# Patient Record
Sex: Female | Born: 1950 | Race: White | Hispanic: No | Smoking: Never smoker
Health system: Southern US, Community
[De-identification: ages and names within clinical notes are randomized; demographics above are authoritative.]

## PROBLEM LIST (undated history)

## (undated) DIAGNOSIS — E039 Hypothyroidism, unspecified: Secondary | ICD-10-CM

## (undated) DIAGNOSIS — J069 Acute upper respiratory infection, unspecified: Secondary | ICD-10-CM

## (undated) DIAGNOSIS — M1990 Unspecified osteoarthritis, unspecified site: Secondary | ICD-10-CM

## (undated) DIAGNOSIS — R232 Flushing: Secondary | ICD-10-CM

## (undated) DIAGNOSIS — C50919 Malignant neoplasm of unspecified site of unspecified female breast: Secondary | ICD-10-CM

## (undated) DIAGNOSIS — I739 Peripheral vascular disease, unspecified: Secondary | ICD-10-CM

## (undated) DIAGNOSIS — R51 Headache: Secondary | ICD-10-CM

## (undated) DIAGNOSIS — K432 Incisional hernia without obstruction or gangrene: Principal | ICD-10-CM

## (undated) DIAGNOSIS — C801 Malignant (primary) neoplasm, unspecified: Secondary | ICD-10-CM

## (undated) DIAGNOSIS — E785 Hyperlipidemia, unspecified: Secondary | ICD-10-CM

## (undated) DIAGNOSIS — K439 Ventral hernia without obstruction or gangrene: Secondary | ICD-10-CM

## (undated) DIAGNOSIS — K760 Fatty (change of) liver, not elsewhere classified: Secondary | ICD-10-CM

## (undated) DIAGNOSIS — Z8489 Family history of other specified conditions: Secondary | ICD-10-CM

## (undated) DIAGNOSIS — I4892 Unspecified atrial flutter: Secondary | ICD-10-CM

## (undated) DIAGNOSIS — E079 Disorder of thyroid, unspecified: Secondary | ICD-10-CM

## (undated) DIAGNOSIS — E119 Type 2 diabetes mellitus without complications: Secondary | ICD-10-CM

## (undated) DIAGNOSIS — Z923 Personal history of irradiation: Secondary | ICD-10-CM

## (undated) DIAGNOSIS — I10 Essential (primary) hypertension: Secondary | ICD-10-CM

## (undated) DIAGNOSIS — F0781 Postconcussional syndrome: Secondary | ICD-10-CM

## (undated) DIAGNOSIS — K219 Gastro-esophageal reflux disease without esophagitis: Secondary | ICD-10-CM

## (undated) DIAGNOSIS — I2699 Other pulmonary embolism without acute cor pulmonale: Secondary | ICD-10-CM

## (undated) DIAGNOSIS — J45909 Unspecified asthma, uncomplicated: Secondary | ICD-10-CM

## (undated) DIAGNOSIS — F419 Anxiety disorder, unspecified: Secondary | ICD-10-CM

## (undated) HISTORY — DX: Disorder of thyroid, unspecified: E07.9

## (undated) HISTORY — PX: CARPAL TUNNEL RELEASE: SHX101

## (undated) HISTORY — PX: BREAST SURGERY: SHX581

## (undated) HISTORY — PX: DIAGNOSTIC LAPAROSCOPY: SUR761

## (undated) HISTORY — PX: TARSAL TUNNEL RELEASE: SUR1099

## (undated) HISTORY — DX: Hyperlipidemia, unspecified: E78.5

## (undated) HISTORY — DX: Other pulmonary embolism without acute cor pulmonale: I26.99

## (undated) HISTORY — PX: BREAST BIOPSY: SHX20

## (undated) HISTORY — PX: JOINT REPLACEMENT: SHX530

## (undated) HISTORY — PX: COLONOSCOPY: SHX174

## (undated) HISTORY — DX: Incisional hernia without obstruction or gangrene: K43.2

## (undated) HISTORY — DX: Personal history of irradiation: Z92.3

## (undated) HISTORY — PX: THYROIDECTOMY, PARTIAL: SHX18

## (undated) HISTORY — DX: Ventral hernia without obstruction or gangrene: K43.9

## (undated) HISTORY — DX: Flushing: R23.2

## (undated) HISTORY — DX: Essential (primary) hypertension: I10

## (undated) HISTORY — DX: Postconcussional syndrome: F07.81

---

## 2000-07-24 HISTORY — PX: APPENDECTOMY: SHX54

## 2000-08-06 ENCOUNTER — Encounter (INDEPENDENT_AMBULATORY_CARE_PROVIDER_SITE_OTHER): Payer: Self-pay

## 2000-08-06 ENCOUNTER — Inpatient Hospital Stay (HOSPITAL_COMMUNITY): Admission: EM | Admit: 2000-08-06 | Discharge: 2000-08-10 | Payer: Self-pay | Admitting: *Deleted

## 2000-08-06 ENCOUNTER — Encounter: Payer: Self-pay | Admitting: Emergency Medicine

## 2004-07-24 HISTORY — PX: LAPAROSCOPIC CHOLECYSTECTOMY: SUR755

## 2004-08-19 ENCOUNTER — Emergency Department (HOSPITAL_COMMUNITY): Admission: EM | Admit: 2004-08-19 | Discharge: 2004-08-20 | Payer: Self-pay | Admitting: Emergency Medicine

## 2005-06-08 ENCOUNTER — Encounter: Admission: RE | Admit: 2005-06-08 | Discharge: 2005-06-08 | Payer: Self-pay | Admitting: Internal Medicine

## 2005-06-08 ENCOUNTER — Emergency Department (HOSPITAL_COMMUNITY): Admission: EM | Admit: 2005-06-08 | Discharge: 2005-06-08 | Payer: Self-pay | Admitting: Emergency Medicine

## 2005-06-09 ENCOUNTER — Ambulatory Visit (HOSPITAL_COMMUNITY): Admission: RE | Admit: 2005-06-09 | Discharge: 2005-06-09 | Payer: Self-pay | Admitting: General Surgery

## 2005-06-09 ENCOUNTER — Encounter (INDEPENDENT_AMBULATORY_CARE_PROVIDER_SITE_OTHER): Payer: Self-pay | Admitting: Specialist

## 2009-12-22 ENCOUNTER — Encounter: Admission: RE | Admit: 2009-12-22 | Discharge: 2009-12-22 | Payer: Self-pay | Admitting: Gastroenterology

## 2010-07-06 ENCOUNTER — Ambulatory Visit: Payer: Self-pay | Admitting: Vascular Surgery

## 2010-08-09 ENCOUNTER — Ambulatory Visit
Admission: RE | Admit: 2010-08-09 | Discharge: 2010-08-09 | Payer: Self-pay | Source: Home / Self Care | Attending: Vascular Surgery | Admitting: Vascular Surgery

## 2010-12-06 NOTE — Consult Note (Signed)
NEW PATIENT CONSULTATION   Alyssa [name], Alyssa [name]  DOB:  1951/04/06                                       07/06/2010  QZESP#:[id number]   Breann Losano presents today for evaluation of lower extremity venous  pathology.  She is a healthy 60-year white female who is concern  regarding increasing telangiectasia over both lower extremities.  These  are diffusely scattered but are most prominent anteriorly and are much  more prominent over both pretibial areas towards her ankle with some on  her anterior thighs.  This is worse on her right leg than her left leg.  She has no history of DVT or venous varicosities.  She does not have any  specific pain related to these.  She does have some aching with  prolonged standing and no significant swelling.   PAST MEDICAL HISTORY:  Significant for hypertension under control with  meds and also hypercholesterolemia which is also under control with  meds.   PAST SURGICAL HISTORY:  Significant for appendectomy, cholecystectomy,  tarsal tunnel in both feet and carpal tunnel as well.   SOCIAL HISTORY:  She is married, widowed with 2 children.  She is a  Pharmacist, hospital.  She does not smoke cigarettes, does have 1 glass of wine  weekly.   FAMILY HISTORY:  Significant for myocardial infarction resulting in  death of her father at age 80, also there is premature atherosclerotic  disease in her brother.  She has a severe venous hypertension in her  grandmother with changes of venous hypertension in her lower  extremities.   REVIEW OF SYSTEMS:  Positive for weight gain and otherwise negative.   PHYSICAL EXAMINATION:  Well-developed, well-nourished moderately obese  white female in no acute distress.  Blood pressure is 144/90, pulse 72, respirations 16.  She is in no acute  distress.  HEENT:  Normal.  ABDOMEN:  Soft, moderately obese, nontender.  MUSCULOSKELETAL:  Shows no major deformity or cyanosis.  NEUROLOGIC:  No focal weakness or  paresthesias.  SKIN:  Without ulcers or rashes.  She does have 2+ radial pulses and 2+ dorsalis pedis pulses bilaterally  .   She underwent imaging of her saphenous vein with SonoSite screen.  This  showed normal size saphenous vein with no reflux through her mid thigh  and also had no evidence of enlargement reflux in her small saphenous  vein bilaterally.   I had a long discussion with Ms. Releford and explained the significance  of this.  Her main concern is of these telangiectasia.  I reassured that  this is not an indication of more serious issues with venous  hypertension.  I did explain the treatment of these with sclerotherapy  in our office.  I explained this would be not covered by insurance and I  explained our expected out-of-pocket expenses.  She wishes to proceed  with this and will contact us when she would like to proceed with  sclerotherapy.  Her main concern is around the level of her ankles.  She  can be fitted with knee-high compression following the sclerotherapy  since treatment will be only from her knees down.     Rosetta Posner, M.D.  Electronically Signed   TFE/MEDQ  D:  07/06/2010  T:  07/07/2010  Job:  [id number]

## 2010-12-09 NOTE — Op Note (Signed)
Alyssa [name], Alyssa [name]                ACCOUNT NO.:  [id number]   MEDICAL RECORD NO.:  [id number]          PATIENT TYPE:  AMB   LOCATION:  DAY                          FACILITY:  [facility]   PHYSICIAN:  Kathrin Penner, M.D.   DATE OF BIRTH:  13-May-1951   DATE OF PROCEDURE:  06/09/2005  DATE OF DISCHARGE:                                 OPERATIVE REPORT   PREOPERATIVE DIAGNOSIS:  Chronic calculous cholecystitis.   POSTOPERATIVE DIAGNOSIS:  Chronic calculous cholecystitis.   PROCEDURE:  Laparoscopic cholecystectomy with intraoperative cholangiogram.   SURGEON:  Dr. Kathrin Penner.   ASSISTANT:  Dr. Annabell Sabal.   ANESTHESIA:  General.   SPECIMENS TO LAB:  Gallbladder with stones.   The patient returned to the PACU in excellent condition.   INDICATIONS FOR PROCEDURE:  Alyssa [name] is a 60 year old woman with chronic  abdominal and right shoulder pain associated only with mild nausea and with  no vomiting. Because of her persistent pain, she was evaluated by ultrasound  which showed multiple gallstones and a thick walled contracted gallbladder.  She comes to the operating room now after the risks and potential benefits  of surgery have been discussed. All questions answered, consent obtained.   PROCEDURE:  Following the induction of satisfactory general anesthesia, the  patient positioned supinely, the abdomen is prepped and draped to be  included in a sterile operative field. Open laparoscopy is created at the  umbilicus with insertion of a Hassan cannula and with insufflation of the  peritoneal cavity to 14 mmHg pressure using carbon dioxide. Camera inserted,  the abdomen is visually explored. The gallbladder was noted to be  chronically scarred with multiple stones. The liver edges were sharp, liver  surfaces smooth. The anterior gastric wall duodenal sweep appeared to be  normal and none of the small or large intestine viewed appeared to be  abnormal. Pelvic organs were surgically  absent.   Under direct vision, epigastric and lateral ports were placed and the  gallbladder was grasped and retracted cephalad. The patient had a very long  gallbladder extending down into the hepatoduodenal ligament with an  associated short cystic duct. There was significant scarring and chronic  inflammation around the entire gallbladder. Dissection was carried down to  the hepatoduodenal ligament along the ampulla of the gallbladder. The  patient had an anterior crossing cystic artery which was doubly clipped and  transected. The cystic duct was then isolated, clipped proximally and  opened. A cystic duct cholangiogram was carried out by placing a Cook  catheter into the cystic duct and through it injecting 1/2 strength Hypaque  into the extrahepatic biliary system. This was done under fluoroscopic  control with the resulting cholangiogram showing prompt flow of contrast  into the duodenum, normal caliber extrahepatic ducts with no filling  defects. The cholangiocatheter was then withdrawn, the cystic duct was  triply clipped and transected. The gallbladder was then dissected free from  the liver bed using electrocautery and maintaining hemostasis throughout the  entire course of the dissection. At the end of the dissection, the  gallbladder was placed in an  Endopouch. The right upper quadrant was  thoroughly irrigated with normal saline. Additional bleeding points were  treated with electrocautery. The camera was switched to the epigastric port  and the gallbladder retrieved through the umbilical port without difficulty.  It was also noted that the patient had a ventral hernia from a previous  lower abdominal surgery. I elected at that time to repair the ventral hernia  by using interrupted #1 Vicryl sutures to close the umbilical wound. The  skin was closed with 4-0 Monocryl. I used 4-0 Monocryl to close the  epigastric and lateral ports also. All wounds were reinforced with  Steri-  Strips, a sterile dressing was applied, the anesthetic reversed and the  patient removed from the operating room to the recovery room in stable  condition. She tolerated the procedure well.      Kathrin Penner, M.D.  Electronically Signed     PB/MEDQ  D:  06/09/2005  T:  06/09/2005  Job:  [id number]   cc:   Adriana Simas  Fax: [phone]

## 2010-12-09 NOTE — Op Note (Signed)
[facility]  Patient:    [name], Alyssa                       MRN: [id number] Proc. Date: 08/06/00 Adm. Date:  [id number] Attending:  Joya San CC:         Trellis Moment, D.O., [place], [place]   Operative Report  PREOPERATIVE DIAGNOSIS:  Acute appendicitis.  POSTOPERATIVE DIAGNOSIS:  Ruptured, walled-off pelvic appendicitis with abscess.  HISTORY:  Kyrie Bun is a 60 year old white female who was seen today, Monday, August 06, 2000, in the [place] ER with lower abdominal pain that started on Friday.  It got severe Saturday and really hurt that night.  She said she had a high fever of over 104 then.  Yesterday, she had band-like abdominal pain that progressively got worse.  She denied diarrhea and had not been eating anything since Friday.  She was seen by me, where her white count was noted to be elevated at 12,300, and a CT scan was obtained which showed nonfilling of the appendix with questionable mass and some fluid. Preoperative informed consent was obtained regarding laparoscopic appendectomy.  SURGEON:  Isabel Caprice. Hassell Done, M.D.  ANESTHESIA:  General endotracheal.  PROCEDURE:  Laparoscopic appendectomy.  DESCRIPTION OF PROCEDURE:  Patient was taken to OR #1 on Monday afternoon, August 06, 2000, and given general anesthesia.  Preoperatively, she had received Cefotan 1 g in the emergency department.  I went through the umbilicus through an old scar and placed a Hasson cannula without difficulty. The abdomen was insufflated and a 5 and then a 12 were placed in the right upper quadrant and left lower quadrant, respectively.  The cecum was noted to be down in the pelvis and it was stuck and with some blunt dissection, I mobilized a rotten appendix; it had perforated near the base.  The mesentery of the appendix, which is depicted in the chart, was filled with creamy white pus that expressed easily.  A markedly edematous inflamed  appendiceal mesentery was present.  I mobilized the base as best I could see and transected it with an endovascular GIA and then went across the mesentery with several applications of the endovascular GIA and then placed the appendix in a bag and brought it out through the umbilicus; I did enlarge my hole slightly at the umbilicus to accommodate this large swollen appendix in the bag.  I irrigated the base very well and then tried to control the release of the purulence with a sucker and attempted to minimize contamination.  I did irrigate well with two bags of saline.  No bleeding was seen from the base.  I then repaired the umbilical port with figure-of-eight suture of 0 Prolene.  A second simple suture was placed above this.  The wounds were irrigated and closed with 4-0 Vicryl.  The patient seemed to tolerate the procedure well and was taken to the recovery room in satisfactory condition.  She will be admitted. DD:  08/06/00 TD:  08/07/00 Job: [id number]

## 2010-12-09 NOTE — Discharge Summary (Signed)
[facility]  Patient:    Alyssa [name], Alyssa [name]                       MRN: [id number] Adm. Date:  [id number] Disc. Date: [id number] Attending:  Joya San                           Discharge Summary  ADMISSION DIAGNOSIS:  Appendicitis.  DISCHARGE DIAGNOSIS:  Ruptured appendix with abscess and pus.  PROCEDURE:  Laparoscopic appendectomy on August 06, 2000.  HOSPITAL COURSE:  This is a 60 year old lady who underwent the afore mentioned appendectomy.  She did spike a temperature of 104.3 which was not surprising considering the degree of inflammatory response.  She was advanced to clear liquids, and was ready for discharge on August 10, 2000, on Keflex and Flagyl for seven days.  Arrangements were made for her to return to the office at that period of time.  FINAL DIAGNOSIS:  Status post ruptured appendix and laparoscopic appendectomy. DD:  09/01/00 TD:  09/03/00 Job: [id number]

## 2011-07-25 DIAGNOSIS — I2699 Other pulmonary embolism without acute cor pulmonale: Secondary | ICD-10-CM

## 2011-07-25 HISTORY — DX: Other pulmonary embolism without acute cor pulmonale: I26.99

## 2011-08-07 ENCOUNTER — Ambulatory Visit (INDEPENDENT_AMBULATORY_CARE_PROVIDER_SITE_OTHER): Payer: BC Managed Care – PPO | Admitting: Surgery

## 2011-08-07 ENCOUNTER — Encounter (INDEPENDENT_AMBULATORY_CARE_PROVIDER_SITE_OTHER): Payer: Self-pay | Admitting: Surgery

## 2011-08-07 VITALS — BP 146/80 | HR 68 | Temp 98.1°F | Resp 16 | Ht 62.0 in | Wt 194.2 lb

## 2011-08-07 DIAGNOSIS — E669 Obesity, unspecified: Secondary | ICD-10-CM | POA: Insufficient documentation

## 2011-08-07 DIAGNOSIS — F0781 Postconcussional syndrome: Secondary | ICD-10-CM | POA: Insufficient documentation

## 2011-08-07 DIAGNOSIS — K432 Incisional hernia without obstruction or gangrene: Secondary | ICD-10-CM

## 2011-08-07 HISTORY — DX: Incisional hernia without obstruction or gangrene: K43.2

## 2011-08-07 NOTE — Progress Notes (Signed)
Subjective:     Patient ID: Alyssa [name], female   DOB: 1951-06-10, 61 y.o.   MRN: [id number]  HPI  Alyssa [name]  [birth year] [id number]  Patient Care Team: Maxine Glenn. Nolon Rod as PCP - General (Family Medicine) Juanita Craver, MD as Consulting Physician (Gastroenterology)  This patient is a 61 y.o.female who presents today for surgical evaluation at the request of Dr. Nolon Rod.   Reason for visit: Painful hernia at old incisions near her belly button  The patient is a pleasant obese female who has had intermittent abdominal pains. She describes some sharp tenderness near the right upper bellybutton occasionally the up into the left and almost always right central now. She saw Dr. Collene Mares with gastroenterology in the past. Workup did not reveal a bowel obstruction. A CT enterrhogrram in June2011 did note a hernia near the bellybutton. She's been aware of her hernia around her. It had not been than bothersome. Recently, it has become more sore. She is using Ultram to help manage it. She claims to have a high threshold for pain.  However it has gotten worse. She wishes to consider surgery now.  Bowel movement every day. No nausea or vomiting. Regular activity. No wound infections. No fevers chills or sweats. Pretty good exercise tolerance  Patient Active Problem List  Diagnoses  . Hernia, incisional periumbilical, incarcerated  . Obesity (BMI 30-39.9)  . Post concussion syndrome    Past Medical History  Diagnosis Date  . Hyperlipidemia   . Hypertension   . Thyroid disease   . Abdominal pain   . Post concussion syndrome   . Ventral hernia     Past Surgical History  Procedure Date  . Cholecystectomy 2006    lap chole, Dr. Bubba Camp  . Appendectomy 2002    lap appy.  Dr. Hassell Done  . Carpel tunnel     left in April 2011, right in June 2012    History   Social History  . Marital Status: Divorced    Spouse Name: N/A    Number of Children: N/A  . Years of Education: N/A    Occupational History  . Not on file.   Social History Main Topics  . Smoking status: Never Smoker   . Smokeless tobacco: Never Used  . Alcohol Use: Yes     occasional  . Drug Use: No  . Sexually Active: Not on file   Other Topics Concern  . Not on file   Social History Narrative  . No narrative on file    History reviewed. No pertinent family history.  Current outpatient prescriptions:divalproex (DEPAKOTE) 500 MG DR tablet, Take 500 mg by mouth daily., Disp: , Rfl: ;  hydrochlorothiazide (HYDRODIURIL) 25 MG tablet, Take 25 mg by mouth daily., Disp: , Rfl: ;  rosuvastatin (CRESTOR) 10 MG tablet, Take 10 mg by mouth daily., Disp: , Rfl: ;  traMADol (ULTRAM) 50 MG tablet, Take 50-100 mg by mouth every 6 (six) hours as needed. For abdominal / hernia pain, Disp: , Rfl:  traZODone (DESYREL) 50 MG tablet, Take 50 mg by mouth at bedtime., Disp: , Rfl:   Allergies  Allergen Reactions  . Penicillins Hives, Swelling and Rash    All over the body.  . Percocet (Oxycodone-Acetaminophen) Other (See Comments)    Causes syncope day after medication has been taken.  . Vicodin (Hydrocodone-Acetaminophen) Other (See Comments)    Causes syncope day after medication has been taken.    BP 146/80  Pulse 68  Temp(Src) 98.1  F (36.7 C) (Temporal)  Resp 16  Ht 5' 2"  (1.575 m)  Wt 194 lb 3.2 oz (88.089 kg)  BMI 35.52 kg/m2     Review of Systems  Constitutional: Negative for fever, chills, diaphoresis, appetite change and fatigue.  HENT: Negative for ear pain, sore throat, trouble swallowing, neck pain and ear discharge.   Eyes: Negative for photophobia, discharge and visual disturbance.  Respiratory: Negative for cough, choking, chest tightness and shortness of breath.   Cardiovascular: Negative for chest pain and palpitations.  Gastrointestinal: Positive for abdominal pain. Negative for nausea, vomiting, diarrhea, constipation, blood in stool, abdominal distention, anal bleeding and  rectal pain.  Genitourinary: Negative for dysuria, frequency and difficulty urinating.  Musculoskeletal: Negative for myalgias and gait problem.  Skin: Negative for color change, pallor and rash.  Neurological: Negative for dizziness, speech difficulty, weakness and numbness.  Hematological: Negative for adenopathy.  Psychiatric/Behavioral: Negative for confusion, dysphoric mood and agitation. The patient is not nervous/anxious.        Occasional difficulty w recalling dates, times, etc (h/o concussion)       Objective:   Physical Exam  Constitutional: She is oriented to person, place, and time. She appears well-developed and well-nourished. No distress.  HENT:  Head: Normocephalic.  Mouth/Throat: Oropharynx is clear and moist. No oropharyngeal exudate.  Eyes: Conjunctivae and EOM are normal. Pupils are equal, round, and reactive to light. No scleral icterus.  Neck: Normal range of motion. Neck supple. No tracheal deviation present.  Cardiovascular: Normal rate, regular rhythm and intact distal pulses.   Pulmonary/Chest: Effort normal and breath sounds normal. No respiratory distress. She exhibits no tenderness.  Abdominal: Soft. She exhibits no distension. There is tenderness. There is no rebound and no guarding. Hernia confirmed negative in the right inguinal area and confirmed negative in the left inguinal area.       Incarcerated mass RLQ side of bellybutton 6x6cm round.  Old midline incision - no definite VWH there  Genitourinary: No vaginal discharge found.  Musculoskeletal: Normal range of motion. She exhibits no tenderness.  Lymphadenopathy:    She has no cervical adenopathy.       Right: No inguinal adenopathy present.       Left: No inguinal adenopathy present.  Neurological: She is alert and oriented to person, place, and time. No cranial nerve deficit. She exhibits normal muscle tone. Coordination normal.  Skin: Skin is warm and dry. No rash noted. She is not diaphoretic.  No erythema.  Psychiatric: She has a normal mood and affect. Her behavior is normal. Judgment and thought content normal.       Assessment:     Incarcerated [place]    Plan:     I think she would benefit from repair.   I would like to do this laparoscopically.    The anatomy & physiology of the abdominal wall was discussed.  The pathophysiology of hernias was discussed.  Natural history risks without surgery of enlargement, pain, incarceration & strangulation was discussed.   Contributors to complications such as smoking, obesity, diabetes, prior surgery, etc were discussed.  I feel the risks of no intervention will lead to serious problems that outweigh the operative risks; therefore, I recommended surgery to reduce and repair the hernia.  I explained laparoscopic techniques with possible need for an open approach.  I noted the probable use of mesh to patch and/or buttress hernia repair  Risks such as bleeding, infection, abscess, need for further treatment, heart attack, death, and other  risks were discussed.  I noted a good likelihood this will help address the problem.   Goals of post-operative recovery were discussed as well.  Possibility that this will not correct all symptoms was explained.  I stressed the importance of low-impact activity, aggressive pain control, avoiding constipation, & not pushing through pain to minimize risk of post-operative chronic pain or injury. Possibility of reherniation was discussed.  We will work to minimize complications.   An educational handout further explaining the pathology & treatment options was given as well.  Questions were answered.  The patient expresses understanding & wishes to proceed with surgery.  She was hoping to go back to work the following week. I cautioned her that this is the source surgery and takes weeks if not months to recover from. I recommended she have the ability to have a substitute teacher to help cover for her for a few weeks at  least. She is hoping to fly to a conference next month as well. I caution that I did not know that was feasible that soon after surgery. She claims she had a friend with her it would be safe. We will try to coordinate this and a convenient time.

## 2011-08-07 NOTE — Patient Instructions (Signed)
Hernia A hernia occurs when an internal organ pushes out through a weak spot in the abdominal wall. Hernias most commonly occur in the groin and around the navel. Hernias often can be pushed back into place (reduced). Most hernias tend to get worse over time. Some abdominal hernias can get stuck in the opening (irreducible or incarcerated hernia) and cannot be reduced. An irreducible abdominal hernia which is tightly squeezed into the opening is at risk for impaired blood supply (strangulated hernia). A strangulated hernia is a medical emergency. Because of the risk for an irreducible or strangulated hernia, surgery may be recommended to repair a hernia. CAUSES   Heavy lifting.   Prolonged coughing.   Straining to have a bowel movement.   A cut (incision) made during an abdominal surgery.  HOME CARE INSTRUCTIONS   Bed rest is not required. You may continue your normal activities.   Avoid lifting more than 10 pounds (4.5 kg) or straining.   Cough gently. If you are a smoker it is best to stop. Even the best hernia repair can break down with the continual strain of coughing. Even if you do not have your hernia repaired, a cough will continue to aggravate the problem.   Do not wear anything tight over your hernia. Do not try to keep it in with an outside bandage or truss. These can damage abdominal contents if they are trapped within the hernia sac.   Eat a normal diet.   Avoid constipation. Straining over long periods of time will increase hernia size and encourage breakdown of repairs. If you cannot do this with diet alone, stool softeners may be used.  SEEK IMMEDIATE MEDICAL CARE IF:   You have a fever.   You develop increasing abdominal pain.   You feel nauseous or vomit.   Your hernia is stuck outside the abdomen, looks discolored, feels hard, or is tender.   You have any changes in your bowel habits or in the hernia that are unusual for you.   You have increased pain or  swelling around the hernia.   You cannot push the hernia back in place by applying gentle pressure while lying down.  MAKE SURE YOU:   Understand these instructions.   Will watch your condition.   Will get help right away if you are not doing well or get worse.  Document Released: 07/10/2005 Document Revised: 03/22/2011 Document Reviewed: 02/27/2008 [facility] Patient Information 2012 [place].

## 2011-08-21 ENCOUNTER — Telehealth (INDEPENDENT_AMBULATORY_CARE_PROVIDER_SITE_OTHER): Payer: Self-pay

## 2011-08-21 ENCOUNTER — Encounter (INDEPENDENT_AMBULATORY_CARE_PROVIDER_SITE_OTHER): Payer: Self-pay

## 2011-08-21 NOTE — Telephone Encounter (Signed)
Returned pt's call but lmom stating that I would get her a note to her job today about her having surgery on 09-15-11. The note will release her to go back to work on approx. 10-09-11. If pt has any questions to call me b /c I will fax the note to [phone] per her request this pm.

## 2011-09-11 ENCOUNTER — Encounter (HOSPITAL_COMMUNITY): Payer: Self-pay | Admitting: Pharmacy Technician

## 2011-09-12 ENCOUNTER — Encounter (HOSPITAL_COMMUNITY): Payer: Self-pay

## 2011-09-12 ENCOUNTER — Encounter (HOSPITAL_COMMUNITY)
Admission: RE | Admit: 2011-09-12 | Discharge: 2011-09-12 | Disposition: A | Discharge status: Home / Self Care | Payer: BC Managed Care – PPO | Source: Ambulatory Visit | Attending: Surgery | Admitting: Surgery

## 2011-09-12 ENCOUNTER — Ambulatory Visit (HOSPITAL_COMMUNITY)
Admission: RE | Admit: 2011-09-12 | Discharge: 2011-09-12 | Disposition: A | Discharge status: Home / Self Care | Payer: BC Managed Care – PPO | Source: Ambulatory Visit | Attending: Surgery | Admitting: Surgery

## 2011-09-12 ENCOUNTER — Other Ambulatory Visit: Payer: Self-pay

## 2011-09-12 DIAGNOSIS — Z01810 Encounter for preprocedural cardiovascular examination: Secondary | ICD-10-CM | POA: Insufficient documentation

## 2011-09-12 DIAGNOSIS — I10 Essential (primary) hypertension: Secondary | ICD-10-CM | POA: Insufficient documentation

## 2011-09-12 DIAGNOSIS — E039 Hypothyroidism, unspecified: Secondary | ICD-10-CM | POA: Insufficient documentation

## 2011-09-12 DIAGNOSIS — Z01818 Encounter for other preprocedural examination: Secondary | ICD-10-CM | POA: Insufficient documentation

## 2011-09-12 DIAGNOSIS — Z01812 Encounter for preprocedural laboratory examination: Secondary | ICD-10-CM | POA: Insufficient documentation

## 2011-09-12 DIAGNOSIS — K436 Other and unspecified ventral hernia with obstruction, without gangrene: Secondary | ICD-10-CM | POA: Insufficient documentation

## 2011-09-12 HISTORY — DX: Headache: R51

## 2011-09-12 HISTORY — DX: Hypothyroidism, unspecified: E03.9

## 2011-09-12 HISTORY — DX: Acute upper respiratory infection, unspecified: J06.9

## 2011-09-12 LAB — CBC
MCH: 27.9 pg (ref 26.0–34.0)
MCV: 86.5 fL (ref 78.0–100.0)
Platelets: 319 10*3/uL (ref 150–400)
RBC: 4.51 MIL/uL (ref 3.87–5.11)
WBC: 6.6 10*3/uL (ref 4.0–10.5)

## 2011-09-12 LAB — SURGICAL PCR SCREEN: MRSA, PCR: NEGATIVE

## 2011-09-12 LAB — BASIC METABOLIC PANEL
BUN: 18 mg/dL (ref 6–23)
CO2: 36 mEq/L — ABNORMAL HIGH (ref 19–32)
Calcium: 9.8 mg/dL (ref 8.4–10.5)
Chloride: 98 mEq/L (ref 96–112)
GFR calc Af Amer: >90 mL/min (ref >90)
GFR calc non Af Amer: >90 mL/min (ref >90)
Potassium: 3.8 mEq/L (ref 3.5–5.1)
Sodium: 141 mEq/L (ref 135–145)

## 2011-09-12 NOTE — Patient Instructions (Signed)
20 SELAH KLANG  09/12/2011   Your procedure is scheduled on:  09/15/11  Surgery 0730-1000   Friday  Report to [facility] at   [place]    AM.  Call this number if you have problems the morning of surgery: [phone]     Or PST   [phone]  [facility]   Remember:   Do not eat food:After Midnight.none after Thursday night midnight  May have clear liquids: none after midnight Thurs night  Clear liquids include soda, tea, black coffee, apple or grape juice, broth.  Take these medicines the morning of surgery with A SIP OF WATER:Tramadol with sip water if needed   Do not wear jewelry, make-up or nail polish.  Do not wear lotions, powders, or perfumes. You may wear deodorant.  Do not shave 48 hours prior to surgery.  Do not bring valuables to the hospital.  Contacts, dentures or bridgework may not be worn into surgery.  Leave suitcase in the car. After surgery it may be brought to your room.  For patients admitted to the hospital, checkout time is 11:00 AM the day of discharge.   Patients discharged the day of surgery will not be allowed to drive home.  Name and phone number of your driver:      Jovita Kussmaul                                                                Special Instructions: CHG Shower Use Special Wash: 1/2 bottle night before surgery and 1/2 bottle morning of surgery. REGULAR SOAP FACE AND PRIVATES              LADIES- NO SHAVING 59 HOURS BEFORE USING BETASEPT SOAP.                 Please read over the following fact sheets that you were given: MRSA Information

## 2011-09-14 ENCOUNTER — Encounter (INDEPENDENT_AMBULATORY_CARE_PROVIDER_SITE_OTHER): Payer: Self-pay

## 2011-09-15 ENCOUNTER — Ambulatory Visit (HOSPITAL_COMMUNITY): Admission: AD | Admit: 2011-09-15 | Payer: BC Managed Care – PPO | Source: Ambulatory Visit | Admitting: Surgery

## 2011-09-15 ENCOUNTER — Encounter (HOSPITAL_COMMUNITY): Admission: AD | Payer: Self-pay | Source: Ambulatory Visit

## 2011-09-15 HISTORY — PX: HERNIA REPAIR: SHX51

## 2011-09-15 SURGERY — REPAIR, HERNIA, VENTRAL, LAPAROSCOPIC
Anesthesia: General

## 2011-09-22 NOTE — Pre-Procedure Instructions (Signed)
09/22/11- LVM CELL PHONE with verification call requested of arrival surgery day 09/29/11 at 0515 short stay and same instructions regarding meds am of surgery. Reminder of NPO after  midnight Thurs night.  Received left voice mail that she received instruction time and to follow same guidelines for care before surgery as was given at PST appt

## 2011-09-28 ENCOUNTER — Encounter (HOSPITAL_COMMUNITY): Payer: Self-pay | Admitting: Anesthesiology

## 2011-09-28 NOTE — Anesthesia Preprocedure Evaluation (Addendum)
Anesthesia Evaluation  Patient identified by MRN, date of birth, ID band Patient awake    Reviewed: Allergy & Precautions, H&P , NPO status , Patient's Chart, lab work & pertinent test results  Airway Mallampati: II TM Distance: >3 FB Neck ROM: Full    Dental No notable dental hx.    Pulmonary Recent URI ,  CXR: No acute disease. breath sounds clear to auscultation  Pulmonary exam normal       Cardiovascular hypertension, Pt. on medications Rhythm:Regular Rate:Normal  ECG: SB (59)   Neuro/Psych  Headaches, PSYCHIATRIC DISORDERS Postconcussion syndrome.    GI/Hepatic negative GI ROS, Neg liver ROS,   Endo/Other  Hypothyroidism   Renal/GU negative Renal ROS  negative genitourinary   Musculoskeletal negative musculoskeletal ROS (+)   Abdominal (+) + obese,   Peds negative pediatric ROS (+)  Hematology negative hematology ROS (+)   Anesthesia Other Findings   Reproductive/Obstetrics negative OB ROS                          Anesthesia Physical Anesthesia Plan  ASA: II  Anesthesia Plan: General   Post-op Pain Management:    Induction: Intravenous  Airway Management Planned: Oral ETT  Additional Equipment:   Intra-op Plan:   Post-operative Plan: Extubation in OR  Informed Consent: I have reviewed the patients History and Physical, chart, labs and discussed the procedure including the risks, benefits and alternatives for the proposed anesthesia with the patient or authorized representative who has indicated his/her understanding and acceptance.   Dental advisory given  Plan Discussed with: CRNA  Anesthesia Plan Comments:         Anesthesia Quick Evaluation

## 2011-09-29 ENCOUNTER — Ambulatory Visit (HOSPITAL_COMMUNITY): Payer: BC Managed Care – PPO | Admitting: Anesthesiology

## 2011-09-29 ENCOUNTER — Encounter (HOSPITAL_COMMUNITY): Payer: Self-pay | Admitting: *Deleted

## 2011-09-29 ENCOUNTER — Ambulatory Visit (HOSPITAL_COMMUNITY)
Admission: RE | Admit: 2011-09-29 | Discharge: 2011-09-29 | Disposition: A | Discharge status: Home / Self Care | Payer: BC Managed Care – PPO | Source: Ambulatory Visit | Attending: Surgery | Admitting: Surgery

## 2011-09-29 ENCOUNTER — Encounter (HOSPITAL_COMMUNITY): Payer: Self-pay | Admitting: Anesthesiology

## 2011-09-29 ENCOUNTER — Encounter (HOSPITAL_COMMUNITY)
Admission: RE | Disposition: A | Discharge status: Home / Self Care | Payer: Self-pay | Source: Ambulatory Visit | Attending: Surgery

## 2011-09-29 DIAGNOSIS — K430 Incisional hernia with obstruction, without gangrene: Secondary | ICD-10-CM

## 2011-09-29 DIAGNOSIS — K432 Incisional hernia without obstruction or gangrene: Secondary | ICD-10-CM | POA: Insufficient documentation

## 2011-09-29 DIAGNOSIS — F0781 Postconcussional syndrome: Secondary | ICD-10-CM | POA: Insufficient documentation

## 2011-09-29 DIAGNOSIS — E039 Hypothyroidism, unspecified: Secondary | ICD-10-CM | POA: Insufficient documentation

## 2011-09-29 DIAGNOSIS — E669 Obesity, unspecified: Secondary | ICD-10-CM | POA: Insufficient documentation

## 2011-09-29 DIAGNOSIS — Z6830 Body mass index (BMI) 30.0-30.9, adult: Secondary | ICD-10-CM | POA: Insufficient documentation

## 2011-09-29 HISTORY — PX: VENTRAL HERNIA REPAIR: SHX424

## 2011-09-29 SURGERY — REPAIR, HERNIA, VENTRAL, LAPAROSCOPIC
Anesthesia: General | Site: Abdomen | Wound class: Clean

## 2011-09-29 MED ORDER — KETOROLAC TROMETHAMINE 30 MG/ML IJ SOLN
INTRAMUSCULAR | Status: DC | PRN
  Administered 2011-09-29: 30 mg via INTRAVENOUS

## 2011-09-29 MED ORDER — ACETAMINOPHEN 650 MG RE SUPP
650.0000 mg | RECTAL | Status: DC | PRN
  Filled 2011-09-29: qty 1

## 2011-09-29 MED ORDER — SUCCINYLCHOLINE CHLORIDE 20 MG/ML IJ SOLN
INTRAMUSCULAR | Status: DC | PRN
  Administered 2011-09-29: 100 mg via INTRAVENOUS

## 2011-09-29 MED ORDER — SODIUM CHLORIDE 0.9 % IV SOLN: 250.0000 mL | INTRAVENOUS | Status: DC | PRN

## 2011-09-29 MED ORDER — ACETAMINOPHEN 325 MG PO TABS: 650.0000 mg | ORAL_TABLET | ORAL | Status: DC | PRN

## 2011-09-29 MED ORDER — TRAMADOL HCL 50 MG PO TABS
50.0000 mg | ORAL_TABLET | Freq: Four times a day (QID) | ORAL | Status: DC | PRN
  Administered 2011-09-29 (×2): 50 mg via ORAL
  Filled 2011-09-29 (×2): qty 2

## 2011-09-29 MED ORDER — GENTAMICIN IN SALINE 1-0.9 MG/ML-% IV SOLN
100.0000 mg | INTRAVENOUS | Status: AC
  Administered 2011-09-29: 100 mg via INTRAVENOUS
  Filled 2011-09-29: qty 100

## 2011-09-29 MED ORDER — GLYCOPYRROLATE 0.2 MG/ML IJ SOLN
INTRAMUSCULAR | Status: DC | PRN
  Administered 2011-09-29: .5 mg via INTRAVENOUS

## 2011-09-29 MED ORDER — LACTATED RINGERS IV SOLN
INTRAVENOUS | Status: DC | PRN
  Administered 2011-09-29 (×2): via INTRAVENOUS

## 2011-09-29 MED ORDER — STERILE WATER FOR IRRIGATION IR SOLN
Status: DC | PRN
  Administered 2011-09-29: 1000 mL

## 2011-09-29 MED ORDER — ROCURONIUM BROMIDE 100 MG/10ML IV SOLN
INTRAVENOUS | Status: DC | PRN
  Administered 2011-09-29: 20 mg via INTRAVENOUS
  Administered 2011-09-29: 10 mg via INTRAVENOUS
  Administered 2011-09-29: 5 mg via INTRAVENOUS

## 2011-09-29 MED ORDER — PROPOFOL 10 MG/ML IV BOLUS
INTRAVENOUS | Status: DC | PRN
  Administered 2011-09-29: 100 mg via INTRAVENOUS

## 2011-09-29 MED ORDER — CLINDAMYCIN PHOSPHATE 900 MG/50ML IV SOLN
900.0000 mg | INTRAVENOUS | Status: AC
  Administered 2011-09-29: 900 mg via INTRAVENOUS
  Filled 2011-09-29: qty 50

## 2011-09-29 MED ORDER — FENTANYL CITRATE 0.05 MG/ML IJ SOLN
INTRAMUSCULAR | Status: DC | PRN
  Administered 2011-09-29 (×3): 50 ug via INTRAVENOUS
  Administered 2011-09-29: 100 ug via INTRAVENOUS

## 2011-09-29 MED ORDER — ONDANSETRON HCL 4 MG/2ML IJ SOLN
INTRAMUSCULAR | Status: DC | PRN
  Administered 2011-09-29: 4 mg via INTRAVENOUS

## 2011-09-29 MED ORDER — ONDANSETRON HCL 4 MG/2ML IJ SOLN: 4.0000 mg | Freq: Four times a day (QID) | INTRAMUSCULAR | Status: DC | PRN

## 2011-09-29 MED ORDER — MIDAZOLAM HCL 5 MG/5ML IJ SOLN
INTRAMUSCULAR | Status: DC | PRN
  Administered 2011-09-29: 2 mg via INTRAVENOUS

## 2011-09-29 MED ORDER — LACTATED RINGERS IR SOLN
Status: DC | PRN
  Administered 2011-09-29: 1000 mL

## 2011-09-29 MED ORDER — BUPIVACAINE-EPINEPHRINE 0.25% -1:200000 IJ SOLN
INTRAMUSCULAR | Status: DC | PRN
  Administered 2011-09-29: 50 mL

## 2011-09-29 MED ORDER — BUPIVACAINE HCL (PF) 0.25 % IJ SOLN
INTRAMUSCULAR | Status: DC | PRN
  Administered 2011-09-29: 09:00:00

## 2011-09-29 MED ORDER — TRAMADOL HCL 50 MG PO TABS: 50.0000 mg | ORAL_TABLET | Freq: Four times a day (QID) | ORAL | Status: AC | PRN

## 2011-09-29 MED ORDER — BUPIVACAINE-EPINEPHRINE 0.25% -1:200000 IJ SOLN
INTRAMUSCULAR | Status: AC
  Filled 2011-09-29: qty 1

## 2011-09-29 MED ORDER — BUPIVACAINE 0.25 % ON-Q PUMP DUAL CATH 300 ML
300.0000 mL | INJECTION | Status: DC
  Filled 2011-09-29: qty 300

## 2011-09-29 MED ORDER — CLINDAMYCIN PHOSPHATE 600 MG/50ML IV SOLN
INTRAVENOUS | Status: AC
  Filled 2011-09-29: qty 50

## 2011-09-29 MED ORDER — NEOSTIGMINE METHYLSULFATE 1 MG/ML IJ SOLN
INTRAMUSCULAR | Status: DC | PRN
  Administered 2011-09-29: 4 mg via INTRAVENOUS

## 2011-09-29 MED ORDER — HYDROMORPHONE HCL PF 1 MG/ML IJ SOLN
INTRAMUSCULAR | Status: AC
  Filled 2011-09-29: qty 1

## 2011-09-29 MED ORDER — HYDROMORPHONE HCL PF 1 MG/ML IJ SOLN
0.2500 mg | INTRAMUSCULAR | Status: DC | PRN
  Administered 2011-09-29: 0.25 mg via INTRAVENOUS

## 2011-09-29 SURGICAL SUPPLY — 47 items
APPLIER CLIP 5 13 M/L LIGAMAX5 (MISCELLANEOUS)
APR CLP MED LRG 5 ANG JAW (MISCELLANEOUS)
BINDER ABD UNIV 12 45-62 (WOUND CARE) IMPLANT
BINDER ABDOMINAL 46IN 62IN (WOUND CARE) ×2
CANISTER SUCTION 2500CC (MISCELLANEOUS) ×2 IMPLANT
CATH KIT ON Q 7.5IN SLV (PAIN MANAGEMENT) ×2 IMPLANT
CLIP APPLIE 5 13 M/L LIGAMAX5 (MISCELLANEOUS) IMPLANT
CLOTH BEACON ORANGE TIMEOUT ST (SAFETY) ×2 IMPLANT
DECANTER SPIKE VIAL GLASS SM (MISCELLANEOUS) ×2 IMPLANT
DEVICE SECURE STRAP 25 ABSORB (INSTRUMENTS) ×1 IMPLANT
DEVICE TROCAR PUNCTURE CLOSURE (ENDOMECHANICALS) ×1 IMPLANT
DRAPE LAPAROSCOPIC ABDOMINAL (DRAPES) ×2 IMPLANT
DRSG TEGADERM 2-3/8X2-3/4 SM (GAUZE/BANDAGES/DRESSINGS) ×6 IMPLANT
ELECT REM PT RETURN 9FT ADLT (ELECTROSURGICAL) ×2
ELECTRODE REM PT RTRN 9FT ADLT (ELECTROSURGICAL) ×1 IMPLANT
FILTER SMOKE EVAC LAPAROSHD (FILTER) IMPLANT
GAUZE SPONGE 2X2 8PLY STRL LF (GAUZE/BANDAGES/DRESSINGS) IMPLANT
GLOVE ECLIPSE 8.0 STRL XLNG CF (GLOVE) ×2 IMPLANT
GLOVE INDICATOR 8.0 STRL GRN (GLOVE) ×4 IMPLANT
GOWN STRL NON-REIN LRG LVL3 (GOWN DISPOSABLE) ×3 IMPLANT
GOWN STRL REIN XL XLG (GOWN DISPOSABLE) ×5 IMPLANT
HAND ACTIVATED (MISCELLANEOUS) IMPLANT
KIT BASIN OR (CUSTOM PROCEDURE TRAY) ×2 IMPLANT
MESH VENTRALIGHT ST 6X8 (Mesh Specialty) ×2 IMPLANT
MESH VENTRLGHT ELLIPSE 8X6XMFL (Mesh Specialty) IMPLANT
NDL SPNL 22GX3.5 QUINCKE BK (NEEDLE) IMPLANT
NEEDLE SPNL 22GX3.5 QUINCKE BK (NEEDLE) ×2 IMPLANT
NS IRRIG 1000ML POUR BTL (IV SOLUTION) ×2 IMPLANT
PEN SKIN MARKING BROAD (MISCELLANEOUS) ×2 IMPLANT
PENCIL BUTTON HOLSTER BLD 10FT (ELECTRODE) IMPLANT
SCISSORS LAP 5X35 DISP (ENDOMECHANICALS) ×2 IMPLANT
SET IRRIG TUBING LAPAROSCOPIC (IRRIGATION / IRRIGATOR) ×1 IMPLANT
SLEEVE Z-THREAD 5X100MM (TROCAR) ×2 IMPLANT
SPONGE GAUZE 2X2 STER 10/PKG (GAUZE/BANDAGES/DRESSINGS) ×1
STRIP CLOSURE SKIN 1/2X4 (GAUZE/BANDAGES/DRESSINGS) ×2 IMPLANT
SUT MNCRL AB 4-0 PS2 18 (SUTURE) ×2 IMPLANT
SUT PDS AB 1 CTX 36 (SUTURE) ×4 IMPLANT
SUT PROLENE 1 CT 1 30 (SUTURE) ×8 IMPLANT
SUT VIC AB 2-0 UR6 27 (SUTURE) IMPLANT
TOWEL OR 17X26 10 PK STRL BLUE (TOWEL DISPOSABLE) ×2 IMPLANT
TRAY FOLEY CATH 14FRSI W/METER (CATHETERS) ×1 IMPLANT
TRAY LAP CHOLE (CUSTOM PROCEDURE TRAY) ×2 IMPLANT
TROCAR XCEL BLADELESS 5X75MML (TROCAR) ×1 IMPLANT
TROCAR Z-THREAD FIOS 11X100 BL (TROCAR) ×2 IMPLANT
TROCAR Z-THREAD FIOS 5X100MM (TROCAR) ×2 IMPLANT
TUBING INSUFFLATION 10FT LAP (TUBING) ×2 IMPLANT
TUNNELER SHEATH ON-Q 16GX12 DP (PAIN MANAGEMENT) ×1 IMPLANT

## 2011-09-29 NOTE — Preoperative (Signed)
Beta Blockers   Reason not to administer Beta Blockers:Not Applicable 

## 2011-09-29 NOTE — Transfer of Care (Signed)
Immediate Anesthesia Transfer of Care Note  Patient: Alyssa [name]  Procedure(s) Performed: Procedure(s) (LRB): LAPAROSCOPIC VENTRAL HERNIA (N/A) INSERTION OF MESH (N/A)  Patient Location: PACU  Anesthesia Type: General  Level of Consciousness: awake and patient cooperative  Airway & Oxygen Therapy: Patient Spontanous Breathing and Patient connected to face mask oxygen  Post-op Assessment: Report given to PACU RN and Post -op Vital signs reviewed and stable  Post vital signs: Reviewed and stable  Complications: No apparent anesthesia complications

## 2011-09-29 NOTE — Discharge Instructions (Signed)
HERNIA REPAIR: POST OP INSTRUCTIONS  1. DIET: Follow a light bland diet the first 24 hours after arrival home, such as soup, liquids, crackers, etc.  Be sure to include lots of fluids daily.  Avoid fast food or heavy meals as your are more likely to get nauseated.  Eat a low fat the next few days after surgery. 2. Take your usually prescribed home medications unless otherwise directed. 3. PAIN CONTROL: a. Pain is best controlled by a usual combination of three different methods TOGETHER: i. Ice/Heat ii. Over the counter pain medication iii. Prescription pain medication b. Most patients will experience some swelling and bruising around the hernia(s) such as the bellybutton, groins, or old incisions.  Ice packs or heating pads (30-60 minutes up to 6 times a day) will help. Use ice for the first few days to help decrease swelling and bruising, then switch to heat to help relax tight/sore spots and speed recovery.  Some people prefer to use ice alone, heat alone, alternating between ice & heat.  Experiment to what works for you.  Swelling and bruising can take several weeks to resolve.   c. It is helpful to take an over-the-counter pain medication regularly for the first few weeks.  Choose one of the following that works best for you: i. Naproxen (Aleve, etc)  Two 212m tabs twice a day ii. Ibuprofen (Advil, etc) Three 2059mtabs four times a day (every meal & bedtime) iii. Acetaminophen (Tylenol, etc) 325-65032mour times a day (every meal & bedtime) d. A  prescription for pain medication should be given to you upon discharge.  Take your pain medication as prescribed.  i. If you are having problems/concerns with the prescription medicine (does not control pain, nausea, vomiting, rash, itching, etc), please call us [phone] see if we need to switch you to a different pain medicine that will work better for you and/or control your side effect better. ii. If you need a refill on your pain  medication, please contact your pharmacy.  They will contact our office to request authorization. Prescriptions will not be filled after 5 pm or on week-ends. 4. Avoid getting constipated.  Between the surgery and the pain medications, it is common to experience some constipation.  Increasing fluid intake and taking a fiber supplement (such as Metamucil, Citrucel, FiberCon, MiraLax, etc) 1-2 times a day regularly will usually help prevent this problem from occurring.  A mild laxative (prune juice, Milk of Magnesia, MiraLax, etc) should be taken according to package directions if there are no bowel movements after 48 hours.   5. Wash / shower every day.  You may shower over the dressings as they are waterproof.   6. Remove your waterproof bandages 5 days after surgery.  You may leave the incision open to air.  You may replace a dressing/Band-Aid to cover the incision for comfort if you wish.  Continue to shower over incision(s) after the dressing is off.    7. ACTIVITIES as tolerated:   a. You may resume regular (light) daily activities beginning the next day--such as daily self-care, walking, climbing stairs--gradually increasing activities as tolerated.  If you can walk 30 minutes without difficulty, it is safe to try more intense activity such as jogging, treadmill, bicycling, low-impact aerobics, swimming, etc. b. Save the most intensive and strenuous activity for last such as sit-ups, heavy lifting, contact sports, etc  Refrain from any heavy lifting or straining until you are off narcotics for pain control.  c. DO NOT PUSH THROUGH PAIN.  Let pain be your guide: If it hurts to do something, don't do it.  Pain is your body warning you to avoid that activity for another week until the pain goes down. d. You may drive when you are no longer taking prescription pain medication, you can comfortably wear a seatbelt, and you can safely maneuver your car and apply brakes. e. Dennis Bast may have sexual intercourse  when it is comfortable.  8. FOLLOW UP in our office a. Please call [facility] at [phone] to set up an appointment to see your surgeon in the office for a follow-up appointment approximately 2-3 weeks after your surgery. b. Make sure that you call for this appointment the day you arrive home to insure a convenient appointment time. 9.  IF YOU HAVE DISABILITY OR FAMILY LEAVE FORMS, BRING THEM TO THE OFFICE FOR PROCESSING.  DO NOT GIVE THEM TO YOUR DOCTOR.  WHEN TO CALL us [phone]: 1. Poor pain control 2. Reactions / problems with new medications (rash/itching, nausea, etc)  3. Fever over 101.5 F (38.5 C) 4. Inability to urinate 5. Nausea and/or vomiting 6. Worsening swelling or bruising 7. Continued bleeding from incision. 8. Increased pain, redness, or drainage from the incision   The clinic staff is available to answer your questions during regular business hours (8:30am-5pm).  Please don't hesitate to call and ask to speak to one of our nurses for clinical concerns.   If you have a medical emergency, go to the nearest emergency room or call 911.  A surgeon from [facility] is always on call at the hospitals in [facility], [place], [place], [place], [place]  [address] ?  [address] [address], [place], [place]   [address] MAIN: [phone] ? TOLL FREE: [phone] ? FAX: [phone] [email or web address]

## 2011-09-29 NOTE — Progress Notes (Signed)
200cc IV fluids infused in SS. EPIC will not allow me to put it in the I & O column

## 2011-09-29 NOTE — Progress Notes (Signed)
Feeling better. Will leave saline well in until pt is dressed.

## 2011-09-29 NOTE — Progress Notes (Signed)
Ambulated in hall to [place] . Tolerated well. Until back and sat on bed for a few minutes, then "things began to turn black". Laying back in bed for a little bit then wants to go home

## 2011-09-29 NOTE — Anesthesia Postprocedure Evaluation (Signed)
  Anesthesia Post-op Note  Patient: Alyssa [name]  Procedure(s) Performed: Procedure(s) (LRB): LAPAROSCOPIC VENTRAL HERNIA (N/A) INSERTION OF MESH (N/A)  Patient Location: PACU  Anesthesia Type: General  Level of Consciousness: awake and alert   Airway and Oxygen Therapy: Patient Spontanous Breathing  Post-op Pain: mild  Post-op Assessment: Post-op Vital signs reviewed, Patient's Cardiovascular Status Stable, Respiratory Function Stable, Patent Airway and No signs of Nausea or vomiting  Post-op Vital Signs: stable  Complications: No apparent anesthesia complications

## 2011-09-29 NOTE — Op Note (Signed)
NAME:  ONESTY, CLAIR                ACCOUNT NO.:  [id number]  MEDICAL RECORD NO.:  [id number]  LOCATION:  [facility]                         FACILITY:  [facility]  PHYSICIAN:  Adin Hector, MD     DATE OF BIRTH:  1951/03/18  DATE OF PROCEDURE:  09/29/2011 DATE OF DISCHARGE:                              OPERATIVE REPORT   PRIMARY CARE PHYSICIAN:  Lynne Logan, MD  SURGEON:  Adin Hector, MD  ASSIST:  RN.  PREOPERATIVE DIAGNOSIS:  Recurrent ventral wall hernia, incarcerated.  POSTOPERATIVE DIAGNOSIS:  Recurrent ventral wall incisional hernia.  PROCEDURE PERFORMED:  Laparoscopic ventral wall hernia repair with mesh and primary closure.  ANESTHESIA: 1. General anesthesia. 2. Local anesthetic and a field block on all port sites. 3. On-Q continuous bupivacaine pain pump in the preperitoneal plane.  SPECIMENS:  Hernia sac, not sent.  DRAINS:  None.  ESTIMATED BLOOD LOSS:  Minimal.  COMPLICATIONS:  None apparent.  INDICATIONS:  Ms. Bracco is a 61 year old obese female who had prior surgery and had a hernia repair.  She developed recurrence.  It was incarcerated in the office.  Recommendation made for repair. Pathophysiology of herniation with its natural history and risks were discussed.  Risks such as stroke, heart attack, bleeding, death, hernia recurrence etc. were discussed.  I did note she will have significant prolonged postoperative pain and we will do our best including the binder and On-Q bupivacaine pain pump to minimize those pains. Questions answered.  She agreed to proceed.  OPERATIVE FINDINGS:  She had a 4 x 4 cm periumbilical ventral hernia. Initially, it was incarcerated with omentum, but it was reducible after the start of the case after she was asleep.  DESCRIPTION OF PROCEDURE:  Informed consent was confirmed.  Patient underwent general anesthesia without any difficulty.  She was positioned supine with arms tucked.  Her abdomen was prepped and draped in  sterile fashion.  Surgical time-out confirmed our plan.  I placed a #5-mm port in left upper quadrant using optical entry technique with the patient in steep reverse Trendelenburg and left side up.  Entry was clean.  I could see the hernia and after she was asleep, we could reduce it down.  I placed a 5-mm port in the left flank.  I placed a 10-mm port through an infraumbilical curvilinear incision through the hernia itself.  I did measure out the defect.  I decided to use a 20 x 15 cm mesh (Bard polypropylene/Seprafilm dual- sided mesh).  I placed in the abdomen rough side up and I secured it to the anterior abdominal wall using #1 Prolene interrupted stitches x14 using a laparoscopic suture passer around the periphery.  It had at least 5 cm circumferential coverage around the hernia defect.  I decompressed the abdomen, tied the fascial stitch down.  I reexamined. I placed an absorbable secure strap tacks around the edges to good result.  She did have a band of omentum down the right lower quadrant that was developing potential internal hernia points.  I freed that off sharply.  I ran the small bowel and saw no evidence of any adhesions or other evidence of obstruction.  I decompressed the  abdomen.  I did a curvilinear incision on the infraumbilical aspect for about 4 cm.  I trimmed out the hernia sac.  I primarily closed the hernia using #1 interrupted PDS stitches taking a couple bites to incorporate the middle of the fascia for a central tacking of the fascia as well.  It came down relatively well.  I did diagnostic laparoscopy.  There was a little bit of omentum brought up into 1 of the stitches.  I brought that down okay.  I inspected colon and bowel.  There is no injury.  I placed the On-Q continuous pain pump sheath in the preperitoneal plane under laparoscopic guidance.  I decompressed the abdomen.  I closed the skin using 4-0 Monocryl stitch. I closed the puncture sites of  the fascial stitch using Steri-Strips. Sterile dressings applied.  Patient has gone to recovery room.  I have discussed operative findings with patient's family.  She may be able to leave later today if her pain is adequately controlled.  Otherwise, we will observe her until her pain is controlled on oral medications.     Adin Hector, MD     SCG/MEDQ  D:  09/29/2011  T:  09/29/2011  Job:  [id number]  cc:   Lynne Logan, MD Fax: [phone]

## 2011-09-29 NOTE — Progress Notes (Signed)
Dr. Johney Maine in to attach on-q pump.  Dr. Johney Maine cut off clamps.  On-q infusing

## 2011-09-29 NOTE — Progress Notes (Signed)
Patient shivering upon arrival

## 2011-09-29 NOTE — Brief Op Note (Signed)
09/29/2011  9:36 AM  PATIENT:  Alyssa [name]  61 y.o. female  Patient Care Team: Maxine Glenn. Nolon Rod as PCP - General (Family Medicine) Juanita Craver, MD as Consulting Physician (Gastroenterology)  PRE-OPERATIVE DIAGNOSIS:  Incarcerated Ventral Wall Hernia  POST-OPERATIVE DIAGNOSIS:  Recurrent Ventral Wall Hernia  PROCEDURE:  Procedure(s) (LRB): LAPAROSCOPIC VENTRAL HERNIA (N/A) INSERTION OF MESH (N/A)  SURGEON:  Surgeon(s) and Role:    * Adin Hector, MD - Primary  PHYSICIAN ASSISTANT:   ASSISTANTS: none   ANESTHESIA:   local and general  EBL:  Total I/O In: 1600 [I.V.:1600] Out: 50 [Blood:50]  BLOOD ADMINISTERED:none  DRAINS: none   LOCAL MEDICATIONS USED:  BUPIVICAINE  and Amount: 60 ml  SPECIMEN:  Source of Specimen:  Hernia sac (not sent)  DISPOSITION OF SPECIMEN:  N/A  COUNTS:  YES  TOURNIQUET:  * No tourniquets in log *  DICTATION: .Other Dictation: Dictation Number [id number]  PLAN OF CARE: Discharge to home after PACU  PATIENT DISPOSITION:  PACU - hemodynamically stable.   Delay start of Pharmacological VTE agent (>24hrs) due to surgical blood loss or risk of bleeding: no

## 2011-09-29 NOTE — H&P (Signed)
Alyssa [name]  [birth year] [id number]  Patient Care Team: Maxine Glenn. Nolon Rod as PCP - General (Family Medicine) Juanita Craver, MD as Consulting Physician (Gastroenterology) .  Reason for visit: Painful hernia at old incisions near her belly button   No changes since last visit.  The patient is a pleasant obese female who has had intermittent abdominal pains. She describes some sharp tenderness near the right upper bellybutton occasionally the up into the left and almost always right central now. She saw Dr. Collene Mares with gastroenterology in the past. Workup did not reveal a bowel obstruction. A CT enterrhogrram in June2011 did note a hernia near the bellybutton. She's been aware of her hernia around her. It had not been than bothersome. Recently, it has become more sore. She is using Ultram to help manage it. She claims to have a high threshold for pain. However it has gotten worse. She wishes to consider surgery now.   Bowel movement every day. No nausea or vomiting. Regular activity. No wound infections. No fevers chills or sweats. Pretty good exercise tolerance     Patient Active Problem List  Diagnoses  . Hernia, incisional periumbilical, incarcerated  . Obesity (BMI 30-39.9)  . Post concussion syndrome    Past Medical History  Diagnosis Date  . Hyperlipidemia   . Thyroid disease   . Abdominal pain   . Post concussion syndrome   . Ventral hernia   . Hypertension     PCP DR Nolon Rod  . Recurrent upper respiratory infection (URI)     FLU 08/23/11-08/29/11 then URI 08/29/11- 09/08/11- S/P zpack and steroids-  improved      no cough or congestion  . Hypothyroidism   . Post concussion syndrome     4 yrs ago- sees [facility]- Dr Leonie Man every 6 months-   has sleep study 2011 following accident but was neg per patient-  short term memory issues, dates and times  . Headache     Past Surgical History  Procedure Date  . Cholecystectomy 2006    lap chole, Dr. Bubba Camp  . Appendectomy 2002      lap appy.  Dr. Hassell Done  . Carpel tunnel     left in April 2011, right in June 2012  . Tarsal tunnel release     bilateral  . Cesarean section     x2    History   Social History  . Marital Status: Divorced    Spouse Name: N/A    Number of Children: N/A  . Years of Education: N/A   Occupational History  . Not on file.   Social History Main Topics  . Smoking status: Never Smoker   . Smokeless tobacco: Never Used  . Alcohol Use: Yes     occasional  . Drug Use: No  . Sexually Active: Not on file   Other Topics Concern  . Not on file   Social History Narrative  . No narrative on file    History reviewed. No pertinent family history.  Current facility-administered medications:clindamycin (CLEOCIN) IVPB 900 mg, 900 mg, Intravenous, On Call to OR, Adin Hector, MD, 900 mg at 09/29/11 0656;  gentamicin (GARAMYCIN) IVPB 100 mg, 100 mg, Intravenous, On Call to OR, Adin Hector, MD Facility-Administered Medications Ordered in Other Encounters: lactated ringers infusion, , , Continuous PRN, Dione Booze, CRNA  Allergies  Allergen Reactions  . Codeine Other (See Comments)    hallucinations  . Penicillins Hives, Swelling and Rash    All over the  body.  . Percocet (Oxycodone-Acetaminophen) Other (See Comments)    Causes syncope day after medication has been taken.  . Vicodin (Hydrocodone-Acetaminophen) Other (See Comments)    Causes syncope day after medication has been taken.    BP 118/75  Pulse 58  Temp(Src) 97.6 F (36.4 C) (Oral)  Resp 18  SpO2 97%     Review of Systems  Constitutional: Negative for fever, chills, diaphoresis, appetite change and fatigue.  HENT: Negative for ear pain, sore throat, trouble swallowing, neck pain and ear discharge.  Eyes: Negative for photophobia, discharge and visual disturbance.  Respiratory: Negative for cough, choking, chest tightness and shortness of breath.  Cardiovascular: Negative for chest pain and  palpitations.  Gastrointestinal: Positive for abdominal pain. Negative for nausea, vomiting, diarrhea, constipation, blood in stool, abdominal distention, anal bleeding and rectal pain.  Genitourinary: Negative for dysuria, frequency and difficulty urinating.  Musculoskeletal: Negative for myalgias and gait problem.  Skin: Negative for color change, pallor and rash.  Neurological: Negative for dizziness, speech difficulty, weakness and numbness.  Hematological: Negative for adenopathy.  Psychiatric/Behavioral: Negative for confusion, dysphoric mood and agitation. The patient is not nervous/anxious.  Occasional difficulty w recalling dates, times, etc (h/o concussion)  Objective:   Physical Exam  Constitutional: She is oriented to person, place, and time. She appears well-developed and well-nourished. No distress.  HENT:  Head: Normocephalic.  Mouth/Throat: Oropharynx is clear and moist. No oropharyngeal exudate.  Eyes: Conjunctivae and EOM are normal. Pupils are equal, round, and reactive to light. No scleral icterus.  Neck: Normal range of motion. Neck supple. No tracheal deviation present.  Cardiovascular: Normal rate, regular rhythm and intact distal pulses.  Pulmonary/Chest: Effort normal and breath sounds normal. No respiratory distress. She exhibits no tenderness.  Abdominal: Soft. She exhibits no distension. There is tenderness. There is no rebound and no guarding. Hernia confirmed negative in the right inguinal area and confirmed negative in the left inguinal area.  Incarcerated mass RLQ side of bellybutton 6x6cm round.  Old midline incision - no definite VWH there  Genitourinary: No vaginal discharge found.  Musculoskeletal: Normal range of motion. She exhibits no tenderness.  Lymphadenopathy:  She has no cervical adenopathy.  Right: No inguinal adenopathy present.  Left: No inguinal adenopathy present.  Neurological: She is alert and oriented to person, place, and time. No  cranial nerve deficit. She exhibits normal muscle tone. Coordination normal.  Skin: Skin is warm and dry. No rash noted. She is not diaphoretic. No erythema.  Psychiatric: She has a normal mood and affect. Her behavior is normal. Judgment and thought content normal.  Assessment:   Incarcerated [place]  Plan:   I think she would benefit from repair. I would like to do this laparoscopically.   The anatomy & physiology of the abdominal wall was discussed. The pathophysiology of hernias was discussed. Natural history risks without surgery of enlargement, pain, incarceration & strangulation was discussed. Contributors to complications such as smoking, obesity, diabetes, prior surgery, etc were discussed. I feel the risks of no intervention will lead to serious problems that outweigh the operative risks; therefore, I recommended surgery to reduce and repair the hernia. I explained laparoscopic techniques with possible need for an open approach. I noted the probable use of mesh to patch and/or buttress hernia repair   Risks such as bleeding, infection, abscess, need for further treatment, heart attack, death, and other risks were discussed. I noted a good likelihood this will help address the problem. Goals of post-operative recovery  were discussed as well. Possibility that this will not correct all symptoms was explained. I stressed the importance of low-impact activity, aggressive pain control, avoiding constipation, & not pushing through pain to minimize risk of post-operative chronic pain or injury. Possibility of reherniation was discussed. We will work to minimize complications. An educational handout further explaining the pathology & treatment options was given as well. Questions were answered. The patient expresses understanding & wishes to proceed with surgery.  She was hoping to go back to work the following week. I cautioned her that this is the source surgery and takes weeks if not months to recover  from. I recommended she have the ability to have a substitute teacher to help cover for her for a few weeks at least. She is hoping to fly to a conference next month as well. I caution that I did not know that was feasible that soon after surgery. She claims she had a friend with her it would be safe. We will try to coordinate this and a convenient time.   I discussed the patient's status to the family.  Questions were answered.  They expressed understanding & appreciation.

## 2011-09-29 NOTE — Progress Notes (Signed)
Pharmacy called for prn since released and not received it yet.  Patient is not shivering at present

## 2011-10-03 ENCOUNTER — Encounter (INDEPENDENT_AMBULATORY_CARE_PROVIDER_SITE_OTHER): Payer: BC Managed Care – PPO | Admitting: Surgery

## 2011-10-07 ENCOUNTER — Emergency Department (HOSPITAL_COMMUNITY): Payer: BC Managed Care – PPO

## 2011-10-07 ENCOUNTER — Inpatient Hospital Stay (HOSPITAL_COMMUNITY)
Admission: EM | Admit: 2011-10-07 | Discharge: 2011-10-12 | DRG: 078 | Disposition: A | Discharge status: Home / Self Care | Payer: BC Managed Care – PPO | Attending: Internal Medicine | Admitting: Internal Medicine

## 2011-10-07 ENCOUNTER — Other Ambulatory Visit: Payer: Self-pay

## 2011-10-07 ENCOUNTER — Encounter (HOSPITAL_COMMUNITY): Payer: Self-pay | Admitting: *Deleted

## 2011-10-07 DIAGNOSIS — E785 Hyperlipidemia, unspecified: Secondary | ICD-10-CM | POA: Diagnosis present

## 2011-10-07 DIAGNOSIS — D72829 Elevated white blood cell count, unspecified: Secondary | ICD-10-CM | POA: Diagnosis present

## 2011-10-07 DIAGNOSIS — E079 Disorder of thyroid, unspecified: Secondary | ICD-10-CM | POA: Diagnosis present

## 2011-10-07 DIAGNOSIS — E039 Hypothyroidism, unspecified: Secondary | ICD-10-CM | POA: Diagnosis present

## 2011-10-07 DIAGNOSIS — I10 Essential (primary) hypertension: Secondary | ICD-10-CM | POA: Diagnosis present

## 2011-10-07 DIAGNOSIS — I2699 Other pulmonary embolism without acute cor pulmonale: Principal | ICD-10-CM | POA: Diagnosis present

## 2011-10-07 DIAGNOSIS — E0789 Other specified disorders of thyroid: Secondary | ICD-10-CM | POA: Diagnosis present

## 2011-10-07 DIAGNOSIS — E871 Hypo-osmolality and hyponatremia: Secondary | ICD-10-CM | POA: Diagnosis present

## 2011-10-07 DIAGNOSIS — F0781 Postconcussional syndrome: Secondary | ICD-10-CM

## 2011-10-07 DIAGNOSIS — E669 Obesity, unspecified: Secondary | ICD-10-CM

## 2011-10-07 DIAGNOSIS — R197 Diarrhea, unspecified: Secondary | ICD-10-CM | POA: Diagnosis present

## 2011-10-07 DIAGNOSIS — K432 Incisional hernia without obstruction or gangrene: Secondary | ICD-10-CM

## 2011-10-07 HISTORY — DX: Disorder of thyroid, unspecified: E07.9

## 2011-10-07 LAB — URINALYSIS, ROUTINE W REFLEX MICROSCOPIC
Hgb urine dipstick: NEGATIVE
Protein, ur: NEGATIVE mg/dL
Urobilinogen, UA: 0.2 mg/dL (ref 0.0–1.0)

## 2011-10-07 LAB — BASIC METABOLIC PANEL
Chloride: 92 mEq/L — ABNORMAL LOW (ref 96–112)
GFR calc Af Amer: >90 mL/min (ref >90)
GFR calc non Af Amer: >90 mL/min (ref >90)
Glucose, Bld: 121 mg/dL — ABNORMAL HIGH (ref 70–99)
Potassium: 3.4 mEq/L — ABNORMAL LOW (ref 3.5–5.1)
Sodium: 131 mEq/L — ABNORMAL LOW (ref 135–145)

## 2011-10-07 LAB — DIFFERENTIAL
Basophils Relative: 0 % (ref 0–1)
Eosinophils Absolute: 0.1 10*3/uL (ref 0.0–0.7)
Lymphs Abs: 1.6 10*3/uL (ref 0.7–4.0)
Neutro Abs: 9.2 10*3/uL — ABNORMAL HIGH (ref 1.7–7.7)
Neutrophils Relative %: 76 % (ref 43–77)

## 2011-10-07 LAB — LACTIC ACID, PLASMA: Lactic Acid, Venous: 0.8 mmol/L (ref 0.5–2.2)

## 2011-10-07 LAB — CBC
Hemoglobin: 13.5 g/dL (ref 12.0–15.0)
MCH: 28.7 pg (ref 26.0–34.0)
Platelets: 246 10*3/uL (ref 150–400)
RBC: 4.71 MIL/uL (ref 3.87–5.11)

## 2011-10-07 LAB — PROTIME-INR: INR: 1.05 (ref 0.00–1.49)

## 2011-10-07 LAB — LIPASE, BLOOD: Lipase: 35 U/L (ref 11–59)

## 2011-10-07 MED ORDER — ONDANSETRON HCL 4 MG/2ML IJ SOLN
4.0000 mg | Freq: Once | INTRAMUSCULAR | Status: AC
  Administered 2011-10-07: 4 mg via INTRAVENOUS
  Filled 2011-10-07: qty 2

## 2011-10-07 MED ORDER — ACETAMINOPHEN 325 MG PO TABS
650.0000 mg | ORAL_TABLET | Freq: Four times a day (QID) | ORAL | Status: DC | PRN
  Administered 2011-10-08 – 2011-10-09 (×8): 650 mg via ORAL
  Filled 2011-10-07 (×8): qty 2

## 2011-10-07 MED ORDER — ATORVASTATIN CALCIUM 20 MG PO TABS
20.0000 mg | ORAL_TABLET | Freq: Every day | ORAL | Status: DC
  Administered 2011-10-07 – 2011-10-11 (×5): 20 mg via ORAL
  Filled 2011-10-07 (×8): qty 1

## 2011-10-07 MED ORDER — SODIUM CHLORIDE 0.9 % IJ SOLN
3.0000 mL | Freq: Two times a day (BID) | INTRAMUSCULAR | Status: DC
  Administered 2011-10-07 – 2011-10-08 (×2): 3 mL via INTRAVENOUS
  Administered 2011-10-08: 21:00:00 via INTRAVENOUS
  Administered 2011-10-09 – 2011-10-12 (×4): 3 mL via INTRAVENOUS

## 2011-10-07 MED ORDER — TRAMADOL HCL 50 MG PO TABS
50.0000 mg | ORAL_TABLET | Freq: Four times a day (QID) | ORAL | Status: DC | PRN
  Administered 2011-10-08: 100 mg via ORAL
  Administered 2011-10-08: 50 mg via ORAL
  Administered 2011-10-08 – 2011-10-09 (×6): 100 mg via ORAL
  Filled 2011-10-07: qty 2
  Filled 2011-10-07: qty 1
  Filled 2011-10-07 (×6): qty 2

## 2011-10-07 MED ORDER — SODIUM CHLORIDE 0.9 % IV SOLN
INTRAVENOUS | Status: AC
  Administered 2011-10-07: 17:00:00 via INTRAVENOUS
  Administered 2011-10-07: 75 mL/h via INTRAVENOUS

## 2011-10-07 MED ORDER — POLYETHYLENE GLYCOL 3350 17 G PO PACK
17.0000 g | PACK | Freq: Every day | ORAL | Status: DC | PRN
  Filled 2011-10-07: qty 1

## 2011-10-07 MED ORDER — MORPHINE SULFATE 4 MG/ML IJ SOLN
4.0000 mg | Freq: Once | INTRAMUSCULAR | Status: AC
  Administered 2011-10-07: 4 mg via INTRAVENOUS
  Filled 2011-10-07: qty 1

## 2011-10-07 MED ORDER — MORPHINE SULFATE 4 MG/ML IJ SOLN
4.0000 mg | INTRAMUSCULAR | Status: DC | PRN
  Administered 2011-10-07: 4 mg via INTRAVENOUS
  Filled 2011-10-07: qty 1

## 2011-10-07 MED ORDER — ASPIRIN 81 MG PO CHEW
324.0000 mg | CHEWABLE_TABLET | Freq: Once | ORAL | Status: AC
  Administered 2011-10-07: 324 mg via ORAL
  Filled 2011-10-07: qty 4

## 2011-10-07 MED ORDER — IOHEXOL 300 MG/ML  SOLN
100.0000 mL | Freq: Once | INTRAMUSCULAR | Status: AC | PRN
  Administered 2011-10-07: 100 mL via INTRAVENOUS

## 2011-10-07 MED ORDER — ACETAMINOPHEN 650 MG RE SUPP: 650.0000 mg | Freq: Four times a day (QID) | RECTAL | Status: DC | PRN

## 2011-10-07 MED ORDER — HEPARIN (PORCINE) IN NACL 100-0.45 UNIT/ML-% IJ SOLN
1500.0000 [IU]/h | INTRAMUSCULAR | Status: AC
  Administered 2011-10-07: 1400 [IU]/h via INTRAVENOUS
  Administered 2011-10-07: 1200 [IU]/h via INTRAVENOUS
  Administered 2011-10-07: 2000 [IU]/h via INTRAVENOUS
  Administered 2011-10-08 – 2011-10-10 (×3): 1400 [IU]/h via INTRAVENOUS
  Administered 2011-10-11 – 2011-10-12 (×3): 1500 [IU]/h via INTRAVENOUS
  Filled 2011-10-07 (×11): qty 250

## 2011-10-07 MED ORDER — HEPARIN BOLUS VIA INFUSION: 2000.0000 [IU] | Freq: Once | INTRAVENOUS | Status: DC

## 2011-10-07 MED ORDER — TRAZODONE HCL 50 MG PO TABS
50.0000 mg | ORAL_TABLET | Freq: Every day | ORAL | Status: DC
  Administered 2011-10-07 – 2011-10-11 (×5): 50 mg via ORAL
  Filled 2011-10-07 (×7): qty 1

## 2011-10-07 MED ORDER — ONDANSETRON HCL 4 MG PO TABS: 4.0000 mg | ORAL_TABLET | Freq: Four times a day (QID) | ORAL | Status: DC | PRN

## 2011-10-07 MED ORDER — DIVALPROEX SODIUM 500 MG PO DR TAB
500.0000 mg | DELAYED_RELEASE_TABLET | Freq: Every day | ORAL | Status: DC
  Administered 2011-10-07 – 2011-10-11 (×5): 500 mg via ORAL
  Filled 2011-10-07 (×7): qty 1

## 2011-10-07 MED ORDER — ONDANSETRON HCL 4 MG/2ML IJ SOLN: 4.0000 mg | Freq: Four times a day (QID) | INTRAMUSCULAR | Status: DC | PRN

## 2011-10-07 MED ORDER — HYDROMORPHONE HCL PF 1 MG/ML IJ SOLN
1.0000 mg | INTRAMUSCULAR | Status: DC | PRN
  Administered 2011-10-07 – 2011-10-09 (×14): 1 mg via INTRAVENOUS
  Filled 2011-10-07 (×17): qty 1

## 2011-10-07 MED ORDER — SODIUM CHLORIDE 0.9 % IV BOLUS (SEPSIS)
1000.0000 mL | Freq: Once | INTRAVENOUS | Status: AC
  Administered 2011-10-07: 1000 mL via INTRAVENOUS

## 2011-10-07 NOTE — ED Notes (Signed)
Pt had hernia surgery x 8 days ago.  Pt started to have pain with ambulation x 2 days ago and a feeling like she was having difficulty with deep inspiration.  Pt states that her pain is rated @ 10/10 and is epigastric and in her back.

## 2011-10-07 NOTE — ED Notes (Signed)
Pt has spoke to EDP and is aware of her diagnosis. Awaiting admitting Md to consult. Awaiting pharmacy to dose Heparin. Pt stable.

## 2011-10-07 NOTE — ED Provider Notes (Signed)
History     CSN: [id number]  Arrival date & time 10/07/11  5465   First MD Initiated Contact with Patient 10/07/11 [phone]      Chief Complaint  Patient presents with  . Abdominal Pain    (Consider location/radiation/quality/duration/timing/severity/associated sxs/prior treatment) HPI Patient is a 61 year old female who presents today with chief complaint of chest pain and shortness of breath. Patient noted this at around 1 PM today. She describes the pain as a substernal pressure. This is worse with taking a deep breath. Patient has no history of coronary artery disease. Patient is status post ventral hernia repair with Dr. Johney Maine on March 9. Patient did not have significant immobilization for this. She reports that the procedure was performed at same day surgery and patient was discharged after this. She's been ambulate in regularly per her report. She denies any abdominal pain, nausea, vomiting, or fevers. Her incisions have been clean and dry. Patient has only been requiring tramadol for her pain and reports that she was feeling better until this afternoon. Patient has no personal history of cancer, PE, or DVT. She does have family history of pulmonary emboli. Patient denies any cough or sick contacts. There are no other associated or modifying factors. Past Medical History  Diagnosis Date  . Hyperlipidemia   . Thyroid disease   . Abdominal pain   . Post concussion syndrome   . Ventral hernia   . Hypertension     PCP DR Nolon Rod  . Recurrent upper respiratory infection (URI)     FLU 08/23/11-08/29/11 then URI 08/29/11- 09/08/11- S/P zpack and steroids-  improved      no cough or congestion  . Hypothyroidism   . Post concussion syndrome     4 yrs ago- sees [facility]- Dr Leonie Man every 6 months-   has sleep study 2011 following accident but was neg per patient-  short term memory issues, dates and times  . Headache     Past Surgical History  Procedure Date  . Cholecystectomy 2006   lap chole, Dr. Bubba Camp  . Appendectomy 2002    lap appy.  Dr. Hassell Done  . Carpel tunnel     left in April 2011, right in June 2012  . Tarsal tunnel release     bilateral  . Cesarean section     x2    Family History  Problem Relation Age of Onset  . Malignant hyperthermia Mother   . Heart attack Father     History  Substance Use Topics  . Smoking status: Never Smoker   . Smokeless tobacco: Never Used  . Alcohol Use: 0.6 oz/week    1 Glasses of wine per week     occasional    OB History    Grav Para Term Preterm Abortions TAB SAB Ect Mult Living                  Review of Systems  Constitutional: Negative.   HENT: Negative.   Eyes: Negative.   Respiratory: Positive for shortness of breath.   Cardiovascular: Positive for chest pain.  Gastrointestinal: Positive for abdominal pain.       Abdominal pain is consistent with a minor amount patient would expect following surgery.  Genitourinary: Negative.   Musculoskeletal: Negative.   Skin: Negative.   Neurological: Negative.   Hematological: Negative.   Psychiatric/Behavioral: Negative.   All other systems reviewed and are negative.    Allergies  Codeine; Penicillins; Percocet; and Vicodin  Home Medications  Current Outpatient Rx  Name Route Sig Dispense Refill  . CETIRIZINE HCL 10 MG PO TABS Oral Take 10 mg by mouth every evening.     Marland Kitchen DIVALPROEX SODIUM 500 MG PO TBEC Oral Take 500 mg by mouth at bedtime.     Marland Kitchen HYDROCHLOROTHIAZIDE 25 MG PO TABS Oral Take 25 mg by mouth at bedtime.     Marland Kitchen ROSUVASTATIN CALCIUM 10 MG PO TABS Oral Take 10 mg by mouth at bedtime.     . TRAMADOL HCL 50 MG PO TABS Oral Take 1-2 tablets (50-100 mg total) by mouth every 6 (six) hours as needed for pain. 50 tablet 0  . TRAZODONE HCL 50 MG PO TABS Oral Take 50 mg by mouth at bedtime.      BP 125/66  Pulse 88  Temp(Src) 98.5 F (36.9 C) (Oral)  Resp 20  Ht 5' 2"  (1.575 m)  Wt 196 lb (88.905 kg)  BMI 35.85 kg/m2  SpO2  99%  Physical Exam  Nursing note and vitals reviewed. GEN: Well-developed, well-nourished female in no distress HEENT: Atraumatic, normocephalic. Oropharynx clear without erythema EYES: PERRLA BL, no scleral icterus. NECK: Trachea midline, no meningismus CV: regular rate and rhythm. No murmurs, rubs, or gallops PULM: No respiratory distress.  No crackles, wheezes, or rales. GI: soft, minimally tender. No guarding, rebound. + bowel sounds. Recent surgical incisions are all clean, dry, and intact with no surrounding erythema or drainage concerning for infection.  GU: deferred Neuro: cranial nerves 2-12 intact, no abnormalities of strength or sensation, A and O x 3 MSK: Patient moves all 4 extremities symmetrically, no deformity, edema, or injury noted Skin: No rashes petechiae, purpura, or jaundice Psych: no abnormality of mood   ED Course  Procedures (including critical care time)   Date: 10/07/2011  Rate: 85  Rhythm: normal sinus rhythm  QRS Axis: normal  Intervals: normal  ST/T Wave abnormalities: nonspecific T wave changes  Conduction Disutrbances:none  Narrative Interpretation:   Old EKG Reviewed: unchanged   Labs Reviewed  CBC - Abnormal; Notable for the following:    WBC 12.1 (*)    All other components within normal limits  DIFFERENTIAL - Abnormal; Notable for the following:    Neutro Abs 9.2 (*)    Monocytes Absolute 1.3 (*)    All other components within normal limits  BASIC METABOLIC PANEL - Abnormal; Notable for the following:    Sodium 131 (*)    Potassium 3.4 (*)    Chloride 92 (*)    Glucose, Bld 121 (*)    All other components within normal limits  URINALYSIS, ROUTINE W REFLEX MICROSCOPIC - Abnormal; Notable for the following:    Color, Urine AMBER (*) BIOCHEMICALS MAY BE AFFECTED BY COLOR   APPearance CLOUDY (*)    Specific Gravity, Urine 1.034 (*)    Bilirubin Urine SMALL (*)    Ketones, ur TRACE (*)    All other components within normal limits   PROTIME-INR  LIPASE, BLOOD  LACTIC ACID, PLASMA  TROPONIN I  HEPARIN LEVEL (UNFRACTIONATED)   Ct Angio Chest W/cm &/or Wo Cm  10/07/2011  *RADIOLOGY REPORT*  Clinical Data: Shortness of breath with deep inspiration; epigastric and left upper quadrant abdominal pain.  History of abdominal hernia repair 8 days ago.  CT ANGIOGRAPHY CHEST  Technique:  Multidetector CT imaging of the chest using the standard protocol during bolus administration of intravenous contrast. Multiplanar reconstructed images including MIPs were obtained and reviewed to evaluate the vascular anatomy.  Contrast: 180m OMNIPAQUE IOHEXOL 300 MG/ML IJ SOLN  Comparison: Chest radiograph performed 09/12/2011  Findings: There is a large pulmonary embolus at the bifurcation of the pulmonary artery to the right middle and lower lung lobes, extending distally.  Additional segmental and subsegmental pulmonary emboli are noted to all lobes of both lungs.  Small bilateral pleural effusions are noted; pulmonary infarcts are seen at both lower lobes, more prominent on the right, with associated atelectasis.  There is no evidence of pneumothorax.  No masses are identified; no abnormal focal contrast enhancement is seen.  The mediastinum is unremarkable in appearance.  No mediastinal lymphadenopathy is seen.  No pericardial effusion is identified. The great vessels are unremarkable in appearance.  No axillary lymphadenopathy is seen.  There is a heterogeneous 3.6 cm mass arising at the inferior aspect of the left thyroid lobe; this raises concern for malignancy. Thyroid ultrasound would be helpful for further evaluation; would correlate with thyroid labs, and evaluate further as deemed clinically appropriate.  The visualized portions of the liver and spleen are unremarkable.  No acute osseous abnormalities are seen.  IMPRESSION:  1.  Large pulmonary embolus at the bifurcation of the pulmonary artery to the right middle and lower lung lobes, extending  distally; additional segmental and subsegmental pulmonary emboli noted to all lobes of both lungs. 2.  Small bilateral pleural effusions, with pulmonary infarcts at the lower lobes, more prominent on the right, and associated atelectasis. 3.  Heterogeneous 3.6 cm mass arising at the inferior aspect of the left thyroid lobe; this raises concern for malignancy.  Thyroid ultrasound would be helpful for further evaluation; would correlate with thyroid labs, and evaluate further as deemed clinically appropriate.  Critical Value/emergent results were called by telephone at the time of interpretation on 10/07/2011  at 05:54 a.m.  to  Dr. MChauncy Passy who verbally acknowledged these results.  Original Report Authenticated By: JSanta Lighter M.D.   Dg Abd Acute W/chest  10/07/2011  *RADIOLOGY REPORT*  Clinical Data: Abdominal pain and mid chest pain; difficulty with deep inspiration.  Recent hernia surgery.  ACUTE ABDOMEN SERIES (ABDOMEN 2 VIEW & CHEST 1 VIEW)  Comparison: Chest radiograph performed 09/12/2011, and CT of the abdomen and pelvis performed 12/22/2009  Findings: The lungs are well-aerated.  Vascular congestion is noted; peribronchial thickening is seen.  Mild left basilar atelectasis is noted.  There is no evidence of pleural effusion or pneumothorax.  The cardiomediastinal silhouette is within normal limits.  The visualized bowel gas pattern is unremarkable.  Scattered stool and air are seen within the colon; there is no evidence of small bowel dilatation to suggest obstruction.  No free intra-abdominal air is identified on the provided upright view.  Clips are noted within the right upper quadrant, reflecting prior cholecystectomy.  No acute osseous abnormalities are seen; the sacroiliac joints are unremarkable in appearance.  IMPRESSION:  1.  Unremarkable bowel gas pattern; no free intra-abdominal air seen. 2.  Vascular congestion and peribronchial thickening noted.  Mild left basilar atelectasis seen.   Original Report Authenticated By: JSanta Lighter M.D.     1. Pulmonary emboli   2. Thyroid mass       MDM  Patient was evaluated by myself. Based on evaluation patient had workup for possible abdominal causes of her symptoms as well as chest pain. EKG was initially unremarkable as was chest x-ray. Patient did not have significant laboratory findings other than hyponatremia to 131 today. This is a new finding for the patient. Patient  additionally had a CT of the chest performed given recent surgery as well as her constellation of symptoms. Patient did have numerous PEs noted as well as a thyroid mass concerning for malignancy. I spoke with Dr. Kristin Bruins who was on call for surgery. He agreed with plan to anticoagulate with heparin. Patient will be admitted [facility] for further management.        Chauncy Passy, MD 10/07/11 [phone]

## 2011-10-07 NOTE — H&P (Signed)
Hospital Admission Note Date: 10/07/2011  PCP: Lynne Logan, MD, MD  Chief Complaint: Shortness of breath and chest pain  History of Present Illness: Is a 61 year old female with past medical history of hypothyroidism, recently discharged from the hospital for ventral hernia that comes in for sudden onset of shortness of breath and chest pain. She relates this started 2 days prior to admission. She not able to take deep breath as this makes her pain worse. She relates she cannot walk more than 400 feet without getting short of breath. This progressively got worse until she decided to come to the ED. here in the ED her blood pressure has been stable, she is saturating above 90%. A CT angiogram the chest was done that showed very large right pulmonary emboli so we were asked to admit and further evaluate.  No nausea or vomiting no diarrhea. No long trips. He has not been up-to-date in her cancer screening (colonoscopy in cervical Pap smear). She relates no leg swelling. No recent travel history, no sick contacts.  Allergies: Codeine; Penicillins; Percocet; and Vicodin Past Medical History  Diagnosis Date  . Hyperlipidemia   . Thyroid disease   . Abdominal pain   . Post concussion syndrome   . Ventral hernia   . Hypertension     PCP DR Nolon Rod  . Recurrent upper respiratory infection (URI)     FLU 08/23/11-08/29/11 then URI 08/29/11- 09/08/11- S/P zpack and steroids-  improved      no cough or congestion  . Hypothyroidism   . Post concussion syndrome     4 yrs ago- sees [facility]- Dr Leonie Man every 6 months-   has sleep study 2011 following accident but was neg per patient-  short term memory issues, dates and times  . Headache    Prior to Admission medications   Medication Sig Start Date End Date Taking? Authorizing Provider  cetirizine (ZYRTEC) 10 MG tablet Take 10 mg by mouth every evening.    Yes Historical Provider, MD  divalproex (DEPAKOTE) 500 MG DR tablet Take 500 mg by mouth at  bedtime.    Yes Historical Provider, MD  hydrochlorothiazide (HYDRODIURIL) 25 MG tablet Take 25 mg by mouth at bedtime.    Yes Historical Provider, MD  rosuvastatin (CRESTOR) 10 MG tablet Take 10 mg by mouth at bedtime.    Yes Historical Provider, MD  traMADol (ULTRAM) 50 MG tablet Take 1-2 tablets (50-100 mg total) by mouth every 6 (six) hours as needed for pain. 09/29/11 10/09/11 Yes Adin Hector, MD  traZODone (DESYREL) 50 MG tablet Take 50 mg by mouth at bedtime.   Yes Historical Provider, MD   Past Surgical History  Procedure Date  . Cholecystectomy 2006    lap chole, Dr. Bubba Camp  . Appendectomy 2002    lap appy.  Dr. Hassell Done  . Carpel tunnel     left in April 2011, right in June 2012  . Tarsal tunnel release     bilateral  . Cesarean section     x2   Family History  Problem Relation Age of Onset  . Malignant hyperthermia Mother   . Heart attack Father    History   Social History  . Marital Status: Divorced    Spouse Name: N/A    Number of Children: N/A  . Years of Education: N/A   Occupational History  . Not on file.   Social History Main Topics  . Smoking status: Never Smoker   . Smokeless tobacco: Never Used  .  Alcohol Use: 0.6 oz/week    1 Glasses of wine per week     occasional  . Drug Use: No  . Sexually Active: Yes   Other Topics Concern  . Not on file   Social History Narrative  . No narrative on file    REVIEW OF SYSTEMS:  Constitutional:  No weight loss, night sweats, Fevers, chills, fatigue.  HEENT:  No headaches, Difficulty swallowing,Tooth/dental problems,Sore throat,  No sneezing, itching, ear ache, nasal congestion, post nasal drip,  Cardio-vascular:  No chest pain, Orthopnea, PND, swelling in lower extremities, anasarca, dizziness, palpitations  GI:  No heartburn, indigestion, abdominal pain, nausea, vomiting, diarrhea, change in bowel habits, loss of appetite  Resp:  No shortness of breath with exertion or at rest. No excess mucus, no  productive cough, No non-productive cough, No coughing up of blood.No change in color of mucus.No wheezing.No chest wall deformity  Skin:  no rash or lesions.  GU:  no dysuria, change in color of urine, no urgency or frequency. No flank pain.  Musculoskeletal:  No joint pain or swelling. No decreased range of motion. No back pain.  Psych:  No change in mood or affect. No depression or anxiety. No memory loss.   Physical Exam: Filed Vitals:   10/07/11 0231 10/07/11 0255 10/07/11 0621 10/07/11 0802  BP: 155/62 155/106  125/66  Pulse: 91 88  88  Temp: 98.2 F (36.8 C) 99.5 F (37.5 C)  98.5 F (36.9 C)  TempSrc: Oral Oral    Resp: 20 18  20   Height:   5' 2"  (1.575 m)   Weight:   88.905 kg (196 lb)   SpO2: 92% 95%  99%   No intake or output data in the 24 hours ending 10/07/11 0808 BP 125/66  Pulse 88  Temp(Src) 98.5 F (36.9 C) (Oral)  Resp 20  Ht 5' 2"  (1.575 m)  Wt 88.905 kg (196 lb)  BMI 35.85 kg/m2  SpO2 99%  General Appearance:    Alert, cooperative, no distress, appears stated age  Head:    Normocephalic, without obvious abnormality, atraumatic  Eyes:    PERRL, conjunctiva/corneas clear, EOM's intact, fundi    benign, both eyes  Ears:    Normal TM's and external ear canals, both ears  Nose:   Nares normal, septum midline, mucosa normal, no drainage    or sinus tenderness  Throat:   Lips, mucosa, and tongue normal; teeth and gums normal  Neck:   Supple, symmetrical, trachea midline, no adenopathy;    thyroid:  no enlargement/tenderness/nodules; no carotid   bruit or JVD  Back:     Symmetric, no curvature, ROM normal, no CVA tenderness  Lungs:     Clear to auscultation bilaterally, respirations unlabored  Chest Wall:    No tenderness or deformity   Heart:    Regular rate and rhythm, S1 and S2 normal, no murmur, rub   or gallop     Abdomen:     Soft, non-tender, bowel sounds active all four quadrants,    no masses, no organomegaly. She has an abdominal scar  nontender no redness no drainage.         Extremities:   Extremities normal, atraumatic, no cyanosis or edema  Pulses:   2+ and symmetric all extremities  Skin:   Skin color, texture, turgor normal, no rashes or lesions  Lymph nodes:   Cervical, supraclavicular, and axillary nodes normal  Neurologic:   CNII-XII intact, normal strength, sensation and reflexes  throughout   Lab results:  Basename 10/07/11 0318  NA 131*  K 3.4*  CL 92*  CO2 28  GLUCOSE 121*  BUN 19  CREATININE 0.74  CALCIUM 9.6  MG --  PHOS --   No results found for this basename: AST:2,ALT:2,ALKPHOS:2,BILITOT:2,PROT:2,ALBUMIN:2 in the last 72 hours  Basename 10/07/11 0318  LIPASE 35  AMYLASE --    Basename 10/07/11 0318  WBC 12.1*  NEUTROABS 9.2*  HGB 13.5  HCT 40.1  MCV 85.1  PLT 246    Basename 10/07/11 0318  CKTOTAL --  CKMB --  CKMBINDEX --  TROPONINI <0.30   No components found with this basename: POCBNP:3 No results found for this basename: DDIMER:2 in the last 72 hours No results found for this basename: HGBA1C:2 in the last 72 hours No results found for this basename: CHOL:2,HDL:2,LDLCALC:2,TRIG:2,CHOLHDL:2,LDLDIRECT:2 in the last 72 hours No results found for this basename: TSH,T4TOTAL,FREET3,T3FREE,THYROIDAB in the last 72 hours No results found for this basename: VITAMINB12:2,FOLATE:2,FERRITIN:2,TIBC:2,IRON:2,RETICCTPCT:2 in the last 72 hours Imaging results:  Dg Chest 2 View  09/12/2011  *RADIOLOGY REPORT*  Clinical Data: 61 year old female preoperative study for hernia repair. Hypertension.  CHEST - 2 VIEW  Comparison: 06/09/2005.  Findings: Stable scoliosis.  Normal lung volumes. Normal cardiac size and mediastinal contours.  The lungs remain clear.  Right upper quadrant surgical clips. No acute osseous abnormality identified.  IMPRESSION: Negative, no acute cardiopulmonary abnormality.  Original Report Authenticated By: Randall An, M.D.   Ct Angio Chest W/cm &/or Wo  Cm  10/07/2011  *RADIOLOGY REPORT*  Clinical Data: Shortness of breath with deep inspiration; epigastric and left upper quadrant abdominal pain.  History of abdominal hernia repair 8 days ago.  CT ANGIOGRAPHY CHEST  Technique:  Multidetector CT imaging of the chest using the standard protocol during bolus administration of intravenous contrast. Multiplanar reconstructed images including MIPs were obtained and reviewed to evaluate the vascular anatomy.  Contrast: 162m OMNIPAQUE IOHEXOL 300 MG/ML IJ SOLN  Comparison: Chest radiograph performed 09/12/2011  Findings: There is a large pulmonary embolus at the bifurcation of the pulmonary artery to the right middle and lower lung lobes, extending distally.  Additional segmental and subsegmental pulmonary emboli are noted to all lobes of both lungs.  Small bilateral pleural effusions are noted; pulmonary infarcts are seen at both lower lobes, more prominent on the right, with associated atelectasis.  There is no evidence of pneumothorax.  No masses are identified; no abnormal focal contrast enhancement is seen.  The mediastinum is unremarkable in appearance.  No mediastinal lymphadenopathy is seen.  No pericardial effusion is identified. The great vessels are unremarkable in appearance.  No axillary lymphadenopathy is seen.  There is a heterogeneous 3.6 cm mass arising at the inferior aspect of the left thyroid lobe; this raises concern for malignancy. Thyroid ultrasound would be helpful for further evaluation; would correlate with thyroid labs, and evaluate further as deemed clinically appropriate.  The visualized portions of the liver and spleen are unremarkable.  No acute osseous abnormalities are seen.  IMPRESSION:  1.  Large pulmonary embolus at the bifurcation of the pulmonary artery to the right middle and lower lung lobes, extending distally; additional segmental and subsegmental pulmonary emboli noted to all lobes of both lungs. 2.  Small bilateral pleural  effusions, with pulmonary infarcts at the lower lobes, more prominent on the right, and associated atelectasis. 3.  Heterogeneous 3.6 cm mass arising at the inferior aspect of the left thyroid lobe; this raises concern for malignancy.  Thyroid ultrasound would be helpful for further evaluation; would correlate with thyroid labs, and evaluate further as deemed clinically appropriate.  Critical Value/emergent results were called by telephone at the time of interpretation on 10/07/2011  at 05:54 a.m.  to  Dr. Chauncy Passy, who verbally acknowledged these results.  Original Report Authenticated By: Santa Lighter, M.D.   Dg Abd Acute W/chest  10/07/2011  *RADIOLOGY REPORT*  Clinical Data: Abdominal pain and mid chest pain; difficulty with deep inspiration.  Recent hernia surgery.  ACUTE ABDOMEN SERIES (ABDOMEN 2 VIEW & CHEST 1 VIEW)  Comparison: Chest radiograph performed 09/12/2011, and CT of the abdomen and pelvis performed 12/22/2009  Findings: The lungs are well-aerated.  Vascular congestion is noted; peribronchial thickening is seen.  Mild left basilar atelectasis is noted.  There is no evidence of pleural effusion or pneumothorax.  The cardiomediastinal silhouette is within normal limits.  The visualized bowel gas pattern is unremarkable.  Scattered stool and air are seen within the colon; there is no evidence of small bowel dilatation to suggest obstruction.  No free intra-abdominal air is identified on the provided upright view.  Clips are noted within the right upper quadrant, reflecting prior cholecystectomy.  No acute osseous abnormalities are seen; the sacroiliac joints are unremarkable in appearance.  IMPRESSION:  1.  Unremarkable bowel gas pattern; no free intra-abdominal air seen. 2.  Vascular congestion and peribronchial thickening noted.  Mild left basilar atelectasis seen.  Original Report Authenticated By: Santa Lighter, M.D.   Other results: EKG: normal EKG, normal sinus rhythm.   Patient  Active Hospital Problem List: Acute pulmonary embolism (10/07/2011) Is a fairly large PE, we'll start her on heparin, Will admit her to telemetry we'll start her on heparin and Coumadin. Last pharmacy to dose heparin and Coumadin. She's not up-to-date and her Pap smears or colonoscopies this will have to be further this as an outpatient. She does have a thyroid mass which is concerning for malignancy these T. thyroid mass. At this time she's currently stable satting greater than 90% on room air blood pressure has been stable.   Will hold her blood pressure medication. And if the blood pressure starts to increase we'll go ahead and restart it.  Leukocytosis (10/07/2011)  Is probably stress emargination. She relates no cough no fevers no burning she urinates we'll continue to monitor her fever curve and check a CBC tomorrow morning.   Thyroid mass (10/07/2011)  at this time biopsy cannot be performed due to her PT is a have to be further workup as an outpatient.  Hyponatremia (10/07/2011) Check urinary sodium and creatinine. Her chloride is low is most likely secondary to decreased by mouth intake or/and decreased cardiac output due to the large PE. We'll monitor vitals closely.    Code Status: Full COde Family Communication:  Significant other [phone]   Charlynne Cousins M.D. [facility] [phone] 10/07/2011, 8:08 AM

## 2011-10-07 NOTE — Progress Notes (Addendum)
Pt here with c/o epigastric pain, chest pain associated with sob.  Pt said the chest pain has been going on for several days and she thought it would go away on its own. Tonight, it worsened and she came to seek help. Describes pain as dull and sharp . 10/10 on arrival to ER.

## 2011-10-07 NOTE — Progress Notes (Addendum)
ANTICOAGULATION CONSULT NOTE - Initial Consult  Pharmacy Consult for Heparin Indication: pulmonary embolus  Allergies  Allergen Reactions  . Codeine Other (See Comments)    hallucinations  . Penicillins Hives, Swelling and Rash    All over the body.  . Percocet (Oxycodone-Acetaminophen) Other (See Comments)    Causes syncope day after medication has been taken.  . Vicodin (Hydrocodone-Acetaminophen) Other (See Comments)    Causes syncope day after medication has been taken.    Patient Measurements: Height: 5' 2"  (157.5 cm) Weight: 196 lb (88.905 kg) IBW/kg (Calculated) : 50.1  Heparin Dosing Weight: 70.5 kg  Vital Signs: Temp: 99 F (37.2 C) (03/16 1207) Temp src: Oral (03/16 1207) BP: 135/67 mmHg (03/16 1207) Pulse Rate: 66  (03/16 1207)  Labs:  Basename 10/07/11 1250 10/07/11 0318  HGB -- 13.5  HCT -- 40.1  PLT -- 246  APTT -- --  LABPROT -- 13.9  INR -- 1.05  HEPARINUNFRC 0.60 --  CREATININE -- 0.74  CKTOTAL -- --  CKMB -- --  TROPONINI -- <0.30   Estimated Creatinine Clearance: 78.4 ml/min (by C-G formula based on Cr of 0.74).  Medical History: Past Medical History  Diagnosis Date  . Hyperlipidemia   . Thyroid disease   . Abdominal pain   . Post concussion syndrome   . Ventral hernia   . Hypertension     PCP DR Nolon Rod  . Recurrent upper respiratory infection (URI)     FLU 08/23/11-08/29/11 then URI 08/29/11- 09/08/11- S/P zpack and steroids-  improved      no cough or congestion  . Hypothyroidism   . Post concussion syndrome     4 yrs ago- sees [facility]- Dr Leonie Man every 6 months-   has sleep study 2011 following accident but was neg per patient-  short term memory issues, dates and times  . Headache     Medications:  Scheduled:     . aspirin  324 mg Oral Once  . heparin  2,000 Units Intravenous Once  .  morphine injection  4 mg Intravenous Once  .  morphine injection  4 mg Intravenous Once  . ondansetron  4 mg Intravenous Once  . sodium  chloride  1,000 mL Intravenous Once   Infusions:     . heparin 2,000 Units/hr (10/07/11 2574)    Assessment: 61 year old female s/p hernia surgery 8 days ago.  Admitted with a very large right sided pulmonary embolism on IV heparin.  First heparin level is therapeutic CrCl 78 ml/min  Goal of Therapy:  Heparin level 0.3-0.7 units/ml   Plan:  1.) Continue heparin gtt at 1200 units/hr 2.) Repeat 6 hour heparin level   Borgerding, Gaye Alken PharmD 2:07 PM 10/07/2011   Repeat heparin level 0.31units/ml on 1200units/hr, still in therapeutic range but down significantly and patient with large PE. No bleeding reported/documented.  Plan: Increase heparin to 1400units/hr and recheck in about 6hrs.  Romeo Rabon, PharmD, pager [phone]. 10/07/2011,8:52 PM.

## 2011-10-07 NOTE — Progress Notes (Signed)
Pt says now pain is better, rates it a 4/10. Pt talking complete sentences, no issues to note. Vitals stable. Pt able to ambulate to the bathroom

## 2011-10-07 NOTE — Progress Notes (Signed)
ANTICOAGULATION CONSULT NOTE - Initial Consult  Pharmacy Consult for Heparin Indication: pulmonary embolus  Allergies  Allergen Reactions  . Codeine Other (See Comments)    hallucinations  . Penicillins Hives, Swelling and Rash    All over the body.  . Percocet (Oxycodone-Acetaminophen) Other (See Comments)    Causes syncope day after medication has been taken.  . Vicodin (Hydrocodone-Acetaminophen) Other (See Comments)    Causes syncope day after medication has been taken.    Patient Measurements: Height: 5' 2"  (157.5 cm) Weight: 196 lb (88.905 kg) IBW/kg (Calculated) : 50.1  Heparin Dosing Weight: 70.5 kg  Vital Signs: Temp: 99.5 F (37.5 C) (03/16 0255) Temp src: Oral (03/16 0255) BP: 155/106 mmHg (03/16 0255) Pulse Rate: 88  (03/16 0255)  Labs:  Basename 10/07/11 0318  HGB 13.5  HCT 40.1  PLT 246  APTT --  LABPROT 13.9  INR 1.05  HEPARINUNFRC --  CREATININE 0.74  CKTOTAL --  CKMB --  TROPONINI <0.30   Estimated Creatinine Clearance: 78.4 ml/min (by C-G formula based on Cr of 0.74).  Medical History: Past Medical History  Diagnosis Date  . Hyperlipidemia   . Thyroid disease   . Abdominal pain   . Post concussion syndrome   . Ventral hernia   . Hypertension     PCP DR Nolon Rod  . Recurrent upper respiratory infection (URI)     FLU 08/23/11-08/29/11 then URI 08/29/11- 09/08/11- S/P zpack and steroids-  improved      no cough or congestion  . Hypothyroidism   . Post concussion syndrome     4 yrs ago- sees [facility]- Dr Leonie Man every 6 months-   has sleep study 2011 following accident but was neg per patient-  short term memory issues, dates and times  . Headache     Medications:  Scheduled:    . aspirin  324 mg Oral Once  .  morphine injection  4 mg Intravenous Once  .  morphine injection  4 mg Intravenous Once  . ondansetron  4 mg Intravenous Once  . sodium chloride  1,000 mL Intravenous Once   Infusions:    Assessment: 61 year old female  s/p hernia surgery 8 days ago.  Complains of epigastric and back pain x 2 days.  CT reveals pulmonary embolism.  IV heparin to begin.    Goal of Therapy:  Heparin level 0.3-0.7 units/ml   Plan:  1.  Heparin 2000 unit IV bolus x 1 2.  Heparin drip @ 1200 units/hr 3.  Check heparin level 6 hours after heparin started 4.  Check daily heparin level & CBC  Alastor Kneale Trefz 10/07/2011,6:35 AM

## 2011-10-07 NOTE — ED Notes (Signed)
Patient transported to CT 

## 2011-10-08 LAB — COMPREHENSIVE METABOLIC PANEL
ALT: 155 U/L — ABNORMAL HIGH (ref 0–35)
AST: 119 U/L — ABNORMAL HIGH (ref 0–37)
Calcium: 8.8 mg/dL (ref 8.4–10.5)
Sodium: 136 mEq/L (ref 135–145)
Total Protein: 6.4 g/dL (ref 6.0–8.3)

## 2011-10-08 LAB — CBC
HCT: 34.9 % — ABNORMAL LOW (ref 36.0–46.0)
Hemoglobin: 11.2 g/dL — ABNORMAL LOW (ref 12.0–15.0)
MCH: 27.9 pg (ref 26.0–34.0)
MCHC: 32.1 g/dL (ref 30.0–36.0)

## 2011-10-08 LAB — SODIUM, URINE, RANDOM: Sodium, Ur: 20 mEq/L

## 2011-10-08 MED ORDER — WARFARIN - PHARMACIST DOSING INPATIENT
Freq: Every day | Status: DC
  Administered 2011-10-08: 18:00:00

## 2011-10-08 MED ORDER — WARFARIN VIDEO
Freq: Once | Status: AC
  Administered 2011-10-08: 13:00:00

## 2011-10-08 MED ORDER — WARFARIN SODIUM 7.5 MG PO TABS
7.5000 mg | ORAL_TABLET | Freq: Once | ORAL | Status: AC
  Administered 2011-10-08: 7.5 mg via ORAL
  Filled 2011-10-08: qty 1

## 2011-10-08 MED ORDER — COUMADIN BOOK
1.0000 | Freq: Once | Status: AC
  Administered 2011-10-08: 1
  Filled 2011-10-08: qty 1

## 2011-10-08 NOTE — Progress Notes (Signed)
10-08-11 nsg:  Pt reports "I normally take 4ms of HCTZ QHS"  We do not have an order for this - do you want it ordered?

## 2011-10-08 NOTE — Progress Notes (Signed)
Subjective: No complaints. She relates her pain has been controlled with the IV. Objective: Filed Vitals:   10/07/11 1752 10/07/11 2200 10/08/11 0251 10/08/11 0451  BP:  128/77 165/87 118/75  Pulse:  71 79 64  Temp:  98.9 F (37.2 C) 98.4 F (36.9 C) 97.6 F (36.4 C)  TempSrc:  Oral Oral Oral  Resp: 16 17 22 19   Height:      Weight:      SpO2:  94% 100% 95%   Weight change:   Intake/Output Summary (Last 24 hours) at 10/08/11 0847 Last data filed at 10/08/11 0400  Gross per 24 hour  Intake   2388 ml  Output    850 ml  Net   1538 ml    General: Alert, awake, oriented x3, in no acute distress.  HEENT: No bruits, no goiter.  Heart: Regular rate and rhythm, without murmurs, rubs, gallops.  Lungs: Good air movement clear to auscultation Abdomen: Soft, nontender, nondistended, positive bowel sounds.  Neuro: Grossly intact, nonfocal.   Lab Results:  [facility] 10/08/11 0525 10/07/11 0318  NA 136 131*  K 3.8 3.4*  CL 98 92*  CO2 32 28  GLUCOSE 100* 121*  BUN 17 19  CREATININE 0.63 0.74  CALCIUM 8.8 9.6  MG -- --  PHOS -- --    Basename 10/08/11 0525  AST 119*  ALT 155*  ALKPHOS 151*  BILITOT 1.0  PROT 6.4  ALBUMIN 2.8*    Basename 10/07/11 0318  LIPASE 35  AMYLASE --    Basename 10/08/11 0525 10/07/11 0318  WBC 10.3 12.1*  NEUTROABS -- 9.2*  HGB 11.2* 13.5  HCT 34.9* 40.1  MCV 87.0 85.1  PLT 239 246    Basename 10/07/11 0318  CKTOTAL --  CKMB --  CKMBINDEX --  TROPONINI <0.30   No components found with this basename: POCBNP:3 No results found for this basename: DDIMER:2 in the last 72 hours No results found for this basename: HGBA1C:2 in the last 72 hours No results found for this basename: CHOL:2,HDL:2,LDLCALC:2,TRIG:2,CHOLHDL:2,LDLDIRECT:2 in the last 72 hours No results found for this basename: TSH,T4TOTAL,FREET3,T3FREE,THYROIDAB in the last 72 hours No results found for this basename:  VITAMINB12:2,FOLATE:2,FERRITIN:2,TIBC:2,IRON:2,RETICCTPCT:2 in the last 72 hours  Micro Results: No results found for this or any previous visit (from the past 240 hour(s)).  Studies/Results: Ct Angio Chest W/cm &/or Wo Cm  10/07/2011  *RADIOLOGY REPORT*  Clinical Data: Shortness of breath with deep inspiration; epigastric and left upper quadrant abdominal pain.  History of abdominal hernia repair 8 days ago.  CT ANGIOGRAPHY CHEST  Technique:  Multidetector CT imaging of the chest using the standard protocol during bolus administration of intravenous contrast. Multiplanar reconstructed images including MIPs were obtained and reviewed to evaluate the vascular anatomy.  Contrast: 173m OMNIPAQUE IOHEXOL 300 MG/ML IJ SOLN  Comparison: Chest radiograph performed 09/12/2011  Findings: There is a large pulmonary embolus at the bifurcation of the pulmonary artery to the right middle and lower lung lobes, extending distally.  Additional segmental and subsegmental pulmonary emboli are noted to all lobes of both lungs.  Small bilateral pleural effusions are noted; pulmonary infarcts are seen at both lower lobes, more prominent on the right, with associated atelectasis.  There is no evidence of pneumothorax.  No masses are identified; no abnormal focal contrast enhancement is seen.  The mediastinum is unremarkable in appearance.  No mediastinal lymphadenopathy is seen.  No pericardial effusion is identified. The great vessels are unremarkable in appearance.  No axillary  lymphadenopathy is seen.  There is a heterogeneous 3.6 cm mass arising at the inferior aspect of the left thyroid lobe; this raises concern for malignancy. Thyroid ultrasound would be helpful for further evaluation; would correlate with thyroid labs, and evaluate further as deemed clinically appropriate.  The visualized portions of the liver and spleen are unremarkable.  No acute osseous abnormalities are seen.  IMPRESSION:  1.  Large pulmonary embolus  at the bifurcation of the pulmonary artery to the right middle and lower lung lobes, extending distally; additional segmental and subsegmental pulmonary emboli noted to all lobes of both lungs. 2.  Small bilateral pleural effusions, with pulmonary infarcts at the lower lobes, more prominent on the right, and associated atelectasis. 3.  Heterogeneous 3.6 cm mass arising at the inferior aspect of the left thyroid lobe; this raises concern for malignancy.  Thyroid ultrasound would be helpful for further evaluation; would correlate with thyroid labs, and evaluate further as deemed clinically appropriate.  Critical Value/emergent results were called by telephone at the time of interpretation on 10/07/2011  at 05:54 a.m.  to  Dr. Chauncy Passy, who verbally acknowledged these results.  Original Report Authenticated By: Santa Lighter, M.D.   Dg Abd Acute W/chest  10/07/2011  *RADIOLOGY REPORT*  Clinical Data: Abdominal pain and mid chest pain; difficulty with deep inspiration.  Recent hernia surgery.  ACUTE ABDOMEN SERIES (ABDOMEN 2 VIEW & CHEST 1 VIEW)  Comparison: Chest radiograph performed 09/12/2011, and CT of the abdomen and pelvis performed 12/22/2009  Findings: The lungs are well-aerated.  Vascular congestion is noted; peribronchial thickening is seen.  Mild left basilar atelectasis is noted.  There is no evidence of pleural effusion or pneumothorax.  The cardiomediastinal silhouette is within normal limits.  The visualized bowel gas pattern is unremarkable.  Scattered stool and air are seen within the colon; there is no evidence of small bowel dilatation to suggest obstruction.  No free intra-abdominal air is identified on the provided upright view.  Clips are noted within the right upper quadrant, reflecting prior cholecystectomy.  No acute osseous abnormalities are seen; the sacroiliac joints are unremarkable in appearance.  IMPRESSION:  1.  Unremarkable bowel gas pattern; no free intra-abdominal air seen. 2.   Vascular congestion and peribronchial thickening noted.  Mild left basilar atelectasis seen.  Original Report Authenticated By: Santa Lighter, M.D.    Medications: I have reviewed the patient's current medications.  Assessment and plan: Active Problems:  Acute pulmonary embolism, Continue heparin and Coumadin per pharmacy.   Leukocytosis Resolve this probably stress emargination secondary to pulmonary embolism.   Thyroid mass Any further evaluation as an outpatient by her primary care Dr.   Hyponatremia Does resolve with IV fluids is probably secondary to decreased intravascular volume.     LOS: 1 day   Charlynne Cousins M.D. Pager: [phone] [facility] 10/08/2011, 8:47 AM

## 2011-10-08 NOTE — Progress Notes (Addendum)
ANTICOAGULATION CONSULT NOTE -[place] for Heparin Indication: pulmonary embolus  Allergies  Allergen Reactions  . Codeine Other (See Comments)    hallucinations  . Penicillins Hives, Swelling and Rash    All over the body.  . Percocet (Oxycodone-Acetaminophen) Other (See Comments)    Causes syncope day after medication has been taken.  . Vicodin (Hydrocodone-Acetaminophen) Other (See Comments)    Causes syncope day after medication has been taken.    Patient Measurements: Height: 5' 2"  (157.5 cm) Weight: 196 lb (88.905 kg) IBW/kg (Calculated) : 50.1  Heparin Dosing Weight: 70.5 kg  Vital Signs: Temp: 97.6 F (36.4 C) (03/17 0451) Temp src: Oral (03/17 0451) BP: 118/75 mmHg (03/17 0451) Pulse Rate: 64  (03/17 0451)  Labs:  Basename 10/08/11 0525 10/07/11 1940 10/07/11 1250 10/07/11 0318  HGB 11.2* -- -- 13.5  HCT 34.9* -- -- 40.1  PLT 239 -- -- 246  APTT -- -- -- --  LABPROT -- -- -- 13.9  INR -- -- -- 1.05  HEPARINUNFRC 0.49 0.31 0.60 --  CREATININE 0.63 -- -- 0.74  CKTOTAL -- -- -- --  CKMB -- -- -- --  TROPONINI -- -- -- <0.30   Estimated Creatinine Clearance: 78.4 ml/min (by C-G formula based on Cr of 0.63).  Medical History: Past Medical History  Diagnosis Date  . Hyperlipidemia   . Thyroid disease   . Abdominal pain   . Post concussion syndrome   . Ventral hernia   . Hypertension     PCP DR Nolon Rod  . Recurrent upper respiratory infection (URI)     FLU 08/23/11-08/29/11 then URI 08/29/11- 09/08/11- S/P zpack and steroids-  improved      no cough or congestion  . Hypothyroidism   . Post concussion syndrome     4 yrs ago- sees [facility]- Dr Leonie Man every 6 months-   has sleep study 2011 following accident but was neg per patient-  short term memory issues, dates and times  . Headache     Medications:  Scheduled:     . atorvastatin  20 mg Oral q1800  . divalproex  500 mg Oral QHS  . heparin  2,000 Units Intravenous  Once  . sodium chloride  3 mL Intravenous Q12H  . traZODone  50 mg Oral QHS   Infusions:     . sodium chloride 75 mL/hr (10/07/11 2322)  . heparin 1,400 Units/hr (10/07/11 2328)    Assessment: 61 year old female s/p recent hernia surgery.  Admitted with a very large right sided pulmonary embolism on IV heparin.   Heparin level  = 0.49 on heparin drip @ 1400 units/hr  No complications of therapy noted  Goal of Therapy:  Heparin level 0.3-0.7 units/ml   Plan:  1.) Continue heparin gtt at 1400 units/hr 2.) Repeat 6 hour heparin level   Everette Rank PharmD 6:32 AM 10/08/2011  ADDENDUM: Heparin level remains therapeutic (0.52). Continue heparin at 1400 units/hr (14 ml/hr). F/U daily heparin levels.  Verdia Kuba, PharmD Pager: [phone] 10/08/2011 1:17 PM

## 2011-10-08 NOTE — Progress Notes (Signed)
ANTICOAGULATION CONSULT NOTE - Initial Consult  Pharmacy Consult for warfarin Indication: pulmonary embolus  Allergies  Allergen Reactions  . Codeine Other (See Comments)    hallucinations  . Penicillins Hives, Swelling and Rash    All over the body.  . Percocet (Oxycodone-Acetaminophen) Other (See Comments)    Causes syncope day after medication has been taken.  . Vicodin (Hydrocodone-Acetaminophen) Other (See Comments)    Causes syncope day after medication has been taken.    Patient Measurements: Height: 5' 2"  (157.5 cm) Weight: 196 lb (88.905 kg) IBW/kg (Calculated) : 50.1  Heparin Dosing Weight:   Vital Signs: Temp: 97.6 F (36.4 C) (03/17 0451) Temp src: Oral (03/17 0451) BP: 118/75 mmHg (03/17 0451) Pulse Rate: 64  (03/17 0451)  Labs:  Basename 10/08/11 0525 10/07/11 1940 10/07/11 1250 10/07/11 0318  HGB 11.2* -- -- 13.5  HCT 34.9* -- -- 40.1  PLT 239 -- -- 246  APTT -- -- -- --  LABPROT -- -- -- 13.9  INR -- -- -- 1.05  HEPARINUNFRC 0.49 0.31 0.60 --  CREATININE 0.63 -- -- 0.74  CKTOTAL -- -- -- --  CKMB -- -- -- --  TROPONINI -- -- -- <0.30   Estimated Creatinine Clearance: 78.4 ml/min (by C-G formula based on Cr of 0.63).  Medical History: Past Medical History  Diagnosis Date  . Hyperlipidemia   . Thyroid disease   . Abdominal pain   . Post concussion syndrome   . Ventral hernia   . Hypertension     PCP DR Nolon Rod  . Recurrent upper respiratory infection (URI)     FLU 08/23/11-08/29/11 then URI 08/29/11- 09/08/11- S/P zpack and steroids-  improved      no cough or congestion  . Hypothyroidism   . Post concussion syndrome     4 yrs ago- sees [facility]- Dr Leonie Man every 6 months-   has sleep study 2011 following accident but was neg per patient-  short term memory issues, dates and times  . Headache      Assessment: Patient on heparin drip for treatment for VTE.    Goal of Therapy:  INR 2-3   Plan:  Daily INR, warfarin 7.4m po x1 at  [address] [place] 10/08/2011,9:02 AM

## 2011-10-09 LAB — HEPARIN LEVEL (UNFRACTIONATED): Heparin Unfractionated: 0.47 IU/mL (ref 0.30–0.70)

## 2011-10-09 LAB — PROTIME-INR: Prothrombin Time: 16.1 seconds — ABNORMAL HIGH (ref 11.6–15.2)

## 2011-10-09 MED ORDER — WARFARIN SODIUM 7.5 MG PO TABS
7.5000 mg | ORAL_TABLET | Freq: Once | ORAL | Status: AC
  Administered 2011-10-09: 7.5 mg via ORAL
  Filled 2011-10-09: qty 1

## 2011-10-09 MED ORDER — OXYCODONE HCL 5 MG PO TABS
5.0000 mg | ORAL_TABLET | ORAL | Status: DC | PRN
  Administered 2011-10-10: 5 mg via ORAL
  Filled 2011-10-09: qty 1

## 2011-10-09 NOTE — Progress Notes (Signed)
Subjective: No complaints.  Objective: Filed Vitals:   10/08/11 0451 10/08/11 1418 10/08/11 2200 10/09/11 0513  BP: 118/75 117/78 121/72 115/73  Pulse: 64 62 67 64  Temp: 97.6 F (36.4 C) 98.4 F (36.9 C) 98.1 F (36.7 C) 98.3 F (36.8 C)  TempSrc: Oral Oral Oral Oral  Resp: 19 20 18 18   Height:      Weight:      SpO2: 95% 96% 99% 91%   Weight change:   Intake/Output Summary (Last 24 hours) at 10/09/11 0844 Last data filed at 10/09/11 0300  Gross per 24 hour  Intake   2448 ml  Output   1925 ml  Net    523 ml    General: Alert, awake, oriented x3, in no acute distress.  HEENT: No bruits, no goiter.  Heart: Regular rate and rhythm, without murmurs, rubs, gallops.  Lungs: Good air movement clear to auscultation Abdomen: Soft, nontender, nondistended, positive bowel sounds.  Neuro: Grossly intact, nonfocal.   Lab Results:  [facility] 10/08/11 0525 10/07/11 0318  NA 136 131*  K 3.8 3.4*  CL 98 92*  CO2 32 28  GLUCOSE 100* 121*  BUN 17 19  CREATININE 0.63 0.74  CALCIUM 8.8 9.6  MG -- --  PHOS -- --    Basename 10/08/11 0525  AST 119*  ALT 155*  ALKPHOS 151*  BILITOT 1.0  PROT 6.4  ALBUMIN 2.8*    Basename 10/07/11 0318  LIPASE 35  AMYLASE --    Basename 10/08/11 0525 10/07/11 0318  WBC 10.3 12.1*  NEUTROABS -- 9.2*  HGB 11.2* 13.5  HCT 34.9* 40.1  MCV 87.0 85.1  PLT 239 246    Basename 10/07/11 0318  CKTOTAL --  CKMB --  CKMBINDEX --  TROPONINI <0.30   No components found with this basename: POCBNP:3 No results found for this basename: DDIMER:2 in the last 72 hours No results found for this basename: HGBA1C:2 in the last 72 hours No results found for this basename: CHOL:2,HDL:2,LDLCALC:2,TRIG:2,CHOLHDL:2,LDLDIRECT:2 in the last 72 hours No results found for this basename: TSH,T4TOTAL,FREET3,T3FREE,THYROIDAB in the last 72 hours No results found for this basename: VITAMINB12:2,FOLATE:2,FERRITIN:2,TIBC:2,IRON:2,RETICCTPCT:2 in the last 72  hours  Micro Results: No results found for this or any previous visit (from the past 240 hour(s)).  Studies/Results: No results found.  Medications: I have reviewed the patient's current medications.  Assessment and plan: Active Problems:  Acute pulmonary embolism, Continue heparin and Coumadin per pharmacy.   Leukocytosis Resolve this probably stress emargination secondary to pulmonary embolism.   Thyroid mass Any further evaluation as an outpatient by her primary care Dr.   Hyponatremia Resolved.    LOS: 2 days   Charlynne Cousins M.D. Pager: [phone] [facility] 10/09/2011, 8:44 AM

## 2011-10-09 NOTE — Progress Notes (Signed)
ANTICOAGULATION CONSULT NOTE - Initial Consult  Pharmacy Consult for warfarin Indication: pulmonary embolus  Allergies  Allergen Reactions  . Codeine Other (See Comments)    hallucinations  . Penicillins Hives, Swelling and Rash    All over the body.  . Percocet (Oxycodone-Acetaminophen) Other (See Comments)    Causes syncope day after medication has been taken.  . Vicodin (Hydrocodone-Acetaminophen) Other (See Comments)    Causes syncope day after medication has been taken.    Patient Measurements: Height: 5' 2"  (157.5 cm) Weight: 196 lb (88.905 kg) IBW/kg (Calculated) : 50.1   Vital Signs: Temp: 98.3 F (36.8 C) (03/18 0513) Temp src: Oral (03/18 0513) BP: 115/73 mmHg (03/18 0513) Pulse Rate: 64  (03/18 0513)  Labs:  Basename 10/09/11 0500 10/08/11 1145 10/08/11 0525 10/07/11 0318  HGB -- -- 11.2* 13.5  HCT -- -- 34.9* 40.1  PLT -- -- 239 246  APTT -- -- -- --  LABPROT 16.1* -- -- 13.9  INR 1.26 -- -- 1.05  HEPARINUNFRC 0.47 0.52 0.49 --  CREATININE -- -- 0.63 0.74  CKTOTAL -- -- -- --  CKMB -- -- -- --  TROPONINI -- -- -- <0.30   Estimated Creatinine Clearance: 78.4 ml/min (by C-G formula based on Cr of 0.63).  Medical History: Past Medical History  Diagnosis Date  . Hyperlipidemia   . Thyroid disease   . Abdominal pain   . Post concussion syndrome   . Ventral hernia   . Hypertension     PCP DR Nolon Rod  . Recurrent upper respiratory infection (URI)     FLU 08/23/11-08/29/11 then URI 08/29/11- 09/08/11- S/P zpack and steroids-  improved      no cough or congestion  . Hypothyroidism   . Post concussion syndrome     4 yrs ago- sees [facility]- Dr Leonie Man every 6 months-   has sleep study 2011 following accident but was neg per patient-  short term memory issues, dates and times  . Headache    Inpatient warfarin doses: 3/17   Assessment:  61 yo F s/p recent hernia surgery. Admitted with a very large right sided pulmonary embolism; started on IV heparin  and warfarin.  Heparin infusing at 1400 units/hr  INR subtherapeutic at 1.26  Heparin level therapeutic at 0.47  No bleeding or complications noted  Goal of Therapy:  Heparin level 0.3-0.7 INR 2-3   Plan:   Continue heparin IV infusion at 1400 units/hr  Warfarin 7.59m po x1 at 1800  Daily INR and heparin level   CGretta ArabPharmD, BCPS Pager [phone]/18/2013 10:20 AM

## 2011-10-10 LAB — HEPARIN LEVEL (UNFRACTIONATED): Heparin Unfractionated: 0.4 IU/mL (ref 0.30–0.70)

## 2011-10-10 LAB — PROTIME-INR: Prothrombin Time: 26.5 seconds — ABNORMAL HIGH (ref 11.6–15.2)

## 2011-10-10 NOTE — Progress Notes (Addendum)
Subjective: No complaints.  Objective: Filed Vitals:   10/09/11 0513 10/09/11 1413 10/09/11 2210 10/10/11 0538  BP: 115/73 121/77 129/79 123/77  Pulse: 64 70 70 67  Temp: 98.3 F (36.8 C) 98.5 F (36.9 C) 99 F (37.2 C) 98.4 F (36.9 C)  TempSrc: Oral  Oral Oral  Resp: 18 17 18 18   Height:      Weight:      SpO2: 91% 95% 94% 97%   Weight change:   Intake/Output Summary (Last 24 hours) at 10/10/11 0835 Last data filed at 10/10/11 0600  Gross per 24 hour  Intake    534 ml  Output      1 ml  Net    533 ml    General: Alert, awake, oriented x3, in no acute distress.  HEENT: No bruits, no goiter.  Heart: Regular rate and rhythm, without murmurs, rubs, gallops.  Lungs: Good air movement clear to auscultation Abdomen: Soft, nontender, nondistended, positive bowel sounds.  Neuro: Grossly intact, nonfocal.   Lab Results:  [facility] 10/08/11 0525  NA 136  K 3.8  CL 98  CO2 32  GLUCOSE 100*  BUN 17  CREATININE 0.63  CALCIUM 8.8  MG --  PHOS --    Basename 10/08/11 0525  AST 119*  ALT 155*  ALKPHOS 151*  BILITOT 1.0  PROT 6.4  ALBUMIN 2.8*   No results found for this basename: LIPASE:2,AMYLASE:2 in the last 72 hours  Basename 10/08/11 0525  WBC 10.3  NEUTROABS --  HGB 11.2*  HCT 34.9*  MCV 87.0  PLT 239   No results found for this basename: CKTOTAL:3,CKMB:3,CKMBINDEX:3,TROPONINI:3 in the last 72 hours No components found with this basename: POCBNP:3 No results found for this basename: DDIMER:2 in the last 72 hours No results found for this basename: HGBA1C:2 in the last 72 hours No results found for this basename: CHOL:2,HDL:2,LDLCALC:2,TRIG:2,CHOLHDL:2,LDLDIRECT:2 in the last 72 hours No results found for this basename: TSH,T4TOTAL,FREET3,T3FREE,THYROIDAB in the last 72 hours No results found for this basename: VITAMINB12:2,FOLATE:2,FERRITIN:2,TIBC:2,IRON:2,RETICCTPCT:2 in the last 72 hours  Micro Results: No results found for this or any previous  visit (from the past 240 hour(s)).  Studies/Results: No results found.  Medications: I have reviewed the patient's current medications.  Assessment and plan: Active Problems:  Acute pulmonary embolism, Continue heparin and Coumadin per pharmacy. Home once 5 days of heparin and coumadin and INR >2.0. As her PE was significantly large. An was some what borderline hypotensive on admission.   Leukocytosis Resolve this probably stress emargination secondary to pulmonary embolism.   Thyroid mass Any further evaluation as an outpatient by her primary care Dr. Has a Thyroid mass which needs Bx.   Hyponatremia Resolved.   LOS: 3 days   Charlynne Cousins M.D. Pager: [phone] [facility] 10/10/2011, 8:35 AM

## 2011-10-10 NOTE — Progress Notes (Signed)
   CARE MANAGEMENT NOTE 10/10/2011  Patient:  CHEZNEY, HUETHER   Account Number:  [id number]  Date Initiated:  10/10/2011  Documentation initiated by:  [facility]  Subjective/Objective Assessment:   ADMITTED W/SOB.ACUTE PE.     Action/Plan:   FROM HOME.   Anticipated DC Date:  10/13/2011   Anticipated DC Plan:  HOME/SELF CARE         Choice offered to / List presented to:             Status of service:  In process, will continue to follow Medicare Important Message given?   (If response is "NO", the following Medicare IM given date fields will be blank) Date Medicare IM given:   Date Additional Medicare IM given:    Discharge Disposition:    Per UR Regulation:  Reviewed for med. necessity/level of care/duration of stay  If discussed at [place] of Stay Meetings, dates discussed:    Comments:  10/10/11 Shallyn Constancio RN,BSN NCM [address].

## 2011-10-10 NOTE — Progress Notes (Addendum)
ANTICOAGULATION CONSULT NOTE - Follow up Alyssa [name] for Heparin and warfarin Indication: pulmonary embolus  Allergies  Allergen Reactions  . Codeine Other (See Comments)    hallucinations  . Penicillins Hives, Swelling and Rash    All over the body.  . Percocet (Oxycodone-Acetaminophen) Other (See Comments)    Causes syncope day after medication has been taken.  . Vicodin (Hydrocodone-Acetaminophen) Other (See Comments)    Causes syncope day after medication has been taken.    Patient Measurements: Height: 5' 2"  (157.5 cm) Weight: 196 lb (88.905 kg) IBW/kg (Calculated) : 50.1   Vital Signs: Temp: 98.4 F (36.9 C) (03/19 0538) Temp src: Oral (03/19 0538) BP: 123/77 mmHg (03/19 0538) Pulse Rate: 67  (03/19 0538)  Labs:  Basename 10/10/11 0440 10/09/11 0500 10/08/11 1145 10/08/11 0525  HGB -- -- -- 11.2*  HCT -- -- -- 34.9*  PLT -- -- -- 239  APTT -- -- -- --  LABPROT 26.5* 16.1* -- --  INR 2.39* 1.26 -- --  HEPARINUNFRC 0.40 0.47 0.52 --  CREATININE -- -- -- 0.63  CKTOTAL -- -- -- --  CKMB -- -- -- --  TROPONINI -- -- -- --   Estimated Creatinine Clearance: 78.4 ml/min (by C-G formula based on Cr of 0.63).  Medical History: Past Medical History  Diagnosis Date  . Hyperlipidemia   . Thyroid disease   . Abdominal pain   . Post concussion syndrome   . Ventral hernia   . Hypertension     PCP DR Nolon Rod  . Recurrent upper respiratory infection (URI)     FLU 08/23/11-08/29/11 then URI 08/29/11- 09/08/11- S/P zpack and steroids-  improved      no cough or congestion  . Hypothyroidism   . Post concussion syndrome     4 yrs ago- sees [facility]- Dr Leonie Man every 6 months-   has sleep study 2011 following accident but was neg per patient-  short term memory issues, dates and times  . Headache    Inpatient warfarin doses: 3/17 7.3m, 3/18 7.549m Assessment:  [age]o F s/p recent hernia surgery. Admitted with a very large right sided pulmonary embolism;  started on IV heparin and warfarin.  Heparin infusing at 1400 units/hr  INR therapeutic after a large increase 1.26 -> 2.39   Heparin level therapeutic at 0.4  No bleeding or complications noted  Goal of Therapy:  Heparin level 0.3-0.7 INR 2-3   Plan:   Continue heparin IV infusion at 1400 units/hr  Warfarin held  Daily INR and heparin level   ChGretta ArabharmD, BCPS Pager [phone]/19/2013 7:26 AM

## 2011-10-11 LAB — PROTIME-INR: INR: 2.35 — ABNORMAL HIGH (ref 0.00–1.49)

## 2011-10-11 MED ORDER — WARFARIN SODIUM 5 MG PO TABS
5.0000 mg | ORAL_TABLET | Freq: Once | ORAL | Status: AC
  Administered 2011-10-11: 5 mg via ORAL
  Filled 2011-10-11: qty 1

## 2011-10-11 MED ORDER — LORATADINE 10 MG PO TABS
10.0000 mg | ORAL_TABLET | Freq: Every day | ORAL | Status: DC
  Administered 2011-10-11 – 2011-10-12 (×2): 10 mg via ORAL
  Filled 2011-10-11 (×3): qty 1

## 2011-10-11 NOTE — Progress Notes (Addendum)
ANTICOAGULATION CONSULT NOTE - Follow up [name] for Heparin and warfarin Indication: pulmonary embolus  Allergies  Allergen Reactions  . Codeine Other (See Comments)    hallucinations  . Penicillins Hives, Swelling and Rash    All over the body.  . Percocet (Oxycodone-Acetaminophen) Other (See Comments)    Causes syncope day after medication has been taken.  . Vicodin (Hydrocodone-Acetaminophen) Other (See Comments)    Causes syncope day after medication has been taken.    Patient Measurements: Height: 5' 2"  (157.5 cm) Weight: 196 lb (88.905 kg) IBW/kg (Calculated) : 50.1   Vital Signs: Temp: 98.9 F (37.2 C) (03/20 0512) Temp src: Oral (03/20 0512) BP: 137/78 mmHg (03/20 0512) Pulse Rate: 70  (03/20 0512)  Labs:  Flo Shanks 10/11/11 0520 10/10/11 0440 10/09/11 0500  HGB -- -- --  HCT -- -- --  PLT -- -- --  APTT -- -- --  LABPROT 26.1* 26.5* 16.1*  INR 2.35* 2.39* 1.26  HEPARINUNFRC 0.30 0.40 0.47  CREATININE -- -- --  CKTOTAL -- -- --  CKMB -- -- --  TROPONINI -- -- --   Estimated Creatinine Clearance: 78.4 ml/min (by C-G formula based on Cr of 0.63).  Medical History: Past Medical History  Diagnosis Date  . Hyperlipidemia   . Thyroid disease   . Abdominal pain   . Post concussion syndrome   . Ventral hernia   . Hypertension     PCP DR Nolon Rod  . Recurrent upper respiratory infection (URI)     FLU 08/23/11-08/29/11 then URI 08/29/11- 09/08/11- S/P zpack and steroids-  improved      no cough or congestion  . Hypothyroidism   . Post concussion syndrome     4 yrs ago- sees [facility]- Dr Leonie Man every 6 months-   has sleep study 2011 following accident but was neg per patient-  short term memory issues, dates and times  . Headache    Inpatient warfarin doses:  3/17= 7.9m, 3/18 =7.567m 3/19 = none  Assessment:  [age]o F s/p recent hernia surgery. Admitted with a very large right sided pulmonary embolism; started on IV heparin and  warfarin.  Heparin infusing at 1400 units/hr  INR remains therapeutic   Now warfarin given last night due to large INR jump   Heparin level therapeutic at 0.3, but at low end of goal range  No bleeding or complications noted  Today is Day #4 of 5 day minimum overlap with heparin and warfarin  Goal of Therapy:  Heparin level 0.3-0.7 INR 2-3   Plan:   Increase heparin to 1500 units/hr  Recheck heparin level 6 hours after rate increase  Warfarin 48m60mO x1 tonight  Daily INR and heparin level  JesVerdia KubaharmD Pager: [phone]/2013 7:19 AM   ADDENDUM: Repeat heparin level = 0.41, remains in goal range. No bleeding reported. Continue heparin at 1500 units/hr. F/U am heparin level. Continue IV heparin through 3/21 to complete required 5 day minimum overlap.  JesVerdia KubaharmD Pager: [phone]/2013. 3:14 PM

## 2011-10-11 NOTE — Progress Notes (Signed)
Subjective: C/o of diarrhea today, no abd pain, no n/v. Pt denies cp, no sob. Objective: Filed Vitals:   10/10/11 2003 10/10/11 2106 10/11/11 0512 10/11/11 1300  BP: 135/84 143/77 137/78 129/76  Pulse: 74 70 70 76  Temp:  98.5 F (36.9 C) 98.9 F (37.2 C) 98.7 F (37.1 C)  TempSrc:  Oral Oral   Resp: 20 19 18 22   Height:      Weight:      SpO2: 98% 98% 97% 99%   Weight change:   Intake/Output Summary (Last 24 hours) at 10/11/11 1853 Last data filed at 10/11/11 0600  Gross per 24 hour  Intake    944 ml  Output      0 ml  Net    944 ml    General: Alert, awake, oriented x3, in no acute distress.  HEENT: No bruits, no goiter.  Heart: Regular rate and rhythm, without murmurs, rubs, gallops.  Lungs: Good air movement clear to auscultation Abdomen: Soft, nontender, nondistended, positive bowel sounds.  Neuro: Grossly intact, nonfocal.   Lab Results: No results found for this basename: NA:5,K:5,CL:5,CO2:5,GLUCOSE:5,BUN:5,CREATININE:5,CALCIUM:5,MG:5,PHOS:5 in the last 72 hours No results found for this basename: AST:2,ALT:2,ALKPHOS:2,BILITOT:2,PROT:2,ALBUMIN:2 in the last 72 hours No results found for this basename: LIPASE:2,AMYLASE:2 in the last 72 hours No results found for this basename: WBC:2,NEUTROABS:2,HGB:2,HCT:2,MCV:2,PLT:2 in the last 72 hours No results found for this basename: CKTOTAL:3,CKMB:3,CKMBINDEX:3,TROPONINI:3 in the last 72 hours No components found with this basename: POCBNP:3 No results found for this basename: DDIMER:2 in the last 72 hours No results found for this basename: HGBA1C:2 in the last 72 hours No results found for this basename: CHOL:2,HDL:2,LDLCALC:2,TRIG:2,CHOLHDL:2,LDLDIRECT:2 in the last 72 hours No results found for this basename: TSH,T4TOTAL,FREET3,T3FREE,THYROIDAB in the last 72 hours No results found for this basename: VITAMINB12:2,FOLATE:2,FERRITIN:2,TIBC:2,IRON:2,RETICCTPCT:2 in the last 72 hours  Micro Results: No results found for  this or any previous visit (from the past 240 hour(s)).  Studies/Results: No results found.  Medications: I have reviewed the patient's current medications.  Assessment and plan: Active Problems:  Acute pulmonary embolism, Continue heparin and Coumadin per pharmacy. Home once 5 days of heparin and coumadin and INR >2.0. As her PE was significantly large, and was some what borderline hypotensive on admission. -remaining hemodynamically satble and symptoms improved  Leukocytosis Resolve this probably stress demargination secondary to pulmonary embolism.   Thyroid mass Will need further evaluation as an outpatient by her primary care Dr. Has a Thyroid mass which needs Bx.   Hyponatremia Resolved.  Diarrhea -stool/c.diff stuies and follow  LOS: 4 days   [name] Oats M.D. Pager: [phone] [facility] 10/11/2011, 6:53 PM

## 2011-10-12 ENCOUNTER — Encounter (INDEPENDENT_AMBULATORY_CARE_PROVIDER_SITE_OTHER): Payer: Self-pay

## 2011-10-12 LAB — BASIC METABOLIC PANEL
BUN: 7 mg/dL (ref 6–23)
CO2: 30 mEq/L (ref 19–32)
Calcium: 9.3 mg/dL (ref 8.4–10.5)
Creatinine, Ser: 0.67 mg/dL (ref 0.50–1.10)
Glucose, Bld: 93 mg/dL (ref 70–99)

## 2011-10-12 LAB — CBC
HCT: 33.4 % — ABNORMAL LOW (ref 36.0–46.0)
MCH: 28.4 pg (ref 26.0–34.0)
MCV: 86.1 fL (ref 78.0–100.0)
Platelets: 375 10*3/uL (ref 150–400)
RBC: 3.88 MIL/uL (ref 3.87–5.11)

## 2011-10-12 LAB — PROTIME-INR
INR: 2.78 — ABNORMAL HIGH (ref 0.00–1.49)
Prothrombin Time: 29.8 seconds — ABNORMAL HIGH (ref 11.6–15.2)

## 2011-10-12 MED ORDER — WARFARIN SODIUM 3 MG PO TABS: 3.0000 mg | ORAL_TABLET | Freq: Once | ORAL | Status: AC

## 2011-10-12 MED ORDER — POTASSIUM CHLORIDE CRYS ER 20 MEQ PO TBCR
60.0000 meq | EXTENDED_RELEASE_TABLET | Freq: Once | ORAL | Status: AC
  Administered 2011-10-12: 60 meq via ORAL
  Filled 2011-10-12: qty 3

## 2011-10-12 MED ORDER — TRAMADOL HCL 50 MG PO TABS: 50.0000 mg | ORAL_TABLET | Freq: Four times a day (QID) | ORAL | Status: DC | PRN

## 2011-10-12 MED ORDER — WARFARIN SODIUM 3 MG PO TABS
3.0000 mg | ORAL_TABLET | Freq: Once | ORAL | Status: AC
  Administered 2011-10-12: 3 mg via ORAL
  Filled 2011-10-12: qty 1

## 2011-10-12 NOTE — Discharge Summary (Signed)
Discharge Note  Name: Alyssa [name] MRN: [id number] DOB: 07-20-1951 61 y.o.  Date of Admission: 10/07/2011  2:33 AM Date of Discharge: 10/12/2011 Attending Physician: [name] Oats, MD  Discharge Diagnosis: Active Problems:  Acute pulmonary embolism  Leukocytosis  Thyroid mass  Hyponatremia   Discharge Medications: Medication List  As of 10/12/2011  5:07 PM   TAKE these medications         cetirizine 10 MG tablet   Commonly known as: ZYRTEC   Take 10 mg by mouth every evening.      divalproex 500 MG DR tablet   Commonly known as: DEPAKOTE   Take 500 mg by mouth at bedtime.      hydrochlorothiazide 25 MG tablet   Commonly known as: HYDRODIURIL   Take 25 mg by mouth at bedtime.      rosuvastatin 10 MG tablet   Commonly known as: CRESTOR   Take 10 mg by mouth at bedtime.      traMADol 50 MG tablet   Commonly known as: ULTRAM   Take 1-2 tablets (50-100 mg total) by mouth every 6 (six) hours as needed.      traZODone 50 MG tablet   Commonly known as: DESYREL   Take 50 mg by mouth at bedtime.      warfarin 3 MG tablet   Commonly known as: COUMADIN   Take 1 tablet (3 mg total) by mouth one time only at 6 PM.            Disposition and follow-up:   [name] [facility] in improved/stable condition.    Follow-up Appointments: Discharge Orders    Future Appointments: Provider: Department: Dept Phone: Center:   10/16/2011 10:45 AM Adin Hector, MD [facility] [phone] None     Future Orders Please Complete By Expires   Diet general      Increase activity slowly         Consultations:    Procedures Performed:  Ct Angio Chest W/cm &/or Wo Cm  10/07/2011  *RADIOLOGY REPORT*  Clinical Data: Shortness of breath with deep inspiration; epigastric and left upper quadrant abdominal pain.  History of abdominal hernia repair 8 days ago.  CT ANGIOGRAPHY CHEST  Technique:  Multidetector CT imaging of the chest  using the standard protocol during bolus administration of intravenous contrast. Multiplanar reconstructed images including MIPs were obtained and reviewed to evaluate the vascular anatomy.  Contrast: 152m OMNIPAQUE IOHEXOL 300 MG/ML IJ SOLN  Comparison: Chest radiograph performed 09/12/2011  Findings: There is a large pulmonary embolus at the bifurcation of the pulmonary artery to the right middle and lower lung lobes, extending distally.  Additional segmental and subsegmental pulmonary emboli are noted to all lobes of both lungs.  Small bilateral pleural effusions are noted; pulmonary infarcts are seen at both lower lobes, more prominent on the right, with associated atelectasis.  There is no evidence of pneumothorax.  No masses are identified; no abnormal focal contrast enhancement is seen.  The mediastinum is unremarkable in appearance.  No mediastinal lymphadenopathy is seen.  No pericardial effusion is identified. The great vessels are unremarkable in appearance.  No axillary lymphadenopathy is seen.  There is a heterogeneous 3.6 cm mass arising at the inferior aspect of the left thyroid lobe; this raises concern for malignancy. Thyroid ultrasound would be helpful for further evaluation; would correlate with thyroid labs, and evaluate further as deemed clinically appropriate.  The visualized portions of the  liver and spleen are unremarkable.  No acute osseous abnormalities are seen.  IMPRESSION:  1.  Large pulmonary embolus at the bifurcation of the pulmonary artery to the right middle and lower lung lobes, extending distally; additional segmental and subsegmental pulmonary emboli noted to all lobes of both lungs. 2.  Small bilateral pleural effusions, with pulmonary infarcts at the lower lobes, more prominent on the right, and associated atelectasis. 3.  Heterogeneous 3.6 cm mass arising at the inferior aspect of the left thyroid lobe; this raises concern for malignancy.  Thyroid ultrasound would be helpful  for further evaluation; would correlate with thyroid labs, and evaluate further as deemed clinically appropriate.  Critical Value/emergent results were called by telephone at the time of interpretation on 10/07/2011  at 05:54 a.m.  to  Dr. Chauncy Passy, who verbally acknowledged these results.  Original Report Authenticated By: Santa Lighter, M.D.   Dg Abd Acute W/chest  10/07/2011  *RADIOLOGY REPORT*  Clinical Data: Abdominal pain and mid chest pain; difficulty with deep inspiration.  Recent hernia surgery.  ACUTE ABDOMEN SERIES (ABDOMEN 2 VIEW & CHEST 1 VIEW)  Comparison: Chest radiograph performed 09/12/2011, and CT of the abdomen and pelvis performed 12/22/2009  Findings: The lungs are well-aerated.  Vascular congestion is noted; peribronchial thickening is seen.  Mild left basilar atelectasis is noted.  There is no evidence of pleural effusion or pneumothorax.  The cardiomediastinal silhouette is within normal limits.  The visualized bowel gas pattern is unremarkable.  Scattered stool and air are seen within the colon; there is no evidence of small bowel dilatation to suggest obstruction.  No free intra-abdominal air is identified on the provided upright view.  Clips are noted within the right upper quadrant, reflecting prior cholecystectomy.  No acute osseous abnormalities are seen; the sacroiliac joints are unremarkable in appearance.  IMPRESSION:  1.  Unremarkable bowel gas pattern; no free intra-abdominal air seen. 2.  Vascular congestion and peribronchial thickening noted.  Mild left basilar atelectasis seen.  Original Report Authenticated By: Santa Lighter, M.D.     Admission HPI Is a 61 year old female with past medical history of hypothyroidism, recently discharged from the hospital for ventral hernia that comes in for sudden onset of shortness of breath and chest pain. She relates this started 2 days prior to admission. She not able to take deep breath as this makes her pain worse. She relates  she cannot walk more than 400 feet without getting short of breath. This progressively got worse until she decided to come to the ED. here in the ED her blood pressure has been stable, she is saturating above 90%. A CT angiogram the chest was done that showed very large right pulmonary emboli so we were asked to admit and further evaluate.  No nausea or vomiting no diarrhea. No long trips. He has not been up-to-date in her cancer screening (colonoscopy in cervical Pap smear). She relates no leg swelling. No recent travel history, no sick contacts  Physical exam General: Alert, awake, oriented x3, in no acute distress.  HEENT: No bruits, no goiter.  Heart: Regular rate and rhythm, without murmurs, rubs, gallops.  Lungs: Good air movement clear to auscultation  Abdomen: Soft, nontender, nondistended, positive bowel sounds.  Neuro: Grossly intact, nonfocal.     Hospital Course by problem list: Active Problems:  Acute pulmonary embolism  Leukocytosis  Thyroid mass  Hyponatremia ? Diarrhea-resolved, C. difficile negative.  Assessment and plan:  Active Problems:  Acute pulmonary embolism, As discussed above, upon  admission she was started on heparin and Coumadin as it was noted that her PE was significantly large, and was some what borderline hypotensive on admission. She had recently been hospitalized for ventral hernia surgery and this was thought to be the predisposing factor of for her PE. She has a history of hypothyroidism and was also found to have a heterogeneous 3.6 cm mass arising off the inferior aspect of her left thyroid lobe raising concern for malignancy, given that she needed to be anticoagulated for the acute PE, further workup of this mass and a referred to primary care physician outpatient. This was discussed with the patient and she voiced understanding and will follow up with Dr. Lynne Logan with [facility] for further management of her Coumadin as well as for the  evaluation of the thyroid mass. The heparin and Coumadin have been overlapped 5 days, her INR is therapeutic at 2.78 today. She has had no gross bleeding. She has remained asymptomatic-no further chest pain or shortness of breath on room air. she'll be discharged on oral Coumadin and is to follow up outpatient as already mentioned. Leukocytosis  The impression was that  this probably secondary to stress demargination, resolved.  Thyroid mass  Will need further evaluation as an outpatient by her primary care Dr. Lynne Logan -Thyroid mass which needs Bx.  Hyponatremia  Impression was that this was secondary to volume patient, resolved.  Diarrhea  Patient had loose stool/diarrhea in the hospital, stool studies were done and the C. difficile are came back negative. Her diarrhea has resolved.  Discharge Vitals:  BP 151/77  Pulse 56  Temp(Src) 98.6 F (37 C) (Oral)  Resp 18  Ht 5' 2"  (1.575 m)  Wt 88.905 kg (196 lb)  BMI 35.85 kg/m2  SpO2 95%  Discharge Labs:  Results for orders placed during the hospital encounter of 10/07/11 (from the past 24 hour(s))  HEPARIN LEVEL (UNFRACTIONATED)     Status: Normal   Collection Time   10/12/11  5:14 AM      Component Value Range   Heparin Unfractionated 0.38  0.30 - 0.70 (IU/mL)  PROTIME-INR     Status: Abnormal   Collection Time   10/12/11  5:14 AM      Component Value Range   Prothrombin Time 29.8 (*) 11.6 - 15.2 (seconds)   INR 2.78 (*) 0.00 - 1.49   CBC     Status: Abnormal   Collection Time   10/12/11  5:14 AM      Component Value Range   WBC 5.7  4.0 - 10.5 (K/uL)   RBC 3.88  3.87 - 5.11 (MIL/uL)   Hemoglobin 11.0 (*) 12.0 - 15.0 (g/dL)   HCT 33.4 (*) 36.0 - 46.0 (%)   MCV 86.1  78.0 - 100.0 (fL)   MCH 28.4  26.0 - 34.0 (pg)   MCHC 32.9  30.0 - 36.0 (g/dL)   RDW 13.6  11.5 - 15.5 (%)   Platelets 375  150 - 400 (K/uL)  BASIC METABOLIC PANEL     Status: Abnormal   Collection Time   10/12/11  5:14 AM      Component Value Range    Sodium 144  135 - 145 (mEq/L)   Potassium 3.3 (*) 3.5 - 5.1 (mEq/L)   Chloride 104  96 - 112 (mEq/L)   CO2 30  19 - 32 (mEq/L)   Glucose, Bld 93  70 - 99 (mg/dL)   BUN 7  6 - 23 (mg/dL)  Creatinine, Ser 0.67  0.50 - 1.10 (mg/dL)   Calcium 9.3  8.4 - 10.5 (mg/dL)   GFR calc non Af Amer >90  >90 (mL/min)   GFR calc Af Amer >90  >90 (mL/min)  CLOSTRIDIUM DIFFICILE BY PCR     Status: Normal   Collection Time   10/12/11  5:23 AM      Component Value Range   C difficile by pcr NEGATIVE  NEGATIVE     Signed: [name] Oats 10/12/2011, 5:07 PM

## 2011-10-12 NOTE — Progress Notes (Signed)
10/12/11 Patient was given discharge instructions. PIVs were removed. Patient is awaiting a ride home in a private vehicle driven by her significant other.

## 2011-10-12 NOTE — Progress Notes (Signed)
ANTICOAGULATION CONSULT NOTE - Follow up [facility] for Heparin and warfarin Indication: pulmonary embolus  Allergies  Allergen Reactions  . Codeine Other (See Comments)    hallucinations  . Penicillins Hives, Swelling and Rash    All over the body.  . Percocet (Oxycodone-Acetaminophen) Other (See Comments)    Causes syncope day after medication has been taken.  . Vicodin (Hydrocodone-Acetaminophen) Other (See Comments)    Causes syncope day after medication has been taken.    Patient Measurements: Height: 5' 2"  (157.5 cm) Weight: 196 lb (88.905 kg) IBW/kg (Calculated) : 50.1   Vital Signs: Temp: 98.7 F (37.1 C) (03/21 0539) Temp src: Oral (03/21 0539) BP: 146/78 mmHg (03/21 0539) Pulse Rate: 62  (03/21 0539)  Labs:  Basename 10/12/11 0514 10/11/11 1411 10/11/11 0520 10/10/11 0440  HGB 11.0* -- -- --  HCT 33.4* -- -- --  PLT 375 -- -- --  APTT -- -- -- --  LABPROT 29.8* -- 26.1* 26.5*  INR 2.78* -- 2.35* 2.39*  HEPARINUNFRC 0.38 0.41 0.30 --  CREATININE 0.67 -- -- --  CKTOTAL -- -- -- --  CKMB -- -- -- --  TROPONINI -- -- -- --   Estimated Creatinine Clearance: 78.4 ml/min (by C-G formula based on Cr of 0.67).  Medical History: Past Medical History  Diagnosis Date  . Hyperlipidemia   . Thyroid disease   . Abdominal pain   . Post concussion syndrome   . Ventral hernia   . Hypertension     PCP DR Nolon Rod  . Recurrent upper respiratory infection (URI)     FLU 08/23/11-08/29/11 then URI 08/29/11- 09/08/11- S/P zpack and steroids-  improved      no cough or congestion  . Hypothyroidism   . Post concussion syndrome     4 yrs ago- sees [facility]- Dr Leonie Man every 6 months-   has sleep study 2011 following accident but was neg per patient-  short term memory issues, dates and times  . Headache     Assessment:  61 yo F s/p recent hernia surgery. Admitted with a very large right sided pulmonary embolism; started on IV heparin and  warfarin.  Heparin infusing at 1500 units/hr  INR remains therapeutic   Heparin level therapeutic at 0.38  No bleeding or complications noted  Today is Day #5 of 5 day minimum overlap with heparin and warfarin  Goal of Therapy:  Heparin level 0.3-0.7 INR 2-3   Plan:   Continue heparin at 1500 units/hr (15 ml/hr)  Warfarin 68m PO x1 tonight  IV heparin can be stopped anytime after tonight's warfarin is given (which should be around 18:00) - MD must write the "D/C heparin" order  Daily INR and heparin level  Given large INR jumps, pt will likely need to be sent home on a smaller warfarin dose (ie, 3 or 4 mg daily) with close INR follow up.  JVerdia Kuba PharmD Pager: [phone]/21/2013 7:12 AM

## 2011-10-13 ENCOUNTER — Other Ambulatory Visit: Payer: Self-pay | Admitting: Family Medicine

## 2011-10-15 LAB — STOOL CULTURE

## 2011-10-16 ENCOUNTER — Ambulatory Visit (INDEPENDENT_AMBULATORY_CARE_PROVIDER_SITE_OTHER): Payer: BC Managed Care – PPO | Admitting: Surgery

## 2011-10-16 ENCOUNTER — Encounter (INDEPENDENT_AMBULATORY_CARE_PROVIDER_SITE_OTHER): Payer: Self-pay | Admitting: Surgery

## 2011-10-16 VITALS — BP 134/72 | HR 72 | Temp 97.6°F | Resp 16 | Ht 62.0 in | Wt 193.0 lb

## 2011-10-16 DIAGNOSIS — I2699 Other pulmonary embolism without acute cor pulmonale: Secondary | ICD-10-CM

## 2011-10-16 DIAGNOSIS — E079 Disorder of thyroid, unspecified: Secondary | ICD-10-CM

## 2011-10-16 DIAGNOSIS — K432 Incisional hernia without obstruction or gangrene: Secondary | ICD-10-CM

## 2011-10-16 NOTE — Patient Instructions (Signed)
Thyroid Biopsy The thyroid gland is a butterfly-shaped gland situated in the front of the neck. It produces hormones which affect metabolism, growth and development, and body temperature. A thyroid biopsy is a procedure in which small samples of tissue or fluid are removed from the thyroid gland or mass and examined under a microscope. This test is done to determine the cause of thyroid problems, such as infection, cancer, or other thyroid problems. There are 2 ways to obtain samples: 1. Fine needle biopsy. Samples are removed using a thin needle inserted through the skin and into the thyroid gland or mass.  2. Open biopsy. Samples are removed after a cut (incision) is made through the skin.  LET YOUR CAREGIVER KNOW ABOUT:   Allergies.   Medications taken including herbs, eye drops, over-the-counter medications, and creams.   Use of steroids (by mouth or creams).   Previous problems with anesthetics or numbing medicine.   Possibility of pregnancy, if this applies.   History of blood clots (thrombophlebitis).   History of bleeding or blood problems.   Previous surgery.   Other health problems.  RISKS AND COMPLICATIONS  Bleeding from the site. The risk of bleeding is higher if you have a bleeding disorder or are taking any blood thinning medications (anticoagulants).   Infection.   Injury to structures near the thyroid gland.  BEFORE THE PROCEDURE  This is a procedure that can be done as an outpatient. Confirm the time that you need to arrive for your procedure. Confirm whether there is a need to fast or withhold any medications. A blood sample may be done to determine your blood clotting time. Medicine may be given to help you relax (sedative). PROCEDURE Fine needle biopsy. You will be awake during the procedure. You may be asked to lie on your back with your head tipped backward to extend your neck. Let your caregiver know if you cannot tolerate the positioning. An area on your  neck will be cleansed. A needle is inserted through the skin of your neck. You may feel a mild discomfort during this procedure. You may be asked to avoid coughing, talking, swallowing, or making sounds during some portions of the procedure. The needle is withdrawn once tissue or fluid samples have been removed. Pressure may be applied to the neck to reduce swelling and ensure that bleeding has stopped. The samples will be sent for examination.  Open biopsy. You will be given general anesthesia. You will be asleep during the procedure. An incision is made in your neck. A sample of thyroid tissue or the mass is removed. The tissue sample or mass will be sent for examination. The sample or mass may be examined during the biopsy. If the sample or mass contains cancer cells, some or all of the thyroid gland may be removed. The incision is closed with stitches. AFTER THE PROCEDURE  Your recovery will be assessed and monitored. If there are no problems, as an outpatient, you should be able to go home shortly after the procedure. If you had a fine needle biopsy:  You may have soreness at the biopsy site for 1 to 2 days.  If you had an open biopsy:   You may have soreness at the biopsy site for 3 to 4 days.   You may have a hoarse voice or sore throat for 1 to 2 days.  Obtaining the Test Results It is your responsibility to obtain your test results. Do not assume everything is normal if you have  not heard from your caregiver or the medical facility. It is important for you to follow up on all of your test results. HOME CARE INSTRUCTIONS   Keeping your head raised on a pillow when you are lying down may ease biopsy site discomfort.   Supporting the back of your head and neck with both hands as you sit up from a lying position may ease biopsy site discomfort.   Only take over-the-counter or prescription medicines for pain, discomfort, or fever as directed by your caregiver.   Throat lozenges or gargling  with warm salt water may help to soothe a sore throat.  SEEK IMMEDIATE MEDICAL CARE IF:   You have severe bleeding from the biopsy site.   You have difficulty swallowing.   You have a fever.   You have increased pain, swelling, redness, or warmth at the biopsy site.   You notice pus coming from the biopsy site.   You have swollen glands (lymph nodes) in your neck.  Document Released: 05/07/2007 Document Revised: 06/29/2011 Document Reviewed: 10/07/2008 [facility] Patient Information 2012 [place].

## 2011-10-16 NOTE — Progress Notes (Signed)
Subjective:     Patient ID: Alyssa [name], female   DOB: 1951-03-08, 61 y.o.   MRN: [id number]  HPI  Alyssa [name]  March 07, 1951 [id number]  Patient Care Team: Maxine Glenn. Nolon Rod as PCP - General (Family Medicine) Juanita Craver, MD as Consulting Physician (Gastroenterology)  This patient is a 61 y.o.female who presents today for surgical evaluation.   Procedure: Laparoscopic ventral hernia repair with mesh 09/29/2011  The patient comes in today feeling relatively well. She has not had much in way of abdominal pain. Not needed narcotics. Was getting up and mobilizing well.  However, she got severe shortness of breath. She went to emergency room. She was diagnosed with a pulmonary embolism. She was fully anticoagulated. On CAT scan she was found to have a mass on her neck as well. Left inferior lobe. Never had any problems in the past. No biopsies or treatments have been done since she has been fully anticoagulated for pulmonary embolism.  She is urinating well. Regular bowel movements. No bleeding. No fevers or chills. She wears a binder.  Patient Active Problem List  Diagnoses  . Obesity (BMI 30-39.9)  . Post concussion syndrome  . Acute pulmonary embolism  . Leukocytosis  . 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 Hyponatremia    Past Medical History  Diagnosis Date  . Hyperlipidemia   . Thyroid disease   . Abdominal pain   . Post concussion syndrome   . Ventral hernia   . Hypertension     PCP DR Nolon Rod  . Recurrent upper respiratory infection (URI)     FLU 08/23/11-08/29/11 then URI 08/29/11- 09/08/11- S/P zpack and steroids-  improved      no cough or congestion  . Hypothyroidism   . Post concussion syndrome     4 yrs ago- sees [facility]- Dr Leonie Man every 6 months-   has sleep study 2011 following accident but was neg per patient-  short term memory issues, dates and times  . Headache   . Hernia, incisional periumbilical, incarcerated 03/21/9370  . Thyroid mass,  left inferior lobe, 3.6cm [id number] 10/07/2011    Past Surgical History  Procedure Date  . Cholecystectomy 2006    lap chole, Dr. Bubba Camp  . Appendectomy 2002    lap appy.  Dr. Hassell Done  . Carpel tunnel     left in April 2011, right in June 2012  . Tarsal tunnel release     bilateral  . Cesarean section     x2  . Hernia repair 09/15/11    Ventral w/mesh    History   Social History  . Marital Status: Divorced    Spouse Name: N/A    Number of Children: N/A  . Years of Education: N/A   Occupational History  . Not on file.   Social History Main Topics  . Smoking status: Never Smoker   . Smokeless tobacco: Never Used  . Alcohol Use: 0.6 oz/week    1 Glasses of wine per week     occasional  . Drug Use: No  . Sexually Active: Yes   Other Topics Concern  . Not on file   Social History Narrative  . No narrative on file    Family History  Problem Relation Age of Onset  . Malignant hyperthermia Mother   . Heart attack Father     Current outpatient prescriptions:cetirizine (ZYRTEC) 10 MG tablet, Take 10 mg by mouth every evening. , Disp: , Rfl: ;  divalproex (DEPAKOTE)  500 MG DR tablet, Take 500 mg by mouth at bedtime. , Disp: , Rfl: ;  hydrochlorothiazide (HYDRODIURIL) 25 MG tablet, Take 25 mg by mouth at bedtime. , Disp: , Rfl: ;  rosuvastatin (CRESTOR) 10 MG tablet, Take 10 mg by mouth at bedtime. , Disp: , Rfl:  traZODone (DESYREL) 50 MG tablet, Take 50 mg by mouth at bedtime., Disp: , Rfl: ;  warfarin (COUMADIN) 3 MG tablet, Take 1 tablet (3 mg total) by mouth one time only at 6 PM., Disp: 30 tablet, Rfl: 0  Allergies  Allergen Reactions  . Codeine Other (See Comments)    hallucinations  . Penicillins Hives, Swelling and Rash    All over the body.  . Percocet (Oxycodone-Acetaminophen) Other (See Comments)    Causes syncope day after medication has been taken.  . Vicodin (Hydrocodone-Acetaminophen) Other (See Comments)    Causes syncope day after medication has been  taken.    BP 134/72  Pulse 72  Temp(Src) 97.6 F (36.4 C) (Temporal)  Resp 16  Ht 5' 2"  (1.575 m)  Wt 193 lb (87.544 kg)  BMI 35.30 kg/m2     Review of Systems  Constitutional: Negative for fever, chills and diaphoresis.  HENT: Negative for ear pain, sore throat and trouble swallowing.   Eyes: Negative for photophobia and visual disturbance.  Respiratory: Negative for cough and choking.   Cardiovascular: Negative for chest pain and palpitations.  Gastrointestinal: Negative for nausea, vomiting, abdominal pain, diarrhea, constipation, anal bleeding and rectal pain.  Genitourinary: Negative for dysuria, frequency and difficulty urinating.  Musculoskeletal: Negative for myalgias and gait problem.  Skin: Negative for color change, pallor and rash.  Neurological: Negative for dizziness, speech difficulty, weakness and numbness.  Hematological: Negative for adenopathy.  Psychiatric/Behavioral: Negative for confusion and agitation. The patient is not nervous/anxious.        Objective:   Physical Exam  Constitutional: She is oriented to person, place, and time. She appears well-developed and well-nourished. No distress.  HENT:  Head: Normocephalic.  Mouth/Throat: Oropharynx is clear and moist. No oropharyngeal exudate.  Eyes: Conjunctivae and EOM are normal. Pupils are equal, round, and reactive to light. No scleral icterus.  Neck: Trachea normal and normal range of motion. No JVD present. No spinous process tenderness and no muscular tenderness present. Carotid bruit is not present. No rigidity. No tracheal deviation, no edema and normal range of motion present. Mass present.    Cardiovascular: Normal rate and intact distal pulses.   Pulmonary/Chest: Effort normal. No respiratory distress. She exhibits no tenderness.  Abdominal: Soft. She exhibits no distension. There is no tenderness. Hernia confirmed negative in the right inguinal area and confirmed negative in the left inguinal  area.       Incisions clean with normal healing ridges.  No hernias  Genitourinary: No vaginal discharge found.  Musculoskeletal: Normal range of motion. She exhibits no tenderness.  Lymphadenopathy:       Right: No inguinal adenopathy present.       Left: No inguinal adenopathy present.  Neurological: She is alert and oriented to person, place, and time. No cranial nerve deficit. She exhibits normal muscle tone. Coordination normal.  Skin: Skin is warm and dry. No rash noted. She is not diaphoretic.  Psychiatric: She has a normal mood and affect. Her behavior is normal.   Ct Angio Chest W/cm &/or Wo Cm  10/07/2011  *RADIOLOGY REPORT*  Clinical Data: Shortness of breath with deep inspiration; epigastric and left upper quadrant abdominal pain.  History of abdominal hernia repair 8 days ago.  CT ANGIOGRAPHY CHEST  Technique:  Multidetector CT imaging of the chest using the standard protocol during bolus administration of intravenous contrast. Multiplanar reconstructed images including MIPs were obtained and reviewed to evaluate the vascular anatomy.  Contrast: 151m OMNIPAQUE IOHEXOL 300 MG/ML IJ SOLN  Comparison: Chest radiograph performed 09/12/2011  Findings: There is a large pulmonary embolus at the bifurcation of the pulmonary artery to the right middle and lower lung lobes, extending distally.  Additional segmental and subsegmental pulmonary emboli are noted to all lobes of both lungs.  Small bilateral pleural effusions are noted; pulmonary infarcts are seen at both lower lobes, more prominent on the right, with associated atelectasis.  There is no evidence of pneumothorax.  No masses are identified; no abnormal focal contrast enhancement is seen.  The mediastinum is unremarkable in appearance.  No mediastinal lymphadenopathy is seen.  No pericardial effusion is identified. The great vessels are unremarkable in appearance.  No axillary lymphadenopathy is seen.  There is a heterogeneous 3.6 cm mass  arising at the inferior aspect of the left thyroid lobe; this raises concern for malignancy. Thyroid ultrasound would be helpful for further evaluation; would correlate with thyroid labs, and evaluate further as deemed clinically appropriate.  The visualized portions of the liver and spleen are unremarkable.  No acute osseous abnormalities are seen.  IMPRESSION:  1.  Large pulmonary embolus at the bifurcation of the pulmonary artery to the right middle and lower lung lobes, extending distally; additional segmental and subsegmental pulmonary emboli noted to all lobes of both lungs. 2.  Small bilateral pleural effusions, with pulmonary infarcts at the lower lobes, more prominent on the right, and associated atelectasis. 3.  Heterogeneous 3.6 cm mass arising at the inferior aspect of the left thyroid lobe; this raises concern for malignancy.  Thyroid ultrasound would be helpful for further evaluation; would correlate with thyroid labs, and evaluate further as deemed clinically appropriate.  Critical Value/emergent results were called by telephone at the time of interpretation on 10/07/2011  at 05:54 a.m.  to  Dr. MChauncy Passy who verbally acknowledged these results.  Original Report Authenticated By: JSanta Lighter M.D.   Dg Abd Acute W/chest  10/07/2011  *RADIOLOGY REPORT*  Clinical Data: Abdominal pain and mid chest pain; difficulty with deep inspiration.  Recent hernia surgery.  ACUTE ABDOMEN SERIES (ABDOMEN 2 VIEW & CHEST 1 VIEW)  Comparison: Chest radiograph performed 09/12/2011, and CT of the abdomen and pelvis performed 12/22/2009  Findings: The lungs are well-aerated.  Vascular congestion is noted; peribronchial thickening is seen.  Mild left basilar atelectasis is noted.  There is no evidence of pleural effusion or pneumothorax.  The cardiomediastinal silhouette is within normal limits.  The visualized bowel gas pattern is unremarkable.  Scattered stool and air are seen within the colon; there is no  evidence of small bowel dilatation to suggest obstruction.  No free intra-abdominal air is identified on the provided upright view.  Clips are noted within the right upper quadrant, reflecting prior cholecystectomy.  No acute osseous abnormalities are seen; the sacroiliac joints are unremarkable in appearance.  IMPRESSION:  1.  Unremarkable bowel gas pattern; no free intra-abdominal air seen. 2.  Vascular congestion and peribronchial thickening noted.  Mild left basilar atelectasis seen.  Original Report Authenticated By: JSanta Lighter M.D.     Assessment:     Numerous issues:     Plan:     1. Ventral hernia repair. She's recovered remarkably  well from that. Off all pain medicines. Continue to use a binder when necessary. Continue to ambulate more as tolerated. Okay to bathe. I removed all her dressings. Incisions looked good. She can followup when necessary on abdominal hernia issues  Increase activity as tolerated.  Do not push through pain.  Advanced on diet as tolerated. Bowel regimen to avoid problems.  Return to clinic p.r.n. For the hernia issues.  The patient expressed understanding and appreciation  2.  Pulmonary embolism. I will defer to her primary care physician on management of that. This is a one-time event. Not a smoker. Not on anticoagulation. Odd that she would have this with a lap and ventral hernias and she mobilized rather early and did get DVT prophylaxis. For need for hypercoagulable workup to her primary care physician or possible hematology consult. Next her  3.  New thyroid nodule. I think this requires biopsy. However, I think her pulmonary embolism treatment trumps this for now.  At the very least needs to be followed. Anything larger than 2 cm probably should be removed.  A core biopsy would be helpful. She wants to discuss it with her primary care physician. I stressed to her that it should not be ignored.

## 2011-10-18 ENCOUNTER — Telehealth (INDEPENDENT_AMBULATORY_CARE_PROVIDER_SITE_OTHER): Payer: Self-pay | Admitting: Surgery

## 2011-10-18 ENCOUNTER — Encounter (HOSPITAL_COMMUNITY): Payer: Self-pay | Admitting: Surgery

## 2011-10-19 ENCOUNTER — Encounter (INDEPENDENT_AMBULATORY_CARE_PROVIDER_SITE_OTHER): Payer: Self-pay

## 2011-11-01 ENCOUNTER — Other Ambulatory Visit: Payer: Self-pay | Admitting: Family Medicine

## 2011-11-01 DIAGNOSIS — E079 Disorder of thyroid, unspecified: Secondary | ICD-10-CM

## 2011-11-02 ENCOUNTER — Ambulatory Visit
Admission: RE | Admit: 2011-11-02 | Discharge: 2011-11-02 | Disposition: A | Discharge status: Home / Self Care | Payer: BC Managed Care – PPO | Source: Ambulatory Visit | Attending: Family Medicine | Admitting: Family Medicine

## 2011-11-02 DIAGNOSIS — E079 Disorder of thyroid, unspecified: Secondary | ICD-10-CM

## 2011-11-03 ENCOUNTER — Other Ambulatory Visit: Payer: BC Managed Care – PPO

## 2011-11-08 ENCOUNTER — Other Ambulatory Visit: Payer: Self-pay | Admitting: Family Medicine

## 2011-11-08 DIAGNOSIS — E079 Disorder of thyroid, unspecified: Secondary | ICD-10-CM

## 2011-11-21 ENCOUNTER — Ambulatory Visit
Admission: RE | Admit: 2011-11-21 | Discharge: 2011-11-21 | Disposition: A | Discharge status: Home / Self Care | Payer: BC Managed Care – PPO | Source: Ambulatory Visit | Attending: Family Medicine | Admitting: Family Medicine

## 2011-11-21 ENCOUNTER — Other Ambulatory Visit (HOSPITAL_COMMUNITY)
Admission: RE | Admit: 2011-11-21 | Discharge: 2011-11-21 | Disposition: A | Discharge status: Home / Self Care | Payer: BC Managed Care – PPO | Source: Ambulatory Visit | Attending: Interventional Radiology | Admitting: Interventional Radiology

## 2011-11-21 DIAGNOSIS — E049 Nontoxic goiter, unspecified: Secondary | ICD-10-CM | POA: Insufficient documentation

## 2011-11-21 DIAGNOSIS — E079 Disorder of thyroid, unspecified: Secondary | ICD-10-CM

## 2011-11-23 ENCOUNTER — Ambulatory Visit (HOSPITAL_COMMUNITY)
Admission: RE | Admit: 2011-11-23 | Discharge: 2011-11-23 | Disposition: A | Discharge status: Home / Self Care | Payer: BC Managed Care – PPO | Source: Ambulatory Visit | Attending: Family Medicine | Admitting: Family Medicine

## 2011-11-23 DIAGNOSIS — Z86711 Personal history of pulmonary embolism: Secondary | ICD-10-CM | POA: Insufficient documentation

## 2011-11-23 DIAGNOSIS — M79609 Pain in unspecified limb: Secondary | ICD-10-CM | POA: Insufficient documentation

## 2011-11-23 DIAGNOSIS — M79606 Pain in leg, unspecified: Secondary | ICD-10-CM

## 2011-11-23 DIAGNOSIS — R609 Edema, unspecified: Secondary | ICD-10-CM

## 2011-11-23 DIAGNOSIS — Z7901 Long term (current) use of anticoagulants: Secondary | ICD-10-CM | POA: Insufficient documentation

## 2011-11-23 NOTE — Progress Notes (Signed)
VASCULAR LAB PRELIMINARY  PRELIMINARY  PRELIMINARY  PRELIMINARY  Bilateral lower extremity venous duplex  completed.    Preliminary report:  Bilateral:  No evidence of DVT, superficial thrombosis, or Baker's Cyst.   Judithann Sauger, RVT 11/23/2011, 9:08 PM

## 2012-09-30 ENCOUNTER — Other Ambulatory Visit: Payer: Self-pay | Admitting: Endocrinology

## 2012-09-30 DIAGNOSIS — E041 Nontoxic single thyroid nodule: Secondary | ICD-10-CM

## 2012-10-08 ENCOUNTER — Ambulatory Visit
Admission: RE | Admit: 2012-10-08 | Discharge: 2012-10-08 | Disposition: A | Discharge status: Home / Self Care | Payer: BC Managed Care – PPO | Source: Ambulatory Visit | Attending: Endocrinology | Admitting: Endocrinology

## 2012-10-08 DIAGNOSIS — E041 Nontoxic single thyroid nodule: Secondary | ICD-10-CM

## 2013-05-14 ENCOUNTER — Other Ambulatory Visit: Payer: Self-pay | Admitting: Family Medicine

## 2013-05-14 DIAGNOSIS — Z1231 Encounter for screening mammogram for malignant neoplasm of breast: Secondary | ICD-10-CM

## 2013-06-10 ENCOUNTER — Ambulatory Visit: Payer: BC Managed Care – PPO

## 2013-06-23 DIAGNOSIS — C50919 Malignant neoplasm of unspecified site of unspecified female breast: Secondary | ICD-10-CM | POA: Insufficient documentation

## 2013-06-23 HISTORY — DX: Malignant neoplasm of unspecified site of unspecified female breast: C50.919

## 2013-07-01 ENCOUNTER — Ambulatory Visit
Admission: RE | Admit: 2013-07-01 | Discharge: 2013-07-01 | Disposition: A | Discharge status: Home / Self Care | Payer: BC Managed Care – PPO | Source: Ambulatory Visit | Attending: Family Medicine | Admitting: Family Medicine

## 2013-07-01 DIAGNOSIS — Z1231 Encounter for screening mammogram for malignant neoplasm of breast: Secondary | ICD-10-CM

## 2013-07-03 ENCOUNTER — Other Ambulatory Visit: Payer: Self-pay | Admitting: Family Medicine

## 2013-07-03 DIAGNOSIS — R928 Other abnormal and inconclusive findings on diagnostic imaging of breast: Secondary | ICD-10-CM

## 2013-07-14 ENCOUNTER — Encounter: Payer: Self-pay | Admitting: Nurse Practitioner

## 2013-07-14 ENCOUNTER — Encounter (INDEPENDENT_AMBULATORY_CARE_PROVIDER_SITE_OTHER): Payer: Self-pay

## 2013-07-14 ENCOUNTER — Ambulatory Visit (INDEPENDENT_AMBULATORY_CARE_PROVIDER_SITE_OTHER): Payer: BC Managed Care – PPO | Admitting: Nurse Practitioner

## 2013-07-14 VITALS — BP 145/88 | HR 75 | Ht 62.5 in | Wt 218.5 lb

## 2013-07-14 DIAGNOSIS — R5383 Other fatigue: Secondary | ICD-10-CM

## 2013-07-14 DIAGNOSIS — R51 Headache: Secondary | ICD-10-CM

## 2013-07-14 DIAGNOSIS — R5381 Other malaise: Secondary | ICD-10-CM

## 2013-07-14 DIAGNOSIS — R413 Other amnesia: Secondary | ICD-10-CM | POA: Insufficient documentation

## 2013-07-14 DIAGNOSIS — R519 Headache, unspecified: Secondary | ICD-10-CM | POA: Insufficient documentation

## 2013-07-14 MED ORDER — DIVALPROEX SODIUM 500 MG PO DR TAB: 500.0000 mg | DELAYED_RELEASE_TABLET | Freq: Every day | ORAL | Status: DC

## 2013-07-14 NOTE — Patient Instructions (Signed)
Memory score is stable.  May taper Depakote but last time it was tapered headaches returned.  Will refill F/U yearly

## 2013-07-14 NOTE — Progress Notes (Signed)
[facility]  PATIENT: Alyssa [name] DOB: 1951-04-06   REASON FOR VISIT: Followup for cognitive changes post head injury   HISTORY OF PRESENT ILLNESS: Alyssa [name], 62 year old female returns for followup. She was last seen in the office 07/18/2012. Her headaches are in good control with Depakote. Her memory is stable per MMSE. 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 She has an additional job working at [employer]. She has not had any issues performing either of her jobs. No new neurologic complaints  HISTORY: of cognitive changes post  head injury. She was initially evaluated by Dr. Leonie Man in September of 2009. She was operating a push mower when she was going down a ditch in front of her home and  toppled over the handle and hit her head on the motor of the mower.  At that time she was not sure she loss consciousness or not,she was able to get up go indoors. She did not seek medical help that day, however she did see her primary care the next day when she was complaining of severe headache, appeared to be confused and disoriented and did not recognize the Dr. CT of the head showed no acute abnormality. She has since had an MRI of the brain which was read as normal as well as an EEG that was normal.  She returns today continuing to complain with short-term memory, attention, and  concentration difficulties but she continues to work and she has not had any difficulty performing her job duties.  She says she manages by using sticky notes, so that she will not forget things.  She denies any depression ,she is trying to exercise and has lost weight since last seen. She does complain of early morning headaches and fatigue, does not feel rested after sleeping although that is better with Trazodone.  She is currently on Depakote 500 daily 2 daily for headaches.Her headaches have returned since she is back to teaching after being off for the summer. She says  these are stress related. She does not want to increase medication.    There is no nausea, photophobia, with the headaches. 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  Fell several weeks ago and hit the back of the head. No obvious injury.  See ROS  07/18/12: Returns for followup. MMSE stable. Went off Depakote for a few weeks and headaches returned and mood was terrible. Her PCP gave her a RX to get back on the Depakote. Headaches are stable at present. Continues to work with out problems with performing job. Had hernia repair in March and complication of PE after surgery. Has just gotten off coumadin. No new complaints  REVIEW OF SYSTEMS: Full 14 system review of systems performed and notable only for those listed, all others are neg:  Constitution fatigue Cardiovascular: N/A  Ear/Nose/Throat: N/A  Skin: N/A  Eyes: N/A  Respiratory: N/A  Gastroitestinal: N/A  Hematology/Lymphatic: N/A  Endocrine: N/A Musculoskeletal: Joint swelling  Allergy/Immunology: N/A  Neurological: Memory loss Psychiatric: Decreased concentration  ALLERGIES: Allergies  Allergen Reactions  . Codeine Other (See Comments)    hallucinations  . Penicillins Hives, Swelling and Rash    All over the body.  Marland Kitchen Percocet [Oxycodone-Acetaminophen] Other (See Comments)    Causes syncope day after medication has been taken.  . Vicodin [Hydrocodone-Acetaminophen] Other (See Comments)    Causes syncope day after medication has been taken.    HOME MEDICATIONS: Outpatient  Prescriptions Prior to Visit  Medication Sig Dispense Refill  . cetirizine (ZYRTEC) 10 MG tablet Take 10 mg by mouth every evening.       . divalproex (DEPAKOTE) 500 MG DR tablet Take 500 mg by mouth at bedtime.       . hydrochlorothiazide (HYDRODIURIL) 25 MG tablet Take 25 mg by mouth at bedtime.       . traZODone (DESYREL) 50 MG tablet Take 50 mg by mouth at bedtime.      . rosuvastatin (CRESTOR) 10 MG tablet Take 10 mg by mouth at bedtime.         No facility-administered medications prior to visit.    PAST MEDICAL HISTORY: Past Medical History  Diagnosis Date  . Hyperlipidemia   . Thyroid disease   . Abdominal pain   . Post concussion syndrome   . Ventral hernia   . Hypertension     PCP DR Nolon Rod  . Recurrent upper respiratory infection (URI)     FLU 08/23/11-08/29/11 then URI 08/29/11- 09/08/11- S/P zpack and steroids-  improved      no cough or congestion  . Hypothyroidism   . Post concussion syndrome     4 yrs ago- sees [facility]- Dr Leonie Man every 6 months-   has sleep study 2011 following accident but was neg per patient-  short term memory issues, dates and times  . Headache(784.0)   . Hernia, incisional periumbilical, incarcerated 4/70/9628  . Thyroid mass, left inferior lobe, 3.6cm [id number] 10/07/2011  . Pulmonary embolism     PAST SURGICAL HISTORY: Past Surgical History  Procedure Laterality Date  . Cholecystectomy  2006    lap chole, Dr. Bubba Camp  . Appendectomy  2002    lap appy.  Dr. Hassell Done  . Carpel tunnel      left in April 2011, right in June 2012  . Tarsal tunnel release      bilateral  . Cesarean section      x2  . Hernia repair  09/15/11    Ventral w/mesh  . Ventral hernia repair  09/29/2011    Procedure: LAPAROSCOPIC VENTRAL HERNIA;  Surgeon: Adin Hector, MD;  Location: [facility];  Service: General;  Laterality: N/A;  Laparoscopic Ventral Wall Hernia Repair with Mesh    FAMILY HISTORY: Family History  Problem Relation Age of Onset  . Malignant hyperthermia Mother   . Heart attack Father   . Diabetes      Grandmother  . Other      respiratory- Grandmother    SOCIAL HISTORY: History   Social History  . Marital Status: Divorced    Spouse Name: N/A    Number of Children: 2  . Years of Education: Master's   Occupational History  .     Social History Main Topics  . Smoking status: Never Smoker   . Smokeless tobacco: Never Used  . Alcohol Use: No  . Drug Use: No  . Sexual  Activity: Yes   Other Topics Concern  . Not on file   Social History Narrative   Patient is divorced and lives alone.   Patient has two children.   Patient is a Pharmacist, hospital in [employer].   Patient has a Master's degree.   Patient drinks 3-4 cups of caffeine daily.    Patient is right handed.     PHYSICAL EXAM  Filed Vitals:   07/14/13 1410  BP: 145/88  Pulse: 75  Height: 5' 2.5" (1.588 m)  Weight: 218  lb 8 oz (99.111 kg)   Body mass index is 39.3 kg/(m^2).  Generalized: Well developed, in no acute distress  Head: normocephalic and atraumatic,. Oropharynx benign  Neck: Supple, no carotid bruits  Cardiac: Regular rate rhythm, no murmur  Musculoskeletal: No deformity   Neurological examination   Mentation: Alert oriented to time, place, history taking. MMSE 27/30. AFT 18. Follows all commands speech and language fluent  Cranial nerve II-XII: Pupils were equal round reactive to light extraocular movements were full, visual field were full on confrontational test. Facial sensation and strength were normal. hearing was intact to finger rubbing bilaterally. Uvula tongue midline. head turning and shoulder shrug were normal and symmetric.Tongue protrusion into cheek strength was normal. Motor: normal bulk and tone, full strength in the BUE, BLE, fine finger movements normal, no pronator drift. No focal weakness Sensory: normal and symmetric to light touch, pinprick, and  vibration  Coordination: finger-nose-finger, heel-to-shin bilaterally, no dysmetria Reflexes: 1+ upper lower and symmetric Gait and Station: Rising up from seated position without assistance, normal stance,  moderate stride, good arm swing, smooth turning, able to perform tiptoe, and heel walking without difficulty. Tandem gait is steady  DIAGNOSTIC DATA (LABS, IMAGING, TESTING) None to review  ASSESSMENT AND PLAN  62 y.o. year old female  has a past medical history of cognitive difficulties  following a closed head injury, likely post concussion syndrome.  Memory score is stable.  May taper Depakote but last time it was tapered headaches returned.  Will refill F/U yearly Dennie Bible, [facility], [facility], APRN  [facility] [address], [place], [place] [address] [phone]

## 2013-07-21 ENCOUNTER — Ambulatory Visit
Admission: RE | Admit: 2013-07-21 | Discharge: 2013-07-21 | Disposition: A | Discharge status: Home / Self Care | Payer: BC Managed Care – PPO | Source: Ambulatory Visit | Attending: Family Medicine | Admitting: Family Medicine

## 2013-07-21 ENCOUNTER — Other Ambulatory Visit: Payer: Self-pay | Admitting: Family Medicine

## 2013-07-21 DIAGNOSIS — R928 Other abnormal and inconclusive findings on diagnostic imaging of breast: Secondary | ICD-10-CM

## 2013-07-23 ENCOUNTER — Telehealth: Payer: Self-pay | Admitting: *Deleted

## 2013-07-23 DIAGNOSIS — Z170 Estrogen receptor positive status [ER+]: Secondary | ICD-10-CM | POA: Insufficient documentation

## 2013-07-23 DIAGNOSIS — C50212 Malignant neoplasm of upper-inner quadrant of left female breast: Secondary | ICD-10-CM

## 2013-07-23 NOTE — Telephone Encounter (Signed)
Confirmed [facility] for 07/30/13 at 0800.  Instructions and contact information given.

## 2013-07-24 HISTORY — PX: BREAST LUMPECTOMY: SHX2

## 2013-07-30 ENCOUNTER — Other Ambulatory Visit: Payer: BC Managed Care – PPO

## 2013-07-30 ENCOUNTER — Encounter: Payer: Self-pay | Admitting: *Deleted

## 2013-07-30 ENCOUNTER — Ambulatory Visit
Admission: RE | Admit: 2013-07-30 | Discharge: 2013-07-30 | Disposition: A | Discharge status: Home / Self Care | Payer: BC Managed Care – PPO | Source: Ambulatory Visit | Attending: Radiation Oncology | Admitting: Radiation Oncology

## 2013-07-30 ENCOUNTER — Ambulatory Visit (HOSPITAL_BASED_OUTPATIENT_CLINIC_OR_DEPARTMENT_OTHER): Payer: BC Managed Care – PPO | Admitting: General Surgery

## 2013-07-30 ENCOUNTER — Encounter: Payer: Self-pay | Admitting: Oncology

## 2013-07-30 ENCOUNTER — Encounter (HOSPITAL_COMMUNITY): Payer: Self-pay

## 2013-07-30 ENCOUNTER — Ambulatory Visit: Payer: BC Managed Care – PPO | Attending: General Surgery | Admitting: Physical Therapy

## 2013-07-30 ENCOUNTER — Telehealth: Payer: Self-pay | Admitting: *Deleted

## 2013-07-30 ENCOUNTER — Ambulatory Visit: Payer: BC Managed Care – PPO

## 2013-07-30 ENCOUNTER — Other Ambulatory Visit (HOSPITAL_BASED_OUTPATIENT_CLINIC_OR_DEPARTMENT_OTHER): Payer: BC Managed Care – PPO

## 2013-07-30 ENCOUNTER — Encounter (INDEPENDENT_AMBULATORY_CARE_PROVIDER_SITE_OTHER): Payer: Self-pay | Admitting: General Surgery

## 2013-07-30 ENCOUNTER — Ambulatory Visit (HOSPITAL_BASED_OUTPATIENT_CLINIC_OR_DEPARTMENT_OTHER): Payer: BC Managed Care – PPO | Admitting: Oncology

## 2013-07-30 ENCOUNTER — Ambulatory Visit (HOSPITAL_BASED_OUTPATIENT_CLINIC_OR_DEPARTMENT_OTHER): Payer: BC Managed Care – PPO

## 2013-07-30 VITALS — BP 143/86 | HR 67 | Temp 97.9°F | Resp 18 | Ht 62.5 in | Wt 218.3 lb

## 2013-07-30 DIAGNOSIS — C50919 Malignant neoplasm of unspecified site of unspecified female breast: Secondary | ICD-10-CM | POA: Insufficient documentation

## 2013-07-30 DIAGNOSIS — C50219 Malignant neoplasm of upper-inner quadrant of unspecified female breast: Secondary | ICD-10-CM

## 2013-07-30 DIAGNOSIS — IMO0001 Reserved for inherently not codable concepts without codable children: Secondary | ICD-10-CM | POA: Insufficient documentation

## 2013-07-30 DIAGNOSIS — C50212 Malignant neoplasm of upper-inner quadrant of left female breast: Secondary | ICD-10-CM

## 2013-07-30 DIAGNOSIS — R293 Abnormal posture: Secondary | ICD-10-CM | POA: Insufficient documentation

## 2013-07-30 DIAGNOSIS — E063 Autoimmune thyroiditis: Secondary | ICD-10-CM

## 2013-07-30 DIAGNOSIS — I10 Essential (primary) hypertension: Secondary | ICD-10-CM | POA: Insufficient documentation

## 2013-07-30 DIAGNOSIS — Z86711 Personal history of pulmonary embolism: Secondary | ICD-10-CM

## 2013-07-30 DIAGNOSIS — Z86718 Personal history of other venous thrombosis and embolism: Secondary | ICD-10-CM | POA: Insufficient documentation

## 2013-07-30 DIAGNOSIS — I2699 Other pulmonary embolism without acute cor pulmonale: Secondary | ICD-10-CM

## 2013-07-30 DIAGNOSIS — E079 Disorder of thyroid, unspecified: Secondary | ICD-10-CM

## 2013-07-30 DIAGNOSIS — D689 Coagulation defect, unspecified: Secondary | ICD-10-CM

## 2013-07-30 DIAGNOSIS — Z170 Estrogen receptor positive status [ER+]: Secondary | ICD-10-CM

## 2013-07-30 LAB — CBC WITH DIFFERENTIAL/PLATELET
BASO%: 1.2 % (ref 0.0–2.0)
Basophils Absolute: 0.1 10*3/uL (ref 0.0–0.1)
EOS ABS: 0.1 10*3/uL (ref 0.0–0.5)
EOS%: 3.3 % (ref 0.0–7.0)
HEMATOCRIT: 41.2 % (ref 34.8–46.6)
HGB: 13.7 g/dL (ref 11.6–15.9)
LYMPH#: 1.9 10*3/uL (ref 0.9–3.3)
LYMPH%: 44.4 % (ref 14.0–49.7)
MCH: 28.9 pg (ref 25.1–34.0)
MCHC: 33.3 g/dL (ref 31.5–36.0)
MCV: 87 fL (ref 79.5–101.0)
MONO#: 0.3 10*3/uL (ref 0.1–0.9)
MONO%: 8 % (ref 0.0–14.0)
NEUT%: 43.1 % (ref 38.4–76.8)
NEUTROS ABS: 1.9 10*3/uL (ref 1.5–6.5)
PLATELETS: 200 10*3/uL (ref 145–400)
RBC: 4.73 10*6/uL (ref 3.70–5.45)
RDW: 14.1 % (ref 11.2–14.5)
WBC: 4.3 10*3/uL (ref 3.9–10.3)

## 2013-07-30 LAB — COMPREHENSIVE METABOLIC PANEL (CC13)
ALBUMIN: 3.8 g/dL (ref 3.5–5.0)
ALT: 37 U/L (ref 0–55)
ANION GAP: 11 meq/L (ref 3–11)
AST: 23 U/L (ref 5–34)
Alkaline Phosphatase: 55 U/L (ref 40–150)
BILIRUBIN TOTAL: 0.62 mg/dL (ref 0.20–1.20)
BUN: 21.3 mg/dL (ref 7.0–26.0)
CALCIUM: 9.3 mg/dL (ref 8.4–10.4)
CHLORIDE: 105 meq/L (ref 98–109)
CO2: 28 meq/L (ref 22–29)
Creatinine: 0.9 mg/dL (ref 0.6–1.1)
GLUCOSE: 141 mg/dL — AB (ref 70–140)
POTASSIUM: 3.8 meq/L (ref 3.5–5.1)
SODIUM: 144 meq/L (ref 136–145)
TOTAL PROTEIN: 7 g/dL (ref 6.4–8.3)

## 2013-07-30 NOTE — Progress Notes (Signed)
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 female who is seen today at the [facility] for the courtesy of Dr. Donne Hazel for evaluation of her T1 N0 invasive ductal carcinoma of the left breast. At the time of her first mammogram ([facility]) on 07/01/2013 she was noted to have a possible mass within the left breast. Additional views and ultrasound showed a 1.1 x 1.1 x 0.9 cm mass at 11:00. This was biopsied and found to be diagnostic for invasive ductal carcinoma. Her tumor was ER/PR positive with a low proliferation index of 5%. She did not have a MRI scan.  PREVIOUS RADIATION THERAPY: No   PAST MEDICAL HISTORY:  has a past medical history of Hyperlipidemia; Thyroid disease; Abdominal pain; Post concussion syndrome; Ventral hernia; Hypertension; Recurrent upper respiratory infection (URI); Hypothyroidism; Post concussion syndrome; Headache(784.0); Hernia, incisional periumbilical, incarcerated (08/07/2011); Thyroid mass, left inferior lobe, 3.6cm [id number] (10/07/2011); Pulmonary embolism; and Hot flashes.     PAST SURGICAL HISTORY:  Past Surgical History  Procedure Laterality Date  . Cholecystectomy  2006    lap chole, Dr. Bubba Camp  . Appendectomy  2002    lap appy.  Dr. Hassell Done  . Carpel tunnel      left in April 2011, right in June 2012  . Tarsal tunnel release      bilateral  . Cesarean section      x2  . Hernia repair  09/15/11    Ventral w/mesh  . Ventral hernia repair  09/29/2011    Procedure: LAPAROSCOPIC VENTRAL HERNIA;  Surgeon: Adin Hector, MD;  Location: [facility];  Service: General;  Laterality: N/A;  Laparoscopic  Ventral Wall Hernia Repair with Mesh     FAMILY HISTORY: family history includes Diabetes in an other family member; Heart attack in her father; Malignant hyperthermia in her mother; Other in an other family member. Her father died of a stroke at age 11, postoperative complication. Her mother is alive and well at 62. A maternal grandmother was diagnosed with breast cancer and had a mastectomy at 54.   SOCIAL HISTORY:  reports that she has never smoked. She has never used smokeless tobacco. She reports that she does not drink alcohol or use illicit drugs. She works as a Estate agent. Divorced 2 children, currently engaged.   ALLERGIES: Codeine; Penicillins; Percocet; and Vicodin   MEDICATIONS:  Current Outpatient Prescriptions  Medication Sig Dispense Refill  . ALPRAZolam (XANAX) 0.5 MG tablet       . cetirizine (ZYRTEC) 10 MG tablet Take 10 mg by mouth every evening.       . divalproex (DEPAKOTE) 500 MG DR tablet Take 1 tablet (500 mg total) by mouth at bedtime.  30 tablet  11  . hydrochlorothiazide (HYDRODIURIL) 25 MG tablet Take 25 mg by mouth at bedtime.       Marland Kitchen thyroid (ARMOUR) 32.5 MG tablet Take 32.5 mg by mouth 2 (two) times daily.      . traZODone (DESYREL) 50 MG tablet Take 50 mg by mouth at bedtime.      Marland Kitchen  VITAMIN D, CHOLECALCIFEROL, PO Take 1 capsule by mouth. Vitamin D 15, 000 units with Vitamin K       No current facility-administered medications for this encounter.     REVIEW OF SYSTEMS:  Pertinent items are noted in HPI.    PHYSICAL EXAM: Alert and oriented 63 year old white female appearing younger than her stated age. Wt Readings from Last 3 Encounters:  07/30/13 218 lb 4.8 oz (99.02 kg)  07/14/13 218 lb 8 oz (99.111 kg)  10/16/11 193 lb (87.544 kg)   Temp Readings from Last 3 Encounters:  07/30/13 97.9 F (36.6 C) Oral  10/16/11 97.6 F (36.4 C) Temporal  10/12/11 98.6 F (37 C) Oral   BP Readings from Last 3 Encounters:  07/30/13  143/86  07/14/13 145/88  10/16/11 134/72   Pulse Readings from Last 3 Encounters:  07/30/13 67  07/14/13 75  10/16/11 72   Head and neck examination: Grossly unremarkable. Nodes: Without palpable cervical, supraclavicular, or axillary lymphadenopathy. Chest: Lungs clear. Breasts: There is a bruise at approximately 11:00 with slight thickening but no discrete mass within the left breast. Right breast without masses or lesions. Abdomen without hepatomegaly. Extremities: Without edema.    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 I explained to the patient and her friend that her local regional treatment options include a partial mastectomy followed by radiation therapy or mastectomy. She would need a sentinel lymph node biopsy as well. She desires breast preservation which I think would be a good option for her. We discussed the potential acute and late toxicities of radiation therapy, and she would be a candidate for hypo-fractionated treatment over 3-4 weeks. We also discussed deep inspiration breath-hold technology if needed to avoid the heart. Her prognosis appears to be favorable. I see that Dr. Jana Hakim will send off an Oncotype DX.  PLAN: As discussed above  I spent 40 minutes minutes face to face with the patient and more than 50% of that time was spent in counseling and/or coordination of care.

## 2013-07-30 NOTE — Telephone Encounter (Signed)
appts made and printed...td 

## 2013-07-30 NOTE — Progress Notes (Signed)
Patient ID: Alyssa [name], female   DOB: [birth year], 63 y.o.   MRN: [id number]  Chief Complaint  Patient presents with  . Other    HPI Alyssa [name] is a 63 y.o. female.  Referred by Dr Lynne Logan HPI 69 yof who underwent routine screening mammogram recently with left breast abnormality.  She has breast density category b.  Ultrasound shows a left breast mass measuring 1.1x0.9x1.1 cm in size.  Normal axillary contents visualized.  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  She comes today to discuss options. 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  She can only take hydrocodone not vicodin per her report. She had postop pe after lap ventral hernia repair last year also.  Finished taking coumadin a year ago.  Past Medical History  Diagnosis Date  . Hyperlipidemia   . Thyroid disease   . Abdominal pain   . Post concussion syndrome   . Ventral hernia   . Hypertension     PCP DR Nolon Rod  . Recurrent upper respiratory infection (URI)     FLU 08/23/11-08/29/11 then URI 08/29/11- 09/08/11- S/P zpack and steroids-  improved      no cough or congestion  . Hypothyroidism   . Post concussion syndrome     4 yrs ago- sees [facility]- Dr Leonie Man every 6 months-   has sleep study 2011 following accident but was neg per patient-  short term memory issues, dates and times  . Headache(784.0)   . Hernia, incisional periumbilical, incarcerated 10/14/4008  . Thyroid mass, left inferior lobe, 3.6cm [id number] 10/07/2011  . Pulmonary embolism   . Hot flashes     Past Surgical History  Procedure Laterality Date  . Cholecystectomy  2006    lap chole, Dr. Bubba Camp  . Appendectomy  2002    lap appy.  Dr. Hassell Done  . Carpel tunnel      left in April 2011, right in June 2012  . Tarsal tunnel release      bilateral  . Cesarean section      x2  . Hernia repair  09/15/11    Ventral w/mesh  . Ventral hernia repair  09/29/2011   Procedure: LAPAROSCOPIC VENTRAL HERNIA;  Surgeon: Adin Hector, MD;  Location: [facility];  Service: General;  Laterality: N/A;  Laparoscopic Ventral Wall Hernia Repair with Mesh    Family History  Problem Relation Age of Onset  . Malignant hyperthermia Mother   . Heart attack Father   . Diabetes      Grandmother  . Other      respiratory- Grandmother    Social History History  Substance Use Topics  . Smoking status: Never Smoker   . Smokeless tobacco: Never Used  . Alcohol Use: No    Allergies  Allergen Reactions  . Codeine Other (See Comments)    hallucinations  . Penicillins Hives, Swelling and Rash    All over the body.  Marland Kitchen Percocet [Oxycodone-Acetaminophen] Other (See Comments)    Causes syncope day after medication has been taken.  . Vicodin [Hydrocodone-Acetaminophen] Other (See Comments)    Causes syncope day after medication has been taken.    Current Outpatient Prescriptions  Medication Sig Dispense Refill  . ALPRAZolam (XANAX) 0.5 MG tablet       . cetirizine (ZYRTEC) 10 MG tablet Take 10 mg by mouth every evening.       . divalproex (DEPAKOTE)  500 MG DR tablet Take 1 tablet (500 mg total) by mouth at bedtime.  30 tablet  11  . hydrochlorothiazide (HYDRODIURIL) 25 MG tablet Take 25 mg by mouth at bedtime.       Marland Kitchen thyroid (ARMOUR) 32.5 MG tablet Take 32.5 mg by mouth 2 (two) times daily.      . traZODone (DESYREL) 50 MG tablet Take 50 mg by mouth at bedtime.      Marland Kitchen VITAMIN D, CHOLECALCIFEROL, PO Take 1 capsule by mouth. Vitamin D 15, 000 units with Vitamin K       No current facility-administered medications for this visit.    Review of Systems Review of Systems  Constitutional: Negative for fever, chills and unexpected weight change.  HENT: Negative for congestion, hearing loss, sore throat, trouble swallowing and voice change.   Eyes: Negative for visual disturbance.  Respiratory: Negative for cough and wheezing.   Cardiovascular: Negative for chest pain,  palpitations and leg swelling.  Gastrointestinal: Negative for nausea, vomiting, abdominal pain, diarrhea, constipation, blood in stool, abdominal distention and anal bleeding.  Genitourinary: Negative for hematuria, vaginal bleeding and difficulty urinating.  Musculoskeletal: Negative for arthralgias.  Skin: Negative for rash and wound.  Neurological: Negative for seizures, syncope and headaches.  Hematological: Negative for adenopathy. Does not bruise/bleed easily.  Psychiatric/Behavioral: Negative for confusion.    There were no vitals taken for this visit.  Physical Exam Physical Exam  Vitals reviewed. Constitutional: She appears well-developed and well-nourished.  Eyes: No scleral icterus.  Neck: Neck supple.  Cardiovascular: Normal rate, regular rhythm and normal heart sounds.   Pulmonary/Chest: Effort normal and breath sounds normal. She has no wheezes. She has no rales. Right breast exhibits no inverted nipple, no mass, no nipple discharge, no skin change and no tenderness. Left breast exhibits no inverted nipple, no mass, no nipple discharge, no skin change and no tenderness.  Abdominal: Soft.  Lymphadenopathy:    She has no cervical adenopathy.    She has no axillary adenopathy.       Right: No supraclavicular adenopathy present.       Left: No supraclavicular adenopathy present.    Data Reviewed EXAM:  DIGITAL DIAGNOSTIC left MAMMOGRAM  ULTRASOUND left BREAST  COMPARISON: None.  ACR Breast Density Category b: There are scattered areas of  fibroglandular density.  FINDINGS:  Spot compression views in the upper inner quadrant of the left  breast, posteriorly confirm the presence of an irregular high  density mass with ill-defined margins.  On physical exam I palpate thickening in the 11 o'clock position of  the left breast, 5 cm the nipple. No discrete mass is palpable.  Normal axillary contents are palpated on the left.  Ultrasound is performed, showing an  irregular hypoechoic mass with  ill-defined margins in the 11 o'clock position, 5 cm in the nipple  is measuring 1.1 x 0.9 x 1.1 cm. Normal axillary contents are  imaged.  IMPRESSION:  Mass, left breast for which a biopsy is done and reported  separately.    Assessment    Left breast cancer, clinical stage I    Plan    Left breast wire guided lumpectomy, left axillary sentinel node biopsy  We discussed the staging and pathophysiology of breast cancer. We discussed all of the different options for treatment for breast cancer including surgery, chemotherapy, radiation therapy, Herceptin, and antiestrogen therapy.   We discussed a sentinel lymph node biopsy as she does not appear to having lymph node involvement right  now. We discussed the performance of that with injection of radioactive tracer and possibly blue dye. We discussed that she would have an incision underneath her axillary hairline. We discussed that there is a chance of having a positive node with a sentinel lymph node biopsy and we will await the permanent pathology to make any other first further decisions in terms of her treatment. One of these options might be to return to the operating room to perform an axillary lymph node dissection. We discussed up to a 5% risk lifetime of chronic shoulder pain as well as lymphedema associated with a sentinel lymph node biopsy.  We discussed the options for treatment of the breast cancer which included lumpectomy versus a mastectomy. We discussed the performance of the lumpectomy with a wire placement. We discussed a 5-10% chance of a positive margin requiring reexcision in the operating room. We also discussed that she may need radiation therapy or antiestrogen therapy or both if she undergoes lumpectomy. We discussed the mastectomy and the postoperative care for that as well. We discussed that there is no difference in her survival whether she undergoes lumpectomy with radiation therapy or  antiestrogen therapy versus a mastectomy.  Due to prior pulmonary embolus associated with operation will use scds and give her preop heparin with early ambulation also. We discussed the risks of operation including bleeding, infection, possible reoperation. She understands her further therapy will be based on what her stages at the time of her operation.          Himmat Enberg 07/30/2013, 9:58 AM

## 2013-07-30 NOTE — Progress Notes (Signed)
Checked in new pt with no financial concerns. °

## 2013-07-30 NOTE — Progress Notes (Signed)
Faxed [employer] form to [employer] at [employer].

## 2013-07-30 NOTE — Progress Notes (Signed)
[place]  Telephone:(336) [phone] Fax:(336) [phone]     ID: Alyssa [name] OB: 1951-02-15  MR#: [id number]  POE#:[id number]  PCP: Alyssa Logan, MD GYN:   SU: Alyssa [name] OTHER MD: Alyssa [name], Alyssa [name]  CHIEF COMPLAINT: "I had my first mammogram ever and it showed cancer".  HISTORY OF PRESENT ILLNESS: Alyssa [name] had bilateral screening mammography at the breast Center 07/01/2013 and is suggested a possible mass in the left breast. Left diagnostic mammography and ultrasonography 07/21/2013 showed an irregular high-density mass in the upper inner quadrant of the left breast which was palpable at the 11:00 position 5 cm from the nipple. Ultrasound confirmed an irregular hypoechoic mass measuring 1.1 cm. The left axilla was unremarkable.  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  The patient's subsequent history is as detailed below.  INTERVAL HISTORY: Alyssa [name] was evaluated in the multidisciplinary breast cancer clinic 07/30/2013 accompanied by her friend Alyssa [name]  REVIEW OF SYSTEMS: There were no specific symptoms leading to the original mammogram, which was routinely scheduled. The patient denies unusual headaches, visual changes, nausea, vomiting, stiff neck, dizziness, or gait imbalance. There has been no cough, phlegm production, or pleurisy, no chest pain or pressure, and no change in bowel or bladder habits. The patient denies fever, rash, bleeding, unexplained fatigue or unexplained weight loss. She has occasional night sweats and hot flashes and describes herself as rarely fatigued. She does not exercise regularly. A detailed review of systems was otherwise entirely negative.  PAST MEDICAL HISTORY: Past Medical History    Diagnosis Date  . Hyperlipidemia   . Thyroid disease   . Abdominal pain   . Post concussion syndrome   . Ventral hernia   . Hypertension     PCP Alyssa Alyssa [name]  . Recurrent upper respiratory infection (URI)     FLU 08/23/11-08/29/11 then URI 08/29/11- 09/08/11- S/P zpack and steroids-  improved      no cough or congestion  . Hypothyroidism   . Post concussion syndrome     4 yrs ago- sees Alyssa [name]- Alyssa [name] every 6 months-   has sleep study 2011 following accident but was neg per patient-  short term memory issues, dates and times  . Headache(784.0)   . Hernia, incisional periumbilical, incarcerated 6/76/1950  . Thyroid mass, left inferior lobe, 3.6cm [id number] 10/07/2011  . Pulmonary embolism   . Hot flashes     PAST SURGICAL HISTORY: Past Surgical History  Procedure Laterality Date  . Cholecystectomy  2006    lap chole, Alyssa. Bubba [name]  . Appendectomy  2002    lap appy.  Alyssa. Hassell [name]  . Carpel tunnel      left in April 2011, right in June 2012  . Tarsal tunnel release      bilateral  . Cesarean section      x2  . Hernia repair  09/15/11    Ventral w/mesh  . Ventral hernia repair  09/29/2011    Procedure: LAPAROSCOPIC VENTRAL HERNIA;  Surgeon: Alyssa Hector, MD;  Location: [facility];  Service: General;  Laterality: N/A;  Laparoscopic Ventral Wall Hernia Repair with Mesh    FAMILY HISTORY Family History  Problem Relation Age of Onset  . Malignant hyperthermia Alyssa [name]   . Heart attack Father   . Diabetes  Alyssa [name]  . Other      respiratory- Alyssa [name]   patient's father died at the age of 51 from a myocardial infarction. The patient's Alyssa [name], Alyssa [name], is alive at 2. She lives with the patient, and is very independent (drives, etc.) The patient has 2 brothers, no sisters. The patient's Alyssa [name] is Alyssa [name] was diagnosed with breast cancer at the age of 16. There is no history of breast or ovarian cancer in the family (there is a history of cervical cancer in the patient's  Alyssa [name]).  GYNECOLOGIC HISTORY:  Menarche age 66, first live birth age 33, the patient is Alyssa [name] P2. She stopped having periods approximately 1999 and took hormone replacements until 2005.  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 She is divorced and lives with her Alyssa [name] Alyssa [name]. 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 Alyssa [name] Alyssa [name] lives in [place], also in business. The patient has 2 grandchildren. She att[employer]    ADVANCED DIRECTIVES: Not in place   HEALTH MAINTENANCE: History  Substance Use Topics  . Smoking status: Never Smoker   . Smokeless tobacco: Never Used  . Alcohol Use: No     Colonoscopy: Never  PAP:  Bone density: Never  Lipid panel:  Allergies  Allergen Reactions  . Codeine Other (See Comments)    hallucinations  . Penicillins Hives, Swelling and Rash    All over the body.  Marland Kitchen Percocet [Oxycodone-Acetaminophen] Other (See Comments)    Causes syncope day after medication has been taken.  . Vicodin [Hydrocodone-Acetaminophen] Other (See Comments)    Causes syncope day after medication has been taken.    Current Outpatient Prescriptions  Medication Sig Dispense Refill  . ALPRAZolam (XANAX) 0.5 MG tablet       . cetirizine (ZYRTEC) 10 MG tablet Take 10 mg by mouth every evening.       . divalproex (DEPAKOTE) 500 MG Alyssa tablet Take 1 tablet (500 mg total) by mouth at bedtime.  30 tablet  11  . hydrochlorothiazide (HYDRODIURIL) 25 MG tablet Take 25 mg by mouth at bedtime.       Marland Kitchen thyroid (ARMOUR) 32.5 MG tablet Take 32.5 mg by mouth 2 (two) times daily.      . traZODone (DESYREL) 50 MG tablet Take 50 mg by mouth at bedtime.      Marland Kitchen VITAMIN D, CHOLECALCIFEROL, PO Take 1 capsule by mouth. Vitamin D 15, 000 units with Vitamin K       No current facility-administered medications for this visit.    OBJECTIVE: Middle-aged white woman in no acute distress Filed Vitals:   07/30/13 0905  BP:  143/86  Pulse: 67  Temp: 97.9 F (36.6 C)  Resp: 18     Body mass index is 39.27 kg/(m^2).    ECOG FS:0 - Asymptomatic  Ocular: Sclerae unicteric, pupils equal, round and reactive to light Ear-nose-throat: Oropharynx clear, d no thrush or other lesions Lymphatic: No cervical or supraclavicular adenopathy Lungs no rales or rhonchi, good excursion bilaterally Heart regular rate and rhythm, no murmur appreciated Abd soft, obese, nontender, positive bowel sounds MSK no focal spinal tenderness, no joint edema [name]: non-focal, well-oriented, appropriate affect Breasts: The right breast is unremarkable. I do not palpate a well-defined mass in the left breast. There are no skin or nipple changes of concern. The left axilla is benign   LAB RESULTS:  CMP     Component Value Date/Time   NA 144 07/30/2013  0812   NA 144 10/12/2011 0514   K 3.8 07/30/2013 0812   K 3.3* 10/12/2011 0514   CL 104 10/12/2011 0514   CO2 28 07/30/2013 0812   CO2 30 10/12/2011 0514   GLUCOSE 141* 07/30/2013 0812   GLUCOSE 93 10/12/2011 0514   BUN 21.3 07/30/2013 0812   BUN 7 10/12/2011 0514   CREATININE 0.9 07/30/2013 0812   CREATININE 0.67 10/12/2011 0514   CALCIUM 9.3 07/30/2013 0812   CALCIUM 9.3 10/12/2011 0514   PROT 7.0 07/30/2013 0812   PROT 6.4 10/08/2011 0525   ALBUMIN 3.8 07/30/2013 0812   ALBUMIN 2.8* 10/08/2011 0525   AST 23 07/30/2013 0812   AST 119* 10/08/2011 0525   ALT 37 07/30/2013 0812   ALT 155* 10/08/2011 0525   ALKPHOS 55 07/30/2013 0812   ALKPHOS 151* 10/08/2011 0525   BILITOT 0.62 07/30/2013 0812   BILITOT 1.0 10/08/2011 0525   GFRNONAA >90 10/12/2011 0514   GFRAA >90 10/12/2011 0514    I No results found for this basename: SPEP, UPEP,  kappa and lambda light chains    Lab Results  Component Value Date   WBC 4.3 07/30/2013   NEUTROABS 1.9 07/30/2013   HGB 13.7 07/30/2013   HCT 41.2 07/30/2013   MCV 87.0 07/30/2013   PLT 200 07/30/2013      Chemistry      Component Value Date/Time   NA 144 07/30/2013 0812   NA 144  10/12/2011 0514   K 3.8 07/30/2013 0812   K 3.3* 10/12/2011 0514   CL 104 10/12/2011 0514   CO2 28 07/30/2013 0812   CO2 30 10/12/2011 0514   BUN 21.3 07/30/2013 0812   BUN 7 10/12/2011 0514   CREATININE 0.9 07/30/2013 0812   CREATININE 0.67 10/12/2011 0514      Component Value Date/Time   CALCIUM 9.3 07/30/2013 0812   CALCIUM 9.3 10/12/2011 0514   ALKPHOS 55 07/30/2013 0812   ALKPHOS 151* 10/08/2011 0525   AST 23 07/30/2013 0812   AST 119* 10/08/2011 0525   ALT 37 07/30/2013 0812   ALT 155* 10/08/2011 0525   BILITOT 0.62 07/30/2013 0812   BILITOT 1.0 10/08/2011 0525       No results found for this basename: LABCA2    No components found with this basename: [id number]    No results found for this basename: INR,  in the last 168 hours  Urinalysis    Component Value Date/Time   COLORURINE AMBER* 10/07/2011 0501   APPEARANCEUR CLOUDY* 10/07/2011 0501   LABSPEC 1.034* 10/07/2011 0501   PHURINE 5.5 10/07/2011 0501   GLUCOSEU NEGATIVE 10/07/2011 0501   HGBUR NEGATIVE 10/07/2011 0501   BILIRUBINUR SMALL* 10/07/2011 0501   KETONESUR TRACE* 10/07/2011 0501   PROTEINUR NEGATIVE 10/07/2011 0501   UROBILINOGEN 0.2 10/07/2011 0501   NITRITE NEGATIVE 10/07/2011 0501   LEUKOCYTESUR NEGATIVE 10/07/2011 0501    STUDIES: US Breast Left  07/21/2013   CLINICAL DATA:  Abnormal screening, left breast.  First mammogram.  EXAM: DIGITAL DIAGNOSTIC  left MAMMOGRAM  ULTRASOUND left BREAST  COMPARISON:  None.  ACR Breast Density Category b: There are scattered areas of fibroglandular density.  FINDINGS: Spot compression views in the upper inner quadrant of the left breast, posteriorly confirm the presence of an irregular high density mass with ill-defined margins.  On physical exam I palpate thickening in the 11 o'clock position of the left breast, 5 cm the nipple. No discrete mass is palpable. Normal axillary contents are palpated  on the left.  Ultrasound is performed, showing an irregular hypoechoic mass with ill-defined margins  in the 11 o'clock position, 5 cm in the nipple is measuring 1.1 x 0.9 x 1.1 cm. Normal axillary contents are imaged.  IMPRESSION: Mass, left breast for which a biopsy is [name] and reported separately.  RECOMMENDATION: Ultrasound-guided left breast biopsy.  I have discussed the findings and recommendations with the patient. Results were also provided in writing at the conclusion of the visit. If applicable, a reminder letter will be sent to the patient regarding the next appointment.  BI-RADS CATEGORY  4: Suspicious abnormality - biopsy should be considered.   Electronically Signed   By: Duke Salvia M.D.   On: 07/21/2013 10:04   Mm Digital Diagnostic Unilat L  07/21/2013   CLINICAL DATA:  Abnormal screening, left breast.  First mammogram.  EXAM: DIGITAL DIAGNOSTIC  left MAMMOGRAM  ULTRASOUND left BREAST  COMPARISON:  None.  ACR Breast Density Category b: There are scattered areas of fibroglandular density.  FINDINGS: Spot compression views in the upper inner quadrant of the left breast, posteriorly confirm the presence of an irregular high density mass with ill-defined margins.  On physical exam I palpate thickening in the 11 o'clock position of the left breast, 5 cm the nipple. No discrete mass is palpable. Normal axillary contents are palpated on the left.  Ultrasound is performed, showing an irregular hypoechoic mass with ill-defined margins in the 11 o'clock position, 5 cm in the nipple is measuring 1.1 x 0.9 x 1.1 cm. Normal axillary contents are imaged.  IMPRESSION: Mass, left breast for which a biopsy is [name] and reported separately.  RECOMMENDATION: Ultrasound-guided left breast biopsy.  I have discussed the findings and recommendations with the patient. Results were also provided in writing at the conclusion of the visit. If applicable, a reminder letter will be sent to the patient regarding the next appointment.  BI-RADS CATEGORY  4: Suspicious abnormality - biopsy should be considered.    Electronically Signed   By: Duke Salvia M.D.   On: 07/21/2013 10:04   Mm Digital Diagnostic Unilat L  07/21/2013   CLINICAL DATA:  Left breast mass  EXAM: DIAGNOSTIC left MAMMOGRAM POST ULTRASOUND BIOPSY  COMPARISON:  Previous exams  FINDINGS: Mammographic images were obtained following ultrasound guided biopsy of a left breast mass. Images show a ribbon shaped clip in association with the mass in the upper inner quadrant of the left breast.  IMPRESSION: Adequate clip placement following ultrasound-guided left breast biopsy.  Final Assessment: Post Procedure Mammograms for Marker Placement   Electronically Signed   By: Duke Salvia M.D.   On: 07/21/2013 10:10   Mm Digital Screening  07/02/2013   CLINICAL DATA:  Screening.  EXAM: DIGITAL SCREENING BILATERAL MAMMOGRAM WITH CAD  COMPARISON:  None  ACR Breast Density Category b: There are scattered areas of fibroglandular density.  FINDINGS: In the left breast, a possible mass warrants further evaluation with spot compression views and possibly ultrasound. In the right breast, no masses or malignant type calcifications are identified. Images were processed with CAD.  IMPRESSION: Further evaluation is suggested for possible mass in the left breast.  RECOMMENDATION: Diagnostic mammogram and possibly ultrasound of the left breast. (Code:[id number])  The patient will be contacted regarding the findings, and additional imaging will be scheduled.  BI-RADS CATEGORY  0: Incomplete. Need additional imaging evaluation and/or prior mammograms for comparison.   Electronically Signed   By: Luberta Robertson M.D.   On: 07/02/2013  13:01   [place] Lt Breast Bx W Loc Dev 1st Lesion Img Bx Spec US Guide  07/22/2013   ADDENDUM REPORT: 07/22/2013 12:09  ADDENDUM: PATHOLOGY demonstrates low grade invasive ductal carcinoma. This is concordant. Surgical consultation is recommended. The patient is scheduled for multidisciplinary clinic on 07/30/2013. The patient was notified of  results on 07/22/2013 at noon. She has no complaints regarding her biopsy site.   Electronically Signed   By: Donavan Burnet M.D.   On: 07/22/2013 12:09   07/22/2013   CLINICAL DATA:  Left breast mass.  EXAM: ULTRASOUND GUIDED left BREAST CORE NEEDLE BIOPSY  COMPARISON:  Previous exams.  FINDINGS: I met with the patient and we discussed the procedure of ultrasound-guided biopsy, including benefits and alternatives. We discussed the high likelihood of a successful procedure. We discussed the risks of the procedure, including infection, bleeding, tissue injury, clip migration, and inadequate sampling. Informed written consent was given. The usual time-out protocol was performed immediately prior to the procedure.  Using sterile technique and 2% Lidocaine as local anesthetic, under direct ultrasound visualization, a 14 gauge spring-loaded device was used to perform biopsy of the mass using a medial approach. At the conclusion of the procedure a ribbon tissue marker clip was deployed into the biopsy cavity. Follow up 2 view mammogram was performed and dictated separately.  IMPRESSION: Ultrasound guided biopsy of a left breast mass. No apparent complications.  Electronically Signed: By: Duke Salvia M.D. On: 07/21/2013 10:09    ASSESSMENT: 63 y.o. [place] woman status post left breast biopsy 07/21/2013 for a clinical T1 N0, stage IA invasive ductal carcinoma, grade 1, estrogen and progesterone receptor are both strongly positive, HER-2 not amplified, with an MIB-1 of 5%  (1) coagulopathy: History of pulmonary embolism documented by CT angiography 10/07/2011 (9 days after ventral hernia repair); Dopplers 11-2011 showed no lower extremity DVTs; the patient received Coumadin for 10 months. 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 Danyale understands her tumor is small, appears node-negative, is low grade, and has a low proliferation fraction. All this is very favorable, and suggests a luminal A subtype ductal breast cancer. The pattern on the prognostic panel supports this.  For this reason her benefit from chemotherapy is likely to be marginal. We are sending an Oncotype DX according to NCCN guidelines but my expectation is that she will not need chemotherapy and will be able to proceed directly to radiation once she recovers from her upcoming surgery.  On the other hand she will likely derive significant benefit from antiestrogen. These will be discussed once she completes her radiation treatments. She will have a bone density some time before that visit to help [place] with the initial choice of antiestrogen.  Alyssa. Donne Hazel is already implementing extra precautions perioperatively given the patient's prior history of pulmonary embolus. I am obtaining a hypercoagulable panel today and will discuss those results with Breelle when she returns to see me in early February.  The patient has a good understanding of the overall plan, and agrees with it. She knows a goal of treatment in her case is cure. She will call with any questions or problems before her next visit here.  Chauncey Cruel, MD   07/30/2013 11:34 AM

## 2013-07-30 NOTE — Telephone Encounter (Signed)
sw pt informed her that her appt for 09/05/13 are now scheduled for 09/03/13 w/ labs@4p  and ov@ 4:30pm. Pt is aware...td

## 2013-08-01 ENCOUNTER — Other Ambulatory Visit (HOSPITAL_COMMUNITY): Payer: Self-pay | Admitting: *Deleted

## 2013-08-01 ENCOUNTER — Encounter (HOSPITAL_COMMUNITY)
Admission: RE | Admit: 2013-08-01 | Discharge: 2013-08-01 | Disposition: A | Discharge status: Home / Self Care | Payer: BC Managed Care – PPO | Source: Ambulatory Visit | Attending: General Surgery | Admitting: General Surgery

## 2013-08-01 ENCOUNTER — Encounter (HOSPITAL_COMMUNITY): Payer: Self-pay

## 2013-08-01 HISTORY — DX: Malignant (primary) neoplasm, unspecified: C80.1

## 2013-08-01 HISTORY — DX: Peripheral vascular disease, unspecified: I73.9

## 2013-08-01 LAB — BASIC METABOLIC PANEL
BUN: 19 mg/dL (ref 6–23)
CO2: 31 mEq/L (ref 19–32)
CREATININE: 1 mg/dL (ref 0.50–1.10)
Calcium: 9.3 mg/dL (ref 8.4–10.5)
Chloride: 97 mEq/L (ref 96–112)
GFR calc non Af Amer: 60 mL/min — ABNORMAL LOW (ref >90)
GFR, EST AFRICAN AMERICAN: 69 mL/min — AB (ref >90)
Glucose, Bld: 103 mg/dL — ABNORMAL HIGH (ref 70–99)
POTASSIUM: 4.4 meq/L (ref 3.7–5.3)
Sodium: 142 mEq/L (ref 137–147)

## 2013-08-01 LAB — CBC
HEMATOCRIT: 40.7 % (ref 36.0–46.0)
HEMOGLOBIN: 13.8 g/dL (ref 12.0–15.0)
MCH: 29.4 pg (ref 26.0–34.0)
MCHC: 33.9 g/dL (ref 30.0–36.0)
MCV: 86.6 fL (ref 78.0–100.0)
Platelets: 236 10*3/uL (ref 150–400)
RBC: 4.7 MIL/uL (ref 3.87–5.11)
RDW: 13.4 % (ref 11.5–15.5)
WBC: 5.5 10*3/uL (ref 4.0–10.5)

## 2013-08-01 LAB — HYPERCOAGULABLE PANEL, COMPREHENSIVE
ANTICARDIOLIPIN IGG: 9 GPL U/mL (ref <23)
ANTICARDIOLIPIN IGM: 4 [MPL'U]/mL (ref <11)
AntiThromb III Func: 101 % (ref 76–126)
Anticardiolipin IgA: 10 APL U/mL (ref <22)
BETA 2 GLYCO I IGG: 6 G Units (ref <20)
Beta-2-Glycoprotein I IgA: 7 A Units (ref <20)
Beta-2-Glycoprotein I IgM: 25 M Units — ABNORMAL HIGH (ref <20)
DRVVT: 41.9 s (ref <42.9)
Lupus Anticoagulant: NOT DETECTED
PROTEIN C ACTIVITY: 200 % — AB (ref 75–133)
PROTEIN S ACTIVITY: 118 % (ref 69–129)
PTT Lupus Anticoagulant: 32.9 secs (ref 28.0–43.0)
Protein C, Total: 94 % (ref 72–160)
Protein S Total: 106 % (ref 60–150)

## 2013-08-01 LAB — D-DIMER, QUANTITATIVE (NOT AT ARMC): D-Dimer, Quant: 0.59 ug/mL-FEU — ABNORMAL HIGH (ref 0.00–0.48)

## 2013-08-01 NOTE — Pre-Procedure Instructions (Addendum)
Alyssa [name]  08/01/2013   Your procedure is scheduled on:  Monday, August 04, 2013 at 10:00 AM.   Report to [facility] Entrance "A" Admitting Office at 8:00 AM. Or as soon as you're done at the [facility].   Call this number if you have problems the morning of surgery: [phone]   Remember:   Do not eat food or drink liquids after midnight Sunday, 08/03/13.   Take these medicines the morning of surgery with A SIP OF WATER: thyroid (ARMOUR),xanax if needed     STOP all herbel meds, nsaids (aleve,naproxen,advil,ibuprofen) today   Do not wear jewelry, make-up or nail polish.  Do not wear lotions, powders, or perfumes. You may wear deodorant.  Do not shave 48 hours prior to surgery.   Do not bring valuables to the hospital.  [facility] is not responsible                  for any belongings or valuables.               Contacts, dentures or bridgework may not be worn into surgery.  Leave suitcase in the car. After surgery it may be brought to your room.  For patients admitted to the hospital, discharge time is determined by your                treatment team.              Special Instructions: Shower using CHG 2 nights before surgery and the night before surgery.  If you shower the day of surgery use CHG.  Use special wash - you have one bottle of CHG for all showers.  You should use approximately 1/3 of the bottle for each shower.   Please read over the following fact sheets that you were given: Pain Booklet, Coughing and Deep Breathing and Surgical Site Infection Prevention

## 2013-08-03 MED ORDER — CIPROFLOXACIN IN D5W 400 MG/200ML IV SOLN
400.0000 mg | INTRAVENOUS | Status: AC
  Administered 2013-08-04: 400 mg via INTRAVENOUS
  Filled 2013-08-03: qty 200

## 2013-08-03 MED ORDER — HEPARIN SODIUM (PORCINE) 5000 UNIT/ML IJ SOLN
5000.0000 [IU] | Freq: Once | INTRAMUSCULAR | Status: AC
  Administered 2013-08-04: 5000 [IU] via SUBCUTANEOUS

## 2013-08-04 ENCOUNTER — Encounter (HOSPITAL_COMMUNITY): Payer: Self-pay | Admitting: Certified Registered Nurse Anesthetist

## 2013-08-04 ENCOUNTER — Ambulatory Visit
Admission: RE | Admit: 2013-08-04 | Discharge: 2013-08-04 | Disposition: A | Discharge status: Home / Self Care | Payer: BC Managed Care – PPO | Source: Ambulatory Visit | Attending: General Surgery | Admitting: General Surgery

## 2013-08-04 ENCOUNTER — Ambulatory Visit (HOSPITAL_COMMUNITY): Payer: BC Managed Care – PPO | Admitting: Certified Registered Nurse Anesthetist

## 2013-08-04 ENCOUNTER — Ambulatory Visit (HOSPITAL_COMMUNITY)
Admission: RE | Admit: 2013-08-04 | Discharge: 2013-08-04 | Disposition: A | Discharge status: Home / Self Care | Payer: BC Managed Care – PPO | Source: Ambulatory Visit | Attending: General Surgery | Admitting: General Surgery

## 2013-08-04 ENCOUNTER — Encounter (HOSPITAL_COMMUNITY)
Admission: RE | Disposition: A | Discharge status: Home / Self Care | Payer: Self-pay | Source: Ambulatory Visit | Attending: General Surgery

## 2013-08-04 ENCOUNTER — Encounter (HOSPITAL_COMMUNITY)
Admission: RE | Admit: 2013-08-04 | Discharge: 2013-08-04 | Disposition: A | Discharge status: Home / Self Care | Payer: BC Managed Care – PPO | Source: Ambulatory Visit | Attending: General Surgery | Admitting: General Surgery

## 2013-08-04 ENCOUNTER — Encounter (HOSPITAL_COMMUNITY): Payer: BC Managed Care – PPO | Admitting: Certified Registered Nurse Anesthetist

## 2013-08-04 ENCOUNTER — Other Ambulatory Visit (INDEPENDENT_AMBULATORY_CARE_PROVIDER_SITE_OTHER): Payer: Self-pay | Admitting: General Surgery

## 2013-08-04 DIAGNOSIS — Z01818 Encounter for other preprocedural examination: Secondary | ICD-10-CM | POA: Insufficient documentation

## 2013-08-04 DIAGNOSIS — C50219 Malignant neoplasm of upper-inner quadrant of unspecified female breast: Secondary | ICD-10-CM | POA: Insufficient documentation

## 2013-08-04 DIAGNOSIS — C50212 Malignant neoplasm of upper-inner quadrant of left female breast: Secondary | ICD-10-CM

## 2013-08-04 DIAGNOSIS — C50919 Malignant neoplasm of unspecified site of unspecified female breast: Secondary | ICD-10-CM

## 2013-08-04 DIAGNOSIS — E039 Hypothyroidism, unspecified: Secondary | ICD-10-CM | POA: Insufficient documentation

## 2013-08-04 DIAGNOSIS — Z86711 Personal history of pulmonary embolism: Secondary | ICD-10-CM | POA: Insufficient documentation

## 2013-08-04 DIAGNOSIS — Z01812 Encounter for preprocedural laboratory examination: Secondary | ICD-10-CM | POA: Insufficient documentation

## 2013-08-04 DIAGNOSIS — D0590 Unspecified type of carcinoma in situ of unspecified breast: Secondary | ICD-10-CM | POA: Insufficient documentation

## 2013-08-04 DIAGNOSIS — Z01810 Encounter for preprocedural cardiovascular examination: Secondary | ICD-10-CM | POA: Insufficient documentation

## 2013-08-04 DIAGNOSIS — E785 Hyperlipidemia, unspecified: Secondary | ICD-10-CM | POA: Insufficient documentation

## 2013-08-04 DIAGNOSIS — Z170 Estrogen receptor positive status [ER+]: Secondary | ICD-10-CM | POA: Insufficient documentation

## 2013-08-04 DIAGNOSIS — I10 Essential (primary) hypertension: Secondary | ICD-10-CM | POA: Insufficient documentation

## 2013-08-04 HISTORY — PX: BREAST LUMPECTOMY WITH NEEDLE LOCALIZATION AND AXILLARY SENTINEL LYMPH NODE BX: SHX5760

## 2013-08-04 SURGERY — BREAST LUMPECTOMY WITH NEEDLE LOCALIZATION AND AXILLARY SENTINEL LYMPH NODE BX
Anesthesia: General | Site: Breast | Laterality: Left

## 2013-08-04 MED ORDER — MIDAZOLAM HCL 5 MG/5ML IJ SOLN
INTRAMUSCULAR | Status: DC | PRN
  Administered 2013-08-04: 1 mg via INTRAVENOUS

## 2013-08-04 MED ORDER — ONDANSETRON HCL 4 MG/2ML IJ SOLN: 4.0000 mg | Freq: Once | INTRAMUSCULAR | Status: DC | PRN

## 2013-08-04 MED ORDER — TECHNETIUM TC 99M SULFUR COLLOID FILTERED
1.0000 | Freq: Once | INTRAVENOUS | Status: AC | PRN
  Administered 2013-08-04: 1 via INTRADERMAL

## 2013-08-04 MED ORDER — ARTIFICIAL TEARS OP OINT
TOPICAL_OINTMENT | OPHTHALMIC | Status: DC | PRN
  Administered 2013-08-04: 1 via OPHTHALMIC

## 2013-08-04 MED ORDER — FENTANYL CITRATE 0.05 MG/ML IJ SOLN
INTRAMUSCULAR | Status: DC | PRN
  Administered 2013-08-04: 50 ug via INTRAVENOUS
  Administered 2013-08-04: 100 ug via INTRAVENOUS
  Administered 2013-08-04: 50 ug via INTRAVENOUS

## 2013-08-04 MED ORDER — HYDROCODONE-ACETAMINOPHEN 5-325 MG PO TABS: 1.0000 | ORAL_TABLET | Freq: Four times a day (QID) | ORAL | Status: DC | PRN

## 2013-08-04 MED ORDER — LACTATED RINGERS IV SOLN
INTRAVENOUS | Status: DC
  Administered 2013-08-04: 10:00:00 via INTRAVENOUS

## 2013-08-04 MED ORDER — FENTANYL CITRATE 0.05 MG/ML IJ SOLN
100.0000 ug | Freq: Once | INTRAMUSCULAR | Status: AC
  Administered 2013-08-04: 100 ug via INTRAVENOUS

## 2013-08-04 MED ORDER — MIDAZOLAM HCL 5 MG/ML IJ SOLN: 2.0000 mg | Freq: Once | INTRAMUSCULAR | Status: DC

## 2013-08-04 MED ORDER — MIDAZOLAM HCL 2 MG/2ML IJ SOLN
INTRAMUSCULAR | Status: AC
  Administered 2013-08-04: 2 mg
  Filled 2013-08-04: qty 2

## 2013-08-04 MED ORDER — FENTANYL CITRATE 0.05 MG/ML IJ SOLN
25.0000 ug | INTRAMUSCULAR | Status: DC | PRN
  Administered 2013-08-04 (×3): 25 ug via INTRAVENOUS

## 2013-08-04 MED ORDER — FENTANYL CITRATE 0.05 MG/ML IJ SOLN
INTRAMUSCULAR | Status: AC
  Filled 2013-08-04: qty 2

## 2013-08-04 MED ORDER — HEPARIN SODIUM (PORCINE) 5000 UNIT/ML IJ SOLN
INTRAMUSCULAR | Status: AC
  Filled 2013-08-04: qty 1

## 2013-08-04 MED ORDER — LIDOCAINE HCL (CARDIAC) 20 MG/ML IV SOLN
INTRAVENOUS | Status: DC | PRN
  Administered 2013-08-04: 50 mg via INTRAVENOUS

## 2013-08-04 MED ORDER — SODIUM CHLORIDE 0.9 % IV SOLN: INTRAVENOUS | Status: DC

## 2013-08-04 MED ORDER — ONDANSETRON HCL 4 MG/2ML IJ SOLN
INTRAMUSCULAR | Status: DC | PRN
  Administered 2013-08-04: 4 mg via INTRAVENOUS

## 2013-08-04 MED ORDER — SODIUM CHLORIDE 0.9 % IR SOLN
Status: DC | PRN
  Administered 2013-08-04: 1000 mL

## 2013-08-04 MED ORDER — BUPIVACAINE-EPINEPHRINE 0.25% -1:200000 IJ SOLN
INTRAMUSCULAR | Status: DC | PRN
  Administered 2013-08-04: 18 mL

## 2013-08-04 MED ORDER — LACTATED RINGERS IV SOLN
INTRAVENOUS | Status: DC | PRN
  Administered 2013-08-04 (×2): via INTRAVENOUS

## 2013-08-04 MED ORDER — PROPOFOL 10 MG/ML IV BOLUS
INTRAVENOUS | Status: DC | PRN
  Administered 2013-08-04: 180 mg via INTRAVENOUS

## 2013-08-04 SURGICAL SUPPLY — 63 items
ADH SKN CLS APL DERMABOND .7 (GAUZE/BANDAGES/DRESSINGS) ×1
APL SKNCLS STERI-STRIP NONHPOA (GAUZE/BANDAGES/DRESSINGS) ×1
APPLIER CLIP 9.375 MED OPEN (MISCELLANEOUS) ×4
APR CLP MED 9.3 20 MLT OPN (MISCELLANEOUS) ×2
BENZOIN TINCTURE PRP APPL 2/3 (GAUZE/BANDAGES/DRESSINGS) ×1 IMPLANT
BINDER BREAST XLRG (GAUZE/BANDAGES/DRESSINGS) IMPLANT
BLADE SURG 10 STRL SS (BLADE) ×2 IMPLANT
BLADE SURG 15 STRL LF DISP TIS (BLADE) ×1 IMPLANT
BLADE SURG 15 STRL SS (BLADE) ×2
CANISTER SUCTION 2500CC (MISCELLANEOUS) ×2 IMPLANT
CHLORAPREP W/TINT 26ML (MISCELLANEOUS) ×2 IMPLANT
CLIP APPLIE 9.375 MED OPEN (MISCELLANEOUS) ×1 IMPLANT
CLSR STERI-STRIP ANTIMIC 1/2X4 (GAUZE/BANDAGES/DRESSINGS) ×1 IMPLANT
CONT SPEC 4OZ CLIKSEAL STRL BL (MISCELLANEOUS) ×1 IMPLANT
CONT SPEC STER OR (MISCELLANEOUS) ×4 IMPLANT
COVER PROBE W GEL 5X96 (DRAPES) ×2 IMPLANT
COVER SURGICAL LIGHT HANDLE (MISCELLANEOUS) ×2 IMPLANT
DERMABOND ADVANCED (GAUZE/BANDAGES/DRESSINGS) ×1
DERMABOND ADVANCED .7 DNX12 (GAUZE/BANDAGES/DRESSINGS) IMPLANT
DEVICE DUBIN SPECIMEN MAMMOGRA (MISCELLANEOUS) ×2 IMPLANT
DRAPE CHEST BREAST 15X10 FENES (DRAPES) ×2 IMPLANT
DRSG PAD ABDOMINAL 8X10 ST (GAUZE/BANDAGES/DRESSINGS) IMPLANT
DRSG TEGADERM 4X4.75 (GAUZE/BANDAGES/DRESSINGS) ×1 IMPLANT
ELECT CAUTERY BLADE 6.4 (BLADE) ×2 IMPLANT
ELECT REM PT RETURN 9FT ADLT (ELECTROSURGICAL) ×2
ELECTRODE REM PT RTRN 9FT ADLT (ELECTROSURGICAL) ×1 IMPLANT
GAUZE SPONGE 2X2 8PLY STRL LF (GAUZE/BANDAGES/DRESSINGS) IMPLANT
GLOVE BIO SURGEON STRL SZ7 (GLOVE) ×4 IMPLANT
GLOVE BIOGEL PI IND STRL 7.0 (GLOVE) IMPLANT
GLOVE BIOGEL PI IND STRL 7.5 (GLOVE) ×1 IMPLANT
GLOVE BIOGEL PI INDICATOR 7.0 (GLOVE) ×2
GLOVE BIOGEL PI INDICATOR 7.5 (GLOVE) ×1
GOWN STRL NON-REIN LRG LVL3 (GOWN DISPOSABLE) ×4 IMPLANT
KIT BASIN OR (CUSTOM PROCEDURE TRAY) ×2 IMPLANT
KIT MARKER MARGIN INK (KITS) ×1 IMPLANT
KIT ROOM TURNOVER OR (KITS) ×2 IMPLANT
NDL 18GX1X1/2 (RX/OR ONLY) (NEEDLE) IMPLANT
NDL HYPO 25GX1X1/2 BEV (NEEDLE) ×1 IMPLANT
NEEDLE 18GX1X1/2 (RX/OR ONLY) (NEEDLE) IMPLANT
NEEDLE HYPO 25GX1X1/2 BEV (NEEDLE) ×2 IMPLANT
NS IRRIG 1000ML POUR BTL (IV SOLUTION) ×2 IMPLANT
PACK SURGICAL SETUP 50X90 (CUSTOM PROCEDURE TRAY) ×2 IMPLANT
PAD ARMBOARD 7.5X6 YLW CONV (MISCELLANEOUS) ×2 IMPLANT
PENCIL BUTTON HOLSTER BLD 10FT (ELECTRODE) ×2 IMPLANT
SPONGE GAUZE 2X2 STER 10/PKG (GAUZE/BANDAGES/DRESSINGS) ×1
SPONGE GAUZE 4X4 12PLY (GAUZE/BANDAGES/DRESSINGS) IMPLANT
SPONGE LAP 18X18 X RAY DECT (DISPOSABLE) ×2 IMPLANT
STAPLER VISISTAT 35W (STAPLE) ×2 IMPLANT
STRIP CLOSURE SKIN 1/2X4 (GAUZE/BANDAGES/DRESSINGS) IMPLANT
SUT MNCRL AB 4-0 PS2 18 (SUTURE) ×4 IMPLANT
SUT SILK 2 0 SH (SUTURE) IMPLANT
SUT VIC AB 2-0 CT1 27 (SUTURE) ×2
SUT VIC AB 2-0 CT1 TAPERPNT 27 (SUTURE) IMPLANT
SUT VIC AB 2-0 SH 27 (SUTURE) ×4
SUT VIC AB 2-0 SH 27XBRD (SUTURE) ×2 IMPLANT
SUT VIC AB 3-0 SH 27 (SUTURE) ×4
SUT VIC AB 3-0 SH 27XBRD (SUTURE) ×2 IMPLANT
SYR BULB 3OZ (MISCELLANEOUS) ×1 IMPLANT
SYR CONTROL 10ML LL (SYRINGE) ×2 IMPLANT
TOWEL OR 17X24 6PK STRL BLUE (TOWEL DISPOSABLE) ×2 IMPLANT
TOWEL OR 17X26 10 PK STRL BLUE (TOWEL DISPOSABLE) ×2 IMPLANT
TUBE CONNECTING 12X1/4 (SUCTIONS) ×2 IMPLANT
YANKAUER SUCT BULB TIP NO VENT (SUCTIONS) ×2 IMPLANT

## 2013-08-04 NOTE — Anesthesia Postprocedure Evaluation (Signed)
  Anesthesia Post-op Note  Patient: Alyssa [name]  Procedure(s) Performed: Procedure(s): BREAST LUMPECTOMY WITH NEEDLE LOCALIZATION AND AXILLARY SENTINEL LYMPH NODE Biopsy x 3 (Left)  Patient Location: PACU  Anesthesia Type:General  Level of Consciousness: awake, alert  and oriented  Airway and Oxygen Therapy: Patient Spontanous Breathing and Patient connected to nasal cannula oxygen  Post-op Pain: mild  Post-op Assessment: Post-op Vital signs reviewed, Patient's Cardiovascular Status Stable, Respiratory Function Stable and Pain level controlled  Post-op Vital Signs: stable  Complications: No apparent anesthesia complications

## 2013-08-04 NOTE — Op Note (Signed)
Preoperative diagnosis: left breast cancer, clinical stage I Postoperative diagnosis: saa Procedure: Left breast partial mastectomy, left axillary sentinel node biopsy Surgeon: Dr Serita Grammes EBL: minimal Drains: none Complications: none Specimen: left breast tissue marked with paint, left axillary sentinel node x3 with highest count of 1169 Disposition to recovery stable Sponge and needle count correct at completion  Indications: This is a 63 year old female who presents with a clinical stage I left breast cancer. She was seen in our multidisciplinary clinic. She has elected to proceed with breast conservation therapy. We discussed the risks and benefits of this prior to beginning.  Procedure: After informed consent was obtained the patient first had a wire placed. I had these mammograms in the operating room. She was given ciprofloxacin due to her allergies. Sequential compression devices on her legs. Her history of pulmonary embolus I did give her a dose of subcutaneous heparin preoperatively. She was then placed under general anesthesia. Her breast and axilla were then prepped and draped in the standard sterile surgical fashion. A surgical timeout was performed.  I made a curvilinear incision overlying the lesion. I brought the wire in from its remote position. With an attempt to get a clear margin around the tumor I then removed the wire as well as the surrounding tissue. This was then marked with paint. Hemostasis obtained in this cavity. I then did a mammogram in the operating room which confirmed removal of the clip, mass, and wire. It appeared all margins were clear. I then closed this with 2-0 Vicryl. The dermis was closed with 3-0 Vicryl. The skin was closed for Monocryl and Dermabond was placed over this. I eventually injected this with Marcaine also.  I identified the location of her sentinel node. I made a 2 cm incision in the left axilla underneath the hairline. I dissected through  the axillary fascia. I then identified 3 areas of sentinel nodes with the highest counts listed above. The background radioactivity was less than 40. There were no enlarged nodes I could identify. I then passed these off the table. Hemostasis was obtained. I closed the axillary fascia with 2-0 Vicryl. The dermis was closed with 3-0 Vicryl and the skin with 4 Monocryl. A sterile dressing was placed on this. A breast binder was placed. She tolerated this well was extubated and transferred to the recovery room stable.

## 2013-08-04 NOTE — Anesthesia Procedure Notes (Signed)
Procedure Name: LMA Insertion Date/Time: 08/04/2013 11:16 AM Performed by: Ned Grace Pre-anesthesia Checklist: Patient identified, Patient being monitored, Emergency Drugs available, Timeout performed and Suction available Patient Re-evaluated:Patient Re-evaluated prior to inductionOxygen Delivery Method: Circle system utilized Preoxygenation: Pre-oxygenation with 100% oxygen Intubation Type: IV induction LMA: LMA inserted LMA Size: 4.0 Number of attempts: 1 Placement Confirmation: positive ETCO2 and breath sounds checked- equal and bilateral Tube secured with: Tape Dental Injury: Teeth and Oropharynx as per pre-operative assessment

## 2013-08-04 NOTE — Interval H&P Note (Signed)
History and Physical Interval Note:  08/04/2013 10:17 AM  Alyssa [name]  has presented today for surgery, with the diagnosis of left breast cancer   The various methods of treatment have been discussed with the patient and family. After consideration of risks, benefits and other options for treatment, the patient has consented to  Procedure(s): BREAST LUMPECTOMY WITH NEEDLE LOCALIZATION AND AXILLARY SENTINEL LYMPH NODE BX (Left) as a surgical intervention .  The patient's history has been reviewed, patient examined, no change in status, stable for surgery.  I have reviewed the patient's chart and labs.  Questions were answered to the patient's satisfaction.     Kamuela Magos

## 2013-08-04 NOTE — Anesthesia Preprocedure Evaluation (Addendum)
Anesthesia Evaluation  Patient identified by MRN, date of birth, ID band Patient awake    Reviewed: Allergy & Precautions, H&P , NPO status , Patient's Chart, lab work & pertinent test results  History of Anesthesia Complications Negative for: history of anesthetic complications  Airway Mallampati: II TM Distance: >3 FB Neck ROM: Full    Dental  (+) Teeth Intact and Dental Advisory Given   Pulmonary  breath sounds clear to auscultation  Pulmonary exam normal       Cardiovascular hypertension, Pt. on medications Rhythm:Regular Rate:Normal     Neuro/Psych  Headaches, PSYCHIATRIC DISORDERS Post concussive syndrome. Symptoms include difficulty with date/time and word ordering.   GI/Hepatic negative GI ROS, Neg liver ROS,   Endo/Other  Hypothyroidism   Renal/GU negative Renal ROS     Musculoskeletal   Abdominal   Peds  Hematology PE following previous surgery.   Anesthesia Other Findings   Reproductive/Obstetrics                         Anesthesia Physical Anesthesia Plan  ASA: III  Anesthesia Plan: General   Post-op Pain Management:    Induction: Intravenous  Airway Management Planned: LMA  Additional Equipment:   Intra-op Plan:   Post-operative Plan:   Informed Consent: I have reviewed the patients History and Physical, chart, labs and discussed the procedure including the risks, benefits and alternatives for the proposed anesthesia with the patient or authorized representative who has indicated his/her understanding and acceptance.   Dental advisory given  Plan Discussed with: CRNA, Anesthesiologist and Surgeon  Anesthesia Plan Comments:         Anesthesia Quick Evaluation

## 2013-08-04 NOTE — H&P (View-Only) (Signed)
Patient ID: GALAXY BORDEN, female   DOB: April 23, 1951, 63 y.o.   MRN: [id number]  Chief Complaint  Patient presents with  . Other    HPI Alyssa [name] is a 63 y.o. female.  Referred by Dr Lynne Logan HPI 28 yof who underwent routine screening mammogram recently with left breast abnormality.  She has breast density category b.  Ultrasound shows a left breast mass measuring 1.1x0.9x1.1 cm in size.  Normal axillary contents visualized.  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  She comes today to discuss options. 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  She can only take hydrocodone not vicodin per her report. She had postop pe after lap ventral hernia repair last year also.  Finished taking coumadin a year ago.  Past Medical History  Diagnosis Date  . Hyperlipidemia   . Thyroid disease   . Abdominal pain   . Post concussion syndrome   . Ventral hernia   . Hypertension     PCP DR Nolon Rod  . Recurrent upper respiratory infection (URI)     FLU 08/23/11-08/29/11 then URI 08/29/11- 09/08/11- S/P zpack and steroids-  improved      no cough or congestion  . Hypothyroidism   . Post concussion syndrome     4 yrs ago- sees [facility]- Dr Leonie Man every 6 months-   has sleep study 2011 following accident but was neg per patient-  short term memory issues, dates and times  . Headache(784.0)   . Hernia, incisional periumbilical, incarcerated 9/79/8921  . Thyroid mass, left inferior lobe, 3.6cm [id number] 10/07/2011  . Pulmonary embolism   . Hot flashes     Past Surgical History  Procedure Laterality Date  . Cholecystectomy  2006    lap chole, Dr. Bubba Camp  . Appendectomy  2002    lap appy.  Dr. Hassell Done  . Carpel tunnel      left in April 2011, right in June 2012  . Tarsal tunnel release      bilateral  . Cesarean section      x2  . Hernia repair  09/15/11    Ventral w/mesh  . Ventral hernia repair  09/29/2011   Procedure: LAPAROSCOPIC VENTRAL HERNIA;  Surgeon: Adin Hector, MD;  Location: [facility];  Service: General;  Laterality: N/A;  Laparoscopic Ventral [name] Hernia Repair with Mesh    Family History  Problem Relation Age of Onset  . Malignant hyperthermia Mother   . Heart attack Father   . Diabetes      Grandmother  . Other      respiratory- Grandmother    Social History History  Substance Use Topics  . Smoking status: Never Smoker   . Smokeless tobacco: Never Used  . Alcohol Use: No    Allergies  Allergen Reactions  . Codeine Other (See Comments)    hallucinations  . Penicillins Hives, Swelling and Rash    All over the body.  Marland Kitchen Percocet [Oxycodone-Acetaminophen] Other (See Comments)    Causes syncope day after medication has been taken.  . Vicodin [Hydrocodone-Acetaminophen] Other (See Comments)    Causes syncope day after medication has been taken.    Current Outpatient Prescriptions  Medication Sig Dispense Refill  . ALPRAZolam (XANAX) 0.5 MG tablet       . cetirizine (ZYRTEC) 10 MG tablet Take 10 mg by mouth every evening.       . divalproex (DEPAKOTE)  500 MG DR tablet Take 1 tablet (500 mg total) by mouth at bedtime.  30 tablet  11  . hydrochlorothiazide (HYDRODIURIL) 25 MG tablet Take 25 mg by mouth at bedtime.       Marland Kitchen thyroid (ARMOUR) 32.5 MG tablet Take 32.5 mg by mouth 2 (two) times daily.      . traZODone (DESYREL) 50 MG tablet Take 50 mg by mouth at bedtime.      Marland Kitchen VITAMIN D, CHOLECALCIFEROL, PO Take 1 capsule by mouth. Vitamin D 15, 000 units with Vitamin K       No current facility-administered medications for this visit.    Review of Systems Review of Systems  Constitutional: Negative for fever, chills and unexpected weight change.  HENT: Negative for congestion, hearing loss, sore throat, trouble swallowing and voice change.   Eyes: Negative for visual disturbance.  Respiratory: Negative for cough and wheezing.   Cardiovascular: Negative for chest pain,  palpitations and leg swelling.  Gastrointestinal: Negative for nausea, vomiting, abdominal pain, diarrhea, constipation, blood in stool, abdominal distention and anal bleeding.  Genitourinary: Negative for hematuria, vaginal bleeding and difficulty urinating.  Musculoskeletal: Negative for arthralgias.  Skin: Negative for rash and wound.  Neurological: Negative for seizures, syncope and headaches.  Hematological: Negative for adenopathy. Does not bruise/bleed easily.  Psychiatric/Behavioral: Negative for confusion.    There were no vitals taken for this visit.  Physical Exam Physical Exam  Vitals reviewed. Constitutional: She appears well-developed and well-nourished.  Eyes: No scleral icterus.  Neck: Neck supple.  Cardiovascular: Normal rate, regular rhythm and normal heart sounds.   Pulmonary/Chest: Effort normal and breath sounds normal. She has no wheezes. She has no rales. Right breast exhibits no inverted nipple, no mass, no nipple discharge, no skin change and no tenderness. Left breast exhibits no inverted nipple, no mass, no nipple discharge, no skin change and no tenderness.  Abdominal: Soft.  Lymphadenopathy:    She has no cervical adenopathy.    She has no axillary adenopathy.       Right: No supraclavicular adenopathy present.       Left: No supraclavicular adenopathy present.    Data Reviewed EXAM:  DIGITAL DIAGNOSTIC left MAMMOGRAM  ULTRASOUND left BREAST  COMPARISON: None.  ACR Breast Density Category b: There are scattered areas of  fibroglandular density.  FINDINGS:  Spot compression views in the upper inner quadrant of the left  breast, posteriorly confirm the presence of an irregular high  density mass with ill-defined margins.  On physical exam I palpate thickening in the 11 o'clock position of  the left breast, 5 cm the nipple. No discrete mass is palpable.  Normal axillary contents are palpated on the left.  Ultrasound is performed, showing an  irregular hypoechoic mass with  ill-defined margins in the 11 o'clock position, 5 cm in the nipple  is measuring 1.1 x 0.9 x 1.1 cm. Normal axillary contents are  imaged.  IMPRESSION:  Mass, left breast for which a biopsy is done and reported  separately.    Assessment    Left breast cancer, clinical stage I    Plan    Left breast wire guided lumpectomy, left axillary sentinel node biopsy  We discussed the staging and pathophysiology of breast cancer. We discussed all of the different options for treatment for breast cancer including surgery, chemotherapy, radiation therapy, Herceptin, and antiestrogen therapy.   We discussed a sentinel lymph node biopsy as she does not appear to having lymph node involvement right  now. We discussed the performance of that with injection of radioactive tracer and possibly blue dye. We discussed that she would have an incision underneath her axillary hairline. We discussed that there is a chance of having a positive node with a sentinel lymph node biopsy and we will await the permanent pathology to make any other first further decisions in terms of her treatment. One of these options might be to return to the operating room to perform an axillary lymph node dissection. We discussed up to a 5% risk lifetime of chronic shoulder pain as well as lymphedema associated with a sentinel lymph node biopsy.  We discussed the options for treatment of the breast cancer which included lumpectomy versus a mastectomy. We discussed the performance of the lumpectomy with a wire placement. We discussed a 5-10% chance of a positive margin requiring reexcision in the operating room. We also discussed that she may need radiation therapy or antiestrogen therapy or both if she undergoes lumpectomy. We discussed the mastectomy and the postoperative care for that as well. We discussed that there is no difference in her survival whether she undergoes lumpectomy with radiation therapy or  antiestrogen therapy versus a mastectomy.  Due to prior pulmonary embolus associated with operation will use scds and give her preop heparin with early ambulation also. We discussed the risks of operation including bleeding, infection, possible reoperation. She understands her further therapy will be based on what her stages at the time of her operation.          Alyssa [name] 07/30/2013, 9:58 AM

## 2013-08-04 NOTE — Discharge Instructions (Signed)
[place] Office Phone Number [phone]  BREAST BIOPSY/ PARTIAL MASTECTOMY: POST OP INSTRUCTIONS  Always review your discharge instruction sheet given to you by the facility where your surgery was performed.  IF YOU HAVE DISABILITY OR FAMILY LEAVE FORMS, YOU MUST BRING THEM TO THE OFFICE FOR PROCESSING.  DO NOT GIVE THEM TO YOUR DOCTOR.  1. A prescription for pain medication may be given to you upon discharge.  Take your pain medication as prescribed, if needed.  If narcotic pain medicine is not needed, then you may take acetaminophen (Tylenol), naprosyn (Alleve) or ibuprofen (Advil) as needed. 2. Take your usually prescribed medications unless otherwise directed 3. If you need a refill on your pain medication, please contact your pharmacy.  They will contact our office to request authorization.  Prescriptions will not be filled after 5pm or on week-ends. 4. You should eat very light the first 24 hours after surgery, such as soup, crackers, pudding, etc.  Resume your normal diet the day after surgery. 5. Most patients will experience some swelling and bruising in the breast.  Ice packs and a good support bra will help.  Wear the breast binder provided or a sports bra for 72 hours day and night.  After that wear a sports bra during the day until you return to the office. Swelling and bruising can take several days to resolve.  6. It is common to experience some constipation if taking pain medication after surgery.  Increasing fluid intake and taking a stool softener will usually help or prevent this problem from occurring.  A mild laxative (Milk of Magnesia or Miralax) should be taken according to package directions if there are no bowel movements after 48 hours. 7. Unless discharge instructions indicate otherwise, you may remove your bandages 48 hours after surgery and you may shower at that time.  You may have steri-strips (small skin tapes) in place directly over the incision.   These strips should be left on the skin for 7-10 days and will come off on their own.  If your surgeon used skin glue on the incision, you may shower in 24 hours.  The glue will flake off over the next 2-3 weeks.  Any sutures or staples will be removed at the office during your follow-up visit. 8. ACTIVITIES:  You may resume regular daily activities (gradually increasing) beginning the next day.  Wearing a good support bra or sports bra minimizes pain and swelling.  You may have sexual intercourse when it is comfortable. a. You may drive when you no longer are taking prescription pain medication, you can comfortably wear a seatbelt, and you can safely maneuver your car and apply brakes. b. RETURN TO WORK:  ______________________________________________________________________________________ 9. You should see your doctor in the office for a follow-up appointment approximately two weeks after your surgery.  Your doctors nurse will typically make your follow-up appointment when she calls you with your pathology report.  Expect your pathology report 3-4 business days after your surgery.  You may call to check if you do not hear from [place] after three days. 10. OTHER INSTRUCTIONS: _______________________________________________________________________________________________ _____________________________________________________________________________________________________________________________________ _____________________________________________________________________________________________________________________________________ _____________________________________________________________________________________________________________________________________  WHEN TO CALL DR Anaria Kroner: 1. Fever over 101.0 2. Nausea and/or vomiting. 3. Extreme swelling or bruising. 4. Continued bleeding from incision. 5. Increased pain, redness, or drainage from the incision.  The clinic staff is available to  answer your questions during regular business hours.  Please dont hesitate to call and ask to speak to one of the nurses for  clinical concerns.  If you have a medical emergency, go to the nearest emergency room or call 911.  A surgeon from [facility] is always on call at the hospital.  For further questions, please visit [email or web address]

## 2013-08-04 NOTE — Preoperative (Signed)
Beta Blockers   Reason not to administer Beta Blockers:Not Applicable 

## 2013-08-04 NOTE — Transfer of Care (Signed)
Immediate Anesthesia Transfer of Care Note  Patient: Alyssa [name]  Procedure(s) Performed: Procedure(s): BREAST LUMPECTOMY WITH NEEDLE LOCALIZATION AND AXILLARY SENTINEL LYMPH NODE Biopsy x 3 (Left)  Patient Location: PACU  Anesthesia Type:General  Level of Consciousness: awake, alert , oriented and patient cooperative  Airway & Oxygen Therapy: Patient Spontanous Breathing and Patient connected to nasal cannula oxygen  Post-op Assessment: Report given to PACU RN, Post -op Vital signs reviewed and stable and Patient moving all extremities  Post vital signs: Reviewed and stable  Complications: No apparent anesthesia complications

## 2013-08-05 ENCOUNTER — Encounter: Payer: Self-pay | Admitting: *Deleted

## 2013-08-05 NOTE — Progress Notes (Signed)
[place] Psychosocial Distress Screening  Clinical Social Work   Patient completed distress screening protocol, and scored a 6 on the Psychosocial Distress Thermometer which indicates moderate distress. Clinical Social Worker met with patient in [facility] on 07/30/13 to assess for distress and other psychosocial needs. Pt stated that "this was all a big surprise", but after meeting with the treatment team and getting more inforamtion she felt more comfrotable. CSW acknowledged and validated pt's concerns and informed pt of the Support team and support services at [facility]. CSW encouraged pt to call with any additional questions or concerns.    Johnnye Lana, MSW, [place] Worker  [facility]  [phone]

## 2013-08-07 ENCOUNTER — Telehealth: Payer: Self-pay | Admitting: *Deleted

## 2013-08-07 ENCOUNTER — Encounter (HOSPITAL_COMMUNITY): Payer: Self-pay | Admitting: General Surgery

## 2013-08-07 NOTE — Telephone Encounter (Signed)
CALLED PATIENT TO INFORM OF [place] APPT., SPOKE WITH PATIENT AND SHE IS AWARE OF THIS APPT.

## 2013-08-07 NOTE — Telephone Encounter (Signed)
Called and spoke with patient from [facility] 07/30/13.  Patient states she is doing well after her surgery.  No questions or concerns at this time.    Oncotype ordered 08/07/13. Faxed requisition form to Pathology and confirmed receipt with [facility]. Faxed PAC to [place].  Emailed Dr. Jana Hakim that onoctype has been ordered.

## 2013-08-11 ENCOUNTER — Other Ambulatory Visit: Payer: Self-pay | Admitting: Physician Assistant

## 2013-08-11 DIAGNOSIS — C50212 Malignant neoplasm of upper-inner quadrant of left female breast: Secondary | ICD-10-CM

## 2013-08-12 ENCOUNTER — Ambulatory Visit (INDEPENDENT_AMBULATORY_CARE_PROVIDER_SITE_OTHER): Payer: BC Managed Care – PPO | Admitting: General Surgery

## 2013-08-12 ENCOUNTER — Encounter (INDEPENDENT_AMBULATORY_CARE_PROVIDER_SITE_OTHER): Payer: Self-pay

## 2013-08-12 ENCOUNTER — Encounter: Payer: Self-pay | Admitting: *Deleted

## 2013-08-12 ENCOUNTER — Telehealth: Payer: Self-pay | Admitting: *Deleted

## 2013-08-12 ENCOUNTER — Encounter (INDEPENDENT_AMBULATORY_CARE_PROVIDER_SITE_OTHER): Payer: Self-pay | Admitting: General Surgery

## 2013-08-12 VITALS — BP 138/82 | HR 74 | Resp 16 | Ht 62.5 in | Wt 216.0 lb

## 2013-08-12 DIAGNOSIS — Z09 Encounter for follow-up examination after completed treatment for conditions other than malignant neoplasm: Secondary | ICD-10-CM

## 2013-08-12 NOTE — Telephone Encounter (Signed)
Faxed PAC w/ office note and path report to Levada Dy at [facility].

## 2013-08-12 NOTE — Progress Notes (Signed)
Subjective:     Patient ID: Alyssa [name], female   DOB: May 17, 1951, 63 y.o.   MRN: [id number]  HPI This is a 63 year old female who was initially seen in the multidisciplinary breast clinic. She has now undergone a left breast lumpectomy and left axillary sentinel node biopsy. She has done well since then.Her pathology returned as a invasive ductal carcinoma, grade 1, 1.1 cm with negative margins. 3 sentinel nodes were all negative. She has an oncotype pending to determine further systemic therapy.  Review of Systems     Objective:   Physical Exam Healing left breast and left axillary incision without infection or hematoma    Assessment:     Stage I left breast cancer status post lumpectomy and sentinel node biopsy     Plan:     She is doing well. I told her she can increase her activity and again driving. I provided her a copy of her pathology. We will wait on her additional testing to determine whether adjuvant therapy she will need. She has a point to see both medical and radiation oncology set up. I will base my further follow up on her next open treatment.

## 2013-08-12 NOTE — Progress Notes (Signed)
Faxed Care Plan to [facility] at [facility] & to PCP.  Took to Med Rec to scan.

## 2013-08-19 ENCOUNTER — Encounter (HOSPITAL_COMMUNITY): Payer: Self-pay

## 2013-08-20 ENCOUNTER — Encounter: Payer: Self-pay | Admitting: *Deleted

## 2013-08-20 NOTE — Progress Notes (Signed)
Received Oncotype Dx result of 14.  Emailed Dr. Jana Hakim with result and placed a copy in his box.  Took a copy to Med Rec to scan.

## 2013-08-22 ENCOUNTER — Encounter: Payer: Self-pay | Admitting: Radiation Oncology

## 2013-08-22 NOTE — Progress Notes (Signed)
Location of Breast Cancer: left, 11:00 o'clock  Histology per Pathology Report:  07/21/14 Breast, left, needle core biopsy, mass, 11 o'clock - INVASIVE DUCTAL CARCINOMA.  08/04/13 Diagnosis 1. Lymph node, sentinel, biopsy, Left axillary #1 - ONE BENIGN LYMPH NODE WITH NO TUMOR SEEN (0/1). 2. Breast, lumpectomy, Left - INVASIVE GRADE I DUCTAL CARCINOMA WITH MICROCALCIFICATIONS, SPANNING 1.1 CM IN GREATEST DIMENSION. - ASSOCIATED LOW GRADE DUCTAL CARCINOMA IN SITU. - MARGINS ARE NEGATIVE. - SEE ONCOLOGY TEMPLATE. 3. Lymph node, sentinel, biopsy, Left axillary 2 - ONE BENIGN LYMPH NODE WITH NO TUMOR SEEN (0/1). 4. Lymph node, sentinel, biopsy, Left axillary #3 - ONE BENIGN LYMPH NODE WITH NO TUMOR SEEN (0/1).  Receptor Status: ER(100%), PR (86%), Her2-neu (-)  Did patient present with symptoms (if so, please note symptoms) or was this found on screening mammography?: first mammogram  Past/Anticipated interventions by surgeon, if any: 08/04/13 left lumpectomy, node sampling   Past/Anticipated interventions by medical oncology, if any: Chemotherapy : Dr Jana Hakim- her benefit from chemotherapy is likely to be marginal. We are sending an Oncotype DX according to NCCN guidelines but my expectation is that she will not need chemotherapy and will be able to proceed directly to radiation once she recovers from her upcoming surgery.  On the other hand she will likely derive significant benefit from antiestrogen. These will be discussed once she completes her radiation treatments. She will have a bone density some time before that visit to help [place] with the initial choice of antiestrogen.  Lymphedema issues, if any:   no  Pain issues, if any:  no  SAFETY ISSUES:  Prior radiation? no  Pacemaker/ICD? no  Possible current pregnancy? no  Is the patient on methotrexate? 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 She stopped having periods approximately 1999 and took hormone replacements until 2005.     Andria Rhein, RN 08/22/2013,2:53 PM

## 2013-08-25 ENCOUNTER — Encounter: Payer: Self-pay | Admitting: Radiation Oncology

## 2013-08-26 ENCOUNTER — Encounter: Payer: Self-pay | Admitting: Radiation Oncology

## 2013-08-26 ENCOUNTER — Institutional Professional Consult (permissible substitution): Payer: Self-pay | Admitting: Radiation Oncology

## 2013-08-26 ENCOUNTER — Ambulatory Visit: Payer: BC Managed Care – PPO | Admitting: Radiation Oncology

## 2013-08-26 ENCOUNTER — Ambulatory Visit
Admission: RE | Admit: 2013-08-26 | Discharge: 2013-08-26 | Disposition: A | Discharge status: Home / Self Care | Payer: BC Managed Care – PPO | Source: Ambulatory Visit | Attending: Radiation Oncology | Admitting: Radiation Oncology

## 2013-08-26 VITALS — BP 132/73 | HR 82 | Temp 97.7°F | Resp 20 | Wt 220.0 lb

## 2013-08-26 DIAGNOSIS — C50319 Malignant neoplasm of lower-inner quadrant of unspecified female breast: Secondary | ICD-10-CM | POA: Insufficient documentation

## 2013-08-26 DIAGNOSIS — Z170 Estrogen receptor positive status [ER+]: Secondary | ICD-10-CM | POA: Insufficient documentation

## 2013-08-26 DIAGNOSIS — C50212 Malignant neoplasm of upper-inner quadrant of left female breast: Secondary | ICD-10-CM

## 2013-08-26 HISTORY — DX: Malignant neoplasm of unspecified site of unspecified female breast: C50.919

## 2013-08-26 NOTE — Progress Notes (Signed)
Please see the Nurse Progress Note in the MD Initial Consult Encounter for this patient. 

## 2013-08-26 NOTE — Progress Notes (Signed)
CC.: Dr. Rolm Bookbinder, Dr. 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 At the time of her first mammogram ([place]) on 07/01/2013 she was noted to have a possible mass within the left breast. Additional views and ultrasound showed a 1.1 x 1.1 x 0.9 cm mass at 11:00. This was biopsied and found to be diagnostic for invasive ductal carcinoma. Her tumor was ER/PR positive with a low proliferation index of 5%. She did not have a MRI scan. She had a left partial mastectomy and sentinel lymph node biopsy with Dr. Donne Hazel on 08/04/2013. She was found to have a 1.1 cm invasive ductal carcinoma with microcalcifications. Of note is that there were no suspicious microcalcifications seen on mammography. 3 sentinel lymph nodes were free of metastatic disease. Her closest margins were 0.3 cm for invasive disease, superiorly, and 0.3 cm for in situ disease, anteriorly. There was no LV I. DCIS was low-grade. As mentioned previously, her estrogen receptor was strongly positive at 100%, and PR positive at 86%. Ki-67 was 7%. Oncotype DX score is 14, low risk. She is without complaints today although she does have some shooting pains within the left breast. She will see Dr. Jana [name] for a followup visit on February 11.  Physical examination: Alert and oriented. Filed Vitals:   08/26/13 1326  BP: 132/73  Pulse: 82  Temp: 97.7 F (36.5 C)  Resp: 20   Head and neck examination: Grossly unremarkable. Nodes: Without palpable cervical, supraclavicular, or axillary lymphadenopathy. Chest: Lungs clear. Breasts: There is a left partial mastectomy wound along the superior aspect of the left breast from 11 to 12:00. The wound is healing well. There is the usual  degree of underlying induration. Right breast without masses or lesions. Abdomen without hepatomegaly. Extremities: Without edema.  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 I again explained to the patient that her local treatment options include mastectomy versus partial mastectomy followed by radiation therapy. She desires breast preservation. We discussed the potential acute and late toxicities of radiation therapy. We also discussed hypo-fractionated treatment and the possibility of utilizing deep inspiration and breath-hold technology to avoid the heart. She wishes to proceed as outlined. I will check with Dr. Jana [name] to see if it is okay to move ahead with simulation/treatment planning in view of her low Oncotype DX score. Consent is signed today.  Plan: As discussed above.  30 minutes was spent face-to-face with the patient, primarily counseling the patient and coordinating her care.

## 2013-08-27 ENCOUNTER — Other Ambulatory Visit: Payer: Self-pay | Admitting: Oncology

## 2013-09-01 ENCOUNTER — Ambulatory Visit
Admission: RE | Admit: 2013-09-01 | Discharge: 2013-09-01 | Disposition: A | Discharge status: Home / Self Care | Payer: BC Managed Care – PPO | Source: Ambulatory Visit | Attending: Radiation Oncology | Admitting: Radiation Oncology

## 2013-09-01 ENCOUNTER — Ambulatory Visit: Payer: BC Managed Care – PPO | Admitting: Physical Therapy

## 2013-09-01 DIAGNOSIS — L819 Disorder of pigmentation, unspecified: Secondary | ICD-10-CM | POA: Insufficient documentation

## 2013-09-01 DIAGNOSIS — L539 Erythematous condition, unspecified: Secondary | ICD-10-CM | POA: Insufficient documentation

## 2013-09-01 DIAGNOSIS — Z510 Encounter for antineoplastic radiation therapy: Secondary | ICD-10-CM | POA: Insufficient documentation

## 2013-09-01 DIAGNOSIS — C50212 Malignant neoplasm of upper-inner quadrant of left female breast: Secondary | ICD-10-CM

## 2013-09-01 DIAGNOSIS — C50919 Malignant neoplasm of unspecified site of unspecified female breast: Secondary | ICD-10-CM | POA: Insufficient documentation

## 2013-09-01 DIAGNOSIS — L299 Pruritus, unspecified: Secondary | ICD-10-CM | POA: Insufficient documentation

## 2013-09-01 NOTE — Progress Notes (Signed)
Complex simulation/treatment planning note: The patient was taken to the CT simulator. She was placed on a custom breast board with construction of a custom neck mold for immobilization. She was scanned free breathing. The tangential fields would have gone through the cardiac silhouette, so she was rescanned with deep inspiration/breath-hold. The cardiac silhouette moved appropriately. The CT data set was sent to the planning system for contouring of her heart, lungs, and also tumor bed. She is being set up to medial and lateral left breast tangents and 2 multileaf collimators are designed to conform the field. She is now ready for 3-D simulation with dose volume histograms. I prescribing 5000 cGy in 25 sessions. I do not plan on a boost.

## 2013-09-02 ENCOUNTER — Other Ambulatory Visit: Payer: Self-pay | Admitting: *Deleted

## 2013-09-02 DIAGNOSIS — C50212 Malignant neoplasm of upper-inner quadrant of left female breast: Secondary | ICD-10-CM

## 2013-09-03 ENCOUNTER — Other Ambulatory Visit (HOSPITAL_BASED_OUTPATIENT_CLINIC_OR_DEPARTMENT_OTHER): Payer: BC Managed Care – PPO

## 2013-09-03 ENCOUNTER — Ambulatory Visit (HOSPITAL_BASED_OUTPATIENT_CLINIC_OR_DEPARTMENT_OTHER): Payer: BC Managed Care – PPO | Admitting: Oncology

## 2013-09-03 VITALS — BP 157/79 | HR 73 | Temp 97.7°F | Resp 18 | Ht 62.5 in | Wt 223.0 lb

## 2013-09-03 DIAGNOSIS — C50219 Malignant neoplasm of upper-inner quadrant of unspecified female breast: Secondary | ICD-10-CM

## 2013-09-03 DIAGNOSIS — Z170 Estrogen receptor positive status [ER+]: Secondary | ICD-10-CM

## 2013-09-03 DIAGNOSIS — C50212 Malignant neoplasm of upper-inner quadrant of left female breast: Secondary | ICD-10-CM

## 2013-09-03 DIAGNOSIS — C50919 Malignant neoplasm of unspecified site of unspecified female breast: Secondary | ICD-10-CM

## 2013-09-03 LAB — COMPREHENSIVE METABOLIC PANEL (CC13)
ALT: 46 U/L (ref 0–55)
ANION GAP: 12 meq/L — AB (ref 3–11)
AST: 28 U/L (ref 5–34)
Albumin: 3.9 g/dL (ref 3.5–5.0)
Alkaline Phosphatase: 66 U/L (ref 40–150)
BUN: 17.6 mg/dL (ref 7.0–26.0)
CHLORIDE: 98 meq/L (ref 98–109)
CO2: 31 meq/L — AB (ref 22–29)
CREATININE: 0.8 mg/dL (ref 0.6–1.1)
Calcium: 10 mg/dL (ref 8.4–10.4)
Glucose: 116 mg/dl (ref 70–140)
Potassium: 3.2 mEq/L — ABNORMAL LOW (ref 3.5–5.1)
Sodium: 141 mEq/L (ref 136–145)
Total Bilirubin: 0.68 mg/dL (ref 0.20–1.20)
Total Protein: 7.2 g/dL (ref 6.4–8.3)

## 2013-09-03 LAB — CBC WITH DIFFERENTIAL/PLATELET
BASO%: 0.4 % (ref 0.0–2.0)
Basophils Absolute: 0 10*3/uL (ref 0.0–0.1)
EOS%: 2.4 % (ref 0.0–7.0)
Eosinophils Absolute: 0.1 10*3/uL (ref 0.0–0.5)
HEMATOCRIT: 39.3 % (ref 34.8–46.6)
HGB: 13.3 g/dL (ref 11.6–15.9)
LYMPH%: 41 % (ref 14.0–49.7)
MCH: 29.1 pg (ref 25.1–34.0)
MCHC: 33.8 g/dL (ref 31.5–36.0)
MCV: 86 fL (ref 79.5–101.0)
MONO#: 0.3 10*3/uL (ref 0.1–0.9)
MONO%: 6.5 % (ref 0.0–14.0)
NEUT#: 2.5 10*3/uL (ref 1.5–6.5)
NEUT%: 49.7 % (ref 38.4–76.8)
PLATELETS: 190 10*3/uL (ref 145–400)
RBC: 4.57 10*6/uL (ref 3.70–5.45)
RDW: 13.7 % (ref 11.2–14.5)
WBC: 4.9 10*3/uL (ref 3.9–10.3)
lymph#: 2 10*3/uL (ref 0.9–3.3)
nRBC: 0 % (ref 0–0)

## 2013-09-03 NOTE — Progress Notes (Signed)
[facility]  [phone] [phone]) [phone]     ID: Alyssa [name] OB: [birth year]  MR#: [id number]  IWP#:[id number]  PCP: Lynne Logan, MD GYN:   SU: Rolm Bookbinder OTHER MD: Arloa Koh, Antony Contras  CHIEF COMPLAINT: "I had my first mammogram ever and it showed cancer".  HISTORY OF PRESENT ILLNESS: Alyssa [name] had bilateral screening mammography at the [facility] 07/01/2013 and is suggested a possible mass in the left breast. Left diagnostic mammography and ultrasonography 07/21/2013 showed an irregular high-density mass in the upper inner quadrant of the left breast which was palpable at the 11:00 position 5 cm from the nipple. Ultrasound confirmed an irregular hypoechoic mass measuring 1.1 cm. The left axilla was unremarkable.  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  The patient's subsequent history is as detailed below.  INTERVAL HISTORY: Alyssa [name] returns today for followup of her breast cancer accompanied. Since her last visit here, she had her left lumpectomy and sentinel lymph node sampling (on 08/04/2013, accession number [id number]), showing a 1.1 cm invasive ductal carcinoma, grade 1, with negative margins. Repeat HER-2 was again not amplified. Oncotype score of 14 predicted an average rate of distant recurrence of 9% if the patient's only systemic therapy was tamoxifen for 5 years.  REVIEW OF SYSTEMS: She did well with the surgery, with only minimal pain, no fever or bleeding. She is having some "creaky pains" in the surgical breast, not the usual stabbing or stinging pains that patients can get. She sleeps poorly. She's gained 10 pounds in the last 2 months, she tells me. She feels forgetful. She has occasional  headaches. She denies depression or anxiety. She wonders if her thyroid is properly regulated by her primary care physician's. Except for these issues a detailed review of systems today was entirely negative.  PAST MEDICAL HISTORY: Past Medical History  Diagnosis Date  . Hyperlipidemia   . Thyroid disease   . Abdominal pain   . Post concussion syndrome   . Ventral hernia   . Hypertension     PCP DR Nolon Rod  . Recurrent upper respiratory infection (URI)     FLU 08/23/11-08/29/11 then URI 08/29/11- 09/08/11- S/P zpack and steroids-  improved      no cough or congestion  . Hypothyroidism   . Post concussion syndrome     4 yrs ago- sees [facility]- Dr Leonie Man every 6 months-   has sleep study 2011 following accident but was neg per patient-  short term memory issues, dates and times  . Headache(784.0)   . Hernia, incisional periumbilical, incarcerated 7/34/1937  . 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 Pulmonary embolism   . Hot flashes   . Peripheral vascular disease     pulmonary embolis-still have some  . Cancer     breast  . Breast cancer 06/2013    left upper inner    PAST SURGICAL HISTORY: Past Surgical History  Procedure Laterality Date  . Cholecystectomy  2006    lap chole, Dr. Bubba Camp  . Appendectomy  2002    lap appy.  Dr. Hassell Done  . Carpel tunnel      left in April 2011, right in June 2012  . Tarsal tunnel release      bilateral  .  Cesarean section      x2  . Hernia repair  09/15/11    Ventral w/mesh  . Ventral hernia repair  09/29/2011    Procedure: LAPAROSCOPIC VENTRAL HERNIA;  Surgeon: Adin Hector, MD;  Location: [facility];  Service: General;  Laterality: N/A;  Laparoscopic Ventral Wall Hernia Repair with Mesh  . Breast lumpectomy with needle localization and axillary sentinel lymph node bx Left 08/04/2013    Procedure: BREAST LUMPECTOMY WITH NEEDLE LOCALIZATION AND AXILLARY SENTINEL LYMPH NODE Biopsy x 3;  Surgeon: Rolm Bookbinder, MD;   Location: [facility];  Service: General;  Laterality: Left;    FAMILY HISTORY Family History  Problem Relation Age of Onset  . Malignant hyperthermia Mother   . Heart attack Father   . Stroke Father   . Diabetes      Grandmother  . Other      respiratory- Grandmother  . Cancer Maternal Grandmother 7    breast   patient's father died at the age of 32 from a myocardial infarction. The patient's mother, Alyssa [name], is alive at 4. She lives with the patient, and is very independent (drives, etc.) The patient has 2 brothers, no sisters. The patient's mother is mother was diagnosed with breast cancer at the age of 17. There is no history of breast or ovarian cancer in the family (there is a history of cervical cancer in the patient's daughter).  GYNECOLOGIC HISTORY:  Menarche age 69, first live birth age 51, the patient is [place] P2. She stopped having periods approximately 1999 and took hormone replacements until 2005.  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 She is divorced and lives with her mother Alyssa [name]. Daughter Alyssa [name] is a business woman living in [place]. Son Alyssa [name] lives in [place], also in business. The patient has 2 grandchildren. She attends [employer]    ADVANCED DIRECTIVES: Not in place   HEALTH MAINTENANCE: History  Substance Use Topics  . Smoking status: Never Smoker   . Smokeless tobacco: Never Used  . Alcohol Use: No     Colonoscopy: Never  PAP:  Bone density: Never  Lipid panel:  Allergies  Allergen Reactions  . Codeine Other (See Comments)    hallucinations  . Other Swelling    Swelling of  Eyes   Wheat ,oranges banana, zucchini,cayenne,chili  Peppers, sweet potatoes, pumpkin  . Penicillins Hives, Swelling and Rash    All over the body.  Marland Kitchen Percocet [Oxycodone-Acetaminophen] Other (See Comments)    Causes syncope day after medication has been taken.  . Vicodin [Hydrocodone-Acetaminophen] Other (See Comments)     Causes syncope day after medication has been taken.    Current Outpatient Prescriptions  Medication Sig Dispense Refill  . ALPRAZolam (XANAX) 0.5 MG tablet Take 0.5 mg by mouth at bedtime as needed.       . cetirizine (ZYRTEC) 10 MG tablet Take 10 mg by mouth every evening.       . divalproex (DEPAKOTE) 500 MG DR tablet Take 1 tablet (500 mg total) by mouth at bedtime.  30 tablet  11  . hydrochlorothiazide (HYDRODIURIL) 25 MG tablet Take 25 mg by mouth at bedtime.       Marland Kitchen HYDROcodone-acetaminophen (NORCO) 5-325 MG per tablet Take 1-2 tablets by mouth every 6 (six) hours as needed.  30 tablet  0  . thyroid (ARMOUR) 32.5 MG tablet Take 32.5 mg by mouth 2 (two) times daily.      . traZODone (  DESYREL) 50 MG tablet Take 50 mg by mouth at bedtime.      Marland Kitchen VITAMIN D, CHOLECALCIFEROL, PO Take 1 capsule by mouth. Vitamin D 15, 000 units with Vitamin K       No current facility-administered medications for this visit.    OBJECTIVE: Middle-aged white woman who appears stated age 63 Vitals:   09/03/13 1605  BP: 157/79  Pulse: 73  Temp: 97.7 F (36.5 C)  Resp: 18     Body mass index is 40.11 kg/(m^2).    ECOG FS:1 - Symptomatic but completely ambulatory  Sclerae unicteric, pupils equal and round Oropharynx clear and moist No cervical or supraclavicular adenopathy Lungs no rales or rhonchi Heart regular rate and rhythm Abd soft, nontender, positive bowel sounds MSK no focal spinal tenderness, no upper extremity lymphedema Neuro: nonfocal, well oriented, appropriate affect Breasts: The right breast is unremarkable. Left breast is status post recent lumpectomy. The cosmetic result is excellent. There is no swelling, erythema, dehiscence, or unusual tenderness. I do not palpate any suspicious masses. The left axilla is benign.   LAB RESULTS:  CMP     Component Value Date/Time   NA 142 08/01/2013 1535   NA 144 07/30/2013 0812   K 4.4 08/01/2013 1535   K 3.8 07/30/2013 0812   CL 97 08/01/2013  1535   CO2 31 08/01/2013 1535   CO2 28 07/30/2013 0812   GLUCOSE 103* 08/01/2013 1535   GLUCOSE 141* 07/30/2013 0812   BUN 19 08/01/2013 1535   BUN 21.3 07/30/2013 0812   CREATININE 1.00 08/01/2013 1535   CREATININE 0.9 07/30/2013 0812   CALCIUM 9.3 08/01/2013 1535   CALCIUM 9.3 07/30/2013 0812   PROT 7.0 07/30/2013 0812   PROT 6.4 10/08/2011 0525   ALBUMIN 3.8 07/30/2013 0812   ALBUMIN 2.8* 10/08/2011 0525   AST 23 07/30/2013 0812   AST 119* 10/08/2011 0525   ALT 37 07/30/2013 0812   ALT 155* 10/08/2011 0525   ALKPHOS 55 07/30/2013 0812   ALKPHOS 151* 10/08/2011 0525   BILITOT 0.62 07/30/2013 0812   BILITOT 1.0 10/08/2011 0525   GFRNONAA 60* 08/01/2013 1535   GFRAA 69* 08/01/2013 1535    I No results found for this basename: SPEP,  UPEP,   kappa and lambda light chains    Lab Results  Component Value Date   WBC 4.9 09/03/2013   NEUTROABS 2.5 09/03/2013   HGB 13.3 09/03/2013   HCT 39.3 09/03/2013   MCV 86.0 09/03/2013   PLT 190 09/03/2013      Chemistry      Component Value Date/Time   NA 142 08/01/2013 1535   NA 144 07/30/2013 0812   K 4.4 08/01/2013 1535   K 3.8 07/30/2013 0812   CL 97 08/01/2013 1535   CO2 31 08/01/2013 1535   CO2 28 07/30/2013 0812   BUN 19 08/01/2013 1535   BUN 21.3 07/30/2013 0812   CREATININE 1.00 08/01/2013 1535   CREATININE 0.9 07/30/2013 0812      Component Value Date/Time   CALCIUM 9.3 08/01/2013 1535   CALCIUM 9.3 07/30/2013 0812   ALKPHOS 55 07/30/2013 0812   ALKPHOS 151* 10/08/2011 0525   AST 23 07/30/2013 0812   AST 119* 10/08/2011 0525   ALT 37 07/30/2013 0812   ALT 155* 10/08/2011 0525   BILITOT 0.62 07/30/2013 0812   BILITOT 1.0 10/08/2011 0525       No results found for this basename: LABCA2    No components found with  this basename: [id number]    No results found for this basename: INR,  in the last 168 hours  Urinalysis    Component Value Date/Time   COLORURINE AMBER* 10/07/2011 0501   APPEARANCEUR CLOUDY* 10/07/2011 0501   LABSPEC 1.034* 10/07/2011 0501   PHURINE 5.5 10/07/2011 0501    GLUCOSEU NEGATIVE 10/07/2011 0501   HGBUR NEGATIVE 10/07/2011 0501   BILIRUBINUR SMALL* 10/07/2011 0501   KETONESUR TRACE* 10/07/2011 0501   PROTEINUR NEGATIVE 10/07/2011 0501   UROBILINOGEN 0.2 10/07/2011 0501   NITRITE NEGATIVE 10/07/2011 0501   LEUKOCYTESUR NEGATIVE 10/07/2011 0501    STUDIES: No results found.  ASSESSMENT: 63 y.o. [place] woman status post left breast biopsy 07/21/2013 for a clinical T1 N0, stage IA invasive ductal carcinoma, grade 1, estrogen and progesterone receptor are both strongly positive, HER-2 not amplified, with an MIB-1 of 5%  (1) coagulopathy: History of pulmonary embolism documented by CT angiography 10/07/2011 (9 days after ventral hernia repair); Dopplers 11-2011 showed no lower extremity DVTs; the patient received Coumadin for 10 months. Hypercoagulable panel sent today, results pending  (2) status post left lumpectomy and sentinel lymph node sampling 08/04/2013 for a pT1c pN0, stage IA invasive ductal carcinoma, grade 1, with repeat HER-2 again negative.  (3) Oncotype score of 14 predicts a risk of outside the breast recurrence within 10 years of 9% if the patient's only systemic therapy is tamoxifen for 5 years. Also predicts no benefit from chemotherapy.  (4) radiation therapy scheduled to start 09/08/2013  (5) antiestrogen therapy to start late April. Likely it will be Arimidex. 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 He Oncotype score no early predicts a low risk of recurrence outside of the breast assuming she takes tamoxifen for 5 years, and also predicts no benefit from chemotherapy. Accordingly she is already being evaluated by radiation oncology and scheduled to start radiation next week.  [place] is very concerned about her weight gain. We discussed this today and she understands the equation regarding weight change is fairly Bruel, namely calories in and minus calories out. She is already starting an exercise program, which is wonderful, and walks about 30 minutes 4-5 times a week. She has not injured herself so far, which again is a big plus.  The other part of the equation is decreasing calories. I have suggested she eliminated bread, pastor, rise, and potatoes. She can eat vegetables and needed an occasional fruit. If she actually does that she will definitely lose weight and her goal should be a pound a month which means that in 4 years she could lose 50 pounds, and keep it off. We'll so discuss the difference between fitness and being overweight. She has a good understanding of all this and appears very motivated.  She is going to see me in late April. We will obtain a DEXA scan prior to that visit. We will likely start anastrozole at that time. She knows to call for any problems that may develop before then.  Chauncey Cruel, MD   09/03/2013 4:22 PM

## 2013-09-04 ENCOUNTER — Telehealth: Payer: Self-pay | Admitting: *Deleted

## 2013-09-04 ENCOUNTER — Encounter: Payer: Self-pay | Admitting: Radiation Oncology

## 2013-09-04 NOTE — Progress Notes (Signed)
  Radiation Oncology         (336) [phone] ________________________________  Name: Alyssa [name] MRN: [id number]  Date: 09/04/2013  DOB: 1950/08/28  Optical Surface Tracking Plan:  Since intensity modulated radiotherapy (IMRT) and 3D conformal radiation treatment methods are predicated on accurate and precise positioning for treatment, intrafraction motion monitoring is medically necessary to ensure accurate and safe treatment delivery.  The ability to quantify intrafraction motion without excessive ionizing radiation dose can only be performed with optical surface tracking. Accordingly, surface imaging offers the opportunity to obtain 3D measurements of patient position throughout IMRT and 3D treatments without excessive radiation exposure.  I am ordering optical surface tracking for this patient's upcoming course of radiotherapy. ________________________________  Rexene Edison, MD 09/04/2013 1:49 PM    Reference:   Ursula Alert, J, et al. Surface imaging-based analysis of intrafraction motion for breast radiotherapy patients.Journal of [place], n. 6, nov. 2014. ISSN [id number].   Available at: <http://www.jacmp.org/index.php/jacmp/article/view/4957>.

## 2013-09-04 NOTE — Progress Notes (Signed)
  Radiation Oncology         (336) [phone] ________________________________  Name: Alyssa [name] MRN: [id number]  Date: 09/04/2013  DOB: 1951-07-11  RESPIRATORY MOTION MANAGEMENT SIMULATION  NARRATIVE:  In order to account for effect of respiratory motion on target structures and other organs in the planning and delivery of radiotherapy, this patient underwent respiratory motion management simulation.  To accomplish this, when the patient was brought to the CT simulation planning suite, 4D respiratoy motion management CT images were obtained.  The CT images were loaded into the planning software.  Then, using a variety of tools including Cine, MIP, and standard views, the target volume and planning target volumes (PTV) were delineated.  Avoidance structures were contoured.  Treatment planning then occurred.  Dose volume histograms were generated and reviewed for each of the requested structure.  The resulting plan was carefully reviewed and approved today.  3-D simulation note: The patient completed 3-D simulation for treatment to her left breast. She is being treated with deep inspiration and breath-hold along with optical guidance. She is setup to tangential left breast tangents. 2 sets of multileaf collimators were designed to conform the field. Dose volume histograms were obtained for the lungs and heart. We met our departmental guidelines. I prescribing 5000 cGy in 25 sessions utilizing 10 MV photons.

## 2013-09-04 NOTE — Telephone Encounter (Signed)
Lm gv appt for bon dex for 10/27/13@ 10am, and appt for [facility] on 11/19/13 @ 2:30pm. Made the pt aware that i will mail a letter/avs..td

## 2013-09-05 ENCOUNTER — Other Ambulatory Visit: Payer: BC Managed Care – PPO

## 2013-09-08 ENCOUNTER — Ambulatory Visit
Admission: RE | Admit: 2013-09-08 | Discharge: 2013-09-08 | Disposition: A | Discharge status: Home / Self Care | Payer: BC Managed Care – PPO | Source: Ambulatory Visit | Attending: Radiation Oncology | Admitting: Radiation Oncology

## 2013-09-08 DIAGNOSIS — C50212 Malignant neoplasm of upper-inner quadrant of left female breast: Secondary | ICD-10-CM

## 2013-09-08 NOTE — Progress Notes (Signed)
Simulation verification note: The patient underwent simulation verification for treatment to her left breast. Her isocenter is in good position and the multileaf collimators contoured the treatment volume appropriately.

## 2013-09-09 ENCOUNTER — Ambulatory Visit
Admission: RE | Admit: 2013-09-09 | Discharge: 2013-09-09 | Disposition: A | Discharge status: Home / Self Care | Payer: BC Managed Care – PPO | Source: Ambulatory Visit | Attending: Radiation Oncology | Admitting: Radiation Oncology

## 2013-09-09 DIAGNOSIS — C50212 Malignant neoplasm of upper-inner quadrant of left female breast: Secondary | ICD-10-CM

## 2013-09-09 MED ORDER — ALRA NON-METALLIC DEODORANT (RAD-ONC)
1.0000 "application " | Freq: Once | TOPICAL | Status: AC
  Administered 2013-09-09: 1 via TOPICAL

## 2013-09-09 MED ORDER — RADIAPLEXRX EX GEL
Freq: Once | CUTANEOUS | Status: AC
  Administered 2013-09-09: 16:00:00 via TOPICAL

## 2013-09-09 NOTE — Progress Notes (Signed)
Post sim ed completed w/pt. Gave pt "Radiation and You" booklet w/all pertinent information marked and discussed, re: fatigue, skin irritation/care, nutrition, pain. Gave pt Alra, Radiaplex w/instructions for proper use. Pt verbalized understanding.  Pt stated she would read information and ask questions next Monday on PUT day if she had any.

## 2013-09-10 ENCOUNTER — Ambulatory Visit
Admission: RE | Admit: 2013-09-10 | Discharge: 2013-09-10 | Disposition: A | Discharge status: Home / Self Care | Payer: BC Managed Care – PPO | Source: Ambulatory Visit | Attending: Radiation Oncology | Admitting: Radiation Oncology

## 2013-09-11 ENCOUNTER — Ambulatory Visit
Admission: RE | Admit: 2013-09-11 | Discharge: 2013-09-11 | Disposition: A | Discharge status: Home / Self Care | Payer: BC Managed Care – PPO | Source: Ambulatory Visit | Attending: Radiation Oncology | Admitting: Radiation Oncology

## 2013-09-12 ENCOUNTER — Ambulatory Visit
Admission: RE | Admit: 2013-09-12 | Discharge: 2013-09-12 | Disposition: A | Discharge status: Home / Self Care | Payer: BC Managed Care – PPO | Source: Ambulatory Visit | Attending: Radiation Oncology | Admitting: Radiation Oncology

## 2013-09-15 ENCOUNTER — Ambulatory Visit
Admission: RE | Admit: 2013-09-15 | Discharge: 2013-09-15 | Disposition: A | Discharge status: Home / Self Care | Payer: BC Managed Care – PPO | Source: Ambulatory Visit | Attending: Radiation Oncology | Admitting: Radiation Oncology

## 2013-09-15 VITALS — BP 132/70 | HR 76 | Temp 98.3°F | Wt 221.1 lb

## 2013-09-15 DIAGNOSIS — C50212 Malignant neoplasm of upper-inner quadrant of left female breast: Secondary | ICD-10-CM

## 2013-09-15 NOTE — Progress Notes (Signed)
Patient here for routine weekly assessment of radiation to left breat.Completed 5 of 25  Treatments.No skin changes.Has generalized fatigue.

## 2013-09-15 NOTE — Progress Notes (Signed)
Weekly Management Note:  Site: Left breast Current Dose:  1000  cGy Projected Dose:  5000 cGy, no boost  Narrative: The patient is seen today for routine under treatment assessment. CBCT/MVCT images/port films were reviewed. The chart was reviewed.   She is without complaints today. She has not yet started Radioplex gel.  Physical Examination:  Filed Vitals:   09/15/13 1707  BP: 132/70  Pulse: 76  Temp: 98.3 F (36.8 C)  .  Weight: 221 lb 1.6 oz (100.29 kg). No significant skin changes.  Impression: Tolerating radiation therapy well.  Plan: Continue radiation therapy as planned.

## 2013-09-16 ENCOUNTER — Ambulatory Visit
Admission: RE | Admit: 2013-09-16 | Discharge: 2013-09-16 | Disposition: A | Discharge status: Home / Self Care | Payer: BC Managed Care – PPO | Source: Ambulatory Visit | Attending: Radiation Oncology | Admitting: Radiation Oncology

## 2013-09-17 ENCOUNTER — Ambulatory Visit
Admission: RE | Admit: 2013-09-17 | Discharge: 2013-09-17 | Disposition: A | Discharge status: Home / Self Care | Payer: BC Managed Care – PPO | Source: Ambulatory Visit | Attending: Radiation Oncology | Admitting: Radiation Oncology

## 2013-09-18 ENCOUNTER — Ambulatory Visit
Admission: RE | Admit: 2013-09-18 | Discharge: 2013-09-18 | Disposition: A | Discharge status: Home / Self Care | Payer: BC Managed Care – PPO | Source: Ambulatory Visit | Attending: Radiation Oncology | Admitting: Radiation Oncology

## 2013-09-19 ENCOUNTER — Ambulatory Visit
Admission: RE | Admit: 2013-09-19 | Discharge: 2013-09-19 | Disposition: A | Discharge status: Home / Self Care | Payer: BC Managed Care – PPO | Source: Ambulatory Visit | Attending: Radiation Oncology | Admitting: Radiation Oncology

## 2013-09-22 ENCOUNTER — Ambulatory Visit
Admission: RE | Admit: 2013-09-22 | Discharge: 2013-09-22 | Disposition: A | Discharge status: Home / Self Care | Payer: BC Managed Care – PPO | Source: Ambulatory Visit | Attending: Radiation Oncology | Admitting: Radiation Oncology

## 2013-09-22 VITALS — BP 134/71 | HR 76 | Temp 97.7°F | Wt 223.4 lb

## 2013-09-22 DIAGNOSIS — C50212 Malignant neoplasm of upper-inner quadrant of left female breast: Secondary | ICD-10-CM

## 2013-09-22 NOTE — Progress Notes (Signed)
Weekly Management Note:   Site: Left breast Current Dose:  2000  cGy Projected Dose: 5000  cGy, no boost  Narrative: The patient is seen today for routine under treatment assessment. CBCT/MVCT images/port films were reviewed. The chart was reviewed.   She is without complaints today. She uses Radioplex gel.  Physical Examination:  Filed Vitals:   09/22/13 1625  BP: 134/71  Pulse: 76  Temp: 97.7 F (36.5 C)  .  Weight: 223 lb 6.4 oz (101.334 kg). There is mild erythema along the left breast along the inframammary region and lower axilla. No areas of desquamation.  Impression: Tolerating radiation therapy well.  Plan: Continue radiation therapy as planned.

## 2013-09-22 NOTE — Progress Notes (Signed)
Patient for weekly assessment of radiation to left breast.Completed 10 of 25 treatments.Denies pain.Skin pink , patient states it gets pinker as the day goes on and then is clear by morning.Generalized fatigue improved with daily naps.

## 2013-09-23 ENCOUNTER — Ambulatory Visit
Admission: RE | Admit: 2013-09-23 | Discharge: 2013-09-23 | Disposition: A | Discharge status: Home / Self Care | Payer: BC Managed Care – PPO | Source: Ambulatory Visit | Attending: Radiation Oncology | Admitting: Radiation Oncology

## 2013-09-24 ENCOUNTER — Ambulatory Visit
Admission: RE | Admit: 2013-09-24 | Discharge: 2013-09-24 | Disposition: A | Discharge status: Home / Self Care | Payer: BC Managed Care – PPO | Source: Ambulatory Visit | Attending: Radiation Oncology | Admitting: Radiation Oncology

## 2013-09-25 ENCOUNTER — Ambulatory Visit
Admission: RE | Admit: 2013-09-25 | Discharge: 2013-09-25 | Disposition: A | Discharge status: Home / Self Care | Payer: BC Managed Care – PPO | Source: Ambulatory Visit | Attending: Radiation Oncology | Admitting: Radiation Oncology

## 2013-09-26 ENCOUNTER — Ambulatory Visit
Admission: RE | Admit: 2013-09-26 | Discharge: 2013-09-26 | Disposition: A | Discharge status: Home / Self Care | Payer: BC Managed Care – PPO | Source: Ambulatory Visit | Attending: Radiation Oncology | Admitting: Radiation Oncology

## 2013-09-29 ENCOUNTER — Ambulatory Visit
Admission: RE | Admit: 2013-09-29 | Discharge: 2013-09-29 | Disposition: A | Discharge status: Home / Self Care | Payer: BC Managed Care – PPO | Source: Ambulatory Visit | Attending: Radiation Oncology | Admitting: Radiation Oncology

## 2013-09-29 ENCOUNTER — Encounter: Payer: Self-pay | Admitting: Radiation Oncology

## 2013-09-29 VITALS — BP 130/74 | HR 71 | Temp 97.6°F | Resp 20 | Wt 224.1 lb

## 2013-09-29 DIAGNOSIS — C50212 Malignant neoplasm of upper-inner quadrant of left female breast: Secondary | ICD-10-CM

## 2013-09-29 NOTE — Progress Notes (Signed)
Weekly rad txs, left breast 15/25 completed, mild erythema, skin intact,using radiaplex once daily, no c/o pain, appetite good, energy level okay stated 4:24 PM

## 2013-09-29 NOTE — Progress Notes (Signed)
Weekly Management Note:  Site: Left breast Current Dose:  3000  cGy Projected Dose: 5000  cGy, no boost  Narrative: The patient is seen today for routine under treatment assessment. CBCT/MVCT images/port films were reviewed. The chart was reviewed.   She is without complaints today. She uses Radioplex gel when necessary.  Physical Examination:  Filed Vitals:   09/29/13 1624  BP: 130/74  Pulse: 71  Temp: 97.6 F (36.4 C)  Resp: 20  .  Weight: 224 lb 1.6 oz (101.651 kg). There is faint/mild erythema/hyperpigmentation the skin with no areas of desquamation.  Impression: Tolerating radiation therapy well.  Plan: Continue radiation therapy as planned.

## 2013-09-30 ENCOUNTER — Ambulatory Visit
Admission: RE | Admit: 2013-09-30 | Discharge: 2013-09-30 | Disposition: A | Discharge status: Home / Self Care | Payer: BC Managed Care – PPO | Source: Ambulatory Visit | Attending: Radiation Oncology | Admitting: Radiation Oncology

## 2013-10-01 ENCOUNTER — Ambulatory Visit
Admission: RE | Admit: 2013-10-01 | Discharge: 2013-10-01 | Disposition: A | Discharge status: Home / Self Care | Payer: BC Managed Care – PPO | Source: Ambulatory Visit | Attending: Radiation Oncology | Admitting: Radiation Oncology

## 2013-10-02 ENCOUNTER — Ambulatory Visit
Admission: RE | Admit: 2013-10-02 | Discharge: 2013-10-02 | Disposition: A | Discharge status: Home / Self Care | Payer: BC Managed Care – PPO | Source: Ambulatory Visit | Attending: Radiation Oncology | Admitting: Radiation Oncology

## 2013-10-03 ENCOUNTER — Ambulatory Visit
Admission: RE | Admit: 2013-10-03 | Discharge: 2013-10-03 | Disposition: A | Discharge status: Home / Self Care | Payer: BC Managed Care – PPO | Source: Ambulatory Visit | Attending: Radiation Oncology | Admitting: Radiation Oncology

## 2013-10-06 ENCOUNTER — Ambulatory Visit
Admission: RE | Admit: 2013-10-06 | Discharge: 2013-10-06 | Disposition: A | Discharge status: Home / Self Care | Payer: BC Managed Care – PPO | Source: Ambulatory Visit | Attending: Radiation Oncology | Admitting: Radiation Oncology

## 2013-10-06 VITALS — BP 142/75 | HR 72 | Temp 98.4°F | Ht 62.5 in | Wt 222.3 lb

## 2013-10-06 DIAGNOSIS — C50212 Malignant neoplasm of upper-inner quadrant of left female breast: Secondary | ICD-10-CM

## 2013-10-06 NOTE — Progress Notes (Signed)
Weekly Management Note:  Site: Left breast Current Dose:  4000  cGy Projected Dose: 5000  CGy, no boost  Narrative: The patient is seen today for routine under treatment assessment. CBCT/MVCT images/port films were reviewed. The chart was reviewed.   She is without complaints today except for mild pruritus along the upper-outer quadrant of the left breast. She uses Radioplex gel.  Physical Examination:  Filed Vitals:   10/06/13 1637  BP: 142/75  Pulse: 72  Temp: 98.4 F (36.9 C)  .  Weight: 222 lb 4.8 oz (100.835 kg). There is moderate erythema the skin along left breast with a papular eruption along the upper inner quadrant. There is no desquamation.  Impression: Tolerating radiation therapy well, although she does have a papular radiation dermatitis along the upper inner quadrant of the left breast along sun exposed skin.  Plan: Continue radiation therapy as planned.

## 2013-10-06 NOTE — Progress Notes (Signed)
Alyssa [name] has had 20 fractions to her left breast.  She denies pain except for occasional sharp pains in her left breast.  The skin on the upper part of her left breast has a raised, red rash.  The rest of her left breast and underarm is pink.  She has noticed some swelling/firmness in her upper breast and underarm.  She has not been using radiaplex.  She reports fatigue.

## 2013-10-07 ENCOUNTER — Ambulatory Visit
Admission: RE | Admit: 2013-10-07 | Discharge: 2013-10-07 | Disposition: A | Discharge status: Home / Self Care | Payer: BC Managed Care – PPO | Source: Ambulatory Visit | Attending: Radiation Oncology | Admitting: Radiation Oncology

## 2013-10-08 ENCOUNTER — Ambulatory Visit
Admission: RE | Admit: 2013-10-08 | Discharge: 2013-10-08 | Disposition: A | Discharge status: Home / Self Care | Payer: BC Managed Care – PPO | Source: Ambulatory Visit | Attending: Radiation Oncology | Admitting: Radiation Oncology

## 2013-10-09 ENCOUNTER — Ambulatory Visit
Admission: RE | Admit: 2013-10-09 | Discharge: 2013-10-09 | Disposition: A | Discharge status: Home / Self Care | Payer: BC Managed Care – PPO | Source: Ambulatory Visit | Attending: Radiation Oncology | Admitting: Radiation Oncology

## 2013-10-10 ENCOUNTER — Ambulatory Visit
Admission: RE | Admit: 2013-10-10 | Discharge: 2013-10-10 | Disposition: A | Discharge status: Home / Self Care | Payer: BC Managed Care – PPO | Source: Ambulatory Visit | Attending: Radiation Oncology | Admitting: Radiation Oncology

## 2013-10-13 ENCOUNTER — Ambulatory Visit
Admission: RE | Admit: 2013-10-13 | Discharge: 2013-10-13 | Disposition: A | Discharge status: Home / Self Care | Payer: BC Managed Care – PPO | Source: Ambulatory Visit | Attending: Radiation Oncology | Admitting: Radiation Oncology

## 2013-10-13 VITALS — BP 134/81 | HR 60 | Temp 97.8°F | Ht 62.5 in | Wt 223.2 lb

## 2013-10-13 DIAGNOSIS — C50212 Malignant neoplasm of upper-inner quadrant of left female breast: Secondary | ICD-10-CM

## 2013-10-13 NOTE — Progress Notes (Signed)
Weekly Management Note:  Site: Left breast Current Dose:  5000  cGy Projected Dose: 5000  cGy  Narrative: The patient is seen today for routine under treatment assessment. CBCT/MVCT images/port films were reviewed. The chart was reviewed.   She is without complaints today. She finished her radiation therapy today. She uses Radioplex gel.  Physical Examination:  Filed Vitals:   10/13/13 1632  BP: 134/81  Pulse: 60  Temp: 97.8 F (36.6 C)  .  Weight: 223 lb 3.2 oz (101.243 kg). There is moderate erythema and hyperpigmentation the skin along the left breast with patchy dry desquamation along the lower axilla and inframammary region as expected.  Impression: Radiation therapy completed.  Plan: Followup visit in one month.

## 2013-10-13 NOTE — Progress Notes (Signed)
Alyssa [name] has had 25 fractions to her left breast.  She denies pain.  She reports fatigue.  The skin on her left breast is red with a scattered, rash on the top of her breast.  She said it is itchy in the rash area.  She is using hydrocortisone cream on the rash area.  She has not been using radiaplex gel.

## 2013-10-14 ENCOUNTER — Other Ambulatory Visit: Payer: Self-pay

## 2013-10-14 NOTE — Telephone Encounter (Signed)
error 

## 2013-10-15 ENCOUNTER — Encounter: Payer: Self-pay | Admitting: Radiation Oncology

## 2013-10-15 NOTE — Progress Notes (Signed)
[facility] End of Treatment Note  Name:Alyssa [name]  Date: 10/15/2013 MYT:[id number] DOB:12-Dec-1950   Status:outpatient    CC: Lynne Logan, MD  Dr. Rolm Bookbinder  REFERRING PHYSICIAN:  Dr. Rolm Bookbinder   DIAGNOSIS:  Stage I (T1, N0, M0) invasive ductal/DCIS of the left  INDICATION FOR TREATMENT: Curative   TREATMENT DATES: 09/09/2013 through 10/13/2013                          SITE/DOSE: Left breast:   5000 cGy in 25 sessions, no boost                   BEAMS/ENERGY:  Tangential fields with deep inspiration and breath-hold, 10 MV photons                 NARRATIVE: [name] tolerated treatment well with only patchy dry desquamation the skin by completion of therapy. She uses Radioplex gel during her course of treatment.                           PLAN: Routine followup in one month. Patient instructed to call if questions or worsening complaints in interim. She'll be considered for adjuvant antiestrogen therapy through Dr. Jana Hakim. She has a followup visit with him on April 29.

## 2013-10-24 ENCOUNTER — Telehealth: Payer: Self-pay | Admitting: *Deleted

## 2013-10-24 NOTE — Telephone Encounter (Signed)
Mailed after appt letter to pt. 

## 2013-10-27 ENCOUNTER — Other Ambulatory Visit: Payer: BC Managed Care – PPO

## 2013-10-29 ENCOUNTER — Ambulatory Visit
Admission: RE | Admit: 2013-10-29 | Discharge: 2013-10-29 | Disposition: A | Discharge status: Home / Self Care | Payer: BC Managed Care – PPO | Source: Ambulatory Visit | Attending: Oncology | Admitting: Oncology

## 2013-10-29 DIAGNOSIS — C50919 Malignant neoplasm of unspecified site of unspecified female breast: Secondary | ICD-10-CM

## 2013-11-14 ENCOUNTER — Telehealth: Payer: Self-pay | Admitting: *Deleted

## 2013-11-14 NOTE — Telephone Encounter (Signed)
Called and left a message for the pt to return my call so I can reschedule her appt.

## 2013-11-17 ENCOUNTER — Telehealth: Payer: Self-pay | Admitting: *Deleted

## 2013-11-17 NOTE — Telephone Encounter (Signed)
Pt returned my call and if fine with seeing Dr. Raliegh Ip.  Confirmed 11/19/13 appt w/ pt.  Changed providers.

## 2013-11-19 ENCOUNTER — Ambulatory Visit (HOSPITAL_BASED_OUTPATIENT_CLINIC_OR_DEPARTMENT_OTHER): Payer: BC Managed Care – PPO | Admitting: Hematology and Oncology

## 2013-11-19 ENCOUNTER — Telehealth: Payer: Self-pay | Admitting: Oncology

## 2013-11-19 ENCOUNTER — Ambulatory Visit (HOSPITAL_BASED_OUTPATIENT_CLINIC_OR_DEPARTMENT_OTHER): Payer: BC Managed Care – PPO

## 2013-11-19 ENCOUNTER — Other Ambulatory Visit: Payer: Self-pay | Admitting: *Deleted

## 2013-11-19 VITALS — BP 130/80 | HR 75 | Temp 97.7°F | Resp 20 | Ht 62.5 in | Wt 223.0 lb

## 2013-11-19 DIAGNOSIS — C50219 Malignant neoplasm of upper-inner quadrant of unspecified female breast: Secondary | ICD-10-CM

## 2013-11-19 DIAGNOSIS — R5383 Other fatigue: Secondary | ICD-10-CM

## 2013-11-19 DIAGNOSIS — C50212 Malignant neoplasm of upper-inner quadrant of left female breast: Secondary | ICD-10-CM

## 2013-11-19 DIAGNOSIS — E079 Disorder of thyroid, unspecified: Secondary | ICD-10-CM

## 2013-11-19 DIAGNOSIS — R5381 Other malaise: Secondary | ICD-10-CM

## 2013-11-19 DIAGNOSIS — Z170 Estrogen receptor positive status [ER+]: Secondary | ICD-10-CM

## 2013-11-19 DIAGNOSIS — M899 Disorder of bone, unspecified: Secondary | ICD-10-CM

## 2013-11-19 DIAGNOSIS — Z86711 Personal history of pulmonary embolism: Secondary | ICD-10-CM

## 2013-11-19 DIAGNOSIS — M949 Disorder of cartilage, unspecified: Secondary | ICD-10-CM

## 2013-11-19 LAB — CBC WITH DIFFERENTIAL/PLATELET
BASO%: 1 % (ref 0.0–2.0)
BASOS ABS: 0 10*3/uL (ref 0.0–0.1)
EOS%: 2.4 % (ref 0.0–7.0)
Eosinophils Absolute: 0.1 10*3/uL (ref 0.0–0.5)
HEMATOCRIT: 40.5 % (ref 34.8–46.6)
HEMOGLOBIN: 13.4 g/dL (ref 11.6–15.9)
LYMPH%: 30.6 % (ref 14.0–49.7)
MCH: 28.7 pg (ref 25.1–34.0)
MCHC: 33.1 g/dL (ref 31.5–36.0)
MCV: 86.7 fL (ref 79.5–101.0)
MONO#: 0.4 10*3/uL (ref 0.1–0.9)
MONO%: 12.3 % (ref 0.0–14.0)
NEUT#: 2 10*3/uL (ref 1.5–6.5)
NEUT%: 53.7 % (ref 38.4–76.8)
PLATELETS: 269 10*3/uL (ref 145–400)
RBC: 4.67 10*6/uL (ref 3.70–5.45)
RDW: 14.6 % — ABNORMAL HIGH (ref 11.2–14.5)
WBC: 3.6 10*3/uL — ABNORMAL LOW (ref 3.9–10.3)
lymph#: 1.1 10*3/uL (ref 0.9–3.3)

## 2013-11-19 LAB — COMPREHENSIVE METABOLIC PANEL (CC13)
ALK PHOS: 71 U/L (ref 40–150)
ALT: 66 U/L — ABNORMAL HIGH (ref 0–55)
AST: 41 U/L — AB (ref 5–34)
Albumin: 3.9 g/dL (ref 3.5–5.0)
Anion Gap: 12 mEq/L — ABNORMAL HIGH (ref 3–11)
BILIRUBIN TOTAL: 0.54 mg/dL (ref 0.20–1.20)
BUN: 23.3 mg/dL (ref 7.0–26.0)
CALCIUM: 9.9 mg/dL (ref 8.4–10.4)
CHLORIDE: 101 meq/L (ref 98–109)
CO2: 31 mEq/L — ABNORMAL HIGH (ref 22–29)
CREATININE: 1.1 mg/dL (ref 0.6–1.1)
Glucose: 107 mg/dl (ref 70–140)
Potassium: 3.5 mEq/L (ref 3.5–5.1)
Sodium: 144 mEq/L (ref 136–145)
Total Protein: 7.5 g/dL (ref 6.4–8.3)

## 2013-11-19 MED ORDER — LETROZOLE 2.5 MG PO TABS: 2.5000 mg | ORAL_TABLET | Freq: Every day | ORAL | Status: DC

## 2013-11-19 NOTE — Telephone Encounter (Signed)
, °

## 2013-11-19 NOTE — Progress Notes (Signed)
[place]  [phone] [phone]     ID: Alyssa [name] OB: [birth year]  MR#: [id number]  ZHY#:[id number]  PCP: Lynne Logan, MD GYN:   SU: Rolm Bookbinder OTHER MD: Arloa Koh, Antony Contras  CHIEF COMPLAINT: Follow up visit for breast cancer  HISTORY OF PRESENT ILLNESS: As per previously dictated note: Alyssa [name] had bilateral screening mammography at the [facility] 07/01/2013 and is suggested a possible mass in the left breast. Left diagnostic mammography and ultrasonography 07/21/2013 showed an irregular high-density mass in the upper inner quadrant of the left breast which was palpable at the 11:00 position 5 cm from the nipple. Ultrasound confirmed an irregular hypoechoic mass measuring 1.1 cm. The left axilla was unremarkable.  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  The patient's subsequent history is as detailed below.  INTERVAL HISTORY: Alyssa [name] returns today for followup of her breast cancer after radiation therapy which she completed on 10/13/2013. She tolerated radiation therapy very well. She says that she needs to lose weight. she gained 20 pounds over the past 6 months. She denies any shortness of breath, chest pain, palpations, headaches, blood in the stool or blood in the urine. At home she is only on  vitamin D supplementation without  Calcium. She does not complain of any bony pains  REVIEW OF SYSTEMS: A 10 point review of systems is been assessed and pertinent symptoms were mentioned in interval history    PAST MEDICAL HISTORY: Past Medical History  Diagnosis Date  . Hyperlipidemia   . Thyroid disease   . Abdominal pain   . Post concussion syndrome   . Ventral hernia   . Hypertension      PCP DR Nolon Rod  . Recurrent upper respiratory infection (URI)     FLU 08/23/11-08/29/11 then URI 08/29/11- 09/08/11- S/P zpack and steroids-  improved      no cough or congestion  . Hypothyroidism   . Post concussion syndrome     4 yrs ago- sees [facility]- Dr Leonie Man every 6 months-   has sleep study 2011 following accident but was neg per patient-  short term memory issues, dates and times  . Headache(784.0)   . Hernia, incisional periumbilical, incarcerated 10/14/4008  . Thyroid mass, left inferior lobe, 3.6cm [id number] 10/07/2011  . Pulmonary embolism   . Hot flashes   . Peripheral vascular disease     pulmonary embolis-still have some  . Cancer     breast  . Breast cancer 06/2013    left upper inner    left thyroid mass biopsy 11/21/2011 showed lymphocytic thyroiditis, no malignancy  PAST SURGICAL HISTORY: Past Surgical History  Procedure Laterality Date  . Cholecystectomy  2006    lap chole, Dr. Bubba Camp  . Appendectomy  2002    lap appy.  Dr. Hassell Done  . Carpel tunnel      left in April 2011, right in June 2012  . Tarsal tunnel release      bilateral  . Cesarean section      x2  . Hernia repair  09/15/11    Ventral w/mesh  . Ventral hernia repair  09/29/2011    Procedure: LAPAROSCOPIC VENTRAL HERNIA;  Surgeon: Adin Hector, MD;  Location: [facility];  Service: General;  Laterality: N/A;  Laparoscopic Ventral Wall Hernia Repair with Mesh  . Breast lumpectomy with needle localization and axillary sentinel lymph node bx Left 08/04/2013    Procedure: BREAST LUMPECTOMY WITH NEEDLE LOCALIZATION AND AXILLARY SENTINEL LYMPH NODE Biopsy x 3;  Surgeon: Rolm Bookbinder, MD;  Location: [facility];  Service: General;  Laterality: Left;    FAMILY HISTORY Family History  Problem Relation Age of Onset  . Malignant hyperthermia Mother   . Heart attack Father   . Stroke Father   . Diabetes      Grandmother  . Other      respiratory- Grandmother  . Cancer Maternal Grandmother 73    breast    patient's father died at the age of 29 from a myocardial infarction. The patient's mother, Alyssa [name], is alive at 54. She lives with the patient, and is very independent (drives, etc.) The patient has 2 brothers, no sisters. The patient's mother is mother was diagnosed with breast cancer at the age of 12. There is no history of breast or ovarian cancer in the family (there is a history of cervical cancer in the patient's daughter).  GYNECOLOGIC HISTORY:  Menarche age 67, first live birth age 2, the patient is [place] P2. She stopped having periods approximately 1999 and took hormone replacements until 2005.  SOCIAL HISTORY:  Adriannah works as a Estate agent. She is divorced and lives with her mother Alyssa [name]. Daughter Alyssa [name] is a business woman living in [place]. Son Alyssa [name] lives in [place], also in business. The patient has 2 grandchildren. She attends [employer]    ADVANCED DIRECTIVES: Not in place   HEALTH MAINTENANCE: History  Substance Use Topics  . Smoking status: Never Smoker   . Smokeless tobacco: Never Used  . Alcohol Use: No     Colonoscopy: Never  PAP:  Bone density: Never  Lipid panel:  Allergies  Allergen Reactions  . Codeine Other (See Comments)    hallucinations  . Other Swelling    Swelling of  Eyes   Wheat ,oranges banana, zucchini,cayenne,chili  Peppers, sweet potatoes, pumpkin  . Penicillins Hives, Swelling and Rash    All over the body.  Marland Kitchen Percocet [Oxycodone-Acetaminophen] Other (See Comments)    Causes syncope day after medication has been taken.  . Vicodin [Hydrocodone-Acetaminophen] Other (See Comments)    Causes syncope day after medication has been taken.    Current Outpatient Prescriptions  Medication Sig Dispense Refill  . ALPRAZolam (XANAX) 0.5 MG tablet Take 0.5 mg by mouth at bedtime as needed.       . cetirizine (ZYRTEC) 10 MG tablet Take 10 mg by mouth every evening.       . divalproex (DEPAKOTE) 500  MG DR tablet Take 1 tablet (500 mg total) by mouth at bedtime.  30 tablet  11  . hydrochlorothiazide (HYDRODIURIL) 25 MG tablet Take 25 mg by mouth at bedtime.       Marland Kitchen thyroid (ARMOUR) 65 MG tablet Take 65 mg by mouth 2 (two) times daily.      . traZODone (DESYREL) 50 MG tablet Take 50 mg by mouth at bedtime.      Marland Kitchen VITAMIN D, CHOLECALCIFEROL, PO Take 1 capsule by mouth. Vitamin D 15, 000 units with Vitamin K      . letrozole (FEMARA) 2.5 MG tablet Take 1 tablet (2.5 mg total) by mouth daily.  30 tablet  3   No current facility-administered medications for this visit.    OBJECTIVE: Middle-aged white  woman who appears stated age 63 Vitals:   11/19/13 1428  BP: 130/80  Pulse: 75  Temp: 97.7 F (36.5 C)  Resp: 20     Body mass index is 40.11 kg/(m^2).    ECOG FS:1 - Symptomatic but completely ambulatory HEENT PERRLA, conjunctiva no pallor, neck supple, no JVD, no thyromegaly, sclerae anicteric Oropharynx clear and moist No cervical or supraclavicular adenopathy Lungs no rales or rhonchi Heart regular rate and rhythm Abd soft, nontender, positive bowel sounds MSK no focal spinal tenderness, no upper extremity lymphedema Neuro: nonfocal, well oriented, appropriate affect Breasts: The right breast is unremarkable. Left breast is status post recent lumpectomy.  There is no swelling, erythema, dehiscence, or unusual tenderness. I do not palpate any suspicious masses. The left axilla is benign.   LAB RESULTS:  CMP     Component Value Date/Time   NA 144 11/19/2013 1551   NA 142 08/01/2013 1535   K 3.5 11/19/2013 1551   K 4.4 08/01/2013 1535   CL 97 08/01/2013 1535   CO2 31* 11/19/2013 1551   CO2 31 08/01/2013 1535   GLUCOSE 107 11/19/2013 1551   GLUCOSE 103* 08/01/2013 1535   BUN 23.3 11/19/2013 1551   BUN 19 08/01/2013 1535   CREATININE 1.1 11/19/2013 1551   CREATININE 1.00 08/01/2013 1535   CALCIUM 9.9 11/19/2013 1551   CALCIUM 9.3 08/01/2013 1535   PROT 7.5 11/19/2013 1551   PROT 6.4 10/08/2011  0525   ALBUMIN 3.9 11/19/2013 1551   ALBUMIN 2.8* 10/08/2011 0525   AST 41* 11/19/2013 1551   AST 119* 10/08/2011 0525   ALT 66* 11/19/2013 1551   ALT 155* 10/08/2011 0525   ALKPHOS 71 11/19/2013 1551   ALKPHOS 151* 10/08/2011 0525   BILITOT 0.54 11/19/2013 1551   BILITOT 1.0 10/08/2011 0525   GFRNONAA 60* 08/01/2013 1535   GFRAA 69* 08/01/2013 1535    I No results found for this basename: SPEP,  UPEP,   kappa and lambda light chains    Lab Results  Component Value Date   WBC 3.6* 11/19/2013   NEUTROABS 2.0 11/19/2013   HGB 13.4 11/19/2013   HCT 40.5 11/19/2013   MCV 86.7 11/19/2013   PLT 269 11/19/2013      Chemistry      Component Value Date/Time   NA 144 11/19/2013 1551   NA 142 08/01/2013 1535   K 3.5 11/19/2013 1551   K 4.4 08/01/2013 1535   CL 97 08/01/2013 1535   CO2 31* 11/19/2013 1551   CO2 31 08/01/2013 1535   BUN 23.3 11/19/2013 1551   BUN 19 08/01/2013 1535   CREATININE 1.1 11/19/2013 1551   CREATININE 1.00 08/01/2013 1535      Component Value Date/Time   CALCIUM 9.9 11/19/2013 1551   CALCIUM 9.3 08/01/2013 1535   ALKPHOS 71 11/19/2013 1551   ALKPHOS 151* 10/08/2011 0525   AST 41* 11/19/2013 1551   AST 119* 10/08/2011 0525   ALT 66* 11/19/2013 1551   ALT 155* 10/08/2011 0525   BILITOT 0.54 11/19/2013 1551   BILITOT 1.0 10/08/2011 0525       No results found for this basename: LABCA2    No components found with this basename: LABCA125    No results found for this basename: INR,  in the last 168 hours  Urinalysis    Component Value Date/Time   COLORURINE AMBER* 10/07/2011 0501   APPEARANCEUR CLOUDY* 10/07/2011 0501   LABSPEC 1.034* 10/07/2011 0501   PHURINE 5.5 10/07/2011 0501  GLUCOSEU NEGATIVE 10/07/2011 0501   HGBUR NEGATIVE 10/07/2011 0501   BILIRUBINUR SMALL* 10/07/2011 0501   KETONESUR TRACE* 10/07/2011 0501   PROTEINUR NEGATIVE 10/07/2011 0501   UROBILINOGEN 0.2 10/07/2011 0501   NITRITE NEGATIVE 10/07/2011 0501   LEUKOCYTESUR NEGATIVE 10/07/2011 0501    STUDIES: No results  found.  ASSESSMENT: 63 y.o. [place] woman status post left breast biopsy 07/21/2013 for a clinical T1 N0, stage IA invasive ductal carcinoma, grade 1, estrogen and progesterone receptor are both strongly positive, HER-2 not amplified, with an MIB-1 of 5%  (1) History of pulmonary embolism documented by CT angiography 10/07/2011 (9 days after ventral hernia repair); Dopplers 11-2011 showed no lower extremity DVTs; the patient received Coumadin for 10 months. Hypercoagulable  Workup performed in January 2015 showed negative LAC, anti-cardiolipin Abs, negative to borderline beta-2-glycoprotein Abs, AT, protein C and S, prothrombin II gene mutation and Factor V Leiden  (2) status post left lumpectomy and sentinel lymph node sampling 08/04/2013 for a pT1c pN0, stage IA invasive ductal carcinoma, grade 1, with repeat HER-2 again negative.  (3) Oncotype score of 14 predicts a risk of outside the breast recurrence within 10 years of 9% if the patient's only systemic therapy is tamoxifen for 5 years. Also predicts no benefit from chemotherapy.  (4) Adjuvant Radiation therapy  Started on 09/09/2013 completed on 10/13/2013  (5) Adjuvant Aromatase  inhibitor therapy with Letrozole : start date-11/19/2013    PLAN: Ms. Prospero is here for initiation of antiestrogen therapy. She completed her radiation therapy on 10/13/2013. She says she tolerated radiation therapy very well. During the last visit Dr. Griffith Citron discussed in detail her diagnosis and reviewed the antiestrogen therapy benefits and side effects and also discussed the weight loss program with her extensively. She says she gained 20 pounds over the past few months and she understands she needs to lose weight and she states she'll be working on it. In view of her pulmonary embolism she is not a candidate for tamoxifen therapy at this time. I have reiterated  the benefits and side effects of Arimidex inhibitor therapy including myalgias joint pains,  osteopenia,  Osteoporosis and hot flashes and will initiate the patient on Femara 2.20m by mouth once daily. We'll also obtain the baseline CBC differential and CMP today. At present she is  Only on  vitamin D supplementation .it's not clear why she is only on vitamin D without calcium supplementation. We will obtain the intact PTH level today. Recommendations on starting calcium with vitamin D  combined therapy to follow after review of PTH level.  Bone densiometry revealed osteopenia; Next DEXA scan in April 2017   I have asked the patient to call  office next week to go over the lab results including PTH level and for initiation of calcium supplementation.  Next followup visit in 3 months with CBC differential and CMP   She understood the current plan of care and she knows to call for any problems that may develop before then.   PWilmon Arms MD   Medical oncology   11/19/2013 6:23 PM

## 2013-11-20 ENCOUNTER — Other Ambulatory Visit: Payer: Self-pay | Admitting: Hematology and Oncology

## 2013-11-20 ENCOUNTER — Telehealth: Payer: Self-pay | Admitting: *Deleted

## 2013-11-20 DIAGNOSIS — C50919 Malignant neoplasm of unspecified site of unspecified female breast: Secondary | ICD-10-CM

## 2013-11-20 DIAGNOSIS — C50212 Malignant neoplasm of upper-inner quadrant of left female breast: Secondary | ICD-10-CM

## 2013-11-20 LAB — PTH, INTACT AND CALCIUM
CALCIUM: 9.4 mg/dL (ref 8.4–10.5)
PTH: 43.2 pg/mL (ref 14.0–72.0)

## 2013-11-20 MED ORDER — LETROZOLE 2.5 MG PO TABS: 2.5000 mg | ORAL_TABLET | Freq: Every day | ORAL | Status: DC

## 2013-11-20 NOTE — Telephone Encounter (Signed)
Called patient and let her know that Dr. Andria Frames would like to recheck her LFT in one month.  Advised her if she is taking any alcohol that she should stop.  Patient states she has not had any in 2 yrs.  Patient also requested name of drug that Dr. Andria Frames prescribed yesterday.  It is letrozole or femara.  Noticed that pharmacy transmission failed yesterday - will re-escribe this again and have patient check with pharmacy in about an hour.  Escribed failed again.  Script called into pharmacy.

## 2013-11-20 NOTE — Telephone Encounter (Signed)
Message copied by Ignacia Felling on Thu Nov 20, 2013 10:50 AM ------      Message from: Wilmon Arms      Created: Thu Nov 20, 2013  8:52 AM       Pl call pt to come and have blood work for LFT in one month      If she is [place] any alchol tell her to stop ------

## 2013-11-25 ENCOUNTER — Telehealth: Payer: Self-pay | Admitting: *Deleted

## 2013-11-25 ENCOUNTER — Encounter: Payer: Self-pay | Admitting: *Deleted

## 2013-11-25 NOTE — Telephone Encounter (Signed)
Spoke with patient.  Let her know that Dr. Andria Frames wants her to take calcium with Vitamin D twice a day.  Patient states that Dr. Andria Frames had already discussed with her and told her we would call and let her know for sure.  So she appreciated the call and information.

## 2013-11-25 NOTE — Telephone Encounter (Signed)
Message copied by Ignacia Felling on Tue Nov 25, 2013 10:45 AM ------      Message from: Wilmon Arms      Created: Thu Nov 20, 2013  4:00 PM       Pl call pt to take calcium with Vitamin D po twice daily ------

## 2013-12-02 ENCOUNTER — Encounter: Payer: Self-pay | Admitting: Radiation Oncology

## 2013-12-02 ENCOUNTER — Ambulatory Visit
Admission: RE | Admit: 2013-12-02 | Discharge: 2013-12-02 | Disposition: A | Discharge status: Home / Self Care | Payer: BC Managed Care – PPO | Source: Ambulatory Visit | Attending: Radiation Oncology | Admitting: Radiation Oncology

## 2013-12-02 VITALS — BP 142/70 | HR 79 | Temp 98.4°F | Resp 20 | Wt 225.0 lb

## 2013-12-02 DIAGNOSIS — C50212 Malignant neoplasm of upper-inner quadrant of left female breast: Secondary | ICD-10-CM

## 2013-12-02 NOTE — Progress Notes (Signed)
Followup note:  Ms. 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 She is on adjuvant Femara and is bothered by hot flashes and feet swelling. She is not having any arthralgias. She sees Dr. Jana Hakim again in July. She tells me that she is setup for her mammography at the [facility] in December.  Physical examination: Alert and oriented. Filed Vitals:   12/02/13 1456  BP: 142/70  Pulse: 79  Temp: 98.4 F (36.9 C)  Resp: 20   Nodes: Without palpable cervical, supraclavicular, or axillary lymphadenopathy. Chest: Lungs clear. Breasts: There is residual hyperpigmentation the skin along the left breast. There is mild to moderate thickening of the breast/nipple areolar complex. No masses are appreciated. Right breast without masses or lesions. Extremities: Without edema.  Impression: Satisfactory progress. She is having some side effects from her Femara, she will contact Dr. Jana Hakim if her symptoms worsen.  Plan: Followup with Dr. Jana Hakim in July, and followup mammography at the [facility] in December. I've not scheduled the patient for a formal followup visit, but I would more than happy to see her in the future should the need arise.

## 2013-12-02 NOTE — Progress Notes (Addendum)
Pt denies pain, loss of appetite. She is fatigued but states it may be due to Femara. She states since beginning Femara 2 weeks ago she has hot flashes, "feels hot all the time" and has foot swelling. She states her skin of left breast has healed completely from radiation.

## 2013-12-03 ENCOUNTER — Telehealth: Payer: Self-pay

## 2013-12-03 NOTE — Telephone Encounter (Signed)
Per Dr. Earnest Conroy - pt to stop taking letrozole and come in to be seen.   Let pt know to stop taking med and scheduled for Fri 5/15 at 230 pm.  Pt voiced understanding.  Appt entered.

## 2013-12-03 NOTE — Telephone Encounter (Signed)
Pt left msg thinks she is having a reaction to Letrozole.  Swelling in hands and feet, rash on her legs.  LMOVM - Pt to stop letrozole and come in to see Dr. Earnest Conroy 5/14 or 5/15.  Pt to return call to schedule.

## 2013-12-05 ENCOUNTER — Ambulatory Visit (HOSPITAL_BASED_OUTPATIENT_CLINIC_OR_DEPARTMENT_OTHER): Payer: BC Managed Care – PPO | Admitting: Hematology and Oncology

## 2013-12-05 VITALS — BP 143/85 | HR 76 | Temp 97.9°F | Resp 18 | Ht 62.5 in | Wt 223.0 lb

## 2013-12-05 DIAGNOSIS — M949 Disorder of cartilage, unspecified: Secondary | ICD-10-CM

## 2013-12-05 DIAGNOSIS — C50219 Malignant neoplasm of upper-inner quadrant of unspecified female breast: Secondary | ICD-10-CM

## 2013-12-05 DIAGNOSIS — C50212 Malignant neoplasm of upper-inner quadrant of left female breast: Secondary | ICD-10-CM

## 2013-12-05 DIAGNOSIS — R232 Flushing: Secondary | ICD-10-CM

## 2013-12-05 DIAGNOSIS — M899 Disorder of bone, unspecified: Secondary | ICD-10-CM

## 2013-12-05 MED ORDER — ANASTROZOLE 1 MG PO TABS: 1.0000 mg | ORAL_TABLET | Freq: Every day | ORAL | Status: DC

## 2013-12-05 MED ORDER — VENLAFAXINE HCL ER 37.5 MG PO CP24: 37.5000 mg | ORAL_CAPSULE | Freq: Every day | ORAL | Status: DC

## 2013-12-05 NOTE — Progress Notes (Signed)
[facility]  [phone]) [phone] [phone]) [phone]     ID: [name] OB: [birth year]  MR#: [id number]  TKZ#:[id number]  PCP: Lynne Logan, MD GYN:   SU: Rolm Bookbinder OTHER MD: Arloa Koh, Antony Contras  CHIEF COMPLAINT: Skin rash developed on letrozole  HISTORY OF PRESENT ILLNESS: As per previously dictated note: Alyssa [name] had bilateral screening mammography at the [facility] 07/01/2013 and is suggested a possible mass in the left breast. Left diagnostic mammography and ultrasonography 07/21/2013 showed an irregular high-density mass in the upper inner quadrant of the left breast which was palpable at the 11:00 position 5 cm from the nipple. Ultrasound confirmed an irregular hypoechoic mass measuring 1.1 cm. The left axilla was unremarkable.  Biopsy of this mass was obtained the same day and showed ([id number]) an invasive ductal carcinoma, grade 1, estrogen and progesterone receptor both strongly positive by verbal report, with no HER-2 amplification (signals ratio 1.19, number per cell 1.85) and an MIB-1 of 5%.  The patient's subsequent history is as detailed below.  INTERVAL HISTORY: [name] returns today for followup with the complaints of skin rash which started on both lower extremities and also on the chest . She felt like something struck in the throat and she profusely had sweating. She was seen by radiation oncology and was told to stop letrozole. She stopped taking letrozole on Wednesday. She feels much better now and she is here for further management. She also complains of hot flashes while she is on letrozole.    REVIEW OF SYSTEMS: A 10 point review of systems is been assessed and pertinent symptoms were mentioned in interval history    PAST MEDICAL HISTORY: Past Medical History  Diagnosis Date  . Hyperlipidemia   . Thyroid disease   . Abdominal pain   . Post concussion syndrome   . Ventral hernia   . Hypertension     PCP DR  Nolon Rod  . Recurrent upper respiratory infection (URI)     FLU 08/23/11-08/29/11 then URI 08/29/11- 09/08/11- S/P zpack and steroids-  improved      no cough or congestion  . Hypothyroidism   . Post concussion syndrome     4 yrs ago- sees [facility]- Dr Leonie Man every 6 months-   has sleep study 2011 following accident but was neg per patient-  short term memory issues, dates and times  . Headache(784.0)   . Hernia, incisional periumbilical, incarcerated 5/42/7062  . Thyroid mass, left inferior lobe, 3.6cm [id number] 10/07/2011  . Pulmonary embolism   . Hot flashes   . Peripheral vascular disease     pulmonary embolis-still have some  . Cancer     breast  . Breast cancer 06/2013    left upper inner  . 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 Cholecystectomy  2006    lap chole, Dr. Bubba Camp  . Appendectomy  2002    lap appy.  Dr. Hassell Done  . Carpel tunnel      left in April 2011, right in June 2012  . Tarsal tunnel release      bilateral  . Cesarean section      x2  . Hernia repair  09/15/11    Ventral w/mesh  . Ventral hernia repair  09/29/2011    Procedure: LAPAROSCOPIC  VENTRAL HERNIA;  Surgeon: Adin Hector, MD;  Location: [facility];  Service: General;  Laterality: N/A;  Laparoscopic Ventral Wall Hernia Repair with Mesh  . Breast lumpectomy with needle localization and axillary sentinel lymph node bx Left 08/04/2013    Procedure: BREAST LUMPECTOMY WITH NEEDLE LOCALIZATION AND AXILLARY SENTINEL LYMPH NODE Biopsy x 3;  Surgeon: Rolm Bookbinder, MD;  Location: [facility];  Service: General;  Laterality: Left;    FAMILY HISTORY Family History  Problem Relation Age of Onset  . Malignant hyperthermia Mother   . Heart attack Father   . Stroke Father   . Diabetes      Grandmother  .  Other      respiratory- Grandmother  . Cancer Maternal Grandmother 50    breast   patient's father died at the age of 74 from a myocardial infarction. The patient's mother, Alyssa [name], is alive at 62. She lives with the patient, and is very independent (drives, etc.) The patient has 2 brothers, no sisters. The patient's mother is mother was diagnosed with breast cancer at the age of 99. There is no history of breast or ovarian cancer in the family (there is a history of cervical cancer in the patient's daughter).  GYNECOLOGIC HISTORY:  Menarche age 18, first live birth age 63, the patient is [place] P2. She stopped having periods approximately 1999 and took hormone replacements until 2005.  SOCIAL HISTORY:  Akshita works as a Estate agent. She is divorced and lives with her mother Alyssa [name]. Daughter Alyssa [name] is a business woman living in [place]. Son Alyssa [name] lives in [place], also in business. The patient has 2 grandchildren. She attends [employer]    ADVANCED DIRECTIVES: Not in place   HEALTH MAINTENANCE: History  Substance Use Topics  . Smoking status: Never Smoker   . Smokeless tobacco: Never Used  . Alcohol Use: No     Colonoscopy: Never  PAP:  Bone density: Never  Lipid panel:  Allergies  Allergen Reactions  . Aleve [Naproxen Sodium] Hives, Itching and Swelling  . Letrozole Shortness Of Breath, Rash and Other (See Comments)    Swelling - feet, eyes,hands;  Speech garbled  . Codeine Other (See Comments)    hallucinations  . Other Swelling    Swelling of  Eyes   Wheat ,oranges banana, zucchini,cayenne,chili  Peppers, sweet potatoes, pumpkin  . Penicillins Hives, Swelling and Rash    All over the body.  Marland Kitchen Percocet [Oxycodone-Acetaminophen] Other (See Comments)    Causes syncope day after medication has been taken.  . Vicodin [Hydrocodone-Acetaminophen] Other (See Comments)    Causes syncope day after medication has been taken.     Current Outpatient Prescriptions  Medication Sig Dispense Refill  . ALPRAZolam (XANAX) 0.5 MG tablet Take 0.5 mg by mouth at bedtime as needed.       . Calcium Carbonate-Vitamin D (CALTRATE 600+D PO) Take by mouth.      . cetirizine (ZYRTEC) 10 MG tablet Take 10 mg by mouth every evening.       . divalproex (DEPAKOTE) 500 MG DR tablet Take 1 tablet (500 mg total) by mouth at bedtime.  30 tablet  11  . hydrochlorothiazide (HYDRODIURIL) 25 MG tablet Take 25 mg by mouth at bedtime.       Marland Kitchen thyroid (ARMOUR) 65 MG tablet Take 65 mg by mouth 2 (two) times daily.      . traZODone (DESYREL) 50 MG tablet Take 50 mg by mouth at  bedtime.      Marland Kitchen VITAMIN D, CHOLECALCIFEROL, PO Take 1 capsule by mouth. Vitamin D 15, 000 units with Vitamin K      . anastrozole (ARIMIDEX) 1 MG tablet Take 1 tablet (1 mg total) by mouth daily.  30 tablet  3  . venlafaxine XR (EFFEXOR-XR) 37.5 MG 24 hr capsule Take 1 capsule (37.5 mg total) by mouth daily with breakfast.  30 capsule  6   No current facility-administered medications for this visit.    OBJECTIVE: Middle-aged white woman who appears stated age 63 Vitals:   12/05/13 1436  BP: 143/85  Pulse: 76  Temp: 97.9 F (36.6 C)  Resp: 18     Body mass index is 40.11 kg/(m^2).    ECOG FS:1 - Symptomatic but completely ambulatory HEENT PERRLA, conjunctiva no pallor, neck supple, no JVD, no thyromegaly, sclerae anicteric Oropharynx clear and moist No cervical or supraclavicular adenopathy Lungs no rales or rhonchi Heart regular rate and rhythm Abd soft, nontender, positive bowel sounds MSK no focal spinal tenderness, no upper extremity lymphedema Neuro: nonfocal, well oriented, appropriate affect Breasts: The right breast is unremarkable. Left breast is status post recent lumpectomy.  There is no swelling, erythema, dehiscence, or unusual tenderness. I do not palpate any suspicious masses. The left axilla is benign.   LAB RESULTS:  CMP     Component  Value Date/Time   NA 144 11/19/2013 1551   NA 142 08/01/2013 1535   K 3.5 11/19/2013 1551   K 4.4 08/01/2013 1535   CL 97 08/01/2013 1535   CO2 31* 11/19/2013 1551   CO2 31 08/01/2013 1535   GLUCOSE 107 11/19/2013 1551   GLUCOSE 103* 08/01/2013 1535   BUN 23.3 11/19/2013 1551   BUN 19 08/01/2013 1535   CREATININE 1.1 11/19/2013 1551   CREATININE 1.00 08/01/2013 1535   CALCIUM 9.4 11/19/2013 1551   CALCIUM 9.9 11/19/2013 1551   PROT 7.5 11/19/2013 1551   PROT 6.4 10/08/2011 0525   ALBUMIN 3.9 11/19/2013 1551   ALBUMIN 2.8* 10/08/2011 0525   AST 41* 11/19/2013 1551   AST 119* 10/08/2011 0525   ALT 66* 11/19/2013 1551   ALT 155* 10/08/2011 0525   ALKPHOS 71 11/19/2013 1551   ALKPHOS 151* 10/08/2011 0525   BILITOT 0.54 11/19/2013 1551   BILITOT 1.0 10/08/2011 0525   GFRNONAA 60* 08/01/2013 1535   GFRAA 69* 08/01/2013 1535    I No results found for this basename: SPEP,  UPEP,   kappa and lambda light chains    Lab Results  Component Value Date   WBC 3.6* 11/19/2013   NEUTROABS 2.0 11/19/2013   HGB 13.4 11/19/2013   HCT 40.5 11/19/2013   MCV 86.7 11/19/2013   PLT 269 11/19/2013      Chemistry      Component Value Date/Time   NA 144 11/19/2013 1551   NA 142 08/01/2013 1535   K 3.5 11/19/2013 1551   K 4.4 08/01/2013 1535   CL 97 08/01/2013 1535   CO2 31* 11/19/2013 1551   CO2 31 08/01/2013 1535   BUN 23.3 11/19/2013 1551   BUN 19 08/01/2013 1535   CREATININE 1.1 11/19/2013 1551   CREATININE 1.00 08/01/2013 1535      Component Value Date/Time   CALCIUM 9.4 11/19/2013 1551   CALCIUM 9.9 11/19/2013 1551   ALKPHOS 71 11/19/2013 1551   ALKPHOS 151* 10/08/2011 0525   AST 41* 11/19/2013 1551   AST 119* 10/08/2011 0525   ALT 66* 11/19/2013  1551   ALT 155* 10/08/2011 0525   BILITOT 0.54 11/19/2013 1551   BILITOT 1.0 10/08/2011 0525       No results found for this basename: LABCA2    No components found with this basename: LABCA125    No results found for this basename: INR,  in the last 168 hours  Urinalysis     Component Value Date/Time   COLORURINE AMBER* 10/07/2011 0501   APPEARANCEUR CLOUDY* 10/07/2011 0501   LABSPEC 1.034* 10/07/2011 0501   PHURINE 5.5 10/07/2011 0501   GLUCOSEU NEGATIVE 10/07/2011 0501   HGBUR NEGATIVE 10/07/2011 0501   BILIRUBINUR SMALL* 10/07/2011 0501   KETONESUR TRACE* 10/07/2011 0501   PROTEINUR NEGATIVE 10/07/2011 0501   UROBILINOGEN 0.2 10/07/2011 0501   NITRITE NEGATIVE 10/07/2011 0501   LEUKOCYTESUR NEGATIVE 10/07/2011 0501    STUDIES: No results found.  ASSESSMENT: 63 y.o. [place] woman status post left breast biopsy 07/21/2013 for a clinical T1 N0, stage IA invasive ductal carcinoma, grade 1, estrogen and progesterone receptor are both strongly positive, HER-2 not amplified, with an MIB-1 of 5%  (1) History of pulmonary embolism documented by CT angiography 10/07/2011 (9 days after ventral hernia repair); Dopplers 11-2011 showed no lower extremity DVTs; the patient received Coumadin for 10 months. Hypercoagulable  Workup performed in January 2015 showed negative LAC, anti-cardiolipin Abs, negative to borderline beta-2-glycoprotein Abs, AT, protein C and S, prothrombin II gene mutation and Factor V Leiden  (2) status post left lumpectomy and sentinel lymph node sampling 08/04/2013 for a pT1c pN0, stage IA invasive ductal carcinoma, grade 1, with repeat HER-2 again negative.  (3) Oncotype score of 14 predicts a risk of outside the breast recurrence within 10 years of 9% if the patient's only systemic therapy is tamoxifen for 5 years. Also predicts no benefit from chemotherapy.  (4) Adjuvant Radiation therapy  Started on 09/09/2013 completed on 10/13/2013  (5) Adjuvant Aromatase  inhibitor therapy with Letrozole : start date-11/19/2013  (6)  Letrozole was discontinued on Wednesday (12/03/2013) secondary to allergic reaction to letrozole  (7) Arimidex start date:  12/05/2013  (8) hot flashes  PLAN:  Ms. Breisch skin rash resolved today and she feels much better  .she is not a candidate for tamoxifen in view of her history of thrombosis. I discussed in detail with the patient on changing to  Arimidex. If she develops similar allergic reactions on Arimidex we'll plan for exemestane. I have given a one-month supply of Arimidex and if she tolerates she will call our office to get a month supply with 3 refills. She completed her radiation therapy on 10/13/2013. She says she tolerated radiation therapy very well.  We  I have reiterated  the benefits and side effects of Arimidex inhibitor therapy including myalgias joint pains, osteopenia,  Osteoporosis and hot flashes.    I  have prescribed Effexor-xr 37.5 mg to be taken by mouth once daily for her hot flashes   Bone densiometry revealed osteopenia; Next DEXA scan in April 2017. Continue calcium  with vitamin D supplementation    Next followup visit in 3 months with CBC differential and CMP   She understood the current plan of care and she knows to call for any problems that may develop before then.   Wilmon Arms, MD   Medical oncology   12/05/2013 5:33 PM

## 2013-12-12 ENCOUNTER — Ambulatory Visit (HOSPITAL_COMMUNITY)
Admission: RE | Admit: 2013-12-12 | Discharge: 2013-12-12 | Disposition: A | Discharge status: Home / Self Care | Payer: BC Managed Care – PPO | Source: Ambulatory Visit | Attending: Internal Medicine | Admitting: Internal Medicine

## 2013-12-12 ENCOUNTER — Other Ambulatory Visit: Payer: Self-pay | Admitting: *Deleted

## 2013-12-12 ENCOUNTER — Telehealth: Payer: Self-pay | Admitting: *Deleted

## 2013-12-12 DIAGNOSIS — Z86711 Personal history of pulmonary embolism: Secondary | ICD-10-CM | POA: Insufficient documentation

## 2013-12-12 DIAGNOSIS — M79609 Pain in unspecified limb: Secondary | ICD-10-CM

## 2013-12-12 DIAGNOSIS — C50212 Malignant neoplasm of upper-inner quadrant of left female breast: Secondary | ICD-10-CM

## 2013-12-12 NOTE — Progress Notes (Signed)
*  PRELIMINARY RESULTS* Vascular Ultrasound Left lower extremity venous duplex has been completed.  Preliminary findings: Left:  No evidence of DVT, superficial thrombosis, or Baker's cyst.  Called results to Dr. Jana Hakim.   Landry Mellow, RDMS, RVT  12/12/2013, 11:56 AM

## 2013-12-12 NOTE — Telephone Encounter (Addendum)
Received call from pt stating that Dr. Earnest Conroy changed her from femara to arimidex & she had been doing well with it up until today & reports bad pains in her left calf & it is hot to the touch.  She is at school & will take her 45"-1 hour to get here.  Will discuss with MD/NP need for doppler.  Order placed for doppler left lower extremity.  Notified pt to come @ 11 am to [facility] admitting.

## 2013-12-23 ENCOUNTER — Encounter (HOSPITAL_COMMUNITY): Payer: Self-pay

## 2013-12-23 ENCOUNTER — Other Ambulatory Visit: Payer: Self-pay | Admitting: Family Medicine

## 2013-12-23 ENCOUNTER — Telehealth: Payer: Self-pay | Admitting: Nurse Practitioner

## 2013-12-23 ENCOUNTER — Ambulatory Visit (HOSPITAL_COMMUNITY)
Admission: RE | Admit: 2013-12-23 | Discharge: 2013-12-23 | Disposition: A | Discharge status: Home / Self Care | Payer: Worker's Compensation | Source: Ambulatory Visit | Attending: Family Medicine | Admitting: Family Medicine

## 2013-12-23 DIAGNOSIS — X58XXXA Exposure to other specified factors, initial encounter: Secondary | ICD-10-CM | POA: Insufficient documentation

## 2013-12-23 DIAGNOSIS — R51 Headache: Secondary | ICD-10-CM | POA: Insufficient documentation

## 2013-12-23 DIAGNOSIS — S0990XA Unspecified injury of head, initial encounter: Secondary | ICD-10-CM

## 2013-12-24 NOTE — Telephone Encounter (Signed)
I called pt back and asked her to return call.  Have seen her for similar problem in the past, but this is new injury.

## 2013-12-24 NOTE — Telephone Encounter (Signed)
Patient returning Alyssa [name]'s call.

## 2013-12-26 NOTE — Telephone Encounter (Signed)
I called and LMVM for her again.

## 2013-12-26 NOTE — Telephone Encounter (Signed)
Patient returning Sandy's call.  She stated she's had another head injury at school, which will be worker's compensation.  After we receive approval from worker's compensation, appointment will be made for patient to come in.  She stated no return call is needed, unless there's a problem with Workers compensation.

## 2013-12-26 NOTE — Telephone Encounter (Signed)
After consulting with Diane in NP Referrals about WC, she stated we do not do WC.  I relayed to pt. She stated that she has spoken to the school and they will see if she can see [place].  She would like to since she has this history with [place].  I am not sure how that will work if we do not take WC.  She fell off chair (which she stated was unstable), when trying to adjust airconditioner.  She fell backwards, and hit head on table? Floor? She was not sure.  Has goose egg bump on the back lower left side head.  Went to ED. Had CT normal.  She is to call back if does not hear from [place].

## 2014-02-13 ENCOUNTER — Telehealth: Payer: Self-pay | Admitting: Oncology

## 2014-02-13 NOTE — Telephone Encounter (Signed)
cld & left pt VM-pt cld left VM stating wanting to chge appt for 7/28-adv pt to call back for r/s time & date

## 2014-02-17 ENCOUNTER — Other Ambulatory Visit: Payer: BC Managed Care – PPO

## 2014-02-17 ENCOUNTER — Ambulatory Visit: Payer: BC Managed Care – PPO | Admitting: Oncology

## 2014-02-17 ENCOUNTER — Telehealth: Payer: Self-pay | Admitting: Oncology

## 2014-02-17 ENCOUNTER — Other Ambulatory Visit: Payer: Self-pay

## 2014-02-17 DIAGNOSIS — C50212 Malignant neoplasm of upper-inner quadrant of left female breast: Secondary | ICD-10-CM

## 2014-02-17 MED ORDER — ANASTROZOLE 1 MG PO TABS: 1.0000 mg | ORAL_TABLET | Freq: Every day | ORAL | Status: DC

## 2014-02-17 NOTE — Telephone Encounter (Signed)
pt wanted to r/s appt-r/s and gave pt time & date

## 2014-02-17 NOTE — Telephone Encounter (Signed)
Returned pt call re: refill anastrazole.  Let pt know refill sent and receipt confirmed by pharmacy.  Pt voiced understanding.

## 2014-02-19 ENCOUNTER — Other Ambulatory Visit: Payer: Self-pay | Admitting: *Deleted

## 2014-02-19 DIAGNOSIS — C50212 Malignant neoplasm of upper-inner quadrant of left female breast: Secondary | ICD-10-CM

## 2014-02-20 ENCOUNTER — Telehealth: Payer: Self-pay | Admitting: Oncology

## 2014-02-20 ENCOUNTER — Ambulatory Visit (HOSPITAL_BASED_OUTPATIENT_CLINIC_OR_DEPARTMENT_OTHER): Payer: BC Managed Care – PPO | Admitting: Oncology

## 2014-02-20 ENCOUNTER — Other Ambulatory Visit (HOSPITAL_BASED_OUTPATIENT_CLINIC_OR_DEPARTMENT_OTHER): Payer: BC Managed Care – PPO

## 2014-02-20 VITALS — BP 148/70 | HR 78 | Temp 98.3°F | Resp 18 | Ht 62.5 in | Wt 217.7 lb

## 2014-02-20 DIAGNOSIS — I2699 Other pulmonary embolism without acute cor pulmonale: Secondary | ICD-10-CM

## 2014-02-20 DIAGNOSIS — C50219 Malignant neoplasm of upper-inner quadrant of unspecified female breast: Secondary | ICD-10-CM

## 2014-02-20 DIAGNOSIS — E079 Disorder of thyroid, unspecified: Secondary | ICD-10-CM

## 2014-02-20 DIAGNOSIS — C50212 Malignant neoplasm of upper-inner quadrant of left female breast: Secondary | ICD-10-CM

## 2014-02-20 DIAGNOSIS — Z86711 Personal history of pulmonary embolism: Secondary | ICD-10-CM

## 2014-02-20 DIAGNOSIS — Z170 Estrogen receptor positive status [ER+]: Secondary | ICD-10-CM

## 2014-02-20 DIAGNOSIS — M899 Disorder of bone, unspecified: Secondary | ICD-10-CM

## 2014-02-20 DIAGNOSIS — M949 Disorder of cartilage, unspecified: Secondary | ICD-10-CM

## 2014-02-20 DIAGNOSIS — R413 Other amnesia: Secondary | ICD-10-CM

## 2014-02-20 DIAGNOSIS — R61 Generalized hyperhidrosis: Secondary | ICD-10-CM

## 2014-02-20 LAB — CBC WITH DIFFERENTIAL/PLATELET
BASO%: 0.9 % (ref 0.0–2.0)
Basophils Absolute: 0 10*3/uL (ref 0.0–0.1)
EOS ABS: 0.1 10*3/uL (ref 0.0–0.5)
EOS%: 2.1 % (ref 0.0–7.0)
HEMATOCRIT: 44.1 % (ref 34.8–46.6)
HGB: 14.2 g/dL (ref 11.6–15.9)
LYMPH#: 1.6 10*3/uL (ref 0.9–3.3)
LYMPH%: 37.2 % (ref 14.0–49.7)
MCH: 27.7 pg (ref 25.1–34.0)
MCHC: 32.2 g/dL (ref 31.5–36.0)
MCV: 85.9 fL (ref 79.5–101.0)
MONO#: 0.3 10*3/uL (ref 0.1–0.9)
MONO%: 6.5 % (ref 0.0–14.0)
NEUT%: 53.3 % (ref 38.4–76.8)
NEUTROS ABS: 2.3 10*3/uL (ref 1.5–6.5)
Platelets: 245 10*3/uL (ref 145–400)
RBC: 5.13 10*6/uL (ref 3.70–5.45)
RDW: 13.9 % (ref 11.2–14.5)
WBC: 4.3 10*3/uL (ref 3.9–10.3)

## 2014-02-20 LAB — COMPREHENSIVE METABOLIC PANEL (CC13)
ALT: 59 U/L — AB (ref 0–55)
AST: 35 U/L — ABNORMAL HIGH (ref 5–34)
Albumin: 4 g/dL (ref 3.5–5.0)
Alkaline Phosphatase: 65 U/L (ref 40–150)
Anion Gap: 10 mEq/L (ref 3–11)
BUN: 18.2 mg/dL (ref 7.0–26.0)
CHLORIDE: 101 meq/L (ref 98–109)
CO2: 33 mEq/L — ABNORMAL HIGH (ref 22–29)
CREATININE: 0.9 mg/dL (ref 0.6–1.1)
Calcium: 10.1 mg/dL (ref 8.4–10.4)
Glucose: 152 mg/dl — ABNORMAL HIGH (ref 70–140)
Potassium: 3.8 mEq/L (ref 3.5–5.1)
Sodium: 143 mEq/L (ref 136–145)
Total Bilirubin: 0.72 mg/dL (ref 0.20–1.20)
Total Protein: 7.4 g/dL (ref 6.4–8.3)

## 2014-02-20 NOTE — Progress Notes (Signed)
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 Left diagnostic mammography and ultrasonography 07/21/2013 showed an irregular high-density mass in the upper inner quadrant of the left breast which was palpable at the 11:00 position 5 cm from the nipple. Ultrasound confirmed an irregular hypoechoic mass measuring 1.1 cm. The left axilla was unremarkable.  Biopsy of this mass was obtained the same day and showed ([id number]) an invasive ductal carcinoma, grade 1, estrogen and progesterone receptor both strongly positive by verbal report, with no HER-2 amplification (signals ratio 1.19, number per cell 1.85) and an MIB-1 of 5%.  The patient's subsequent history is as detailed below.  INTERVAL HISTORY: [name] returns today for followup of her breast cancer. At the last visit here she was switched to anastrozole. She is tolerating this generally well. She has hot flashes about 4 times a day, not at night. The hot flashes don't cause her to sweat and are not a major problem. Vaginal dryness is not an issue.  REVIEW OF SYSTEMS: "Good". She has a better diet, and is exercising more regularly now that she got a fit bit. She has lost a few pounds and is very encouraged by this. She was given a prescription for venlafaxine last visit but never filled it. A detailed review of systems today was otherwise noncontributory   PAST MEDICAL HISTORY: Past Medical History  Diagnosis Date  . Hyperlipidemia   . Thyroid  disease   . Abdominal pain   . Post concussion syndrome   . Ventral hernia   . Hypertension     PCP DR Nolon Rod  . Recurrent upper respiratory infection (URI)     FLU 08/23/11-08/29/11 then URI 08/29/11- 09/08/11- S/P zpack and steroids-  improved      no cough or congestion  . Hypothyroidism   . Post concussion syndrome     4 yrs ago- sees [facility]- Dr Leonie Man every 6 months-   has sleep study 2011 following accident but was neg per patient-  short term memory issues, dates and times  . Headache(784.0)   . Hernia, incisional periumbilical, incarcerated 9/62/9528  . Thyroid mass, left inferior lobe, 3.6cm [id number] 10/07/2011  . Pulmonary embolism   . Hot flashes   . Peripheral vascular disease     pulmonary embolis-still have some  . Cancer     breast  . Breast cancer 06/2013    left upper inner  . 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 Cholecystectomy  2006    lap chole, Dr. Bubba Camp  . Appendectomy  2002    lap appy.  Dr. Hassell Done  . Carpel tunnel      left in April 2011, right in June 2012  . Tarsal tunnel release      bilateral  . Cesarean section      x2  . Hernia  repair  09/15/11    Ventral w/mesh  . Ventral hernia repair  09/29/2011    Procedure: LAPAROSCOPIC VENTRAL HERNIA;  Surgeon: Adin Hector, MD;  Location: [facility];  Service: General;  Laterality: N/A;  Laparoscopic Ventral Wall Hernia Repair with Mesh  . Breast lumpectomy with needle localization and axillary sentinel lymph node bx Left 08/04/2013    Procedure: BREAST LUMPECTOMY WITH NEEDLE LOCALIZATION AND AXILLARY SENTINEL LYMPH NODE Biopsy x 3;  Surgeon: Rolm Bookbinder, MD;  Location: [facility];  Service: General;  Laterality: Left;    FAMILY HISTORY Family History  Problem Relation Age of Onset  .  Malignant hyperthermia Mother   . Heart attack Father   . Stroke Father   . Diabetes      Grandmother  . Other      respiratory- Grandmother  . Cancer Maternal Grandmother 97    breast   patient's father died at the age of 80 from a myocardial infarction. The patient's mother, Alyssa [name], is alive at 76. She lives with the patient, and is very independent (drives, etc.) The patient has 2 brothers, no sisters. The patient's mother is mother was diagnosed with breast cancer at the age of 34. There is no history of breast or ovarian cancer in the family (there is a history of cervical cancer in the patient's daughter).  GYNECOLOGIC HISTORY:  Menarche age 43, first live birth age 70, the patient is [place] P2. She stopped having periods approximately 1999 and took hormone replacements until 2005.  SOCIAL HISTORY:  [name] works as a Estate agent. She is divorced and lives with her mother Alyssa [name]. Daughter [name] is a business woman living in [place]. Son [name] lives in [place], also in business. The patient has 2 grandchildren. She attends [employer]    ADVANCED DIRECTIVES: Not in place   HEALTH MAINTENANCE: History  Substance Use Topics  . Smoking status: Never Smoker   . Smokeless tobacco: Never Used  . Alcohol Use: No     Colonoscopy: Never  PAP:  Bone density: Never  Lipid panel:  Allergies  Allergen Reactions  . Aleve [Naproxen Sodium] Hives, Itching and Swelling  . Letrozole Shortness Of Breath, Rash and Other (See Comments)    Swelling - feet, eyes,hands;  Speech garbled  . Codeine Other (See Comments)    hallucinations  . Other Swelling    Swelling of  Eyes   Wheat ,oranges banana, zucchini,cayenne,chili  Peppers, sweet potatoes, pumpkin  . Penicillins Hives, Swelling and Rash    All over the body.  Marland Kitchen Percocet [Oxycodone-Acetaminophen] Other (See Comments)    Causes syncope day after medication has been taken.  . Vicodin  [Hydrocodone-Acetaminophen] Other (See Comments)    Causes syncope day after medication has been taken.    Current Outpatient Prescriptions  Medication Sig Dispense Refill  . ALPRAZolam (XANAX) 0.5 MG tablet Take 0.5 mg by mouth at bedtime as needed.       Marland Kitchen anastrozole (ARIMIDEX) 1 MG tablet Take 1 tablet (1 mg total) by mouth daily.  30 tablet  3  . Calcium Carbonate-Vitamin D (CALTRATE 600+D PO) Take by mouth.      . cetirizine (ZYRTEC) 10 MG tablet Take 10 mg by mouth every evening.       . divalproex (DEPAKOTE) 500 MG DR tablet Take 1 tablet (500 mg total) by mouth at bedtime.  30 tablet  11  . hydrochlorothiazide (HYDRODIURIL) 25 MG tablet Take 25 mg by  mouth at bedtime.       Marland Kitchen thyroid (ARMOUR) 65 MG tablet Take 65 mg by mouth 2 (two) times daily.      . traZODone (DESYREL) 50 MG tablet Take 50 mg by mouth at bedtime.      Marland Kitchen venlafaxine XR (EFFEXOR-XR) 37.5 MG 24 hr capsule Take 1 capsule (37.5 mg total) by mouth daily with breakfast.  30 capsule  6  . VITAMIN D, CHOLECALCIFEROL, PO Take 1 capsule by mouth. Vitamin D 15, 000 units with Vitamin K       No current facility-administered medications for this visit.    OBJECTIVE: Middle-aged white woman in no acute distress Filed Vitals:   02/20/14 1506  BP: 148/70  Pulse: 78  Temp: 98.3 F (36.8 C)  Resp: 18     Body mass index is 39.16 kg/(m^2).    ECOG FS:0 - Asymptomatic  Sclerae unicteric, pupils equal and reactive Oropharynx clear and moist No cervical or supraclavicular adenopathy Lungs no rales or rhonchi Heart regular rate and rhythm Abd soft, obese, nontender, positive bowel sounds MSK no focal spinal tenderness, no upper extremity lymphedema Neuro: nonfocal, well oriented, appropriate affect Breasts: The right breast is unremarkable. The left breast status post lumpectomy and radiation. There is no evidence of local recurrence. The right axilla is benign.   LAB RESULTS:  CMP     Component Value Date/Time    NA 144 11/19/2013 1551   NA 142 08/01/2013 1535   K 3.5 11/19/2013 1551   K 4.4 08/01/2013 1535   CL 97 08/01/2013 1535   CO2 31* 11/19/2013 1551   CO2 31 08/01/2013 1535   GLUCOSE 107 11/19/2013 1551   GLUCOSE 103* 08/01/2013 1535   BUN 23.3 11/19/2013 1551   BUN 19 08/01/2013 1535   CREATININE 1.1 11/19/2013 1551   CREATININE 1.00 08/01/2013 1535   CALCIUM 9.4 11/19/2013 1551   CALCIUM 9.9 11/19/2013 1551   PROT 7.5 11/19/2013 1551   PROT 6.4 10/08/2011 0525   ALBUMIN 3.9 11/19/2013 1551   ALBUMIN 2.8* 10/08/2011 0525   AST 41* 11/19/2013 1551   AST 119* 10/08/2011 0525   ALT 66* 11/19/2013 1551   ALT 155* 10/08/2011 0525   ALKPHOS 71 11/19/2013 1551   ALKPHOS 151* 10/08/2011 0525   BILITOT 0.54 11/19/2013 1551   BILITOT 1.0 10/08/2011 0525   GFRNONAA 60* 08/01/2013 1535   GFRAA 69* 08/01/2013 1535    I No results found for this basename: SPEP,  UPEP,   kappa and lambda light chains    Lab Results  Component Value Date   WBC 4.3 02/20/2014   NEUTROABS 2.3 02/20/2014   HGB 14.2 02/20/2014   HCT 44.1 02/20/2014   MCV 85.9 02/20/2014   PLT 245 02/20/2014      Chemistry      Component Value Date/Time   NA 144 11/19/2013 1551   NA 142 08/01/2013 1535   K 3.5 11/19/2013 1551   K 4.4 08/01/2013 1535   CL 97 08/01/2013 1535   CO2 31* 11/19/2013 1551   CO2 31 08/01/2013 1535   BUN 23.3 11/19/2013 1551   BUN 19 08/01/2013 1535   CREATININE 1.1 11/19/2013 1551   CREATININE 1.00 08/01/2013 1535      Component Value Date/Time   CALCIUM 9.4 11/19/2013 1551   CALCIUM 9.9 11/19/2013 1551   ALKPHOS 71 11/19/2013 1551   ALKPHOS 151* 10/08/2011 0525   AST 41* 11/19/2013 1551   AST 119* 10/08/2011 0525  ALT 66* 11/19/2013 1551   ALT 155* 10/08/2011 0525   BILITOT 0.54 11/19/2013 1551   BILITOT 1.0 10/08/2011 0525       No results found for this basename: LABCA2    No components found with this basename: [id number]    No results found for this basename: INR,  in the last 168 hours  Urinalysis    Component Value Date/Time    COLORURINE AMBER* 10/07/2011 0501   APPEARANCEUR CLOUDY* 10/07/2011 0501   LABSPEC 1.034* 10/07/2011 0501   PHURINE 5.5 10/07/2011 0501   GLUCOSEU NEGATIVE 10/07/2011 0501   HGBUR NEGATIVE 10/07/2011 0501   BILIRUBINUR SMALL* 10/07/2011 0501   KETONESUR TRACE* 10/07/2011 0501   PROTEINUR NEGATIVE 10/07/2011 0501   UROBILINOGEN 0.2 10/07/2011 0501   NITRITE NEGATIVE 10/07/2011 0501   LEUKOCYTESUR NEGATIVE 10/07/2011 0501    STUDIES: CLINICAL DATA: Postmenopausal.  EXAM:  DUAL X-RAY ABSORPTIOMETRY (DXA) FOR BONE MINERAL DENSITY  FINDINGS:  AP LUMBAR SPINE (L3-4)  Bone Mineral Density (BMD): 0.861 g/cm2  Young Adult T-Score: -2.2  Z-Score: -0.6  LEFT FEMUR NECK  Bone Mineral Density (BMD): 0.761 g/cm2  Young Adult T-Score: -0.8  Z-Score: 0.6  ASSESSMENT: Patient's diagnostic category is LOW BONE MASS by WHO  Criteria.  FRACTURE RISK: MODERATE   ASSESSMENT: 63 y.o. [place] woman status post left breast biopsy 07/21/2013 for a clinical T1 N0, stage IA invasive ductal carcinoma, grade 1, estrogen and progesterone receptor are both strongly positive, HER-2 not amplified, with an MIB-1 of 5%  (1) History of pulmonary embolism documented by CT angiography 10/07/2011 (9 days after ventral hernia repair); Dopplers 11-2011 showed no lower extremity DVTs; the patient received Coumadin for 10 months. Hypercoagulable  Workup performed in January 2015 showed negative LAC, anti-cardiolipin Abs, negative to borderline beta-2-glycoprotein Abs, AT, protein C and S, prothrombin II gene mutation and Factor V Leiden  (2) status post left lumpectomy and sentinel lymph node sampling 08/04/2013 for a pT1c pN0, stage IA invasive ductal carcinoma, grade 1, with repeat HER-2 again negative.  (3) Oncotype score of 14 predicts a risk of outside the breast recurrence within 10 years of 9% if the patient's only systemic therapy is tamoxifen for 5 years. Also predicts no benefit from chemotherapy.  (4) Adjuvant  Radiation therapy  Started on 09/09/2013 completed on 10/13/2013  (5) Adjuvant Aromatase  inhibitor therapy with Letrozole : start date-11/19/2013  (6)  Letrozole was discontinued 12/03/2013 secondary to allergic reaction (rash)  (7) anastrozole start date:  12/05/2013  (8) hot flashes (9) osteopenia with a T score of -2.2 on bone density 10/29/2013    PLAN: Katrenia is tolerating the anastrozole well, and the plan will be to continue this for 5 years. She is on a good exercise program and calcium and vitamin D supplementation. She may benefit from Reclast. We will discuss that at the next visit which will be January 2016.  She knows to call for any problems that may develop before her next visit here.   Chauncey Cruel, MD   Medical oncology   02/20/2014 3:20 PM

## 2014-02-20 NOTE — Telephone Encounter (Signed)
sched pt appt....pt did not want print outs...she has Pharmacist, community

## 2014-02-23 ENCOUNTER — Encounter: Payer: Self-pay | Admitting: *Deleted

## 2014-02-23 NOTE — Addendum Note (Signed)
Addended by: Amelia Jo I on: 02/23/2014 02:19 PM   Modules accepted: Orders, Medications

## 2014-02-23 NOTE — Progress Notes (Signed)
Cancer Treatment Summary and Follow-Up Care Plan Provided by [facility] on 02/23/14   This Survivorship Care Plan summarizes information about your diagnosis, follow-up care, symptoms to watch for, and steps you can take to stay healthy.  The information in this care plan will be important for you to keep so that doctors and other health care providers that you see in the future will have information about your cancer, its treatment, and how best to work with you to monitor your health.  In the future, your healthcare providers may need more details about your cancer and how you were treated.  This Survivorship Care Plan may help you locate information related to your treatment.  Resources for cancer survivors are listed as part of the [place] so that you may obtain additional information and identify support services immediately or in the future. General Information  Patient Name: Alyssa [name]  Patient ID: [id number]  Phone: [phone] (home) [phone] (work)  Date of Birth: February 20, 1951  Age of Diagnosis: 63 y.o.  Support Contact: Extended Emergency Contact Information Primary Emergency Contact: Alyssa [name] Address: [place], [place] [address] [place] Phone: [phone] Mobile Phone: [phone] Relation: Significant other Secondary Emergency Contact: Alyssa [name] [place] Mobile Phone: [phone] Relation: Friend    Treatment Summary   Treatment Provider Summary  [facility] External Beam Radiation  TREATMENT DATES: 09/09/2013 through 10/13/2013  SITE/DOSE: Left breast: 5000 cGy in 25 sessions, no boost    Medical Oncology [place]at Anastrozole for 5 years- started 12/05/12  Surgery  [place] Left Lumpectomy with Sentinel Lymph Node Biopsy on 08/04/13  Supportive Care     Survivorship Care Plan   Time Month Provider Mammogram 1 year intervals (approximate) GYN Bone density   1 mo  Rad Onc     4 mo  Medical oncology     8 mo  Surgery YES    1 year  Rad Onc  If on tamoxifen   1 year 4 mo  Medical oncology   If on aromatase inhibitor  1 year 8 mo  Surgery YES    2 years  Medical oncology  If on tamoxifen   2 year 4 mo  Surgery     2 y 8 mo  Medical oncology YES    3 year  Surgery  If on tamoxifen   3 year 6 mo  Medical oncology   If on aromatase inhibitor  4 year  Surgery  If on tamoxifen   4 y 6 mo  Medical oncology YES    5 year  Surgery  If on tamoxifen If on aromatase inhibitor  Annually  PCP or other YES        Preventative Care Recommendations Get regular screenings for all body systems at frequencies indicated  Study/Test and Recommendations Frequency Most Recently Completed Next Due  Annual Physical   Primary Care Provider   Should include skin examination Annually  07/23/14  Colonoscopy   Beginning at age 30 unless clinically indicated to begin sooner Every 10 Years    Mammograms (Women)   Beginning at age 49 and continuing for as long as a woman is in good health- age may vary dependent on clinical indication Annually 06/2013 07/23/14  Pap Smear (Women)   Frequency to be discussed and determined by Primary Care Provider Discuss withSTALLINGS,[name], MD  Discuss Alyssa Carrow, MD  Healthy Lifestyle Recommendations  As a cancer survivor, it will be important to maintain a lifelong commitment to a healthy lifestyle.   Maintain a healthy weight   Exercise often per your doctor's orders   [place] balanced diet high in fruits, vegetables, bran and fiber   Limit how much alcohol you consume, if at all   Osteoporosis screening   Cardiovascular disease screening   Stop smoking (if you smoke)   Know yourself, your family history, and your risks   Be mindful of your emotional, social, and spiritual needs   Establish with a Primary Care Provider if you do not already have one   Attend Journey to a Healthy Lifestyle cancer  survivorship classes      Side Effects of Surgery  Possible Long-Term   Scars   Chronic Pain  Possible Late-Term (five or more years after treatment)   Lymphedema or Swelling of the Arms, Legs, Neck and Chest Region   Side Effects of Chemotherapy/Biotherapy  Possible Long-Term   Fatigue   Menopausal Symptoms   Neuropathy   Cognitive Dysfunction   Heart Failure   Kidney Failure   Infertility   Liver Problems  Possible Late-Term (five or more years after treatment)   Cataracts   Infertility   Liver Problems   Lung Disease   Osteoporosis   Reduced Lung Capacity   Secondary Primary Cancer   Side Effects of Radiation  Possible Long-Term   Fatigue   Skin Sensitivity  Possible Late-Term (five or more years after treatment) Whether you get late side effects will depend on: 1. The part of your body that was treated 2. The dose and length of your radiation therapy 3. If you received chemotherapy before, during or after radiation therapy   Skin Changes   Cataracts   Cavities and Tooth Decay   Heart Problems   Hypothyroidism   Lung Disease   Secondary Cancers   New Primary Cancers      Other Possible Effects of Cancer    Psychosocial Distress and Depression   Changes in Sex Life   Changes in Social and Work Relationships   Changes in Spirituality  At each visit, mention to your provider any late-term effects and issues that you are experiencing.   Symptoms to Watch for and Report to Your Provider    Return of the cancer symptoms you had before- such as a lump or new growth where your cancer first started   New or unusual pain that seems unrelated to an injury and does not go away, including back pain or bone pain   Weight loss without trying/intending   Unexplained bleeding   A rash or allergic reaction, such as swelling, sever itching or wheezing   Chills or fevers   Persistent headaches   Shortness of breath or difficulty breathing   Bloody Stools or  blood in your urine   Lumps, bumps, swelling and/or nipple discharge   Nausea, vomiting, diarrhea, loss of appetite, or trouble swallowing   A cough that doesn't go away   Abdominal pain   Swelling in your arms or legs   Fractures   Hot flashes or other menopausal symptoms   Any other signs mentioned by your doctor or nurse or any unusual symptoms                 that you just can't explain            Just because you have certain symptoms, it doesn't mean  the cancer has come back. Symptoms can be due to other problems that need to be addressed.   Other Needs/Concerns and Suggested Intervention(s)  Bone Health/Osteoporosis Risk:  Bone Densitometry every 1 to 2 years after initiation of aromatase inhibitor (if applicable) and/or at age 71 or older  Bone mineral Density test last done (10/29/13)  Calcium and Vitamin D  Weight-bearing exercise  Avoidance of smoking/smoking cessation counseling, if appropriate  Bisphosphonate, if indicated per Bone Densitometry  Depression:  Referral for psychiatric care or counseling and/or initiation of pharmacotherapy.  Hot Flashes:  Venlafaxine (Effexor) 37.55m up to 723mdaily OR  Gabapentin (Neurontin) 30055mt bedtime up to 3 times/day  OR  Referral to Gynecologist, Primary Care Provider or Cancer Provider.  Weight Gain or Overweight:  BMI= 39.16  Dietary Counseling  Referral to Dietician or Weight Management support group (ie: [employer], [employer], etc.)  Clinical Breast Exam Status:  Annual Clinical Breast Exam needed  Mammography Status:   Annual Mammography needed  Discuss with your Primary Care Provider or Cancer Provider  General Anxiety: Referral to support groups:  [email or web address] Ricker800.227.23  [place] resources: [phone] about risk of Recurrence:  Patients need to know that concerns/feelings of recurrent disease are common  Referral to support  groups:  AmeClaytonw.cancer.org, 1.760-490-3114  Screening for other Cancers:  Cancer diagnosis does not change screening practices for other cancers.  Refer to the Preventative Care section for scheduling/guideline details.  Insomnia: As appropriate:  Instructions on sleep hygiene  Pain evaluation  Hot flash management  Treatment for anxiety  Fatigue:  Regular physical activity- walking 20 minutes daily  Evaluation for hypothyroidism, anemia, depression  Memory problems and/or confusion:  Patients should know that 25% of cancer patients have cognitive dysfunction after treatment and it usually gets better over time  Rule out depression, sleep disturbance  Lymphedema:  Referral to lymphedema physical therapy specialist for lymphatic massage  Weight training  Maintenance of healthy weight  Decreased range of motion:  Referral to Physical Therapy  Joint aches and pains: Symptom management  Refer to Physical therapy   Other persistent pain:  Treatment for pain and/or referral to pain management specialist   Pain with intercourse/vaginal dryness:  Recommendation for vaginal lubricant (Astroglide or Liquid Silk) and/or moisturizer (Replens, 1x every 3 days)  Consider low dose vaginal estrogen (Estring, Premarin, Estrace) if cancer is not hormone sensitive.  Breast Cancer survivors should discuss this with their oncologist.   Intimacy and sexuality:  Evaluation and treatment for anxiety, depression, as appropriate  Refer to sexual counselor and/or GYN.  Consider a support group to address body image concerns, such as breast asymmetry, prosthesis:  American Cancer Society's Look Good ... Feel Better program (www.cancer.org, 1.760-490-3114)  Marital/partner/family relationships:  Referral to Social Worker  Genetic Risk:  Given compelling family history of breast cancer:  Referral for genetic testing (BRCA)  Referral for counseling by a medical oncologist  and genetic counselor.  Referral to Gynecologist for ovarian cancer risk-evaluation  Employment, Hea[employer]ist, product/process developmentd Finances  Referral to SocEducation officer, museumellness- Diet, Exercise, Smoking Cessation  Maintain a healthy body weight  Regular physical activity (walking 20 minutes daily)  Avoidance of smoking/smoking cessation counseling, if appropriate  Limitation of alcohol intake to less than one drink, two to three times a week

## 2014-02-27 ENCOUNTER — Other Ambulatory Visit: Payer: Self-pay | Admitting: Family Medicine

## 2014-02-27 DIAGNOSIS — R102 Pelvic and perineal pain: Secondary | ICD-10-CM

## 2014-03-04 ENCOUNTER — Ambulatory Visit
Admission: RE | Admit: 2014-03-04 | Discharge: 2014-03-04 | Disposition: A | Discharge status: Home / Self Care | Payer: BC Managed Care – PPO | Source: Ambulatory Visit | Attending: Family Medicine | Admitting: Family Medicine

## 2014-03-04 DIAGNOSIS — R102 Pelvic and perineal pain: Secondary | ICD-10-CM

## 2014-03-09 ENCOUNTER — Ambulatory Visit: Payer: Self-pay | Admitting: Nurse Practitioner

## 2014-03-09 ENCOUNTER — Other Ambulatory Visit: Payer: Self-pay

## 2014-05-08 ENCOUNTER — Other Ambulatory Visit: Payer: Self-pay

## 2014-05-28 ENCOUNTER — Other Ambulatory Visit: Payer: Self-pay | Admitting: Oncology

## 2014-05-28 ENCOUNTER — Other Ambulatory Visit: Payer: Self-pay

## 2014-05-28 DIAGNOSIS — C50912 Malignant neoplasm of unspecified site of left female breast: Secondary | ICD-10-CM

## 2014-07-13 ENCOUNTER — Ambulatory Visit
Admission: RE | Admit: 2014-07-13 | Discharge: 2014-07-13 | Disposition: A | Discharge status: Home / Self Care | Payer: BC Managed Care – PPO | Source: Ambulatory Visit | Attending: Oncology | Admitting: Oncology

## 2014-07-13 DIAGNOSIS — C50912 Malignant neoplasm of unspecified site of left female breast: Secondary | ICD-10-CM

## 2014-07-14 ENCOUNTER — Encounter: Payer: Self-pay | Admitting: Nurse Practitioner

## 2014-07-14 ENCOUNTER — Ambulatory Visit (INDEPENDENT_AMBULATORY_CARE_PROVIDER_SITE_OTHER): Payer: BC Managed Care – PPO | Admitting: Nurse Practitioner

## 2014-07-14 VITALS — BP 124/76 | HR 66 | Ht 62.25 in | Wt 226.4 lb

## 2014-07-14 DIAGNOSIS — F0781 Postconcussional syndrome: Secondary | ICD-10-CM

## 2014-07-14 DIAGNOSIS — R413 Other amnesia: Secondary | ICD-10-CM

## 2014-07-14 MED ORDER — DIVALPROEX SODIUM 500 MG PO DR TAB: 500.0000 mg | DELAYED_RELEASE_TABLET | Freq: Every day | ORAL | Status: DC

## 2014-07-14 NOTE — Patient Instructions (Signed)
Memory score 30 out of 30 Continue Depakote 500 at bedtime for headaches will refill Follow-up yearly and when necessary

## 2014-07-14 NOTE — Progress Notes (Addendum)
[facility]  PATIENT: Alyssa [name] DOB: Sep 02, 1950   REASON FOR VISIT: Follow-up for cognitive changes post head injury  HISTORY OF PRESENT ILLNESS:Alyssa [name], 63 year old female returns for followup. She was last seen in the office 07/14/2013. Patient fell off of the chair at school back in June and hit her head CT of the head at that time without abnormalities her headaches returned and she started taking her Depakote again with good benefit. Her memory is stable per MMSE. 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 She has an additional job working at [employer]. She has not had any issues performing either of her jobs.  She had breast cancer surgery January 2015. She returns for reevaluation and refills   HISTORY: of cognitive changes post head injury. She was initially evaluated by Dr. Leonie Man in September of 2009. She was operating a push mower when she was going down a ditch in front of her home and toppled over the handle and hit her head on the motor of the mower. At that time she was not sure she loss consciousness or not,she was able to get up go indoors. She did not seek medical help that day, however she did see her primary care the next day when she was complaining of severe headache, appeared to be confused and disoriented and did not recognize the Dr. CT of the head showed no acute abnormality. She has since had an MRI of the brain which was read as normal as well as an EEG that was normal. She returns today continuing to complain with short-term memory, attention, and concentration difficulties but she continues to work and she has not had any difficulty performing her job duties. She says she manages by using sticky notes, so that she will not forget things. She denies any depression ,she is trying to exercise and has lost weight since last seen. She does complain of early morning headaches and fatigue, does not feel rested after  sleeping although that is better with Trazodone. She is currently on Depakote 500 daily 2 daily for headaches.Her headaches have returned since she is back to teaching after being off for the summer. She says these are stress related. She does not want to increase medication. There is no nausea, photophobia, with the headaches. 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 Fell several weeks ago and hit the back of the head. No obvious injury. See ROS  07/18/12: Returns for followup. MMSE stable. Went off Depakote for a few weeks and headaches returned and mood was terrible. Her PCP gave her a RX to get back on the Depakote. Headaches are stable at present. Continues to work with out problems with performing job. Had hernia repair in March and complication of PE after surgery. Has just gotten off coumadin. No new complaints   REVIEW OF SYSTEMS: Full 14 system review of systems performed and notable only for those listed, all others are neg:  Constitutional: N/A  Cardiovascular: N/A  Ear/Nose/Throat: N/A  Skin: N/A  Eyes: N/A  Respiratory: N/A  Gastroitestinal: N/A  Hematology/Lymphatic: N/A  Endocrine: N/A Musculoskeletal:N/A  Allergy/Immunology: N/A  Neurological:  Occasional headache Psychiatric:  Decreased concentration Sleep :  Insomnia  ALLERGIES: Allergies  Allergen Reactions  . Aleve [Naproxen Sodium] Hives, Itching and Swelling  . Letrozole Shortness Of Breath, Rash and Other (See Comments)    Swelling - feet, eyes,hands;  Speech garbled  . Codeine Other (See Comments)  hallucinations  . Other Swelling    Swelling of  Eyes   Wheat ,oranges banana, zucchini,cayenne,chili  Peppers, sweet potatoes, pumpkin  . Penicillins Hives, Swelling and Rash    All over the body.  Marland Kitchen Percocet [Oxycodone-Acetaminophen] Other (See Comments)    Causes syncope day after medication has been taken.  . Vicodin [Hydrocodone-Acetaminophen] Other (See Comments)    Causes syncope day  after medication has been taken.    HOME MEDICATIONS: Outpatient Prescriptions Prior to Visit  Medication Sig Dispense Refill  . ALPRAZolam (XANAX) 0.5 MG tablet Take 0.5 mg by mouth at bedtime as needed.     Marland Kitchen anastrozole (ARIMIDEX) 1 MG tablet Take 1 tablet (1 mg total) by mouth daily. 30 tablet 3  . cetirizine (ZYRTEC) 10 MG tablet Take 10 mg by mouth every evening.     . divalproex (DEPAKOTE) 500 MG DR tablet Take 1 tablet (500 mg total) by mouth at bedtime. 30 tablet 11  . hydrochlorothiazide (HYDRODIURIL) 25 MG tablet Take 25 mg by mouth at bedtime.     Marland Kitchen thyroid (ARMOUR) 65 MG tablet Take 65 mg by mouth 2 (two) times daily.    . traZODone (DESYREL) 50 MG tablet Take 50 mg by mouth at bedtime.    Marland Kitchen VITAMIN D, CHOLECALCIFEROL, PO Take 1 capsule by mouth. Vitamin D 15, 000 units with Vitamin K    . Calcium Carbonate-Vitamin D (CALTRATE 600+D PO) Take by mouth.     No facility-administered medications prior to visit.    PAST MEDICAL HISTORY: Past Medical History  Diagnosis Date  . Hyperlipidemia   . Thyroid disease   . Abdominal pain   . Post concussion syndrome   . Ventral hernia   . Hypertension     PCP DR Nolon Rod  . Recurrent upper respiratory infection (URI)     FLU 08/23/11-08/29/11 then URI 08/29/11- 09/08/11- S/P zpack and steroids-  improved      no cough or congestion  . Hypothyroidism   . Post concussion syndrome     4 yrs ago- sees [facility]- Dr Leonie Man every 6 months-   has sleep study 2011 following accident but was neg per patient-  short term memory issues, dates and times  . Headache(784.0)   . Hernia, incisional periumbilical, incarcerated 5/62/5638  . 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 Pulmonary embolism   . Hot flashes   . Peripheral vascular disease     pulmonary embolis-still have some  . Cancer     breast  . Breast cancer 06/2013    left upper inner  . Hx of radiation therapy 09/09/13- 10/13/13    left breast 5000 cGy in 25  sessions, no boost    PAST SURGICAL HISTORY: Past Surgical History  Procedure Laterality Date  . Cholecystectomy  2006    lap chole, Dr. Bubba Camp  . Appendectomy  2002    lap appy.  Dr. Hassell Done  . Carpel tunnel      left in April 2011, right in June 2012  . Tarsal tunnel release      bilateral  . Cesarean section      x2  . Hernia repair  09/15/11    Ventral w/mesh  . Ventral hernia repair  09/29/2011    Procedure: LAPAROSCOPIC VENTRAL HERNIA;  Surgeon: Adin Hector, MD;  Location: [facility];  Service: General;  Laterality: N/A;  Laparoscopic Ventral Wall Hernia Repair with Mesh  . Breast lumpectomy with needle localization and axillary  sentinel lymph node bx Left 08/04/2013    Procedure: BREAST LUMPECTOMY WITH NEEDLE LOCALIZATION AND AXILLARY SENTINEL LYMPH NODE Biopsy x 3;  Surgeon: Rolm Bookbinder, MD;  Location: [facility] OR;  Service: General;  Laterality: Left;    FAMILY HISTORY: Family History  Problem Relation Age of Onset  . Malignant hyperthermia Mother   . Heart attack Father   . Stroke Father   . Diabetes      Grandmother  . Other      respiratory- Grandmother  . Cancer Maternal Grandmother 40    breast    SOCIAL HISTORY: History   Social History  . Marital Status: Divorced    Spouse Name: N/A    Number of Children: 2  . Years of Education: Master's   Occupational History  .     Social History Main Topics  . Smoking status: Never Smoker   . Smokeless tobacco: Never Used  . Alcohol Use: No  . Drug Use: No  . Sexual Activity: Yes     Comment: Menarche age 24, first live birth age 78,  [place] P2. She stopped having periods approximately 1999 and took HRT until 2005.   Other Topics Concern  . Not on file   Social History Narrative   Patient is divorced and lives alone.   Patient has two children.   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   Patient has a Master's degree.   Patient drinks 3-4 cups of caffeine daily.    Patient is right handed.      PHYSICAL EXAM  Filed Vitals:   07/14/14 1351  BP: 124/76  Pulse: 66  Height: 5' 2.25" (1.581 m)  Weight: 226 lb 6.4 oz (102.694 kg)   Body mass index is 41.08 kg/(m^2). Generalized: Well developed,  obese female in no acute distress  Head: normocephalic and atraumatic,. Oropharynx benign  Neck: Supple, no carotid bruits  Cardiac: Regular rate rhythm, no murmur  Musculoskeletal: No deformity   Neurological examination   Mentation: Alert oriented to time, place, history taking. MMSE 30/30. AFT 21. Follows all commands speech and language fluent  Cranial nerve II-XII: Pupils were equal round reactive to light extraocular movements were full, visual field were full on confrontational test. Facial sensation and strength were normal. hearing was intact to finger rubbing bilaterally. Uvula tongue midline. head turning and shoulder shrug were normal and symmetric.Tongue protrusion into cheek strength was normal. Motor: normal bulk and tone, full strength in the BUE, BLE, fine finger movements normal, no pronator drift. No focal weakness Sensory: normal and symmetric to light touch, pinprick, and vibration  Coordination: finger-nose-finger, heel-to-shin bilaterally, no dysmetria Reflexes: 1+ upper lower and symmetric Gait and Station: Rising up from seated position without assistance, normal stance, moderate stride, good arm swing, smooth turning, able to perform tiptoe, and heel walking without difficulty. Tandem gait is steady DIAGNOSTIC DATA (LABS, IMAGING, TESTING) - I reviewed patient records, labs, notes, testing and imaging myself where available.  Lab Results  Component Value Date   WBC 4.3 02/20/2014   HGB 14.2 02/20/2014   HCT 44.1 02/20/2014   MCV 85.9 02/20/2014   PLT 245 02/20/2014      Component Value Date/Time   NA 143 02/20/2014 1442   NA 142 08/01/2013 1535   K 3.8 02/20/2014 1442   K 4.4 08/01/2013 1535   CL 97 08/01/2013 1535   CO2 33* 02/20/2014  1442   CO2 31 08/01/2013 1535   GLUCOSE 152* 02/20/2014 1442  GLUCOSE 103* 08/01/2013 1535   BUN 18.2 02/20/2014 1442   BUN 19 08/01/2013 1535   CREATININE 0.9 02/20/2014 1442   CREATININE 1.00 08/01/2013 1535   CALCIUM 10.1 02/20/2014 1442   CALCIUM 9.4 11/19/2013 1551   PROT 7.4 02/20/2014 1442   PROT 6.4 10/08/2011 0525   ALBUMIN 4.0 02/20/2014 1442   ALBUMIN 2.8* 10/08/2011 0525   AST 35* 02/20/2014 1442   AST 119* 10/08/2011 0525   ALT 59* 02/20/2014 1442   ALT 155* 10/08/2011 0525   ALKPHOS 65 02/20/2014 1442   ALKPHOS 151* 10/08/2011 0525   BILITOT 0.72 02/20/2014 1442   BILITOT 1.0 10/08/2011 0525   GFRNONAA 60* 08/01/2013 1535   GFRAA 78* 08/01/2013 1535    ASSESSMENT AND PLAN  63 y.o. year old female  has a past medical history of cognitive difficulties following closed head injury, likely post concussion syndrome. The patient is a current patient of Dr. Leonie Man  who is out of the office today . This note is sent to the work in doctor.     Memory score 30 out of 30 Continue Depakote 500 at bedtime for headaches will refill Follow-up yearly and when necessary Dennie Bible, [facility], [facility], APRN  [facility] [address], [place], [place] [address] [phone] Dr.  Geoffry Paradise examined patient and images, agree with history, physical, neuro exam as stated above. Agree with assessment and plan.   Sarina Ill, MD [facility]

## 2014-07-20 ENCOUNTER — Telehealth: Payer: Self-pay

## 2014-07-20 MED ORDER — DIVALPROEX SODIUM ER 500 MG PO TB24: 500.0000 mg | ORAL_TABLET | Freq: Every day | ORAL | Status: DC

## 2014-07-20 NOTE — Telephone Encounter (Signed)
[facility] sent [place] a fax regarding Depakote.  Although we have been prescribing Depakote DR for over 1 year, apparently they have been dispensing Depakote ER.  They would like to know if they should continue filling ER, or change to DR as we have prescribed.  Please advise.  Thank you.

## 2014-07-20 NOTE — Telephone Encounter (Signed)
Rx has been updated and resent.  Pharmacy is aware.

## 2014-07-20 NOTE — Telephone Encounter (Signed)
ER is okay with me.

## 2014-07-21 ENCOUNTER — Telehealth: Payer: Self-pay | Admitting: Oncology

## 2014-07-21 ENCOUNTER — Telehealth: Payer: Self-pay | Admitting: Physical Therapy

## 2014-07-21 NOTE — Telephone Encounter (Signed)
Lvm advising appt time chg from 3.30 on 1/27 to 1.45pm.

## 2014-07-21 NOTE — Telephone Encounter (Signed)
Called pt,left message regarding appt with Leone Payor on 1/4,asked pt to call back to r/s

## 2014-08-10 ENCOUNTER — Telehealth: Payer: Self-pay | Admitting: Oncology

## 2014-08-10 NOTE — Telephone Encounter (Signed)
pt cld & left vm wanting to move appt to later time-cld & spoke to pt & r/s appt per pr req 2/17 gave pt time & date

## 2014-08-19 ENCOUNTER — Other Ambulatory Visit: Payer: Self-pay

## 2014-08-19 ENCOUNTER — Ambulatory Visit: Payer: Self-pay | Admitting: Nurse Practitioner

## 2014-08-19 ENCOUNTER — Ambulatory Visit: Payer: Self-pay | Admitting: Oncology

## 2014-09-08 ENCOUNTER — Other Ambulatory Visit: Payer: Self-pay | Admitting: *Deleted

## 2014-09-08 DIAGNOSIS — C50212 Malignant neoplasm of upper-inner quadrant of left female breast: Secondary | ICD-10-CM

## 2014-09-09 ENCOUNTER — Encounter: Payer: Self-pay | Admitting: Nurse Practitioner

## 2014-09-09 ENCOUNTER — Ambulatory Visit (HOSPITAL_BASED_OUTPATIENT_CLINIC_OR_DEPARTMENT_OTHER): Payer: BC Managed Care – PPO | Admitting: Nurse Practitioner

## 2014-09-09 ENCOUNTER — Telehealth: Payer: Self-pay | Admitting: Nurse Practitioner

## 2014-09-09 ENCOUNTER — Other Ambulatory Visit (HOSPITAL_BASED_OUTPATIENT_CLINIC_OR_DEPARTMENT_OTHER): Payer: BC Managed Care – PPO

## 2014-09-09 VITALS — BP 149/77 | HR 88 | Temp 98.2°F | Resp 18 | Ht 62.25 in | Wt 222.4 lb

## 2014-09-09 DIAGNOSIS — R740 Nonspecific elevation of levels of transaminase and lactic acid dehydrogenase [LDH]: Secondary | ICD-10-CM

## 2014-09-09 DIAGNOSIS — C50212 Malignant neoplasm of upper-inner quadrant of left female breast: Secondary | ICD-10-CM

## 2014-09-09 DIAGNOSIS — R232 Flushing: Secondary | ICD-10-CM | POA: Insufficient documentation

## 2014-09-09 DIAGNOSIS — M8580 Other specified disorders of bone density and structure, unspecified site: Secondary | ICD-10-CM

## 2014-09-09 DIAGNOSIS — R7401 Elevation of levels of liver transaminase levels: Secondary | ICD-10-CM

## 2014-09-09 DIAGNOSIS — N951 Menopausal and female climacteric states: Secondary | ICD-10-CM

## 2014-09-09 LAB — CBC WITH DIFFERENTIAL/PLATELET
BASO%: 0.7 % (ref 0.0–2.0)
Basophils Absolute: 0 10*3/uL (ref 0.0–0.1)
EOS%: 2.7 % (ref 0.0–7.0)
Eosinophils Absolute: 0.1 10*3/uL (ref 0.0–0.5)
HCT: 43.6 % (ref 34.8–46.6)
HGB: 14.4 g/dL (ref 11.6–15.9)
LYMPH#: 1.5 10*3/uL (ref 0.9–3.3)
LYMPH%: 34.8 % (ref 14.0–49.7)
MCH: 29 pg (ref 25.1–34.0)
MCHC: 33 g/dL (ref 31.5–36.0)
MCV: 87.9 fL (ref 79.5–101.0)
MONO#: 0.4 10*3/uL (ref 0.1–0.9)
MONO%: 9 % (ref 0.0–14.0)
NEUT%: 52.8 % (ref 38.4–76.8)
NEUTROS ABS: 2.3 10*3/uL (ref 1.5–6.5)
PLATELETS: 268 10*3/uL (ref 145–400)
RBC: 4.96 10*6/uL (ref 3.70–5.45)
RDW: 13.3 % (ref 11.2–14.5)
WBC: 4.4 10*3/uL (ref 3.9–10.3)

## 2014-09-09 LAB — COMPREHENSIVE METABOLIC PANEL (CC13)
ALBUMIN: 3.9 g/dL (ref 3.5–5.0)
ALK PHOS: 101 U/L (ref 40–150)
ALT: 81 U/L — ABNORMAL HIGH (ref 0–55)
AST: 60 U/L — AB (ref 5–34)
Anion Gap: 12 mEq/L — ABNORMAL HIGH (ref 3–11)
BILIRUBIN TOTAL: 0.47 mg/dL (ref 0.20–1.20)
BUN: 17.3 mg/dL (ref 7.0–26.0)
CO2: 33 mEq/L — ABNORMAL HIGH (ref 22–29)
Calcium: 10.3 mg/dL (ref 8.4–10.4)
Chloride: 99 mEq/L (ref 98–109)
Creatinine: 1 mg/dL (ref 0.6–1.1)
EGFR: 63 mL/min/{1.73_m2} — AB (ref >90)
Glucose: 131 mg/dl (ref 70–140)
POTASSIUM: 4.4 meq/L (ref 3.5–5.1)
SODIUM: 144 meq/L (ref 136–145)
TOTAL PROTEIN: 7.6 g/dL (ref 6.4–8.3)

## 2014-09-09 MED ORDER — CLONIDINE HCL 0.1 MG/24HR TD PTWK: 0.1000 mg | MEDICATED_PATCH | TRANSDERMAL | Status: DC

## 2014-09-09 MED ORDER — TRAMADOL HCL 50 MG PO TABS: 50.0000 mg | ORAL_TABLET | Freq: Four times a day (QID) | ORAL | Status: DC | PRN

## 2014-09-09 MED ORDER — ANASTROZOLE 1 MG PO TABS: 1.0000 mg | ORAL_TABLET | Freq: Every day | ORAL | Status: DC

## 2014-09-09 NOTE — Telephone Encounter (Signed)
per pof to sch pt appt-adv pt [facility] will call to sch [facility]-gave pt copy of sch

## 2014-09-09 NOTE — Progress Notes (Signed)
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 Left diagnostic mammography and ultrasonography 07/21/2013 showed an irregular high-density mass in the upper inner quadrant of the left breast which was palpable at the 11:00 position 5 cm from the nipple. Ultrasound confirmed an irregular hypoechoic mass measuring 1.1 cm. The left axilla was unremarkable.  Biopsy of this mass was obtained the same day and showed ([id number]) an invasive ductal carcinoma, grade 1, estrogen and progesterone receptor both strongly positive by verbal report, with no HER-2 amplification (signals ratio 1.19, number per cell 1.85) and an MIB-1 of 5%.  The patient's subsequent history is as detailed below.  INTERVAL HISTORY: Alyssa [name] returns today for follow up of her breast cancer. She has been on anastrozole since May 2015 and is tolerating reasonably well. Her greatest complaint is hot flashes and weight gain. I explained to her that the weight gain is simply a result of menopause. She tried venlafaxine for her hot flashes and "felt like a zombie" so she stopped taking it. She needs knee replacement, but her knee pain is no worse. She does have bilateral foot pain and intermittent swelling. She has been worked up by an orthopedic specialist, but no fractures have been located.  REVIEW OF SYSTEMS: Alyssa [name] denies fevers, chills, nausea, vomiting, or changes in bowel or bladder habits. She is eating and drinking well. She denies shortness of  breath, chest pain, cough, palpitations, or fatigue. She does not sleep well despite the use of a few sleep aids. She denies headaches, dizziness, or vision changes. A detailed review of systems is otherwise entirely negative.    PAST MEDICAL HISTORY: Past Medical History  Diagnosis Date  . Hyperlipidemia   . Thyroid disease   . Abdominal pain   . Post concussion syndrome   . Ventral hernia   . Hypertension     PCP DR Nolon Rod  . Recurrent upper respiratory infection (URI)     FLU 08/23/11-08/29/11 then URI 08/29/11- 09/08/11- S/P zpack and steroids-  improved      no cough or congestion  . Hypothyroidism   . Post concussion syndrome     4 yrs ago- sees [facility]- Dr Leonie Man every 6 months-   has sleep study 2011 following accident but was neg per patient-  short term memory issues, dates and times  . Headache(784.0)   . Hernia, incisional periumbilical, incarcerated 2/83/1517  . Thyroid mass, left inferior lobe, 3.6cm [id number] 10/07/2011  . Pulmonary embolism   . Hot flashes   . Peripheral vascular disease     pulmonary embolis-still have some  . Cancer     breast  . Breast cancer 06/2013    left upper inner  . 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 Cholecystectomy  2006    lap chole, Dr. Bubba Camp  . Appendectomy  2002  lap appy.  Dr. Hassell Done  . Carpel tunnel      left in April 2011, right in June 2012  . Tarsal tunnel release      bilateral  . Cesarean section      x2  . Hernia repair  09/15/11    Ventral w/mesh  . Ventral hernia repair  09/29/2011    Procedure: LAPAROSCOPIC VENTRAL HERNIA;  Surgeon: Adin Hector, MD;  Location: [facility];  Service: General;  Laterality: N/A;  Laparoscopic Ventral Wall Hernia Repair with Mesh  . Breast lumpectomy with needle  localization and axillary sentinel lymph node bx Left 08/04/2013    Procedure: BREAST LUMPECTOMY WITH NEEDLE LOCALIZATION AND AXILLARY SENTINEL LYMPH NODE Biopsy x 3;  Surgeon: Rolm Bookbinder, MD;  Location: [facility];  Service: General;  Laterality: Left;    FAMILY HISTORY Family History  Problem Relation Age of Onset  . Malignant hyperthermia Mother   . Heart attack Father   . Stroke Father   . Diabetes      Grandmother  . Other      respiratory- Grandmother  . Cancer Maternal Grandmother 41    breast   patient's father died at the age of 19 from a myocardial infarction. The patient's mother, Alyssa [name], is alive at 63. She lives with the patient, and is very independent (drives, etc.) The patient has 2 brothers, no sisters. The patient's mother is mother was diagnosed with breast cancer at the age of 80. There is no history of breast or ovarian cancer in the family (there is a history of cervical cancer in the patient's daughter).  GYNECOLOGIC HISTORY:  Menarche age 23, first live birth age 62, the patient is [place] P2. She stopped having periods approximately 1999 and took hormone replacements until 2005.  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 She is divorced and lives with her mother Alyssa [name]. Daughter Alyssa [name] is a business woman living in [place]. Son Alyssa [name] lives in [place], also in business. The patient has 2 grandchildren. She attends [employer]    ADVANCED DIRECTIVES: Not in place   HEALTH MAINTENANCE: History  Substance Use Topics  . Smoking status: Never Smoker   . Smokeless tobacco: Never Used  . Alcohol Use: No     Colonoscopy: Never  PAP:  Bone density: Never  Lipid panel:  Allergies  Allergen Reactions  . Aleve [Naproxen Sodium] Hives, Itching and Swelling  . Letrozole Shortness Of Breath, Rash and Other (See Comments)    Swelling - feet, eyes,hands;  Speech garbled  . Codeine Other (See Comments)     hallucinations  . Other Swelling    Swelling of  Eyes   Wheat ,oranges banana, zucchini,cayenne,chili  Peppers, sweet potatoes, pumpkin  . Penicillins Hives, Swelling and Rash    All over the body.  Marland Kitchen Percocet [Oxycodone-Acetaminophen] Other (See Comments)    Causes syncope day after medication has been taken.  . Vicodin [Hydrocodone-Acetaminophen] Other (See Comments)    Causes syncope day after medication has been taken.    Current Outpatient Prescriptions  Medication Sig Dispense Refill  . ALPRAZolam (XANAX) 0.5 MG tablet Take 0.5 mg by mouth at bedtime as needed.     Marland Kitchen anastrozole (ARIMIDEX) 1 MG tablet Take 1 tablet (1 mg total) by mouth daily. 90 tablet 3  . cetirizine (ZYRTEC) 10 MG tablet Take 10 mg by mouth every evening.     . Cholecalciferol (VITAMIN D3) 5000 UNITS CAPS Take  1 capsule by mouth daily.    . divalproex (DEPAKOTE ER) 500 MG 24 hr tablet Take 1 tablet (500 mg total) by mouth at bedtime. 90 tablet 3  . hydrochlorothiazide (HYDRODIURIL) 25 MG tablet Take 25 mg by mouth at bedtime.     Marland Kitchen NATURE-THROID 97.5 MG TABS Take 97.5 mg by mouth 2 (two) times daily.    . traZODone (DESYREL) 50 MG tablet Take 50 mg by mouth at bedtime.    . cloNIDine (CATAPRES - DOSED IN MG/24 HR) 0.1 mg/24hr patch Place 1 patch (0.1 mg total) onto the skin once a week. 4 patch 5  . traMADol (ULTRAM) 50 MG tablet Take 1 tablet (50 mg total) by mouth every 6 (six) hours as needed. 120 tablet 0   No current facility-administered medications for this visit.    OBJECTIVE: Middle-aged white woman in no acute distress Filed Vitals:   09/09/14 1458  BP: 149/77  Pulse: 88  Temp: 98.2 F (36.8 C)  Resp: 18     Body mass index is 40.37 kg/(m^2).    ECOG FS:0 - Asymptomatic   Skin: warm, dry  HEENT: sclerae anicteric, conjunctivae pink, oropharynx clear. No thrush or mucositis.  Lymph Nodes: No cervical or supraclavicular lymphadenopathy  Lungs: clear to auscultation bilaterally, no rales,  wheezes, or rhonci  Heart: regular rate and rhythm  Abdomen: round, soft, non tender, positive bowel sounds  Musculoskeletal: No focal spinal tenderness, no peripheral edema  Neuro: non focal, well oriented, positive affect  Breast: left breast status post lumpectomy and radiation. No evidence of recurrent disease. Left axilla benign. Right breast unremarkable.   LAB RESULTS:  CMP     Component Value Date/Time   NA 144 09/09/2014 1447   NA 142 08/01/2013 1535   K 4.4 09/09/2014 1447   K 4.4 08/01/2013 1535   CL 97 08/01/2013 1535   CO2 33* 09/09/2014 1447   CO2 31 08/01/2013 1535   GLUCOSE 131 09/09/2014 1447   GLUCOSE 103* 08/01/2013 1535   BUN 17.3 09/09/2014 1447   BUN 19 08/01/2013 1535   CREATININE 1.0 09/09/2014 1447   CREATININE 1.00 08/01/2013 1535   CALCIUM 10.3 09/09/2014 1447   CALCIUM 9.4 11/19/2013 1551   PROT 7.6 09/09/2014 1447   PROT 6.4 10/08/2011 0525   ALBUMIN 3.9 09/09/2014 1447   ALBUMIN 2.8* 10/08/2011 0525   AST 60* 09/09/2014 1447   AST 119* 10/08/2011 0525   ALT 81* 09/09/2014 1447   ALT 155* 10/08/2011 0525   ALKPHOS 101 09/09/2014 1447   ALKPHOS 151* 10/08/2011 0525   BILITOT 0.47 09/09/2014 1447   BILITOT 1.0 10/08/2011 0525   GFRNONAA 60* 08/01/2013 1535   GFRAA 69* 08/01/2013 1535    I No results found for: SPEP  Lab Results  Component Value Date   WBC 4.4 09/09/2014   NEUTROABS 2.3 09/09/2014   HGB 14.4 09/09/2014   HCT 43.6 09/09/2014   MCV 87.9 09/09/2014   PLT 268 09/09/2014      Chemistry      Component Value Date/Time   NA 144 09/09/2014 1447   NA 142 08/01/2013 1535   K 4.4 09/09/2014 1447   K 4.4 08/01/2013 1535   CL 97 08/01/2013 1535   CO2 33* 09/09/2014 1447   CO2 31 08/01/2013 1535   BUN 17.3 09/09/2014 1447   BUN 19 08/01/2013 1535   CREATININE 1.0 09/09/2014 1447   CREATININE 1.00 08/01/2013 1535      Component Value Date/Time  CALCIUM 10.3 09/09/2014 1447   CALCIUM 9.4 11/19/2013 1551   ALKPHOS  101 09/09/2014 1447   ALKPHOS 151* 10/08/2011 0525   AST 60* 09/09/2014 1447   AST 119* 10/08/2011 0525   ALT 81* 09/09/2014 1447   ALT 155* 10/08/2011 0525   BILITOT 0.47 09/09/2014 1447   BILITOT 1.0 10/08/2011 0525       No results found for: LABCA2  No components found for: WHQPR916  No results for input(s): INR in the last 168 hours.  Urinalysis    Component Value Date/Time   COLORURINE AMBER* 10/07/2011 0501   APPEARANCEUR CLOUDY* 10/07/2011 0501   LABSPEC 1.034* 10/07/2011 0501   PHURINE 5.5 10/07/2011 0501   GLUCOSEU NEGATIVE 10/07/2011 0501   HGBUR NEGATIVE 10/07/2011 0501   BILIRUBINUR SMALL* 10/07/2011 0501   KETONESUR TRACE* 10/07/2011 0501   PROTEINUR NEGATIVE 10/07/2011 0501   UROBILINOGEN 0.2 10/07/2011 0501   NITRITE NEGATIVE 10/07/2011 0501   LEUKOCYTESUR NEGATIVE 10/07/2011 0501    STUDIES: Most recent mammogram 07/13/14 on was unremarkable.   Most recent bone density scan on 10/29/13 showed a t-score of -2.2 (osteopenia)  ASSESSMENT: 64 y.o. Alyssa [name] woman status post left breast biopsy 07/21/2013 for a clinical T1 N0, stage IA invasive ductal carcinoma, grade 1, estrogen and progesterone receptor are both strongly positive, HER-2 not amplified, with an MIB-1 of 5%  (1) History of pulmonary embolism documented by CT angiography 10/07/2011 (9 days after ventral hernia repair); Dopplers 11-2011 showed no lower extremity DVTs; the patient received Coumadin for 10 months. Hypercoagulable  Workup performed in January 2015 showed negative LAC, anti-cardiolipin Abs, negative to borderline beta-2-glycoprotein Abs, AT, protein C and S, prothrombin II gene mutation and Factor V Leiden  (2) status post left lumpectomy and sentinel lymph node sampling 08/04/2013 for a pT1c pN0, stage IA invasive ductal carcinoma, grade 1, with repeat HER-2 again negative.  (3) Oncotype score of 14 predicts a risk of outside the breast recurrence within 10 years of 9% if the  patient's only systemic therapy is tamoxifen for 5 years. Also predicts no benefit from chemotherapy.  (4) Adjuvant Radiation therapy  Started on 09/09/2013 completed on 10/13/2013  (5) Adjuvant Aromatase  inhibitor therapy with Letrozole : start date-11/19/2013  (6)  Letrozole was discontinued 12/03/2013 secondary to allergic reaction (rash)  (7) anastrozole start date:  12/05/2013  (8) hot flashes   (9) osteopenia with a T score of -2.2 on bone density 10/29/2013  PLAN: Alyssa [name] is doing well today. The labs were reviewed in detail and her AST and ALT values continue to trend upward, almost double the level of her last visit. She has noticed this trend with the labs obtained from her PCP's office as well. She suspects fatty liver disease, but has not had this confirmed, so I have placed orders for a liver ultrasound to be performed in the next week or so.   We discussed remedies for her hot flashes as the venlafaxine did not make her feel like herself. She was uninterested in the gabapentin due to drowsiness being a side effect. Eventually we settled on clonidine TTS-1 patches. Her blood pressure tends to run in the 140s/90s even while on HCTZ so hypotension is less of a concern. She will try these out for a month to see if they are effective.   Alyssa [name]'s most recent bone density scan shows moderate osteopenia, so we discussed bisphosphonate therapy. She is interested in trying zometa once yearly, but wants to discuss the drug with her  dentist. She visits him every 3 months because of prior jaw structural issues, though her teeth are in good repair. We will tentatively set an appointment for late March, and she will call and cancel if she decides against it.   I have prescribed her tramadol for her knee and foot pain, and refilled her anastrozole during this visit.  Alyssa [name] will return in 6 months for labs and a follow up visit. She understands and agrees with this plan. She knows the goal of  treatment in her case is cure. She has been encouraged to call with any issues that might arise before her next visit here.   Marcelino Duster, NP   09/09/2014 4:44 PM

## 2014-09-13 ENCOUNTER — Other Ambulatory Visit: Payer: Self-pay | Admitting: Physician Assistant

## 2014-09-17 ENCOUNTER — Ambulatory Visit (HOSPITAL_COMMUNITY)
Admission: RE | Admit: 2014-09-17 | Discharge: 2014-09-17 | Disposition: A | Discharge status: Home / Self Care | Payer: BC Managed Care – PPO | Source: Ambulatory Visit | Attending: Nurse Practitioner | Admitting: Nurse Practitioner

## 2014-09-17 DIAGNOSIS — R7989 Other specified abnormal findings of blood chemistry: Secondary | ICD-10-CM | POA: Insufficient documentation

## 2014-09-17 DIAGNOSIS — K760 Fatty (change of) liver, not elsewhere classified: Secondary | ICD-10-CM | POA: Diagnosis not present

## 2014-09-17 DIAGNOSIS — R7401 Elevation of levels of liver transaminase levels: Secondary | ICD-10-CM

## 2014-09-17 DIAGNOSIS — R740 Nonspecific elevation of levels of transaminase and lactic acid dehydrogenase [LDH]: Secondary | ICD-10-CM

## 2014-09-17 DIAGNOSIS — Z853 Personal history of malignant neoplasm of breast: Secondary | ICD-10-CM | POA: Insufficient documentation

## 2014-09-17 DIAGNOSIS — K769 Liver disease, unspecified: Secondary | ICD-10-CM | POA: Diagnosis not present

## 2014-09-18 ENCOUNTER — Telehealth: Payer: Self-pay | Admitting: *Deleted

## 2014-09-18 NOTE — Telephone Encounter (Signed)
Called pt to inform her that the [place] of abdomen  showed no mets or lesions . Explained to pt that she possibly has a fatty liver. We talked about what a fatty liver is and some things she can do to help like exercising at least 30 minutes a day, eating a lowfat and low cholesterol diet. Pt does not drink alcoholic beverages on a regular basis. Pt does have a hx of elevated cholesterol but no longer takes prescribed medication for it b/c of side effects in the past. Pt verbalized understanding all we talked about and said she will work on some of these suggestions. Message to be forwarded to [facility].

## 2014-10-19 ENCOUNTER — Other Ambulatory Visit: Payer: Self-pay | Admitting: Nurse Practitioner

## 2014-10-19 ENCOUNTER — Other Ambulatory Visit (HOSPITAL_BASED_OUTPATIENT_CLINIC_OR_DEPARTMENT_OTHER): Payer: BC Managed Care – PPO

## 2014-10-19 ENCOUNTER — Ambulatory Visit: Payer: Self-pay

## 2014-10-19 DIAGNOSIS — C50212 Malignant neoplasm of upper-inner quadrant of left female breast: Secondary | ICD-10-CM | POA: Diagnosis not present

## 2014-10-19 LAB — CBC WITH DIFFERENTIAL/PLATELET
BASO%: 0.4 % (ref 0.0–2.0)
Basophils Absolute: 0 10*3/uL (ref 0.0–0.1)
EOS ABS: 0.1 10*3/uL (ref 0.0–0.5)
EOS%: 3 % (ref 0.0–7.0)
HCT: 44.6 % (ref 34.8–46.6)
HEMOGLOBIN: 14.6 g/dL (ref 11.6–15.9)
LYMPH#: 1.8 10*3/uL (ref 0.9–3.3)
LYMPH%: 37.8 % (ref 14.0–49.7)
MCH: 28.6 pg (ref 25.1–34.0)
MCHC: 32.7 g/dL (ref 31.5–36.0)
MCV: 87.3 fL (ref 79.5–101.0)
MONO#: 0.5 10*3/uL (ref 0.1–0.9)
MONO%: 9.9 % (ref 0.0–14.0)
NEUT%: 48.9 % (ref 38.4–76.8)
NEUTROS ABS: 2.3 10*3/uL (ref 1.5–6.5)
Platelets: 199 10*3/uL (ref 145–400)
RBC: 5.11 10*6/uL (ref 3.70–5.45)
RDW: 13.2 % (ref 11.2–14.5)
WBC: 4.6 10*3/uL (ref 3.9–10.3)

## 2014-10-19 LAB — COMPREHENSIVE METABOLIC PANEL (CC13)
ALT: 97 U/L — ABNORMAL HIGH (ref 0–55)
AST: 59 U/L — ABNORMAL HIGH (ref 5–34)
Albumin: 3.8 g/dL (ref 3.5–5.0)
Alkaline Phosphatase: 80 U/L (ref 40–150)
Anion Gap: 12 mEq/L — ABNORMAL HIGH (ref 3–11)
BILIRUBIN TOTAL: 0.72 mg/dL (ref 0.20–1.20)
BUN: 12.8 mg/dL (ref 7.0–26.0)
CO2: 29 mEq/L (ref 22–29)
Calcium: 9.8 mg/dL (ref 8.4–10.4)
Chloride: 102 mEq/L (ref 98–109)
Creatinine: 0.8 mg/dL (ref 0.6–1.1)
EGFR: 79 mL/min/{1.73_m2} — AB (ref >90)
GLUCOSE: 101 mg/dL (ref 70–140)
Potassium: 4 mEq/L (ref 3.5–5.1)
Sodium: 143 mEq/L (ref 136–145)
Total Protein: 7.2 g/dL (ref 6.4–8.3)

## 2014-10-19 NOTE — Progress Notes (Signed)
Pt in for zometa today.  Pt has long history of jaw and dental issues.  No where in chart is it documented that she has been cleared by a dentist to receive zometa.  Pt stated that her dentist said that it was fine she was clear to receive it but did not have written clearance.  Gentry Fitz NP pt has to have written notice from dentist that she is cleared to receive zometa.  Pt instructed to get written OK from her dentist and to reschedule when she has obtained notice.  SHK

## 2014-10-21 ENCOUNTER — Telehealth: Payer: Self-pay | Admitting: *Deleted

## 2014-10-21 ENCOUNTER — Telehealth: Payer: Self-pay | Admitting: Oncology

## 2014-10-21 NOTE — Telephone Encounter (Signed)
pt cld stating she was denied zometa last week because she had no note from Dentist. Pt stated has note and wanted zometa r/s-per Nira Conn ok to sch out 2 weeks-sent Mw email to sch appt-adv py I will call once sch

## 2014-10-21 NOTE — Telephone Encounter (Signed)
Per staff message and POF I have scheduled appts. Advised scheduler of appts. JMW  

## 2014-10-22 ENCOUNTER — Telehealth: Payer: Self-pay | Admitting: *Deleted

## 2014-10-22 NOTE — Telephone Encounter (Signed)
Per staff message I have moved appt

## 2014-10-23 ENCOUNTER — Ambulatory Visit: Payer: Self-pay

## 2014-10-26 ENCOUNTER — Telehealth: Payer: Self-pay | Admitting: Oncology

## 2014-10-26 NOTE — Telephone Encounter (Signed)
cld pt to adv the zometa appt had been r/s-left message of time & date

## 2014-11-04 ENCOUNTER — Other Ambulatory Visit: Payer: Self-pay | Admitting: *Deleted

## 2014-11-04 DIAGNOSIS — C50212 Malignant neoplasm of upper-inner quadrant of left female breast: Secondary | ICD-10-CM

## 2014-11-05 ENCOUNTER — Ambulatory Visit (HOSPITAL_BASED_OUTPATIENT_CLINIC_OR_DEPARTMENT_OTHER): Payer: BC Managed Care – PPO

## 2014-11-05 ENCOUNTER — Other Ambulatory Visit (HOSPITAL_BASED_OUTPATIENT_CLINIC_OR_DEPARTMENT_OTHER): Payer: BC Managed Care – PPO

## 2014-11-05 ENCOUNTER — Telehealth: Payer: Self-pay

## 2014-11-05 VITALS — BP 159/69 | HR 76 | Temp 98.4°F | Resp 19

## 2014-11-05 DIAGNOSIS — C50212 Malignant neoplasm of upper-inner quadrant of left female breast: Secondary | ICD-10-CM | POA: Diagnosis not present

## 2014-11-05 LAB — COMPREHENSIVE METABOLIC PANEL (CC13)
ALK PHOS: 84 U/L (ref 40–150)
ALT: 127 U/L — ABNORMAL HIGH (ref 0–55)
ANION GAP: 12 meq/L — AB (ref 3–11)
AST: 99 U/L — ABNORMAL HIGH (ref 5–34)
Albumin: 3.9 g/dL (ref 3.5–5.0)
BILIRUBIN TOTAL: 0.77 mg/dL (ref 0.20–1.20)
BUN: 19.1 mg/dL (ref 7.0–26.0)
CALCIUM: 9.5 mg/dL (ref 8.4–10.4)
CO2: 28 mEq/L (ref 22–29)
CREATININE: 0.8 mg/dL (ref 0.6–1.1)
Chloride: 101 mEq/L (ref 98–109)
EGFR: 85 mL/min/{1.73_m2} — AB (ref >90)
GLUCOSE: 90 mg/dL (ref 70–140)
POTASSIUM: 3.4 meq/L — AB (ref 3.5–5.1)
Sodium: 141 mEq/L (ref 136–145)
TOTAL PROTEIN: 7.3 g/dL (ref 6.4–8.3)

## 2014-11-05 LAB — CBC WITH DIFFERENTIAL/PLATELET
BASO%: 1.1 % (ref 0.0–2.0)
Basophils Absolute: 0.1 10*3/uL (ref 0.0–0.1)
EOS%: 2.4 % (ref 0.0–7.0)
Eosinophils Absolute: 0.1 10*3/uL (ref 0.0–0.5)
HCT: 44.6 % (ref 34.8–46.6)
HGB: 14.3 g/dL (ref 11.6–15.9)
LYMPH%: 39.1 % (ref 14.0–49.7)
MCH: 27.2 pg (ref 25.1–34.0)
MCHC: 32 g/dL (ref 31.5–36.0)
MCV: 84.8 fL (ref 79.5–101.0)
MONO#: 0.4 10*3/uL (ref 0.1–0.9)
MONO%: 8.3 % (ref 0.0–14.0)
NEUT#: 2.5 10*3/uL (ref 1.5–6.5)
NEUT%: 49.1 % (ref 38.4–76.8)
Platelets: 223 10*3/uL (ref 145–400)
RBC: 5.26 10*6/uL (ref 3.70–5.45)
RDW: 14.6 % — ABNORMAL HIGH (ref 11.2–14.5)
WBC: 5.1 10*3/uL (ref 3.9–10.3)
lymph#: 2 10*3/uL (ref 0.9–3.3)

## 2014-11-05 MED ORDER — ZOLEDRONIC ACID 4 MG/100ML IV SOLN
4.0000 mg | Freq: Once | INTRAVENOUS | Status: AC
  Administered 2014-11-05: 4 mg via INTRAVENOUS
  Filled 2014-11-05: qty 100

## 2014-11-05 MED ORDER — SODIUM CHLORIDE 0.9 % IV SOLN
Freq: Once | INTRAVENOUS | Status: AC
  Administered 2014-11-05: 15:00:00 via INTRAVENOUS

## 2014-11-05 NOTE — Telephone Encounter (Signed)
Pt brought letter from her dentist for permission to administer zometa

## 2014-11-05 NOTE — Progress Notes (Signed)
Pt brought release from her dentist for administering zometa

## 2014-11-05 NOTE — Patient Instructions (Signed)

## 2014-12-03 ENCOUNTER — Telehealth: Payer: Self-pay | Admitting: *Deleted

## 2014-12-03 NOTE — Telephone Encounter (Signed)
TC from patient stating she feels terrible...experiencing a lot of swelling in feet, face and hands, aches all over. Feel just awful. She thinks it might be d/t anastrozole and she stopped that 2 days ago. Also had Zometa in April and has felt worse since then too.  Desires call back from Val, RN  With Dr. Jana Hakim    Made Val aware of pt call.

## 2014-12-03 NOTE — Telephone Encounter (Signed)
This RN spoke with pt.  Per discussion she will continue to hold the anastrazole.  She will push fluids and monitor symptoms since stopping above medication.  An appointment will be made for follow up to discuss other medications of benefit.  No other needs at this time.

## 2015-02-07 NOTE — Progress Notes (Signed)
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 Left diagnostic mammography and ultrasonography 07/21/2013 showed an irregular high-density mass in the upper inner quadrant of the left breast which was palpable at the 11:00 position 5 cm from the nipple. Ultrasound confirmed an irregular hypoechoic mass measuring 1.1 cm. The left axilla was unremarkable.  Biopsy of this mass was obtained the same day and showed ([id number]) an invasive ductal carcinoma, grade 1, estrogen and progesterone receptor both strongly positive by verbal report, with no HER-2 amplification (signals ratio 1.19, number per cell 1.85) and an MIB-1 of 5%.  The patient's subsequent history is as detailed below.  INTERVAL HISTORY: Alyssa [name] returns today for follow up of her breast cancer. Although she originally tolerated tamoxifen moderately well, she has developed significant problems and finally stopped it as of May 10 of this year. Specifically she was having shortness of breath, swelling of her feet, swelling around her eyes, feeling tired all the time, and having arthralgias and myalgias. After being off for while she was less tired and not much less swelling, although recently she has been feeling a little bit more tired again. The other symptoms have not recurred.  REVIEW OF SYSTEMS: Alyssa [name] has been having some morning headaches. She takes mild analgesics for this when they occur. She has pain in her abdomen which feels to  her like she has dyspnea 8 moving from one side to the other. She hasn't lost any weight. She has not had any change in her bowel movements. She denies nausea or vomiting. She does have heartburn. She has back and joint pain still, but denies knee pain. She still having some hot flashes although they are better. A detailed review of systems today was otherwise stable  PAST MEDICAL HISTORY: Past Medical History  Diagnosis Date  . Hyperlipidemia   . Thyroid disease   . Abdominal pain   . Post concussion syndrome   . Ventral hernia   . Hypertension     PCP DR Nolon Rod  . Recurrent upper respiratory infection (URI)     FLU 08/23/11-08/29/11 then URI 08/29/11- 09/08/11- S/P zpack and steroids-  improved      no cough or congestion  . Hypothyroidism   . Post concussion syndrome     4 yrs ago- sees [facility]- Dr Leonie Man every 6 months-   has sleep study 2011 following accident but was neg per patient-  short term memory issues, dates and times  . Headache(784.0)   . Hernia, incisional periumbilical, incarcerated 10/30/1446  . Thyroid mass, left inferior lobe, 3.6cm [id number] 10/07/2011  . Pulmonary embolism   . Hot flashes   . Peripheral vascular disease     pulmonary embolis-still have some  . Cancer     breast  . Breast cancer 06/2013    left upper inner  . 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 Cholecystectomy  2006    lap chole, Dr. Bubba Camp  . Appendectomy  2002    lap appy.  Dr. Hassell Done  . Carpel tunnel      left in April 2011, right in June 2012  . Tarsal tunnel release      bilateral  . Cesarean section      x2  . Hernia repair  09/15/11    Ventral w/mesh  . Ventral hernia repair  09/29/2011    Procedure: LAPAROSCOPIC VENTRAL HERNIA;  Surgeon: Adin Hector, MD;   Location: [facility];  Service: General;  Laterality: N/A;  Laparoscopic Ventral Wall Hernia Repair with Mesh  . Breast lumpectomy with needle localization and axillary sentinel lymph node bx Left 08/04/2013    Procedure: BREAST LUMPECTOMY WITH NEEDLE LOCALIZATION AND AXILLARY SENTINEL LYMPH NODE Biopsy x 3;  Surgeon: Rolm Bookbinder, MD;  Location: [facility];  Service: General;  Laterality: Left;    FAMILY HISTORY Family History  Problem Relation Age of Onset  . Malignant hyperthermia Mother   . Heart attack Father   . Stroke Father   . Diabetes      Grandmother  . Other      respiratory- Grandmother  . Cancer Maternal Grandmother 96    breast   patient's father died at the age of 67 from a myocardial infarction. The patient's mother, Alyssa [name], is alive at 34. She lives with the patient, and is very independent (drives, etc.) The patient has 2 brothers, no sisters. The patient's mother is mother was diagnosed with breast cancer at the age of 30. There is no history of breast or ovarian cancer in the family (there is a history of cervical cancer in the patient's daughter).  GYNECOLOGIC HISTORY:  Menarche age 59, first live birth age 56, the patient is Alyssa [name]. She stopped having periods approximately 1999 and took hormone replacements until 2005.  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 She is divorced and lives with her mother Alyssa [name]. Daughter Alyssa [name] is a business woman living in [place]. Son Alyssa [name] lives in [place], also in business. The patient has 3 grandchildren. She attends [employer]    ADVANCED DIRECTIVES: Not in place   HEALTH MAINTENANCE: History  Substance Use Topics  . Smoking status: Never Smoker   . Smokeless tobacco: Never Used  . Alcohol Use: No     Colonoscopy: Never  PAP:  Bone density: Never  Lipid panel:  Allergies  Allergen Reactions  . Aleve [Naproxen Sodium] Hives, Itching and  Swelling  . Letrozole Shortness Of Breath, Rash and Other (See Comments)    Swelling - feet, eyes,hands;  Speech garbled  . Codeine Other (See Comments)    hallucinations  . Other Swelling    Swelling of  Eyes   Wheat ,oranges banana, zucchini,cayenne,chili  Peppers, sweet potatoes, pumpkin  . Penicillins Hives, Swelling and Rash    All over the body.  Marland Kitchen Percocet [Oxycodone-Acetaminophen] Other (See Comments)    Causes syncope day after medication has been taken.  . Vicodin [Hydrocodone-Acetaminophen] Other (See Comments)    Causes syncope day after medication has been taken.    Current Outpatient Prescriptions  Medication Sig Dispense Refill  . ALPRAZolam (XANAX) 0.5 MG tablet Take 0.5 mg by mouth at bedtime as needed.     . cetirizine (ZYRTEC) 10 MG tablet Take 10 mg by mouth every evening.     Marland Kitchen  Cholecalciferol (VITAMIN D3) 5000 UNITS CAPS Take 1 capsule by mouth daily.    . cloNIDine (CATAPRES - DOSED IN MG/24 HR) 0.1 mg/24hr patch Place 1 patch (0.1 mg total) onto the skin once a week. 4 patch 5  . divalproex (DEPAKOTE ER) 500 MG 24 hr tablet Take 1 tablet (500 mg total) by mouth at bedtime. 90 tablet 3  . exemestane (AROMASIN) 25 MG tablet Take 1 tablet (25 mg total) by mouth daily after breakfast. 90 tablet 4  . hydrochlorothiazide (HYDRODIURIL) 25 MG tablet Take 25 mg by mouth at bedtime.     Marland Kitchen NATURE-THROID 97.5 MG TABS Take 97.5 mg by mouth 2 (two) times daily.    . traMADol (ULTRAM) 50 MG tablet Take 1 tablet (50 mg total) by mouth every 6 (six) hours as needed. 120 tablet 0  . traZODone (DESYREL) 50 MG tablet Take 50 mg by mouth at bedtime.     No current facility-administered medications for this visit.    OBJECTIVE: Middle-aged white woman who appears younger than stated age 11 Vitals:   02/09/15 1356  BP: 159/75  Pulse: 70  Temp: 97.9 F (36.6 C)  Resp: 18     Body mass index is 42.08 kg/(m^2).    ECOG FS:1 - Symptomatic but completely ambulatory  Sclerae  unicteric, pupils round and equal Oropharynx clear and moist-- no thrush or other lesions No cervical or supraclavicular adenopathy Lungs no rales or rhonchi Heart regular rate and rhythm Abd soft, obese, nontender, positive bowel sounds, no masses palpated MSK no focal spinal tenderness, no upper extremity lymphedema Neuro: nonfocal, well oriented, appropriate affect Breasts: The right breast is unremarkable. The left breast is status post lumpectomy and radiation. There is no evidence of local recurrence. The left axilla is benign.   LAB RESULTS:  CMP     Component Value Date/Time   NA 141 02/09/2015 1345   NA 142 08/01/2013 1535   K 3.8 02/09/2015 1345   K 4.4 08/01/2013 1535   CL 97 08/01/2013 1535   CO2 30* 02/09/2015 1345   CO2 31 08/01/2013 1535   GLUCOSE 94 02/09/2015 1345   GLUCOSE 103* 08/01/2013 1535   BUN 13.5 02/09/2015 1345   BUN 19 08/01/2013 1535   CREATININE 0.8 02/09/2015 1345   CREATININE 1.00 08/01/2013 1535   CALCIUM 9.7 02/09/2015 1345   CALCIUM 9.4 11/19/2013 1551   PROT 6.9 02/09/2015 1345   PROT 6.4 10/08/2011 0525   ALBUMIN 3.6 02/09/2015 1345   ALBUMIN 2.8* 10/08/2011 0525   AST 148* 02/09/2015 1345   AST 119* 10/08/2011 0525   ALT 160* 02/09/2015 1345   ALT 155* 10/08/2011 0525   ALKPHOS 74 02/09/2015 1345   ALKPHOS 151* 10/08/2011 0525   BILITOT 0.97 02/09/2015 1345   BILITOT 1.0 10/08/2011 0525   GFRNONAA 60* 08/01/2013 1535   GFRAA 69* 08/01/2013 1535    I No results found for: SPEP  Lab Results  Component Value Date   WBC 4.2 02/09/2015   NEUTROABS 2.0 02/09/2015   HGB 14.0 02/09/2015   HCT 43.1 02/09/2015   MCV 86.5 02/09/2015   PLT 222 02/09/2015      Chemistry      Component Value Date/Time   NA 141 02/09/2015 1345   NA 142 08/01/2013 1535   K 3.8 02/09/2015 1345   K 4.4 08/01/2013 1535   CL 97 08/01/2013 1535   CO2 30* 02/09/2015 1345   CO2 31 08/01/2013 1535   BUN 13.5 02/09/2015  1345   BUN 19 08/01/2013 1535    CREATININE 0.8 02/09/2015 1345   CREATININE 1.00 08/01/2013 1535      Component Value Date/Time   CALCIUM 9.7 02/09/2015 1345   CALCIUM 9.4 11/19/2013 1551   ALKPHOS 74 02/09/2015 1345   ALKPHOS 151* 10/08/2011 0525   AST 148* 02/09/2015 1345   AST 119* 10/08/2011 0525   ALT 160* 02/09/2015 1345   ALT 155* 10/08/2011 0525   BILITOT 0.97 02/09/2015 1345   BILITOT 1.0 10/08/2011 0525       No results found for: LABCA2  No components found for: [id number]  No results for input(s): INR in the last 168 hours.  Urinalysis    Component Value Date/Time   COLORURINE AMBER* 10/07/2011 0501   APPEARANCEUR CLOUDY* 10/07/2011 0501   LABSPEC 1.034* 10/07/2011 0501   PHURINE 5.5 10/07/2011 0501   GLUCOSEU NEGATIVE 10/07/2011 0501   HGBUR NEGATIVE 10/07/2011 0501   BILIRUBINUR SMALL* 10/07/2011 0501   KETONESUR TRACE* 10/07/2011 0501   PROTEINUR NEGATIVE 10/07/2011 0501   UROBILINOGEN 0.2 10/07/2011 0501   NITRITE NEGATIVE 10/07/2011 0501   LEUKOCYTESUR NEGATIVE 10/07/2011 0501    STUDIES: CLINICAL DATA: Elevated liver function tests. Personal history of breast carcinoma.  EXAM: US ABDOMEN LIMITED - RIGHT UPPER QUADRANT  COMPARISON: 06/08/2005  FINDINGS: Gallbladder:  Surgically absent.  Common bile duct:  Diameter: 5 mm, which is within normal limits.  Liver:  Diffusely increased echogenicity of the hepatic parenchyma, consistent with diffuse hepatic steatosis/hepatocellular disease. No focal mass lesion identified.  IMPRESSION: Prior cholecystectomy. No evidence of biliary ductal dilatation.  Hepatic steatosis/diffuse hepatocellular disease, which is increased since previous study in 2006.   Electronically Signed  By: Earle Gell M.D.  On: 09/17/2014 08:33   Most recent bone density scan on 10/29/13 showed a t-score of -2.2 (osteopenia)  ASSESSMENT: 64 y.o. [place] woman status post left breast biopsy 07/21/2013 for a clinical T1  N0, stage IA invasive ductal carcinoma, grade 1, estrogen and progesterone receptor strongly positive, HER-2 not amplified, with an MIB-1 of 5%  (1) History of pulmonary embolism documented by CT angiography 10/07/2011 (9 days after ventral hernia repair); Dopplers 11-2011 showed no lower extremity DVTs; the patient received Coumadin for 10 months. Hypercoagulable  Workup performed in January 2015 showed negative LAC, anti-cardiolipin Abs, negative to borderline beta-2-glycoprotein Abs, AT, protein C and S, prothrombin II gene mutation and Factor V Leiden  (2) status post left lumpectomy and sentinel lymph node sampling 08/04/2013 for a pT1c pN0, stage IA invasive ductal carcinoma, grade 1, with repeat HER-2 again negative.  (3) Oncotype score of 14 predicts a risk of outside the breast recurrence within 10 years of 9% if the patient's only systemic therapy is tamoxifen for 5 years. Also predicts no benefit from chemotherapy.  (4) Adjuvant Radiation therapy started on 09/09/2013 completed on 10/13/2013  (5) Adjuvant Aromatase  inhibitor therapy with Letrozole  start date-11/19/2013  (a) Letrozole was discontinued 12/03/2013 secondary to allergic reaction (rash)  (b) anastrozole start date:  12/05/2013, discontinued 12/01/2014 due to side effects  (6) osteopenia with a T score of -2.2 on bone density 10/29/2013  (a) zolendronate started 11/05/2014  PLAN: Jaquita gave the anastrozole a good try, but the side effects really did become a problem for her and she does feel better off that medication.  Accordingly we went back to [place] [facility]. We discussed the rationale for her taking anti-estrogens. I quoted her risk of recurrence outside the breast without taking anti-estrogens in  the 18% range. If she manages to take 5 years of aromatase inhibitors would drop to something like 6 or 7%.  We could try tamoxifen but only under cover of anticoagulants. She had a pulmonary embolus previously. She would be at  risk of clotting and bleeding. In addition she tried tamoxifen originally and had a rash with it. Accordingly tamoxifen is not going to be a good choice for her.  She has had both nonsteroidal aromatase inhibitors. She might be able to tolerate this to [place] [facility], namely exemestane. Potentially could have exactly the same side effects she has been experiencing but frequently patients tolerate this drug better even if they have not tolerated anastrozole or letrozole well.  The problems I have encountered with exemestane are cost. She does have very good insurance and this is a generic drug. Hopefully she will be able to obtain it at a good price.  I went ahead and placed the prescription. She will call me if cost is an issue or if any other side effects develop.  As far as her abdominal discomfort is concerned, I don't have a simple explanation. He doesn't sound like gastritis, reflux, or colitis. She has normal bowel movements, and has not lost weight. She does not have nausea or vomiting problems. She had an unremarkable ultrasound of the abdomen a few months ago except for steatosis. I think she would be best discussing this issue with Dr. Collene Mares and she will be calling her office for that purpose.  Otherwise Kinzy will return to see me early January. She knows to call for any problems that may develop before that visit.  Chauncey Cruel, MD   02/09/2015 2:44 PM

## 2015-02-08 ENCOUNTER — Other Ambulatory Visit: Payer: Self-pay | Admitting: *Deleted

## 2015-02-08 DIAGNOSIS — C50212 Malignant neoplasm of upper-inner quadrant of left female breast: Secondary | ICD-10-CM

## 2015-02-09 ENCOUNTER — Telehealth: Payer: Self-pay | Admitting: Oncology

## 2015-02-09 ENCOUNTER — Other Ambulatory Visit (HOSPITAL_BASED_OUTPATIENT_CLINIC_OR_DEPARTMENT_OTHER): Payer: BC Managed Care – PPO

## 2015-02-09 ENCOUNTER — Ambulatory Visit (HOSPITAL_BASED_OUTPATIENT_CLINIC_OR_DEPARTMENT_OTHER): Payer: BC Managed Care – PPO | Admitting: Oncology

## 2015-02-09 VITALS — BP 159/75 | HR 70 | Temp 97.9°F | Resp 18 | Ht 62.25 in | Wt 231.9 lb

## 2015-02-09 DIAGNOSIS — C50212 Malignant neoplasm of upper-inner quadrant of left female breast: Secondary | ICD-10-CM

## 2015-02-09 DIAGNOSIS — K760 Fatty (change of) liver, not elsewhere classified: Secondary | ICD-10-CM | POA: Diagnosis not present

## 2015-02-09 DIAGNOSIS — Z170 Estrogen receptor positive status [ER+]: Secondary | ICD-10-CM | POA: Diagnosis not present

## 2015-02-09 DIAGNOSIS — Z79811 Long term (current) use of aromatase inhibitors: Secondary | ICD-10-CM | POA: Diagnosis not present

## 2015-02-09 LAB — COMPREHENSIVE METABOLIC PANEL (CC13)
ALBUMIN: 3.6 g/dL (ref 3.5–5.0)
ALT: 160 U/L — AB (ref 0–55)
AST: 148 U/L — ABNORMAL HIGH (ref 5–34)
Alkaline Phosphatase: 74 U/L (ref 40–150)
Anion Gap: 10 mEq/L (ref 3–11)
BUN: 13.5 mg/dL (ref 7.0–26.0)
CHLORIDE: 101 meq/L (ref 98–109)
CO2: 30 mEq/L — ABNORMAL HIGH (ref 22–29)
Calcium: 9.7 mg/dL (ref 8.4–10.4)
Creatinine: 0.8 mg/dL (ref 0.6–1.1)
EGFR: 83 mL/min/{1.73_m2} — ABNORMAL LOW (ref >90)
Glucose: 94 mg/dl (ref 70–140)
Potassium: 3.8 mEq/L (ref 3.5–5.1)
SODIUM: 141 meq/L (ref 136–145)
Total Bilirubin: 0.97 mg/dL (ref 0.20–1.20)
Total Protein: 6.9 g/dL (ref 6.4–8.3)

## 2015-02-09 LAB — CBC WITH DIFFERENTIAL/PLATELET
BASO%: 1.1 % (ref 0.0–2.0)
BASOS ABS: 0 10*3/uL (ref 0.0–0.1)
EOS%: 2.8 % (ref 0.0–7.0)
Eosinophils Absolute: 0.1 10*3/uL (ref 0.0–0.5)
HCT: 43.1 % (ref 34.8–46.6)
HEMOGLOBIN: 14 g/dL (ref 11.6–15.9)
LYMPH#: 1.6 10*3/uL (ref 0.9–3.3)
LYMPH%: 38.8 % (ref 14.0–49.7)
MCH: 28.1 pg (ref 25.1–34.0)
MCHC: 32.5 g/dL (ref 31.5–36.0)
MCV: 86.5 fL (ref 79.5–101.0)
MONO#: 0.4 10*3/uL (ref 0.1–0.9)
MONO%: 9.2 % (ref 0.0–14.0)
NEUT#: 2 10*3/uL (ref 1.5–6.5)
NEUT%: 48.1 % (ref 38.4–76.8)
PLATELETS: 222 10*3/uL (ref 145–400)
RBC: 4.99 10*6/uL (ref 3.70–5.45)
RDW: 15 % — ABNORMAL HIGH (ref 11.2–14.5)
WBC: 4.2 10*3/uL (ref 3.9–10.3)

## 2015-02-09 MED ORDER — EXEMESTANE 25 MG PO TABS: 25.0000 mg | ORAL_TABLET | Freq: Every day | ORAL | Status: DC

## 2015-02-09 NOTE — Telephone Encounter (Signed)
Gave avs & calendar for January

## 2015-02-10 ENCOUNTER — Telehealth: Payer: Self-pay | Admitting: *Deleted

## 2015-02-10 ENCOUNTER — Other Ambulatory Visit: Payer: Self-pay | Admitting: *Deleted

## 2015-02-10 MED ORDER — TRAMADOL HCL 50 MG PO TABS: 50.0000 mg | ORAL_TABLET | Freq: Four times a day (QID) | ORAL | Status: DC | PRN

## 2015-02-10 NOTE — Telephone Encounter (Signed)
"  I was there yesterday and forgot to ask for refill on Tramadol.  could this be called in to [facility] in [place]?  Call [phone] so I'll know it's done."

## 2015-02-11 ENCOUNTER — Other Ambulatory Visit: Payer: Self-pay | Admitting: *Deleted

## 2015-02-11 NOTE — Telephone Encounter (Signed)
Faxed prescription for tramadol refill to [facility] per med list.

## 2015-02-23 ENCOUNTER — Other Ambulatory Visit: Payer: Self-pay

## 2015-02-23 ENCOUNTER — Ambulatory Visit: Payer: Self-pay | Admitting: Oncology

## 2015-05-11 ENCOUNTER — Encounter: Payer: Self-pay | Admitting: Nurse Practitioner

## 2015-06-02 ENCOUNTER — Other Ambulatory Visit: Payer: Self-pay | Admitting: Oncology

## 2015-06-03 ENCOUNTER — Other Ambulatory Visit: Payer: Self-pay

## 2015-06-03 NOTE — Telephone Encounter (Signed)
Called Tramadol into patient's pharmacy per Dr. Jana Hakim.

## 2015-06-29 ENCOUNTER — Ambulatory Visit (INDEPENDENT_AMBULATORY_CARE_PROVIDER_SITE_OTHER): Payer: BC Managed Care – PPO | Admitting: Nurse Practitioner

## 2015-06-29 ENCOUNTER — Encounter: Payer: Self-pay | Admitting: Nurse Practitioner

## 2015-06-29 VITALS — BP 146/81 | HR 84 | Ht 62.0 in | Wt 241.6 lb

## 2015-06-29 DIAGNOSIS — R519 Headache, unspecified: Secondary | ICD-10-CM

## 2015-06-29 DIAGNOSIS — R51 Headache: Secondary | ICD-10-CM

## 2015-06-29 DIAGNOSIS — R413 Other amnesia: Secondary | ICD-10-CM

## 2015-06-29 DIAGNOSIS — F0781 Postconcussional syndrome: Secondary | ICD-10-CM

## 2015-06-29 MED ORDER — DIVALPROEX SODIUM ER 500 MG PO TB24: 500.0000 mg | ORAL_TABLET | Freq: Every day | ORAL | Status: DC

## 2015-06-29 NOTE — Patient Instructions (Signed)
Continue Depakote as ordered will refill for 1 year Memory score remains stable F/U yearly

## 2015-06-29 NOTE — Progress Notes (Signed)
[facility]  PATIENT: Alyssa [name] DOB: Jan 08, 1951   REASON FOR VISIT: Follow-up for memory loss, post concussion syndrome HISTORY FROM: Patient    HISTORY OF PRESENT ILLNESS:Alyssa [name], 64 year old female returns for followup. She was last seen in the office 07/14/2014. Patient fell off of the chair at school back in June2015 and hit her head. CT of the head at that time without abnormalities her headaches returned and she started taking her Depakote again with good benefit. Her memory is stable per MMSE. 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 She has an additional job working at [employer] for the holiday season.  She has not had any issues performing either of her jobs. She had breast cancer surgery January 2015. She returns for reevaluation and refills   HISTORY: of cognitive changes post head injury. She was initially evaluated by Dr. Leonie Man in September of 2009. She was operating a push mower when she was going down a ditch in front of her home and toppled over the handle and hit her head on the motor of the mower. At that time she was not sure she loss consciousness or not,she was able to get up go indoors. She did not seek medical help that day, however she did see her primary care the next day when she was complaining of severe headache, appeared to be confused and disoriented and did not recognize the Dr. CT of the head showed no acute abnormality. She has since had an MRI of the brain which was read as normal as well as an EEG that was normal. She returns today continuing to complain with short-term memory, attention, and concentration difficulties but she continues to work and she has not had any difficulty performing her job duties. She says she manages by using sticky notes, so that she will not forget things. She denies any depression ,she is trying  to exercise and has lost weight since last seen. She does complain of early morning headaches and fatigue, does not feel rested after sleeping although that is better with Trazodone. She is currently on Depakote 500 daily 2 daily for headaches.Her headaches have returned since she is back to teaching after being off for the summer. She says these are stress related. She does not want to increase medication. There is no nausea, photophobia, with the headaches. 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 Fell several weeks ago and hit the back of the head. No obvious injury. See ROS  07/18/12: Returns for followup. MMSE stable. Went off Depakote for a few weeks and headaches returned and mood was terrible. Her PCP gave her a RX to get back on the Depakote. Headaches are stable at present. Continues to work with out problems with performing job. Had hernia repair in March and complication of PE after surgery. Has just gotten off coumadin. No new complaints   REVIEW OF SYSTEMS: Full 14 system review of systems performed and notable only for those listed, all others are neg:  Constitutional: neg  Cardiovascular: neg Ear/Nose/Throat: neg  Skin: neg Eyes: neg Respiratory: neg Gastroitestinal: neg  Hematology/Lymphatic: neg  Endocrine: neg Musculoskeletal:neg Allergy/Immunology: neg Neurological: neg Psychiatric: neg Sleep : neg   ALLERGIES: Allergies  Allergen Reactions  . Aleve [Naproxen Sodium] Hives, Itching and Swelling  . Letrozole Shortness Of Breath, Rash and Other (See Comments)    Swelling -  feet, eyes,hands;  Speech garbled  . Codeine Other (See Comments)    hallucinations  . Other Swelling    Swelling of  Eyes   Wheat ,oranges banana, zucchini,cayenne,chili  Peppers, sweet potatoes, pumpkin  . Penicillins Hives, Swelling and Rash    All over the body.  Marland Kitchen Percocet [Oxycodone-Acetaminophen] Other (See Comments)    Causes syncope day after medication has been  taken.  . Vicodin [Hydrocodone-Acetaminophen] Other (See Comments)    Causes syncope day after medication has been taken.    HOME MEDICATIONS: Outpatient Prescriptions Prior to Visit  Medication Sig Dispense Refill  . ALPRAZolam (XANAX) 0.5 MG tablet Take 0.5 mg by mouth at bedtime as needed.     . cetirizine (ZYRTEC) 10 MG tablet Take 10 mg by mouth every evening.     . divalproex (DEPAKOTE ER) 500 MG 24 hr tablet Take 1 tablet (500 mg total) by mouth at bedtime. 90 tablet 3  . exemestane (AROMASIN) 25 MG tablet Take 1 tablet (25 mg total) by mouth daily after breakfast. 90 tablet 4  . hydrochlorothiazide (HYDRODIURIL) 25 MG tablet Take 25 mg by mouth at bedtime.     Marland Kitchen NATURE-THROID 97.5 MG TABS Take 97.5 mg by mouth 2 (two) times daily.    . traMADol (ULTRAM) 50 MG tablet TAKE 1 TABLET BY MOUTH EVERY 6 HOURS AS NEEDED 120 tablet 0  . Cholecalciferol (VITAMIN D3) 5000 UNITS CAPS Take 1 capsule by mouth daily.    . cloNIDine (CATAPRES - DOSED IN MG/24 HR) 0.1 mg/24hr patch Place 1 patch (0.1 mg total) onto the skin once a week. (Patient not taking: Reported on 06/29/2015) 4 patch 5  . traZODone (DESYREL) 50 MG tablet Take 50 mg by mouth at bedtime.     No facility-administered medications prior to visit.    PAST MEDICAL HISTORY: Past Medical History  Diagnosis Date  . Hyperlipidemia   . Thyroid disease   . Abdominal pain   . Post concussion syndrome   . Ventral hernia   . Hypertension     PCP DR Nolon Rod  . Recurrent upper respiratory infection (URI)     FLU 08/23/11-08/29/11 then URI 08/29/11- 09/08/11- S/P zpack and steroids-  improved      no cough or congestion  . Hypothyroidism   . Post concussion syndrome     4 yrs ago- sees [facility]- Dr Leonie Man every 6 months-   has sleep study 2011 following accident but was neg per patient-  short term memory issues, dates and times  . Headache(784.0)   . Hernia, incisional periumbilical, incarcerated 8/93/7342  . Thyroid mass, left  inferior lobe, 3.6cm [id number] 10/07/2011  . Pulmonary embolism ([place])   . Hot flashes   . Peripheral vascular disease ([place])     pulmonary embolis-still have some  . Cancer ([place])     breast  . Breast cancer ([place]) 06/2013    left upper inner  . Hx of radiation therapy 09/09/13- 10/13/13    left breast 5000 cGy in 25 sessions, no boost    PAST SURGICAL HISTORY: Past Surgical History  Procedure Laterality Date  . Cholecystectomy  2006    lap chole, Dr. Bubba Camp  . Appendectomy  2002    lap appy.  Dr. Hassell Done  . Carpel tunnel      left in April 2011, right in June 2012  . Tarsal tunnel release      bilateral  . Cesarean section      x2  .  Hernia repair  09/15/11    Ventral w/mesh  . Ventral hernia repair  09/29/2011    Procedure: LAPAROSCOPIC VENTRAL HERNIA;  Surgeon: Adin Hector, MD;  Location: [facility];  Service: General;  Laterality: N/A;  Laparoscopic Ventral Wall Hernia Repair with Mesh  . Breast lumpectomy with needle localization and axillary sentinel lymph node bx Left 08/04/2013    Procedure: BREAST LUMPECTOMY WITH NEEDLE LOCALIZATION AND AXILLARY SENTINEL LYMPH NODE Biopsy x 3;  Surgeon: Rolm Bookbinder, MD;  Location: [facility];  Service: General;  Laterality: Left;    FAMILY HISTORY: Family History  Problem Relation Age of Onset  . Malignant hyperthermia Mother   . Heart attack Father   . Stroke Father   . Diabetes      Grandmother  . Other      respiratory- Grandmother  . Cancer Maternal Grandmother 27    breast    SOCIAL HISTORY: Social History   Social History  . Marital Status: Divorced    Spouse Name: N/A  . Number of Children: 2  . Years of Education: Master's   Occupational History  .     Social History Main Topics  . Smoking status: Never Smoker   . Smokeless tobacco: Never Used  . Alcohol Use: No  . Drug Use: No  . Sexual Activity: Yes     Comment: Menarche age 27, first live birth age 52,  [place] P2. She stopped having periods approximately 1999 and  took HRT until 2005.   Other Topics Concern  . Not on file   Social History Narrative   Patient is divorced and lives alone.   Patient has two children.   Patient is a Pharmacist, hospital in [employer].   Patient has a Master's degree.   Patient drinks 3-4 cups of caffeine daily.    Patient is right handed.     PHYSICAL EXAM  Filed Vitals:   06/29/15 1458  BP: 146/81  Pulse: 84  Height: 5' 2"  (1.575 m)  Weight: 241 lb 9.6 oz (109.589 kg)   Body mass index is 44.18 kg/(m^2). Generalized: Well developed, obese female in no acute distress  Head: normocephalic and atraumatic,. Oropharynx benign  Neck: Supple, no carotid bruits  Cardiac: Regular rate rhythm, no murmur  Musculoskeletal: No deformity   Neurological examination   Mentation: Alert oriented to time, place, history taking. MMSE 30/30. AFT 16. Clock drawing 4 out of 4 .Follows all commands speech and language fluent  Cranial nerve II-XII: Pupils were equal round reactive to light extraocular movements were full, visual field were full on confrontational test. Facial sensation and strength were normal. hearing was intact to finger rubbing bilaterally. Uvula tongue midline. head turning and shoulder shrug were normal and symmetric.Tongue protrusion into cheek strength was normal. Motor: normal bulk and tone, full strength in the BUE, BLE, fine finger movements normal, no pronator drift. No focal weakness Sensory: normal and symmetric to light touch, pinprick, and vibration  Coordination: finger-nose-finger, heel-to-shin bilaterally, no dysmetria Reflexes: 1+ upper lower and symmetric Gait and Station: Rising up from seated position without assistance, normal stance, moderate stride, good arm swing, smooth turning, able to perform tiptoe, and heel walking without difficulty. Tandem gait is steady  DIAGNOSTIC DATA (LABS, IMAGING, TESTING) - I reviewed patient records, labs, notes, testing and imaging  myself where available.  Lab Results  Component Value Date   WBC 4.2 02/09/2015   HGB 14.0 02/09/2015   HCT 43.1 02/09/2015   MCV 86.5  02/09/2015   PLT 222 02/09/2015      Component Value Date/Time   NA 141 02/09/2015 1345   NA 142 08/01/2013 1535   K 3.8 02/09/2015 1345   K 4.4 08/01/2013 1535   CL 97 08/01/2013 1535   CO2 30* 02/09/2015 1345   CO2 31 08/01/2013 1535   GLUCOSE 94 02/09/2015 1345   GLUCOSE 103* 08/01/2013 1535   BUN 13.5 02/09/2015 1345   BUN 19 08/01/2013 1535   CREATININE 0.8 02/09/2015 1345   CREATININE 1.00 08/01/2013 1535   CALCIUM 9.7 02/09/2015 1345   CALCIUM 9.4 11/19/2013 1551   PROT 6.9 02/09/2015 1345   PROT 6.4 10/08/2011 0525   ALBUMIN 3.6 02/09/2015 1345   ALBUMIN 2.8* 10/08/2011 0525   AST 148* 02/09/2015 1345   AST 119* 10/08/2011 0525   ALT 160* 02/09/2015 1345   ALT 155* 10/08/2011 0525   ALKPHOS 74 02/09/2015 1345   ALKPHOS 151* 10/08/2011 0525   BILITOT 0.97 02/09/2015 1345   BILITOT 1.0 10/08/2011 0525   GFRNONAA 60* 08/01/2013 1535   GFRAA 36* 08/01/2013 1535    ASSESSMENT AND PLAN  63 y.o. year old female  has a past medical history of Hyperlipidemia; Post concussion syndrome;  Hypertension; Headache(784.0);  here to follow-up for her memory loss which is stable.  PLANContinue Depakote as ordered will refill for 1 year, this is for headache preventative Memory score remains stable F/U yearly Alyssa [name], [facility], [facility], [facility] [address] [place], [place] [address] [phone]

## 2015-06-30 NOTE — Progress Notes (Signed)
I agree with the above plan 

## 2015-07-14 ENCOUNTER — Other Ambulatory Visit: Payer: Self-pay | Admitting: Oncology

## 2015-07-14 DIAGNOSIS — Z853 Personal history of malignant neoplasm of breast: Secondary | ICD-10-CM

## 2015-07-15 ENCOUNTER — Ambulatory Visit: Payer: BC Managed Care – PPO | Admitting: Nurse Practitioner

## 2015-07-16 ENCOUNTER — Ambulatory Visit
Admission: RE | Admit: 2015-07-16 | Discharge: 2015-07-16 | Disposition: A | Discharge status: Home / Self Care | Payer: BC Managed Care – PPO | Source: Ambulatory Visit | Attending: Oncology | Admitting: Oncology

## 2015-07-16 DIAGNOSIS — Z853 Personal history of malignant neoplasm of breast: Secondary | ICD-10-CM

## 2015-07-23 ENCOUNTER — Other Ambulatory Visit: Payer: Self-pay | Admitting: *Deleted

## 2015-07-23 DIAGNOSIS — C50212 Malignant neoplasm of upper-inner quadrant of left female breast: Secondary | ICD-10-CM

## 2015-07-27 ENCOUNTER — Other Ambulatory Visit (HOSPITAL_BASED_OUTPATIENT_CLINIC_OR_DEPARTMENT_OTHER): Payer: BC Managed Care – PPO

## 2015-07-27 ENCOUNTER — Telehealth: Payer: Self-pay | Admitting: Oncology

## 2015-07-27 ENCOUNTER — Ambulatory Visit (HOSPITAL_BASED_OUTPATIENT_CLINIC_OR_DEPARTMENT_OTHER): Payer: BC Managed Care – PPO | Admitting: Oncology

## 2015-07-27 VITALS — BP 153/69 | HR 65 | Temp 97.8°F | Resp 18 | Wt 235.1 lb

## 2015-07-27 DIAGNOSIS — C50212 Malignant neoplasm of upper-inner quadrant of left female breast: Secondary | ICD-10-CM | POA: Diagnosis not present

## 2015-07-27 DIAGNOSIS — Z79811 Long term (current) use of aromatase inhibitors: Secondary | ICD-10-CM

## 2015-07-27 DIAGNOSIS — Z170 Estrogen receptor positive status [ER+]: Secondary | ICD-10-CM

## 2015-07-27 DIAGNOSIS — M859 Disorder of bone density and structure, unspecified: Secondary | ICD-10-CM | POA: Diagnosis not present

## 2015-07-27 DIAGNOSIS — Z86711 Personal history of pulmonary embolism: Secondary | ICD-10-CM

## 2015-07-27 LAB — COMPREHENSIVE METABOLIC PANEL
ALBUMIN: 4.1 g/dL (ref 3.5–5.0)
ALT: 92 U/L — ABNORMAL HIGH (ref 0–55)
AST: 66 U/L — ABNORMAL HIGH (ref 5–34)
Alkaline Phosphatase: 72 U/L (ref 40–150)
Anion Gap: 9 mEq/L (ref 3–11)
BILIRUBIN TOTAL: 1.08 mg/dL (ref 0.20–1.20)
BUN: 12.1 mg/dL (ref 7.0–26.0)
CO2: 29 mEq/L (ref 22–29)
CREATININE: 0.9 mg/dL (ref 0.6–1.1)
Calcium: 9.7 mg/dL (ref 8.4–10.4)
Chloride: 98 mEq/L (ref 98–109)
EGFR: 71 mL/min/{1.73_m2} — ABNORMAL LOW (ref >90)
Glucose: 99 mg/dl (ref 70–140)
Potassium: 3.9 mEq/L (ref 3.5–5.1)
Sodium: 136 mEq/L (ref 136–145)
Total Protein: 8.1 g/dL (ref 6.4–8.3)

## 2015-07-27 LAB — CBC WITH DIFFERENTIAL/PLATELET
BASO%: 0.9 % (ref 0.0–2.0)
BASOS ABS: 0 10*3/uL (ref 0.0–0.1)
EOS ABS: 0.1 10*3/uL (ref 0.0–0.5)
EOS%: 2.7 % (ref 0.0–7.0)
HEMATOCRIT: 47 % — AB (ref 34.8–46.6)
HEMOGLOBIN: 15.5 g/dL (ref 11.6–15.9)
LYMPH%: 33.2 % (ref 14.0–49.7)
MCH: 28.6 pg (ref 25.1–34.0)
MCHC: 32.9 g/dL (ref 31.5–36.0)
MCV: 86.9 fL (ref 79.5–101.0)
MONO#: 0.5 10*3/uL (ref 0.1–0.9)
MONO%: 9.5 % (ref 0.0–14.0)
NEUT#: 2.9 10*3/uL (ref 1.5–6.5)
NEUT%: 53.7 % (ref 38.4–76.8)
Platelets: 244 10*3/uL (ref 145–400)
RBC: 5.41 10*6/uL (ref 3.70–5.45)
RDW: 14.1 % (ref 11.2–14.5)
WBC: 5.4 10*3/uL (ref 3.9–10.3)
lymph#: 1.8 10*3/uL (ref 0.9–3.3)

## 2015-07-27 NOTE — Progress Notes (Signed)
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 Left diagnostic mammography and ultrasonography 07/21/2013 showed an irregular high-density mass in the upper inner quadrant of the left breast which was palpable at the 11:00 position 5 cm from the nipple. Ultrasound confirmed an irregular hypoechoic mass measuring 1.1 cm. The left axilla was unremarkable.  Biopsy of this mass was obtained the same day and showed ([id number]) an invasive ductal carcinoma, grade 1, estrogen and progesterone receptor both strongly positive by verbal report, with no HER-2 amplification (signals ratio 1.19, number per cell 1.85) and an MIB-1 of 5%.  The patient's subsequent history is as detailed below.  INTERVAL HISTORY: Alyssa [name] returns today for follow up of her estrogen receptor positive breast cancer.after the last visit here she started exemestane. This was her third aromatase inhibitor and I was afraid she might not be able to tolerated it, but she actually is doing quite well with it. She does have hot flashes and they do wake her up at night. Vaginal dryness is "not an issue". Importantly she is able to obtain this expensive drug for just attend our month co-pay.   REVIEW OF SYSTEMS: Alyssa [name] tells me she has just been diagnosed with diabetes. She is on a diet but has not yet started an exercise program. She is had an upper respiratory infection which lasted over a month but is finally improving after a bout of  antibiotics. She tells me both her knees are going to have to be done. This of course limits the types of exercise that she can do. She has back and joint pain which is not more intense or persistent than before. A detailed review of systems today was otherwise stable  PAST MEDICAL HISTORY: Past Medical History  Diagnosis Date  . Hyperlipidemia   . Thyroid disease   . Abdominal pain   . Post concussion syndrome   . Ventral hernia   . Hypertension     PCP DR Nolon Rod  . Recurrent upper respiratory infection (URI)     FLU 08/23/11-08/29/11 then URI 08/29/11- 09/08/11- S/P zpack and steroids-  improved      no cough or congestion  . Hypothyroidism   . Post concussion syndrome     4 yrs ago- sees [facility]- Dr Leonie Man every 6 months-   has sleep study 2011 following accident but was neg per patient-  short term memory issues, dates and times  . Headache(784.0)   . Hernia, incisional periumbilical, incarcerated 07/26/7251  . Thyroid mass, left inferior lobe, 3.6cm [id number] 10/07/2011  . Pulmonary embolism ([place])   . Hot flashes   . Peripheral vascular disease ([employer])     pulmonary embolis-still have some  . Cancer ([place])     breast  . Breast cancer ([place]) 06/2013    left upper inner  . 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 Cholecystectomy  2006    lap chole, Dr. Bubba Camp  . Appendectomy  2002    lap appy.  Dr. Hassell Done  . Carpel tunnel      left in April 2011, right in June 2012  . Tarsal tunnel release      bilateral  . Cesarean section      x2  . Hernia repair  09/15/11    Ventral w/mesh  . Ventral hernia repair  09/29/2011    Procedure: LAPAROSCOPIC VENTRAL HERNIA;  Surgeon: Adin Hector, MD;  Location: [facility];  Service: General;  Laterality: N/A;  Laparoscopic Ventral Wall  Hernia Repair with Mesh  . Breast lumpectomy with needle localization and axillary sentinel lymph node bx Left 08/04/2013    Procedure: BREAST LUMPECTOMY WITH NEEDLE LOCALIZATION AND AXILLARY SENTINEL LYMPH NODE Biopsy x 3;  Surgeon: Rolm Bookbinder, MD;  Location: [facility];  Service: General;  Laterality: Left;    FAMILY HISTORY Family History  Problem Relation Age of Onset  . Malignant hyperthermia Mother   . Heart attack Father   . Stroke Father   . Diabetes      Grandmother  . Other      respiratory- Grandmother  . Cancer Maternal Grandmother 53    breast   patient's father died at the age of 9 from a myocardial infarction. The patient's mother, Alyssa [name], is alive at 19. She lives with the patient, and is very independent (drives, etc.) The patient has 2 brothers, no sisters. The patient's mother is mother was diagnosed with breast cancer at the age of 48. There is no history of breast or ovarian cancer in the family (there is a history of cervical cancer in the patient's daughter).  GYNECOLOGIC HISTORY:  Menarche age 77, first live birth age 70, the patient is Alyssa [name]. She stopped having periods approximately 1999 and took hormone replacements until 2005.  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 She is divorced and lives with her mother Alyssa [name]. Daughter Alyssa [name] is a business woman living in [place]. Son Alyssa [name] lives in [place], also in business. The patient has 3 grandchildren. She attends [employer]    ADVANCED DIRECTIVES: Not in place   HEALTH MAINTENANCE: Social History  Substance Use Topics  . Smoking status: Never Smoker   . Smokeless tobacco: Never Used  . Alcohol Use: No     Colonoscopy: Never  PAP:  Bone density: Never  Lipid panel:  Allergies  Allergen Reactions  . Aleve [Naproxen Sodium] Hives, Itching and Swelling  . Letrozole Shortness Of Breath, Rash and Other (See Comments)     Swelling - feet, eyes,hands;  Speech garbled  . Codeine Other (See Comments)    hallucinations  . Other Swelling    Swelling of  Eyes   Wheat ,oranges banana, zucchini,cayenne,chili  Peppers, sweet potatoes, pumpkin  . Penicillins Hives, Swelling and Rash    All over the body.  Marland Kitchen Percocet [Oxycodone-Acetaminophen] Other (See Comments)    Causes syncope day after medication has been taken.  . Vicodin [Hydrocodone-Acetaminophen] Other (See Comments)    Causes syncope day after medication has been taken.    Current Outpatient Prescriptions  Medication Sig Dispense Refill  . linagliptin (TRADJENTA) 5 MG TABS tablet Take 5 mg by mouth daily.    Marland Kitchen lisinopril (PRINIVIL,ZESTRIL) 5 MG tablet Take 5 mg by mouth daily.    Marland Kitchen ALPRAZolam (  XANAX) 0.5 MG tablet Take 0.5 mg by mouth at bedtime as needed.     . benzonatate (TESSALON) 200 MG capsule     . exemestane (AROMASIN) 25 MG tablet Take 1 tablet (25 mg total) by mouth daily after breakfast. 90 tablet 4  . hydrochlorothiazide (HYDRODIURIL) 25 MG tablet Take 25 mg by mouth at bedtime.     Marland Kitchen NATURE-THROID 97.5 MG TABS Take 97.5 mg by mouth 2 (two) times daily.    . traMADol (ULTRAM) 50 MG tablet TAKE 1 TABLET BY MOUTH EVERY 6 HOURS AS NEEDED 120 tablet 0   No current facility-administered medications for this visit.    OBJECTIVE: Middle-aged white woman in no acute distress Filed Vitals:   07/27/15 1431  BP: 153/69  Pulse: 65  Temp: 97.8 F (36.6 C)  Resp: 18     Body mass index is 42.99 kg/(m^2).    ECOG FS:1 - Symptomatic but completely ambulatory  Sclerae unicteric, EOMs intact Oropharynx clear, dentition in good repair No cervical or supraclavicular adenopathy Lungs no rales or rhonchi Heart regular rate and rhythm Abd soft, nontender, positive bowel sounds MSK no focal spinal tenderness, no upper extremity lymphedema Neuro: nonfocal, well oriented, appropriate affect Breasts: the right breast is unremarkable. The left breast is  status post lumpectomy and radiation. There is no evidence of local recurrence. The cosmetic result is good.    LAB RESULTS:  CMP     Component Value Date/Time   NA 136 07/27/2015 1410   NA 142 08/01/2013 1535   K 3.9 07/27/2015 1410   K 4.4 08/01/2013 1535   CL 97 08/01/2013 1535   CO2 29 07/27/2015 1410   CO2 31 08/01/2013 1535   GLUCOSE 99 07/27/2015 1410   GLUCOSE 103* 08/01/2013 1535   BUN 12.1 07/27/2015 1410   BUN 19 08/01/2013 1535   CREATININE 0.9 07/27/2015 1410   CREATININE 1.00 08/01/2013 1535   CALCIUM 9.7 07/27/2015 1410   CALCIUM 9.4 11/19/2013 1551   PROT 8.1 07/27/2015 1410   PROT 6.4 10/08/2011 0525   ALBUMIN 4.1 07/27/2015 1410   ALBUMIN 2.8* 10/08/2011 0525   AST 66* 07/27/2015 1410   AST 119* 10/08/2011 0525   ALT 92* 07/27/2015 1410   ALT 155* 10/08/2011 0525   ALKPHOS 72 07/27/2015 1410   ALKPHOS 151* 10/08/2011 0525   BILITOT 1.08 07/27/2015 1410   BILITOT 1.0 10/08/2011 0525   GFRNONAA 60* 08/01/2013 1535   GFRAA 69* 08/01/2013 1535    I No results found for: SPEP  Lab Results  Component Value Date   WBC 5.4 07/27/2015   NEUTROABS 2.9 07/27/2015   HGB 15.5 07/27/2015   HCT 47.0* 07/27/2015   MCV 86.9 07/27/2015   PLT 244 07/27/2015      Chemistry      Component Value Date/Time   NA 136 07/27/2015 1410   NA 142 08/01/2013 1535   K 3.9 07/27/2015 1410   K 4.4 08/01/2013 1535   CL 97 08/01/2013 1535   CO2 29 07/27/2015 1410   CO2 31 08/01/2013 1535   BUN 12.1 07/27/2015 1410   BUN 19 08/01/2013 1535   CREATININE 0.9 07/27/2015 1410   CREATININE 1.00 08/01/2013 1535      Component Value Date/Time   CALCIUM 9.7 07/27/2015 1410   CALCIUM 9.4 11/19/2013 1551   ALKPHOS 72 07/27/2015 1410   ALKPHOS 151* 10/08/2011 0525   AST 66* 07/27/2015 1410   AST 119* 10/08/2011 0525   ALT 92* 07/27/2015  1410   ALT 155* 10/08/2011 0525   BILITOT 1.08 07/27/2015 1410   BILITOT 1.0 10/08/2011 0525       No results found for:  LABCA2  No components found for: [id number]  No results for input(s): INR in the last 168 hours.  Urinalysis    Component Value Date/Time   COLORURINE AMBER* 10/07/2011 0501   APPEARANCEUR CLOUDY* 10/07/2011 0501   LABSPEC 1.034* 10/07/2011 0501   PHURINE 5.5 10/07/2011 0501   GLUCOSEU NEGATIVE 10/07/2011 0501   HGBUR NEGATIVE 10/07/2011 0501   BILIRUBINUR SMALL* 10/07/2011 0501   KETONESUR TRACE* 10/07/2011 0501   PROTEINUR NEGATIVE 10/07/2011 0501   UROBILINOGEN 0.2 10/07/2011 0501   NITRITE NEGATIVE 10/07/2011 0501   LEUKOCYTESUR NEGATIVE 10/07/2011 0501    STUDIES:Mm Diag Breast Tomo Bilateral  07/16/2015  CLINICAL DATA:  History of left breast cancer diagnosed in December of 2014 status post lumpectomy and radiation therapy. EXAM: DIGITAL DIAGNOSTIC BILATERAL MAMMOGRAM WITH 3D TOMOSYNTHESIS AND CAD COMPARISON:  Previous exam(s). ACR Breast Density Category b: There are scattered areas of fibroglandular density. FINDINGS: There are stable postsurgical changes within the left breast. There are no new dominant masses, suspicious calcifications or secondary signs of malignancy within either breast. Mammographic images were processed with CAD. IMPRESSION: No evidence of malignancy within either breast. Stable postsurgical changes within the left breast. RECOMMENDATION: Bilateral diagnostic mammogram in 1 year. I have discussed the findings and recommendations with the patient. Results were also provided in writing at the conclusion of the visit. If applicable, a reminder letter will be sent to the patient regarding the next appointment. BI-RADS CATEGORY  2: Benign. Electronically Signed   By: Franki Cabot M.D.   On: 07/16/2015 15:55    ASSESSMENT: 65 y.o. [place] woman status post left breast biopsy 07/21/2013 for a clinical T1 N0, stage IA invasive ductal carcinoma, grade 1, estrogen and progesterone receptor strongly positive, HER-2 not amplified, with an MIB-1 of 5%  (1) History  of pulmonary embolism documented by CT angiography 10/07/2011 (9 days after ventral hernia repair); Dopplers 11-2011 showed no lower extremity DVTs; the patient received Coumadin for 10 months. Hypercoagulable  Workup performed in January 2015 showed negative LAC, anti-cardiolipin Abs, negative to borderline beta-2-glycoprotein Abs, AT, protein C and S, prothrombin II gene mutation and Factor V Leiden  (2) status post left lumpectomy and sentinel lymph node sampling 08/04/2013 for a pT1c pN0, stage IA invasive ductal carcinoma, grade 1, with repeat HER-2 again negative.  (3) Oncotype score of 14 predicts a risk of outside the breast recurrence within 10 years of 9% if the patient's only systemic therapy is tamoxifen for 5 years. Also predicts no benefit from chemotherapy.  (4) Adjuvant Radiation therapy started on 09/09/2013 completed on 10/13/2013  (5) Adjuvant Aromatase  inhibitor therapy with Letrozole start date-11/19/2013  (a) Letrozole was discontinued 12/03/2013 secondary to allergic reaction (rash)  (b) anastrozole start date:  12/05/2013, discontinued 12/01/2014 due to side effects  (c) exemestane started July 2016  (6) osteopenia with a T score of -2.2 on bone density 10/29/2013  (a) zolendronate started 11/05/2014  PLAN: Yarelis is tolerating the exemestane well, and the plan is going to be to continue that a total of 5 years.  She is having some nighttime hot flashes and I offered to add gabapentin to her medications, but she is already on "too many pills" and she "can't live with it". She will let me know if that becomes more of a problem.  Now that she  has been diagnosed with diabetes we talked extensively about diet and exercise. I gave her a pamphlet on the [place] program and strongly encouraged her to participate.  Otherwise she will return in April for zolendronate (note that she goes to the dentist for times a year) and in July for a visit. She will then see me again a year  from now in from that point we will start seeing her on a once a year basis. Chauncey Cruel, MD   07/27/2015 2:51 PM

## 2015-07-27 NOTE — Telephone Encounter (Signed)
Appointments made and avs printed for patient °

## 2015-10-22 ENCOUNTER — Other Ambulatory Visit: Payer: Self-pay | Admitting: Oncology

## 2015-11-15 NOTE — Progress Notes (Signed)
Patient calling to see if she needs a letter from her dentist to have lab draw and zometa injection.  Patient see's her dentist every three months and is followed very closely.  Writer told her she did not need a letter.  Patient stated understanding.

## 2015-11-17 ENCOUNTER — Other Ambulatory Visit (HOSPITAL_BASED_OUTPATIENT_CLINIC_OR_DEPARTMENT_OTHER): Payer: BC Managed Care – PPO

## 2015-11-17 ENCOUNTER — Ambulatory Visit (HOSPITAL_BASED_OUTPATIENT_CLINIC_OR_DEPARTMENT_OTHER): Payer: BC Managed Care – PPO

## 2015-11-17 VITALS — BP 155/71 | HR 63 | Temp 98.2°F | Resp 16

## 2015-11-17 DIAGNOSIS — C50212 Malignant neoplasm of upper-inner quadrant of left female breast: Secondary | ICD-10-CM | POA: Diagnosis not present

## 2015-11-17 LAB — COMPREHENSIVE METABOLIC PANEL
ALT: 50 U/L (ref 0–55)
AST: 34 U/L (ref 5–34)
Albumin: 4 g/dL (ref 3.5–5.0)
Alkaline Phosphatase: 58 U/L (ref 40–150)
Anion Gap: 9 mEq/L (ref 3–11)
BUN: 15 mg/dL (ref 7.0–26.0)
CALCIUM: 9.9 mg/dL (ref 8.4–10.4)
CO2: 31 mEq/L — ABNORMAL HIGH (ref 22–29)
Chloride: 102 mEq/L (ref 98–109)
Creatinine: 0.8 mg/dL (ref 0.6–1.1)
EGFR: 73 mL/min/{1.73_m2} — ABNORMAL LOW (ref >90)
GLUCOSE: 91 mg/dL (ref 70–140)
POTASSIUM: 3.4 meq/L — AB (ref 3.5–5.1)
SODIUM: 142 meq/L (ref 136–145)
Total Bilirubin: 0.98 mg/dL (ref 0.20–1.20)
Total Protein: 7.4 g/dL (ref 6.4–8.3)

## 2015-11-17 LAB — CBC WITH DIFFERENTIAL/PLATELET
BASO%: 0.9 % (ref 0.0–2.0)
Basophils Absolute: 0.1 10*3/uL (ref 0.0–0.1)
EOS%: 2.2 % (ref 0.0–7.0)
Eosinophils Absolute: 0.1 10*3/uL (ref 0.0–0.5)
HCT: 45.3 % (ref 34.8–46.6)
HGB: 15.2 g/dL (ref 11.6–15.9)
LYMPH%: 40.7 % (ref 14.0–49.7)
MCH: 29.3 pg (ref 25.1–34.0)
MCHC: 33.6 g/dL (ref 31.5–36.0)
MCV: 87.3 fL (ref 79.5–101.0)
MONO#: 0.7 10*3/uL (ref 0.1–0.9)
MONO%: 12.5 % (ref 0.0–14.0)
NEUT#: 2.4 10*3/uL (ref 1.5–6.5)
NEUT%: 43.7 % (ref 38.4–76.8)
Platelets: 210 10*3/uL (ref 145–400)
RBC: 5.19 10*6/uL (ref 3.70–5.45)
RDW: 13.3 % (ref 11.2–14.5)
WBC: 5.5 10*3/uL (ref 3.9–10.3)
lymph#: 2.3 10*3/uL (ref 0.9–3.3)
nRBC: 0 % (ref 0–0)

## 2015-11-17 MED ORDER — ZOLEDRONIC ACID 4 MG/100ML IV SOLN
4.0000 mg | Freq: Once | INTRAVENOUS | Status: AC
  Administered 2015-11-17: 4 mg via INTRAVENOUS
  Filled 2015-11-17: qty 100

## 2015-11-17 MED ORDER — SODIUM CHLORIDE 0.9 % IV SOLN
Freq: Once | INTRAVENOUS | Status: AC
  Administered 2015-11-17: 16:00:00 via INTRAVENOUS

## 2015-11-17 NOTE — Patient Instructions (Signed)

## 2015-11-30 ENCOUNTER — Other Ambulatory Visit: Payer: Self-pay | Admitting: Nurse Practitioner

## 2015-12-09 NOTE — Pre-Procedure Instructions (Signed)
Alyssa [name]  12/09/2015     Your procedure is scheduled on : Wednesday Dec 22, 2015 at 12:30 PM.  Report to [facility] Admitting at 10:30 AM.  Call this number if you have problems the morning of surgery: [phone]    Remember:  Do not eat food or drink liquids after midnight.  Take these medicines the morning of surgery with A SIP OF WATER : Alprazolam (Xanax) if needed, Cetirizine (Zyrtec), Tramadol (Ultram) if needed   Stop taking any vitamins, herbal medications/supplements, NSAIDs, Ibuprofen, Advil, Motrin, Aleve, etc on Wednesday May 24th   Do NOT take any diabetic pills the morning of your surgery (NO Tradjenta)     How to Manage Your Diabetes Before and After Surgery  Why is it important to control my blood sugar before and after surgery? . Improving blood sugar levels before and after surgery helps healing and can limit problems. . A way of improving blood sugar control is eating a healthy diet by: o  Eating less sugar and carbohydrates o  Increasing activity/exercise o  Talking with your doctor about reaching your blood sugar goals . High blood sugars (greater than 180 mg/dL) can raise your risk of infections and slow your recovery, so you will need to focus on controlling your diabetes during the weeks before surgery. . Make sure that the doctor who takes care of your diabetes knows about your planned surgery including the date and location.  How do I manage my blood sugar before surgery? . Check your blood sugar at least 4 times a day, starting 2 days before surgery, to make sure that the level is not too high or low. o Check your blood sugar the morning of your surgery when you wake up and every 2 hours until you get to the Short Stay unit. . If your blood sugar is less than 70 mg/dL, you will need to treat for low blood sugar: o Do not take insulin. o Treat a low blood sugar (less than 70 mg/dL) with  cup of clear juice (cranberry or apple), 4  glucose tablets, OR glucose gel. o Recheck blood sugar in 15 minutes after treatment (to make sure it is greater than 70 mg/dL). If your blood sugar is not greater than 70 mg/dL on recheck, call [phone] for further instructions. . Report your blood sugar to the short stay nurse when you get to Short Stay.  . If you are admitted to the hospital after surgery: o Your blood sugar will be checked by the staff and you will probably be given insulin after surgery (instead of oral diabetes medicines) to make sure you have good blood sugar levels. o The goal for blood sugar control after surgery is 80-180 mg/dL.      WHAT DO I DO ABOUT MY DIABETES MEDICATION?  Marland Kitchen Do not take oral diabetes medicines (pills) the morning of surgery.  T Reviewed and Endorsed by [facility] Patient Education Committee, August 2015   Do not wear jewelry, make-up or nail polish.  Do not wear lotions, powders, or perfumes.    Do not shave 48 hours prior to surgery.    Do not bring valuables to the hospital.  [facility] is not responsible for any belongings or valuables.  Contacts, dentures or bridgework may not be worn into surgery.  Leave your suitcase in the car.  After surgery it may be brought to your room.  For patients admitted to the hospital, discharge time will be  determined by your treatment team.  Patients discharged the day of surgery will not be allowed to drive home.   Name and phone number of your driver:    Special instructions:  Shower using CHG soap the night before and the morning of your surgery  Please read over the following fact sheets that you were given. Pain Booklet, Coughing and Deep Breathing, MRSA Information and Surgical Site Infection Prevention

## 2015-12-10 ENCOUNTER — Ambulatory Visit (HOSPITAL_COMMUNITY)
Admission: RE | Admit: 2015-12-10 | Discharge: 2015-12-10 | Disposition: A | Discharge status: Home / Self Care | Payer: BC Managed Care – PPO | Source: Ambulatory Visit | Attending: Surgery | Admitting: Surgery

## 2015-12-10 ENCOUNTER — Other Ambulatory Visit: Payer: Self-pay

## 2015-12-10 ENCOUNTER — Encounter (HOSPITAL_COMMUNITY): Payer: Self-pay

## 2015-12-10 ENCOUNTER — Encounter (HOSPITAL_COMMUNITY)
Admission: RE | Admit: 2015-12-10 | Discharge: 2015-12-10 | Disposition: A | Discharge status: Home / Self Care | Payer: BC Managed Care – PPO | Source: Ambulatory Visit | Attending: Orthopaedic Surgery | Admitting: Orthopaedic Surgery

## 2015-12-10 DIAGNOSIS — Z0183 Encounter for blood typing: Secondary | ICD-10-CM | POA: Insufficient documentation

## 2015-12-10 DIAGNOSIS — M1711 Unilateral primary osteoarthritis, right knee: Secondary | ICD-10-CM | POA: Diagnosis not present

## 2015-12-10 DIAGNOSIS — Z01812 Encounter for preprocedural laboratory examination: Secondary | ICD-10-CM | POA: Diagnosis not present

## 2015-12-10 DIAGNOSIS — Z01818 Encounter for other preprocedural examination: Secondary | ICD-10-CM

## 2015-12-10 HISTORY — DX: Family history of other specified conditions: Z84.89

## 2015-12-10 HISTORY — DX: Type 2 diabetes mellitus without complications: E11.9

## 2015-12-10 HISTORY — DX: Gastro-esophageal reflux disease without esophagitis: K21.9

## 2015-12-10 LAB — TYPE AND SCREEN
ABO/RH(D): B POS
Antibody Screen: NEGATIVE

## 2015-12-10 LAB — URINALYSIS, ROUTINE W REFLEX MICROSCOPIC
BILIRUBIN URINE: NEGATIVE
Glucose, UA: NEGATIVE mg/dL
Hgb urine dipstick: NEGATIVE
Ketones, ur: NEGATIVE mg/dL
LEUKOCYTES UA: NEGATIVE
NITRITE: NEGATIVE
PH: 5.5 (ref 5.0–8.0)
Protein, ur: NEGATIVE mg/dL
SPECIFIC GRAVITY, URINE: 1.024 (ref 1.005–1.030)

## 2015-12-10 LAB — COMPREHENSIVE METABOLIC PANEL
ALT: 50 U/L (ref 14–54)
ANION GAP: 12 (ref 5–15)
AST: 39 U/L (ref 15–41)
Albumin: 3.9 g/dL (ref 3.5–5.0)
Alkaline Phosphatase: 57 U/L (ref 38–126)
BILIRUBIN TOTAL: 1 mg/dL (ref 0.3–1.2)
BUN: 13 mg/dL (ref 6–20)
CO2: 32 mmol/L (ref 22–32)
Calcium: 10.3 mg/dL (ref 8.9–10.3)
Chloride: 97 mmol/L — ABNORMAL LOW (ref 101–111)
Creatinine, Ser: 0.87 mg/dL (ref 0.44–1.00)
GFR calc Af Amer: >60 mL/min (ref >60)
Glucose, Bld: 91 mg/dL (ref 65–99)
POTASSIUM: 3.4 mmol/L — AB (ref 3.5–5.1)
Sodium: 141 mmol/L (ref 135–145)
TOTAL PROTEIN: 7.1 g/dL (ref 6.5–8.1)

## 2015-12-10 LAB — CBC
HEMATOCRIT: 42.9 % (ref 36.0–46.0)
Hemoglobin: 13.8 g/dL (ref 12.0–15.0)
MCH: 28 pg (ref 26.0–34.0)
MCHC: 32.2 g/dL (ref 30.0–36.0)
MCV: 87.2 fL (ref 78.0–100.0)
Platelets: 227 10*3/uL (ref 150–400)
RBC: 4.92 MIL/uL (ref 3.87–5.11)
RDW: 13 % (ref 11.5–15.5)
WBC: 5 10*3/uL (ref 4.0–10.5)

## 2015-12-10 LAB — PROTIME-INR
INR: 1.06 (ref 0.00–1.49)
PROTHROMBIN TIME: 14 s (ref 11.6–15.2)

## 2015-12-10 LAB — SURGICAL PCR SCREEN
MRSA, PCR: NEGATIVE
Staphylococcus aureus: POSITIVE — AB

## 2015-12-10 LAB — GLUCOSE, CAPILLARY: GLUCOSE-CAPILLARY: 108 mg/dL — AB (ref 65–99)

## 2015-12-10 LAB — ABO/RH: ABO/RH(D): B POS

## 2015-12-10 LAB — APTT: APTT: 32 s (ref 24–37)

## 2015-12-10 NOTE — Progress Notes (Signed)
PCP is Juanita Craver  Patient denied having any acute cardiac or pulmonary issues  Nurse inquired about blood glucose and patient informed Nurse that she checks her blood sugars "sometimes, and they range between 80 to 120." Patient also informed Nurse that she was recently diagnosed with diabetes and that she is still in "denial" about it.

## 2015-12-11 LAB — HEMOGLOBIN A1C
HEMOGLOBIN A1C: 6.8 % — AB (ref 4.8–5.6)
MEAN PLASMA GLUCOSE: 148 mg/dL

## 2015-12-21 MED ORDER — VANCOMYCIN HCL 10 G IV SOLR
1500.0000 mg | INTRAVENOUS | Status: AC
  Administered 2015-12-22: 1500 mg via INTRAVENOUS
  Filled 2015-12-21: qty 1500

## 2015-12-22 ENCOUNTER — Encounter (HOSPITAL_COMMUNITY): Payer: Self-pay | Admitting: *Deleted

## 2015-12-22 ENCOUNTER — Inpatient Hospital Stay (HOSPITAL_COMMUNITY): Payer: BC Managed Care – PPO | Admitting: Certified Registered Nurse Anesthetist

## 2015-12-22 ENCOUNTER — Inpatient Hospital Stay (HOSPITAL_COMMUNITY)
Admission: RE | Admit: 2015-12-22 | Discharge: 2015-12-25 | DRG: 470 | Disposition: A | Discharge status: Home Health | Payer: BC Managed Care – PPO | Source: Ambulatory Visit | Attending: Orthopaedic Surgery | Admitting: Orthopaedic Surgery

## 2015-12-22 ENCOUNTER — Encounter (HOSPITAL_COMMUNITY)
Admission: RE | Disposition: A | Discharge status: Home Health | Payer: Self-pay | Source: Ambulatory Visit | Attending: Orthopaedic Surgery

## 2015-12-22 DIAGNOSIS — K219 Gastro-esophageal reflux disease without esophagitis: Secondary | ICD-10-CM | POA: Diagnosis present

## 2015-12-22 DIAGNOSIS — Z96651 Presence of right artificial knee joint: Secondary | ICD-10-CM

## 2015-12-22 DIAGNOSIS — Z853 Personal history of malignant neoplasm of breast: Secondary | ICD-10-CM

## 2015-12-22 DIAGNOSIS — Z79899 Other long term (current) drug therapy: Secondary | ICD-10-CM

## 2015-12-22 DIAGNOSIS — Z923 Personal history of irradiation: Secondary | ICD-10-CM

## 2015-12-22 DIAGNOSIS — M25561 Pain in right knee: Secondary | ICD-10-CM | POA: Diagnosis present

## 2015-12-22 DIAGNOSIS — E1151 Type 2 diabetes mellitus with diabetic peripheral angiopathy without gangrene: Secondary | ICD-10-CM | POA: Diagnosis present

## 2015-12-22 DIAGNOSIS — E039 Hypothyroidism, unspecified: Secondary | ICD-10-CM | POA: Diagnosis present

## 2015-12-22 DIAGNOSIS — I10 Essential (primary) hypertension: Secondary | ICD-10-CM | POA: Diagnosis present

## 2015-12-22 DIAGNOSIS — Z6841 Body Mass Index (BMI) 40.0 and over, adult: Secondary | ICD-10-CM | POA: Diagnosis not present

## 2015-12-22 DIAGNOSIS — Z86711 Personal history of pulmonary embolism: Secondary | ICD-10-CM | POA: Diagnosis not present

## 2015-12-22 DIAGNOSIS — M1711 Unilateral primary osteoarthritis, right knee: Secondary | ICD-10-CM | POA: Diagnosis present

## 2015-12-22 DIAGNOSIS — E785 Hyperlipidemia, unspecified: Secondary | ICD-10-CM | POA: Diagnosis present

## 2015-12-22 DIAGNOSIS — K760 Fatty (change of) liver, not elsewhere classified: Secondary | ICD-10-CM | POA: Diagnosis present

## 2015-12-22 HISTORY — PX: KNEE ARTHROPLASTY: SHX992

## 2015-12-22 LAB — GLUCOSE, CAPILLARY
GLUCOSE-CAPILLARY: 100 mg/dL — AB (ref 65–99)
GLUCOSE-CAPILLARY: 92 mg/dL (ref 65–99)
GLUCOSE-CAPILLARY: 150 mg/dL — AB (ref 65–99)
Glucose-Capillary: 124 mg/dL — ABNORMAL HIGH (ref 65–99)

## 2015-12-22 SURGERY — ARTHROPLASTY, KNEE, TOTAL, USING IMAGELESS COMPUTER-ASSISTED NAVIGATION
Anesthesia: Regional | Laterality: Right

## 2015-12-22 MED ORDER — BISACODYL 10 MG RE SUPP: 10.0000 mg | Freq: Every day | RECTAL | Status: DC | PRN

## 2015-12-22 MED ORDER — HYDROCODONE-ACETAMINOPHEN 7.5-325 MG PO TABS
ORAL_TABLET | ORAL | Status: AC
  Filled 2015-12-22: qty 1

## 2015-12-22 MED ORDER — FENTANYL CITRATE (PF) 250 MCG/5ML IJ SOLN
INTRAMUSCULAR | Status: AC
  Filled 2015-12-22: qty 5

## 2015-12-22 MED ORDER — ONDANSETRON HCL 4 MG PO TABS: 4.0000 mg | ORAL_TABLET | Freq: Four times a day (QID) | ORAL | Status: DC | PRN

## 2015-12-22 MED ORDER — PROPOFOL 10 MG/ML IV BOLUS
INTRAVENOUS | Status: DC | PRN
  Administered 2015-12-22: 160 mg via INTRAVENOUS

## 2015-12-22 MED ORDER — SUGAMMADEX SODIUM 200 MG/2ML IV SOLN
INTRAVENOUS | Status: DC | PRN
  Administered 2015-12-22: 200 mg via INTRAVENOUS

## 2015-12-22 MED ORDER — HYDRALAZINE HCL 20 MG/ML IJ SOLN
INTRAMUSCULAR | Status: DC | PRN
  Administered 2015-12-22: 10 mg via INTRAVENOUS

## 2015-12-22 MED ORDER — EXEMESTANE 25 MG PO TABS
25.0000 mg | ORAL_TABLET | Freq: Every day | ORAL | Status: DC
  Administered 2015-12-23 – 2015-12-25 (×3): 25 mg via ORAL
  Filled 2015-12-22 (×3): qty 1

## 2015-12-22 MED ORDER — HYDROMORPHONE HCL 1 MG/ML IJ SOLN
INTRAMUSCULAR | Status: AC
  Filled 2015-12-22: qty 1

## 2015-12-22 MED ORDER — ONDANSETRON HCL 4 MG/2ML IJ SOLN
4.0000 mg | Freq: Four times a day (QID) | INTRAMUSCULAR | Status: DC | PRN
  Administered 2015-12-22: 4 mg via INTRAVENOUS

## 2015-12-22 MED ORDER — DIPHENHYDRAMINE HCL 50 MG/ML IJ SOLN
INTRAMUSCULAR | Status: AC
  Filled 2015-12-22: qty 1

## 2015-12-22 MED ORDER — ONDANSETRON HCL 4 MG/2ML IJ SOLN
INTRAMUSCULAR | Status: AC
  Filled 2015-12-22: qty 2

## 2015-12-22 MED ORDER — METHOCARBAMOL 500 MG PO TABS
500.0000 mg | ORAL_TABLET | Freq: Four times a day (QID) | ORAL | Status: DC | PRN
  Administered 2015-12-22 – 2015-12-25 (×6): 500 mg via ORAL
  Filled 2015-12-22 (×8): qty 1

## 2015-12-22 MED ORDER — MIDAZOLAM HCL 2 MG/2ML IJ SOLN
1.0000 mg | INTRAMUSCULAR | Status: DC | PRN
  Administered 2015-12-22: 2 mg via INTRAVENOUS

## 2015-12-22 MED ORDER — FENTANYL CITRATE (PF) 100 MCG/2ML IJ SOLN
INTRAMUSCULAR | Status: AC
  Administered 2015-12-22: 100 ug
  Filled 2015-12-22: qty 2

## 2015-12-22 MED ORDER — LISINOPRIL 5 MG PO TABS
5.0000 mg | ORAL_TABLET | Freq: Every day | ORAL | Status: DC
  Administered 2015-12-22 – 2015-12-25 (×4): 5 mg via ORAL
  Filled 2015-12-22 (×4): qty 1

## 2015-12-22 MED ORDER — LINAGLIPTIN 5 MG PO TABS
5.0000 mg | ORAL_TABLET | Freq: Every day | ORAL | Status: DC
  Administered 2015-12-22 – 2015-12-25 (×4): 5 mg via ORAL
  Filled 2015-12-22 (×4): qty 1

## 2015-12-22 MED ORDER — ONDANSETRON HCL 4 MG/2ML IJ SOLN
INTRAMUSCULAR | Status: DC | PRN
  Administered 2015-12-22: 4 mg via INTRAVENOUS

## 2015-12-22 MED ORDER — ALPRAZOLAM 0.5 MG PO TABS: 0.5000 mg | ORAL_TABLET | Freq: Every evening | ORAL | Status: DC | PRN

## 2015-12-22 MED ORDER — MEPERIDINE HCL 25 MG/ML IJ SOLN: 6.2500 mg | INTRAMUSCULAR | Status: DC | PRN

## 2015-12-22 MED ORDER — METOCLOPRAMIDE HCL 5 MG PO TABS
5.0000 mg | ORAL_TABLET | Freq: Three times a day (TID) | ORAL | Status: DC | PRN
Start: 2015-12-22 — End: 2015-12-25

## 2015-12-22 MED ORDER — HYDROMORPHONE HCL 1 MG/ML IJ SOLN
0.5000 mg | INTRAMUSCULAR | Status: DC | PRN
  Administered 2015-12-23 (×2): 0.5 mg via INTRAVENOUS
  Filled 2015-12-22 (×2): qty 1

## 2015-12-22 MED ORDER — 0.9 % SODIUM CHLORIDE (POUR BTL) OPTIME
TOPICAL | Status: DC | PRN
  Administered 2015-12-22: 1000 mL

## 2015-12-22 MED ORDER — DEXAMETHASONE SODIUM PHOSPHATE 10 MG/ML IJ SOLN
INTRAMUSCULAR | Status: AC
  Filled 2015-12-22: qty 1

## 2015-12-22 MED ORDER — BUPIVACAINE-EPINEPHRINE (PF) 0.5% -1:200000 IJ SOLN
INTRAMUSCULAR | Status: DC | PRN
  Administered 2015-12-22: 20 mL

## 2015-12-22 MED ORDER — DIVALPROEX SODIUM 500 MG PO DR TAB
500.0000 mg | DELAYED_RELEASE_TABLET | Freq: Every day | ORAL | Status: DC
  Administered 2015-12-22 – 2015-12-24 (×3): 500 mg via ORAL
  Filled 2015-12-22 (×4): qty 1

## 2015-12-22 MED ORDER — SODIUM CHLORIDE 0.45 % IV SOLN
INTRAVENOUS | Status: DC
  Administered 2015-12-22: 18:00:00 via INTRAVENOUS

## 2015-12-22 MED ORDER — EPHEDRINE SULFATE 50 MG/ML IJ SOLN
INTRAMUSCULAR | Status: DC | PRN
  Administered 2015-12-22 (×3): 5 mg via INTRAVENOUS
  Administered 2015-12-22 (×2): 10 mg via INTRAVENOUS

## 2015-12-22 MED ORDER — MIDAZOLAM HCL 2 MG/2ML IJ SOLN
INTRAMUSCULAR | Status: AC
  Filled 2015-12-22: qty 2

## 2015-12-22 MED ORDER — BUPIVACAINE HCL (PF) 0.25 % IJ SOLN
INTRAMUSCULAR | Status: DC | PRN
  Administered 2015-12-22: 10 mL

## 2015-12-22 MED ORDER — SUGAMMADEX SODIUM 200 MG/2ML IV SOLN
INTRAVENOUS | Status: AC
  Filled 2015-12-22: qty 2

## 2015-12-22 MED ORDER — LABETALOL HCL 5 MG/ML IV SOLN
INTRAVENOUS | Status: AC
  Filled 2015-12-22: qty 4

## 2015-12-22 MED ORDER — LIDOCAINE HCL (CARDIAC) 20 MG/ML IV SOLN
INTRAVENOUS | Status: DC | PRN
  Administered 2015-12-22: 100 mg via INTRATRACHEAL

## 2015-12-22 MED ORDER — BUPIVACAINE HCL (PF) 0.25 % IJ SOLN
INTRAMUSCULAR | Status: AC
  Filled 2015-12-22: qty 30

## 2015-12-22 MED ORDER — DEXAMETHASONE SODIUM PHOSPHATE 10 MG/ML IJ SOLN
INTRAMUSCULAR | Status: DC | PRN
  Administered 2015-12-22: 4 mg via INTRAVENOUS

## 2015-12-22 MED ORDER — HYDROMORPHONE HCL 1 MG/ML IJ SOLN
0.2500 mg | INTRAMUSCULAR | Status: DC | PRN
  Administered 2015-12-22: 0.5 mg via INTRAVENOUS
  Administered 2015-12-22: 0.25 mg via INTRAVENOUS
  Administered 2015-12-22: 0.5 mg via INTRAVENOUS
  Administered 2015-12-22: 0.25 mg via INTRAVENOUS
  Administered 2015-12-22: 0.5 mg via INTRAVENOUS

## 2015-12-22 MED ORDER — LACTATED RINGERS IV SOLN
INTRAVENOUS | Status: DC
  Administered 2015-12-22 (×2): via INTRAVENOUS

## 2015-12-22 MED ORDER — POLYETHYLENE GLYCOL 3350 17 G PO PACK
17.0000 g | PACK | Freq: Every day | ORAL | Status: DC | PRN
  Administered 2015-12-24: 17 g via ORAL
  Filled 2015-12-22: qty 1

## 2015-12-22 MED ORDER — FENTANYL CITRATE (PF) 100 MCG/2ML IJ SOLN
50.0000 ug | INTRAMUSCULAR | Status: AC | PRN
  Administered 2015-12-22: 50 ug via INTRAVENOUS
  Administered 2015-12-22: 100 ug via INTRAVENOUS
  Administered 2015-12-22 (×2): 50 ug via INTRAVENOUS
  Administered 2015-12-22: 100 ug via INTRAVENOUS
  Administered 2015-12-22 (×3): 50 ug via INTRAVENOUS

## 2015-12-22 MED ORDER — DOCUSATE SODIUM 100 MG PO CAPS
100.0000 mg | ORAL_CAPSULE | Freq: Two times a day (BID) | ORAL | Status: DC
  Administered 2015-12-22 – 2015-12-25 (×5): 100 mg via ORAL
  Filled 2015-12-22 (×5): qty 1

## 2015-12-22 MED ORDER — PHENOL 1.4 % MT LIQD: 1.0000 | OROMUCOSAL | Status: DC | PRN

## 2015-12-22 MED ORDER — PROPOFOL 10 MG/ML IV BOLUS
INTRAVENOUS | Status: AC
  Filled 2015-12-22: qty 20

## 2015-12-22 MED ORDER — LABETALOL HCL 5 MG/ML IV SOLN
INTRAVENOUS | Status: DC | PRN
  Administered 2015-12-22 (×3): 5 mg via INTRAVENOUS

## 2015-12-22 MED ORDER — LORATADINE 10 MG PO TABS
10.0000 mg | ORAL_TABLET | Freq: Every day | ORAL | Status: DC
  Administered 2015-12-22 – 2015-12-25 (×2): 10 mg via ORAL
  Filled 2015-12-22 (×3): qty 1

## 2015-12-22 MED ORDER — HYDROCODONE-ACETAMINOPHEN 7.5-325 MG PO TABS
1.0000 | ORAL_TABLET | Freq: Four times a day (QID) | ORAL | Status: DC
  Administered 2015-12-22: 1 via ORAL

## 2015-12-22 MED ORDER — VANCOMYCIN HCL IN DEXTROSE 1-5 GM/200ML-% IV SOLN
1000.0000 mg | Freq: Two times a day (BID) | INTRAVENOUS | Status: AC
  Administered 2015-12-23: 1000 mg via INTRAVENOUS
  Filled 2015-12-22: qty 200

## 2015-12-22 MED ORDER — HYDROCODONE-ACETAMINOPHEN 7.5-325 MG PO TABS
1.0000 | ORAL_TABLET | Freq: Four times a day (QID) | ORAL | Status: DC | PRN
  Administered 2015-12-22 – 2015-12-23 (×2): 1 via ORAL
  Filled 2015-12-22 (×2): qty 1

## 2015-12-22 MED ORDER — ROCURONIUM BROMIDE 100 MG/10ML IV SOLN
INTRAVENOUS | Status: DC | PRN
  Administered 2015-12-22: 50 mg via INTRAVENOUS

## 2015-12-22 MED ORDER — CHLORHEXIDINE GLUCONATE 4 % EX LIQD: 60.0000 mL | Freq: Once | CUTANEOUS | Status: DC

## 2015-12-22 MED ORDER — METHOCARBAMOL 1000 MG/10ML IJ SOLN
500.0000 mg | Freq: Four times a day (QID) | INTRAVENOUS | Status: DC | PRN
  Administered 2015-12-22: 500 mg via INTRAVENOUS
  Filled 2015-12-22 (×2): qty 5

## 2015-12-22 MED ORDER — LIDOCAINE 2% (20 MG/ML) 5 ML SYRINGE
INTRAMUSCULAR | Status: AC
  Filled 2015-12-22: qty 5

## 2015-12-22 MED ORDER — HYDROCHLOROTHIAZIDE 25 MG PO TABS
25.0000 mg | ORAL_TABLET | Freq: Every day | ORAL | Status: DC
  Administered 2015-12-24: 25 mg via ORAL
  Filled 2015-12-22 (×2): qty 1

## 2015-12-22 MED ORDER — ASPIRIN EC 325 MG PO TBEC
325.0000 mg | DELAYED_RELEASE_TABLET | Freq: Every day | ORAL | Status: DC
  Administered 2015-12-23 – 2015-12-25 (×3): 325 mg via ORAL
  Filled 2015-12-22 (×3): qty 1

## 2015-12-22 MED ORDER — SODIUM CHLORIDE 0.9 % IR SOLN
Status: DC | PRN
  Administered 2015-12-22: 3000 mL

## 2015-12-22 MED ORDER — ROSUVASTATIN CALCIUM 5 MG PO TABS
5.0000 mg | ORAL_TABLET | Freq: Every day | ORAL | Status: DC
Start: 2015-12-22 — End: 2015-12-25
  Administered 2015-12-22 – 2015-12-24 (×3): 5 mg via ORAL
  Filled 2015-12-22 (×3): qty 1

## 2015-12-22 MED ORDER — METOCLOPRAMIDE HCL 5 MG/ML IJ SOLN: 5.0000 mg | Freq: Three times a day (TID) | INTRAMUSCULAR | Status: DC | PRN

## 2015-12-22 MED ORDER — DIPHENHYDRAMINE HCL 50 MG/ML IJ SOLN
12.5000 mg | Freq: Once | INTRAMUSCULAR | Status: AC
  Administered 2015-12-22: 12.5 mg via INTRAVENOUS

## 2015-12-22 MED ORDER — MENTHOL 3 MG MT LOZG: 1.0000 | LOZENGE | OROMUCOSAL | Status: DC | PRN

## 2015-12-22 MED ORDER — ROCURONIUM BROMIDE 50 MG/5ML IV SOLN
INTRAVENOUS | Status: AC
  Filled 2015-12-22: qty 1

## 2015-12-22 SURGICAL SUPPLY — 68 items
APL SKNCLS STERI-STRIP NONHPOA (GAUZE/BANDAGES/DRESSINGS) ×1
BANDAGE ELASTIC 4 VELCRO ST LF (GAUZE/BANDAGES/DRESSINGS) ×2 IMPLANT
BANDAGE ELASTIC 6 VELCRO ST LF (GAUZE/BANDAGES/DRESSINGS) ×2 IMPLANT
BANDAGE ESMARK 6X9 LF (GAUZE/BANDAGES/DRESSINGS) ×1 IMPLANT
BENZOIN TINCTURE PRP APPL 2/3 (GAUZE/BANDAGES/DRESSINGS) ×2 IMPLANT
BLADE SAGITTAL 25.0X1.19X90 (BLADE) ×2 IMPLANT
BLADE SAW SAG 73X25 THK (BLADE) ×1
BLADE SAW SGTL 13X75X1.27 (BLADE) ×2 IMPLANT
BLADE SAW SGTL 73X25 THK (BLADE) IMPLANT
BNDG CMPR 9X6 STRL LF SNTH (GAUZE/BANDAGES/DRESSINGS) ×1
BNDG CMPR MED 10X6 ELC LF (GAUZE/BANDAGES/DRESSINGS) ×1
BNDG ELASTIC 6X10 VLCR STRL LF (GAUZE/BANDAGES/DRESSINGS) ×2 IMPLANT
BNDG ESMARK 6X9 LF (GAUZE/BANDAGES/DRESSINGS) ×2
BOWL SMART MIX CTS (DISPOSABLE) ×2 IMPLANT
CAP KNEE TOTAL 3 SIGMA ×1 IMPLANT
CEMENT HV SMART SET (Cement) ×4 IMPLANT
CLSR STERI-STRIP ANTIMIC 1/2X4 (GAUZE/BANDAGES/DRESSINGS) ×4 IMPLANT
COVER SURGICAL LIGHT HANDLE (MISCELLANEOUS) ×2 IMPLANT
CUFF TOURNIQUET SINGLE 34IN LL (TOURNIQUET CUFF) ×2 IMPLANT
CUFF TOURNIQUET SINGLE 44IN (TOURNIQUET CUFF) IMPLANT
DRAPE ORTHO SPLIT 77X108 STRL (DRAPES) ×4
DRAPE SURG ORHT 6 SPLT 77X108 (DRAPES) ×2 IMPLANT
DRAPE U-SHAPE 47X51 STRL (DRAPES) ×2 IMPLANT
DRSG ADAPTIC 3X8 NADH LF (GAUZE/BANDAGES/DRESSINGS) ×1 IMPLANT
DRSG PAD ABDOMINAL 8X10 ST (GAUZE/BANDAGES/DRESSINGS) ×4 IMPLANT
DURAPREP 26ML APPLICATOR (WOUND CARE) ×2 IMPLANT
ELECT REM PT RETURN 9FT ADLT (ELECTROSURGICAL) ×2
ELECTRODE REM PT RTRN 9FT ADLT (ELECTROSURGICAL) ×1 IMPLANT
FACESHIELD WRAPAROUND (MASK) ×4 IMPLANT
FACESHIELD WRAPAROUND OR TEAM (MASK) ×2 IMPLANT
GAUZE SPONGE 4X4 12PLY STRL (GAUZE/BANDAGES/DRESSINGS) ×3 IMPLANT
GAUZE XEROFORM 5X9 LF (GAUZE/BANDAGES/DRESSINGS) ×2 IMPLANT
GLOVE BIOGEL PI IND STRL 8 (GLOVE) ×2 IMPLANT
GLOVE BIOGEL PI INDICATOR 8 (GLOVE) ×2
GLOVE ORTHO TXT STRL SZ7.5 (GLOVE) ×4 IMPLANT
GOWN STRL REUS W/ TWL LRG LVL3 (GOWN DISPOSABLE) ×1 IMPLANT
GOWN STRL REUS W/ TWL XL LVL3 (GOWN DISPOSABLE) ×1 IMPLANT
GOWN STRL REUS W/TWL 2XL LVL3 (GOWN DISPOSABLE) ×2 IMPLANT
GOWN STRL REUS W/TWL LRG LVL3 (GOWN DISPOSABLE) ×2
GOWN STRL REUS W/TWL XL LVL3 (GOWN DISPOSABLE) ×2
HANDPIECE INTERPULSE COAX TIP (DISPOSABLE) ×2
KIT BASIN OR (CUSTOM PROCEDURE TRAY) ×2 IMPLANT
KIT ROOM TURNOVER OR (KITS) ×2 IMPLANT
MANIFOLD NEPTUNE II (INSTRUMENTS) ×2 IMPLANT
MARKER SPHERE PSV REFLC THRD 5 (MARKER) ×8 IMPLANT
NDL HYPO 25GX1X1/2 BEV (NEEDLE) ×1 IMPLANT
NEEDLE HYPO 25GX1X1/2 BEV (NEEDLE) ×2 IMPLANT
NS IRRIG 1000ML POUR BTL (IV SOLUTION) ×2 IMPLANT
PACK TOTAL JOINT (CUSTOM PROCEDURE TRAY) ×2 IMPLANT
PACK UNIVERSAL I (CUSTOM PROCEDURE TRAY) ×2 IMPLANT
PAD ARMBOARD 7.5X6 YLW CONV (MISCELLANEOUS) ×4 IMPLANT
PAD CAST 4YDX4 CTTN HI CHSV (CAST SUPPLIES) ×1 IMPLANT
PADDING CAST COTTON 4X4 STRL (CAST SUPPLIES) ×2
PADDING CAST COTTON 6X4 STRL (CAST SUPPLIES) ×3 IMPLANT
PIN SCHANZ 4MM 130MM (PIN) ×8 IMPLANT
SET HNDPC FAN SPRY TIP SCT (DISPOSABLE) ×1 IMPLANT
STAPLER VISISTAT 35W (STAPLE) IMPLANT
SUCTION FRAZIER HANDLE 10FR (MISCELLANEOUS) ×1
SUCTION TUBE FRAZIER 10FR DISP (MISCELLANEOUS) ×1 IMPLANT
SUT VIC AB 1 CTX 36 (SUTURE) ×4
SUT VIC AB 1 CTX36XBRD ANBCTR (SUTURE) ×2 IMPLANT
SUT VIC AB 2-0 CT1 27 (SUTURE) ×4
SUT VIC AB 2-0 CT1 TAPERPNT 27 (SUTURE) ×2 IMPLANT
SUT VIC AB 3-0 X1 27 (SUTURE) ×4 IMPLANT
SYR CONTROL 10ML LL (SYRINGE) ×2 IMPLANT
TOWEL OR 17X24 6PK STRL BLUE (TOWEL DISPOSABLE) ×2 IMPLANT
TOWEL OR 17X26 10 PK STRL BLUE (TOWEL DISPOSABLE) ×2 IMPLANT
WATER STERILE IRR 1000ML POUR (IV SOLUTION) ×2 IMPLANT

## 2015-12-22 NOTE — Anesthesia Postprocedure Evaluation (Signed)
Anesthesia Post Note  Patient: Alyssa [name]  Procedure(s) Performed: Procedure(s) (LRB): COMPUTER ASSISTED TOTAL KNEE ARTHROPLASTY (Right)  Patient location during evaluation: PACU Anesthesia Type: General and Regional Level of consciousness: sedated and patient cooperative Pain management: pain level controlled Vital Signs Assessment: post-procedure vital signs reviewed and stable Respiratory status: spontaneous breathing Cardiovascular status: stable Anesthetic complications: no    Last Vitals:  Filed Vitals:   12/22/15 1545 12/22/15 1600  BP: 114/73 124/75  Pulse: 75 67  Temp:    Resp: 12 16    Last Pain:  Filed Vitals:   12/22/15 1606  PainSc: 5         RLE Motor Response: Purposeful movement (12/22/15 1600) RLE Sensation: Full sensation (12/22/15 1600)      Nolon Nations

## 2015-12-22 NOTE — Brief Op Note (Signed)
12/22/2015  3:54 PM  PATIENT:  Alyssa [name]  65 y.o. female  PRE-OPERATIVE DIAGNOSIS:  Osteoarthritis Right Knee  POST-OPERATIVE DIAGNOSIS:  Osteoarthritis Right Knee  PROCEDURE:  Procedure(s): COMPUTER ASSISTED TOTAL KNEE ARTHROPLASTY (Right)  SURGEON:  Surgeon(s) and Role:    * Marybelle Killings, MD - Primary  PHYSICIAN ASSISTANT: Benjiman Core pa-c    ANESTHESIA:   general  EBL:  Total I/O In: 1200 [I.V.:1200] Out: 200 [Blood:200]  BLOOD ADMINISTERED:none  DRAINS: none   LOCAL MEDICATIONS USED:  MARCAINE     SPECIMEN:  No Specimen  DISPOSITION OF SPECIMEN:  N/A  COUNTS:  YES  TOURNIQUET:   Total Tourniquet Time Documented: Thigh (Right) - 63 minutes Total: Thigh (Right) - 63 minutes   DICTATION: .Viviann Spare Dictation  PLAN OF CARE: Admit to inpatient   PATIENT DISPOSITION:  PACU - hemodynamically stable.

## 2015-12-22 NOTE — Interval H&P Note (Signed)
History and Physical Interval Note:  12/22/2015 12:49 PM  Alyssa [name]  has presented today for surgery, with the diagnosis of Osteoarthritis Right Knee  The various methods of treatment have been discussed with the patient and family. After consideration of risks, benefits and other options for treatment, the patient has consented to  Procedure(s): COMPUTER ASSISTED TOTAL KNEE ARTHROPLASTY (Right) as a surgical intervention .  The patient's history has been reviewed, patient examined, no change in status, stable for surgery.  I have reviewed the patient's chart and labs.  Questions were answered to the patient's satisfaction.     YATES,MARK C

## 2015-12-22 NOTE — Transfer of Care (Signed)
Immediate Anesthesia Transfer of Care Note  Patient: Alyssa [name]  Procedure(s) Performed: Procedure(s): COMPUTER ASSISTED TOTAL KNEE ARTHROPLASTY (Right)  Patient Location: PACU  Anesthesia Type:GA combined with regional for post-op pain  Level of Consciousness: awake, alert , oriented and patient cooperative  Airway & Oxygen Therapy: Patient Spontanous Breathing and Patient connected to nasal cannula oxygen  Post-op Assessment: Report given to RN and Post -op Vital signs reviewed and stable  Post vital signs: Reviewed and stable  Last Vitals:  Filed Vitals:   12/22/15 1240 12/22/15 1532  BP: 149/68 118/63  Pulse: 70 80  Temp:  36.1 C  Resp: 12 12    Last Pain: There were no vitals filed for this visit.    Patients Stated Pain Goal: 4 (62/83/15 1761)  Complications: No apparent anesthesia complications

## 2015-12-22 NOTE — Anesthesia Preprocedure Evaluation (Addendum)
Anesthesia Evaluation  Patient identified by MRN, date of birth, ID band Patient awake    Reviewed: Allergy & Precautions, H&P , NPO status , Patient's Chart, lab work & pertinent test results  History of Anesthesia Complications Negative for: history of anesthetic complications  Airway Mallampati: II  TM Distance: >3 FB Neck ROM: Full    Dental  (+) Teeth Intact, Dental Advisory Given   Pulmonary Recent URI ,    Pulmonary exam normal breath sounds clear to auscultation       Cardiovascular hypertension, Pt. on medications + Peripheral Vascular Disease  Normal cardiovascular exam Rhythm:Regular Rate:Normal     Neuro/Psych  Headaches, PSYCHIATRIC DISORDERS Post concussive syndrome. Symptoms include difficulty with date/time and word ordering.   GI/Hepatic Neg liver ROS, GERD  ,  Endo/Other  diabetes, Type 2Hypothyroidism Morbid obesity  Renal/GU negative Renal ROS     Musculoskeletal   Abdominal   Peds  Hematology PE following previous surgery.   Anesthesia Other Findings   Reproductive/Obstetrics                             Anesthesia Physical  Anesthesia Plan  ASA: III  Anesthesia Plan: Regional and Spinal   Post-op Pain Management:    Induction: Intravenous  Airway Management Planned:   Additional Equipment:   Intra-op Plan:   Post-operative Plan:   Informed Consent: I have reviewed the patients History and Physical, chart, labs and discussed the procedure including the risks, benefits and alternatives for the proposed anesthesia with the patient or authorized representative who has indicated his/her understanding and acceptance.   Dental advisory given  Plan Discussed with: CRNA  Anesthesia Plan Comments:        Anesthesia Quick Evaluation

## 2015-12-22 NOTE — Anesthesia Procedure Notes (Addendum)
Anesthesia Regional Block:  Adductor canal block  Pre-Anesthetic Checklist: ,, timeout performed, Correct Patient, Correct Site, Correct Laterality, Correct Procedure, Correct Position, site marked, Risks and benefits discussed, Surgical consent,  Pre-op evaluation,  Post-op pain management  Laterality: Right  Prep: chloraprep       Needles:  Injection technique: Single-shot  Needle Type: Stimiplex     Needle Length: 9cm 9 cm Needle Gauge: 21 and 21 G    Additional Needles:  Procedures: ultrasound guided (picture in chart) and nerve stimulator Adductor canal block  Nerve Stimulator or Paresthesia:  Response: Petellar snap, 0.8 mA,   Additional Responses:   Narrative:  Injection made incrementally with aspirations every 5 mL.  Performed by: Personally  Anesthesiologist: Nolon Nations  Additional Notes: BP cuff, EKG monitors applied. Sedation begun. Artery and nerve location verified with U/S and anesthetic injected incrementally, slowly, and after negative aspirations under direct u/s guidance. Good fascial /perineural spread. Tolerated well.   Procedure Name: Intubation Date/Time: 12/22/2015 1:09 PM Performed by: Salli Quarry Isra Lindy Pre-anesthesia Checklist: Patient identified, Emergency Drugs available, Suction available and Patient being monitored Patient Re-evaluated:Patient Re-evaluated prior to inductionOxygen Delivery Method: Circle System Utilized Preoxygenation: Pre-oxygenation with 100% oxygen Intubation Type: IV induction Ventilation: Mask ventilation without difficulty Laryngoscope Size: Mac and 3 Grade View: Grade II Tube type: Oral Tube size: 7.0 mm Number of attempts: 1 Airway Equipment and Method: Stylet Placement Confirmation: ETT inserted through vocal cords under direct vision,  positive ETCO2 and breath sounds checked- equal and bilateral Secured at: 22 cm Tube secured with: Tape Dental Injury: Teeth and Oropharynx as per pre-operative  assessment

## 2015-12-22 NOTE — Progress Notes (Signed)
Orthopedic Tech Progress Note Patient Details:  Alyssa [name] 1950-09-19 [id number]  CPM Right Knee CPM Right Knee: On Right Knee Flexion (Degrees): 90 Right Knee Extension (Degrees): 0 Additional Comments: on at 1608  Ortho Devices Type of Ortho Device: Knee Immobilizer Ortho Device/Splint Location: RLE  Ortho Device/Splint Interventions: Ordered   Braulio Bosch 12/22/2015, 4:16 PM

## 2015-12-22 NOTE — Progress Notes (Signed)
CRNA informed of itching and red areas noted to her right wrist, arm and neck, and informed Benadryl was given as ordered.

## 2015-12-22 NOTE — Progress Notes (Signed)
Patient states her last void was approx 1020 this am and had remained NPO since midnight. Pt states she has no urge to void at this time. Bladder scan revealed 176 ml. Call out to phy for further orders.

## 2015-12-22 NOTE — Progress Notes (Addendum)
C/o itching at her right wrist and her neck small red areas noted to right wrist, and inner forearm. Dr Lissa Hoard called and informed with new orders noted, also Dr Lorin Mercy in to see her and informed.

## 2015-12-22 NOTE — Addendum Note (Signed)
Addendum  created 12/22/15 1733 by Glyn Ade, CRNA   Modules edited: Anesthesia Medication Administration

## 2015-12-22 NOTE — Progress Notes (Signed)
Spoke with pharmacy regarding rescheduling vancomycin that is due at 1600. Time is now 1723. Reporting nurse from PACU states that bolus dose of Vanc was given at noon in OR. Pharmacy states they will evaluate the times and reschedule.

## 2015-12-22 NOTE — H&P (Signed)
TOTAL KNEE ADMISSION H&P  Patient is being admitted for right total knee arthroplasty.  Subjective:  Chief Complaint:right knee pain.  HPI: Alyssa [name], 65 y.o. female, has a history of pain and functional disability in the right knee due to arthritis and has failed non-surgical conservative treatments for greater than 12 weeks to includeNSAID's and/or analgesics, corticosteriod injections, use of assistive devices, weight reduction as appropriate and activity modification.  Onset of symptoms was gradual, starting 10 years ago with gradually worsening course since that time.   Patient currently rates pain in the right knee(s) at 10 out of 10 with activity. Patient has worsening of pain with activity and weight bearing, pain that interferes with activities of daily living, crepitus and joint swelling.  Patient has evidence of subchondral sclerosis, periarticular osteophytes and joint space narrowing by imaging studies.  There is no active infection.  Patient Active Problem List   Diagnosis Date Noted  . Hepatic steatosis 02/09/2015  . Hot flashes 09/09/2014  . Osteopenia 09/09/2014  . Breast cancer of upper-inner quadrant of left female breast ([place]) 07/23/2013  . Memory loss 07/14/2013  . Headache 07/14/2013  . Other malaise and fatigue 07/14/2013  . Acute pulmonary embolism ([place]) 10/07/2011  . Leukocytosis 10/07/2011  . Thyroid mass, left inferior lobe, 3.6cm [id number] 10/07/2011  . Hyponatremia 10/07/2011  . Obesity (BMI 30-39.9) 08/07/2011  . Post concussion syndrome    Past Medical History  Diagnosis Date  . Hyperlipidemia   . Thyroid disease   . Abdominal pain   . Ventral hernia   . Hypertension     PCP DR Nolon Rod  . Recurrent upper respiratory infection (URI)     FLU 08/23/11-08/29/11 then URI 08/29/11- 09/08/11- S/P zpack and steroids-  improved      no cough or congestion  . Hypothyroidism   . Hernia, incisional periumbilical, incarcerated 8/92/1194  . Thyroid mass, left  inferior lobe, 3.6cm [id number] 10/07/2011  . Pulmonary embolism ([place]) 2013  . Hot flashes   . Peripheral vascular disease ([place])     pulmonary embolis-still have some  . Cancer ([place])     breast  . Breast cancer ([place]) 06/2013    left upper inner  . Hx of radiation therapy 09/09/13- 10/13/13    left breast 5000 cGy in 25 sessions, no boost  . Post concussion syndrome     4 yrs ago- sees [facility]- Dr Leonie Man every 6 months-   has sleep study 2011 following accident but was neg per patient-  short term memory issues, dates and times  . Family history of adverse reaction to anesthesia     Patients mother had to be resusciated after having anesthesia  . Diabetes mellitus without complication ([place])     Takes Tradjenta  . GERD (gastroesophageal reflux disease)   . 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 Cholecystectomy  2006    lap chole, Dr. Bubba Camp  . Appendectomy  2002    lap appy.  Dr. Hassell Done  . Carpel tunnel      left in April 2011, right in June 2012  . Tarsal tunnel release      bilateral  . Cesarean section      x2  . Ventral hernia repair  09/29/2011    Procedure: LAPAROSCOPIC VENTRAL HERNIA;  Surgeon: Adin Hector, MD;  Location: [facility];  Service: General;  Laterality: N/A;  Laparoscopic Ventral Wall Hernia Repair with Mesh  .  Breast lumpectomy with needle localization and axillary sentinel lymph node bx Left 08/04/2013    Procedure: BREAST LUMPECTOMY WITH NEEDLE LOCALIZATION AND AXILLARY SENTINEL LYMPH NODE Biopsy x 3;  Surgeon: Rolm Bookbinder, MD;  Location: [facility];  Service: General;  Laterality: Left;  . Colonoscopy    . Hernia repair  09/15/11    Ventral w/mesh  . Thyroidectomy, partial      Prescriptions prior to admission  Medication Sig Dispense Refill Last Dose  . ALPRAZolam (XANAX) 0.5 MG tablet Take 0.5 mg by mouth at bedtime as needed for anxiety or sleep.    12/21/2015 at Unknown time  . cetirizine (ZYRTEC) 10  MG tablet Take 10 mg by mouth daily.   12/21/2015 at Unknown time  . Coenzyme Q10 (CO Q 10) 100 MG CAPS Take 200 mg by mouth daily.   Past Week at Unknown time  . divalproex (DEPAKOTE) 500 MG DR tablet Take 500 mg by mouth daily.   12/21/2015 at Unknown time  . exemestane (AROMASIN) 25 MG tablet Take 1 tablet (25 mg total) by mouth daily after breakfast. 90 tablet 4 12/21/2015 at Unknown time  . hydrochlorothiazide (HYDRODIURIL) 25 MG tablet Take 25 mg by mouth at bedtime.    12/21/2015 at Unknown time  . linagliptin (TRADJENTA) 5 MG TABS tablet Take 5 mg by mouth daily.   12/21/2015 at Unknown time  . lisinopril (PRINIVIL,ZESTRIL) 5 MG tablet Take 5 mg by mouth daily.   12/21/2015 at Unknown time  . NATURE-THROID 97.5 MG TABS Take 48.75-97.5 mg by mouth 2 (two) times daily. 97.59m in the morning, 48.781min the evening   12/21/2015 at Unknown time  . rosuvastatin (CRESTOR) 5 MG tablet Take 5 mg by mouth daily.   Past Week at Unknown time  . traMADol (ULTRAM) 50 MG tablet TAKE 1 TABLET BY MOUTH EVERY 6 HOURS AS NEEDED 120 tablet 0 12/21/2015 at Unknown time   Allergies  Allergen Reactions  . Aleve [Naproxen Sodium] Hives, Itching and Swelling  . Aromasin [Exemestane] Anaphylaxis, Itching and Swelling    Pt stated "I can take this medication for a few months at a time and I take a benadryl when I feel it starting to come on" Patient has facial swelling, itching, and throat swelling  . Letrozole Shortness Of Breath, Rash and Other (See Comments)    Swelling - feet, eyes,hands;   Speech garbled  . Penicillins Hives, Swelling and Rash    All over the body. Has patient had a PCN reaction causing immediate rash, facial/tongue/throat swelling, SOB or lightheadedness with hypotension: Yes Has patient had a PCN reaction causing severe rash involving mucus membranes or skin necrosis: No Has patient had a PCN reaction that required hospitalization No Has patient had a PCN reaction occurring within the last 10  years: No If all of the above answers are "NO", then may proceed with Cephalosporin use.   . Codeine Other (See Comments)    hallucinations  . Other Swelling    Swelling of  Eyes   Wheat ,oranges banana, zucchini,cayenne,chili  Peppers, sweet potatoes, pumpkin  . Percocet [Oxycodone-Acetaminophen] Other (See Comments)    Causes syncope day after medication has been taken.  . Vicodin [Hydrocodone-Acetaminophen] Other (See Comments)    Causes syncope day after medication has been taken.  . Prednisone Other (See Comments)    Unknown    Social History  Substance Use Topics  . Smoking status: Never Smoker   . Smokeless tobacco: Never Used  .  Alcohol Use: No    Family History  Problem Relation Age of Onset  . Malignant hyperthermia Mother   . Heart attack Father   . Stroke Father   . Diabetes      Grandmother  . Other      respiratory- Grandmother  . Cancer Maternal Grandmother 66    breast     Review of Systems  Constitutional: Negative.   HENT: Negative.   Respiratory: Negative.   Cardiovascular: Negative.   Gastrointestinal: Negative.   Genitourinary: Negative.   Musculoskeletal: Positive for joint pain.  Neurological: Negative.   Psychiatric/Behavioral: Negative.     Objective:  Physical Exam  Constitutional: She is oriented to person, place, and time. No distress.  HENT:  Head: Atraumatic.  Eyes: EOM are normal.  Neck: Normal range of motion.  Respiratory: No respiratory distress.  GI: She exhibits no distension.  Musculoskeletal: She exhibits tenderness.  Neurological: She is alert and oriented to person, place, and time.  Skin: Skin is warm and dry.  Psychiatric: She has a normal mood and affect.    Vital signs in last 24 hours: Temp:  [97.8 F (36.6 C)] 97.8 F (36.6 C) (05/31 1038) Pulse Rate:  [64] 64 (05/31 1038) Resp:  [20] 20 (05/31 1038) BP: (180)/(70) 180/70 mmHg (05/31 1038) SpO2:  [98 %] 98 % (05/31 1038) Weight:  [107.502 kg (237 lb)]  107.502 kg (237 lb) (05/31 1050)  Labs:   Estimated body mass index is 42.63 kg/(m^2) as calculated from the following:   Height as of this encounter: 5' 2.5" (1.588 m).   Weight as of this encounter: 107.502 kg (237 lb).   Imaging Review Plain radiographs demonstrate moderate degenerative joint disease of the right knee(s). The overall alignment ismild varus. The bone quality appears to be good for age and reported activity level.  Assessment/Plan:  End stage arthritis, right knee   The patient history, physical examination, clinical judgment of the provider and imaging studies are consistent with end stage degenerative joint disease of the right knee(s) and total knee arthroplasty is deemed medically necessary. The treatment options including medical management, injection therapy arthroscopy and arthroplasty were discussed at length. The risks and benefits of total knee arthroplasty were presented and reviewed. The risks due to aseptic loosening, infection, stiffness, patella tracking problems, thromboembolic complications and other imponderables were discussed. The patient acknowledged the explanation, agreed to proceed with the plan and consent was signed. Patient is being admitted for inpatient treatment for surgery, pain control, PT, OT, prophylactic antibiotics, VTE prophylaxis, progressive ambulation and ADL's and discharge planning. The patient is planning to be discharged home with home health services

## 2015-12-23 ENCOUNTER — Encounter (HOSPITAL_COMMUNITY): Payer: Self-pay | Admitting: Orthopaedic Surgery

## 2015-12-23 LAB — CBC
HCT: 38.9 % (ref 36.0–46.0)
HEMOGLOBIN: 12.1 g/dL (ref 12.0–15.0)
MCH: 27.7 pg (ref 26.0–34.0)
MCHC: 31.1 g/dL (ref 30.0–36.0)
MCV: 89 fL (ref 78.0–100.0)
PLATELETS: 204 10*3/uL (ref 150–400)
RBC: 4.37 MIL/uL (ref 3.87–5.11)
RDW: 13.3 % (ref 11.5–15.5)
WBC: 8.3 10*3/uL (ref 4.0–10.5)

## 2015-12-23 LAB — BASIC METABOLIC PANEL
Anion gap: 8 (ref 5–15)
BUN: 8 mg/dL (ref 6–20)
CALCIUM: 8.2 mg/dL — AB (ref 8.9–10.3)
CO2: 27 mmol/L (ref 22–32)
CREATININE: 0.86 mg/dL (ref 0.44–1.00)
Chloride: 102 mmol/L (ref 101–111)
Glucose, Bld: 118 mg/dL — ABNORMAL HIGH (ref 65–99)
Potassium: 4.1 mmol/L (ref 3.5–5.1)
SODIUM: 137 mmol/L (ref 135–145)

## 2015-12-23 LAB — GLUCOSE, CAPILLARY
GLUCOSE-CAPILLARY: 113 mg/dL — AB (ref 65–99)
GLUCOSE-CAPILLARY: 108 mg/dL — AB (ref 65–99)

## 2015-12-23 MED ORDER — HYDROCODONE-ACETAMINOPHEN 7.5-325 MG PO TABS
2.0000 | ORAL_TABLET | Freq: Four times a day (QID) | ORAL | Status: DC | PRN
  Administered 2015-12-23 – 2015-12-24 (×5): 2 via ORAL
  Filled 2015-12-23 (×5): qty 2

## 2015-12-23 MED ORDER — OXYCODONE-ACETAMINOPHEN 5-325 MG PO TABS
2.0000 | ORAL_TABLET | ORAL | Status: DC | PRN
  Administered 2015-12-23 – 2015-12-25 (×8): 2 via ORAL
  Filled 2015-12-23 (×8): qty 2

## 2015-12-23 NOTE — Progress Notes (Signed)
Orthopedic Tech Progress Note Patient Details:  Alyssa [name] 09/12/1950 [id number]  Patient ID: Alyssa [name], female   DOB: 1950/11/08, 65 y.o.   MRN: [id number] Applied cpm 0-45. This is all the pt could handle due to pain.  Karolee Stamps 12/23/2015, 5:51 AM

## 2015-12-23 NOTE — Evaluation (Signed)
Occupational Therapy Evaluation Patient Details Name: Alyssa [name] MRN: [id number] DOB: 1951/04/02 Today's Date: 12/23/2015    History of Present Illness Pt is a 65 y.o. female now s/p Rt TKA. PMH: osteopenia, breast cancer, PE (2013), post concussion syndrome, hypertension, PVD, diabetes.    Clinical Impression   Pt was admitted for the above surgery.  She will benefit from continued OT to complete family training and perform shower transfer. Goal is for min guard with this.    Follow Up Recommendations  No OT follow up;Supervision/Assistance - 24 hour    Equipment Recommendations  3 in 1 bedside comode    Recommendations for Other Services       Precautions / Restrictions Precautions Precautions: Knee;Fall Precaution Booklet Issued: Yes (comment) Precaution Comments: HEP provided, reviewed knee extension precautions Required Braces or Orthoses: Knee Immobilizer - Right Knee Immobilizer - Right: On at all times Restrictions Weight Bearing Restrictions: Yes RLE Weight Bearing: Weight bearing as tolerated      Mobility Bed Mobility Overal bed mobility: Needs Assistance           General bed mobility comments: oob  Transfers Overall transfer level: Needs assistance Equipment used: Rolling walker (2 wheeled) Transfers: Sit to/from Stand Sit to Stand: Min assist         General transfer comment: light support to stand and steady. Cues for UE/LE placement    Balance Overall balance assessment: Needs assistance Sitting-balance support: No upper extremity supported Sitting balance-Leahy Scale: Good     Standing balance support: Bilateral upper extremity supported Standing balance-Leahy Scale: Poor Standing balance comment: using rw for support                            ADL Overall ADL's : Needs assistance/impaired     Grooming: Wash/dry hands;Min guard;Standing       Lower Body Bathing: Minimal assistance;Sit to/from stand        Lower Body Dressing: Maximal assistance;Sit to/from stand   Toilet Transfer: Minimal assistance;Ambulation;BSC;RW   Toileting- Clothing Manipulation and Hygiene: Sit to/from stand;Minimal assistance         General ADL Comments: ambulated to bathroom to use commode. Pt is able to perform UB adls with set up. Recommended long netted sponge on a stick for bathing.  Mother will assist with LB adls--educated on reacher, if desired, but pt is not lifting leg independently. Reviewed precautions     Vision     Perception     Praxis      Pertinent Vitals/Pain Pain Assessment: 0-10 Pain Score: 2  Pain Location: R knee Pain Descriptors / Indicators: Aching Pain Intervention(s): Limited activity within patient's tolerance;Monitored during session;Premedicated before session;Repositioned;Ice applied     Hand Dominance     Extremity/Trunk Assessment Upper Extremity Assessment Upper Extremity Assessment: Generalized weakness LUE Deficits / Details: h/o breast CA on L, strength grossly 3+/5          Communication Communication Communication: No difficulties   Cognition Arousal/Alertness: Awake/alert Behavior During Therapy: WFL for tasks assessed/performed Overall Cognitive Status: Within Functional Limits for tasks assessed                     General Comments       Exercises       Shoulder Instructions      Home Living Family/patient expects to be discharged to:: Private residence Living Arrangements: Parent Available Help at Discharge: Family;Available 24 hours/day Type of  Home: House Home Access: Level entry     Home Layout: One level     Bathroom Shower/Tub: Occupational psychologist: Standard     Home Equipment: None   Additional Comments: Pt lives with her mother who will not be able to provide physical assist.       Prior Functioning/Environment Level of Independence: Independent             OT Diagnosis: Acute pain   OT  Problem List: Pain;Decreased activity tolerance;Decreased knowledge of use of DME or AE   OT Treatment/Interventions: Self-care/ADL training;DME and/or AE instruction;Patient/family education    OT Goals(Current goals can be found in the care plan section) Acute Rehab OT Goals Patient Stated Goal: return to independence. Walk without pain OT Goal Formulation: With patient Time For Goal Achievement: 12/30/15 Potential to Achieve Goals: Good ADL Goals Pt Will Transfer to Toilet: with supervision;ambulating;bedside commode Pt Will Perform Tub/Shower Transfer: Shower transfer;ambulating;with min guard assist;3 in 1 Additional ADL Goal #1: mother will be independent assisting with bed mobility and assisting with toilet transfers  OT Frequency: Min 2X/week   Barriers to D/C:            Co-evaluation              End of Session CPM Right Knee CPM Right Knee: Other (Comment)  Activity Tolerance: Patient tolerated treatment well Patient left: in chair;with call bell/phone within reach;with family/visitor present   Time: 3735-7897 OT Time Calculation (min): 22 min Charges:  OT General Charges $OT Visit: 1 Procedure OT Evaluation $OT Eval Low Complexity: 1 Procedure G-Codes:    Alyssa [name] 2015-12-25, 1:59 PM Lesle Chris, OTR/L [phone] Dec 25, 2015

## 2015-12-23 NOTE — Care Management Note (Signed)
Case Management Note  Patient Details  Name: TANIQUA ISSA MRN: [id number] Date of Birth: 1950-09-18  Subjective/Objective:   65 yr old female s/p right total knee arthroplasty.                 Action/Plan: case manager spoke with patient and her family concerning home health and DME needs. Choice was offered. Referral was called to Pietro Cassis, [facility] Liaison. Rolling walker and 3in1 were ordered.   Expected Discharge Date:     12/24/15             Expected Discharge Plan:   Home with Home Health  In-House Referral:     Discharge planning Services  CM Consult  Post Acute Care Choice:  Durable Medical Equipment, Home Health Choice offered to:  Patient  DME Arranged:  3-N-1, Walker rolling DME Agency:  [place]:  PT [place]:  [facility]  Status of Service:  Completed, signed off  Medicare Important Message Given:    Date Medicare IM Given:    Medicare IM give by:    Date Additional Medicare IM Given:    Additional Medicare Important Message give by:     If discussed at [place] of Stay Meetings, dates discussed:    Additional Comments:  Ninfa Meeker, RN 12/23/2015, 2:51 PM

## 2015-12-23 NOTE — Progress Notes (Signed)
Subjective: 1 Day Post-Op Procedure(s) (LRB): COMPUTER ASSISTED TOTAL KNEE ARTHROPLASTY (Right) Patient reports pain as 7 on 0-10 scale and 8 on 0-10 scale.    Objective: Vital signs in last 24 hours: Temp:  [97 F (36.1 C)-99.5 F (37.5 C)] 98.9 F (37.2 C) (06/01 0447) Pulse Rate:  [57-80] 70 (06/01 0447) Resp:  [10-22] 18 (06/01 0447) BP: (109-180)/(54-82) 110/58 mmHg (06/01 0447) SpO2:  [95 %-100 %] 100 % (06/01 0447) Weight:  [107.502 kg (237 lb)] 107.502 kg (237 lb) (05/31 1050)  Intake/Output from previous day: 05/31 0701 - 06/01 0700 In: 1200 [I.V.:1200] Out: 500 [Urine:300; Blood:200] Intake/Output this shift:     Recent Labs  12/23/15 0500  HGB 12.1    Recent Labs  12/23/15 0500  WBC 8.3  RBC 4.37  HCT 38.9  PLT 204    Recent Labs  12/23/15 0500  NA 137  K 4.1  CL 102  CO2 27  BUN 8  CREATININE 0.86  GLUCOSE 118*  CALCIUM 8.2*   No results for input(s): LABPT, INR in the last 72 hours.  Neurologically intact  Assessment/Plan: 1 Day Post-Op Procedure(s) (LRB): COMPUTER ASSISTED TOTAL KNEE ARTHROPLASTY (Right) Up with therapy  Pain not controlled . Will increase PO pain meds. rx ot po narcotics was 20 yrs ago and not a true allergy.   Akeylah Hendel C 12/23/2015, 7:53 AM

## 2015-12-23 NOTE — Evaluation (Signed)
Physical Therapy Evaluation Patient Details Name: Alyssa [name] MRN: [id number] DOB: Aug 04, 1950 Today's Date: 12/23/2015   History of Present Illness  Pt is a 65 y.o. female now s/p Rt TKA. PMH: osteopenia, breast cancer, PE (2013), post concussion syndrome, hypertension, PVD, diabetes.   Clinical Impression  Pt is s/p TKA resulting in the deficits listed below (see PT Problem List).  Pt will benefit from skilled PT to increase their independence and safety with mobility to allow discharge to home. Pt states that he mother will be available to assist her at home but will not be able to provide any physical assistance. Pt will need to be as independent as possible at D/C.       Follow Up Recommendations Home health PT;Supervision for mobility/OOB    Equipment Recommendations  Rolling walker with 5" wheels    Recommendations for Other Services       Precautions / Restrictions Precautions Precautions: Knee;Fall Precaution Booklet Issued: Yes (comment) Precaution Comments: HEP provided, reviewed knee extension precautions Required Braces or Orthoses: Knee Immobilizer - Right Knee Immobilizer - Right: On at all times Restrictions Weight Bearing Restrictions: Yes RLE Weight Bearing: Weight bearing as tolerated      Mobility  Bed Mobility Overal bed mobility: Needs Assistance Bed Mobility: Supine to Sit     Supine to sit: Mod assist     General bed mobility comments: assist provided with Rt LE and at trunk to fully get to sitting EOB. Pt using rail to assist.   Transfers Overall transfer level: Needs assistance Equipment used: Rolling walker (2 wheeled) Transfers: Sit to/from Stand Sit to Stand: Min assist;From elevated surface         General transfer comment: Pt states that she has an elevated bed at home.   Ambulation/Gait Ambulation/Gait assistance: Min guard Ambulation Distance (Feet): 12 Feet Assistive device: Rolling walker (2 wheeled) Gait  Pattern/deviations: Step-to pattern;Decreased weight shift to right;Decreased stance time - right Gait velocity: slow pattern   General Gait Details: cues for gait sequence, encouraging weightbearing through Rt LE  Stairs            Wheelchair Mobility    Modified Rankin (Stroke Patients Only)       Balance Overall balance assessment: Needs assistance Sitting-balance support: No upper extremity supported Sitting balance-Leahy Scale: Good     Standing balance support: Bilateral upper extremity supported Standing balance-Leahy Scale: Poor Standing balance comment: using rw for support                             Pertinent Vitals/Pain Pain Assessment: 0-10 Pain Score: 2  Pain Location: Rt knee Pain Descriptors / Indicators: Aching Pain Intervention(s): Limited activity within patient's tolerance;Monitored during session    Home Living Family/patient expects to be discharged to:: Private residence Living Arrangements: Parent Available Help at Discharge: Family;Available 24 hours/day Type of Home: House Home Access: Level entry     Home Layout: One level Home Equipment: None Additional Comments: Pt lives with her mother who will not be able to provide physical assist.     Prior Function Level of Independence: Independent               Hand Dominance        Extremity/Trunk Assessment   Upper Extremity Assessment: LUE deficits/detail       LUE Deficits / Details: Pt reports strength deficits due to previous breast cancer   Lower Extremity Assessment: RLE  deficits/detail RLE Deficits / Details: unable to perform SLR       Communication   Communication: No difficulties  Cognition Arousal/Alertness: Awake/alert Behavior During Therapy: WFL for tasks assessed/performed Overall Cognitive Status: Within Functional Limits for tasks assessed                      General Comments      Exercises        Assessment/Plan     PT Assessment Patient needs continued PT services  PT Diagnosis Difficulty walking   PT Problem List Decreased strength;Decreased range of motion;Decreased activity tolerance;Decreased balance;Decreased mobility  PT Treatment Interventions DME instruction;Gait training;Stair training;Functional mobility training;Therapeutic activities;Therapeutic exercise;Patient/family education   PT Goals (Current goals can be found in the Care Plan section) Acute Rehab PT Goals Patient Stated Goal: go home, walk without pain again PT Goal Formulation: With patient Time For Goal Achievement: 01/06/16 Potential to Achieve Goals: Good    Frequency 7X/week   Barriers to discharge        Co-evaluation               End of Session Equipment Utilized During Treatment: Gait belt;Right knee immobilizer Activity Tolerance: Patient tolerated treatment well Patient left: in chair;with call bell/phone within reach Nurse Communication: Mobility status;Weight bearing status         Time: 2725-3664 PT Time Calculation (min) (ACUTE ONLY): 28 min   Charges:   PT Evaluation $PT Eval Moderate Complexity: 1 Procedure PT Treatments $Gait Training: 8-22 mins   PT G Codes:        Cassell Clement, PT, CSCS Pager [phone] Office [phone]  12/23/2015, 10:59 AM

## 2015-12-23 NOTE — Progress Notes (Signed)
Physical Therapy Treatment Patient Details Name: Alyssa [name] MRN: [id number] DOB: 02-25-1951 Today's Date: 12/23/2015    History of Present Illness Pt is a 65 y.o. female now s/p Rt TKA. PMH: osteopenia, breast cancer, PE (2013), post concussion syndrome, hypertension, PVD, diabetes.     PT Comments    Pt making progress with PT, able to ambulate 40 feet with rw and min guard assistance. Limited ROM into knee flexion and extension. Will continue to follow and progress in anticipation of D/C to home with family support.   Follow Up Recommendations  Home health PT;Supervision for mobility/OOB     Equipment Recommendations  Rolling walker with 5" wheels    Recommendations for Other Services       Precautions / Restrictions Precautions Precautions: Knee;Fall Required Braces or Orthoses: Knee Immobilizer - Right Knee Immobilizer - Right: On at all times Restrictions Weight Bearing Restrictions: Yes RLE Weight Bearing: Weight bearing as tolerated    Mobility  Bed Mobility Overal bed mobility: Needs Assistance Bed Mobility: Sit to Supine       Sit to supine: Min assist   General bed mobility comments: assist with LLE  Transfers Overall transfer level: Needs assistance Equipment used: Rolling walker (2 wheeled) Transfers: Sit to/from Stand Sit to Stand: Min assist         General transfer comment: light support to stand and steady. Cues for UE/LE placement  Ambulation/Gait Ambulation/Gait assistance: Min guard Ambulation Distance (Feet): 40 Feet Assistive device: Rolling walker (2 wheeled) Gait Pattern/deviations: Step-to pattern Gait velocity: slow pattern   General Gait Details: encouraging weightbearing through Rt LE and even strides   Stairs            Wheelchair Mobility    Modified Rankin (Stroke Patients Only)       Balance Overall balance assessment: Needs assistance Sitting-balance support: No upper extremity supported Sitting  balance-Leahy Scale: Good     Standing balance support: Bilateral upper extremity supported Standing balance-Leahy Scale: Poor Standing balance comment: using rw                    Cognition Arousal/Alertness: Awake/alert Behavior During Therapy: WFL for tasks assessed/performed Overall Cognitive Status: Within Functional Limits for tasks assessed                      Exercises Total Joint Exercises Ankle Circles/Pumps: AROM;Both;15 reps Quad Sets: Strengthening;Right;10 reps Heel Slides: AAROM;Right;10 reps Straight Leg Raises: Strengthening;Right;10 reps (mod assist) Goniometric ROM: 8-41    General Comments        Pertinent Vitals/Pain Pain Assessment: Faces Pain Score: 2  Faces Pain Scale: Hurts little more Pain Location: Rt knee/thigh Pain Descriptors / Indicators: Aching Pain Intervention(s): Limited activity within patient's tolerance;Monitored during session    Home Living Family/patient expects to be discharged to:: Private residence Living Arrangements: Parent Available Help at Discharge: Family;Available 24 hours/day           Additional Comments: Pt lives with her mother who will not be able to provide physical assist.     Prior Function Level of Independence: Independent          PT Goals (current goals can now be found in the care plan section) Acute Rehab PT Goals Patient Stated Goal: go home PT Goal Formulation: With patient Time For Goal Achievement: 01/06/16 Potential to Achieve Goals: Good Progress towards PT goals: Progressing toward goals    Frequency  7X/week    PT Plan  Current plan remains appropriate    Co-evaluation             End of Session Equipment Utilized During Treatment: Gait belt;Right knee immobilizer Activity Tolerance: Patient tolerated treatment well Patient left: in bed;in CPM;with call bell/phone within reach;with family/visitor present;with SCD's reapplied     Time: 4784-1282 PT Time  Calculation (min) (ACUTE ONLY): 42 min  Charges:  $Gait Training: 23-37 mins $Therapeutic Exercise: 8-22 mins                    G Codes:      Cassell Clement, PT, CSCS Pager [phone] Office [phone]  12/23/2015, 4:20 PM

## 2015-12-23 NOTE — Op Note (Signed)
NAMECARTIER, MAPEL                ACCOUNT NO.:  [id number]  MEDICAL RECORD NO.:  [id number]  LOCATION:  5N11C                        FACILITY:  [facility]  PHYSICIAN:  Lindia Garms C. Lorin Mercy, M.D.    DATE OF BIRTH:  Jun 24, 1951  DATE OF PROCEDURE:  12/22/2015 DATE OF DISCHARGE:                              OPERATIVE REPORT   PREOPERATIVE DIAGNOSIS:  Right knee osteoarthritis.  POSTOPERATIVE DIAGNOSIS:  Right knee osteoarthritis.  PROCEDURE:  Right total knee arthroplasty.  SURGEON:  Kahlyn Shippey C. Lorin Mercy, M.D.  ASSISTANT:  Lanae Crumbly, P.A.-C, medically necessary and present for the entire procedure.  ANESTHESIA:  Preoperative adductor block plus general.  TOURNIQUET TIME:  1 hour and 4 minutes.  DRAINS:  None.  INDICATIONS:  A 65 year old female with progressive knee primary osteoarthritis.  She has been treated conservatively, intra-articular injections, activity modification, use of cane, anti-inflammatories without relief.  Marginal osteophytes, tricompartmental degenerative changes with primary osteoarthritis.  DESCRIPTION OF PROCEDURE:  After standard prepping and draping down to the ankle with proximal thigh tourniquet, usual split sheets, drapes, sterile skin marker, impervious stockinette had been applied, Coban and Betadine Steri-Drape.  Computer navigation was used for this case.  Pins were placed in midtibia bicortically and pins were placed inside the incision with medial parapatellar incision.  Models were generated for the tibia and femur.  An 8-mm was taken off the low side, 11.7 off the high side for the femur.  Sizing for the femur was 3, for the tibia, it was 2.5.  Box cuts and chamfer cuts were made on the femur and trials were inserted, after patella was prepared removing 10 mm of bone.  The trial showed extension and about 8 degrees of hyperextension.  12.5 trial gave tight fit.  There was good collateral balance.  Computer pins bicortical were removed.  Pulse lavage,  drying with the sponges.  Tibia was cemented first followed by femur and insertion of the poly and then 35-mm 3 pegged poly.  Femoral component was with lugs.  Knee reached full extension, hyperextended 2 degrees.  There was good flexion- extension balance.  After irrigation, after cement was hard at 15 minutes, all excess of cement had been removed.  Patellar tracking was good.  Standard closure with nonabsorbable and the medial parapatellar incision, 2-0 Vicryl in subcutaneous tissue, subcuticular closure, postop dressing, knee immobilizer was applied.  Instrument count and needle count were correct.     Adiya Selmer C. Lorin Mercy, M.D.     MCY/MEDQ  D:  12/22/2015  T:  12/23/2015  Job:  [id number]

## 2015-12-24 LAB — CBC
HCT: 36.5 % (ref 36.0–46.0)
HEMOGLOBIN: 11.1 g/dL — AB (ref 12.0–15.0)
MCH: 28 pg (ref 26.0–34.0)
MCHC: 30.4 g/dL (ref 30.0–36.0)
MCV: 92.2 fL (ref 78.0–100.0)
Platelets: 182 10*3/uL (ref 150–400)
RBC: 3.96 MIL/uL (ref 3.87–5.11)
RDW: 13.8 % (ref 11.5–15.5)
WBC: 8.2 10*3/uL (ref 4.0–10.5)

## 2015-12-24 LAB — GLUCOSE, CAPILLARY: GLUCOSE-CAPILLARY: 124 mg/dL — AB (ref 65–99)

## 2015-12-24 MED ORDER — HYDROCODONE-ACETAMINOPHEN 7.5-325 MG PO TABS: 1.0000 | ORAL_TABLET | Freq: Four times a day (QID) | ORAL | Status: DC | PRN

## 2015-12-24 MED ORDER — METHOCARBAMOL 500 MG PO TABS: 500.0000 mg | ORAL_TABLET | Freq: Four times a day (QID) | ORAL | Status: DC | PRN

## 2015-12-24 MED ORDER — MAGNESIUM CITRATE PO SOLN
0.5000 | Freq: Once | ORAL | Status: AC
  Administered 2015-12-24: 0.5 via ORAL
  Filled 2015-12-24: qty 296

## 2015-12-24 MED ORDER — DOCUSATE SODIUM 100 MG PO CAPS: 100.0000 mg | ORAL_CAPSULE | Freq: Two times a day (BID) | ORAL | Status: DC

## 2015-12-24 MED ORDER — ASPIRIN 325 MG PO TBEC: 325.0000 mg | DELAYED_RELEASE_TABLET | Freq: Every day | ORAL | Status: DC

## 2015-12-24 NOTE — Progress Notes (Addendum)
Physical Therapy Treatment Patient Details Name: Alyssa [name] MRN: [id number] DOB: 04-09-1951 Today's Date: 12/24/2015    History of Present Illness Pt is a 65 y.o. female now s/p Rt TKA. PMH: osteopenia, breast cancer, PE (2013), post concussion syndrome, hypertension, PVD, diabetes.     PT Comments    Pt continue to make gradual progress with PT but continues to be limited with gait tolerance. Pt able to ambulate 70 feet but needing extended time and cues. At this time the patient would benefit from further PT to progress her mobility and independence prior to D/C home. PT to continue to follow.   Follow Up Recommendations  Home health PT;Supervision for mobility/OOB     Equipment Recommendations  Rolling walker with 5" wheels    Recommendations for Other Services       Precautions / Restrictions Precautions Precautions: Knee;Fall Required Braces or Orthoses: Knee Immobilizer - Right Knee Immobilizer - Right: On at all times Restrictions Weight Bearing Restrictions: Yes RLE Weight Bearing: Weight bearing as tolerated    Mobility  Bed Mobility               General bed mobility comments: up in chair upon arrival  Transfers Overall transfer level: Needs assistance Equipment used: Rolling walker (2 wheeled) Transfers: Sit to/from Stand Sit to Stand: Min guard         General transfer comment: cues for hand position  Ambulation/Gait Ambulation/Gait assistance: Min guard Ambulation Distance (Feet): 70 Feet Assistive device: Rolling walker (2 wheeled) Gait Pattern/deviations: Step-to pattern Gait velocity: very slow pattern   General Gait Details: cues for weightbearing through Rt LE and increasing stride length.    Stairs            Wheelchair Mobility    Modified Rankin (Stroke Patients Only)       Balance Overall balance assessment: Needs assistance Sitting-balance support: No upper extremity supported Sitting balance-Leahy Scale: Good     Standing balance support: Bilateral upper extremity supported Standing balance-Leahy Scale: Poor Standing balance comment: using rw for support                    Cognition Arousal/Alertness: Awake/alert Behavior During Therapy: WFL for tasks assessed/performed Overall Cognitive Status: Within Functional Limits for tasks assessed                      Exercises Total Joint Exercises Ankle Circles/Pumps: AROM;Both;15 reps Quad Sets: Strengthening;Right;10 reps Heel Slides: AAROM;Right;15 reps Straight Leg Raises: Strengthening;Right;10 reps (mod assist)    General Comments        Pertinent Vitals/Pain Pain Assessment: 0-10 Pain Score: 4  Pain Location: Rt knee Pain Descriptors / Indicators: Aching;Sore Pain Intervention(s): Limited activity within patient's tolerance;Monitored during session    Home Living                      Prior Function            PT Goals (current goals can now be found in the care plan section) Acute Rehab PT Goals Patient Stated Goal: go home PT Goal Formulation: With patient Time For Goal Achievement: 01/06/16 Potential to Achieve Goals: Good Progress towards PT goals: Progressing toward goals    Frequency  7X/week    PT Plan Current plan remains appropriate    Co-evaluation             End of Session Equipment Utilized During Treatment: Gait belt;Right knee immobilizer  Activity Tolerance: Patient tolerated treatment well Patient left: in chair;with call bell/phone within reach     Time: 1541-1614 PT Time Calculation (min) (ACUTE ONLY): 33 min  Charges:  $Gait Training: 8-22 mins $Therapeutic Exercise: 8-22 mins                    G Codes:      Cassell Clement, PT, CSCS Pager [phone] Office [phone]  12/24/2015, 4:27 PM

## 2015-12-24 NOTE — Progress Notes (Signed)
Orthopedic Tech Progress Note Patient Details:  Alyssa [name] 27-Mar-1951 [id number]  Patient ID: Alyssa [name], female   DOB: 08/18/1950, 65 y.o.   MRN: [id number] i came to put pt in cpm she was up in the chair.  Karolee Stamps 12/24/2015, 12:59 PM

## 2015-12-24 NOTE — Discharge Instructions (Signed)
INSTRUCTIONS AFTER JOINT REPLACEMENT   o Remove items at home which could result in a fall. This includes throw rugs or furniture in walking pathways o ICE to the affected joint every three hours while awake for 30 minutes at a time, for at least the first 3-5 days, and then as needed for pain and swelling.  Continue to use ice for pain and swelling. You may notice swelling that will progress down to the foot and ankle.  This is normal after surgery.  Elevate your leg when you are not up walking on it.   o Continue to use the breathing machine you got in the hospital (incentive spirometer) which will help keep your temperature down.  It is common for your temperature to cycle up and down following surgery, especially at night when you are not up moving around and exerting yourself.  The breathing machine keeps your lungs expanded and your temperature down.   DIET:  As you were doing prior to hospitalization, we recommend a well-balanced diet.  DRESSING / WOUND CARE / SHOWERING  You may change your dressing 3-5 days after surgery.  Then change the dressing every day with sterile gauze.  Please use good hand washing techniques before changing the dressing.  Do not use any lotions or creams on the incision until instructed by your surgeon.  ACTIVITY  o Increase activity slowly as tolerated, but follow the weight bearing instructions below.   o No driving for 6 weeks or until further direction given by your physician.  You cannot drive while taking narcotics.  o No lifting or carrying greater than 10 lbs. until further directed by your surgeon. o Avoid periods of inactivity such as sitting longer than an hour when not asleep. This helps prevent blood clots.  o You may return to work once you are authorized by your doctor.     WEIGHT BEARING   Weight bearing as tolerated with assist device (walker, cane, etc) as directed, use it as long as suggested by your surgeon or therapist, typically at  least 4-6 weeks.   EXERCISES  Results after joint replacement surgery are often greatly improved when you follow the exercise, range of motion and muscle strengthening exercises prescribed by your doctor. Safety measures are also important to protect the joint from further injury. Any time any of these exercises cause you to have increased pain or swelling, decrease what you are doing until you are comfortable again and then slowly increase them. If you have problems or questions, call your caregiver or physical therapist for advice.   Rehabilitation is important following a joint replacement. After just a few days of immobilization, the muscles of the leg can become weakened and shrink (atrophy).  These exercises are designed to build up the tone and strength of the thigh and leg muscles and to improve motion. Often times heat used for twenty to thirty minutes before working out will loosen up your tissues and help with improving the range of motion but do not use heat for the first two weeks following surgery (sometimes heat can increase post-operative swelling).   These exercises can be done on a training (exercise) mat, on the floor, on a table or on a bed. Use whatever works the best and is most comfortable for you.    Use music or television while you are exercising so that the exercises are a pleasant break in your day. This will make your life better with the exercises acting as a break  in your routine that you can look forward to.   Perform all exercises about fifteen times, three times per day or as directed.  You should exercise both the operative leg and the other leg as well.  Exercises include:    Quad Sets - Tighten up the muscle on the front of the thigh (Quad) and hold for 5-10 seconds.    Straight Leg Raises - With your knee straight (if you were given a brace, keep it on), lift the leg to 60 degrees, hold for 3 seconds, and slowly lower the leg.  Perform this exercise against  resistance later as your leg gets stronger.   Leg Slides: Lying on your back, slowly slide your foot toward your buttocks, bending your knee up off the floor (only go as far as is comfortable). Then slowly slide your foot back down until your leg is flat on the floor again.   Angel Wings: Lying on your back spread your legs to the side as far apart as you can without causing discomfort.   Hamstring Strength:  Lying on your back, push your heel against the floor with your leg straight by tightening up the muscles of your buttocks.  Repeat, but this time bend your knee to a comfortable angle, and push your heel against the floor.  You may put a pillow under the heel to make it more comfortable if necessary.   A rehabilitation program following joint replacement surgery can speed recovery and prevent re-injury in the future due to weakened muscles. Contact your doctor or a physical therapist for more information on knee rehabilitation.    CONSTIPATION  Constipation is defined medically as fewer than three stools per week and severe constipation as less than one stool per week.  Even if you have a regular bowel pattern at home, your normal regimen is likely to be disrupted due to multiple reasons following surgery.  Combination of anesthesia, postoperative narcotics, change in appetite and fluid intake all can affect your bowels.   YOU MUST use at least one of the following options; they are listed in order of increasing strength to get the job done.  They are all available over the counter, and you may need to use some, POSSIBLY even all of these options:    Drink plenty of fluids (prune juice may be helpful) and high fiber foods Colace 100 mg by mouth twice a day  Senokot for constipation as directed and as needed Dulcolax (bisacodyl), take with full glass of water  Miralax (polyethylene glycol) once or twice a day as needed.  If you have tried all these things and are unable to have a bowel  movement in the first 3-4 days after surgery call either your surgeon or your primary doctor.    If you experience loose stools or diarrhea, hold the medications until you stool forms back up.  If your symptoms do not get better within 1 week or if they get worse, check with your doctor.  If you experience "the worst abdominal pain ever" or develop nausea or vomiting, please contact the office immediately for further recommendations for treatment.   ITCHING:  If you experience itching with your medications, try taking only a single pain pill, or even half a pain pill at a time.  You can also use Benadryl over the counter for itching or also to help with sleep.   TED HOSE STOCKINGS:  Use stockings on both legs until for at least 2 weeks or as  directed by physician office. They may be removed at night for sleeping.  MEDICATIONS:  See your medication summary on the After Visit Summary that nursing will review with you.  You may have some home medications which will be placed on hold until you complete the course of blood thinner medication.  It is important for you to complete the blood thinner medication as prescribed.  PRECAUTIONS:  If you experience chest pain or shortness of breath - call 911 immediately for transfer to the hospital emergency department.   If you develop a fever greater that 101 F, purulent drainage from wound, increased redness or drainage from wound, foul odor from the wound/dressing, or calf pain - CONTACT YOUR SURGEON.                                                   FOLLOW-UP APPOINTMENTS:  If you do not already have a post-op appointment, please call the office for an appointment to be seen by your surgeon.  Guidelines for how soon to be seen are listed in your After Visit Summary, but are typically between 1-4 weeks after surgery.  OTHER INSTRUCTIONS:   Knee Replacement:  Do not place pillow under knee, focus on keeping the knee straight while resting. CPM  instructions: 0-90 degrees, 2 hours in the morning, 2 hours in the afternoon, and 2 hours in the evening. Place foam block, curve side up under heel at all times except when in CPM or when walking.  DO NOT modify, tear, cut, or change the foam block in any way.  MAKE SURE YOU:   Understand these instructions.   Get help right away if you are not doing well or get worse.    Thank you for letting us be a part of your medical care team.  It is a privilege we respect greatly.  We hope these instructions will help you stay on track for a fast and full recovery!

## 2015-12-24 NOTE — Progress Notes (Signed)
OT Cancellation Note  Patient Details Name: ANN-MARIE KLUGE MRN: [id number] DOB: Jun 28, 1951   Cancelled Treatment:    Reason Eval/Treat Not Completed: Other (comment).  Attempted skilled OT. And reviewed current goals.  Pt. Declined need for shower stall transfer as she "feels it wont be a problem" and will be sponge bathing initially.  States she is having no issues with selfcare and toileting and had previously reviewed bed mobility with PT.  Agreeable to OT sign off as she states "i really think i'm fine".  Will alert OTR/L to sign off per pt. Report of no current OT needs.    Janice Coffin, COTA/L 12/24/2015, 11:36 AM  I agree with the above.  Will sign off. [place], [place] [phone] 12/24/2015

## 2015-12-24 NOTE — Progress Notes (Signed)
Subjective: Doing well.  Pain controlled. No bowel movement x a few days. States that her 64 yo mother is at home with her.    Objective: Vital signs in last 24 hours: Temp:  [98.1 F (36.7 C)-100 F (37.8 C)] 100 F (37.8 C) (06/02 0430) Pulse Rate:  [80-91] 81 (06/02 0904) Resp:  [18-20] 20 (06/02 0904) BP: (106-125)/(47-62) 108/47 mmHg (06/02 0904) SpO2:  [92 %-98 %] 92 % (06/02 0904)  Intake/Output from previous day: 06/01 0701 - 06/02 0700 In: 870 [P.O.:870] Out: -  Intake/Output this shift:     Recent Labs  12/23/15 0500 12/24/15 0416  HGB 12.1 11.1*    Recent Labs  12/23/15 0500 12/24/15 0416  WBC 8.3 8.2  RBC 4.37 3.96  HCT 38.9 36.5  PLT 204 182    Recent Labs  12/23/15 0500  NA 137  K 4.1  CL 102  CO2 27  BUN 8  CREATININE 0.86  GLUCOSE 118*  CALCIUM 8.2*   No results for input(s): LABPT, INR in the last 72 hours.  Exam:  Alert and oriented.  Knee wound looks good.  No drainage or signs of infection.  Calf nontender, NVI.    Assessment/Plan: Therapist is seeing patient now.  Will see how she does.  D/c home today if moving well, but patient has some concern due to having her 14 yo mother assisting.  Scripts on chart for norco, robaxin, aspirin, and colace.  Will give mag citrate now.     Tiajuana Leppanen M 12/24/2015, 9:33 AM

## 2015-12-24 NOTE — Progress Notes (Signed)
Physical Therapy Treatment Patient Details Name: Alyssa [name] MRN: [id number] DOB: 1951/04/22 Today's Date: 12/24/2015    History of Present Illness Pt is a 65 y.o. female now s/p Rt TKA. PMH: osteopenia, breast cancer, PE (2013), post concussion syndrome, hypertension, PVD, diabetes.     PT Comments    Pt making gradual progress with PT, able to ambulate 60 feet with rw but very slow pattern. PT to continue to follow to progress ambulation and review of HEP. Pt confirms that her only help will be her mother who will not be able to provide any physical support.   Follow Up Recommendations  Home health PT;Supervision for mobility/OOB     Equipment Recommendations  Rolling walker with 5" wheels    Recommendations for Other Services       Precautions / Restrictions Precautions Precautions: Knee;Fall Required Braces or Orthoses: Knee Immobilizer - Right Knee Immobilizer - Right: On at all times Restrictions Weight Bearing Restrictions: Yes RLE Weight Bearing: Weight bearing as tolerated    Mobility  Bed Mobility Overal bed mobility: Needs Assistance Bed Mobility: Supine to Sit     Supine to sit: Supervision     General bed mobility comments: bed flat no rails, cues needed for pt to assist Rt LE with arms.   Transfers Overall transfer level: Needs assistance Equipment used: Rolling walker (2 wheeled) Transfers: Sit to/from Stand Sit to Stand: Min guard         General transfer comment: reminder cues for hand position  Ambulation/Gait Ambulation/Gait assistance: Min guard Ambulation Distance (Feet): 60 Feet Assistive device: Rolling walker (2 wheeled) Gait Pattern/deviations: Step-to pattern Gait velocity: slow pattern   General Gait Details: cues for weightbearing through Rt LE and increasing stride length.    Stairs            Wheelchair Mobility    Modified Rankin (Stroke Patients Only)       Balance Overall balance assessment: Needs  assistance Sitting-balance support: No upper extremity supported Sitting balance-Leahy Scale: Good     Standing balance support: Bilateral upper extremity supported Standing balance-Leahy Scale: Poor Standing balance comment: using rw for assist                    Cognition Arousal/Alertness: Awake/alert Behavior During Therapy: WFL for tasks assessed/performed Overall Cognitive Status: Within Functional Limits for tasks assessed                      Exercises Total Joint Exercises Ankle Circles/Pumps: AROM;Both;15 reps Quad Sets: Strengthening;Right;10 reps Short Arc Quad: Strengthening;Right;10 reps Heel Slides: AAROM;Right;10 reps Straight Leg Raises: Strengthening;Right;10 reps (mod assist) Goniometric ROM: knee flexion approx. 30 degrees    General Comments        Pertinent Vitals/Pain Pain Assessment: 0-10 Pain Score: 5  Pain Location: Rt knee and thigh Pain Descriptors / Indicators: Aching Pain Intervention(s): Limited activity within patient's tolerance;Monitored during session    Home Living                      Prior Function            PT Goals (current goals can now be found in the care plan section) Acute Rehab PT Goals Patient Stated Goal: go home PT Goal Formulation: With patient Time For Goal Achievement: 01/06/16 Potential to Achieve Goals: Good Progress towards PT goals: Progressing toward goals    Frequency  7X/week    PT Plan Current plan  remains appropriate    Co-evaluation             End of Session Equipment Utilized During Treatment: Gait belt;Right knee immobilizer Activity Tolerance: Patient tolerated treatment well Patient left: in chair;with call bell/phone within reach     Time: 0919-1009 PT Time Calculation (min) (ACUTE ONLY): 50 min  Charges:  $Gait Training: 8-22 mins $Therapeutic Exercise: 23-37 mins                    G Codes:      Cassell Clement, PT, CSCS Pager [phone] Office [phone]  12/24/2015, 12:11 PM

## 2015-12-25 LAB — CBC
HEMATOCRIT: 38.4 % (ref 36.0–46.0)
Hemoglobin: 11.7 g/dL — ABNORMAL LOW (ref 12.0–15.0)
MCH: 27.6 pg (ref 26.0–34.0)
MCHC: 30.5 g/dL (ref 30.0–36.0)
MCV: 90.6 fL (ref 78.0–100.0)
Platelets: 216 10*3/uL (ref 150–400)
RBC: 4.24 MIL/uL (ref 3.87–5.11)
RDW: 13.7 % (ref 11.5–15.5)
WBC: 8.6 10*3/uL (ref 4.0–10.5)

## 2015-12-25 NOTE — Progress Notes (Signed)
Patient ID: Alyssa [name], female   DOB: [birth year], 65 y.o.   MRN: [id number] Patient progressing well with therapy plan for discharge to home prescriptions provided plan for home health physical therapy follow-up in the office in 2 weeks.

## 2015-12-25 NOTE — Progress Notes (Signed)
Physical Therapy Treatment Patient Details Name: Alyssa [name] MRN: [id number] DOB: 04/08/1951 Today's Date: 12/25/2015    History of Present Illness Pt is a 65 y.o. female now s/p Rt TKA. PMH: osteopenia, breast cancer, PE (2013), post concussion syndrome, hypertension, PVD, diabetes.     PT Comments    Pt has been up walking with family this am, therefore focused on knee strengthening and range of motion this session. Pt with improved knee flexion from previous measurements. Patient safe to D/C from a mobility standpoint based on progression towards goals set on PT eval.    Follow Up Recommendations  Home health PT;Supervision for mobility/OOB     Equipment Recommendations  Rolling walker with 5" wheels    Precautions / Restrictions Precautions Precautions: Knee;Fall Precaution Comments:  reviewed knee extension precautions Required Braces or Orthoses: Knee Immobilizer - Right Knee Immobilizer - Right: On at all times Restrictions Weight Bearing Restrictions: Yes RLE Weight Bearing: Weight bearing as tolerated    Cognition Arousal/Alertness: Awake/alert Behavior During Therapy: WFL for tasks assessed/performed Overall Cognitive Status: Within Functional Limits for tasks assessed           Exercises Total Joint Exercises Ankle Circles/Pumps: AROM;Both;10 reps;Supine Quad Sets: AROM;Strengthening;Right;10 reps;Supine Short Arc Quad: AAROM;Strengthening;Right;10 reps;Supine Heel Slides: AAROM;Strengthening;Right;10 reps;Supine Straight Leg Raises: AAROM;Strengthening;Right;10 reps;Supine Knee Flexion: AAROM;AROM;Strengthening;Right;10 reps;Seated;Limitations Knee Flexion Limitations: pt actively moving into flexion as much as possible (sliding foot back) with PTA adding extra with gentle overpressure at end of pt's active range Goniometric ROM: seated right knee flexion after above seated knee flexion exercises 50 degrees without any compensatory motions noted.      Pertinent Vitals/Pain Pain Assessment: 0-10 Pain Score: 5  Pain Location: right knee Pain Descriptors / Indicators: Aching;Sore Pain Intervention(s): Limited activity within patient's tolerance;Monitored during session;Premedicated before session     PT Goals (current goals can now be found in the care plan section) Acute Rehab PT Goals Patient Stated Goal: go home PT Goal Formulation: With patient Time For Goal Achievement: 01/06/16 Potential to Achieve Goals: Good Progress towards PT goals: Progressing toward goals    Frequency  7X/week    PT Plan Current plan remains appropriate    End of Session Equipment Utilized During Treatment: Gait belt;Right knee immobilizer Activity Tolerance: Patient tolerated treatment well Patient left: in chair;with call bell/phone within reach     Time: 0952-1012 PT Time Calculation (min) (ACUTE ONLY): 20 min  Charges:  $Therapeutic Exercise: 8-22 mins           Willow Ora 12/25/2015, 10:30 AM   Willow Ora, PTA, CLT Acute Rehab Services [phone] 12/25/2015, 10:31 AM

## 2015-12-26 NOTE — Discharge Summary (Signed)
Physician Discharge Summary  Patient ID: Alyssa [name] MRN: [id number] DOB/AGE: 64-13-1952 64 y.o.  Admit date: 12/22/2015 Discharge date: 12/26/2015  Admission Diagnoses:Osteoarthritis and a  Discharge Diagnoses:  Active Problems:   Status post total right knee replacement   Discharged Condition: stable  Hospital Course: Patient's hospital course was essentially unremarkable. She underwent total knee arthroplasty was discharged to home.  Consults: None  Significant Diagnostic Studies: labs: Routine labs  Treatments: surgery: See operative note  Discharge Exam: Blood pressure 114/47, pulse 69, temperature 98.7 F (37.1 C), temperature source Oral, resp. rate 19, height 5' 2.5" (1.588 m), weight 107.502 kg (237 lb), SpO2 90 %. Incision/Wound: Dressing clean and dry  Disposition: 01-Home or Self Care  Discharge Instructions    Call MD / Call 911    Complete by:  As directed   If you experience chest pain or shortness of breath, CALL 911 and be transported to the hospital emergency room.  If you develope a fever above 101 F, pus (white drainage) or increased drainage or redness at the wound, or calf pain, call your surgeon's office.     Constipation Prevention    Complete by:  As directed   Drink plenty of fluids.  Prune juice may be helpful.  You may use a stool softener, such as Colace (over the counter) 100 mg twice a day.  Use MiraLax (over the counter) for constipation as needed.     Diet - low sodium heart healthy    Complete by:  As directed      Increase activity slowly as tolerated    Complete by:  As directed             Medication List    STOP taking these medications        traMADol 50 MG tablet  Commonly known as:  ULTRAM      TAKE these medications        ALPRAZolam 0.5 MG tablet  Commonly known as:  XANAX  Take 0.5 mg by mouth at bedtime as needed for anxiety or sleep.     aspirin 325 MG EC tablet  Take 1 tablet (325 mg total) by mouth daily with  breakfast.     cetirizine 10 MG tablet  Commonly known as:  ZYRTEC  Take 10 mg by mouth daily.     Co Q 10 100 MG Caps  Take 200 mg by mouth daily.     divalproex 500 MG DR tablet  Commonly known as:  DEPAKOTE  Take 500 mg by mouth daily.     docusate sodium 100 MG capsule  Commonly known as:  COLACE  Take 1 capsule (100 mg total) by mouth 2 (two) times daily.     exemestane 25 MG tablet  Commonly known as:  AROMASIN  Take 1 tablet (25 mg total) by mouth daily after breakfast.     hydrochlorothiazide 25 MG tablet  Commonly known as:  HYDRODIURIL  Take 25 mg by mouth at bedtime.     HYDROcodone-acetaminophen 7.5-325 MG tablet  Commonly known as:  NORCO  Take 1-2 tablets by mouth every 6 (six) hours as needed for moderate pain.     linagliptin 5 MG Tabs tablet  Commonly known as:  TRADJENTA  Take 5 mg by mouth daily.     lisinopril 5 MG tablet  Commonly known as:  PRINIVIL,ZESTRIL  Take 5 mg by mouth daily.     methocarbamol 500 MG tablet  Commonly known as:  ROBAXIN  Take 1 tablet (500 mg total) by mouth every 6 (six) hours as needed for muscle spasms.     NATURE-THROID 97.5 MG Tabs  Generic drug:  Thyroid  Take 48.75-97.5 mg by mouth 2 (two) times daily. 97.11m in the morning, 48.745min the evening     rosuvastatin 5 MG tablet  Commonly known as:  CRESTOR  Take 5 mg by mouth daily.           Follow-up Information    Follow up with [facility]  Why:  Someone from [facility] contact you to arrange start date and time for Physical therapy.   Contact information:   [id number] [phone]     Schedule an appointment as soon as possible for a visit with YAMarybelle KillingsMD.   Specialty:  Orthopedic Surgery   Why:  needs return office visit 2 weeks postop   Contact information:   [phone]       Signed: DUNewt Minion/10/2015, 8:08 AM

## 2016-01-10 ENCOUNTER — Ambulatory Visit: Payer: BC Managed Care – PPO | Admitting: Physical Therapy

## 2016-01-12 ENCOUNTER — Ambulatory Visit: Payer: BC Managed Care – PPO | Attending: Orthopaedic Surgery

## 2016-01-12 ENCOUNTER — Encounter: Payer: Self-pay | Admitting: Physical Therapy

## 2016-01-12 DIAGNOSIS — R600 Localized edema: Secondary | ICD-10-CM | POA: Diagnosis present

## 2016-01-12 DIAGNOSIS — M6281 Muscle weakness (generalized): Secondary | ICD-10-CM | POA: Insufficient documentation

## 2016-01-12 DIAGNOSIS — M25561 Pain in right knee: Secondary | ICD-10-CM | POA: Insufficient documentation

## 2016-01-12 DIAGNOSIS — M25661 Stiffness of right knee, not elsewhere classified: Secondary | ICD-10-CM | POA: Insufficient documentation

## 2016-01-12 DIAGNOSIS — R2689 Other abnormalities of gait and mobility: Secondary | ICD-10-CM | POA: Diagnosis present

## 2016-01-12 NOTE — Therapy (Signed)
[facility] [address] [place], [place], [address] Phone: [phone]   Fax:  [phone]  Physical Therapy Evaluation  Patient Details  Name: Alyssa [name] MRN: [id number] Date of Birth: 11-23-1950 Referring Provider: Rodell Perna, MD  Encounter Date: 01/12/2016      PT End of Session - 01/12/16 1006    Visit Number 1   Date for PT Re-Evaluation 03/08/16   PT Start Time 0931   PT Stop Time 1025   PT Time Calculation (min) 54 min   Activity Tolerance Patient tolerated treatment well   Behavior During Therapy [facility] for tasks assessed/performed      Past Medical History  Diagnosis Date  . Hyperlipidemia   . Thyroid disease   . Abdominal pain   . Ventral hernia   . Hypertension     PCP DR Nolon Rod  . Recurrent upper respiratory infection (URI)     FLU 08/23/11-08/29/11 then URI 08/29/11- 09/08/11- S/P zpack and steroids-  improved      no cough or congestion  . Hypothyroidism   . Hernia, incisional periumbilical, incarcerated 1/96/2229  . 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 Pulmonary embolism ([place]) 2013  . Hot flashes   . Peripheral vascular disease ([place])     pulmonary embolis-still have some  . Cancer ([place])     breast  . Breast cancer ([place]) 06/2013    left upper inner  . Hx of radiation therapy 09/09/13- 10/13/13    left breast 5000 cGy in 25 sessions, no boost  . Post concussion syndrome     4 yrs ago- sees [facility]- Dr Leonie Man every 6 months-   has sleep study 2011 following accident but was neg per patient-  short term memory issues, dates and times  . Family history of adverse reaction to anesthesia     Patients mother had to be resusciated after having anesthesia  . Diabetes mellitus without complication ([place])     Takes Tradjenta  . GERD (gastroesophageal reflux disease)   . HERDEYCX(448.1)     Takes Depakote    Past Surgical History  Procedure Laterality Date  .  Appendectomy  2002    lap appy.  Dr. Hassell Done  . Carpal tunnel release  10/2009-12/2010    left - right  . Tarsal tunnel release Bilateral   . Cesarean section  X 2  . Ventral hernia repair  09/29/2011    Procedure: LAPAROSCOPIC VENTRAL HERNIA;  Surgeon: Adin Hector, MD;  [facility]: [facility];  Service: General;  Laterality: N/A;  Laparoscopic Ventral Wall Hernia Repair with Mesh  . Breast lumpectomy with needle localization and axillary sentinel lymph node bx Left 08/04/2013    Procedure: BREAST LUMPECTOMY WITH NEEDLE LOCALIZATION AND AXILLARY SENTINEL LYMPH NODE Biopsy x 3;  Surgeon: Rolm Bookbinder, MD;  Location: [place];  Service: General;  Laterality: Left;  . Colonoscopy    . Hernia repair  09/15/11    Ventral w/mesh  . Thyroidectomy, partial    . Knee arthroplasty Right 12/22/2015    Procedure: COMPUTER ASSISTED TOTAL KNEE ARTHROPLASTY;  Surgeon: Marybelle Killings, MD;  Location: [place];  Service: Orthopedics;  Laterality: Right;  . Joint replacement    . Breast surgery    . Laparoscopic cholecystectomy  2006    Dr. Bubba Camp    There were no vitals filed for this visit.       Subjective Assessment - 01/12/16 0938    Subjective  Pt presents to PT s/p Rt TKA 12/22/15 due to OA.  Pt had home health PT and has exercises.  Pt with limited Rt knee AROM and MD would like PT to address PT as he is considering maniuplation surgery.     Pertinent History history of breast cancer   Limitations Standing;Walking   How long can you walk comfortably? 20 minutes or less   Patient Stated Goals improve flexion, wean from walker, improve strength, reduce pain   Currently in Pain? Yes   Pain Score 8   6-8/10 average   Pain Location Knee   Pain Orientation Right   Pain Descriptors / Indicators Aching;Tightness   Pain Type Surgical pain   Pain Onset 1 to 4 weeks ago   Pain Frequency Constant   Aggravating Factors  bending knee, standing and walking   Pain Relieving Factors rest, ice, elevation, pain  medication            [facility] PT Assessment - 01/12/16 0001    Assessment   Medical Diagnosis TKA Rt with poor flexion   Referring Provider Rodell Perna, MD   Onset Date/Surgical Date 12/22/15   Next MD Visit 01/28/16   Prior Therapy home health PT until 01/07/16   Precautions   Precautions Other (comment)  history of breast cancer   Restrictions   Weight Bearing Restrictions No   Balance Screen   Has the patient fallen in the past 6 months No   Has the patient had a decrease in activity level because of a fear of falling?  No   Is the patient reluctant to leave their home because of a fear of falling?  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   Cognition   Overall Cognitive Status Within Functional Limits for tasks assessed   Observation/Other Assessments   Focus on Therapeutic Outcomes (FOTO)  68% limitation   Posture/Postural Control   Posture/Postural Control No significant limitations   ROM / Strength   AROM / PROM / Strength AROM;PROM;Strength   AROM   Overall AROM  Deficits   Overall AROM Comments Lt knee AROM is full   AROM Assessment Site Knee   Right/Left Knee Right   Right Knee Extension 10   Right Knee Flexion 65   PROM   Overall PROM  Deficits   Overall PROM Comments Lt knee PROM is full   PROM Assessment Site Knee   Right/Left Knee Right   Right Knee Extension 8   Right Knee Flexion 68   Strength   Overall Strength Deficits   Overall Strength Comments Lt knee 5/5, Lt hip flexion 4+/5   Strength Assessment Site Knee   Right/Left Knee Right   Right Knee Flexion 4/5  within available AROM   Right Knee Extension 4/5   within available AROm   Palpation   Palpation comment surgical incision with steristrips present.  Tender to palpation over incision, warmth and edema over Rt and Lt joint line.  Mid patella circumference Lt 20 inches, Rt 20.75 inches, incision is 9 inches long   Transfers   Transfers Sit to Stand;Stand to Sit  Sit to Stand With upper extremity assist   Ambulation/Gait   Ambulation/Gait Yes   Ambulation/Gait Assistance 6: Modified independent (Device/Increase time)   Ambulation Distance (Feet) 100 Feet   Assistive device Rolling walker   Gait Pattern Step-to pattern   Ambulation Surface Level                   [facility] Adult PT Treatment/Exercise - 01/12/16 0001    Exercises   Exercises Knee/Hip   Knee/Hip Exercises: Aerobic   Stationary Bike Level 0 for ROM: 10 minutes    Modalities   Modalities Vasopneumatic   Vasopneumatic   Number Minutes Vasopneumatic  15 minutes   Vasopnuematic Location  Knee   Vasopneumatic Pressure Medium   Vasopneumatic Temperature  3 snowflakes                PT Education - 01/12/16 1011    Education provided Yes   Education Details scar mobs   Person(s) Educated Patient   Methods Explanation;Demonstration;Handout   Comprehension Verbalized understanding;Returned demonstration          PT Short Term Goals - 01/12/16 1011    PT SHORT TERM GOAL #1   Title be independent in initial HEP   Time 4   Period Weeks   Status New   PT SHORT TERM GOAL #2   Title demonstrate Rt knee AROM flexion to > or = to 90 degrees to improve sitting and negotiating steps without substitution   Time 4   Period Weeks   Status New   PT SHORT TERM GOAL #3   Title wean from walker to use of cane for all distances   Time 4   Period Weeks   Status New   PT SHORT TERM GOAL #4   Title report < or = to 4/10 Rt knee pain with standing and walking   Time 4   Period Weeks   Status New           PT Long Term Goals - 01/12/16 1013    PT LONG  TERM GOAL #1   Title be independent in advanced HEP   Time 8   Period Weeks   Status New   PT LONG TERM GOAL #2   Title reduce FOTO to < or = to 43% limitation   Time 8   Period Weeks   Status New   PT LONG TERM GOAL #3   Title ascend steps with step-over-step with use of 1 rail   Time 8   Period Weeks   Status New   PT LONG TERM GOAL #4   Title demonstrate Rt knee AROM -5 to 100 degrees to improve mobility without substitution   Time 8   Period Weeks   Status New   PT LONG TERM GOAL #5   Title imrprove strength and endurance to walk for 45 minutes in the community without need for rest   Time 8   Period Weeks   Status New   Additional Long Term Goals   Additional Long Term Goals Yes   PT LONG TERM GOAL #6   Title demonstrate symmetry with ambulation on level surface with use of cane   Time 8   Period Weeks   Status New               Plan - 01/12/16 1007    Clinical Impression Statement Pt presents to PT s/p Rt TKA 12/22/15 due to OA.  Pt had home health PT  until 01/07/16.  Pt demonstrates 10-65 degrees Rt knee AROM, gait with step-to pattern with use of cane/walker, edema, sensitivity over surgical incision with reduced mobility, and weakness.  Pt will benefit from skilled PT for aggressive AROM to help avoid maniuplation surgery, edema management, gait training, strength and scar mobility to help pt to return to prior level of function.   Rehab Potential Good   PT Frequency 3x / week   PT Duration 8 weeks   PT Treatment/Interventions ADLs/Self Care Home Management;Cryotherapy;Electrical Stimulation;Moist Heat;Therapeutic exercise;Therapeutic activities;Functional mobility training;Stair training;Gait training;Ultrasound;Balance training;Neuromuscular re-education;Patient/family education;Manual techniques;Vasopneumatic Device;Taping;Passive range of motion;Scar mobilization   PT Next Visit Plan agressive Rt knee AROM into flexion and extension as tolerated, strength,  scar mobs/soft tissue, gait training, edema management   Consulted and Agree with Plan of Care Patient      Patient will benefit from skilled therapeutic intervention in order to improve the following deficits and impairments:  Abnormal gait, Decreased range of motion, Difficulty walking, Decreased endurance, Decreased activity tolerance, Pain, Increased edema, Decreased strength, Impaired flexibility  Visit Diagnosis: Pain in right knee - Plan: PT plan of care cert/re-cert  Stiffness of right knee, not elsewhere classified - Plan: PT plan of care cert/re-cert  Muscle weakness (generalized) - Plan: PT plan of care cert/re-cert  Localized edema - Plan: PT plan of care cert/re-cert  Other abnormalities of gait and mobility - Plan: PT plan of care cert/re-cert     Problem List Patient Active Problem List   Diagnosis Date Noted  . Status post total right knee replacement 12/22/2015  . Hepatic steatosis 02/09/2015  . Hot flashes 09/09/2014  . Osteopenia 09/09/2014  . Breast cancer of upper-inner quadrant of left female breast ([place]) 07/23/2013  . Memory loss 07/14/2013  . Headache 07/14/2013  . Other malaise and fatigue 07/14/2013  . Acute pulmonary embolism ([place]) 10/07/2011  . Leukocytosis 10/07/2011  . Thyroid mass, left inferior lobe, 3.6cm [id number] 10/07/2011  . Hyponatremia 10/07/2011  . Obesity (BMI 30-39.9) 08/07/2011  . Post concussion syndrome      Sigurd Sos, PT 01/12/2016 10:17 AM  [facility] [address] [place], [place], [address] Phone: [phone]   Fax:  [phone]  Name: NALIAH EDDINGTON MRN: [id number] Date of Birth: 12-07-1950

## 2016-01-12 NOTE — Patient Instructions (Signed)
Scar Massage  Scar massage is done to improve the mobility of scar, decrease scar tissue from building up, reduce adhesions, and prevent Keloids from forming. Start scar massage after scabs have fallen off by themselves and no open areas. The first few weeks after surgery, it is normal for a scar to appear pink or red and slightly raised. Scars can itch or have areas of numbness. Some scars may be sensitive.   Direct Scar massage: after scar is healed, no opening, no scab 1.  Place pads of two fingers together directly on the scar starting at one end of the scar. Move the fingers up and down across the scar holding 5 seconds one direction.  Then go opposite direction hold 5 seconds.  2. Move over to the next section of the scar and repeat.  Work your way along the entire length of the scar.   3. Next make diagonal movements along the scar holding 5 seconds at one direction. 4. Next movement is side to side. 5. Do not rub fingers over the scar.  Instead keep firm pressure and move scar over the tissue it is on top    [facility] [address] [place], [place] [address] Phone # [phone] Fax [phone]

## 2016-01-14 ENCOUNTER — Ambulatory Visit: Payer: BC Managed Care – PPO | Admitting: Physical Therapy

## 2016-01-14 DIAGNOSIS — M25561 Pain in right knee: Secondary | ICD-10-CM | POA: Diagnosis not present

## 2016-01-14 DIAGNOSIS — R600 Localized edema: Secondary | ICD-10-CM

## 2016-01-14 DIAGNOSIS — M25661 Stiffness of right knee, not elsewhere classified: Secondary | ICD-10-CM

## 2016-01-14 DIAGNOSIS — M6281 Muscle weakness (generalized): Secondary | ICD-10-CM

## 2016-01-14 DIAGNOSIS — R2689 Other abnormalities of gait and mobility: Secondary | ICD-10-CM

## 2016-01-14 NOTE — Therapy (Signed)
[facility] [address] [place], [place], [address] Phone: [phone]   Fax:  [phone]  Physical Therapy Treatment  Patient Details  Name: Alyssa [name] MRN: [id number] Date of Birth: Sep 15, 1950 Referring Provider: Rodell Perna, MD  Encounter Date: 01/14/2016      PT End of Session - 01/14/16 0951    Visit Number 2   Date for PT Re-Evaluation 03/08/16   PT Start Time 0935   PT Stop Time 1856   PT Time Calculation (min) 60 min   Activity Tolerance Patient tolerated treatment well      Past Medical History  Diagnosis Date  . Hyperlipidemia   . Thyroid disease   . Abdominal pain   . Ventral hernia   . Hypertension     PCP DR Nolon Rod  . Recurrent upper respiratory infection (URI)     FLU 08/23/11-08/29/11 then URI 08/29/11- 09/08/11- S/P zpack and steroids-  improved      no cough or congestion  . Hypothyroidism   . Hernia, incisional periumbilical, incarcerated 10/04/9700  . Thyroid mass, left inferior lobe, 3.6cm [id number] 10/07/2011  . Pulmonary embolism ([place]) 2013  . Hot flashes   . Peripheral vascular disease ([place])     pulmonary embolis-still have some  . Cancer ([place])     breast  . Breast cancer ([place]) 06/2013    left upper inner  . Hx of radiation therapy 09/09/13- 10/13/13    left breast 5000 cGy in 25 sessions, no boost  . Post concussion syndrome     4 yrs ago- sees [facility]- Dr Leonie Man every 6 months-   has sleep study 2011 following accident but was neg per patient-  short term memory issues, dates and times  . Family history of adverse reaction to anesthesia     Patients mother had to be resusciated after having anesthesia  . Diabetes mellitus without complication ([place])     Takes Tradjenta  . GERD (gastroesophageal reflux disease)   . FOYDXAJO(878.6)     Takes Depakote    Past Surgical History  Procedure Laterality Date  . Appendectomy  2002    lap appy.  Dr. Hassell Done  . Carpal tunnel  release  10/2009-12/2010    left - right  . Tarsal tunnel release Bilateral   . Cesarean section  X 2  . Ventral hernia repair  09/29/2011    Procedure: LAPAROSCOPIC VENTRAL HERNIA;  Surgeon: Adin Hector, MD;  [facility];  Service: General;  Laterality: N/A;  Laparoscopic Ventral Wall Hernia Repair with Mesh  . Breast lumpectomy with needle localization and axillary sentinel lymph node bx Left 08/04/2013    Procedure: BREAST LUMPECTOMY WITH NEEDLE LOCALIZATION AND AXILLARY SENTINEL LYMPH NODE Biopsy x 3;  Surgeon: Rolm Bookbinder, MD;  Location: [facility];  Service: General;  Laterality: Left;  . Colonoscopy    . Hernia repair  09/15/11    Ventral w/mesh  . Thyroidectomy, partial    . Knee arthroplasty Right 12/22/2015    Procedure: COMPUTER ASSISTED TOTAL KNEE ARTHROPLASTY;  Surgeon: Marybelle Killings, MD;  Location: [facility];  Service: Orthopedics;  Laterality: Right;  . Joint replacement    . Breast surgery    . Laparoscopic cholecystectomy  2006    Dr. Bubba Camp    There were no vitals filed for this visit.      Subjective Assessment - 01/14/16 0939    Subjective I was able to roll over in bed last night  for the first.  Presents with SPC.  I wish Dr. Lorin Mercy would just do the manipulation and get it over with.  States she is able to go longer without pain meds.     Currently in Pain? Yes   Pain Score 5    Pain Location Knee   Pain Orientation Right   Pain Type Surgical pain   Pain Onset 1 to 4 weeks ago   Pain Frequency Constant                         [facility] Adult PT Treatment/Exercise - 01/14/16 0001    Knee/Hip Exercises: Stretches   Other Knee/Hip Stretches stair step flexion and extension on step 2 min each   Knee/Hip Exercises: Aerobic   Nustep Nu-Step 10 min seat 8 L1   Knee/Hip Exercises: Standing   Other Standing Knee Exercises retro step 20x   Knee/Hip Exercises: Seated   Long Arc Quad Right;1 set;15 reps   Long Arc Quad Weight 2 lbs.   Hamstring Curl  Strengthening;Right;1 set;15 reps   Hamstring Limitations red pain   Knee/Hip Exercises: Supine   Other Supine Knee/Hip Exercises red ball roll for flexion 2x10   Other Supine Knee/Hip Exercises HS sets on red ball 10x   Vasopneumatic   Number Minutes Vasopneumatic  15 minutes   Vasopnuematic Location  Knee   Vasopneumatic Pressure Medium   Vasopneumatic Temperature  3 snowflakes   Manual Therapy   Manual Therapy Joint mobilization;Soft tissue mobilization;Muscle Energy Technique   Joint Mobilization seated knee overpressure grade 3 10x and supine grade 3 10x   Soft tissue mobilization quads   Muscle Energy Technique HS contract/relax                   PT Short Term Goals - 01/14/16 1224    PT SHORT TERM GOAL #1   Title be independent in initial HEP   Time 4   Period Weeks   Status On-going   PT SHORT TERM GOAL #2   Title demonstrate Rt knee AROM flexion to > or = to 90 degrees to improve sitting and negotiating steps without substitution   Time 4   Period Weeks   Status On-going   PT SHORT TERM GOAL #3   Title wean from walker to use of cane for all distances   Time 4   Period Weeks   Status On-going   PT SHORT TERM GOAL #4   Title report < or = to 4/10 Rt knee pain with standing and walking   Time 4   Period Weeks   Status On-going           PT Long Term Goals - 01/14/16 1225    PT LONG TERM GOAL #1   Title be independent in advanced HEP   Time 8   Period Weeks   Status On-going   PT LONG TERM GOAL #2   Title reduce FOTO to < or = to 43% limitation   Time 8   Period Weeks   Status On-going   PT LONG TERM GOAL #3   Title ascend steps with step-over-step with use of 1 rail   Time 8   Period Weeks   Status On-going   PT LONG TERM GOAL #4   Title demonstrate Rt knee AROM -5 to 100 degrees to improve mobility without substitution   Time 8   Period Weeks   Status On-going   PT LONG  TERM GOAL #5   Title imrprove strength and endurance to walk  for 45 minutes in the community without need for rest   Time 8   Period Weeks   Status On-going   PT LONG TERM GOAL #6   Title demonstrate symmetry with ambulation on level surface with use of cane   Time 8   Period Weeks   Status On-going               Plan - 01/14/16 3646    Clinical Impression Statement Knee stiffness continues flexion more limited than extension.  Decreased quad motor control, positive quad lag.  Sensitive myofascially.  By end of treatment session the patient is able to dangle right LE over the side of the bed (initially held in rigid 60 angle).  The patient intially is fearful of movement but  states,  "that's not too bad.  I can do this."  Encouraged ROM 3-5 min every 2 hours of both flexion and extension.  Therapist closely monitoring response with all treatment interventions.     PT Next Visit Plan  Rt knee AROM into flexion and extension as tolerated, strength, scar mobs/soft tissue, gait training, edema management, quad muscle activation ex      Patient will benefit from skilled therapeutic intervention in order to improve the following deficits and impairments:     Visit Diagnosis: Pain in right knee  Stiffness of right knee, not elsewhere classified  Muscle weakness (generalized)  Localized edema  Other abnormalities of gait and mobility     Problem List Patient Active Problem List   Diagnosis Date Noted  . Status post total right knee replacement 12/22/2015  . Hepatic steatosis 02/09/2015  . Hot flashes 09/09/2014  . Osteopenia 09/09/2014  . Breast cancer of upper-inner quadrant of left female breast ([place]) 07/23/2013  . Memory loss 07/14/2013  . Headache 07/14/2013  . Other malaise and fatigue 07/14/2013  . Acute pulmonary embolism ([place]) 10/07/2011  . Leukocytosis 10/07/2011  . Thyroid mass, left inferior lobe, 3.6cm [id number] 10/07/2011  . Hyponatremia 10/07/2011  . Obesity (BMI 30-39.9) 08/07/2011  . Post concussion syndrome     Ruben Im, PT 01/14/2016 12:27 PM Phone: [phone] Fax: [phone]  Alvera Singh 01/14/2016, 12:26 PM  [facility] [address], [place], [place], [address] Phone: [phone]   Fax:  [phone]  Name: JOLYNE LAYE MRN: [id number] Date of Birth: 1951/06/26

## 2016-01-17 ENCOUNTER — Encounter: Payer: Self-pay | Admitting: Physical Therapy

## 2016-01-17 ENCOUNTER — Ambulatory Visit: Payer: BC Managed Care – PPO | Admitting: Physical Therapy

## 2016-01-17 DIAGNOSIS — M6281 Muscle weakness (generalized): Secondary | ICD-10-CM

## 2016-01-17 DIAGNOSIS — M25561 Pain in right knee: Secondary | ICD-10-CM | POA: Diagnosis not present

## 2016-01-17 DIAGNOSIS — R2689 Other abnormalities of gait and mobility: Secondary | ICD-10-CM

## 2016-01-17 DIAGNOSIS — M25661 Stiffness of right knee, not elsewhere classified: Secondary | ICD-10-CM

## 2016-01-17 DIAGNOSIS — R600 Localized edema: Secondary | ICD-10-CM

## 2016-01-17 NOTE — Therapy (Signed)
[facility] [address] [place], [place], [address] Phone: [phone]   Fax:  [phone]  Physical Therapy Treatment  Patient Details  Name: Alyssa [name] MRN: [id number] Date of Birth: 1951/07/18 Referring Provider: Rodell Perna, MD  Encounter Date: 01/17/2016      PT End of Session - 01/17/16 0941    Visit Number 3   Date for PT Re-Evaluation 03/08/16   PT Start Time 0935   PT Stop Time 1030   PT Time Calculation (min) 55 min   Activity Tolerance Patient tolerated treatment well   Behavior During Therapy [facility] for tasks assessed/performed      Past Medical History  Diagnosis Date  . Hyperlipidemia   . Thyroid disease   . Abdominal pain   . Ventral hernia   . Hypertension     PCP DR Nolon Rod  . Recurrent upper respiratory infection (URI)     FLU 08/23/11-08/29/11 then URI 08/29/11- 09/08/11- S/P zpack and steroids-  improved      no cough or congestion  . Hypothyroidism   . Hernia, incisional periumbilical, incarcerated 9/82/6415  . Thyroid mass, left inferior lobe, 3.6cm [id number] 10/07/2011  . Pulmonary embolism ([place]) 2013  . Hot flashes   . Peripheral vascular disease ([name])     pulmonary embolis-still have some  . Cancer ([place])     breast  . Breast cancer ([place]) 06/2013    left upper inner  . Hx of radiation therapy 09/09/13- 10/13/13    left breast 5000 cGy in 25 sessions, no boost  . Post concussion syndrome     4 yrs ago- sees [facility]- Dr Leonie Man every 6 months-   has sleep study 2011 following accident but was neg per patient-  short term memory issues, dates and times  . Family history of adverse reaction to anesthesia     Patients mother had to be resusciated after having anesthesia  . Diabetes mellitus without complication ([place])     Takes Tradjenta  . GERD (gastroesophageal reflux disease)   . WKGSUPJS(315.9)     Takes Depakote    Past Surgical History  Procedure Laterality Date  .  Appendectomy  2002    lap appy.  Dr. Hassell Done  . Carpal tunnel release  10/2009-12/2010    left - right  . Tarsal tunnel release Bilateral   . Cesarean section  X 2  . Ventral hernia repair  09/29/2011    Procedure: LAPAROSCOPIC VENTRAL HERNIA;  Surgeon: Adin Hector, MD;  [facility];  Service: General;  Laterality: N/A;  Laparoscopic Ventral Wall Hernia Repair with Mesh  . Breast lumpectomy with needle localization and axillary sentinel lymph node bx Left 08/04/2013    Procedure: BREAST LUMPECTOMY WITH NEEDLE LOCALIZATION AND AXILLARY SENTINEL LYMPH NODE Biopsy x 3;  Surgeon: Rolm Bookbinder, MD;  Location: [place];  Service: General;  Laterality: Left;  . Colonoscopy    . Hernia repair  09/15/11    Ventral w/mesh  . Thyroidectomy, partial    . Knee arthroplasty Right 12/22/2015    Procedure: COMPUTER ASSISTED TOTAL KNEE ARTHROPLASTY;  Surgeon: Marybelle Killings, MD;  Location: [place];  Service: Orthopedics;  Laterality: Right;  . Joint replacement    . Breast surgery    . Laparoscopic cholecystectomy  2006    Dr. Bubba Camp    There were no vitals filed for this visit.      Subjective Assessment - 01/17/16 0942    Subjective Yesterday  was a bad day, this AM was not so bad. Trying to do more exercise at home.    Currently in Pain? Yes   Pain Score 5    Pain Location Knee   Pain Orientation Right   Pain Descriptors / Indicators Aching;Tightness   Aggravating Factors  Bending, too much walking or standing   Pain Relieving Factors Rest, meds   Multiple Pain Sites No            [facility] PT Assessment - 01/17/16 0001    AROM   Right Knee Flexion 71  Pt sitting                     [facility] Adult PT Treatment/Exercise - 01/17/16 0001    Knee/Hip Exercises: Stretches   Other Knee/Hip Stretches Stair stretch for flexion using Creep theory    Knee/Hip Exercises: Aerobic   Nustep Nu-Step 15 min seat 8 L1   Knee/Hip Exercises: Seated   Long Arc Quad AROM;Right;20 reps    Knee/Hip Exercises: Supine   Other Supine Knee/Hip Exercises red ball roll for flexion 2x10   Vasopneumatic   Number Minutes Vasopneumatic  15 minutes   Vasopnuematic Location  Knee   Vasopneumatic Pressure High   Vasopneumatic Temperature  3 snowflakes   Manual Therapy   Soft tissue mobilization scar massage, MRF peripatella, quads                  PT Short Term Goals - 01/17/16 1017    PT SHORT TERM GOAL #1   Title be independent in initial HEP   Time 4   Period Weeks   Status Achieved   PT SHORT TERM GOAL #3   Title wean from walker to use of cane for all distances   Time 4   Period Weeks   Status Partially Met  uses it in the AM until she gets "warmed up."   PT SHORT TERM GOAL #4   Title report < or = to 4/10 Rt knee pain with standing and walking   Time 4   Period Weeks   Status On-going  4/10           PT Long Term Goals - 01/14/16 1225    PT LONG TERM GOAL #1   Title be independent in advanced HEP   Time 8   Period Weeks   Status On-going   PT LONG TERM GOAL #2   Title reduce FOTO to < or = to 43% limitation   Time 8   Period Weeks   Status On-going   PT LONG TERM GOAL #3   Title ascend steps with step-over-step with use of 1 rail   Time 8   Period Weeks   Status On-going   PT LONG TERM GOAL #4   Title demonstrate Rt knee AROM -5 to 100 degrees to improve mobility without substitution   Time 8   Period Weeks   Status On-going   PT LONG TERM GOAL #5   Title imrprove strength and endurance to walk for 45 minutes in the community without need for rest   Time 8   Period Weeks   Status On-going   PT LONG TERM GOAL #6   Title demonstrate symmetry with ambulation on level surface with use of cane   Time 8   Period Weeks   Status On-going               Plan - 01/17/16 7048  Clinical Impression Statement Pt goes back to Md on 01/28/16. She measured her flexion at 71 at the end of session in sitting. She continues to be very  sensitive to touch along the scar and cannot bring herself to massage it. Decreased knee flexion when she ambulates.    Rehab Potential Good   PT Frequency 3x / week   PT Duration 8 weeks   PT Treatment/Interventions ADLs/Self Care Home Management;Cryotherapy;Electrical Stimulation;Moist Heat;Therapeutic exercise;Therapeutic activities;Functional mobility training;Stair training;Gait training;Ultrasound;Balance training;Neuromuscular re-education;Patient/family education;Manual techniques;Vasopneumatic Device;Taping;Passive range of motion;Scar mobilization   PT Next Visit Plan RT knee ROM, manual techniques to improve flexion & extension   Consulted and Agree with Plan of Care --      Patient will benefit from skilled therapeutic intervention in order to improve the following deficits and impairments:  Abnormal gait, Decreased range of motion, Difficulty walking, Decreased endurance, Decreased activity tolerance, Pain, Increased edema, Decreased strength, Impaired flexibility  Visit Diagnosis: Pain in right knee  Stiffness of right knee, not elsewhere classified  Muscle weakness (generalized)  Localized edema  Other abnormalities of gait and mobility     Problem List Patient Active Problem List   Diagnosis Date Noted  . Status post total right knee replacement 12/22/2015  . Hepatic steatosis 02/09/2015  . Hot flashes 09/09/2014  . Osteopenia 09/09/2014  . 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 Memory loss 07/14/2013  . Headache 07/14/2013  . Other malaise and fatigue 07/14/2013  . Acute pulmonary embolism ([place]) 10/07/2011  . Leukocytosis 10/07/2011  . Thyroid mass, left inferior lobe, 3.6cm [id number] 10/07/2011  . Hyponatremia 10/07/2011  . Obesity (BMI 30-39.9) 08/07/2011  . Post concussion syndrome     COCHRAN,JENNIFER,PTA 01/17/2016, 10:20 AM  [facility] [address],  [place], [place], [address] Phone: [phone]   Fax:  [phone]  Name: Alyssa [name] MRN: [id number] Date of Birth: 11/16/1950

## 2016-01-19 ENCOUNTER — Encounter: Payer: Self-pay | Admitting: Physical Therapy

## 2016-01-19 ENCOUNTER — Ambulatory Visit: Payer: BC Managed Care – PPO | Admitting: Physical Therapy

## 2016-01-19 DIAGNOSIS — M25661 Stiffness of right knee, not elsewhere classified: Secondary | ICD-10-CM

## 2016-01-19 DIAGNOSIS — M25561 Pain in right knee: Secondary | ICD-10-CM

## 2016-01-19 DIAGNOSIS — M6281 Muscle weakness (generalized): Secondary | ICD-10-CM

## 2016-01-19 DIAGNOSIS — R2689 Other abnormalities of gait and mobility: Secondary | ICD-10-CM

## 2016-01-19 DIAGNOSIS — R600 Localized edema: Secondary | ICD-10-CM

## 2016-01-19 NOTE — Therapy (Signed)
[facility] [address], [place], [place], [address] Phone: [phone]   Fax:  [phone]  Physical Therapy Treatment  Patient Details  Name: Alyssa [name] MRN: [id number] Date of Birth: 22-Aug-1950 Referring Provider: Rodell Perna, MD  Encounter Date: 01/19/2016      PT End of Session - 01/19/16 0931    Visit Number 4   Date for PT Re-Evaluation 03/08/16   PT Start Time 0922   PT Stop Time 1025   PT Time Calculation (min) 63 min   Activity Tolerance Patient tolerated treatment well   Behavior During Therapy [facility] for tasks assessed/performed      Past Medical History  Diagnosis Date  . Hyperlipidemia   . Thyroid disease   . Abdominal pain   . Ventral hernia   . Hypertension     PCP DR Nolon Rod  . Recurrent upper respiratory infection (URI)     FLU 08/23/11-08/29/11 then URI 08/29/11- 09/08/11- S/P zpack and steroids-  improved      no cough or congestion  . Hypothyroidism   . Hernia, incisional periumbilical, incarcerated 2/87/6811  . Thyroid mass, left inferior lobe, 3.6cm [id number] 10/07/2011  . Pulmonary embolism ([place]) 2013  . Hot flashes   . Peripheral vascular disease ([place])     pulmonary embolis-still have some  . Cancer ([place])     breast  . Breast cancer ([place]) 06/2013    left upper inner  . Hx of radiation therapy 09/09/13- 10/13/13    left breast 5000 cGy in 25 sessions, no boost  . Post concussion syndrome     4 yrs ago- sees [facility]- Dr Leonie Man every 6 months-   has sleep study 2011 following accident but was neg per patient-  short term memory issues, dates and times  . Family history of adverse reaction to anesthesia     Patients mother had to be resusciated after having anesthesia  . Diabetes mellitus without complication ([place])     Takes Tradjenta  . GERD (gastroesophageal reflux disease)   . TDHRCBUL(845.3)     Takes Depakote    Past Surgical History  Procedure Laterality Date  .  Appendectomy  2002    lap appy.  Dr. Hassell Done  . Carpal tunnel release  10/2009-12/2010    left - right  . Tarsal tunnel release Bilateral   . Cesarean section  X 2  . Ventral hernia repair  09/29/2011    Procedure: LAPAROSCOPIC VENTRAL HERNIA;  Surgeon: Adin Hector, MD;  [facility];  Service: General;  Laterality: N/A;  Laparoscopic Ventral Wall Hernia Repair with Mesh  . Breast lumpectomy with needle localization and axillary sentinel lymph node bx Left 08/04/2013    Procedure: BREAST LUMPECTOMY WITH NEEDLE LOCALIZATION AND AXILLARY SENTINEL LYMPH NODE Biopsy x 3;  Surgeon: Rolm Bookbinder, MD;  Location: [place];  Service: General;  Laterality: Left;  . Colonoscopy    . Hernia repair  09/15/11    Ventral w/mesh  . Thyroidectomy, partial    . Knee arthroplasty Right 12/22/2015    Procedure: COMPUTER ASSISTED TOTAL KNEE ARTHROPLASTY;  Surgeon: Marybelle Killings, MD;  Location: [place];  Service: Orthopedics;  Laterality: Right;  . Joint replacement    . Breast surgery    . Laparoscopic cholecystectomy  2006    Dr. Bubba Camp    There were no vitals filed for this visit.      Subjective Assessment - 01/19/16 0932    Subjective Pt  received her CPM yesterday for home use. She is using 3x day. Pain is low today. Pt is now using cane 100%.    Currently in Pain? Yes   Pain Location Knee   Pain Orientation Right   Pain Descriptors / Indicators Dull   Multiple Pain Sites No            [facility] PT Assessment - 01/19/16 0001    AROM   Right Knee Flexion 74  Sitting                     [facility] Adult PT Treatment/Exercise - 01/19/16 0001    Knee/Hip Exercises: Stretches   Other Knee/Hip Stretches Stair stretch for flexion using Creep theory    Knee/Hip Exercises: Aerobic   Nustep  L1 x 10 mn   Knee/Hip Exercises: Seated   Heel Slides --  Towel on floor 2 x10    Knee/Hip Exercises: Supine   Other Supine Knee/Hip Exercises red ball roll for flexion 2x10   Vasopneumatic    Number Minutes Vasopneumatic  15 minutes   Vasopnuematic Location  Knee   Vasopneumatic Pressure High   Vasopneumatic Temperature  3 snowflakes   Manual Therapy   Soft tissue mobilization scar massage, MRF peripatella, quads                  PT Short Term Goals - 01/19/16 0936    PT SHORT TERM GOAL #3   Title wean from walker to use of cane for all distances   Time 4   Period Weeks   Status Achieved   PT SHORT TERM GOAL #4   Title report < or = to 4/10 Rt knee pain with standing and walking   Time 4   Period Weeks   Status Achieved           PT Long Term Goals - 01/14/16 1225    PT LONG TERM GOAL #1   Title be independent in advanced HEP   Time 8   Period Weeks   Status On-going   PT LONG TERM GOAL #2   Title reduce FOTO to < or = to 43% limitation   Time 8   Period Weeks   Status On-going   PT LONG TERM GOAL #3   Title ascend steps with step-over-step with use of 1 rail   Time 8   Period Weeks   Status On-going   PT LONG TERM GOAL #4   Title demonstrate Rt knee AROM -5 to 100 degrees to improve mobility without substitution   Time 8   Period Weeks   Status On-going   PT LONG TERM GOAL #5   Title imrprove strength and endurance to walk for 45 minutes in the community without need for rest   Time 8   Period Weeks   Status On-going   PT LONG TERM GOAL #6   Title demonstrate symmetry with ambulation on level surface with use of cane   Time 8   Period Weeks   Status On-going               Plan - 01/19/16 0321    Clinical Impression Statement Flexion slowly increasing. Pt now has CPM machine at home and is using 3 x day. She is trying to massage her scar at home more, still does not like to touch it.  She has now weaned from walker 100% of the time and for all distances. She also met STG of  pain being no > 4/10 for staniding and walking activites.    Rehab Potential Good   PT Frequency 3x / week   PT Duration 8 weeks   PT  Treatment/Interventions ADLs/Self Care Home Management;Cryotherapy;Electrical Stimulation;Moist Heat;Therapeutic exercise;Therapeutic activities;Functional mobility training;Stair training;Gait training;Ultrasound;Balance training;Neuromuscular re-education;Patient/family education;Manual techniques;Vasopneumatic Device;Taping;Passive range of motion;Scar mobilization   PT Next Visit Plan RT knee ROM, manual techniques to improve flexion & extension   Consulted and Agree with Plan of Care Patient      Patient will benefit from skilled therapeutic intervention in order to improve the following deficits and impairments:  Abnormal gait, Decreased range of motion, Difficulty walking, Decreased endurance, Decreased activity tolerance, Pain, Increased edema, Decreased strength, Impaired flexibility  Visit Diagnosis: Pain in right knee  Stiffness of right knee, not elsewhere classified  Muscle weakness (generalized)  Localized edema  Other abnormalities of gait and mobility     Problem List Patient Active Problem List   Diagnosis Date Noted  . Status post total right knee replacement 12/22/2015  . Hepatic steatosis 02/09/2015  . Hot flashes 09/09/2014  . Osteopenia 09/09/2014  . Breast cancer of upper-inner quadrant of left female breast ([place]) 07/23/2013  . Memory loss 07/14/2013  . Headache 07/14/2013  . Other malaise and fatigue 07/14/2013  . Acute pulmonary embolism ([place]) 10/07/2011  . Leukocytosis 10/07/2011  . Thyroid mass, left inferior lobe, 3.6cm [id number] 10/07/2011  . Hyponatremia 10/07/2011  . Obesity (BMI 30-39.9) 08/07/2011  . Post concussion syndrome     Esli Clements, PTA 01/19/2016, 10:10 AM  [facility] [address], [place], [place], [address] Phone: [phone]   Fax:  [phone]  Name: Alyssa [name] MRN: [id number] Date of Birth: Nov 07, 1950

## 2016-01-21 ENCOUNTER — Ambulatory Visit: Payer: BC Managed Care – PPO | Admitting: Physical Therapy

## 2016-01-21 DIAGNOSIS — M6281 Muscle weakness (generalized): Secondary | ICD-10-CM

## 2016-01-21 DIAGNOSIS — R2689 Other abnormalities of gait and mobility: Secondary | ICD-10-CM

## 2016-01-21 DIAGNOSIS — R600 Localized edema: Secondary | ICD-10-CM

## 2016-01-21 DIAGNOSIS — M25561 Pain in right knee: Secondary | ICD-10-CM

## 2016-01-21 DIAGNOSIS — M25661 Stiffness of right knee, not elsewhere classified: Secondary | ICD-10-CM

## 2016-01-21 NOTE — Therapy (Signed)
[facility] [address], [place], [place], [address] Phone: [phone]   Fax:  [phone]  Physical Therapy Treatment  Patient Details  Name: Alyssa [name] MRN: [id number] Date of Birth: 03-19-1951 Referring Provider: Rodell Perna, MD  Encounter Date: 01/21/2016      PT End of Session - 01/21/16 1246    Visit Number 5   Date for PT Re-Evaluation 03/08/16   PT Start Time 4401   PT Stop Time 1116   PT Time Calculation (min) 53 min      Past Medical History  Diagnosis Date  . Hyperlipidemia   . Thyroid disease   . Abdominal pain   . Ventral hernia   . Hypertension     PCP DR Nolon Rod  . Recurrent upper respiratory infection (URI)     FLU 08/23/11-08/29/11 then URI 08/29/11- 09/08/11- S/P zpack and steroids-  improved      no cough or congestion  . Hypothyroidism   . Hernia, incisional periumbilical, incarcerated 0/27/2536  . Thyroid mass, left inferior lobe, 3.6cm [id number] 10/07/2011  . Pulmonary embolism ([place]) 2013  . Hot flashes   . Peripheral vascular disease ([place])     pulmonary embolis-still have some  . Cancer ([place])     breast  . Breast cancer ([place]) 06/2013    left upper inner  . Hx of radiation therapy 09/09/13- 10/13/13    left breast 5000 cGy in 25 sessions, no boost  . Post concussion syndrome     4 yrs ago- sees [facility]- Dr Leonie Man every 6 months-   has sleep study 2011 following accident but was neg per patient-  short term memory issues, dates and times  . Family history of adverse reaction to anesthesia     Patients mother had to be resusciated after having anesthesia  . Diabetes mellitus without complication ([place])     Takes Tradjenta  . GERD (gastroesophageal reflux disease)   . QQVZDGLO(756.4)     Takes Depakote    Past Surgical History  Procedure Laterality Date  . Appendectomy  2002    lap appy.  Dr. Hassell Done  . Carpal tunnel release  10/2009-12/2010    left - right  . Tarsal tunnel  release Bilateral   . Cesarean section  X 2  . Ventral hernia repair  09/29/2011    Procedure: LAPAROSCOPIC VENTRAL HERNIA;  Surgeon: Adin Hector, MD;  [facility];  Service: General;  Laterality: N/A;  Laparoscopic Ventral Wall Hernia Repair with Mesh  . Breast lumpectomy with needle localization and axillary sentinel lymph node bx Left 08/04/2013    Procedure: BREAST LUMPECTOMY WITH NEEDLE LOCALIZATION AND AXILLARY SENTINEL LYMPH NODE Biopsy x 3;  Surgeon: Rolm Bookbinder, MD;  Location: [place];  Service: General;  Laterality: Left;  . Colonoscopy    . Hernia repair  09/15/11    Ventral w/mesh  . Thyroidectomy, partial    . Knee arthroplasty Right 12/22/2015    Procedure: COMPUTER ASSISTED TOTAL KNEE ARTHROPLASTY;  Surgeon: Marybelle Killings, MD;  Location: [place];  Service: Orthopedics;  Laterality: Right;  . Joint replacement    . Breast surgery    . Laparoscopic cholecystectomy  2006    Dr. Bubba Camp    There were no vitals filed for this visit.      Subjective Assessment - 01/21/16 1029    Subjective Reports she has been using her CPM at home to 73 degrees.  I've been stretching at home  often.  I almost fell the other day on the single point cane so now I have one with little feet on the end.     Currently in Pain? Yes   Pain Score 4    Pain Location Knee   Pain Orientation Right   Pain Type Surgical pain   Pain Onset 1 to 4 weeks ago            [facility] PT Assessment - 01/21/16 0001    AROM   Right Knee Flexion 75  supine AA with ball assist                     [facility] Adult PT Treatment/Exercise - 01/21/16 0001    Knee/Hip Exercises: Stretches   Other Knee/Hip Stretches stair step flexion and extension on step 2 min each   Knee/Hip Exercises: Aerobic   Nustep  L1 x 12 mn   Knee/Hip Exercises: Standing   Other Standing Knee Exercises retro step 20x   Knee/Hip Exercises: Seated   Long Arc Quad Right;1 set;15 reps   Long Arc Quad Weight 2 lbs.   Knee/Hip  Exercises: Supine   Other Supine Knee/Hip Exercises red ball roll for flexion 2x10   Other Supine Knee/Hip Exercises HS sets on red ball 10x   Vasopneumatic   Number Minutes Vasopneumatic  15 minutes   Vasopnuematic Location  Knee   Vasopneumatic Pressure High   Vasopneumatic Temperature  3 snowflakes   Manual Therapy   Joint Mobilization seated knee overpressure grade 3 10x and supine grade 3 10x   Soft tissue mobilization quads   Muscle Energy Technique HS contract/relax                   PT Short Term Goals - 01/21/16 1501    PT SHORT TERM GOAL #1   Title be independent in initial HEP   Status Achieved   PT SHORT TERM GOAL #2   Title demonstrate Rt knee AROM flexion to > or = to 90 degrees to improve sitting and negotiating steps without substitution   Time 4   Period Weeks   Status On-going   PT SHORT TERM GOAL #3   Title wean from walker to use of cane for all distances   Status Achieved   PT SHORT TERM GOAL #4   Title report < or = to 4/10 Rt knee pain with standing and walking   Status Achieved           PT Long Term Goals - 01/21/16 1501    PT LONG TERM GOAL #1   Title be independent in advanced HEP   Time 8   Period Weeks   Status On-going   PT LONG TERM GOAL #2   Title reduce FOTO to < or = to 43% limitation   Time 8   Period Weeks   Status On-going   PT LONG TERM GOAL #3   Title ascend steps with step-over-step with use of 1 rail   Time 8   Period Weeks   Status On-going   PT LONG TERM GOAL #4   Title demonstrate Rt knee AROM -5 to 100 degrees to improve mobility without substitution   Time 8   Period Weeks   Status On-going   PT LONG TERM GOAL #5   Title imrprove strength and endurance to walk for 45 minutes in the community without need for rest   Time 8   Period Weeks   Status  On-going   PT LONG TERM GOAL #6   Title demonstrate symmetry with ambulation on level surface with use of cane   Time 8   Period Weeks   Status On-going                Plan - 01/21/16 1455    Clinical Impression Statement Patient continues to have limited and painful knee ROM, flexion more so than extension.  She reports that if she does not have 90 degrees by next Friday then the doctor will do a manipulation.  Discussed increasing her CPM 3-5 degrees a day over the next week.  Decreasing edema however decreased quad activation contributing to quad lag.  Therapist providing verbal and tactile cues for ROM and facilitation of quads.     PT Next Visit Plan RT knee ROM, manual techniques to improve flexion & extension;  vasocompression; quad activation      Patient will benefit from skilled therapeutic intervention in order to improve the following deficits and impairments:     Visit Diagnosis: Pain in right knee  Stiffness of right knee, not elsewhere classified  Muscle weakness (generalized)  Localized edema  Other abnormalities of gait and mobility     Problem List Patient Active Problem List   Diagnosis Date Noted  . Status post total right knee replacement 12/22/2015  . Hepatic steatosis 02/09/2015  . Hot flashes 09/09/2014  . Osteopenia 09/09/2014  . 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 Memory loss 07/14/2013  . Headache 07/14/2013  . Other malaise and fatigue 07/14/2013  . Acute pulmonary embolism ([place]) 10/07/2011  . Leukocytosis 10/07/2011  . Thyroid mass, left inferior lobe, 3.6cm [id number] 10/07/2011  . Hyponatremia 10/07/2011  . Obesity (BMI 30-39.9) 08/07/2011  . Post concussion syndrome    Ruben Im, PT 01/21/2016 3:03 PM Phone: [phone] Fax: [phone]  Alvera Singh 01/21/2016, 3:03 PM  [facility] [address] [place], [place], [address] Phone: [phone]   Fax:  [phone]  Name: Alyssa [name] MRN: [id number] Date of Birth: March 10, 1951

## 2016-01-24 ENCOUNTER — Ambulatory Visit
Payer: BC Managed Care – PPO | Attending: Orthopaedic Surgery | Admitting: Rehabilitative and Restorative Service Providers"

## 2016-01-24 DIAGNOSIS — M6281 Muscle weakness (generalized): Secondary | ICD-10-CM | POA: Diagnosis present

## 2016-01-24 DIAGNOSIS — R600 Localized edema: Secondary | ICD-10-CM | POA: Diagnosis present

## 2016-01-24 DIAGNOSIS — M25661 Stiffness of right knee, not elsewhere classified: Secondary | ICD-10-CM | POA: Diagnosis present

## 2016-01-24 DIAGNOSIS — R2689 Other abnormalities of gait and mobility: Secondary | ICD-10-CM | POA: Insufficient documentation

## 2016-01-24 DIAGNOSIS — M25561 Pain in right knee: Secondary | ICD-10-CM | POA: Diagnosis not present

## 2016-01-24 NOTE — Patient Instructions (Signed)
Advised pt to ice and rest at home this evening due to busy day and that she may be sore tomorrow due to given activity level. Pt agreed. No increased pain after tx compared to arrival

## 2016-01-24 NOTE — Therapy (Signed)
[facility] [address] [place], [place], [address] Phone: [phone]   Fax:  [phone]  Physical Therapy Treatment  Patient Details  Name: Alyssa [name] MRN: [id number] Date of Birth: 1950/07/31 Referring Provider: Rodell Perna, MD  Encounter Date: 01/24/2016    Past Medical History  Diagnosis Date  . Hyperlipidemia   . Thyroid disease   . Abdominal pain   . Ventral hernia   . Hypertension     PCP DR Nolon Rod  . Recurrent upper respiratory infection (URI)     FLU 08/23/11-08/29/11 then URI 08/29/11- 09/08/11- S/P zpack and steroids-  improved      no cough or congestion  . Hypothyroidism   . Hernia, incisional periumbilical, incarcerated 6/31/4970  . Thyroid mass, left inferior lobe, 3.6cm [id number] 10/07/2011  . Pulmonary embolism ([place]) 2013  . Hot flashes   . Peripheral vascular disease ([place])     pulmonary embolis-still have some  . Cancer ([place])     breast  . Breast cancer ([place]) 06/2013    left upper inner  . Hx of radiation therapy 09/09/13- 10/13/13    left breast 5000 cGy in 25 sessions, no boost  . Post concussion syndrome     4 yrs ago- sees [facility]- Dr Leonie Man every 6 months-   has sleep study 2011 following accident but was neg per patient-  short term memory issues, dates and times  . Family history of adverse reaction to anesthesia     Patients mother had to be resusciated after having anesthesia  . Diabetes mellitus without complication ([facility])     Takes Tradjenta  . GERD (gastroesophageal reflux disease)   . IFOYDXAJ(287.8)     Takes Depakote    Past Surgical History  Procedure Laterality Date  . Appendectomy  2002    lap appy.  Dr. Hassell Done  . Carpal tunnel release  10/2009-12/2010    left - right  . Tarsal tunnel release Bilateral   . Cesarean section  X 2  . Ventral hernia repair  09/29/2011    Procedure: LAPAROSCOPIC VENTRAL HERNIA;  Surgeon: Adin Hector, MD;  Location: [facility];   Service: General;  Laterality: N/A;  Laparoscopic Ventral Wall Hernia Repair with Mesh  . Breast lumpectomy with needle localization and axillary sentinel lymph node bx Left 08/04/2013    Procedure: BREAST LUMPECTOMY WITH NEEDLE LOCALIZATION AND AXILLARY SENTINEL LYMPH NODE Biopsy x 3;  Surgeon: Rolm Bookbinder, MD;  Location: [facility];  Service: General;  Laterality: Left;  . Colonoscopy    . Hernia repair  09/15/11    Ventral w/mesh  . Thyroidectomy, partial    . Knee arthroplasty Right 12/22/2015    Procedure: COMPUTER ASSISTED TOTAL KNEE ARTHROPLASTY;  Surgeon: Marybelle Killings, MD;  Location: [facility];  Service: Orthopedics;  Laterality: Right;  . Joint replacement    . Breast surgery    . Laparoscopic cholecystectomy  2006    Dr. Bubba Camp    There were no vitals filed for this visit.      Subjective Assessment - 01/24/16 1609    Subjective I went to the Dr today and he made me stretch into bending 3x for 5 min each. i have done alot today and am really sore and tired. i even got on the CPM 3x. i have had too much therapy already today   Pertinent History history of breast cancer   Limitations Standing;Walking   How long can you walk comfortably? [place]  minutes or less   Patient Stated Goals improve flexion, wean from walker, improve strength, reduce pain   Currently in Pain? Yes   Pain Score 5    Pain Location Knee   Pain Orientation Right   Pain Descriptors / Indicators Aching   Pain Type Surgical pain   Pain Onset 1 to 4 weeks ago   Pain Frequency Constant   Aggravating Factors  too  much activity   Pain Relieving Factors rest, meds   Effect of Pain on Daily Activities moderate   Multiple Pain Sites No                         [facility] Adult PT Treatment/Exercise - 01/24/16 0001    Ambulation/Gait   Gait Comments discussed importance of flexing knee with gait   Knee/Hip Exercises: Standing   Other Standing Knee Exercises wall slides limited ROM x 10 with increased  weightshift as tolerated to R; wall slide isometric x 3 with up to 5 sec hold with pt difficulty performing. pt reports slight pop with last isometric and stated knee feeling funny after but with gait to bed, pt reports "funny" feeling to disappear   Knee/Hip Exercises: Seated   Other Seated Knee/Hip Exercises seated R knee flexion to tolerance with alternating between flexing knee and scooting glutes forward to increase knee flexion with varying holds from 5 sec to 45 sec   Knee/Hip Exercises: Supine   Other Supine Knee/Hip Exercises windshield wipers x 15, quad set R x 20, SLR x 20 with min extension lag noted, isometric SLR with ankle pump x 20, bridge x 15, bridge x 10 in varying angles of knee flexion; SLR with hip circles CW and CCW x 10 each x 2 sets; knee AROM measured at 76 degrees flexion; pt stated MD measured after his stretching at 88 degrees   Modalities   Modalities Cryotherapy   Cryotherapy   Number Minutes Cryotherapy --  10 min   Cryotherapy Location Knee                  PT Short Term Goals - 01/24/16 1628    PT SHORT TERM GOAL #1   Title be independent in initial HEP   Status Achieved   PT SHORT TERM GOAL #2   Title demonstrate Rt knee AROM flexion to > or = to 90 degrees to improve sitting and negotiating steps without substitution   Time 4   Period Weeks   Status On-going   PT SHORT TERM GOAL #3   Title wean from walker to use of cane for all distances   Status Achieved   PT SHORT TERM GOAL #4   Title report < or = to 4/10 Rt knee pain with standing and walking   Status Achieved           PT Long Term Goals - 01/24/16 1629    PT LONG TERM GOAL #1   Title be independent in advanced HEP   Time 8   Period Weeks   Status On-going   PT LONG TERM GOAL #2   Title reduce FOTO to < or = to 43% limitation   Time 8   Period Weeks   Status On-going   PT LONG TERM GOAL #3   Title ascend steps with step-over-step with use of 1 rail   Time 8   Period  Weeks   Status On-going   PT LONG TERM GOAL #4  Title demonstrate Rt knee AROM -5 to 100 degrees to improve mobility without substitution   Time 8   Period Weeks   Status On-going   PT LONG TERM GOAL #5   Title imrprove strength and endurance to walk for 45 minutes in the community without need for rest   Time 8   Period Weeks   Status On-going   PT LONG TERM GOAL #6   Title demonstrate symmetry with ambulation on level surface with use of cane   Time 8   Period Weeks   Status On-going               Plan - 01/24/16 1621    Clinical Impression Statement Pt continues to have difficulty with ROM into flexion and extension in all positions. Question benefit from manipulation? Extension lag still present illustrating quad weakness. abnormal gait very apparent with decreased knee flexion with swing thru and with flat foot in all phases.    Rehab Potential Good   PT Frequency 3x / week   PT Duration 8 weeks   PT Treatment/Interventions ADLs/Self Care Home Management;Cryotherapy;Electrical Stimulation;Moist Heat;Therapeutic exercise;Therapeutic activities;Functional mobility training;Stair training;Gait training;Ultrasound;Balance training;Neuromuscular re-education;Patient/family education;Manual techniques;Vasopneumatic Device;Taping;Passive range of motion;Scar mobilization   PT Next Visit Plan RT knee ROM, manual techniques to improve flexion & extension;  vasocompression; quad activation   Consulted and Agree with Plan of Care Patient      Patient will benefit from skilled therapeutic intervention in order to improve the following deficits and impairments:  Abnormal gait, Decreased range of motion, Difficulty walking, Decreased endurance, Decreased activity tolerance, Pain, Increased edema, Decreased strength, Impaired flexibility  Visit Diagnosis: Pain in right knee  Stiffness of right knee, not elsewhere classified  Muscle weakness (generalized)  Other abnormalities of  gait and mobility     Problem List Patient Active Problem List   Diagnosis Date Noted  . Status post total right knee replacement 12/22/2015  . Hepatic steatosis 02/09/2015  . Hot flashes 09/09/2014  . Osteopenia 09/09/2014  . Breast cancer of upper-inner quadrant of left female breast ([place]) 07/23/2013  . Memory loss 07/14/2013  . Headache 07/14/2013  . Other malaise and fatigue 07/14/2013  . Acute pulmonary embolism ([place]) 10/07/2011  . Leukocytosis 10/07/2011  . Thyroid mass, left inferior lobe, 3.6cm [id number] 10/07/2011  . Hyponatremia 10/07/2011  . Obesity (BMI 30-39.9) 08/07/2011  . Post concussion syndrome     Myra Rude, PT 01/24/2016, 4:36 PM  [facility] [address], [place], [place], [address] Phone: [phone]   Fax:  [phone]  Name: LARIA GRIMMETT MRN: [id number] Date of Birth: February 08, 1951

## 2016-01-27 ENCOUNTER — Encounter: Payer: Self-pay | Admitting: Physical Therapy

## 2016-01-27 ENCOUNTER — Ambulatory Visit: Payer: BC Managed Care – PPO | Admitting: Physical Therapy

## 2016-01-27 DIAGNOSIS — M25561 Pain in right knee: Secondary | ICD-10-CM

## 2016-01-27 DIAGNOSIS — R2689 Other abnormalities of gait and mobility: Secondary | ICD-10-CM

## 2016-01-27 DIAGNOSIS — M25661 Stiffness of right knee, not elsewhere classified: Secondary | ICD-10-CM

## 2016-01-27 DIAGNOSIS — R600 Localized edema: Secondary | ICD-10-CM

## 2016-01-27 DIAGNOSIS — M6281 Muscle weakness (generalized): Secondary | ICD-10-CM

## 2016-01-27 NOTE — Therapy (Signed)
[facility] [address] [place], [place], [address] Phone: [phone]   Fax:  [phone]  Physical Therapy Treatment  Patient Details  Name: ANUREET BRUINGTON MRN: [id number] Date of Birth: May 24, 1951 Referring Provider: Rodell Perna, MD  Encounter Date: 01/27/2016      PT End of Session - 01/27/16 1009    Visit Number 6   Date for PT Re-Evaluation 03/08/16   PT Start Time 0930   PT Stop Time 1020   PT Time Calculation (min) 50 min   Activity Tolerance Patient tolerated treatment well   Behavior During Therapy [facility] for tasks assessed/performed      Past Medical History  Diagnosis Date  . Hyperlipidemia   . Thyroid disease   . Abdominal pain   . Ventral hernia   . Hypertension     PCP DR Nolon Rod  . Recurrent upper respiratory infection (URI)     FLU 08/23/11-08/29/11 then URI 08/29/11- 09/08/11- S/P zpack and steroids-  improved      no cough or congestion  . Hypothyroidism   . Hernia, incisional periumbilical, incarcerated 04/13/1940  . Thyroid mass, left inferior lobe, 3.6cm [id number] 10/07/2011  . Pulmonary embolism ([place]) 2013  . Hot flashes   . Peripheral vascular disease ([place])     pulmonary embolis-still have some  . Cancer ([place])     breast  . Breast cancer ([place]) 06/2013    left upper inner  . Hx of radiation therapy 09/09/13- 10/13/13    left breast 5000 cGy in 25 sessions, no boost  . Post concussion syndrome     4 yrs ago- sees [facility]- Dr Leonie Man every 6 months-   has sleep study 2011 following accident but was neg per patient-  short term memory issues, dates and times  . Family history of adverse reaction to anesthesia     Patients mother had to be resusciated after having anesthesia  . Diabetes mellitus without complication ([place])     Takes Tradjenta  . GERD (gastroesophageal reflux disease)   . YJEHUDJS(970.2)     Takes Depakote    Past Surgical History  Procedure Laterality Date  .  Appendectomy  2002    lap appy.  Dr. Hassell Done  . Carpal tunnel release  10/2009-12/2010    left - right  . Tarsal tunnel release Bilateral   . Cesarean section  X 2  . Ventral hernia repair  09/29/2011    Procedure: LAPAROSCOPIC VENTRAL HERNIA;  Surgeon: Adin Hector, MD;  [facility];  Service: General;  Laterality: N/A;  Laparoscopic Ventral Wall Hernia Repair with Mesh  . Breast lumpectomy with needle localization and axillary sentinel lymph node bx Left 08/04/2013    Procedure: BREAST LUMPECTOMY WITH NEEDLE LOCALIZATION AND AXILLARY SENTINEL LYMPH NODE Biopsy x 3;  Surgeon: Rolm Bookbinder, MD;  Location: [facility];  Service: General;  Laterality: Left;  . Colonoscopy    . Hernia repair  09/15/11    Ventral w/mesh  . Thyroidectomy, partial    . Knee arthroplasty Right 12/22/2015    Procedure: COMPUTER ASSISTED TOTAL KNEE ARTHROPLASTY;  Surgeon: Marybelle Killings, MD;  Location: [facility];  Service: Orthopedics;  Laterality: Right;  . Joint replacement    . Breast surgery    . Laparoscopic cholecystectomy  2006    Dr. Bubba Camp    There were no vitals filed for this visit.      Subjective Assessment - 01/27/16 0933    Subjective Pain  is sitll 6.5 at the lowest, pt has been performing CPM daily and performing HEP despite pain.   Pertinent History history of breast cancer   Limitations Standing;Walking   Patient Stated Goals improve flexion, wean from walker, improve strength, reduce pain   Currently in Pain? Yes   Pain Score 6    Pain Location Knee   Pain Orientation Right   Pain Type Surgical pain   Pain Onset 1 to 4 weeks ago                         [facility] Adult PT Treatment/Exercise - 01/27/16 0001    Knee/Hip Exercises: Stretches   Other Knee/Hip Stretches stair stretch flex and ext 2 x 1 minute each   Knee/Hip Exercises: Aerobic   Stationary Bike level 0 for AROM   Knee/Hip Exercises: Standing   Wall Squat 10 reps;5 seconds  ball behind back   Gait Training  stepping over object on floor to encourage knee flexion during gait x 30 reps   Knee/Hip Exercises: Seated   Heel Slides Right;2 sets;15 reps  with hold at end range to encourage stretch   Knee/Hip Exercises: Supine   Quad Sets Right;20 reps   Heel Slides Right;20 reps   Bridges Limitations bridges at various levels of knee flexion x 10 multiple positions   Straight Leg Raises Right;20 reps   Vasopneumatic   Number Minutes Vasopneumatic  15 minutes   Vasopnuematic Location  Knee   Vasopneumatic Pressure High   Vasopneumatic Temperature  3 flakes   Manual Therapy   Manual therapy comments --  pt with difficulty tolerating scar tissue massage and manual                PT Education - 01/27/16 1009    Education provided Yes   Education Details stepping over obstacles for HEP   Person(s) Educated Patient   Methods Explanation;Demonstration   Comprehension Returned demonstration;Verbalized understanding          PT Short Term Goals - 01/27/16 1010    PT SHORT TERM GOAL #1   Title be independent in initial HEP   Status Achieved   PT SHORT TERM GOAL #2   Title demonstrate Rt knee AROM flexion to > or = to 90 degrees to improve sitting and negotiating steps without substitution   Status On-going   PT SHORT TERM GOAL #3   Title wean from walker to use of cane for all distances   Status Achieved   PT SHORT TERM GOAL #4   Title report < or = to 4/10 Rt knee pain with standing and walking   Status On-going           PT Long Term Goals - 01/27/16 1011    PT LONG TERM GOAL #1   Title be independent in advanced HEP   Status On-going   PT LONG TERM GOAL #2   Title reduce FOTO to < or = to 43% limitation   Status On-going   PT LONG TERM GOAL #3   Title ascend steps with step-over-step with use of 1 rail   Status On-going   PT LONG TERM GOAL #4   Title demonstrate Rt knee AROM -5 to 100 degrees to improve mobility without substitution   Status On-going   PT LONG  TERM GOAL #5   Title imrprove strength and endurance to walk for 45 minutes in the community without need for rest   Status  On-going   PT LONG TERM GOAL #6   Title demonstrate symmetry with ambulation on level surface with use of cane   Status On-going               Plan - 01/27/16 1009    Clinical Impression Statement Pt unable to tolerate manual today due to pain and sensitivity in Rt knee, still able to perform AAROM and self AROM to increase knee ROM, pt motivated to improve   Rehab Potential Good   PT Frequency 3x / week   PT Duration 8 weeks   PT Treatment/Interventions ADLs/Self Care Home Management;Cryotherapy;Electrical Stimulation;Moist Heat;Therapeutic exercise;Therapeutic activities;Functional mobility training;Stair training;Gait training;Ultrasound;Balance training;Neuromuscular re-education;Patient/family education;Manual techniques;Vasopneumatic Device;Taping;Passive range of motion;Scar mobilization   PT Next Visit Plan RT knee ROM, manual techniques to improve flexion & extension;  vasocompression, gait   Consulted and Agree with Plan of Care Patient      Patient will benefit from skilled therapeutic intervention in order to improve the following deficits and impairments:  Abnormal gait, Decreased range of motion, Difficulty walking, Decreased endurance, Decreased activity tolerance, Pain, Increased edema, Decreased strength, Impaired flexibility  Visit Diagnosis: Pain in right knee  Stiffness of right knee, not elsewhere classified  Muscle weakness (generalized)  Localized edema  Other abnormalities of gait and mobility     Problem List Patient Active Problem List   Diagnosis Date Noted  . Status post total right knee replacement 12/22/2015  . Hepatic steatosis 02/09/2015  . Hot flashes 09/09/2014  . Osteopenia 09/09/2014  . Breast cancer of upper-inner quadrant of left female breast ([place]) 07/23/2013  . Memory loss 07/14/2013  . Headache  07/14/2013  . Other malaise and fatigue 07/14/2013  . Acute pulmonary embolism ([place]) 10/07/2011  . Leukocytosis 10/07/2011  . Thyroid mass, left inferior lobe, 3.6cm [id number] 10/07/2011  . Hyponatremia 10/07/2011  . Obesity (BMI 30-39.9) 08/07/2011  . Post concussion syndrome     Isabelle Course, PT, DPT  01/27/2016, 10:12 AM  [facility] [address], [place], [place], [address] Phone: [phone]   Fax:  [phone]  Name: RHILEY TARVER MRN: [id number] Date of Birth: 06/17/1951

## 2016-02-02 ENCOUNTER — Encounter: Payer: Self-pay | Admitting: Physical Therapy

## 2016-02-02 ENCOUNTER — Ambulatory Visit: Payer: BC Managed Care – PPO | Admitting: Physical Therapy

## 2016-02-02 DIAGNOSIS — M25561 Pain in right knee: Secondary | ICD-10-CM | POA: Diagnosis not present

## 2016-02-02 DIAGNOSIS — M25661 Stiffness of right knee, not elsewhere classified: Secondary | ICD-10-CM

## 2016-02-02 DIAGNOSIS — R2689 Other abnormalities of gait and mobility: Secondary | ICD-10-CM

## 2016-02-02 DIAGNOSIS — R600 Localized edema: Secondary | ICD-10-CM

## 2016-02-02 DIAGNOSIS — M6281 Muscle weakness (generalized): Secondary | ICD-10-CM

## 2016-02-02 NOTE — Therapy (Signed)
[facility] [address], [place], [place], [address] Phone: [phone]   Fax:  [phone]  Physical Therapy Treatment  Patient Details  Name: Alyssa [name] MRN: [id number] Date of Birth: 1951/07/22 Referring Provider: Rodell Perna, MD  Encounter Date: 02/02/2016      PT End of Session - 02/02/16 0935    Visit Number 7   Date for PT Re-Evaluation 03/08/16   PT Start Time 0930   PT Stop Time 1035   PT Time Calculation (min) 65 min   Activity Tolerance Patient tolerated treatment well   Behavior During Therapy [facility] for tasks assessed/performed      Past Medical History  Diagnosis Date  . Hyperlipidemia   . Thyroid disease   . Abdominal pain   . Ventral hernia   . Hypertension     PCP DR Nolon Rod  . Recurrent upper respiratory infection (URI)     FLU 08/23/11-08/29/11 then URI 08/29/11- 09/08/11- S/P zpack and steroids-  improved      no cough or congestion  . Hypothyroidism   . Hernia, incisional periumbilical, incarcerated 09/16/8248  . Thyroid mass, left inferior lobe, 3.6cm [id number] 10/07/2011  . Pulmonary embolism ([place]) 2013  . Hot flashes   . Peripheral vascular disease ([place])     pulmonary embolis-still have some  . Cancer ([place])     breast  . Breast cancer ([place]) 06/2013    left upper inner  . Hx of radiation therapy 09/09/13- 10/13/13    left breast 5000 cGy in 25 sessions, no boost  . Post concussion syndrome     4 yrs ago- sees [facility]- Dr Leonie Man every 6 months-   has sleep study 2011 following accident but was neg per patient-  short term memory issues, dates and times  . Family history of adverse reaction to anesthesia     Patients mother had to be resusciated after having anesthesia  . Diabetes mellitus without complication ([place])     Takes Tradjenta  . GERD (gastroesophageal reflux disease)   . QBVQXIHW(388.8)     Takes Depakote    Past Surgical History  Procedure Laterality Date  .  Appendectomy  2002    lap appy.  Dr. Hassell Done  . Carpal tunnel release  10/2009-12/2010    left - right  . Tarsal tunnel release Bilateral   . Cesarean section  X 2  . Ventral hernia repair  09/29/2011    Procedure: LAPAROSCOPIC VENTRAL HERNIA;  Surgeon: Adin Hector, MD;  Location: [facility];  Service: General;  Laterality: N/A;  Laparoscopic Ventral Wall Hernia Repair with Mesh  . Breast lumpectomy with needle localization and axillary sentinel lymph node bx Left 08/04/2013    Procedure: BREAST LUMPECTOMY WITH NEEDLE LOCALIZATION AND AXILLARY SENTINEL LYMPH NODE Biopsy x 3;  Surgeon: Rolm Bookbinder, MD;  Location: [place];  Service: General;  Laterality: Left;  . Colonoscopy    . Hernia repair  09/15/11    Ventral w/mesh  . Thyroidectomy, partial    . Knee arthroplasty Right 12/22/2015    Procedure: COMPUTER ASSISTED TOTAL KNEE ARTHROPLASTY;  Surgeon: Marybelle Killings, MD;  Location: [place];  Service: Orthopedics;  Laterality: Right;  . Joint replacement    . Breast surgery    . Laparoscopic cholecystectomy  2006    Dr. Bubba Camp    There were no vitals filed for this visit.      Subjective Assessment - 02/02/16 0933    Subjective Good  days bad days.    Currently in Pain? Yes   Pain Score 5    Pain Location Knee   Pain Orientation Right   Aggravating Factors  Pretty constant, but mainly if she overdoes   Pain Relieving Factors Rest, meds   Multiple Pain Sites No            [facility] PT Assessment - 02/02/16 0001    AROM   Right Knee Extension 10   Right Knee Flexion 80  end of session,                      [facility] Adult PT Treatment/Exercise - 02/02/16 0001    Knee/Hip Exercises: Stretches   Other Knee/Hip Stretches stair stretch flex: rocking initially x1 min, then hold 20 sec 10x, repeat for ext   Knee/Hip Exercises: Aerobic   Nustep L1 x 10 min   Knee/Hip Exercises: Standing   Wall Squat 10 reps;5 seconds  ball behind back   Knee/Hip Exercises: Seated   Long Arc  Quad AROM;AAROM;Right;2 sets;10 reps   Heel Slides Right;2 sets;15 reps  with hold at end range to encourage stretch   Knee/Hip Exercises: Supine   Short Arc Quad Sets Strengthening;Right;2 sets;10 reps   Heel Slides Right;20 reps   Vasopneumatic   Number Minutes Vasopneumatic  15 minutes   Vasopnuematic Location  Knee   Vasopneumatic Pressure High   Vasopneumatic Temperature  3 flakes   Manual Therapy   Soft tissue mobilization Quads, scar massage, MFR peripatella, Static stretching with distratction for flexion                   PT Short Term Goals - 02/02/16 1022    PT SHORT TERM GOAL #2   Title demonstrate Rt knee AROM flexion to > or = to 90 degrees to improve sitting and negotiating steps without substitution   Time 4   Period Weeks   Status On-going  Slowly improving           PT Long Term Goals - 01/27/16 1011    PT LONG TERM GOAL #1   Title be independent in advanced HEP   Status On-going   PT LONG TERM GOAL #2   Title reduce FOTO to < or = to 43% limitation   Status On-going   PT LONG TERM GOAL #3   Title ascend steps with step-over-step with use of 1 rail   Status On-going   PT LONG TERM GOAL #4   Title demonstrate Rt knee AROM -5 to 100 degrees to improve mobility without substitution   Status On-going   PT LONG TERM GOAL #5   Title imrprove strength and endurance to walk for 45 minutes in the community without need for rest   Status On-going   PT LONG TERM GOAL #6   Title demonstrate symmetry with ambulation on level surface with use of cane   Status On-going               Plan - 02/02/16 0936    Clinical Impression Statement PROM for knee flexion slightly better, extension holding steady at 10 degrees from 0. Getting in/out car is easier per pt report and all bed mobility is significantly better.    Rehab Potential Good   PT Frequency 3x / week   PT Duration 8 weeks   PT Treatment/Interventions ADLs/Self Care Home  Management;Cryotherapy;Electrical Stimulation;Moist Heat;Therapeutic exercise;Therapeutic activities;Functional mobility training;Stair training;Gait training;Ultrasound;Balance training;Neuromuscular re-education;Patient/family education;Manual techniques;Vasopneumatic Device;Taping;Passive range  of motion;Scar mobilization   PT Next Visit Plan To MD next Tuesday.   Consulted and Agree with Plan of Care Patient      Patient will benefit from skilled therapeutic intervention in order to improve the following deficits and impairments:  Abnormal gait, Decreased range of motion, Difficulty walking, Decreased endurance, Decreased activity tolerance, Pain, Increased edema, Decreased strength, Impaired flexibility  Visit Diagnosis: Pain in right knee  Stiffness of right knee, not elsewhere classified  Muscle weakness (generalized)  Localized edema  Other abnormalities of gait and mobility     Problem List Patient Active Problem List   Diagnosis Date Noted  . Status post total right knee replacement 12/22/2015  . Hepatic steatosis 02/09/2015  . Hot flashes 09/09/2014  . Osteopenia 09/09/2014  . 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 Memory loss 07/14/2013  . Headache 07/14/2013  . Other malaise and fatigue 07/14/2013  . Acute pulmonary embolism ([place]) 10/07/2011  . Leukocytosis 10/07/2011  . Thyroid mass, left inferior lobe, 3.6cm [id number] 10/07/2011  . Hyponatremia 10/07/2011  . Obesity (BMI 30-39.9) 08/07/2011  . Post concussion syndrome     Taheerah Guldin, PTA 02/02/2016, 10:25 AM  [facility] [address], [place], [place], [address] Phone: [phone]   Fax:  [phone]  Name: Alyssa [name] MRN: [id number] Date of Birth: [birth year]

## 2016-02-03 ENCOUNTER — Ambulatory Visit (HOSPITAL_BASED_OUTPATIENT_CLINIC_OR_DEPARTMENT_OTHER): Payer: BC Managed Care – PPO | Admitting: Adult Health

## 2016-02-03 ENCOUNTER — Telehealth: Payer: Self-pay | Admitting: Adult Health

## 2016-02-03 ENCOUNTER — Encounter: Payer: Self-pay | Admitting: Adult Health

## 2016-02-03 VITALS — BP 142/71 | HR 80 | Temp 98.4°F | Resp 18 | Ht 62.5 in | Wt 226.9 lb

## 2016-02-03 DIAGNOSIS — T451X5A Adverse effect of antineoplastic and immunosuppressive drugs, initial encounter: Secondary | ICD-10-CM

## 2016-02-03 DIAGNOSIS — C50212 Malignant neoplasm of upper-inner quadrant of left female breast: Secondary | ICD-10-CM | POA: Diagnosis not present

## 2016-02-03 DIAGNOSIS — Z79811 Long term (current) use of aromatase inhibitors: Secondary | ICD-10-CM

## 2016-02-03 DIAGNOSIS — Z170 Estrogen receptor positive status [ER+]: Secondary | ICD-10-CM

## 2016-02-03 DIAGNOSIS — N951 Menopausal and female climacteric states: Secondary | ICD-10-CM

## 2016-02-03 DIAGNOSIS — M2550 Pain in unspecified joint: Secondary | ICD-10-CM

## 2016-02-03 MED ORDER — EXEMESTANE 25 MG PO TABS: 25.0000 mg | ORAL_TABLET | Freq: Every day | ORAL | Status: DC

## 2016-02-03 MED ORDER — TRAMADOL HCL 50 MG PO TABS: 50.0000 mg | ORAL_TABLET | Freq: Every day | ORAL | Status: DC | PRN

## 2016-02-03 NOTE — Progress Notes (Signed)
CLINIC:  Survivorship   REASON FOR VISIT:  Routine follow-up for history of breast cancer.   BRIEF ONCOLOGIC HISTORY:    Breast cancer of upper-inner quadrant of left female breast ([place])   07/21/2013 Mammogram Diagnostic mammo & breast US.  (L) breast with irregular high-density mass with ill-defined margings. Palpable finding at 11 o'clock position. On [place], irregular hypoechoic mass with ill-defined margins measuring 1.1 x 0.9 x 1.1 cm. Normal axilla on imaging   07/21/2013 Initial Biopsy Left breast needle core bx (11 o'clock).  IDC, grade 1, ER+ (100%), PR+ (86%), HER2 neg (ratio 1.19). Ki67 7%.    07/23/2013 Initial Diagnosis Breast cancer of upper-inner quadrant of left female breast ([place])   08/04/2013 Pathologic Stage pT1c, pN0: Stage IA    08/04/2013 Miscellaneous Oncotype testing. Recurrence score 14; ROR 9% (low risk)   08/04/2013 Surgery (L) lumpectomy with SLNB Donne Hazel). IDC with micro calcs & DCIS, grade 1, spanning 1.1 cm. 0/3 left axillary SLN neg. Margins neg. HER2 repeated and remains negative (ratio 1.52).   09/09/2013 - 10/13/2013 Radiation Therapy Adjuvant breast radiation Valere Dross). Left breast:50 Gy in 25 sessions, no boost   10/29/2013 Imaging DEXA scan: Osteopenia. T-score -2.2.    11/19/2013 - 12/03/2013 Anti-estrogen oral therapy Letrozole started 2.5 mg daily.  Letrozole stopped due to allergic reaction (rash)    12/05/2013 - 12/01/2014 Anti-estrogen oral therapy Anastrazole daily.  Anastrazole stopped due to side effects.    11/05/2014 Miscellaneous Zolendronate started   01/2015 -  Anti-estrogen oral therapy Exemestane daily.  Planned duration of anti-estrogen therapy: 5 years.    07/16/2015 Mammogram ([place]) No evidence of malignancy within either breast. Stable postsurgical changes within the left breast.    INTERVAL HISTORY:  Alyssa [name] presents to the Survivorship Clinic today for routine follow-up for her history of breast cancer.  Overall,  she reports feeling pretty well, with the exception of generalized pain and limited mobility after her recent right knee replacement surgery on 12/22/15 at [facility] She is currently in PT for her knee.  She is taking the Exemestane as prescribed; she is not having any allergic reactions to the medication.  She has a lot of hot flashes "all day every day", but carries around a fan which works for her.  She has body aches, mostly in her lower back and shoulders, which the Tramadol has been helpful for her.  She does not take pain medication daily, but only takes the Tramadol every once in awhile (about 3 times per week).  She tolerates the Tramadol well and prefers it over other opiate pain medications due to side effects.  Her bowels are regular.   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  She is here today with her friend, Alyssa [name], who is a friend from work "who's been with me every step of the way with my cancer."    REVIEW OF SYSTEMS:  Review of Systems  Constitutional: Negative for fever and chills.  HENT: Negative for sore throat.   Eyes: Negative for blurred vision.  Respiratory: Negative for cough and shortness of breath.   Cardiovascular: Negative for chest pain and palpitations.  Gastrointestinal: Negative for diarrhea and constipation.  Genitourinary: Negative for dysuria.  Musculoskeletal: Positive for back pain and joint pain. Negative for falls.       -Recent (R) knee surgery; also generalized throbbing pain "all over"  -Ambulating with cane   Skin: Negative for rash.  Neurological: Negative  for dizziness and headaches.  Endo/Heme/Allergies:       (+) hot flashes   Psychiatric/Behavioral: Negative for depression. The patient is not nervous/anxious.   GU: Denies vaginal bleeding, discharge, or dryness.  Breast: Denies any new nodularity, masses, tenderness, nipple changes, or nipple discharge.   A 14-point review of systems was completed and was  negative, except as noted above.    PAST MEDICAL/SURGICAL HISTORY:  Past Medical History  Diagnosis Date  . Hyperlipidemia   . Thyroid disease   . Abdominal pain   . Ventral hernia   . Hypertension     PCP DR Nolon Rod  . Recurrent upper respiratory infection (URI)     FLU 08/23/11-08/29/11 then URI 08/29/11- 09/08/11- S/P zpack and steroids-  improved      no cough or congestion  . Hypothyroidism   . Hernia, incisional periumbilical, incarcerated 8/41/3244  . Thyroid mass, left inferior lobe, 3.6cm [id number] 10/07/2011  . Pulmonary embolism ([place]) 2013  . Hot flashes   . Peripheral vascular disease ([place])     pulmonary embolis-still have some  . Cancer ([place])     breast  . Breast cancer ([place]) 06/2013    left upper inner  . Hx of radiation therapy 09/09/13- 10/13/13    left breast 5000 cGy in 25 sessions, no boost  . Post concussion syndrome     4 yrs ago- sees [facility]- Dr Leonie Man every 6 months-   has sleep study 2011 following accident but was neg per patient-  short term memory issues, dates and times  . Family history of adverse reaction to anesthesia     Patients mother had to be resusciated after having anesthesia  . Diabetes mellitus without complication ([place])     Takes Tradjenta  . GERD (gastroesophageal reflux disease)   . DGUYQIHK(742.5)     Takes Depakote   Past Surgical History  Procedure Laterality Date  . Appendectomy  2002    lap appy.  Dr. Hassell Done  . Carpal tunnel release  10/2009-12/2010    left - right  . Tarsal tunnel release Bilateral   . Cesarean section  X 2  . Ventral hernia repair  09/29/2011    Procedure: LAPAROSCOPIC VENTRAL HERNIA;  Surgeon: Adin Hector, MD;  [facility];  Service: General;  Laterality: N/A;  Laparoscopic Ventral Wall Hernia Repair with Mesh  . Breast lumpectomy with needle localization and axillary sentinel lymph node bx Left 08/04/2013    Procedure: BREAST LUMPECTOMY WITH NEEDLE LOCALIZATION AND AXILLARY SENTINEL LYMPH NODE  Biopsy x 3;  Surgeon: Rolm Bookbinder, MD;  Location: [place];  Service: General;  Laterality: Left;  . Colonoscopy    . Hernia repair  09/15/11    Ventral w/mesh  . Thyroidectomy, partial    . Knee arthroplasty Right 12/22/2015    Procedure: COMPUTER ASSISTED TOTAL KNEE ARTHROPLASTY;  Surgeon: Marybelle Killings, MD;  Location: [place];  Service: Orthopedics;  Laterality: Right;  . Joint replacement    . Breast surgery    . Laparoscopic cholecystectomy  2006    Dr. Bubba Camp     ALLERGIES:  Allergies  Allergen Reactions  . Aleve [Naproxen Sodium] Hives, Itching and Swelling  . Aromasin [Exemestane] Anaphylaxis, Itching and Swelling    Pt stated "I can take this medication for a few months at a time and I take a benadryl when I feel it starting to come on" Patient has facial swelling, itching, and throat swelling  . Letrozole Shortness Of Breath,  Rash and Other (See Comments)    Swelling - feet, eyes,hands;   Speech garbled  . Penicillins Hives, Swelling and Rash    All over the body. Has patient had a PCN reaction causing immediate rash, facial/tongue/throat swelling, SOB or lightheadedness with hypotension: Yes Has patient had a PCN reaction causing severe rash involving mucus membranes or skin necrosis: No Has patient had a PCN reaction that required hospitalization No Has patient had a PCN reaction occurring within the last 10 years: No If all of the above answers are "NO", then may proceed with Cephalosporin use.   . Codeine Other (See Comments)    hallucinations  . Other Swelling    Swelling of  Eyes   Wheat ,oranges banana, zucchini,cayenne,chili  Peppers, sweet potatoes, pumpkin  . Percocet [Oxycodone-Acetaminophen] Other (See Comments)    Causes syncope day after medication has been taken.  . Vicodin [Hydrocodone-Acetaminophen] Other (See Comments)    Causes syncope day after medication has been taken.  . Prednisone Other (See Comments)    Unknown     CURRENT MEDICATIONS:    Current Outpatient Prescriptions on File Prior to Visit  Medication Sig Dispense Refill  . ALPRAZolam (XANAX) 0.5 MG tablet Take 0.5 mg by mouth at bedtime as needed for anxiety or sleep.     . Coenzyme Q10 (CO Q 10) 100 MG CAPS Take 200 mg by mouth daily.    . divalproex (DEPAKOTE) 500 MG DR tablet Take 500 mg by mouth daily.    . hydrochlorothiazide (HYDRODIURIL) 25 MG tablet Take 25 mg by mouth at bedtime.     Marland Kitchen linagliptin (TRADJENTA) 5 MG TABS tablet Take 5 mg by mouth daily.    Marland Kitchen lisinopril (PRINIVIL,ZESTRIL) 5 MG tablet Take 5 mg by mouth daily.    Marland Kitchen NATURE-THROID 97.5 MG TABS Take 48.75-97.5 mg by mouth 2 (two) times daily. 97.34m in the morning, 48.752min the evening    . rosuvastatin (CRESTOR) 5 MG tablet Take 5 mg by mouth daily.     No current facility-administered medications on file prior to visit.     ONCOLOGIC FAMILY HISTORY:  Family History  Problem Relation Age of Onset  . Malignant hyperthermia Mother   . Heart attack Father   . Stroke Father   . Diabetes      Grandmother  . Other      respiratory- Grandmother  . Cancer Maternal Grandmother [phone]  breast    GENETIC COUNSELING/TESTING: None.   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  She does not smoke or use illicit drugs. She drinks alcohol occasionally.   PHYSICAL EXAMINATION:  Vital Signs: Filed Vitals:   02/03/16 1310  BP: 142/71  Pulse: 80  Temp: 98.4 F (36.9 C)  Resp: 18   Filed Weights   02/03/16 1310  Weight: 226 lb 14.4 oz (102.921 kg)   General: Well-nourished, well-appearing female in no acute distress.  She is accompanied by her friend today.   HEENT: Head is atraumatic and normocephalic.  Pupils equal and reactive to light and accomodation. Conjunctivae clear without exudate.  Sclerae anicteric. Oral mucosa is pink, moist.  Oropharynx is pink  without lesions or erythema.  Lymph: No cervical, supraclavicular, or infraclavicular lymphadenopathy noted on palpation.  Cardiovascular: Regular rate and rhythm.. Marland Kitchenespiratory: Clear to auscultation bilaterally. Chest expansion symmetric; breathing non-labored.  Breast  Exam:  -Right breast: No appreciable masses on palpation. Skin on breast is intact. No nipple inversion or discharge.   -Left breast: No appreciable masses on palpation. Noted fibrosis associated with previous breast irradiation. Skin on breast is intact; healed scar without erythema or nodularity.  -Axilla: No axillary adenopathy bilaterally.  GI: Abdomen soft and round; non-tender, non-distended. Bowel sounds normoactive. No hepatosplenomegaly.   GU: Deferred.  Neuro/MSK: No focal deficits. Ambulates with cane. Limited ROM to right knee.  Psych: Mood and affect normal and appropriate for situation.  Extremities: No edema. Skin: Warm and dry.  LABORATORY DATA:  None for this visit.   DIAGNOSTIC IMAGING:  Most recent mammogram: 07/16/15 ([facility]) FINDINGS: There are stable postsurgical changes within the left breast. There are no new dominant masses, suspicious calcifications or secondary signs of malignancy within either breast.  Mammographic images were processed with CAD.  IMPRESSION: No evidence of malignancy within either breast. Stable postsurgical changes within the left breast.    ASSESSMENT AND PLAN:  Ms.. Seaborn is a pleasant 65 y.o. female with history of Stage IA left breast invasive ductal carcinoma, ER+/PR+/HER2-, diagnosed in 06/2013, treated with lumpectomy, adjuvant radiation therapy, and anti-estrogen therapy currently with Exemestane (started with Letrozole in 10/2013 and had rash, then Anastrazole and had rash/allergic reaction, then switched to Exemestane).  She presents to the Survivorship Clinic for surveillance and routine follow-up.   1. History of Stage IA left breast  cancer:  Ms. Mills is currently clinically and radiographically without evidence of diease or recurrence of breast cancer. She will follow-up with her medical oncologist, Dr. Jana Hakim in 07/2016 with history & physical exam per surveillance protocol.  Since she is currently tolerating the Exemestane well, she will continue her endocrine therapy as prescribed by Dr. Jana Hakim. She was instructed to make me aware if she begins to experience any side effects of the medication and I would see her back in clinic to help manage those side effects, as needed.  She will be due for diagnostic mammogram in 06/2016; orders placed today.   2. Hot flashes related to anti-estrogen therapy: We discussed different pharmacologic options to manage aromatase-inhibitor associated hot flashes.  She is not interested in starting any new medications.  She is fine with current management with fans/keeping cool.  We discussed that there is limited data to support acupuncture, but anecdotally some patients have found it helpful.  She understands that there are different options to manage the effects of AI if they become more bothersome for her in the future.    3. Aromatase-inhibitor arthralgias: She is experiencing generalized "all over" body aches, likely related to aromatase inhibitor therapy.  She was previously prescribed Tramadol in 10/2015 by Dr. Jana Hakim and this offers her the relief of the pain, without any negative side effects that other opiates she has tried in the past may have (like nausea, drowsiness, etc).  She would like a refill of the Tramadol today.  She recently has had (R) knee replacement surgery and the prescriptions for other opiates have been for her post-operative pain, which is not being managed by the cancer center.  Her last prescription was for Percocet on 01/21/16, where she received a 6 day supply.  Thus, I provided her with a prescription for Tramadol today.    As it mandated by the [place] STOP Act  (Strengthen Opioid Misuse Prevention), the [place] Controlled Substance Reporting System was reviewed for this patient. Below is the past 49-month of controlled substance  prescriptions provided for her as displayed by the registry.  I have reviewed her case with Dr. Frederich Cha, my supervising physician, and he agrees that continuing opiate therapy with Tramadol is appropriate at this time.  A prescription for Tramadol 50 mg po Dailyprn, #30, no refills, was provided today.          4. Constipation prevention: Given her continued use of opiates, I reiterated the importance of maintaining an adequate bowel regimen with stool softeners and/or laxatives as needed.  She currently is having no issues with constipation.    5. Bone Health:  Given Ms. Pogosyan's history of breast cancer and her current treatment regimen including chemoprevention with Exemestane, she is at risk for bone demineralization.  Her last DEXA scan was on 10/29/13 and showed osteopenia.  She began therapy with Zometa and had her last infusion in 10/2015. She was encouraged to increase her consumption of foods rich in calcium, as well as increase her weight-bearing activities.  She was given education on specific food and activities to promote bone health.     Dispo:  -Mammogram in 06/2016; orders placed today -Return to cancer center to see Dr. Jana Hakim in 07/2016.    A total of 30 minutes of face-to-face time was spent with this patient with greater than 50% of that time in counseling and care-coordination.   Mike Craze, NP [facility] [place] [phone]   Note: PRIMARY CARE PROVIDER [place], [place] [phone]

## 2016-02-03 NOTE — Patient Instructions (Signed)
Thank you for coming in to see me today!  I really enjoyed meeting you!   Please feel free to call me with any questions or concerns.  Mike Craze, NP [place] [phone]

## 2016-02-03 NOTE — Telephone Encounter (Signed)
per pof to sch pt appt-gave pt copy of avs °

## 2016-02-04 ENCOUNTER — Encounter: Payer: Self-pay | Admitting: Physical Therapy

## 2016-02-04 ENCOUNTER — Ambulatory Visit: Payer: BC Managed Care – PPO | Admitting: Physical Therapy

## 2016-02-04 DIAGNOSIS — M25561 Pain in right knee: Secondary | ICD-10-CM | POA: Diagnosis not present

## 2016-02-04 DIAGNOSIS — R600 Localized edema: Secondary | ICD-10-CM

## 2016-02-04 DIAGNOSIS — M25661 Stiffness of right knee, not elsewhere classified: Secondary | ICD-10-CM

## 2016-02-04 DIAGNOSIS — M6281 Muscle weakness (generalized): Secondary | ICD-10-CM

## 2016-02-04 DIAGNOSIS — R2689 Other abnormalities of gait and mobility: Secondary | ICD-10-CM

## 2016-02-04 NOTE — Therapy (Signed)
[facility] [address] [place], [place], [address] Phone: [phone]   Fax:  [phone]  Physical Therapy Treatment  Patient Details  Name: Alyssa [name] MRN: [id number] Date of Birth: [birth year] Referring Provider: Rodell Perna, MD  Encounter Date: 02/04/2016      PT End of Session - 02/04/16 0853    Visit Number 8   Date for PT Re-Evaluation 03/08/16   PT Start Time 0845   PT Stop Time 0945   PT Time Calculation (min) 60 min   Activity Tolerance Patient tolerated treatment well   Behavior During Therapy [facility] for tasks assessed/performed      Past Medical History  Diagnosis Date  . Hyperlipidemia   . Thyroid disease   . Abdominal pain   . Ventral hernia   . Hypertension     PCP DR Nolon Rod  . Recurrent upper respiratory infection (URI)     FLU 08/23/11-08/29/11 then URI 08/29/11- 09/08/11- S/P zpack and steroids-  improved      no cough or congestion  . Hypothyroidism   . Hernia, incisional periumbilical, incarcerated 10/23/270  . Thyroid mass, left inferior lobe, 3.6cm [id number] 10/07/2011  . Pulmonary embolism ([place]) 2013  . Hot flashes   . Peripheral vascular disease ([place])     pulmonary embolis-still have some  . Cancer ([place])     breast  . Breast cancer ([place]) 06/2013    left upper inner  . Hx of radiation therapy 09/09/13- 10/13/13    left breast 5000 cGy in 25 sessions, no boost  . Post concussion syndrome     4 yrs ago- sees [facility]- Dr Leonie Man every 6 months-   has sleep study 2011 following accident but was neg per patient-  short term memory issues, dates and times  . Family history of adverse reaction to anesthesia     Patients mother had to be resusciated after having anesthesia  . Diabetes mellitus without complication ([place])     Takes Tradjenta  . GERD (gastroesophageal reflux disease)   . HKVQQVZD(638.7)     Takes Depakote    Past Surgical History  Procedure Laterality Date  .  Appendectomy  2002    lap appy.  Dr. Hassell Done  . Carpal tunnel release  10/2009-12/2010    left - right  . Tarsal tunnel release Bilateral   . Cesarean section  X 2  . Ventral hernia repair  09/29/2011    Procedure: LAPAROSCOPIC VENTRAL HERNIA;  Surgeon: Adin Hector, MD;  [facility]: [facility];  Service: General;  Laterality: N/A;  Laparoscopic Ventral Wall Hernia Repair with Mesh  . Breast lumpectomy with needle localization and axillary sentinel lymph node bx Left 08/04/2013    Procedure: BREAST LUMPECTOMY WITH NEEDLE LOCALIZATION AND AXILLARY SENTINEL LYMPH NODE Biopsy x 3;  Surgeon: Rolm Bookbinder, MD;  Location: [employer];  Service: General;  Laterality: Left;  . Colonoscopy    . Hernia repair  09/15/11    Ventral w/mesh  . Thyroidectomy, partial    . Knee arthroplasty Right 12/22/2015    Procedure: COMPUTER ASSISTED TOTAL KNEE ARTHROPLASTY;  Surgeon: Marybelle Killings, MD;  Location: [place];  Service: Orthopedics;  Laterality: Right;  . Joint replacement    . Breast surgery    . Laparoscopic cholecystectomy  2006    Dr. Bubba Camp    There were no vitals filed for this visit.      Subjective Assessment - 02/04/16 0847    Subjective I  have lost 10 pounds!    Currently in Pain? Yes   Pain Score 5    Pain Location Knee   Pain Orientation Right   Pain Descriptors / Indicators Aching   Multiple Pain Sites No            [facility] PT Assessment - 02/04/16 0001    AROM   Right Knee Extension 10   Right Knee Flexion 81   PROM   Right Knee Extension 8   Right Knee Flexion 83                     [facility] Adult PT Treatment/Exercise - 02/04/16 0001    Knee/Hip Exercises: Stretches   Other Knee/Hip Stretches stair stretch flex: rocking initially x 2 min, then hold 20 sec 10x, repeat for ext   Knee/Hip Exercises: Aerobic   Nustep L1 x 10 with seat moved closer   Knee/Hip Exercises: Standing   Wall Squat 2 sets;10 reps  Ball behind the back   Knee/Hip Exercises: Seated   Long Arc  Quad AROM;Right;3 sets;10 reps   Knee/Hip Flexion Heel slides 3x10   Vasopneumatic   Number Minutes Vasopneumatic  15 minutes   Vasopnuematic Location  Knee   Vasopneumatic Pressure High   Vasopneumatic Temperature  3 flakes   Manual Therapy   Soft tissue mobilization Quads, scar massage, MFR peripatella, Static stretching with distratction for flexion   Pt seated off hi- lo table                  PT Short Term Goals - 02/02/16 1022    PT SHORT TERM GOAL #2   Title demonstrate Rt knee AROM flexion to > or = to 90 degrees to improve sitting and negotiating steps without substitution   Time 4   Period Weeks   Status On-going  Slowly improving           PT Long Term Goals - 01/27/16 1011    PT LONG TERM GOAL #1   Title be independent in advanced HEP   Status On-going   PT LONG TERM GOAL #2   Title reduce FOTO to < or = to 43% limitation   Status On-going   PT LONG TERM GOAL #3   Title ascend steps with step-over-step with use of 1 rail   Status On-going   PT LONG TERM GOAL #4   Title demonstrate Rt knee AROM -5 to 100 degrees to improve mobility without substitution   Status On-going   PT LONG TERM GOAL #5   Title imrprove strength and endurance to walk for 45 minutes in the community without need for rest   Status On-going   PT LONG TERM GOAL #6   Title demonstrate symmetry with ambulation on level surface with use of cane   Status On-going               Plan - 02/04/16 0922    Rehab Potential Good   PT Frequency 3x / week   PT Duration 8 weeks   PT Treatment/Interventions ADLs/Self Care Home Management;Cryotherapy;Electrical Stimulation;Moist Heat;Therapeutic exercise;Therapeutic activities;Functional mobility training;Stair training;Gait training;Ultrasound;Balance training;Neuromuscular re-education;Patient/family education;Manual techniques;Vasopneumatic Device;Taping;Passive range of motion;Scar mobilization   PT Next Visit Plan Write MD note &  measure! Manual techniques/Knee ROM   Consulted and Agree with Plan of Care Patient      Patient will benefit from skilled therapeutic intervention in order to improve the following deficits and impairments:  Abnormal gait, Decreased range  of motion, Difficulty walking, Decreased endurance, Decreased activity tolerance, Pain, Increased edema, Decreased strength, Impaired flexibility  Visit Diagnosis: Pain in right knee  Stiffness of right knee, not elsewhere classified  Muscle weakness (generalized)  Localized edema  Other abnormalities of gait and mobility     Problem List Patient Active Problem List   Diagnosis Date Noted  . Status post total right knee replacement 12/22/2015  . Hepatic steatosis 02/09/2015  . Hot flashes 09/09/2014  . Osteopenia 09/09/2014  . Breast cancer of upper-inner quadrant of left female breast ([place]) 07/23/2013  . Memory loss 07/14/2013  . Headache 07/14/2013  . Other malaise and fatigue 07/14/2013  . Acute pulmonary embolism ([place]) 10/07/2011  . Leukocytosis 10/07/2011  . Thyroid mass, left inferior lobe, 3.6cm [id number] 10/07/2011  . Hyponatremia 10/07/2011  . Obesity (BMI 30-39.9) 08/07/2011  . Post concussion syndrome     Grover Woodfield, PTA 02/04/2016, 9:34 AM  [facility] [address], [place], [place], [address] Phone: [phone]   Fax:  [phone]  Name: LAVINA RESOR MRN: [id number] Date of Birth: Dec 26, 1950

## 2016-02-07 ENCOUNTER — Encounter: Payer: Self-pay | Admitting: Physical Therapy

## 2016-02-07 ENCOUNTER — Ambulatory Visit: Payer: BC Managed Care – PPO | Admitting: Physical Therapy

## 2016-02-07 DIAGNOSIS — M25661 Stiffness of right knee, not elsewhere classified: Secondary | ICD-10-CM

## 2016-02-07 DIAGNOSIS — M6281 Muscle weakness (generalized): Secondary | ICD-10-CM

## 2016-02-07 DIAGNOSIS — R2689 Other abnormalities of gait and mobility: Secondary | ICD-10-CM

## 2016-02-07 DIAGNOSIS — M25561 Pain in right knee: Secondary | ICD-10-CM | POA: Diagnosis not present

## 2016-02-07 DIAGNOSIS — R600 Localized edema: Secondary | ICD-10-CM

## 2016-02-07 NOTE — Therapy (Signed)
[facility] [address] [place], [place], [address] Phone: [phone]   Fax:  [phone]  Physical Therapy Treatment  Patient Details  Name: Alyssa [name] MRN: [id number] Date of Birth: 1951-06-14 Referring Provider: Rodell Perna, MD  Encounter Date: 02/07/2016      PT End of Session - 02/07/16 1452    Visit Number 9   Date for PT Re-Evaluation 03/08/16   PT Start Time 1430   PT Stop Time 1545   PT Time Calculation (min) 75 min   Activity Tolerance Patient tolerated treatment well   Behavior During Therapy [facility] for tasks assessed/performed      Past Medical History  Diagnosis Date  . Hyperlipidemia   . Thyroid disease   . Abdominal pain   . Ventral hernia   . Hypertension     PCP DR Nolon Rod  . Recurrent upper respiratory infection (URI)     FLU 08/23/11-08/29/11 then URI 08/29/11- 09/08/11- S/P zpack and steroids-  improved      no cough or congestion  . Hypothyroidism   . Hernia, incisional periumbilical, incarcerated 01/24/5008  . Thyroid mass, left inferior lobe, 3.6cm [id number] 10/07/2011  . Pulmonary embolism ([place]) 2013  . Hot flashes   . Peripheral vascular disease ([place])     pulmonary embolis-still have some  . Cancer ([place])     breast  . Breast cancer ([place]) 06/2013    left upper inner  . Hx of radiation therapy 09/09/13- 10/13/13    left breast 5000 cGy in 25 sessions, no boost  . Post concussion syndrome     4 yrs ago- sees [facility]- Dr Leonie Man every 6 months-   has sleep study 2011 following accident but was neg per patient-  short term memory issues, dates and times  . Family history of adverse reaction to anesthesia     Patients mother had to be resusciated after having anesthesia  . Diabetes mellitus without complication ([place])     Takes Tradjenta  . GERD (gastroesophageal reflux disease)   . BZJIRCVE(938.1)     Takes Depakote    Past Surgical History  Procedure Laterality Date  .  Appendectomy  2002    lap appy.  Dr. Hassell Done  . Carpal tunnel release  10/2009-12/2010    left - right  . Tarsal tunnel release Bilateral   . Cesarean section  X 2  . Ventral hernia repair  09/29/2011    Procedure: LAPAROSCOPIC VENTRAL HERNIA;  Surgeon: Adin Hector, MD;  [facility];  Service: General;  Laterality: N/A;  Laparoscopic Ventral Wall Hernia Repair with Mesh  . Breast lumpectomy with needle localization and axillary sentinel lymph node bx Left 08/04/2013    Procedure: BREAST LUMPECTOMY WITH NEEDLE LOCALIZATION AND AXILLARY SENTINEL LYMPH NODE Biopsy x 3;  Surgeon: Rolm Bookbinder, MD;  Location: [place];  Service: General;  Laterality: Left;  . Colonoscopy    . Hernia repair  09/15/11    Ventral w/mesh  . Thyroidectomy, partial    . Knee arthroplasty Right 12/22/2015    Procedure: COMPUTER ASSISTED TOTAL KNEE ARTHROPLASTY;  Surgeon: Marybelle Killings, MD;  Location: [facility];  Service: Orthopedics;  Laterality: Right;  . Joint replacement    . Breast surgery    . Laparoscopic cholecystectomy  2006    Dr. Bubba Camp    There were no vitals filed for this visit.      Subjective Assessment - 02/07/16 1456    Subjective (p)  had agreat workout on Friday.    Currently in Pain? (p) Yes   Pain Score (p) 6    Pain Location (p) Knee   Pain Orientation (p) Right   Pain Descriptors / Indicators (p) Aching;Constant   Aggravating Factors  (p) Constant   Pain Relieving Factors (p) Rest, meds   Multiple Pain Sites (p) No            [facility] PT Assessment - 02/07/16 0001    Observation/Other Assessments   Focus on Therapeutic Outcomes (FOTO)  61% limited   AROM   Right Knee Extension 10   Right Knee Flexion 85   PROM   Right Knee Extension 4                     [facility] Adult PT Treatment/Exercise - 02/07/16 0001    Knee/Hip Exercises: Stretches   Other Knee/Hip Stretches stair stretch flex: rocking initially x 2 min, then hold 20 sec 10x, repeat for ext   Knee/Hip  Exercises: Aerobic   Nustep L1 x 10 with seat moved closer   Knee/Hip Exercises: Standing   Wall Squat 2 sets;10 reps  Ball behind the back   Knee/Hip Exercises: Seated   Long Arc Quad AROM;Right;3 sets;10 reps   Knee/Hip Flexion Heel slides 3x10   Vasopneumatic   Number Minutes Vasopneumatic  15 minutes   Vasopnuematic Location  Knee   Vasopneumatic Pressure High   Vasopneumatic Temperature  3 flakes   Manual Therapy   Soft tissue mobilization Quads, scar massage, MFR peripatella, Static stretching with distratction for flexion   Pt seated off hi- lo table                  PT Short Term Goals - 02/07/16 1525    PT SHORT TERM GOAL #2   Title demonstrate Rt knee AROM flexion to > or = to 90 degrees to improve sitting and negotiating steps without substitution   Time 4   Period Weeks   Status On-going  85 degrees   PT SHORT TERM GOAL #4   Title report < or = to 4/10 Rt knee pain with standing and walking   Time 4   Period Weeks           PT Long Term Goals - 02/07/16 1453    PT LONG TERM GOAL #1   Title be independent in advanced HEP   Time 8   Period Weeks   Status On-going   PT LONG TERM GOAL #2   Title reduce FOTO to < or = to 43% limitation   Time 8   Period Weeks   Status On-going  61%   PT LONG TERM GOAL #3   Title ascend steps with step-over-step with use of 1 rail   Time 8   Period Weeks   Status On-going  Reciprocal   PT LONG TERM GOAL #4   Title demonstrate Rt knee AROM -5 to 100 degrees to improve mobility without substitution   Time 8   Period Weeks   Status On-going   PT LONG TERM GOAL #5   Title imrprove strength and endurance to walk for 45 minutes in the community without need for rest   Time 8   Period Weeks   Status Partially Met  30 minutes   PT LONG TERM GOAL #6   Title demonstrate symmetry with ambulation on level surface with use of cane   Time 8   Period Weeks  Status On-going  Uses cane and has decreased knee swing  on RT               Plan - 02/07/16 1453    Clinical Impression Statement AROM in sitting today was 85 degrees of flexion, continues to improve her range each week. She can now cross her ankle up on her opposite knee as well as get in/out of the car easier. Her walking tolerance is now at 30 minutes.    Rehab Potential Good   PT Frequency 3x / week   PT Treatment/Interventions ADLs/Self Care Home Management;Cryotherapy;Electrical Stimulation;Moist Heat;Therapeutic exercise;Therapeutic activities;Functional mobility training;Stair training;Gait training;Ultrasound;Balance training;Neuromuscular re-education;Patient/family education;Manual techniques;Vasopneumatic Device;Taping;Passive range of motion;Scar mobilization   PT Next Visit Plan See what MD says   Consulted and Agree with Plan of Care Patient      Patient will benefit from skilled therapeutic intervention in order to improve the following deficits and impairments:  Abnormal gait, Decreased range of motion, Difficulty walking, Decreased endurance, Decreased activity tolerance, Pain, Increased edema, Decreased strength, Impaired flexibility  Visit Diagnosis: Pain in right knee  Stiffness of right knee, not elsewhere classified  Muscle weakness (generalized)  Localized edema  Other abnormalities of gait and mobility     Problem List Patient Active Problem List   Diagnosis Date Noted  . Status post total right knee replacement 12/22/2015  . Hepatic steatosis 02/09/2015  . Hot flashes 09/09/2014  . Osteopenia 09/09/2014  . Breast cancer of upper-inner quadrant of left female breast ([place]) 07/23/2013  . Memory loss 07/14/2013  . Headache 07/14/2013  . Other malaise and fatigue 07/14/2013  . Acute pulmonary embolism ([place]) 10/07/2011  . Leukocytosis 10/07/2011  . Thyroid mass, left inferior lobe, 3.6cm [id number] 10/07/2011  . Hyponatremia 10/07/2011  . Obesity (BMI 30-39.9) 08/07/2011  . Post concussion syndrome      Trey Gulbranson, PTA 02/07/2016, 3:35 PM  [facility] [address], [place], [place], [address] Phone: [phone]   Fax:  [phone]  Name: Alyssa [name] MRN: [id number] Date of Birth: 03-11-1951

## 2016-02-09 ENCOUNTER — Ambulatory Visit: Payer: BC Managed Care – PPO | Admitting: Physical Therapy

## 2016-02-09 ENCOUNTER — Encounter: Payer: Self-pay | Admitting: Physical Therapy

## 2016-02-09 DIAGNOSIS — M25561 Pain in right knee: Secondary | ICD-10-CM | POA: Diagnosis not present

## 2016-02-09 DIAGNOSIS — M25661 Stiffness of right knee, not elsewhere classified: Secondary | ICD-10-CM

## 2016-02-09 DIAGNOSIS — R2689 Other abnormalities of gait and mobility: Secondary | ICD-10-CM

## 2016-02-09 DIAGNOSIS — R600 Localized edema: Secondary | ICD-10-CM

## 2016-02-09 DIAGNOSIS — M6281 Muscle weakness (generalized): Secondary | ICD-10-CM

## 2016-02-09 NOTE — Therapy (Signed)
[facility] [address] [place], [place], [address] Phone: [phone]   Fax:  [phone]  Physical Therapy Treatment  Patient Details  Name: REMMI ARMENTEROS MRN: [id number] Date of Birth: 08/13/1950 Referring Provider: Rodell Perna, MD  Encounter Date: 02/09/2016      PT End of Session - 02/09/16 1518    Visit Number 10   Date for PT Re-Evaluation 03/08/16   PT Start Time 6440   PT Stop Time 1615   PT Time Calculation (min) 60 min   Activity Tolerance Patient tolerated treatment well   Behavior During Therapy [facility] for tasks assessed/performed      Past Medical History  Diagnosis Date  . Hyperlipidemia   . Thyroid disease   . Abdominal pain   . Ventral hernia   . Hypertension     PCP DR Nolon Rod  . Recurrent upper respiratory infection (URI)     FLU 08/23/11-08/29/11 then URI 08/29/11- 09/08/11- S/P zpack and steroids-  improved      no cough or congestion  . Hypothyroidism   . Hernia, incisional periumbilical, incarcerated 3/47/4259  . Thyroid mass, left inferior lobe, 3.6cm [id number] 10/07/2011  . Pulmonary embolism ([place]) 2013  . Hot flashes   . Peripheral vascular disease ([place])     pulmonary embolis-still have some  . Cancer ([place])     breast  . Breast cancer ([place]) 06/2013    left upper inner  . Hx of radiation therapy 09/09/13- 10/13/13    left breast 5000 cGy in 25 sessions, no boost  . Post concussion syndrome     4 yrs ago- sees [facility]- Dr Leonie Man every 6 months-   has sleep study 2011 following accident but was neg per patient-  short term memory issues, dates and times  . Family history of adverse reaction to anesthesia     Patients mother had to be resusciated after having anesthesia  . Diabetes mellitus without complication ([place])     Takes Tradjenta  . GERD (gastroesophageal reflux disease)   . EPPIRJJO(841.6)     Takes Depakote    Past Surgical History  Procedure Laterality Date  .  Appendectomy  2002    lap appy.  Dr. Hassell Done  . Carpal tunnel release  10/2009-12/2010    left - right  . Tarsal tunnel release Bilateral   . Cesarean section  X 2  . Ventral hernia repair  09/29/2011    Procedure: LAPAROSCOPIC VENTRAL HERNIA;  Surgeon: Adin Hector, MD;  [facility];  Service: General;  Laterality: N/A;  Laparoscopic Ventral Wall Hernia Repair with Mesh  . Breast lumpectomy with needle localization and axillary sentinel lymph node bx Left 08/04/2013    Procedure: BREAST LUMPECTOMY WITH NEEDLE LOCALIZATION AND AXILLARY SENTINEL LYMPH NODE Biopsy x 3;  Surgeon: Rolm Bookbinder, MD;  Location: [facility];  Service: General;  Laterality: Left;  . Colonoscopy    . Hernia repair  09/15/11    Ventral w/mesh  . Thyroidectomy, partial    . Knee arthroplasty Right 12/22/2015    Procedure: COMPUTER ASSISTED TOTAL KNEE ARTHROPLASTY;  Surgeon: Marybelle Killings, MD;  Location: [facility];  Service: Orthopedics;  Laterality: Right;  . Joint replacement    . Breast surgery    . Laparoscopic cholecystectomy  2006    Dr. Bubba Camp    There were no vitals filed for this visit.      Subjective Assessment - 02/09/16 1517    Subjective Saw  PA this AM. Very pleased with her progress. Lowered pain med dosage, keep working on the motion,   Currently in Pain? No/denies   Multiple Pain Sites No            [facility] PT Assessment - 02/09/16 0001    AROM   Right Knee Flexion 83  Sitting                     [facility] Adult PT Treatment/Exercise - 02/09/16 0001    Knee/Hip Exercises: Stretches   Other Knee/Hip Stretches stair stretch flex: rocking initially x 2 min, then hold 20 sec 10x, repeat for ext   Knee/Hip Exercises: Aerobic   Nustep L2 x 6 min  Pt had a moment where her knee went to almost 90 degrees    Knee/Hip Exercises: Standing   Forward Step Up Right;1 set;5 reps;Hand Hold: 2;Step Height: 6"  Painful after the bike   Knee/Hip Exercises: Seated   Hamstring Curl  AROM;Strengthening;Right;2 sets;10 reps  yellow band   Sit to Sand --  Standing at hi lo table: sit to chair 2x10   Acupuncturist Stimulation Location knee   Electrical Stimulation Action IFC   Electrical Stimulation Parameters 1-10 HZ   Electrical Stimulation Goals Edema;Pain   Vasopneumatic   Number Minutes Vasopneumatic  15 minutes   Vasopnuematic Location  Knee   Vasopneumatic Pressure High   Vasopneumatic Temperature  3 flakes   Manual Therapy   Soft tissue mobilization Quads, scar massage, MFR peripatella, Static stretching with distratction for flexion   Pt seated off hi- lo table                  PT Short Term Goals - 02/07/16 1525    PT SHORT TERM GOAL #2   Title demonstrate Rt knee AROM flexion to > or = to 90 degrees to improve sitting and negotiating steps without substitution   Time 4   Period Weeks   Status On-going  85 degrees   PT SHORT TERM GOAL #4   Title report < or = to 4/10 Rt knee pain with standing and walking   Time 4   Period Weeks           PT Long Term Goals - 02/07/16 1453    PT LONG TERM GOAL #1   Title be independent in advanced HEP   Time 8   Period Weeks   Status On-going   PT LONG TERM GOAL #2   Title reduce FOTO to < or = to 43% limitation   Time 8   Period Weeks   Status On-going  61%   PT LONG TERM GOAL #3   Title ascend steps with step-over-step with use of 1 rail   Time 8   Period Weeks   Status On-going  Reciprocal   PT LONG TERM GOAL #4   Title demonstrate Rt knee AROM -5 to 100 degrees to improve mobility without substitution   Time 8   Period Weeks   Status On-going   PT LONG TERM GOAL #5   Title imrprove strength and endurance to walk for 45 minutes in the community without need for rest   Time 8   Period Weeks   Status Partially Met  30 minutes   PT LONG TERM GOAL #6   Title demonstrate symmetry with ambulation on level surface with use of cane   Time 8   Period Weeks    Status  On-going  Uses cane and has decreased knee swing on RT               Plan - 02/09/16 1518    Clinical Impression Statement Pt had excellent MD visit. Upon riding bike she went "too far", probably close to 90 degrees, and most likely broke up some scar tissue. She was in a lot of pain, heard a pop, and stretching the knee was more painful than it had been.    Rehab Potential Good   PT Frequency 3x / week   PT Treatment/Interventions ADLs/Self Care Home Management;Cryotherapy;Electrical Stimulation;Moist Heat;Therapeutic exercise;Therapeutic activities;Functional mobility training;Stair training;Gait training;Ultrasound;Balance training;Neuromuscular re-education;Patient/family education;Manual techniques;Vasopneumatic Device;Taping;Passive range of motion;Scar mobilization   PT Next Visit Plan Knee ROM   Consulted and Agree with Plan of Care Patient      Patient will benefit from skilled therapeutic intervention in order to improve the following deficits and impairments:  Abnormal gait, Decreased range of motion, Decreased endurance, Decreased activity tolerance, Pain, Increased edema, Decreased strength, Impaired flexibility  Visit Diagnosis: Pain in right knee  Stiffness of right knee, not elsewhere classified  Muscle weakness (generalized)  Localized edema  Other abnormalities of gait and mobility     Problem List Patient Active Problem List   Diagnosis Date Noted  . Status post total right knee replacement 12/22/2015  . Hepatic steatosis 02/09/2015  . Hot flashes 09/09/2014  . Osteopenia 09/09/2014  . Breast cancer of upper-inner quadrant of left female breast ([place]) 07/23/2013  . Memory loss 07/14/2013  . Headache 07/14/2013  . Other malaise and fatigue 07/14/2013  . Acute pulmonary embolism ([place]) 10/07/2011  . Leukocytosis 10/07/2011  . Thyroid mass, left inferior lobe, 3.6cm [id number] 10/07/2011  . Hyponatremia 10/07/2011  . Obesity (BMI 30-39.9)  08/07/2011  . Post concussion syndrome     Harris Kistler, PTA 02/09/2016, 4:05 PM  [facility] [address], [place], [place], [address] Phone: [phone]   Fax:  [phone]  Name: KWEEN BACORN MRN: [id number] Date of Birth: 08-10-1950

## 2016-02-11 ENCOUNTER — Ambulatory Visit: Payer: BC Managed Care – PPO | Admitting: Physical Therapy

## 2016-02-11 DIAGNOSIS — M25561 Pain in right knee: Secondary | ICD-10-CM

## 2016-02-11 DIAGNOSIS — R600 Localized edema: Secondary | ICD-10-CM

## 2016-02-11 DIAGNOSIS — R2689 Other abnormalities of gait and mobility: Secondary | ICD-10-CM

## 2016-02-11 DIAGNOSIS — M6281 Muscle weakness (generalized): Secondary | ICD-10-CM

## 2016-02-11 DIAGNOSIS — M25661 Stiffness of right knee, not elsewhere classified: Secondary | ICD-10-CM

## 2016-02-11 NOTE — Therapy (Signed)
[facility] [address], [place], [place], [address] Phone: [phone]   Fax:  [phone]  Physical Therapy Treatment  Patient Details  Name: Alyssa [name] MRN: [id number] Date of Birth: 08-07-1950 Referring Provider: Rodell Perna, MD  Encounter Date: 02/11/2016      PT End of Session - 02/11/16 1016    Visit Number 11   Date for PT Re-Evaluation 03/08/16   PT Start Time 0920   PT Stop Time 1030   PT Time Calculation (min) 70 min   Activity Tolerance Patient tolerated treatment well;Patient limited by pain      Past Medical History  Diagnosis Date  . Hyperlipidemia   . Thyroid disease   . Abdominal pain   . Ventral hernia   . Hypertension     PCP DR Nolon Rod  . Recurrent upper respiratory infection (URI)     FLU 08/23/11-08/29/11 then URI 08/29/11- 09/08/11- S/P zpack and steroids-  improved      no cough or congestion  . Hypothyroidism   . Hernia, incisional periumbilical, incarcerated 9/62/8366  . Thyroid mass, left inferior lobe, 3.6cm [id number] 10/07/2011  . Pulmonary embolism ([place]) 2013  . Hot flashes   . Peripheral vascular disease ([place])     pulmonary embolis-still have some  . Cancer ([place])     breast  . Breast cancer ([place]) 06/2013    left upper inner  . Hx of radiation therapy 09/09/13- 10/13/13    left breast 5000 cGy in 25 sessions, no boost  . Post concussion syndrome     4 yrs ago- sees [facility]- Dr Leonie Man every 6 months-   has sleep study 2011 following accident but was neg per patient-  short term memory issues, dates and times  . Family history of adverse reaction to anesthesia     Patients mother had to be resusciated after having anesthesia  . Diabetes mellitus without complication ([place])     Takes Tradjenta  . GERD (gastroesophageal reflux disease)   . [id number](681.2)     Takes Depakote    Past Surgical History  Procedure Laterality Date  . Appendectomy  2002    lap appy.  Dr.  Hassell Done  . Carpal tunnel release  10/2009-12/2010    left - right  . Tarsal tunnel release Bilateral   . Cesarean section  X 2  . Ventral hernia repair  09/29/2011    Procedure: LAPAROSCOPIC VENTRAL HERNIA;  Surgeon: Adin Hector, MD;  Location: [facility];  Service: General;  Laterality: N/A;  Laparoscopic Ventral Wall Hernia Repair with Mesh  . Breast lumpectomy with needle localization and axillary sentinel lymph node bx Left 08/04/2013    Procedure: BREAST LUMPECTOMY WITH NEEDLE LOCALIZATION AND AXILLARY SENTINEL LYMPH NODE Biopsy x 3;  Surgeon: Rolm Bookbinder, MD;  Location: [place];  Service: General;  Laterality: Left;  . Colonoscopy    . Hernia repair  09/15/11    Ventral w/mesh  . Thyroidectomy, partial    . Knee arthroplasty Right 12/22/2015    Procedure: COMPUTER ASSISTED TOTAL KNEE ARTHROPLASTY;  Surgeon: Marybelle Killings, MD;  Location: [place];  Service: Orthopedics;  Laterality: Right;  . Joint replacement    . Breast surgery    . Laparoscopic cholecystectomy  2006    Dr. Bubba Camp    There were no vitals filed for this visit.      Subjective Assessment - 02/11/16 0933    Subjective States she has lots of stiffness and  soreness this morning.  Reports she went too far on the bike last time and was sore after that.  The PA says I am doing well.  I don't feel like I can drive yet.  If I'm not at 90 degrees at 10-12 weeks, he would manipulate my knee.     Currently in Pain? Yes   Pain Score 5    Pain Location Knee   Pain Orientation Right   Pain Type Surgical pain                         [facility] Adult PT Treatment/Exercise - 02/11/16 0001    Knee/Hip Exercises: Stretches   Other Knee/Hip Stretches stair stretch flex: rocking initially x 2 min, then hold 20 sec 10x, repeat for ext   Knee/Hip Exercises: Aerobic   Recumbent Bike 5 min rocking back and forth   Nustep L2 x 6 min  Pt had a moment where her knee went to almost 90 degrees    Knee/Hip Exercises: Standing    Forward Step Up Right;1 set;Hand Hold: 1;Step Height: 4";15 reps   Other Standing Knee Exercises retro step 10x   Other Standing Knee Exercises UE wall slides with gluteal squeeze   Knee/Hip Exercises: Seated   Long Arc Quad Strengthening;Right;1 set;10 reps;Weights   Long Arc Quad Weight 2 lbs.   Hamstring Curl AROM;Strengthening;Right;2 sets;10 reps  red band   Sit to Sand 1 set;10 reps;without UE support  high mat table   Knee/Hip Exercises: Supine   Other Supine Knee/Hip Exercises green ball knee flexion 2x10;  HS sets on ball 10x   Vasopneumatic   Number Minutes Vasopneumatic  15 minutes   Vasopnuematic Location  Knee   Vasopneumatic Pressure High   Vasopneumatic Temperature  3 flakes   Manual Therapy   Joint Mobilization grade 3 distraction with knee flexion overpressure   supine knee flexion mob grade 2/3 12x   Soft tissue mobilization Graston instrument assisted to quads G 4   Muscle Energy Technique HS contract relax                   PT Short Term Goals - 02/11/16 1242    PT SHORT TERM GOAL #1   Title be independent in initial HEP   Status Achieved   PT SHORT TERM GOAL #2   Title demonstrate Rt knee AROM flexion to > or = to 90 degrees to improve sitting and negotiating steps without substitution   Time 4   Period Weeks   Status On-going   PT SHORT TERM GOAL #3   Title wean from walker to use of cane for all distances   Status Achieved   PT SHORT TERM GOAL #4   Title report < or = to 4/10 Rt knee pain with standing and walking   Time 4   Period Weeks   Status On-going           PT Long Term Goals - 02/11/16 1243    PT LONG TERM GOAL #1   Title be independent in advanced HEP   Time 8   Period Weeks   Status On-going   PT LONG TERM GOAL #2   Title reduce FOTO to < or = to 43% limitation   Period Weeks   Status On-going   PT LONG TERM GOAL #3   Title ascend steps with step-over-step with use of 1 rail   Time 8   Period Weeks  Status  On-going   PT LONG TERM GOAL #4   Title demonstrate Rt knee AROM -5 to 100 degrees to improve mobility without substitution   Time 8   Status On-going   PT LONG TERM GOAL #5   Title imrprove strength and endurance to walk for 45 minutes in the community without need for rest   Time 8   Period Weeks   Status Partially Met   PT LONG TERM GOAL #6   Title demonstrate symmetry with ambulation on level surface with use of cane   Time 8   Period Weeks   Status On-going               Plan - 02/11/16 1028    Clinical Impression Statement The patient reports she is still sore and stiff upon arrival this morning secondary to the big stretch on the bike last visit.  Mild knee edema.  She is able to resume quad activation exercises and low level ROM without pain exacerbation with patient reporting she feels better after treatment session.   Therapist closely monitoring response with all treatment interventions.     PT Next Visit Plan Nu-Step and bike for ROM;  knee flexion and extension ROM;  quad activation;  manual techniques and vasocompression      Patient will benefit from skilled therapeutic intervention in order to improve the following deficits and impairments:     Visit Diagnosis: Pain in right knee  Stiffness of right knee, not elsewhere classified  Muscle weakness (generalized)  Localized edema  Other abnormalities of gait and mobility     Problem List Patient Active Problem List   Diagnosis Date Noted  . Status post total right knee replacement 12/22/2015  . Hepatic steatosis 02/09/2015  . Hot flashes 09/09/2014  . Osteopenia 09/09/2014  . Breast cancer of upper-inner quadrant of left female breast ([place]) 07/23/2013  . Memory loss 07/14/2013  . Headache 07/14/2013  . Other malaise and fatigue 07/14/2013  . Acute pulmonary embolism ([place]) 10/07/2011  . Leukocytosis 10/07/2011  . Thyroid mass, left inferior lobe, 3.6cm [id number] 10/07/2011  . Hyponatremia  10/07/2011  . Obesity (BMI 30-39.9) 08/07/2011  . Post concussion syndrome    Ruben Im, PT 02/11/2016 12:45 PM Phone: [phone] Fax: [phone] Alvera Singh 02/11/2016, 12:44 PM  [facility] [address] [place], [place], [address] Phone: [phone]   Fax:  [phone]  Name: MCKAY BRANDT MRN: [id number] Date of Birth: [birth year]

## 2016-02-14 ENCOUNTER — Encounter: Payer: Self-pay | Admitting: Physical Therapy

## 2016-02-14 ENCOUNTER — Ambulatory Visit: Payer: BC Managed Care – PPO | Admitting: Physical Therapy

## 2016-02-14 DIAGNOSIS — M25561 Pain in right knee: Secondary | ICD-10-CM | POA: Diagnosis not present

## 2016-02-14 DIAGNOSIS — M25661 Stiffness of right knee, not elsewhere classified: Secondary | ICD-10-CM

## 2016-02-14 DIAGNOSIS — R2689 Other abnormalities of gait and mobility: Secondary | ICD-10-CM

## 2016-02-14 DIAGNOSIS — M6281 Muscle weakness (generalized): Secondary | ICD-10-CM

## 2016-02-14 DIAGNOSIS — R600 Localized edema: Secondary | ICD-10-CM

## 2016-02-14 NOTE — Therapy (Signed)
[facility] [address] [place], [place], [address] Phone: [phone]   Fax:  [phone]  Physical Therapy Treatment  Patient Details  Name: Alyssa [name] MRN: [id number] Date of Birth: [birth year] Referring Provider: Rodell Perna, MD  Encounter Date: 02/14/2016      PT End of Session - 02/14/16 0946    Visit Number 12   Date for PT Re-Evaluation 03/08/16   PT Start Time 0925   PT Stop Time 1030   PT Time Calculation (min) 65 min   Activity Tolerance Patient tolerated treatment well;Patient limited by pain   Behavior During Therapy [facility] for tasks assessed/performed      Past Medical History:  Diagnosis Date  . Abdominal pain   . Breast cancer ([place]) 06/2013   left upper inner  . Cancer ([place])    breast  . Diabetes mellitus without complication ([place])    Takes Tradjenta  . Family history of adverse reaction to anesthesia    Patients mother had to be resusciated after having anesthesia  . GERD (gastroesophageal reflux disease)   . Headache(784.0)    Takes Depakote  . Hernia, incisional periumbilical, incarcerated 2/63/7858  . Hot flashes   . Hx of radiation therapy 09/09/13- 10/13/13   left breast 5000 cGy in 25 sessions, no boost  . Hyperlipidemia   . Hypertension    PCP DR Nolon Rod  . Hypothyroidism   . Peripheral vascular disease ([place])    pulmonary embolis-still have some  . Post concussion syndrome    4 yrs ago- sees [facility]- Dr Leonie Man every 6 months-   has sleep study 2011 following accident but was neg per patient-  short term memory issues, dates and times  . Pulmonary embolism ([place]) 2013  . Recurrent upper respiratory infection (URI)    FLU 08/23/11-08/29/11 then URI 08/29/11- 09/08/11- S/P zpack and steroids-  improved      no cough or congestion  . Thyroid disease   . Thyroid mass, left inferior lobe, 3.6cm [id number] 10/07/2011  . Ventral hernia     Past Surgical History:  Procedure  Laterality Date  . APPENDECTOMY  2002   lap appy.  Dr. Hassell Done  . BREAST LUMPECTOMY WITH NEEDLE LOCALIZATION AND AXILLARY SENTINEL LYMPH NODE BX Left 08/04/2013   Procedure: BREAST LUMPECTOMY WITH NEEDLE LOCALIZATION AND AXILLARY SENTINEL LYMPH NODE Biopsy x 3;  Surgeon: Rolm Bookbinder, MD;  Location: [facility];  Service: General;  Laterality: Left;  . BREAST SURGERY    . CARPAL TUNNEL RELEASE  10/2009-12/2010   left - right  . CESAREAN SECTION  X 2  . COLONOSCOPY    . HERNIA REPAIR  09/15/11   Ventral w/mesh  . JOINT REPLACEMENT    . KNEE ARTHROPLASTY Right 12/22/2015   Procedure: COMPUTER ASSISTED TOTAL KNEE ARTHROPLASTY;  Surgeon: Marybelle Killings, MD;  Location: [place];  Service: Orthopedics;  Laterality: Right;  . LAPAROSCOPIC CHOLECYSTECTOMY  2006   Dr. Bubba Camp  . TARSAL TUNNEL RELEASE Bilateral   . THYROIDECTOMY, PARTIAL    . VENTRAL HERNIA REPAIR  09/29/2011   Procedure: LAPAROSCOPIC VENTRAL HERNIA;  Surgeon: Adin Hector, MD;  [facility];  Service: General;  Laterality: N/A;  Laparoscopic Ventral Wall Hernia Repair with Mesh    There were no vitals filed for this visit.      Subjective Assessment - 02/14/16 0945    Subjective worked on my knee a lot this weekend and I'm really sore below my knee.  Currently in Pain? Yes   Pain Score 6    Pain Location Knee   Pain Orientation Right;Distal   Pain Descriptors / Indicators Sore   Aggravating Factors  Stretching   Pain Relieving Factors Ice   Multiple Pain Sites No            [facility] PT Assessment - 02/14/16 0001      PROM   Right Knee Flexion 80                     [facility] Adult PT Treatment/Exercise - 02/14/16 0001      Knee/Hip Exercises: Stretches   Other Knee/Hip Stretches Ankle rocker board x 3 min     Knee/Hip Exercises: Aerobic   Recumbent Bike Rocking x 6 min   Nustep L2 x 6 min     Vasopneumatic   Number Minutes Vasopneumatic  15 minutes   Vasopnuematic Location  Knee   Vasopneumatic  Pressure High   Vasopneumatic Temperature  3 flakes     Manual Therapy   Soft tissue mobilization Knee, quad, scar, static stretching for flexion                  PT Short Term Goals - 02/14/16 1016      PT SHORT TERM GOAL #2   Title demonstrate Rt knee AROM flexion to > or = to 90 degrees to improve sitting and negotiating steps without substitution   Time 4   Period Weeks   Status On-going  80 degrees passive           PT Long Term Goals - 02/11/16 1243      PT LONG TERM GOAL #1   Title be independent in advanced HEP   Time 8   Period Weeks   Status On-going     PT LONG TERM GOAL #2   Title reduce FOTO to < or = to 43% limitation   Period Weeks   Status On-going     PT LONG TERM GOAL #3   Title ascend steps with step-over-step with use of 1 rail   Time 8   Period Weeks   Status On-going     PT LONG TERM GOAL #4   Title demonstrate Rt knee AROM -5 to 100 degrees to improve mobility without substitution   Time 8   Status On-going     PT LONG TERM GOAL #5   Title imrprove strength and endurance to walk for 45 minutes in the community without need for rest   Time 8   Period Weeks   Status Partially Met     PT LONG TERM GOAL #6   Title demonstrate symmetry with ambulation on level surface with use of cane   Time 8   Period Weeks   Status On-going               Plan - 02/14/16 0946    Clinical Impression Statement Pretty sore today. Advised pt to ease up on her intensity with her stretches at home but keep the frequency. PROM in sitting lingering around 80 degrees.    Rehab Potential Good   PT Frequency 3x / week   PT Duration 8 weeks   PT Treatment/Interventions ADLs/Self Care Home Management;Cryotherapy;Electrical Stimulation;Moist Heat;Therapeutic exercise;Therapeutic activities;Functional mobility training;Stair training;Gait training;Ultrasound;Balance training;Neuromuscular re-education;Patient/family education;Manual  techniques;Vasopneumatic Device;Taping;Passive range of motion;Scar mobilization   PT Next Visit Plan Nu-Step and bike for ROM;  knee flexion and extension ROM;  quad activation;  manual techniques and vasocompression      Patient will benefit from skilled therapeutic intervention in order to improve the following deficits and impairments:  Abnormal gait, Decreased range of motion, Decreased endurance, Decreased activity tolerance, Pain, Increased edema, Decreased strength, Impaired flexibility  Visit Diagnosis: Pain in right knee  Stiffness of right knee, not elsewhere classified  Muscle weakness (generalized)  Localized edema  Other abnormalities of gait and mobility     Problem List Patient Active Problem List   Diagnosis Date Noted  . Status post total right knee replacement 12/22/2015  . Hepatic steatosis 02/09/2015  . Hot flashes 09/09/2014  . Osteopenia 09/09/2014  . Breast cancer of upper-inner quadrant of left female breast ([place]) 07/23/2013  . Memory loss 07/14/2013  . Headache 07/14/2013  . Other malaise and fatigue 07/14/2013  . Acute pulmonary embolism ([place]) 10/07/2011  . Leukocytosis 10/07/2011  . 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 Hyponatremia 10/07/2011  . Obesity (BMI 30-39.9) 08/07/2011  . Post concussion syndrome     Bergen Magner 02/14/2016, 10:18 AM  [facility] [address], [place], [place], [address] Phone: [phone]   Fax:  [phone]  Name: ELNORIA LIVINGSTON MRN: [id number] Date of Birth: 07-31-1950

## 2016-02-14 NOTE — Therapy (Signed)
[facility] [address], [place], [place], [address] Phone: [phone]   Fax:  [phone]  Physical Therapy Treatment  Patient Details  Name: Alyssa [name] MRN: [id number] Date of Birth: Feb 26, 1951 Referring Provider: Rodell Perna, MD  Encounter Date: 02/14/2016      PT End of Session - 02/14/16 0946    Visit Number 12   Date for PT Re-Evaluation 03/08/16   PT Start Time 0925   PT Stop Time 1030   PT Time Calculation (min) 65 min   Activity Tolerance Patient tolerated treatment well;Patient limited by pain   Behavior During Therapy [facility] for tasks assessed/performed      Past Medical History:  Diagnosis Date  . Abdominal pain   . Breast cancer ([place]) 06/2013   left upper inner  . Cancer ([place])    breast  . Diabetes mellitus without complication ([place])    Takes Tradjenta  . Family history of adverse reaction to anesthesia    Patients mother had to be resusciated after having anesthesia  . GERD (gastroesophageal reflux disease)   . Headache(784.0)    Takes Depakote  . Hernia, incisional periumbilical, incarcerated 0/96/2836  . Hot flashes   . Hx of radiation therapy 09/09/13- 10/13/13   left breast 5000 cGy in 25 sessions, no boost  . Hyperlipidemia   . Hypertension    PCP DR Nolon Rod  . Hypothyroidism   . Peripheral vascular disease ([place])    pulmonary embolis-still have some  . Post concussion syndrome    4 yrs ago- sees [facility]- Dr Leonie Man every 6 months-   has sleep study 2011 following accident but was neg per patient-  short term memory issues, dates and times  . Pulmonary embolism ([place]) 2013  . Recurrent upper respiratory infection (URI)    FLU 08/23/11-08/29/11 then URI 08/29/11- 09/08/11- S/P zpack and steroids-  improved      no cough or congestion  . Thyroid disease   . Thyroid mass, left inferior lobe, 3.6cm [id number] 10/07/2011  . Ventral hernia     Past Surgical History:  Procedure  Laterality Date  . APPENDECTOMY  2002   lap appy.  Dr. Hassell Done  . BREAST LUMPECTOMY WITH NEEDLE LOCALIZATION AND AXILLARY SENTINEL LYMPH NODE BX Left 08/04/2013   Procedure: BREAST LUMPECTOMY WITH NEEDLE LOCALIZATION AND AXILLARY SENTINEL LYMPH NODE Biopsy x 3;  Surgeon: Rolm Bookbinder, MD;  Location: [facility];  Service: General;  Laterality: Left;  . BREAST SURGERY    . CARPAL TUNNEL RELEASE  10/2009-12/2010   left - right  . CESAREAN SECTION  X 2  . COLONOSCOPY    . HERNIA REPAIR  09/15/11   Ventral w/mesh  . JOINT REPLACEMENT    . KNEE ARTHROPLASTY Right 12/22/2015   Procedure: COMPUTER ASSISTED TOTAL KNEE ARTHROPLASTY;  Surgeon: Marybelle Killings, MD;  Location: [place];  Service: Orthopedics;  Laterality: Right;  . LAPAROSCOPIC CHOLECYSTECTOMY  2006   Dr. Bubba Camp  . TARSAL TUNNEL RELEASE Bilateral   . THYROIDECTOMY, PARTIAL    . VENTRAL HERNIA REPAIR  09/29/2011   Procedure: LAPAROSCOPIC VENTRAL HERNIA;  Surgeon: Adin Hector, MD;  [facility];  Service: General;  Laterality: N/A;  Laparoscopic Ventral Wall Hernia Repair with Mesh    There were no vitals filed for this visit.      Subjective Assessment - 02/14/16 0945    Subjective worked on my knee a lot this weekend and I'm really sore below my knee.  Currently in Pain? Yes   Pain Score 6    Pain Location Knee   Pain Orientation Right;Distal   Pain Descriptors / Indicators Sore   Aggravating Factors  Stretching   Pain Relieving Factors Ice   Multiple Pain Sites No            [facility] PT Assessment - 02/14/16 0001      PROM   Right Knee Flexion 80                     [facility] Adult PT Treatment/Exercise - 02/14/16 0001      Knee/Hip Exercises: Stretches   Other Knee/Hip Stretches Ankle rocker board x 3 min     Knee/Hip Exercises: Aerobic   Recumbent Bike Rocking x 6 min   Nustep L2 x 6 min     Vasopneumatic   Number Minutes Vasopneumatic  15 minutes   Vasopnuematic Location  Knee   Vasopneumatic  Pressure High   Vasopneumatic Temperature  3 flakes     Manual Therapy   Soft tissue mobilization Knee, quad, scar, static stretching for flexion                  PT Short Term Goals - 02/14/16 1016      PT SHORT TERM GOAL #2   Title demonstrate Rt knee AROM flexion to > or = to 90 degrees to improve sitting and negotiating steps without substitution   Time 4   Period Weeks   Status On-going  80 degrees passive           PT Long Term Goals - 02/11/16 1243      PT LONG TERM GOAL #1   Title be independent in advanced HEP   Time 8   Period Weeks   Status On-going     PT LONG TERM GOAL #2   Title reduce FOTO to < or = to 43% limitation   Period Weeks   Status On-going     PT LONG TERM GOAL #3   Title ascend steps with step-over-step with use of 1 rail   Time 8   Period Weeks   Status On-going     PT LONG TERM GOAL #4   Title demonstrate Rt knee AROM -5 to 100 degrees to improve mobility without substitution   Time 8   Status On-going     PT LONG TERM GOAL #5   Title imrprove strength and endurance to walk for 45 minutes in the community without need for rest   Time 8   Period Weeks   Status Partially Met     PT LONG TERM GOAL #6   Title demonstrate symmetry with ambulation on level surface with use of cane   Time 8   Period Weeks   Status On-going               Plan - 02/14/16 0946    Clinical Impression Statement Pretty sore today. Advised pt to ease up on her intensity with her stretches at home but keep the frequency. PROM in sitting lingering around 80 degrees.    Rehab Potential Good   PT Frequency 3x / week   PT Duration 8 weeks   PT Treatment/Interventions ADLs/Self Care Home Management;Cryotherapy;Electrical Stimulation;Moist Heat;Therapeutic exercise;Therapeutic activities;Functional mobility training;Stair training;Gait training;Ultrasound;Balance training;Neuromuscular re-education;Patient/family education;Manual  techniques;Vasopneumatic Device;Taping;Passive range of motion;Scar mobilization   PT Next Visit Plan Nu-Step and bike for ROM;  knee flexion and extension ROM;  quad activation;  manual techniques and vasocompression      Patient will benefit from skilled therapeutic intervention in order to improve the following deficits and impairments:  Abnormal gait, Decreased range of motion, Decreased endurance, Decreased activity tolerance, Pain, Increased edema, Decreased strength, Impaired flexibility  Visit Diagnosis: Pain in right knee  Stiffness of right knee, not elsewhere classified  Muscle weakness (generalized)  Localized edema  Other abnormalities of gait and mobility     Problem List Patient Active Problem List   Diagnosis Date Noted  . Status post total right knee replacement 12/22/2015  . Hepatic steatosis 02/09/2015  . Hot flashes 09/09/2014  . Osteopenia 09/09/2014  . 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 Memory loss 07/14/2013  . Headache 07/14/2013  . Other malaise and fatigue 07/14/2013  . Acute pulmonary embolism ([place]) 10/07/2011  . Leukocytosis 10/07/2011  . Thyroid mass, left inferior lobe, 3.6cm [id number] 10/07/2011  . Hyponatremia 10/07/2011  . Obesity (BMI 30-39.9) 08/07/2011  . Post concussion syndrome     Dyani Babel, PTA 02/14/2016, 3:42 PM  [facility] [address] [place], [place], [address] Phone: [phone]   Fax:  [phone]  Name: Alyssa [name] MRN: [id number] Date of Birth: [birth year]

## 2016-02-16 ENCOUNTER — Ambulatory Visit: Payer: BC Managed Care – PPO | Admitting: Physical Therapy

## 2016-02-16 ENCOUNTER — Encounter: Payer: Self-pay | Admitting: Physical Therapy

## 2016-02-16 DIAGNOSIS — R600 Localized edema: Secondary | ICD-10-CM

## 2016-02-16 DIAGNOSIS — M6281 Muscle weakness (generalized): Secondary | ICD-10-CM

## 2016-02-16 DIAGNOSIS — M25561 Pain in right knee: Secondary | ICD-10-CM

## 2016-02-16 DIAGNOSIS — R2689 Other abnormalities of gait and mobility: Secondary | ICD-10-CM

## 2016-02-16 DIAGNOSIS — M25661 Stiffness of right knee, not elsewhere classified: Secondary | ICD-10-CM

## 2016-02-16 NOTE — Therapy (Signed)
[facility] [address] [place], [place], [address] Phone: [phone]   Fax:  [phone]  Physical Therapy Treatment  Patient Details  Name: Alyssa [name] MRN: [id number] Date of Birth: Oct 23, 1950 Referring Provider: Rodell Perna, MD  Encounter Date: 02/16/2016      PT End of Session - 02/16/16 0933    Visit Number 13   Date for PT Re-Evaluation 03/08/16   PT Start Time 0927   PT Stop Time 1030   PT Time Calculation (min) 63 min   Activity Tolerance Patient tolerated treatment well   Behavior During Therapy [facility] for tasks assessed/performed      Past Medical History:  Diagnosis Date  . Abdominal pain   . Breast cancer ([place]) 06/2013   left upper inner  . Cancer ([place])    breast  . Diabetes mellitus without complication ([place])    Takes Tradjenta  . Family history of adverse reaction to anesthesia    Patients mother had to be resusciated after having anesthesia  . GERD (gastroesophageal reflux disease)   . Headache(784.0)    Takes Depakote  . Hernia, incisional periumbilical, incarcerated 10/14/4008  . Hot flashes   . Hx of radiation therapy 09/09/13- 10/13/13   left breast 5000 cGy in 25 sessions, no boost  . Hyperlipidemia   . Hypertension    PCP DR Nolon Rod  . Hypothyroidism   . Peripheral vascular disease ([place])    pulmonary embolis-still have some  . Post concussion syndrome    4 yrs ago- sees [facility]- Dr Leonie Man every 6 months-   has sleep study 2011 following accident but was neg per patient-  short term memory issues, dates and times  . Pulmonary embolism ([place]) 2013  . Recurrent upper respiratory infection (URI)    FLU 08/23/11-08/29/11 then URI 08/29/11- 09/08/11- S/P zpack and steroids-  improved      no cough or congestion  . Thyroid disease   . Thyroid mass, left inferior lobe, 3.6cm [id number] 10/07/2011  . Ventral hernia     Past Surgical History:  Procedure Laterality Date  . APPENDECTOMY   2002   lap appy.  Dr. Hassell Done  . BREAST LUMPECTOMY WITH NEEDLE LOCALIZATION AND AXILLARY SENTINEL LYMPH NODE BX Left 08/04/2013   Procedure: BREAST LUMPECTOMY WITH NEEDLE LOCALIZATION AND AXILLARY SENTINEL LYMPH NODE Biopsy x 3;  Surgeon: Rolm Bookbinder, MD;  Location: [place];  Service: General;  Laterality: Left;  . BREAST SURGERY    . CARPAL TUNNEL RELEASE  10/2009-12/2010   left - right  . CESAREAN SECTION  X 2  . COLONOSCOPY    . HERNIA REPAIR  09/15/11   Ventral w/mesh  . JOINT REPLACEMENT    . KNEE ARTHROPLASTY Right 12/22/2015   Procedure: COMPUTER ASSISTED TOTAL KNEE ARTHROPLASTY;  Surgeon: Marybelle Killings, MD;  Location: [place];  Service: Orthopedics;  Laterality: Right;  . LAPAROSCOPIC CHOLECYSTECTOMY  2006   Dr. Bubba Camp  . TARSAL TUNNEL RELEASE Bilateral   . THYROIDECTOMY, PARTIAL    . VENTRAL HERNIA REPAIR  09/29/2011   Procedure: LAPAROSCOPIC VENTRAL HERNIA;  Surgeon: Adin Hector, MD;  Location: [facility];  Service: General;  Laterality: N/A;  Laparoscopic Ventral Wall Hernia Repair with Mesh    There were no vitals filed for this visit.      Subjective Assessment - 02/16/16 0931    Subjective Pain is back down prior to her self mobilization on the bike last week. She is driving more,  begin school/work this coming Monday. She was able to get a pedicure yesterday and move/bend her knee like she needed to.    Currently in Pain? Yes   Pain Score 2    Pain Location Knee   Pain Orientation Right   Pain Descriptors / Indicators Dull;Aching   Multiple Pain Sites No            [facility] PT Assessment - 02/16/16 0001      AROM   Right Knee Flexion 85  Sitting                     [facility] Adult PT Treatment/Exercise - 02/16/16 0001      Knee/Hip Exercises: Stretches   Other Knee/Hip Stretches Standing at second step: knee flexion/extension x 2 min     Knee/Hip Exercises: Aerobic   Recumbent Bike Rocking x 6 min   Nustep L2 x 7 min     Knee/Hip Exercises:  Standing   Wall Squat --  Squat from standing to chair, no UE 2x10     Knee/Hip Exercises: Seated   Long Arc Quad AROM;Strengthening;Right;3 sets;10 reps   Long Arc Quad Weight 2 lbs.   Long [employer] Limitations Put towel on floor so pt could heel slide after LAQ to increase her knee flexion     Vasopneumatic   Number Minutes Vasopneumatic  15 minutes   Vasopnuematic Location  Knee   Vasopneumatic Pressure High   Vasopneumatic Temperature  3 flakes     Manual Therapy   Soft tissue mobilization Knee, quad, scar, static stretching for flexion  Pt sitting off Hi Lo table                  PT Short Term Goals - 02/14/16 1016      PT SHORT TERM GOAL #2   Title demonstrate Rt knee AROM flexion to > or = to 90 degrees to improve sitting and negotiating steps without substitution   Time 4   Period Weeks   Status On-going  80 degrees passive           PT Long Term Goals - 02/11/16 1243      PT LONG TERM GOAL #1   Title be independent in advanced HEP   Time 8   Period Weeks   Status On-going     PT LONG TERM GOAL #2   Title reduce FOTO to < or = to 43% limitation   Period Weeks   Status On-going     PT LONG TERM GOAL #3   Title ascend steps with step-over-step with use of 1 rail   Time 8   Period Weeks   Status On-going     PT LONG TERM GOAL #4   Title demonstrate Rt knee AROM -5 to 100 degrees to improve mobility without substitution   Time 8   Status On-going     PT LONG TERM GOAL #5   Title imrprove strength and endurance to walk for 45 minutes in the community without need for rest   Time 8   Period Weeks   Status Partially Met     PT LONG TERM GOAL #6   Title demonstrate symmetry with ambulation on level surface with use of cane   Time 8   Period Weeks   Status On-going               Plan - 02/16/16 0933    Clinical Impression Statement Soreness was really  reduced today which showed up in better stretching into flexion. There was  significanlty less resistance during stretching and soft tissue work. In sitting pt reached a solid 85 degrees knee flexion in sitting.    Rehab Potential Good   PT Frequency 3x / week   PT Duration 8 weeks   PT Treatment/Interventions ADLs/Self Care Home Management;Cryotherapy;Electrical Stimulation;Moist Heat;Therapeutic exercise;Therapeutic activities;Functional mobility training;Stair training;Gait training;Ultrasound;Balance training;Neuromuscular re-education;Patient/family education;Manual techniques;Vasopneumatic Device;Taping;Passive range of motion;Scar mobilization   PT Next Visit Plan Knee ROM, stretching, manual techniques for ROM, work on stairs   Consulted and Agree with Plan of Care Patient      Patient will benefit from skilled therapeutic intervention in order to improve the following deficits and impairments:  Abnormal gait, Decreased range of motion, Decreased endurance, Decreased activity tolerance, Pain, Increased edema, Decreased strength, Impaired flexibility  Visit Diagnosis: Pain in right knee  Stiffness of right knee, not elsewhere classified  Muscle weakness (generalized)  Localized edema  Other abnormalities of gait and mobility     Problem List Patient Active Problem List   Diagnosis Date Noted  . Status post total right knee replacement 12/22/2015  . Hepatic steatosis 02/09/2015  . Hot flashes 09/09/2014  . Osteopenia 09/09/2014  . Breast cancer of upper-inner quadrant of left female breast ([place]) 07/23/2013  . Memory loss 07/14/2013  . Headache 07/14/2013  . Other malaise and fatigue 07/14/2013  . Acute pulmonary embolism ([place]) 10/07/2011  . Leukocytosis 10/07/2011  . Thyroid mass, left inferior lobe, 3.6cm [id number] 10/07/2011  . Hyponatremia 10/07/2011  . Obesity (BMI 30-39.9) 08/07/2011  . Post concussion syndrome     Laterrance Nauta, PTA 02/16/2016, 10:19 AM  [facility] [address], [place], [place], [address] Phone: [phone]   Fax:  [phone]  Name: Alyssa [name] MRN: [id number] Date of Birth: Jan 09, 1951

## 2016-02-18 ENCOUNTER — Ambulatory Visit: Payer: BC Managed Care – PPO | Admitting: Physical Therapy

## 2016-02-18 ENCOUNTER — Encounter: Payer: Self-pay | Admitting: Physical Therapy

## 2016-02-18 DIAGNOSIS — R2689 Other abnormalities of gait and mobility: Secondary | ICD-10-CM

## 2016-02-18 DIAGNOSIS — M25661 Stiffness of right knee, not elsewhere classified: Secondary | ICD-10-CM

## 2016-02-18 DIAGNOSIS — R600 Localized edema: Secondary | ICD-10-CM

## 2016-02-18 DIAGNOSIS — M6281 Muscle weakness (generalized): Secondary | ICD-10-CM

## 2016-02-18 DIAGNOSIS — M25561 Pain in right knee: Secondary | ICD-10-CM | POA: Diagnosis not present

## 2016-02-18 NOTE — Therapy (Signed)
[facility] [address] [place], [place], [address] Phone: [phone]   Fax:  [phone]  Physical Therapy Treatment  Patient Details  Name: CORENA TILSON MRN: [id number] Date of Birth: 04-14-1951 Referring Provider: Rodell Perna, MD  Encounter Date: 02/18/2016      PT End of Session - 02/18/16 0931    Visit Number 14   Date for PT Re-Evaluation 03/08/16   PT Start Time 0923   PT Stop Time 1030   PT Time Calculation (min) 67 min   Activity Tolerance Patient tolerated treatment well   Behavior During Therapy [facility] for tasks assessed/performed      Past Medical History:  Diagnosis Date  . Abdominal pain   . Breast cancer ([place]) 06/2013   left upper inner  . Cancer ([place])    breast  . Diabetes mellitus without complication ([place])    Takes Tradjenta  . Family history of adverse reaction to anesthesia    Patients mother had to be resusciated after having anesthesia  . GERD (gastroesophageal reflux disease)   . Headache(784.0)    Takes Depakote  . Hernia, incisional periumbilical, incarcerated 1/49/7026  . Hot flashes   . Hx of radiation therapy 09/09/13- 10/13/13   left breast 5000 cGy in 25 sessions, no boost  . Hyperlipidemia   . Hypertension    PCP DR Nolon Rod  . Hypothyroidism   . Peripheral vascular disease ([place])    pulmonary embolis-still have some  . Post concussion syndrome    4 yrs ago- sees [facility]- Dr Leonie Man every 6 months-   has sleep study 2011 following accident but was neg per patient-  short term memory issues, dates and times  . Pulmonary embolism ([place]) 2013  . Recurrent upper respiratory infection (URI)    FLU 08/23/11-08/29/11 then URI 08/29/11- 09/08/11- S/P zpack and steroids-  improved      no cough or congestion  . Thyroid disease   . 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 Ventral hernia     Past Surgical History:  Procedure Laterality Date  . APPENDECTOMY   2002   lap appy.  Dr. Hassell Done  . BREAST LUMPECTOMY WITH NEEDLE LOCALIZATION AND AXILLARY SENTINEL LYMPH NODE BX Left 08/04/2013   Procedure: BREAST LUMPECTOMY WITH NEEDLE LOCALIZATION AND AXILLARY SENTINEL LYMPH NODE Biopsy x 3;  Surgeon: Rolm Bookbinder, MD;  Location: [place];  Service: General;  Laterality: Left;  . BREAST SURGERY    . CARPAL TUNNEL RELEASE  10/2009-12/2010   left - right  . CESAREAN SECTION  X 2  . COLONOSCOPY    . HERNIA REPAIR  09/15/11   Ventral w/mesh  . JOINT REPLACEMENT    . KNEE ARTHROPLASTY Right 12/22/2015   Procedure: COMPUTER ASSISTED TOTAL KNEE ARTHROPLASTY;  Surgeon: Marybelle Killings, MD;  Location: [facility] [place];  Service: Orthopedics;  Laterality: Right;  . LAPAROSCOPIC CHOLECYSTECTOMY  2006   Dr. Bubba Camp  . TARSAL TUNNEL RELEASE Bilateral   . THYROIDECTOMY, PARTIAL    . VENTRAL HERNIA REPAIR  09/29/2011   Procedure: LAPAROSCOPIC VENTRAL HERNIA;  Surgeon: Adin Hector, MD;  [facility];  Service: General;  Laterality: N/A;  Laparoscopic Ventral Wall Hernia Repair with Mesh    There were no vitals filed for this visit.      Subjective Assessment - 02/18/16 0930    Subjective Pain still coming down, pt describes having only discomfort this AM. Begins school Monday.   Currently in  Pain? Yes   Pain Score 3    Pain Location Knee   Pain Orientation Right   Pain Descriptors / Indicators Discomfort   Multiple Pain Sites No            [facility] PT Assessment - 02/18/16 0001      AROM   Right Knee Flexion 86                     [facility] Adult PT Treatment/Exercise - 02/18/16 0001      Knee/Hip Exercises: Stretches   Gastroc Stretch --  Rocker board x 26mn     Knee/Hip Exercises: Aerobic   Recumbent Bike Rocking x 7 min  Did bike after manual which seemed to make bike more effecti   Nustep L2 x10 min     Knee/Hip Exercises: Standing   Forward Step Up Right;2 sets;10 reps;Hand Hold: 2;Step Height: 6"     Knee/Hip Exercises: Seated   Sit to  Sand 3 sets;10 reps;without UE support  Progressively moving feet back each set to increase knee fle     Vasopneumatic   Number Minutes Vasopneumatic  15 minutes   Vasopnuematic Location  Knee   Vasopneumatic Pressure High   Vasopneumatic Temperature  3 flakes     Manual Therapy   Soft tissue mobilization Knee, quad, scar, static stretching for flexion, MWM for flexion  Pt sitting off Hi Lo table                  PT Short Term Goals - 02/14/16 1016      PT SHORT TERM GOAL #2   Title demonstrate Rt knee AROM flexion to > or = to 90 degrees to improve sitting and negotiating steps without substitution   Time 4   Period Weeks   Status On-going  80 degrees passive           PT Long Term Goals - 02/11/16 1243      PT LONG TERM GOAL #1   Title be independent in advanced HEP   Time 8   Period Weeks   Status On-going     PT LONG TERM GOAL #2   Title reduce FOTO to < or = to 43% limitation   Period Weeks   Status On-going     PT LONG TERM GOAL #3   Title ascend steps with step-over-step with use of 1 rail   Time 8   Period Weeks   Status On-going     PT LONG TERM GOAL #4   Title demonstrate Rt knee AROM -5 to 100 degrees to improve mobility without substitution   Time 8   Status On-going     PT LONG TERM GOAL #5   Title imrprove strength and endurance to walk for 45 minutes in the community without need for rest   Time 8   Period Weeks   Status Partially Met     PT LONG TERM GOAL #6   Title demonstrate symmetry with ambulation on level surface with use of cane   Time 8   Period Weeks   Status On-going               Plan - 02/18/16 1002    Clinical Impression Statement Knee Flexion slightly better today. Was able to perfrom 6 inch step ups with good knee flexion and use of quad.    Rehab Potential Good   PT Frequency 3x / week   PT Duration 8 weeks  PT Treatment/Interventions ADLs/Self Care Home Management;Cryotherapy;Electrical  Stimulation;Moist Heat;Therapeutic exercise;Therapeutic activities;Functional mobility training;Stair training;Gait training;Ultrasound;Balance training;Neuromuscular re-education;Patient/family education;Manual techniques;Vasopneumatic Device;Taping;Passive range of motion;Scar mobilization   PT Next Visit Plan Knee ROM, stretching, manual techniques for ROM, work on stairs   Consulted and Agree with Plan of Care Patient      Patient will benefit from skilled therapeutic intervention in order to improve the following deficits and impairments:  Abnormal gait, Decreased range of motion, Decreased endurance, Decreased activity tolerance, Pain, Increased edema, Decreased strength, Impaired flexibility  Visit Diagnosis: Pain in right knee  Stiffness of right knee, not elsewhere classified  Muscle weakness (generalized)  Localized edema  Other abnormalities of gait and mobility     Problem List Patient Active Problem List   Diagnosis Date Noted  . Status post total right knee replacement 12/22/2015  . Hepatic steatosis 02/09/2015  . Hot flashes 09/09/2014  . Osteopenia 09/09/2014  . 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 Memory loss 07/14/2013  . Headache 07/14/2013  . Other malaise and fatigue 07/14/2013  . Acute pulmonary embolism ([place]) 10/07/2011  . Leukocytosis 10/07/2011  . Thyroid mass, left inferior lobe, 3.6cm [id number] 10/07/2011  . Hyponatremia 10/07/2011  . Obesity (BMI 30-39.9) 08/07/2011  . Post concussion syndrome     Baley Shands, PTA 02/18/2016, 10:19 AM  [facility] [address], [place], [place], [address] Phone: [phone]   Fax:  [phone]  Name: MIRTA MALLY MRN: [id number] Date of Birth: 03-May-1951

## 2016-02-21 ENCOUNTER — Encounter: Payer: Self-pay | Admitting: Physical Therapy

## 2016-02-21 ENCOUNTER — Ambulatory Visit: Payer: BC Managed Care – PPO | Admitting: Physical Therapy

## 2016-02-21 DIAGNOSIS — R2689 Other abnormalities of gait and mobility: Secondary | ICD-10-CM

## 2016-02-21 DIAGNOSIS — M25561 Pain in right knee: Secondary | ICD-10-CM | POA: Diagnosis not present

## 2016-02-21 DIAGNOSIS — R600 Localized edema: Secondary | ICD-10-CM

## 2016-02-21 DIAGNOSIS — M6281 Muscle weakness (generalized): Secondary | ICD-10-CM

## 2016-02-21 DIAGNOSIS — M25661 Stiffness of right knee, not elsewhere classified: Secondary | ICD-10-CM

## 2016-02-21 NOTE — Therapy (Signed)
[facility] [address], [place], [place], [address] Phone: [phone]   Fax:  [phone]  Physical Therapy Treatment  Patient Details  Name: Alyssa [name] MRN: [id number] Date of Birth: 1951-01-15 Referring Provider: Rodell Perna, MD  Encounter Date: 02/21/2016      PT End of Session - 02/21/16 1624    Visit Number 15   Date for PT Re-Evaluation 03/08/16   PT Start Time 1615   PT Stop Time 1710   PT Time Calculation (min) 55 min   Activity Tolerance Patient tolerated treatment well   Behavior During Therapy [facility] for tasks assessed/performed      Past Medical History:  Diagnosis Date  . Abdominal pain   . Breast cancer ([place]) 06/2013   left upper inner  . Cancer ([place])    breast  . Diabetes mellitus without complication ([place])    Takes Tradjenta  . Family history of adverse reaction to anesthesia    Patients mother had to be resusciated after having anesthesia  . GERD (gastroesophageal reflux disease)   . Headache(784.0)    Takes Depakote  . Hernia, incisional periumbilical, incarcerated 9/92/4268  . Hot flashes   . Hx of radiation therapy 09/09/13- 10/13/13   left breast 5000 cGy in 25 sessions, no boost  . Hyperlipidemia   . Hypertension    PCP DR Nolon Rod  . Hypothyroidism   . Peripheral vascular disease ([place])    pulmonary embolis-still have some  . Post concussion syndrome    4 yrs ago- sees [facility]- Dr Leonie Man every 6 months-   has sleep study 2011 following accident but was neg per patient-  short term memory issues, dates and times  . Pulmonary embolism ([place]) 2013  . Recurrent upper respiratory infection (URI)    FLU 08/23/11-08/29/11 then URI 08/29/11- 09/08/11- S/P zpack and steroids-  improved      no cough or congestion  . Thyroid disease   . 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 Ventral hernia     Past Surgical History:  Procedure Laterality Date  . APPENDECTOMY   2002   lap appy.  Dr. Hassell Done  . BREAST LUMPECTOMY WITH NEEDLE LOCALIZATION AND AXILLARY SENTINEL LYMPH NODE BX Left 08/04/2013   Procedure: BREAST LUMPECTOMY WITH NEEDLE LOCALIZATION AND AXILLARY SENTINEL LYMPH NODE Biopsy x 3;  Surgeon: Rolm Bookbinder, MD;  Location: [facility];  Service: General;  Laterality: Left;  . BREAST SURGERY    . CARPAL TUNNEL RELEASE  10/2009-12/2010   left - right  . CESAREAN SECTION  X 2  . COLONOSCOPY    . HERNIA REPAIR  09/15/11   Ventral w/mesh  . JOINT REPLACEMENT    . KNEE ARTHROPLASTY Right 12/22/2015   Procedure: COMPUTER ASSISTED TOTAL KNEE ARTHROPLASTY;  Surgeon: Marybelle Killings, MD;  Location: [facility];  Service: Orthopedics;  Laterality: Right;  . LAPAROSCOPIC CHOLECYSTECTOMY  2006   Dr. Bubba Camp  . TARSAL TUNNEL RELEASE Bilateral   . THYROIDECTOMY, PARTIAL    . VENTRAL HERNIA REPAIR  09/29/2011   Procedure: LAPAROSCOPIC VENTRAL HERNIA;  Surgeon: Adin Hector, MD;  Location[facility];  Service: General;  Laterality: N/A;  Laparoscopic Ventral Wall Hernia Repair with Mesh    There were no vitals filed for this visit.      Subjective Assessment - 02/21/16 1622    Subjective Today is painful day due to sitting at work all day.    Pertinent History history of  breast cancer   Limitations Standing;Walking   How long can you walk comfortably? 20 minutes or less   Patient Stated Goals improve flexion, wean from walker, improve strength, reduce pain   Currently in Pain? Yes   Pain Score 5    Pain Location Knee   Pain Orientation Right   Pain Descriptors / Indicators Tightness   Pain Type Surgical pain   Pain Onset 1 to 4 weeks ago   Pain Frequency Constant   Aggravating Factors  sitting alot   Pain Relieving Factors get up and walk   Effect of Pain on Daily Activities moderate   Multiple Pain Sites No            [facility] PT Assessment - 02/21/16 0001      PROM   Right Knee Flexion 95     Palpation   Patella mobility decreased right patella mobility                      [facility] Adult PT Treatment/Exercise - 02/21/16 0001      Knee/Hip Exercises: Aerobic   Recumbent Bike Rocking x 7 min  Did bike after manual which seemed to make bike more effecti   Nustep L2 x10 min     Modalities   Modalities Vasopneumatic     Vasopneumatic   Number Minutes Vasopneumatic  15 minutes   Vasopnuematic Location  Knee   Vasopneumatic Pressure High   Vasopneumatic Temperature  3 flakes     Manual Therapy   Manual Therapy Joint mobilization;Soft tissue mobilization;Passive ROM   Joint Mobilization fibula ant/post. glide grade 3, anterior glide of right knee grade 3; right patella mobiitzation all directions   Soft tissue mobilization posterior and anterior right kne, right quads, right gastroc,    Passive ROM right knee flexion on high/low mat with contract relax                PT Education - 02/21/16 1656    Education provided No          PT Short Term Goals - 02/21/16 1628      PT SHORT TERM GOAL #1   Title be independent in initial HEP   Time 4   Period Weeks   Status Achieved     PT SHORT TERM GOAL #2   Title demonstrate Rt knee AROM flexion to > or = to 90 degrees to improve sitting and negotiating steps without substitution   Time 4   Period Weeks   Status On-going     PT SHORT TERM GOAL #3   Title wean from walker to use of cane for all distances   Time 4   Period Weeks   Status Achieved     PT SHORT TERM GOAL #4   Title report < or = to 4/10 Rt knee pain with standing and walking   Time 4   Period Weeks   Status Achieved           PT Long Term Goals - 02/21/16 1630      PT LONG TERM GOAL #1   Title be independent in advanced HEP   Time 8   Period Weeks   Status On-going     PT LONG TERM GOAL #2   Title reduce FOTO to < or = to 43% limitation   Time 8   Period Weeks   Status On-going     PT LONG TERM GOAL #3   Title ascend  steps with step-over-step with use of 1 rail   Time 8    Period Weeks   Status On-going     PT LONG TERM GOAL #4   Title demonstrate Rt knee AROM -5 to 100 degrees to improve mobility without substitution   Time 8   Period Weeks   Status On-going     PT LONG TERM GOAL #5   Title imrprove strength and endurance to walk for 45 minutes in the community without need for rest   Time 8   Period Weeks   Status Partially Met     PT LONG TERM GOAL #6   Title demonstrate symmetry with ambulation on level surface with use of cane   Time 8   Period Weeks   Status On-going               Plan - 02/21/16 1656    Clinical Impression Statement Patient has increased right knee flexion.  Right patella mobility is limited.  Tightness along the right gastroc, lateral quads, and around fibula head. Patient has increased right knee flexion PROM to 95 degrees. Patient will benefit from skilled therapy to restore gait and reduce pain.    Rehab Potential Good   PT Frequency 3x / week   PT Duration 8 weeks   PT Treatment/Interventions ADLs/Self Care Home Management;Cryotherapy;Electrical Stimulation;Moist Heat;Therapeutic exercise;Therapeutic activities;Functional mobility training;Stair training;Gait training;Ultrasound;Balance training;Neuromuscular re-education;Patient/family education;Manual techniques;Vasopneumatic Device;Taping;Passive range of motion;Scar mobilization   PT Next Visit Plan Knee ROM, stretching, manual techniques for ROM, work on stairs; measure right knee strength; right patella mobilization   PT Home Exercise Plan progress as needed   Recommended Other Services None   Consulted and Agree with Plan of Care Patient      Patient will benefit from skilled therapeutic intervention in order to improve the following deficits and impairments:  Abnormal gait, Decreased range of motion, Decreased endurance, Decreased activity tolerance, Pain, Increased edema, Decreased strength, Impaired flexibility  Visit Diagnosis: Pain in right  knee  Stiffness of right knee, not elsewhere classified  Muscle weakness (generalized)  Localized edema  Other abnormalities of gait and mobility     Problem List Patient Active Problem List   Diagnosis Date Noted  . Status post total right knee replacement 12/22/2015  . Hepatic steatosis 02/09/2015  . Hot flashes 09/09/2014  . Osteopenia 09/09/2014  . Breast cancer of upper-inner quadrant of left female breast ([place]) 07/23/2013  . Memory loss 07/14/2013  . Headache 07/14/2013  . Other malaise and fatigue 07/14/2013  . Acute pulmonary embolism ([place]) 10/07/2011  . Leukocytosis 10/07/2011  . Thyroid mass, left inferior lobe, 3.6cm [id number] 10/07/2011  . Hyponatremia 10/07/2011  . Obesity (BMI 30-39.9) 08/07/2011  . Post concussion syndrome     Earlie Counts, PT 02/21/16 5:00 PM   [facility] [address] [place], [place], [address] Phone: [phone]   Fax:  [phone]  Name: Alyssa [name] MRN: [id number] Date of Birth: 10-Aug-1950

## 2016-02-23 ENCOUNTER — Encounter: Payer: Self-pay | Admitting: Physical Therapy

## 2016-02-23 ENCOUNTER — Ambulatory Visit: Payer: BC Managed Care – PPO | Attending: Orthopaedic Surgery | Admitting: Physical Therapy

## 2016-02-23 DIAGNOSIS — R600 Localized edema: Secondary | ICD-10-CM | POA: Diagnosis present

## 2016-02-23 DIAGNOSIS — M6281 Muscle weakness (generalized): Secondary | ICD-10-CM | POA: Diagnosis present

## 2016-02-23 DIAGNOSIS — M25561 Pain in right knee: Secondary | ICD-10-CM | POA: Insufficient documentation

## 2016-02-23 DIAGNOSIS — M25661 Stiffness of right knee, not elsewhere classified: Secondary | ICD-10-CM | POA: Insufficient documentation

## 2016-02-23 DIAGNOSIS — R2689 Other abnormalities of gait and mobility: Secondary | ICD-10-CM | POA: Insufficient documentation

## 2016-02-23 NOTE — Therapy (Signed)
[facility] [address] [place], [place], [address] Phone: [phone]   Fax:  [phone]  Physical Therapy Treatment  Patient Details  Name: Alyssa [name] MRN: [id number] Date of Birth: [birth year] Referring Provider: Rodell Perna, MD  Encounter Date: 02/23/2016      PT End of Session - 02/23/16 1648    Visit Number 16   Date for PT Re-Evaluation 03/08/16   PT Start Time 1550   PT Stop Time 9476   PT Time Calculation (min) 75 min   Activity Tolerance Patient limited by pain;Patient limited by fatigue  Knee very swollen today   Behavior During Therapy [facility] for tasks assessed/performed      Past Medical History:  Diagnosis Date  . Abdominal pain   . Breast cancer ([place]) 06/2013   left upper inner  . Cancer ([place])    breast  . Diabetes mellitus without complication ([place])    Takes Tradjenta  . Family history of adverse reaction to anesthesia    Patients mother had to be resusciated after having anesthesia  . GERD (gastroesophageal reflux disease)   . Headache(784.0)    Takes Depakote  . Hernia, incisional periumbilical, incarcerated 5/46/5035  . Hot flashes   . Hx of radiation therapy 09/09/13- 10/13/13   left breast 5000 cGy in 25 sessions, no boost  . Hyperlipidemia   . Hypertension    PCP DR Nolon Rod  . Hypothyroidism   . Peripheral vascular disease ([place])    pulmonary embolis-still have some  . Post concussion syndrome    4 yrs ago- sees [facility]- Dr Leonie Man every 6 months-   has sleep study 2011 following accident but was neg per patient-  short term memory issues, dates and times  . Pulmonary embolism ([place]) 2013  . Recurrent upper respiratory infection (URI)    FLU 08/23/11-08/29/11 then URI 08/29/11- 09/08/11- S/P zpack and steroids-  improved      no cough or congestion  . Thyroid disease   . Thyroid mass, left inferior lobe, 3.6cm [id number] 10/07/2011  . Ventral hernia     Past Surgical History:   Procedure Laterality Date  . APPENDECTOMY  2002   lap appy.  Dr. Hassell Done  . BREAST LUMPECTOMY WITH NEEDLE LOCALIZATION AND AXILLARY SENTINEL LYMPH NODE BX Left 08/04/2013   Procedure: BREAST LUMPECTOMY WITH NEEDLE LOCALIZATION AND AXILLARY SENTINEL LYMPH NODE Biopsy x 3;  Surgeon: Rolm Bookbinder, MD;  Location: [facility];  Service: General;  Laterality: Left;  . BREAST SURGERY    . CARPAL TUNNEL RELEASE  10/2009-12/2010   left - right  . CESAREAN SECTION  X 2  . COLONOSCOPY    . HERNIA REPAIR  09/15/11   Ventral w/mesh  . JOINT REPLACEMENT    . KNEE ARTHROPLASTY Right 12/22/2015   Procedure: COMPUTER ASSISTED TOTAL KNEE ARTHROPLASTY;  Surgeon: Marybelle Killings, MD;  Location: [facility];  Service: Orthopedics;  Laterality: Right;  . LAPAROSCOPIC CHOLECYSTECTOMY  2006   Dr. Bubba Camp  . TARSAL TUNNEL RELEASE Bilateral   . THYROIDECTOMY, PARTIAL    . VENTRAL HERNIA REPAIR  09/29/2011   Procedure: LAPAROSCOPIC VENTRAL HERNIA;  Surgeon: Adin Hector, MD;  [facility];  Service: General;  Laterality: N/A;  Laparoscopic Ventral Wall Hernia Repair with Mesh    There were no vitals filed for this visit.      Subjective Assessment - 02/23/16 1616    Subjective Really sore from climbing stairs today at school.  Currently in Pain? Yes   Pain Score 6    Pain Location Knee   Pain Orientation Right   Pain Descriptors / Indicators Tightness;Sore   Multiple Pain Sites No                         [facility] Adult PT Treatment/Exercise - 02/23/16 0001      Knee/Hip Exercises: Stretches   Gastroc Stretch --  Rockerboard ankle stretch 3x 30 sec     Knee/Hip Exercises: Aerobic   Recumbent Bike Rocking x 7 min  Did bike after manual which seemed to make bike more effecti   Nustep L2 x10 min     Knee/Hip Exercises: Standing   Forward Step Up Right;2 sets;10 reps;Hand Hold: 2;Step Height: 6"     Knee/Hip Exercises: Seated   Sit to Sand 2 sets;10 reps;without UE support  Used blue [employer]  in chair secondary to pain today.     Knee/Hip Exercises: Supine   Heel Slides --  Heels on red ball for flexion 20x     Knee/Hip Exercises: Prone   Prone Knee Hang 3 minutes;Weights   Prone Knee Hang Weights (lbs) 1     Vasopneumatic   Number Minutes Vasopneumatic  15 minutes   Vasopnuematic Location  Knee   Vasopneumatic Pressure High   Vasopneumatic Temperature  3 flakes     Manual Therapy   Soft tissue mobilization Posterior knee/hamstring and distal scar massage   Passive ROM Knee flexion in prone                  PT Short Term Goals - 02/21/16 1628      PT SHORT TERM GOAL #1   Title be independent in initial HEP   Time 4   Period Weeks   Status Achieved     PT SHORT TERM GOAL #2   Title demonstrate Rt knee AROM flexion to > or = to 90 degrees to improve sitting and negotiating steps without substitution   Time 4   Period Weeks   Status On-going     PT SHORT TERM GOAL #3   Title wean from walker to use of cane for all distances   Time 4   Period Weeks   Status Achieved     PT SHORT TERM GOAL #4   Title report < or = to 4/10 Rt knee pain with standing and walking   Time 4   Period Weeks   Status Achieved           PT Long Term Goals - 02/21/16 1630      PT LONG TERM GOAL #1   Title be independent in advanced HEP   Time 8   Period Weeks   Status On-going     PT LONG TERM GOAL #2   Title reduce FOTO to < or = to 43% limitation   Time 8   Period Weeks   Status On-going     PT LONG TERM GOAL #3   Title ascend steps with step-over-step with use of 1 rail   Time 8   Period Weeks   Status On-going     PT LONG TERM GOAL #4   Title demonstrate Rt knee AROM -5 to 100 degrees to improve mobility without substitution   Time 8   Period Weeks   Status On-going     PT LONG TERM GOAL #5   Title imrprove strength and endurance to walk for  45 minutes in the community without need for rest   Time 8   Period Weeks   Status Partially Met      PT LONG TERM GOAL #6   Title demonstrate symmetry with ambulation on level surface with use of cane   Time 8   Period Weeks   Status On-going               Plan - 02/23/16 1649    Clinical Impression Statement Pt reports busiest day at work so far, a lot of stairs with no time to prop knee or ice. This has resulted in a very sore/swollen knee. Somewhat limited in exercises but we could concentrate more on soft tissue work.    Rehab Potential Good   PT Frequency 3x / week   PT Duration 8 weeks   PT Treatment/Interventions ADLs/Self Care Home Management;Cryotherapy;Electrical Stimulation;Moist Heat;Therapeutic exercise;Therapeutic activities;Functional mobility training;Stair training;Gait training;Ultrasound;Balance training;Neuromuscular re-education;Patient/family education;Manual techniques;Vasopneumatic Device;Taping;Passive range of motion;Scar mobilization   PT Next Visit Plan Knee ROM, stretching, manual techniques for ROM, work on stairs; measure right knee strength; right patella mobilization   Consulted and Agree with Plan of Care Patient      Patient will benefit from skilled therapeutic intervention in order to improve the following deficits and impairments:  Abnormal gait, Decreased range of motion, Decreased endurance, Decreased activity tolerance, Pain, Increased edema, Decreased strength, Impaired flexibility  Visit Diagnosis: Pain in right knee  Stiffness of right knee, not elsewhere classified  Muscle weakness (generalized)  Localized edema     Problem List Patient Active Problem List   Diagnosis Date Noted  . Status post total right knee replacement 12/22/2015  . Hepatic steatosis 02/09/2015  . Hot flashes 09/09/2014  . Osteopenia 09/09/2014  . 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 Memory loss 07/14/2013  . Headache 07/14/2013  . Other malaise and fatigue 07/14/2013  . Acute pulmonary embolism ([place])  10/07/2011  . Leukocytosis 10/07/2011  . Thyroid mass, left inferior lobe, 3.6cm [id number] 10/07/2011  . Hyponatremia 10/07/2011  . Obesity (BMI 30-39.9) 08/07/2011  . Post concussion syndrome     Melodi Happel, PTA 02/23/2016, 4:53 PM  [facility] [address], [place], [place], [address] Phone: [phone]   Fax:  [phone]  Name: DAILY DOE MRN: [id number] Date of Birth: Feb 13, 1951

## 2016-02-26 ENCOUNTER — Other Ambulatory Visit: Payer: Self-pay | Admitting: Oncology

## 2016-02-29 ENCOUNTER — Ambulatory Visit: Payer: BC Managed Care – PPO

## 2016-02-29 DIAGNOSIS — M6281 Muscle weakness (generalized): Secondary | ICD-10-CM

## 2016-02-29 DIAGNOSIS — R600 Localized edema: Secondary | ICD-10-CM

## 2016-02-29 DIAGNOSIS — M25661 Stiffness of right knee, not elsewhere classified: Secondary | ICD-10-CM

## 2016-02-29 DIAGNOSIS — M25561 Pain in right knee: Secondary | ICD-10-CM

## 2016-02-29 DIAGNOSIS — R2689 Other abnormalities of gait and mobility: Secondary | ICD-10-CM

## 2016-02-29 NOTE — Therapy (Signed)
[facility] [address], [place], [place], [address] Phone: [phone]   Fax:  [phone]  Physical Therapy Treatment  Patient Details  Name: Alyssa [name] MRN: [id number] Date of Birth: Nov 09, 1950 Referring Provider: Rodell Perna, MD  Encounter Date: 02/29/2016      PT End of Session - 02/29/16 1614    Visit Number 17   Date for PT Re-Evaluation 03/08/16   PT Start Time 1529   PT Stop Time 1630   PT Time Calculation (min) 61 min   Activity Tolerance Patient tolerated treatment well   Behavior During Therapy [facility] for tasks assessed/performed      Past Medical History:  Diagnosis Date  . Abdominal pain   . Breast cancer ([place]) 06/2013   left upper inner  . Cancer ([place])    breast  . Diabetes mellitus without complication ([place])    Takes Tradjenta  . Family history of adverse reaction to anesthesia    Patients mother had to be resusciated after having anesthesia  . GERD (gastroesophageal reflux disease)   . Headache(784.0)    Takes Depakote  . Hernia, incisional periumbilical, incarcerated 3/49/1791  . Hot flashes   . Hx of radiation therapy 09/09/13- 10/13/13   left breast 5000 cGy in 25 sessions, no boost  . Hyperlipidemia   . Hypertension    PCP DR Nolon Rod  . Hypothyroidism   . Peripheral vascular disease ([place])    pulmonary embolis-still have some  . Post concussion syndrome    4 yrs ago- sees [facility]- Dr Leonie Man every 6 months-   has sleep study 2011 following accident but was neg per patient-  short term memory issues, dates and times  . Pulmonary embolism ([place]) 2013  . Recurrent upper respiratory infection (URI)    FLU 08/23/11-08/29/11 then URI 08/29/11- 09/08/11- S/P zpack and steroids-  improved      no cough or congestion  . Thyroid disease   . 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 Ventral hernia     Past Surgical History:  Procedure Laterality Date  . APPENDECTOMY   2002   lap appy.  Dr. Hassell Done  . BREAST LUMPECTOMY WITH NEEDLE LOCALIZATION AND AXILLARY SENTINEL LYMPH NODE BX Left 08/04/2013   Procedure: BREAST LUMPECTOMY WITH NEEDLE LOCALIZATION AND AXILLARY SENTINEL LYMPH NODE Biopsy x 3;  Surgeon: Rolm Bookbinder, MD;  Location: [place];  Service: General;  Laterality: Left;  . BREAST SURGERY    . CARPAL TUNNEL RELEASE  10/2009-12/2010   left - right  . CESAREAN SECTION  X 2  . COLONOSCOPY    . HERNIA REPAIR  09/15/11   Ventral w/mesh  . JOINT REPLACEMENT    . KNEE ARTHROPLASTY Right 12/22/2015   Procedure: COMPUTER ASSISTED TOTAL KNEE ARTHROPLASTY;  Surgeon: Marybelle Killings, MD;  Location: [place];  Service: Orthopedics;  Laterality: Right;  . LAPAROSCOPIC CHOLECYSTECTOMY  2006   Dr. Bubba Camp  . TARSAL TUNNEL RELEASE Bilateral   . THYROIDECTOMY, PARTIAL    . VENTRAL HERNIA REPAIR  09/29/2011   Procedure: LAPAROSCOPIC VENTRAL HERNIA;  Surgeon: Adin Hector, MD;  [facility];  Service: General;  Laterality: N/A;  Laparoscopic Ventral Wall Hernia Repair with Mesh    There were no vitals filed for this visit.      Subjective Assessment - 02/29/16 1541    Subjective Pt returned to work last week to set-up her room.     Currently in Pain? Yes  Pain Score 4    Pain Location Knee   Pain Orientation Right   Pain Descriptors / Indicators Tightness;Sore   Pain Type Surgical pain   Pain Onset More than a month ago   Pain Frequency Constant   Aggravating Factors  sitting alot, walking up and down stairs   Pain Relieving Factors change of positions            [facility] PT Assessment - 02/29/16 0001      Assessment   Medical Diagnosis TKA Rt with poor flexion     AROM   Right Knee Flexion 88  tested in supine                     [facility] Adult PT Treatment/Exercise - 02/29/16 0001      Knee/Hip Exercises: Aerobic   Recumbent Bike Rocking x 7 min  Did bike after manual which seemed to make bike more effecti   Nustep L2 x10 min      Knee/Hip Exercises: Standing   Forward Step Up Right;2 sets;10 reps;Hand Hold: 2;Step Height: 6"  verbal cues for techniques     Knee/Hip Exercises: Seated   Sit to Sand 2 sets;10 reps;without UE support  Used blue Airex in chair secondary to pain today.     Knee/Hip Exercises: Supine   Heel Slides --  Heels on red ball for flexion 20x     Vasopneumatic   Number Minutes Vasopneumatic  15 minutes   Vasopnuematic Location  Knee   Vasopneumatic Pressure High   Vasopneumatic Temperature  3 flakes     Manual Therapy   Soft tissue mobilization Distal quad/hamstring and distal scar massage   Passive ROM Knee flexion in supine                  PT Short Term Goals - 02/29/16 1543      PT SHORT TERM GOAL #2   Title demonstrate Rt knee AROM flexion to > or = to 90 degrees to improve sitting and negotiating steps without substitution   Status Achieved     PT SHORT TERM GOAL #3   Title wean from walker to use of cane for all distances   Status Achieved     PT SHORT TERM GOAL #4   Title report < or = to 4/10 Rt knee pain with standing and walking   Status Achieved           PT Long Term Goals - 02/29/16 1548      PT LONG TERM GOAL #1   Title be independent in advanced HEP   Time 8   Period Weeks   Status On-going     PT LONG TERM GOAL #3   Title ascend steps with step-over-step with use of 1 rail   Time 8   Period Weeks   Status On-going     PT LONG TERM GOAL #5   Title imrprove strength and endurance to walk for 45 minutes in the community without need for rest   Time 8   Period Weeks   Status On-going               Plan - 02/29/16 1549    Clinical Impression Statement Pt with continued limited Rt knee flexion.  Pt has returned to work and has increased pain in the Rt knee due to walking up and down the steps many times a day.  Walking is limited to 30 minutes due to fatigue.  Pt continues to negotiate steps with step-to pattern.  Pt will  continue to benefit from skilled PT for ROM, strength, flexiblity and edema management.     Rehab Potential Good   PT Frequency 3x / week   PT Duration 8 weeks   PT Treatment/Interventions ADLs/Self Care Home Management;Cryotherapy;Electrical Stimulation;Moist Heat;Therapeutic exercise;Therapeutic activities;Functional mobility training;Stair training;Gait training;Ultrasound;Balance training;Neuromuscular re-education;Patient/family education;Manual techniques;Vasopneumatic Device;Taping;Passive range of motion;Scar mobilization   PT Next Visit Plan Knee ROM, stretching, manual techniques for ROM, work on stairs; measure right knee strength; right patella mobilization      Patient will benefit from skilled therapeutic intervention in order to improve the following deficits and impairments:  Abnormal gait, Decreased range of motion, Decreased endurance, Decreased activity tolerance, Pain, Increased edema, Decreased strength, Impaired flexibility  Visit Diagnosis: Pain in right knee  Stiffness of right knee, not elsewhere classified  Muscle weakness (generalized)  Localized edema  Other abnormalities of gait and mobility     Problem List Patient Active Problem List   Diagnosis Date Noted  . Status post total right knee replacement 12/22/2015  . Hepatic steatosis 02/09/2015  . Hot flashes 09/09/2014  . Osteopenia 09/09/2014  . Breast cancer of upper-inner quadrant of left female breast ([place]) 07/23/2013  . Memory loss 07/14/2013  . Headache 07/14/2013  . Other malaise and fatigue 07/14/2013  . Acute pulmonary embolism ([place]) 10/07/2011  . Leukocytosis 10/07/2011  . Thyroid mass, left inferior lobe, 3.6cm [id number] 10/07/2011  . Hyponatremia 10/07/2011  . Obesity (BMI 30-39.9) 08/07/2011  . Post concussion syndrome      Sigurd Sos, PT 02/29/16 4:25 PM  [facility] [address] [place], [place],  [address] Phone: [phone]   Fax:  [phone]  Name: Alyssa [name] MRN: [id number] Date of Birth: Jan 01, 1951

## 2016-03-01 ENCOUNTER — Encounter: Payer: Self-pay | Admitting: Physical Therapy

## 2016-03-01 ENCOUNTER — Ambulatory Visit: Payer: BC Managed Care – PPO | Admitting: Physical Therapy

## 2016-03-01 DIAGNOSIS — R600 Localized edema: Secondary | ICD-10-CM

## 2016-03-01 DIAGNOSIS — M25561 Pain in right knee: Secondary | ICD-10-CM

## 2016-03-01 DIAGNOSIS — M25661 Stiffness of right knee, not elsewhere classified: Secondary | ICD-10-CM

## 2016-03-01 DIAGNOSIS — M6281 Muscle weakness (generalized): Secondary | ICD-10-CM

## 2016-03-01 NOTE — Therapy (Signed)
[facility] [address], [place], [place], [address] Phone: [phone]   Fax:  [phone]  Physical Therapy Treatment  Patient Details  Name: Alyssa [name] MRN: [id number] Date of Birth: 04/18/1951 Referring Provider: Rodell Perna, MD  Encounter Date: 03/01/2016      PT End of Session - 03/01/16 1626    Visit Number 18   Date for PT Re-Evaluation 03/08/16   PT Start Time 1610   PT Stop Time 6160   PT Time Calculation (min) 65 min   Activity Tolerance Patient tolerated treatment well      Past Medical History:  Diagnosis Date  . Abdominal pain   . Breast cancer ([place]) 06/2013   left upper inner  . Cancer ([employer])    breast  . Diabetes mellitus without complication ([place])    Takes Tradjenta  . Family history of adverse reaction to anesthesia    Patients mother had to be resusciated after having anesthesia  . GERD (gastroesophageal reflux disease)   . Headache(784.0)    Takes Depakote  . Hernia, incisional periumbilical, incarcerated 7/37/1062  . Hot flashes   . Hx of radiation therapy 09/09/13- 10/13/13   left breast 5000 cGy in 25 sessions, no boost  . Hyperlipidemia   . Hypertension    PCP DR Nolon Rod  . Hypothyroidism   . Peripheral vascular disease ([place])    pulmonary embolis-still have some  . Post concussion syndrome    4 yrs ago- sees [facility]- Dr Leonie Man every 6 months-   has sleep study 2011 following accident but was neg per patient-  short term memory issues, dates and times  . Pulmonary embolism ([place]) 2013  . Recurrent upper respiratory infection (URI)    FLU 08/23/11-08/29/11 then URI 08/29/11- 09/08/11- S/P zpack and steroids-  improved      no cough or congestion  . Thyroid disease   . Thyroid mass, left inferior lobe, 3.6cm [id number] 10/07/2011  . Ventral hernia     Past Surgical History:  Procedure Laterality Date  . APPENDECTOMY  2002   lap appy.  Dr. Hassell Done  . BREAST LUMPECTOMY WITH  NEEDLE LOCALIZATION AND AXILLARY SENTINEL LYMPH NODE BX Left 08/04/2013   Procedure: BREAST LUMPECTOMY WITH NEEDLE LOCALIZATION AND AXILLARY SENTINEL LYMPH NODE Biopsy x 3;  Surgeon: Rolm Bookbinder, MD;  Location: [facility];  Service: General;  Laterality: Left;  . BREAST SURGERY    . CARPAL TUNNEL RELEASE  10/2009-12/2010   left - right  . CESAREAN SECTION  X 2  . COLONOSCOPY    . HERNIA REPAIR  09/15/11   Ventral w/mesh  . JOINT REPLACEMENT    . KNEE ARTHROPLASTY Right 12/22/2015   Procedure: COMPUTER ASSISTED TOTAL KNEE ARTHROPLASTY;  Surgeon: Marybelle Killings, MD;  Location: [facility];  Service: Orthopedics;  Laterality: Right;  . LAPAROSCOPIC CHOLECYSTECTOMY  2006   Dr. Bubba Camp  . TARSAL TUNNEL RELEASE Bilateral   . THYROIDECTOMY, PARTIAL    . VENTRAL HERNIA REPAIR  09/29/2011   Procedure: LAPAROSCOPIC VENTRAL HERNIA;  Surgeon: Adin Hector, MD;  [facility];  Service: General;  Laterality: N/A;  Laparoscopic Ventral Wall Hernia Repair with Mesh    There were no vitals filed for this visit.      Subjective Assessment - 03/01/16 1624    Subjective Knee felt good today at work.    Currently in Pain? Yes   Pain Score 3    Pain Location Knee   Pain  Orientation Right   Pain Descriptors / Indicators Dull   Multiple Pain Sites No            [facility] PT Assessment - 03/01/16 0001      AROM   Right Knee Flexion 90                     [facility] Adult PT Treatment/Exercise - 03/01/16 0001      Knee/Hip Exercises: Aerobic   Recumbent Bike Rocking x 5 min  Did bike after manual which seemed to make bike more effecti   Nustep L2 x10 min     Knee/Hip Exercises: Standing   Forward Step Up Right;2 sets;15 reps;Hand Hold: 1;Step Height: 6"   Step Down Right;2 sets;5 sets;Hand Hold: 2;Step Height: 6"     Knee/Hip Exercises: Seated   Sit to Sand 2 sets;10 reps;without UE support  Used blue Airex in chair secondary to pain today.     Vasopneumatic   Number Minutes  Vasopneumatic  15 minutes   Vasopnuematic Location  Knee   Vasopneumatic Pressure High   Vasopneumatic Temperature  3 flakes     Manual Therapy   Joint Mobilization Mobs with movement with Mulligan strap for flexion   Soft tissue mobilization Distal quad/hamstring and distal scar massage                  PT Short Term Goals - 02/29/16 1543      PT SHORT TERM GOAL #2   Title demonstrate Rt knee AROM flexion to > or = to 90 degrees to improve sitting and negotiating steps without substitution   Status Achieved     PT SHORT TERM GOAL #3   Title wean from walker to use of cane for all distances   Status Achieved     PT SHORT TERM GOAL #4   Title report < or = to 4/10 Rt knee pain with standing and walking   Status Achieved           PT Long Term Goals - 02/29/16 1548      PT LONG TERM GOAL #1   Title be independent in advanced HEP   Time 8   Period Weeks   Status On-going     PT LONG TERM GOAL #3   Title ascend steps with step-over-step with use of 1 rail   Time 8   Period Weeks   Status On-going     PT LONG TERM GOAL #5   Title imrprove strength and endurance to walk for 45 minutes in the community without need for rest   Time 8   Period Weeks   Status On-going               Plan - 03/01/16 1626    Clinical Impression Statement Pt achieved 90 degrees of knee flexion today for the first time. Continues to have increased edema from return to work, but the pain and soreness is starting to lessen a bit.  Began working on step downs today, difficult due to knee stiffness.    Rehab Potential Good   PT Frequency 3x / week   PT Duration 8 weeks   PT Treatment/Interventions ADLs/Self Care Home Management;Cryotherapy;Electrical Stimulation;Moist Heat;Therapeutic exercise;Therapeutic activities;Functional mobility training;Stair training;Gait training;Ultrasound;Balance training;Neuromuscular re-education;Patient/family education;Manual  techniques;Vasopneumatic Device;Taping;Passive range of motion;Scar mobilization   PT Next Visit Plan Knee ROM, stretching, manual techniques for ROM, work on stairs; measure right knee strength; right patella mobilization   Consulted and Agree  with Plan of Care --      Patient will benefit from skilled therapeutic intervention in order to improve the following deficits and impairments:  Abnormal gait, Decreased range of motion, Decreased endurance, Decreased activity tolerance, Pain, Increased edema, Decreased strength, Impaired flexibility  Visit Diagnosis: Pain in right knee  Stiffness of right knee, not elsewhere classified  Muscle weakness (generalized)  Localized edema     Problem List Patient Active Problem List   Diagnosis Date Noted  . Status post total right knee replacement 12/22/2015  . Hepatic steatosis 02/09/2015  . Hot flashes 09/09/2014  . Osteopenia 09/09/2014  . 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 Memory loss 07/14/2013  . Headache 07/14/2013  . Other malaise and fatigue 07/14/2013  . Acute pulmonary embolism ([place]) 10/07/2011  . Leukocytosis 10/07/2011  . Thyroid mass, left inferior lobe, 3.6cm [id number] 10/07/2011  . Hyponatremia 10/07/2011  . Obesity (BMI 30-39.9) 08/07/2011  . Post concussion syndrome     Antanette Richwine, PTA 03/01/2016, 5:01 PM  [facility] [address] [place], [place], [address] Phone: [phone]   Fax:  [phone]  Name: Alyssa [name] MRN: [id number] Date of Birth: 02/07/1951

## 2016-03-02 ENCOUNTER — Encounter: Payer: Self-pay | Admitting: Physical Therapy

## 2016-03-02 ENCOUNTER — Ambulatory Visit: Payer: BC Managed Care – PPO | Admitting: Physical Therapy

## 2016-03-02 DIAGNOSIS — M25661 Stiffness of right knee, not elsewhere classified: Secondary | ICD-10-CM

## 2016-03-02 DIAGNOSIS — M25561 Pain in right knee: Secondary | ICD-10-CM

## 2016-03-02 DIAGNOSIS — M6281 Muscle weakness (generalized): Secondary | ICD-10-CM

## 2016-03-02 DIAGNOSIS — R600 Localized edema: Secondary | ICD-10-CM

## 2016-03-02 DIAGNOSIS — R2689 Other abnormalities of gait and mobility: Secondary | ICD-10-CM

## 2016-03-02 NOTE — Therapy (Signed)
[facility] [address] [place], [place], [address] Phone: [phone]   Fax:  [phone]  Physical Therapy Treatment  Patient Details  Name: Alyssa [name] MRN: [id number] Date of Birth: 02/07/1951 Referring Provider: Rodell Perna, MD  Encounter Date: 03/02/2016      PT End of Session - 03/02/16 1619    Visit Number 19   Date for PT Re-Evaluation 03/08/16   PT Start Time 1530   PT Stop Time 1625   PT Time Calculation (min) 55 min   Activity Tolerance Patient tolerated treatment well   Behavior During Therapy [facility] for tasks assessed/performed      Past Medical History:  Diagnosis Date  . Abdominal pain   . Breast cancer ([place]) 06/2013   left upper inner  . Cancer ([place])    breast  . Diabetes mellitus without complication ([place])    Takes Tradjenta  . Family history of adverse reaction to anesthesia    Patients mother had to be resusciated after having anesthesia  . GERD (gastroesophageal reflux disease)   . Headache(784.0)    Takes Depakote  . Hernia, incisional periumbilical, incarcerated 01/27/2375  . Hot flashes   . Hx of radiation therapy 09/09/13- 10/13/13   left breast 5000 cGy in 25 sessions, no boost  . Hyperlipidemia   . Hypertension    PCP DR Nolon Rod  . Hypothyroidism   . Peripheral vascular disease ([place])    pulmonary embolis-still have some  . Post concussion syndrome    4 yrs ago- sees [facility]- Dr Leonie Man every 6 months-   has sleep study 2011 following accident but was neg per patient-  short term memory issues, dates and times  . Pulmonary embolism ([place]) 2013  . Recurrent upper respiratory infection (URI)    FLU 08/23/11-08/29/11 then URI 08/29/11- 09/08/11- S/P zpack and steroids-  improved      no cough or congestion  . Thyroid disease   . Thyroid mass, left inferior lobe, 3.6cm [id number] 10/07/2011  . Ventral hernia     Past Surgical History:  Procedure Laterality Date  . APPENDECTOMY   2002   lap appy.  Dr. Hassell Done  . BREAST LUMPECTOMY WITH NEEDLE LOCALIZATION AND AXILLARY SENTINEL LYMPH NODE BX Left 08/04/2013   Procedure: BREAST LUMPECTOMY WITH NEEDLE LOCALIZATION AND AXILLARY SENTINEL LYMPH NODE Biopsy x 3;  Surgeon: Rolm Bookbinder, MD;  Location: [facility];  Service: General;  Laterality: Left;  . BREAST SURGERY    . CARPAL TUNNEL RELEASE  10/2009-12/2010   left - right  . CESAREAN SECTION  X 2  . COLONOSCOPY    . HERNIA REPAIR  09/15/11   Ventral w/mesh  . JOINT REPLACEMENT    . KNEE ARTHROPLASTY Right 12/22/2015   Procedure: COMPUTER ASSISTED TOTAL KNEE ARTHROPLASTY;  Surgeon: Marybelle Killings, MD;  Location: [facility];  Service: Orthopedics;  Laterality: Right;  . LAPAROSCOPIC CHOLECYSTECTOMY  2006   Dr. Bubba Camp  . TARSAL TUNNEL RELEASE Bilateral   . THYROIDECTOMY, PARTIAL    . VENTRAL HERNIA REPAIR  09/29/2011   Procedure: LAPAROSCOPIC VENTRAL HERNIA;  Surgeon: Adin Hector, MD;  [facility];  Service: General;  Laterality: N/A;  Laparoscopic Ventral Wall Hernia Repair with Mesh    There were no vitals filed for this visit.      Subjective Assessment - 03/02/16 1544    Subjective I was able to walk a long hallway back and forth without my cane for first time.  Pertinent History history of breast cancer   Limitations Standing;Walking   How long can you walk comfortably? 20 minutes or less   Patient Stated Goals improve flexion, wean from walker, improve strength, reduce pain   Currently in Pain? Yes   Pain Score 3    Pain Location Knee   Pain Orientation Right   Pain Descriptors / Indicators Dull   Pain Type Surgical pain   Pain Onset More than a month ago   Pain Frequency Intermittent   Aggravating Factors  sitting alot, walking u pand down stairs   Pain Relieving Factors change of postions   Multiple Pain Sites No            [facility] PT Assessment - 03/02/16 0001      PROM   Right Knee Flexion 93                     [facility] Adult PT  Treatment/Exercise - 03/02/16 0001      Knee/Hip Exercises: Stretches   Gastroc Stretch 2 reps;30 seconds  on step     Knee/Hip Exercises: Aerobic   Nustep L2 x10 min  no arms     Knee/Hip Exercises: Standing   Forward Step Up Right;2 sets;15 reps;Hand Hold: 1;Step Height: 6"  left hand used fingertips for balance   Step Down Right;2 sets;5 sets;Hand Hold: 2;Step Height: 6"   Step Down Limitations therapist guided knee and ankle    Other Standing Knee Exercises right leg on second step leaning forward 10 times with therapist massaging right quads     Knee/Hip Exercises: Seated   Sit to Sand 1 set;10 reps;without UE support  with 2 inch step under right foot; hover 5 sec      Modalities   Modalities Vasopneumatic     Vasopneumatic   Number Minutes Vasopneumatic  15 minutes   Vasopnuematic Location  Knee   Vasopneumatic Pressure High   Vasopneumatic Temperature  3 flakes     Manual Therapy   Manual Therapy Soft tissue mobilization;Passive ROM   Soft tissue mobilization right quads, around scar, distal right leg   Passive ROM knee flexion in supine                PT Education - 03/02/16 1618    Education provided No          PT Short Term Goals - 02/29/16 1543      PT SHORT TERM GOAL #2   Title demonstrate Rt knee AROM flexion to > or = to 90 degrees to improve sitting and negotiating steps without substitution   Status Achieved     PT SHORT TERM GOAL #3   Title wean from walker to use of cane for all distances   Status Achieved     PT SHORT TERM GOAL #4   Title report < or = to 4/10 Rt knee pain with standing and walking   Status Achieved           PT Long Term Goals - 02/29/16 1548      PT LONG TERM GOAL #1   Title be independent in advanced HEP   Time 8   Period Weeks   Status On-going     PT LONG TERM GOAL #3   Title ascend steps with step-over-step with use of 1 rail   Time 8   Period Weeks   Status On-going     PT LONG TERM GOAL  #5   Title  imrprove strength and endurance to walk for 45 minutes in the community without need for rest   Time 8   Period Weeks   Status On-going               Plan - 03/02/16 1619    Clinical Impression Statement Patient is working on knee ROM with stairs.  Patient works her leg out more not using her arms on nustep. Patient walks with decreased right knee flexion when no tusing her cane.  Patient will benefit from skilled therapy to improve knee ROM and strength.    Rehab Potential Good   PT Frequency 3x / week   PT Duration 8 weeks   PT Treatment/Interventions ADLs/Self Care Home Management;Cryotherapy;Electrical Stimulation;Moist Heat;Therapeutic exercise;Therapeutic activities;Functional mobility training;Stair training;Gait training;Ultrasound;Balance training;Neuromuscular re-education;Patient/family education;Manual techniques;Vasopneumatic Device;Taping;Passive range of motion;Scar mobilization   PT Next Visit Plan Knee ROM, stretching, manual techniques for ROM, work on stairs; measure right knee strength; right patella mobilization; write renewal   PT Home Exercise Plan progress as needed   Consulted and Agree with Plan of Care Patient      Patient will benefit from skilled therapeutic intervention in order to improve the following deficits and impairments:  Abnormal gait, Decreased range of motion, Decreased endurance, Decreased activity tolerance, Pain, Increased edema, Decreased strength, Impaired flexibility  Visit Diagnosis: Pain in right knee  Stiffness of right knee, not elsewhere classified  Muscle weakness (generalized)  Localized edema  Other abnormalities of gait and mobility     Problem List Patient Active Problem List   Diagnosis Date Noted  . Status post total right knee replacement 12/22/2015  . Hepatic steatosis 02/09/2015  . Hot flashes 09/09/2014  . Osteopenia 09/09/2014  . 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 Memory loss 07/14/2013  . Headache 07/14/2013  . Other malaise and fatigue 07/14/2013  . Acute pulmonary embolism ([place]) 10/07/2011  . Leukocytosis 10/07/2011  . Thyroid mass, left inferior lobe, 3.6cm [id number] 10/07/2011  . Hyponatremia 10/07/2011  . Obesity (BMI 30-39.9) 08/07/2011  . Post concussion syndrome     Earlie Counts, PT 03/02/16 4:23 PM   [facility] [address], [place], [place], [address] Phone: [phone]   Fax:  [phone]  Name: Alyssa [name] MRN: [id number] Date of Birth: Oct 28, 1950

## 2016-03-06 ENCOUNTER — Encounter: Payer: Self-pay | Admitting: Physical Therapy

## 2016-03-06 ENCOUNTER — Ambulatory Visit: Payer: BC Managed Care – PPO | Admitting: Physical Therapy

## 2016-03-06 DIAGNOSIS — M25661 Stiffness of right knee, not elsewhere classified: Secondary | ICD-10-CM

## 2016-03-06 DIAGNOSIS — M6281 Muscle weakness (generalized): Secondary | ICD-10-CM

## 2016-03-06 DIAGNOSIS — M25561 Pain in right knee: Secondary | ICD-10-CM | POA: Diagnosis not present

## 2016-03-06 DIAGNOSIS — R600 Localized edema: Secondary | ICD-10-CM

## 2016-03-06 NOTE — Therapy (Signed)
[facility] [address] [place], [place], [address] Phone: [phone]   Fax:  [phone]  Physical Therapy Treatment  Patient Details  Name: Alyssa [name] MRN: [id number] Date of Birth: [birth year] Referring Provider: Rodell Perna, MD  Encounter Date: 03/06/2016      PT End of Session - 03/06/16 1656    Visit Number 20   Date for PT Re-Evaluation 04/19/16   PT Start Time 1600   PT Stop Time 1700   PT Time Calculation (min) 60 min   Activity Tolerance Patient tolerated treatment well   Behavior During Therapy [facility] for tasks assessed/performed      Past Medical History:  Diagnosis Date  . Abdominal pain   . Breast cancer ([place]) 06/2013   left upper inner  . Cancer ([place])    breast  . Diabetes mellitus without complication ([place])    Takes Tradjenta  . Family history of adverse reaction to anesthesia    Patients mother had to be resusciated after having anesthesia  . GERD (gastroesophageal reflux disease)   . Headache(784.0)    Takes Depakote  . Hernia, incisional periumbilical, incarcerated 2/70/6237  . Hot flashes   . Hx of radiation therapy 09/09/13- 10/13/13   left breast 5000 cGy in 25 sessions, no boost  . Hyperlipidemia   . Hypertension    PCP DR Nolon Rod  . Hypothyroidism   . Peripheral vascular disease ([place])    pulmonary embolis-still have some  . Post concussion syndrome    4 yrs ago- sees [facility]- Dr Leonie Man every 6 months-   has sleep study 2011 following accident but was neg per patient-  short term memory issues, dates and times  . Pulmonary embolism ([place]) 2013  . Recurrent upper respiratory infection (URI)    FLU 08/23/11-08/29/11 then URI 08/29/11- 09/08/11- S/P zpack and steroids-  improved      no cough or congestion  . Thyroid disease   . Thyroid mass, left inferior lobe, 3.6cm [id number] 10/07/2011  . Ventral hernia     Past Surgical History:  Procedure Laterality Date  . APPENDECTOMY   2002   lap appy.  Dr. Hassell Done  . BREAST LUMPECTOMY WITH NEEDLE LOCALIZATION AND AXILLARY SENTINEL LYMPH NODE BX Left 08/04/2013   Procedure: BREAST LUMPECTOMY WITH NEEDLE LOCALIZATION AND AXILLARY SENTINEL LYMPH NODE Biopsy x 3;  Surgeon: Rolm Bookbinder, MD;  Location: [place];  Service: General;  Laterality: Left;  . BREAST SURGERY    . CARPAL TUNNEL RELEASE  10/2009-12/2010   left - right  . CESAREAN SECTION  X 2  . COLONOSCOPY    . HERNIA REPAIR  09/15/11   Ventral w/mesh  . JOINT REPLACEMENT    . KNEE ARTHROPLASTY Right 12/22/2015   Procedure: COMPUTER ASSISTED TOTAL KNEE ARTHROPLASTY;  Surgeon: Marybelle Killings, MD;  Location: [place];  Service: Orthopedics;  Laterality: Right;  . LAPAROSCOPIC CHOLECYSTECTOMY  2006   Dr. Bubba Camp  . TARSAL TUNNEL RELEASE Bilateral   . THYROIDECTOMY, PARTIAL    . VENTRAL HERNIA REPAIR  09/29/2011   Procedure: LAPAROSCOPIC VENTRAL HERNIA;  Surgeon: Adin Hector, MD;  [facility];  Service: General;  Laterality: N/A;  Laparoscopic Ventral Wall Hernia Repair with Mesh    There were no vitals filed for this visit.      Subjective Assessment - 03/06/16 1608    Subjective I think last treatment aggravated my left knee.  I walked 4,000 steps today.    Pertinent  History history of breast cancer   Limitations Standing;Walking   How long can you walk comfortably? 20 minutes or less   Patient Stated Goals improve flexion, wean from walker, improve strength, reduce pain   Currently in Pain? Yes   Pain Score 3    Pain Location Knee   Pain Orientation Right   Pain Descriptors / Indicators Dull   Pain Type Surgical pain   Pain Onset More than a month ago   Pain Frequency Intermittent   Aggravating Factors  sitting alot, walking up and down stairs   Pain Relieving Factors change of positions   Effect of Pain on Daily Activities moderate   Multiple Pain Sites No            [facility] PT Assessment - 03/06/16 0001      PROM   Right Knee Extension -10    Right Knee Flexion 93     Strength   Right Knee Flexion 4/5   Right Knee Extension 4/5   Right Ankle Plantar Flexion 3+/5     Palpation   Patella mobility decreased right patella mobility   Palpation comment tenderness located on the right patella; decreased right quad contraction to move right patella with quad set; tightness located in right calf     Ambulation/Gait   Ambulation/Gait Yes   Gait Pattern Decreased stride length;Decreased stance time - right;Decreased weight shift to right;Poor foot clearance - right  decreased push off on right   Stairs Yes   Stair Management Technique Step to pattern                     [facility] Adult PT Treatment/Exercise - 03/06/16 0001      Knee/Hip Exercises: Aerobic   Recumbent Bike Rocking x 8 min  Did bike after manual which seemed to make bike more effecti     Knee/Hip Exercises: Standing   Heel Raises Right;1 set;10 reps;1 second   Forward Step Up Right;2 sets;15 reps;Hand Hold: 1;Step Height: 4"  left hand used fingertips for balance   Forward Step Up Limitations vc to squeeze gluteals and quads   Step Down Right;2 sets;5 sets;Hand Hold: 2;Step Height: 6"   Step Down Limitations therapist guided knee and ankle    Functional Squat 1 set;10 reps;5 seconds  while on flat side of BOSU    Other Standing Knee Exercises right leg on second step leaning forward 10 times with therapist massaging right quads   Other Standing Knee Exercises walk over weights with right leg to increase righ tknee fleixon and foot clearance 8 times; walk with increased pushoff on right for 50 feet     Knee/Hip Exercises: Supine   Other Supine Knee/Hip Exercises bridge 10x 5 sec hold VC to squeeze buttocks     Modalities   Modalities Vasopneumatic     Vasopneumatic   Number Minutes Vasopneumatic  15 minutes   Vasopnuematic Location  Knee   Vasopneumatic Pressure High   Vasopneumatic Temperature  3 flakes     Manual Therapy   Manual Therapy  Soft tissue mobilization;Passive ROM   Manual therapy comments tapping to righ tquad to facilitate contraction with quad set   Soft tissue mobilization right quads, around scar, distal right leg; right calkf   Passive ROM knee fleixon in sitting; knee extension in supine                PT Education - 03/06/16 1655    Education provided Yes  Education Details working on contracting the right gastroc with walking and to clear her right foot   Person(s) Educated Patient   Methods Explanation;Demonstration;Verbal cues   Comprehension Verbalized understanding;Returned demonstration          PT Short Term Goals - 02/29/16 1543      PT SHORT TERM GOAL #2   Title demonstrate Rt knee AROM flexion to > or = to 90 degrees to improve sitting and negotiating steps without substitution   Status Achieved     PT SHORT TERM GOAL #3   Title wean from walker to use of cane for all distances   Status Achieved     PT SHORT TERM GOAL #4   Title report < or = to 4/10 Rt knee pain with standing and walking   Status Achieved           PT Long Term Goals - 03/06/16 1702      PT LONG TERM GOAL #1   Title be independent in advanced HEP   Time 8   Period Weeks   Status On-going     PT LONG TERM GOAL #2   Title reduce FOTO to < or = to 43% limitation   Time 8   Period Weeks   Status On-going     PT LONG TERM GOAL #3   Title ascend steps with step-over-step with use of 1 rail   Time 8   Period Weeks   Status On-going     PT LONG TERM GOAL #4   Title demonstrate Rt knee AROM -5 to 100 degrees to improve mobility without substitution   Time 8   Period Weeks   Status On-going     PT LONG TERM GOAL #5   Title imrprove strength and endurance to walk for 45 minutes in the community without need for rest   Time 8   Period Weeks   Status On-going     PT LONG TERM GOAL #6   Title demonstrate symmetry with ambulation on level surface with use of cane   Time 8   Period Weeks    Status On-going               Plan - 03/06/16 1657    Clinical Impression Statement Patient has 93 degrees of right knee flexion PROM and 70 degrees AROM. Right knee extension PROM is -10 degrees.  Right knee strength is 4/5, right gastroc is 3+/5.  Patient has difficulty with clearing her right foot with walking due to decreased right knee fleixon and gastroc strength. Patient is able to do stairs with step to step pattern.  Patient is able to walk 4,000 steps at work.  Patient will benefit from physical therapy to increase right knee ROM and strength.    Rehab Potential Good   PT Frequency 2x / week   PT Duration 6 weeks   PT Treatment/Interventions ADLs/Self Care Home Management;Cryotherapy;Electrical Stimulation;Moist Heat;Therapeutic exercise;Therapeutic activities;Functional mobility training;Stair training;Gait training;Ultrasound;Balance training;Neuromuscular re-education;Patient/family education;Manual techniques;Vasopneumatic Device;Taping;Passive range of motion;Scar mobilization   PT Next Visit Plan Knee ROM, stretching, manual techniques for ROM, work on stairs; measure right knee strength; right patella mobilization; see what MD says; work on Chartered loss adjuster   PT Home Exercise Plan progress as needed   Consulted and Agree with Plan of Care Patient      Patient will benefit from skilled therapeutic intervention in order to improve the following deficits and impairments:  Abnormal gait, Decreased range of motion, Decreased endurance, Decreased  activity tolerance, Pain, Increased edema, Decreased strength, Impaired flexibility  Visit Diagnosis: Pain in right knee - Plan: PT plan of care cert/re-cert  Stiffness of right knee, not elsewhere classified - Plan: PT plan of care cert/re-cert  Muscle weakness (generalized) - Plan: PT plan of care cert/re-cert  Localized edema - Plan: PT plan of care cert/re-cert     Problem List Patient Active Problem List   Diagnosis  Date Noted  . Status post total right knee replacement 12/22/2015  . Hepatic steatosis 02/09/2015  . Hot flashes 09/09/2014  . Osteopenia 09/09/2014  . Breast cancer of upper-inner quadrant of left female breast ([place]) 07/23/2013  . Memory loss 07/14/2013  . Headache 07/14/2013  . Other malaise and fatigue 07/14/2013  . Acute pulmonary embolism ([place]) 10/07/2011  . Leukocytosis 10/07/2011  . Thyroid mass, left inferior lobe, 3.6cm [id number] 10/07/2011  . Hyponatremia 10/07/2011  . Obesity (BMI 30-39.9) 08/07/2011  . Post concussion syndrome     Earlie Counts, PT 03/06/16 5:06 PM   [facility] [address], [place], [place], [address] Phone: [phone]   Fax:  [phone]  Name: RAELIE LOHR MRN: [id number] Date of Birth: 01/31/1951

## 2016-03-08 ENCOUNTER — Ambulatory Visit: Payer: BC Managed Care – PPO

## 2016-03-08 DIAGNOSIS — R2689 Other abnormalities of gait and mobility: Secondary | ICD-10-CM

## 2016-03-08 DIAGNOSIS — M25561 Pain in right knee: Secondary | ICD-10-CM | POA: Diagnosis not present

## 2016-03-08 DIAGNOSIS — M25661 Stiffness of right knee, not elsewhere classified: Secondary | ICD-10-CM

## 2016-03-08 DIAGNOSIS — M6281 Muscle weakness (generalized): Secondary | ICD-10-CM

## 2016-03-08 DIAGNOSIS — R600 Localized edema: Secondary | ICD-10-CM

## 2016-03-08 NOTE — Therapy (Signed)
[facility] [address] [place], [place], [address] Phone: [phone]   Fax:  [phone]  Physical Therapy Treatment  Patient Details  Name: Alyssa [name] MRN: [id number] Date of Birth: 1951/07/19 Referring Provider: Rodell Perna, MD  Encounter Date: 03/08/2016      PT End of Session - 03/08/16 1611    Visit Number 21   Date for PT Re-Evaluation 04/19/16   PT Start Time 1539  late for appt   PT Stop Time 1625   PT Time Calculation (min) 46 min   Activity Tolerance Patient tolerated treatment well   Behavior During Therapy [facility] for tasks assessed/performed      Past Medical History:  Diagnosis Date  . Abdominal pain   . Breast cancer ([place]) 06/2013   left upper inner  . Cancer ([place])    breast  . Diabetes mellitus without complication ([place])    Takes Tradjenta  . Family history of adverse reaction to anesthesia    Patients mother had to be resusciated after having anesthesia  . GERD (gastroesophageal reflux disease)   . Headache(784.0)    Takes Depakote  . Hernia, incisional periumbilical, incarcerated 10/12/2246  . Hot flashes   . Hx of radiation therapy 09/09/13- 10/13/13   left breast 5000 cGy in 25 sessions, no boost  . Hyperlipidemia   . Hypertension    PCP DR Nolon Rod  . Hypothyroidism   . Peripheral vascular disease ([place])    pulmonary embolis-still have some  . Post concussion syndrome    4 yrs ago- sees [facility]- Dr Leonie Man every 6 months-   has sleep study 2011 following accident but was neg per patient-  short term memory issues, dates and times  . Pulmonary embolism ([place]) 2013  . Recurrent upper respiratory infection (URI)    FLU 08/23/11-08/29/11 then URI 08/29/11- 09/08/11- S/P zpack and steroids-  improved      no cough or congestion  . Thyroid disease   . Thyroid mass, left inferior lobe, 3.6cm [id number] 10/07/2011  . Ventral hernia     Past Surgical History:  Procedure Laterality Date   . APPENDECTOMY  2002   lap appy.  Dr. Hassell Done  . BREAST LUMPECTOMY WITH NEEDLE LOCALIZATION AND AXILLARY SENTINEL LYMPH NODE BX Left 08/04/2013   Procedure: BREAST LUMPECTOMY WITH NEEDLE LOCALIZATION AND AXILLARY SENTINEL LYMPH NODE Biopsy x 3;  Surgeon: Rolm Bookbinder, MD;  Location: [place];  Service: General;  Laterality: Left;  . BREAST SURGERY    . CARPAL TUNNEL RELEASE  10/2009-12/2010   left - right  . CESAREAN SECTION  X 2  . COLONOSCOPY    . HERNIA REPAIR  09/15/11   Ventral w/mesh  . JOINT REPLACEMENT    . KNEE ARTHROPLASTY Right 12/22/2015   Procedure: COMPUTER ASSISTED TOTAL KNEE ARTHROPLASTY;  Surgeon: Marybelle Killings, MD;  Location: [place];  Service: Orthopedics;  Laterality: Right;  . LAPAROSCOPIC CHOLECYSTECTOMY  2006   Dr. Bubba Camp  . TARSAL TUNNEL RELEASE Bilateral   . THYROIDECTOMY, PARTIAL    . VENTRAL HERNIA REPAIR  09/29/2011   Procedure: LAPAROSCOPIC VENTRAL HERNIA;  Surgeon: Adin Hector, MD;  [facility];  Service: General;  Laterality: N/A;  Laparoscopic Ventral Wall Hernia Repair with Mesh    There were no vitals filed for this visit.      Subjective Assessment - 03/08/16 1544    Subjective Came to treatment after work today.  Saw MD and was pleased with progress.  Will see MD again in 2 months.   Currently in Pain? Yes   Pain Score 2    Pain Location Knee   Pain Orientation Right   Pain Descriptors / Indicators Dull   Pain Type Surgical pain   Pain Onset More than a month ago   Pain Frequency Intermittent                         [facility] Adult PT Treatment/Exercise - 03/08/16 0001      Knee/Hip Exercises: Aerobic   Recumbent Bike Rocking x 8 min  Did bike after manual which seemed to make bike more effecti   Nustep L2 x10 min  no arms     Knee/Hip Exercises: Standing   Heel Raises Right;1 set;10 reps;1 second   Forward Step Up Right;2 sets;15 reps;Hand Hold: 1;Step Height: 4"  left hand used fingertips for balance   Forward Step  Up Limitations vc to squeeze gluteals and quads   Step Down Right;2 sets;5 sets;Hand Hold: 2;Step Height: 6"   Step Down Limitations therapist guided knee and ankle    Functional Squat --   Rocker Board 3 minutes   Walking with Sports Cord 20# forward and reverse x10 each   Other Standing Knee Exercises right leg on second step leaning forward 10 times    Other Standing Knee Exercises --     Knee/Hip Exercises: Supine   Other Supine Knee/Hip Exercises bridge 2x10x 5 sec hold VC to squeeze buttocks     Modalities   Modalities Vasopneumatic     Vasopneumatic   Number Minutes Vasopneumatic  15 minutes   Vasopnuematic Location  Knee   Vasopneumatic Pressure High   Vasopneumatic Temperature  3 flakes                  PT Short Term Goals - 02/29/16 1543      PT SHORT TERM GOAL #2   Title demonstrate Rt knee AROM flexion to > or = to 90 degrees to improve sitting and negotiating steps without substitution   Status Achieved     PT SHORT TERM GOAL #3   Title wean from walker to use of cane for all distances   Status Achieved     PT SHORT TERM GOAL #4   Title report < or = to 4/10 Rt knee pain with standing and walking   Status Achieved           PT Long Term Goals - 03/08/16 1546      PT LONG TERM GOAL #1   Title be independent in advanced HEP   Time 8   Period Weeks   Status On-going     PT LONG TERM GOAL #2   Title reduce FOTO to < or = to 43% limitation   Time 8   Period Weeks   Status On-going     PT LONG TERM GOAL #3   Title ascend steps with step-over-step with use of 1 rail   Time 8   Period Weeks   Status On-going     PT LONG TERM GOAL #5   Title imrprove strength and endurance to walk for 45 minutes in the community without need for rest   Time 8   Period Weeks   Status On-going     PT LONG TERM GOAL #6   Title demonstrate symmetry with ambulation on level surface with use of cane   Time 8   Period  Weeks   Status On-going                Plan - 03/08/16 1550    Clinical Impression Statement Pt with 93 degrees Rt knee PROM.  Pt has difficulty clearing her Rt foot with walking to to decreased Rt knee flexion.  Pt is able to ascend steps with step-over-step gait pattern.  Pt will continue to benefit from skilled PT for Rt knee ROM, strength and edema management.     Rehab Potential Good   PT Frequency 2x / week   PT Duration 6 weeks   PT Treatment/Interventions ADLs/Self Care Home Management;Cryotherapy;Electrical Stimulation;Moist Heat;Therapeutic exercise;Therapeutic activities;Functional mobility training;Stair training;Gait training;Ultrasound;Balance training;Neuromuscular re-education;Patient/family education;Manual techniques;Vasopneumatic Device;Taping;Passive range of motion;Scar mobilization   PT Next Visit Plan Knee ROM, stretching, manual techniques for ROM, work on stairs; measure right knee strength; right patella mobilization; see what MD says; work on Product/process development scientist with Plan of Care Patient      Patient will benefit from skilled therapeutic intervention in order to improve the following deficits and impairments:  Abnormal gait, Decreased range of motion, Decreased endurance, Decreased activity tolerance, Pain, Increased edema, Decreased strength, Impaired flexibility  Visit Diagnosis: Pain in right knee  Stiffness of right knee, not elsewhere classified  Muscle weakness (generalized)  Localized edema  Other abnormalities of gait and mobility     Problem List Patient Active Problem List   Diagnosis Date Noted  . Status post total right knee replacement 12/22/2015  . Hepatic steatosis 02/09/2015  . Hot flashes 09/09/2014  . Osteopenia 09/09/2014  . Breast cancer of upper-inner quadrant of left female breast ([place]) 07/23/2013  . Memory loss 07/14/2013  . Headache 07/14/2013  . Other malaise and fatigue 07/14/2013  . Acute pulmonary embolism ([place]) 10/07/2011   . Leukocytosis 10/07/2011  . Thyroid mass, left inferior lobe, 3.6cm [id number] 10/07/2011  . Hyponatremia 10/07/2011  . Obesity (BMI 30-39.9) 08/07/2011  . Post concussion syndrome      Sigurd Sos, PT 03/08/16 4:13 PM  [facility] [address], [place], [place], [address] Phone: [phone]   Fax:  [phone]  Name: Alyssa [name] MRN: [id number] Date of Birth: 1950-10-29

## 2016-03-09 ENCOUNTER — Encounter: Payer: Self-pay | Admitting: Physical Therapy

## 2016-03-09 ENCOUNTER — Ambulatory Visit: Payer: BC Managed Care – PPO | Admitting: Physical Therapy

## 2016-03-09 DIAGNOSIS — M6281 Muscle weakness (generalized): Secondary | ICD-10-CM

## 2016-03-09 DIAGNOSIS — M25561 Pain in right knee: Secondary | ICD-10-CM

## 2016-03-09 DIAGNOSIS — R600 Localized edema: Secondary | ICD-10-CM

## 2016-03-09 DIAGNOSIS — M25661 Stiffness of right knee, not elsewhere classified: Secondary | ICD-10-CM

## 2016-03-09 DIAGNOSIS — R2689 Other abnormalities of gait and mobility: Secondary | ICD-10-CM

## 2016-03-09 NOTE — Therapy (Signed)
[facility] [address], [place], [place], [address] Phone: [phone]   Fax:  [phone]  Physical Therapy Treatment  Patient Details  Name: Alyssa [name] MRN: [id number] Date of Birth: 12/07/1950 Referring Provider: Rodell Perna, MD  Encounter Date: 03/09/2016      PT End of Session - 03/09/16 1612    Visit Number 22   Date for PT Re-Evaluation 04/19/16   PT Start Time 1600   PT Stop Time 1703   PT Time Calculation (min) 63 min   Activity Tolerance Patient tolerated treatment well   Behavior During Therapy [facility] for tasks assessed/performed      Past Medical History:  Diagnosis Date  . Abdominal pain   . Breast cancer ([place]) 06/2013   left upper inner  . Cancer ([place])    breast  . Diabetes mellitus without complication ([place])    Takes Tradjenta  . Family history of adverse reaction to anesthesia    Patients mother had to be resusciated after having anesthesia  . GERD (gastroesophageal reflux disease)   . Headache(784.0)    Takes Depakote  . Hernia, incisional periumbilical, incarcerated 6/96/2952  . Hot flashes   . Hx of radiation therapy 09/09/13- 10/13/13   left breast 5000 cGy in 25 sessions, no boost  . Hyperlipidemia   . Hypertension    PCP DR Nolon Rod  . Hypothyroidism   . Peripheral vascular disease ([place])    pulmonary embolis-still have some  . Post concussion syndrome    4 yrs ago- sees [facility]- Dr Leonie Man every 6 months-   has sleep study 2011 following accident but was neg per patient-  short term memory issues, dates and times  . Pulmonary embolism ([place]) 2013  . Recurrent upper respiratory infection (URI)    FLU 08/23/11-08/29/11 then URI 08/29/11- 09/08/11- S/P zpack and steroids-  improved      no cough or congestion  . Thyroid disease   . 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 Ventral hernia     Past Surgical History:  Procedure Laterality Date  . APPENDECTOMY   2002   lap appy.  Dr. Hassell Done  . BREAST LUMPECTOMY WITH NEEDLE LOCALIZATION AND AXILLARY SENTINEL LYMPH NODE BX Left 08/04/2013   Procedure: BREAST LUMPECTOMY WITH NEEDLE LOCALIZATION AND AXILLARY SENTINEL LYMPH NODE Biopsy x 3;  Surgeon: Rolm Bookbinder, MD;  Location: [place];  Service: General;  Laterality: Left;  . BREAST SURGERY    . CARPAL TUNNEL RELEASE  10/2009-12/2010   left - right  . CESAREAN SECTION  X 2  . COLONOSCOPY    . HERNIA REPAIR  09/15/11   Ventral w/mesh  . JOINT REPLACEMENT    . KNEE ARTHROPLASTY Right 12/22/2015   Procedure: COMPUTER ASSISTED TOTAL KNEE ARTHROPLASTY;  Surgeon: Marybelle Killings, MD;  Location: [facility];  Service: Orthopedics;  Laterality: Right;  . LAPAROSCOPIC CHOLECYSTECTOMY  2006   Dr. Bubba Camp  . TARSAL TUNNEL RELEASE Bilateral   . THYROIDECTOMY, PARTIAL    . VENTRAL HERNIA REPAIR  09/29/2011   Procedure: LAPAROSCOPIC VENTRAL HERNIA;  Surgeon: Adin Hector, MD;  Location: [facility];  Service: General;  Laterality: N/A;  Laparoscopic Ventral Wall Hernia Repair with Mesh    There were no vitals filed for this visit.      Subjective Assessment - 03/08/16 1544    Subjective Came to treatment after work today.  Saw MD and was pleased with progress.  Will see MD  again in 2 months.   Currently in Pain? Yes   Pain Score 2    Pain Location Knee   Pain Orientation Right   Pain Descriptors / Indicators Dull   Pain Type Surgical pain   Pain Onset More than a month ago   Pain Frequency Intermittent                         [facility] Adult PT Treatment/Exercise - 03/09/16 0001      Knee/Hip Exercises: Aerobic   Recumbent Bike Rocking x 8 min  Did bike after manual which seemed to make bike more effecti   Nustep L2 x10 min  no arms     Knee/Hip Exercises: Standing   Heel Raises Both;10 reps;1 second  on step moving through full range   Heel Raises Limitations 10 x on right on floor holding onto railing   Forward Step Up Right;2 sets;15  reps;Hand Hold: 1;Step Height: 4"  left hand used fingertips for balance   Forward Step Up Limitations vc to squeeze gluteals and quads   Step Down Right;2 sets;5 sets;Hand Hold: 2;Step Height: 6"   Step Down Limitations therapist guided knee and ankle    Functional Squat 10 reps;10 seconds  hovering over mat   Walking with Sports Cord 20# forward and reverse x10 each   Other Standing Knee Exercises right leg on second step leaning forward 10 times      Vasopneumatic   Number Minutes Vasopneumatic  15 minutes   Vasopnuematic Location  Knee   Vasopneumatic Pressure High   Vasopneumatic Temperature  3 flakes     Manual Therapy   Manual Therapy Joint mobilization   Joint Mobilization anterior glide of right tibial with right knee flexion and contract relax  Pt. says this is more comfortable                 PT Education - 03/09/16 1651    Education provided No          PT Short Term Goals - 02/29/16 1543      PT SHORT TERM GOAL #2   Title demonstrate Rt knee AROM flexion to > or = to 90 degrees to improve sitting and negotiating steps without substitution   Status Achieved     PT SHORT TERM GOAL #3   Title wean from walker to use of cane for all distances   Status Achieved     PT SHORT TERM GOAL #4   Title report < or = to 4/10 Rt knee pain with standing and walking   Status Achieved           PT Long Term Goals - 03/08/16 1546      PT LONG TERM GOAL #1   Title be independent in advanced HEP   Time 8   Period Weeks   Status On-going     PT LONG TERM GOAL #2   Title reduce FOTO to < or = to 43% limitation   Time 8   Period Weeks   Status On-going     PT LONG TERM GOAL #3   Title ascend steps with step-over-step with use of 1 rail   Time 8   Period Weeks   Status On-going     PT LONG TERM GOAL #5   Title imrprove strength and endurance to walk for 45 minutes in the community without need for rest   Time 8   Period Weeks  Status On-going      PT LONG TERM GOAL #6   Title demonstrate symmetry with ambulation on level surface with use of cane   Time 8   Period Weeks   Status On-going               Plan - 03/09/16 1651    Clinical Impression Statement Patient is able to flex her knee with walking but will sway her trunk side to side when not using the cane.  Patient is able to step down with greater ease. Patient able to do 10 heel raises on the right with several rests due to decreased strength. Patient will benefit from skilled therapy to increase right knee strength and ROM to improve gait   Rehab Potential Good   PT Frequency 2x / week   PT Duration 6 weeks   PT Treatment/Interventions ADLs/Self Care Home Management;Cryotherapy;Electrical Stimulation;Moist Heat;Therapeutic exercise;Therapeutic activities;Functional mobility training;Stair training;Gait training;Ultrasound;Balance training;Neuromuscular re-education;Patient/family education;Manual techniques;Vasopneumatic Device;Taping;Passive range of motion;Scar mobilization   PT Next Visit Plan Knee ROM, stretching, manual techniques for ROM using mulligan belt and contract relax, work on stairs;; right patella mobilization;s; work on [place] progress as needed   Consulted and Agree with Plan of Care Patient      Patient will benefit from skilled therapeutic intervention in order to improve the following deficits and impairments:  Abnormal gait, Decreased range of motion, Decreased endurance, Decreased activity tolerance, Pain, Increased edema, Decreased strength, Impaired flexibility  Visit Diagnosis: Pain in right knee  Stiffness of right knee, not elsewhere classified  Muscle weakness (generalized)  Localized edema  Other abnormalities of gait and mobility     Problem List Patient Active Problem List   Diagnosis Date Noted  . Status post total right knee replacement 12/22/2015  . Hepatic steatosis 02/09/2015  . Hot  flashes 09/09/2014  . Osteopenia 09/09/2014  . 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 Memory loss 07/14/2013  . Headache 07/14/2013  . Other malaise and fatigue 07/14/2013  . Acute pulmonary embolism ([place]) 10/07/2011  . Leukocytosis 10/07/2011  . Thyroid mass, left inferior lobe, 3.6cm [id number] 10/07/2011  . Hyponatremia 10/07/2011  . Obesity (BMI 30-39.9) 08/07/2011  . Post concussion syndrome     Earlie Counts, PT 03/09/16 4:56 PM   [facility] [address], [place], [place], [address] Phone: [phone]   Fax:  [phone]  Name: Alyssa [name] MRN: [id number] Date of Birth: [birth year]

## 2016-03-15 ENCOUNTER — Encounter: Payer: Self-pay | Admitting: Physical Therapy

## 2016-03-16 ENCOUNTER — Encounter: Payer: Self-pay | Admitting: Physical Therapy

## 2016-03-16 ENCOUNTER — Ambulatory Visit: Payer: BC Managed Care – PPO | Admitting: Physical Therapy

## 2016-03-16 DIAGNOSIS — M25561 Pain in right knee: Secondary | ICD-10-CM

## 2016-03-16 DIAGNOSIS — R600 Localized edema: Secondary | ICD-10-CM

## 2016-03-16 DIAGNOSIS — R2689 Other abnormalities of gait and mobility: Secondary | ICD-10-CM

## 2016-03-16 DIAGNOSIS — M25661 Stiffness of right knee, not elsewhere classified: Secondary | ICD-10-CM

## 2016-03-16 DIAGNOSIS — M6281 Muscle weakness (generalized): Secondary | ICD-10-CM

## 2016-03-16 NOTE — Therapy (Signed)
[facility] [address], [place], [place], [address] Phone: [phone]   Fax:  [phone]  Physical Therapy Treatment  Patient Details  Name: Alyssa [name] MRN: [id number] Date of Birth: [birth year] Referring Provider: Rodell Perna, MD  Encounter Date: 03/16/2016      PT End of Session - 03/16/16 1542    Visit Number 23   Date for PT Re-Evaluation 04/19/16   PT Start Time 1534   PT Stop Time 1630   PT Time Calculation (min) 56 min   Activity Tolerance Patient tolerated treatment well   Behavior During Therapy [facility] for tasks assessed/performed      Past Medical History:  Diagnosis Date  . Abdominal pain   . Breast cancer ([place]) 06/2013   left upper inner  . Cancer ([place])    breast  . Diabetes mellitus without complication ([place])    Takes Tradjenta  . Family history of adverse reaction to anesthesia    Patients mother had to be resusciated after having anesthesia  . GERD (gastroesophageal reflux disease)   . Headache(784.0)    Takes Depakote  . Hernia, incisional periumbilical, incarcerated 7/34/1937  . Hot flashes   . Hx of radiation therapy 09/09/13- 10/13/13   left breast 5000 cGy in 25 sessions, no boost  . Hyperlipidemia   . Hypertension    PCP DR Nolon Rod  . Hypothyroidism   . Peripheral vascular disease ([place])    pulmonary embolis-still have some  . Post concussion syndrome    4 yrs ago- sees [facility]- Dr Leonie Man every 6 months-   has sleep study 2011 following accident but was neg per patient-  short term memory issues, dates and times  . Pulmonary embolism ([place]) 2013  . Recurrent upper respiratory infection (URI)    FLU 08/23/11-08/29/11 then URI 08/29/11- 09/08/11- S/P zpack and steroids-  improved      no cough or congestion  . Thyroid disease   . 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 Ventral hernia     Past Surgical History:  Procedure Laterality Date  . APPENDECTOMY   2002   lap appy.  Dr. Hassell Done  . BREAST LUMPECTOMY WITH NEEDLE LOCALIZATION AND AXILLARY SENTINEL LYMPH NODE BX Left 08/04/2013   Procedure: BREAST LUMPECTOMY WITH NEEDLE LOCALIZATION AND AXILLARY SENTINEL LYMPH NODE Biopsy x 3;  Surgeon: Rolm Bookbinder, MD;  Location: [facility];  Service: General;  Laterality: Left;  . BREAST SURGERY    . CARPAL TUNNEL RELEASE  10/2009-12/2010   left - right  . CESAREAN SECTION  X 2  . COLONOSCOPY    . HERNIA REPAIR  09/15/11   Ventral w/mesh  . JOINT REPLACEMENT    . KNEE ARTHROPLASTY Right 12/22/2015   Procedure: COMPUTER ASSISTED TOTAL KNEE ARTHROPLASTY;  Surgeon: Marybelle Killings, MD;  Location: [facility];  Service: Orthopedics;  Laterality: Right;  . LAPAROSCOPIC CHOLECYSTECTOMY  2006   Dr. Bubba Camp  . TARSAL TUNNEL RELEASE Bilateral   . THYROIDECTOMY, PARTIAL    . VENTRAL HERNIA REPAIR  09/29/2011   Procedure: LAPAROSCOPIC VENTRAL HERNIA;  Surgeon: Adin Hector, MD;  [facility];  Service: General;  Laterality: N/A;  Laparoscopic Ventral Wall Hernia Repair with Mesh    There were no vitals filed for this visit.      Subjective Assessment - 03/16/16 1538    Subjective I have not had pain medication in 7 days. Pain is consistent.  I average 4800 to 5800  steps per day. I still use my cane.  I have to consciously have to pick up my leg.    Pertinent History history of breast cancer   Limitations Standing;Walking   How long can you walk comfortably? 20 minutes or less   Patient Stated Goals improve flexion, wean from walker, improve strength, reduce pain   Currently in Pain? Yes   Pain Score 3    Pain Location Knee   Pain Orientation Right   Pain Descriptors / Indicators Dull   Pain Type Surgical pain   Pain Onset More than a month ago   Pain Frequency Intermittent   Aggravating Factors  sitting alot, walking up and down stairs   Pain Relieving Factors change of position   Multiple Pain Sites No            [facility] PT Assessment - 03/16/16 0001       PROM   Right Knee Flexion 97                     [facility] Adult PT Treatment/Exercise - 03/16/16 0001      Knee/Hip Exercises: Aerobic   Elliptical 4 min going back and forth every min on level 1   Recumbent Bike Rocking x 6 min  Did bike after manual which seemed to make bike more effecti     Knee/Hip Exercises: Machines for Strengthening   Cybex Leg Press Seat #7 bil. 90# 20, right 30# 15x     Knee/Hip Exercises: Standing   Heel Raises 10 reps;1 second;Right  on step moving through full range; 2 sets   Walking with Sports Cord 30#  reverse x10 each   Other Standing Knee Exercises stand on right leg holding onto wobble pole and tourch BOSU ball on floor 10x; stand on right foot  with left foot on BOSU ball behind her holding wobble poles with 10 min squats;  step over the prostretch with right leg to increase right hip and knee flexion 10x     Modalities   Modalities Vasopneumatic     Vasopneumatic   Number Minutes Vasopneumatic  15 minutes   Vasopnuematic Location  Knee   Vasopneumatic Pressure High   Vasopneumatic Temperature  3 flakes     Manual Therapy   Manual Therapy Joint mobilization   Joint Mobilization anterior glide of right tibial with right knee flexion and contract relax  Pt. says this is more comfortable                 PT Education - 03/16/16 1610    Education provided No          PT Short Term Goals - 02/29/16 1543      PT SHORT TERM GOAL #2   Title demonstrate Rt knee AROM flexion to > or = to 90 degrees to improve sitting and negotiating steps without substitution   Status Achieved     PT SHORT TERM GOAL #3   Title wean from walker to use of cane for all distances   Status Achieved     PT SHORT TERM GOAL #4   Title report < or = to 4/10 Rt knee pain with standing and walking   Status Achieved           PT Long Term Goals - 03/16/16 1610      PT LONG TERM GOAL #1   Title be independent in advanced HEP   Time  8   Period Weeks  Status On-going     PT LONG TERM GOAL #2   Title reduce FOTO to < or = to 43% limitation   Time 8   Period Weeks   Status On-going     PT LONG TERM GOAL #3   Title ascend steps with step-over-step with use of 1 rail   Time 8   Period Weeks   Status On-going     PT LONG TERM GOAL #4   Title demonstrate Rt knee AROM -5 to 100 degrees to improve mobility without substitution   Time 8   Period Weeks   Status On-going  flexion 97 degrees     PT LONG TERM GOAL #5   Title imrprove strength and endurance to walk for 45 minutes in the community without need for rest   Time 8   Period Weeks   Status On-going     PT LONG TERM GOAL #6   Title demonstrate symmetry with ambulation on level surface with use of cane   Time 8   Period Weeks   Status On-going               Plan - 03/16/16 1611    Clinical Impression Statement Patient has 97 degrees of right knee flexion.  Patient is able to walk with increase fluid movement after being on the elliptical so she will use it at home.  Patient is walking 4800 to 5800 steps per day.  Patient is working on her right calf strength.  Patient needs work on knee flexion with walking.  Patient has not taken pain pills for 7 days.  Patient will benefit from skilled therapy to increase right knee flexion and extension.    Rehab Potential Good   Clinical Impairments Affecting Rehab Potential None   PT Frequency 2x / week   PT Duration 6 weeks   PT Treatment/Interventions ADLs/Self Care Home Management;Cryotherapy;Electrical Stimulation;Moist Heat;Therapeutic exercise;Therapeutic activities;Functional mobility training;Stair training;Gait training;Ultrasound;Balance training;Neuromuscular re-education;Patient/family education;Manual techniques;Vasopneumatic Device;Taping;Passive range of motion;Scar mobilization   PT Next Visit Plan Knee ROM, stretching, manual techniques for ROM using mulligan belt and contract relax, work on  stairs;; right patella mobilization;s; work on [place] progress as needed   Consulted and Agree with Plan of Care Patient      Patient will benefit from skilled therapeutic intervention in order to improve the following deficits and impairments:  Abnormal gait, Decreased range of motion, Decreased endurance, Decreased activity tolerance, Pain, Increased edema, Decreased strength, Impaired flexibility  Visit Diagnosis: Pain in right knee  Stiffness of right knee, not elsewhere classified  Muscle weakness (generalized)  Localized edema  Other abnormalities of gait and mobility     Problem List Patient Active Problem List   Diagnosis Date Noted  . Status post total right knee replacement 12/22/2015  . Hepatic steatosis 02/09/2015  . Hot flashes 09/09/2014  . Osteopenia 09/09/2014  . Breast cancer of upper-inner quadrant of left female breast ([place]) 07/23/2013  . Memory loss 07/14/2013  . Headache 07/14/2013  . Other malaise and fatigue 07/14/2013  . Acute pulmonary embolism ([place]) 10/07/2011  . Leukocytosis 10/07/2011  . Thyroid mass, left inferior lobe, 3.6cm [id number] 10/07/2011  . Hyponatremia 10/07/2011  . Obesity (BMI 30-39.9) 08/07/2011  . Post concussion syndrome     Earlie Counts, PT 03/16/16 4:14 PM    [facility] [address], [place], [place], [address] Phone: [phone]   Fax:  [phone]  Name:  Alyssa [name] MRN: [id number] Date of Birth: Sep 11, 1950

## 2016-03-20 ENCOUNTER — Encounter: Payer: Self-pay | Admitting: Physical Therapy

## 2016-03-21 ENCOUNTER — Encounter: Payer: Self-pay | Admitting: Physical Therapy

## 2016-03-21 ENCOUNTER — Ambulatory Visit: Payer: BC Managed Care – PPO | Admitting: Physical Therapy

## 2016-03-21 DIAGNOSIS — M25661 Stiffness of right knee, not elsewhere classified: Secondary | ICD-10-CM

## 2016-03-21 DIAGNOSIS — R600 Localized edema: Secondary | ICD-10-CM

## 2016-03-21 DIAGNOSIS — M6281 Muscle weakness (generalized): Secondary | ICD-10-CM

## 2016-03-21 DIAGNOSIS — M25561 Pain in right knee: Secondary | ICD-10-CM | POA: Diagnosis not present

## 2016-03-21 DIAGNOSIS — R2689 Other abnormalities of gait and mobility: Secondary | ICD-10-CM

## 2016-03-21 NOTE — Therapy (Signed)
[facility] [address] [place], [place], [address] Phone: [phone]   Fax:  [phone]  Physical Therapy Treatment  Patient Details  Name: Alyssa [name] MRN: [id number] Date of Birth: Jan 07, 1951 Referring Provider: Rodell Perna, MD  Encounter Date: 03/21/2016      PT End of Session - 03/21/16 1633    Visit Number 24   Date for PT Re-Evaluation 04/19/16   PT Start Time 1532   PT Stop Time 1645   PT Time Calculation (min) 73 min   Activity Tolerance Patient tolerated treatment well   Behavior During Therapy [facility] for tasks assessed/performed      Past Medical History:  Diagnosis Date  . Abdominal pain   . Breast cancer ([place]) 06/2013   left upper inner  . Cancer ([place])    breast  . Diabetes mellitus without complication ([place])    Takes Tradjenta  . Family history of adverse reaction to anesthesia    Patients mother had to be resusciated after having anesthesia  . GERD (gastroesophageal reflux disease)   . Headache(784.0)    Takes Depakote  . Hernia, incisional periumbilical, incarcerated 1/82/9937  . Hot flashes   . Hx of radiation therapy 09/09/13- 10/13/13   left breast 5000 cGy in 25 sessions, no boost  . Hyperlipidemia   . Hypertension    PCP DR Nolon Rod  . Hypothyroidism   . Peripheral vascular disease ([place])    pulmonary embolis-still have some  . Post concussion syndrome    4 yrs ago- sees [facility]- Dr Leonie Man every 6 months-   has sleep study 2011 following accident but was neg per patient-  short term memory issues, dates and times  . Pulmonary embolism ([place]) 2013  . Recurrent upper respiratory infection (URI)    FLU 08/23/11-08/29/11 then URI 08/29/11- 09/08/11- S/P zpack and steroids-  improved      no cough or congestion  . Thyroid disease   . Thyroid mass, left inferior lobe, 3.6cm [id number] 10/07/2011  . Ventral hernia     Past Surgical History:  Procedure Laterality Date  . APPENDECTOMY   2002   lap appy.  Dr. Hassell Done  . BREAST LUMPECTOMY WITH NEEDLE LOCALIZATION AND AXILLARY SENTINEL LYMPH NODE BX Left 08/04/2013   Procedure: BREAST LUMPECTOMY WITH NEEDLE LOCALIZATION AND AXILLARY SENTINEL LYMPH NODE Biopsy x 3;  Surgeon: Rolm Bookbinder, MD;  Location: [facility];  Service: General;  Laterality: Left;  . BREAST SURGERY    . CARPAL TUNNEL RELEASE  10/2009-12/2010   left - right  . CESAREAN SECTION  X 2  . COLONOSCOPY    . HERNIA REPAIR  09/15/11   Ventral w/mesh  . JOINT REPLACEMENT    . KNEE ARTHROPLASTY Right 12/22/2015   Procedure: COMPUTER ASSISTED TOTAL KNEE ARTHROPLASTY;  Surgeon: Marybelle Killings, MD;  Location: [facility];  Service: Orthopedics;  Laterality: Right;  . LAPAROSCOPIC CHOLECYSTECTOMY  2006   Dr. Bubba Camp  . TARSAL TUNNEL RELEASE Bilateral   . THYROIDECTOMY, PARTIAL    . VENTRAL HERNIA REPAIR  09/29/2011   Procedure: LAPAROSCOPIC VENTRAL HERNIA;  Surgeon: Adin Hector, MD;  Location: [facility];  Service: General;  Laterality: N/A;  Laparoscopic Ventral Wall Hernia Repair with Mesh    There were no vitals filed for this visit.      Subjective Assessment - 03/21/16 1631    Subjective I have only taking Tylenol.  I am on my 14th day.  I am going up the  22 steps alternating steps but go down step to step.    Pertinent History history of breast cancer   Limitations Standing;Walking   How long can you walk comfortably? 20 minutes or less   Patient Stated Goals improve flexion, wean from walker, improve strength, reduce pain   Currently in Pain? No/denies            [facility] PT Assessment - 03/21/16 0001      PROM   Right Knee Extension -13   Right Knee Flexion 91                     [facility] Adult PT Treatment/Exercise - 03/21/16 0001      Knee/Hip Exercises: Aerobic   Elliptical 4 min going back and forth every min on level 1   Recumbent Bike Rocking x 6 min  Did bike after manual which seemed to make bike more effecti     Knee/Hip Exercises:  Machines for Strengthening   Cybex Leg Press Seat #7 bil. 90# 30, right 30# 15x     Knee/Hip Exercises: Standing   Heel Raises 10 reps;1 second;Right;2 sets  on step moving through full range; 2 sets   Walking with Sports Cord 30#  reverse x10 each   Other Standing Knee Exercises stand on right leg holding onto wobble pole and tourch BOSU ball on floor 10x; stand on right foot  with left foot on BOSU ball behind her holding wobble poles with 10 min squats;  step over the prostretch with right leg to increase right hip and knee flexion 10x     Modalities   Modalities Vasopneumatic     Vasopneumatic   Number Minutes Vasopneumatic  15 minutes   Vasopnuematic Location  Knee   Vasopneumatic Pressure High   Vasopneumatic Temperature  3 flakes     Manual Therapy   Manual Therapy Soft tissue mobilization;Passive ROM   Soft tissue mobilization right quads, hamstring, anterior tibialis, edema massage for swelling                PT Education - 03/21/16 1633    Education provided No          PT Short Term Goals - 02/29/16 1543      PT SHORT TERM GOAL #2   Title demonstrate Rt knee AROM flexion to > or = to 90 degrees to improve sitting and negotiating steps without substitution   Status Achieved     PT SHORT TERM GOAL #3   Title wean from walker to use of cane for all distances   Status Achieved     PT SHORT TERM GOAL #4   Title report < or = to 4/10 Rt knee pain with standing and walking   Status Achieved           PT Long Term Goals - 03/21/16 1636      PT LONG TERM GOAL #1   Title be independent in advanced HEP   Time 8   Status On-going     PT LONG TERM GOAL #2   Title reduce FOTO to < or = to 43% limitation   Time 8   Period Weeks   Status On-going     PT LONG TERM GOAL #3   Title ascend steps with step-over-step with use of 1 rail   Time 8   Period Weeks   Status On-going     PT LONG TERM GOAL #4   Title demonstrate Rt knee  AROM -5 to 100  degrees to improve mobility without substitution   Time 8   Period Weeks     PT LONG TERM GOAL #5   Title imrprove strength and endurance to walk for 45 minutes in the community without need for rest   Time 8   Period Weeks   Status Achieved     PT LONG TERM GOAL #6   Title demonstrate symmetry with ambulation on level surface with use of cane   Period Weeks   Status On-going               Plan - 03/21/16 1633    Clinical Impression Statement Patient is able to go up steps with step over step patter.  Patient has to go down steps with step to step pattern. Patient is not able to go around with the bike.  Patient right knee is stiff today. Patient is only taking Tylenol for the past 14 days. Patient will benefit from skilled therapy to increase right knee ROM and strength.    Rehab Potential Good   Clinical Impairments Affecting Rehab Potential None   PT Frequency 2x / week   PT Duration 6 weeks   PT Treatment/Interventions ADLs/Self Care Home Management;Cryotherapy;Electrical Stimulation;Moist Heat;Therapeutic exercise;Therapeutic activities;Functional mobility training;Stair training;Gait training;Ultrasound;Balance training;Neuromuscular re-education;Patient/family education;Manual techniques;Vasopneumatic Device;Taping;Passive range of motion;Scar mobilization   PT Next Visit Plan Knee ROM, stretching, manual techniques for ROM using mulligan belt and contract relax, work on stairs;; right patella mobilization;s; work on [place] progress as needed   Consulted and Agree with Plan of Care Patient      Patient will benefit from skilled therapeutic intervention in order to improve the following deficits and impairments:  Abnormal gait, Decreased range of motion, Decreased endurance, Decreased activity tolerance, Pain, Increased edema, Decreased strength, Impaired flexibility  Visit Diagnosis: Pain in right knee  Stiffness of right knee, not  elsewhere classified  Muscle weakness (generalized)  Localized edema  Other abnormalities of gait and mobility     Problem List Patient Active Problem List   Diagnosis Date Noted  . Status post total right knee replacement 12/22/2015  . Hepatic steatosis 02/09/2015  . Hot flashes 09/09/2014  . Osteopenia 09/09/2014  . Breast cancer of upper-inner quadrant of left female breast ([place]) 07/23/2013  . Memory loss 07/14/2013  . Headache 07/14/2013  . Other malaise and fatigue 07/14/2013  . Acute pulmonary embolism ([place]) 10/07/2011  . Leukocytosis 10/07/2011  . Thyroid mass, left inferior lobe, 3.6cm [id number] 10/07/2011  . Hyponatremia 10/07/2011  . Obesity (BMI 30-39.9) 08/07/2011  . Post concussion syndrome     Earlie Counts, PT 03/21/16 4:39 PM   [facility] [address] [place], [place], [address] Phone: [phone]   Fax:  [phone]  Name: RUMOR SUN MRN: [id number] Date of Birth: 1950/08/20

## 2016-03-22 ENCOUNTER — Ambulatory Visit: Payer: BC Managed Care – PPO | Admitting: Physical Therapy

## 2016-03-22 DIAGNOSIS — M25561 Pain in right knee: Secondary | ICD-10-CM

## 2016-03-22 DIAGNOSIS — M6281 Muscle weakness (generalized): Secondary | ICD-10-CM

## 2016-03-22 DIAGNOSIS — R2689 Other abnormalities of gait and mobility: Secondary | ICD-10-CM

## 2016-03-22 DIAGNOSIS — M25661 Stiffness of right knee, not elsewhere classified: Secondary | ICD-10-CM

## 2016-03-22 DIAGNOSIS — R600 Localized edema: Secondary | ICD-10-CM

## 2016-03-22 NOTE — Therapy (Signed)
[facility] [address], [place], [place], [address] Phone: [phone]   Fax:  [phone]  Physical Therapy Treatment  Patient Details  Name: Alyssa [name] MRN: [id number] Date of Birth: [birth year] Referring Provider: Rodell Perna, MD  Encounter Date: 03/22/2016      PT End of Session - 03/22/16 1610    Visit Number 25   PT Start Time 1532   PT Stop Time 1625   PT Time Calculation (min) 53 min   Activity Tolerance Patient tolerated treatment well   Behavior During Therapy [facility] for tasks assessed/performed      Past Medical History:  Diagnosis Date  . Abdominal pain   . Breast cancer ([place]) 06/2013   left upper inner  . Cancer ([place])    breast  . Diabetes mellitus without complication ([place])    Takes Tradjenta  . Family history of adverse reaction to anesthesia    Patients mother had to be resusciated after having anesthesia  . GERD (gastroesophageal reflux disease)   . Headache(784.0)    Takes Depakote  . Hernia, incisional periumbilical, incarcerated 03/06/4817  . Hot flashes   . Hx of radiation therapy 09/09/13- 10/13/13   left breast 5000 cGy in 25 sessions, no boost  . Hyperlipidemia   . Hypertension    PCP DR Nolon Rod  . Hypothyroidism   . Peripheral vascular disease ([place])    pulmonary embolis-still have some  . Post concussion syndrome    4 yrs ago- sees [facility]- Dr Leonie Man every 6 months-   has sleep study 2011 following accident but was neg per patient-  short term memory issues, dates and times  . Pulmonary embolism ([place]) 2013  . Recurrent upper respiratory infection (URI)    FLU 08/23/11-08/29/11 then URI 08/29/11- 09/08/11- S/P zpack and steroids-  improved      no cough or congestion  . Thyroid disease   . Thyroid mass, left inferior lobe, 3.6cm [id number] 10/07/2011  . Ventral hernia     Past Surgical History:  Procedure Laterality Date  . APPENDECTOMY  2002   lap appy.  Dr. Hassell Done  .  BREAST LUMPECTOMY WITH NEEDLE LOCALIZATION AND AXILLARY SENTINEL LYMPH NODE BX Left 08/04/2013   Procedure: BREAST LUMPECTOMY WITH NEEDLE LOCALIZATION AND AXILLARY SENTINEL LYMPH NODE Biopsy x 3;  Surgeon: Rolm Bookbinder, MD;  Location: [facility];  Service: General;  Laterality: Left;  . BREAST SURGERY    . CARPAL TUNNEL RELEASE  10/2009-12/2010   left - right  . CESAREAN SECTION  X 2  . COLONOSCOPY    . HERNIA REPAIR  09/15/11   Ventral w/mesh  . JOINT REPLACEMENT    . KNEE ARTHROPLASTY Right 12/22/2015   Procedure: COMPUTER ASSISTED TOTAL KNEE ARTHROPLASTY;  Surgeon: Marybelle Killings, MD;  Location: [facility];  Service: Orthopedics;  Laterality: Right;  . LAPAROSCOPIC CHOLECYSTECTOMY  2006   Dr. Bubba Camp  . TARSAL TUNNEL RELEASE Bilateral   . THYROIDECTOMY, PARTIAL    . VENTRAL HERNIA REPAIR  09/29/2011   Procedure: LAPAROSCOPIC VENTRAL HERNIA;  Surgeon: Adin Hector, MD;  [facility];  Service: General;  Laterality: N/A;  Laparoscopic Ventral Wall Hernia Repair with Mesh    There were no vitals filed for this visit.      Subjective Assessment - 03/22/16 1548    Subjective I was sore from yesterday. It was muscle soreness.    Pertinent History history of breast cancer   Limitations Standing;Walking   How  long can you walk comfortably? 20 minutes or less   Patient Stated Goals improve flexion, wean from walker, improve strength, reduce pain   Currently in Pain? No/denies            [facility] PT Assessment - 03/22/16 0001      PROM   Right Knee Extension -8   Right Knee Flexion 100                     [facility] Adult PT Treatment/Exercise - 03/22/16 0001      Knee/Hip Exercises: Machines for Strengthening   Cybex Leg Press Seat #7 bil. 95# 30x  right 30# 30x     Knee/Hip Exercises: Standing   Heel Raises Right;5 reps;3 sets  with hands on rails   Forward Step Up 15 reps;Right;Step Height: 8";Hand Hold: 1  vc to go slow and no little jump   Other Standing Knee  Exercises stand on vestibular board wiht toes pointed downward and mini squat 30 sec 4 times     Knee/Hip Exercises: Seated   Hamstring Curl Strengthening;10 reps;2 sets  30#, down with bil. up with right      Modalities   Modalities Vasopneumatic     Vasopneumatic   Number Minutes Vasopneumatic  15 minutes   Vasopnuematic Location  Knee   Vasopneumatic Pressure High   Vasopneumatic Temperature  3 flakes     Manual Therapy   Manual Therapy Joint mobilization   Joint Mobilization anterior glide of right tibial with right knee flexion and contract relax  Pt. says this is more comfortable    Passive ROM knee flexion and extension                PT Education - 03/21/16 1633    Education provided No          PT Short Term Goals - 02/29/16 1543      PT SHORT TERM GOAL #2   Title demonstrate Rt knee AROM flexion to > or = to 90 degrees to improve sitting and negotiating steps without substitution   Status Achieved     PT SHORT TERM GOAL #3   Title wean from walker to use of cane for all distances   Status Achieved     PT SHORT TERM GOAL #4   Title report < or = to 4/10 Rt knee pain with standing and walking   Status Achieved           PT Long Term Goals - 03/21/16 1636      PT LONG TERM GOAL #1   Title be independent in advanced HEP   Time 8   Status On-going     PT LONG TERM GOAL #2   Title reduce FOTO to < or = to 43% limitation   Time 8   Period Weeks   Status On-going     PT LONG TERM GOAL #3   Title ascend steps with step-over-step with use of 1 rail   Time 8   Period Weeks   Status On-going     PT LONG TERM GOAL #4   Title demonstrate Rt knee AROM -5 to 100 degrees to improve mobility without substitution   Time 8   Period Weeks     PT LONG TERM GOAL #5   Title imrprove strength and endurance to walk for 45 minutes in the community without need for rest   Time 8   Period Weeks   Status Achieved  PT LONG TERM GOAL #6   Title  demonstrate symmetry with ambulation on level surface with use of cane   Period Weeks   Status On-going               Plan - 03/22/16 1611    Clinical Impression Statement Patient has increased ROM of right knee.  Patient is able to do heel raises on right foot first time.  Patient able to do hamstring curl for first time.  Patient will benefit from physical therapy to improve right knee ROM and strength.    Rehab Potential Good   Clinical Impairments Affecting Rehab Potential None   PT Frequency 2x / week   PT Duration 6 weeks   PT Treatment/Interventions ADLs/Self Care Home Management;Cryotherapy;Electrical Stimulation;Moist Heat;Therapeutic exercise;Therapeutic activities;Functional mobility training;Stair training;Gait training;Ultrasound;Balance training;Neuromuscular re-education;Patient/family education;Manual techniques;Vasopneumatic Device;Taping;Passive range of motion;Scar mobilization   PT Next Visit Plan Knee ROM, stretching, manual techniques for ROM using mulligan belt and contract relax, work on stairs;; right patella mobilization;s; work on [place] progress as needed   Consulted and Agree with Plan of Care Patient      Patient will benefit from skilled therapeutic intervention in order to improve the following deficits and impairments:  Abnormal gait, Decreased range of motion, Decreased endurance, Decreased activity tolerance, Pain, Increased edema, Decreased strength, Impaired flexibility  Visit Diagnosis: Pain in right knee  Muscle weakness (generalized)  Localized edema  Stiffness of right knee, not elsewhere classified  Other abnormalities of gait and mobility     Problem List Patient Active Problem List   Diagnosis Date Noted  . Status post total right knee replacement 12/22/2015  . Hepatic steatosis 02/09/2015  . Hot flashes 09/09/2014  . Osteopenia 09/09/2014  . Breast cancer of upper-inner quadrant of left  female breast ([place]) 07/23/2013  . Memory loss 07/14/2013  . Headache 07/14/2013  . Other malaise and fatigue 07/14/2013  . Acute pulmonary embolism ([place]) 10/07/2011  . Leukocytosis 10/07/2011  . Thyroid mass, left inferior lobe, 3.6cm [id number] 10/07/2011  . Hyponatremia 10/07/2011  . Obesity (BMI 30-39.9) 08/07/2011  . Post concussion syndrome     Earlie Counts, PT 03/22/16 4:14 PM   [facility] [address] [place], [place], [address] Phone: [phone]   Fax:  [phone]  Name: Alyssa [name] MRN: [id number] Date of Birth: 09/18/1950

## 2016-03-23 ENCOUNTER — Ambulatory Visit: Payer: BC Managed Care – PPO | Admitting: Physical Therapy

## 2016-03-23 DIAGNOSIS — M25661 Stiffness of right knee, not elsewhere classified: Secondary | ICD-10-CM

## 2016-03-23 DIAGNOSIS — M25561 Pain in right knee: Secondary | ICD-10-CM | POA: Diagnosis not present

## 2016-03-23 DIAGNOSIS — R600 Localized edema: Secondary | ICD-10-CM

## 2016-03-23 DIAGNOSIS — M6281 Muscle weakness (generalized): Secondary | ICD-10-CM

## 2016-03-23 DIAGNOSIS — R2689 Other abnormalities of gait and mobility: Secondary | ICD-10-CM

## 2016-03-23 NOTE — Therapy (Signed)
[facility] [address] [place], [place], [address] Phone: [phone]   Fax:  [phone]  Physical Therapy Treatment  Patient Details  Name: Alyssa [name] MRN: [id number] Date of Birth: Dec 02, 1950 Referring Provider: Rodell Perna, MD  Encounter Date: 03/23/2016      PT End of Session - 03/23/16 1945    Visit Number 26   Date for PT Re-Evaluation 04/19/16   PT Start Time 1530   PT Stop Time 1630   PT Time Calculation (min) 60 min   Activity Tolerance Patient tolerated treatment well      Past Medical History:  Diagnosis Date  . Abdominal pain   . Breast cancer ([place]) 06/2013   left upper inner  . Cancer ([place])    breast  . Diabetes mellitus without complication ([place])    Takes Tradjenta  . Family history of adverse reaction to anesthesia    Patients mother had to be resusciated after having anesthesia  . GERD (gastroesophageal reflux disease)   . Headache(784.0)    Takes Depakote  . Hernia, incisional periumbilical, incarcerated 7/82/4235  . Hot flashes   . Hx of radiation therapy 09/09/13- 10/13/13   left breast 5000 cGy in 25 sessions, no boost  . Hyperlipidemia   . Hypertension    PCP DR Nolon Rod  . Hypothyroidism   . Peripheral vascular disease ([place])    pulmonary embolis-still have some  . Post concussion syndrome    4 yrs ago- sees [facility]- Dr Leonie Man every 6 months-   has sleep study 2011 following accident but was neg per patient-  short term memory issues, dates and times  . Pulmonary embolism ([place]) 2013  . Recurrent upper respiratory infection (URI)    FLU 08/23/11-08/29/11 then URI 08/29/11- 09/08/11- S/P zpack and steroids-  improved      no cough or congestion  . Thyroid disease   . Thyroid mass, left inferior lobe, 3.6cm [id number] 10/07/2011  . Ventral hernia     Past Surgical History:  Procedure Laterality Date  . APPENDECTOMY  2002   lap appy.  Dr. Hassell Done  . BREAST LUMPECTOMY WITH  NEEDLE LOCALIZATION AND AXILLARY SENTINEL LYMPH NODE BX Left 08/04/2013   Procedure: BREAST LUMPECTOMY WITH NEEDLE LOCALIZATION AND AXILLARY SENTINEL LYMPH NODE Biopsy x 3;  Surgeon: Rolm Bookbinder, MD;  Location: [facility];  Service: General;  Laterality: Left;  . BREAST SURGERY    . CARPAL TUNNEL RELEASE  10/2009-12/2010   left - right  . CESAREAN SECTION  X 2  . COLONOSCOPY    . HERNIA REPAIR  09/15/11   Ventral w/mesh  . JOINT REPLACEMENT    . KNEE ARTHROPLASTY Right 12/22/2015   Procedure: COMPUTER ASSISTED TOTAL KNEE ARTHROPLASTY;  Surgeon: Marybelle Killings, MD;  Location: [facility];  Service: Orthopedics;  Laterality: Right;  . LAPAROSCOPIC CHOLECYSTECTOMY  2006   Dr. Bubba Camp  . TARSAL TUNNEL RELEASE Bilateral   . THYROIDECTOMY, PARTIAL    . VENTRAL HERNIA REPAIR  09/29/2011   Procedure: LAPAROSCOPIC VENTRAL HERNIA;  Surgeon: Adin Hector, MD;  Location: [facility];  Service: General;  Laterality: N/A;  Laparoscopic Ventral Wall Hernia Repair with Mesh    There were no vitals filed for this visit.      Subjective Assessment - 03/23/16 1539    Subjective I did 6400 steps yesterday.  Using [place] minimally.  Someone noticed at work that I am walking better.     Currently in Pain? Yes  Pain Score 2    Pain Location Knee   Pain Orientation Right                         [facility] Adult PT Treatment/Exercise - 03/23/16 0001      Knee/Hip Exercises: Stretches   Other Knee/Hip Stretches on stair step flexion and extension 10x each     Knee/Hip Exercises: Aerobic   Recumbent Bike Rocking x 6 min       Knee/Hip Exercises: Standing   Step Down Left;1 set;15 reps;Hand Hold: 1;Step Height: 4"   SLS SLS on right, left on BOSU with red band UE diagonals 20x   Other Standing Knee Exercises ladder high stepping 4x     Knee/Hip Exercises: Seated   Hamstring Curl Strengthening;10 reps;2 sets  30#, down with bil. up with right      Vasopneumatic   Number Minutes Vasopneumatic  15  minutes   Vasopnuematic Location  Knee   Vasopneumatic Pressure High   Vasopneumatic Temperature  3 flakes     Manual Therapy   Joint Mobilization anterior glide of right tibial with right knee flexion and contract relax  Pt. says this is more comfortable    Soft tissue mobilization right quads, hamstring, anterior tibialis, edema massage for swelling   Passive ROM knee flexion and extension                  PT Short Term Goals - 03/23/16 1948      PT SHORT TERM GOAL #1   Title be independent in initial HEP   Status Achieved     PT SHORT TERM GOAL #2   Title demonstrate Rt knee AROM flexion to > or = to 90 degrees to improve sitting and negotiating steps without substitution   Status Achieved     PT SHORT TERM GOAL #3   Title wean from walker to use of cane for all distances   Status Achieved     PT SHORT TERM GOAL #4   Title report < or = to 4/10 Rt knee pain with standing and walking   Status Achieved           PT Long Term Goals - 03/23/16 1948      PT LONG TERM GOAL #1   Title be independent in advanced HEP   Time 8   Period Weeks   Status On-going     PT LONG TERM GOAL #2   Title reduce FOTO to < or = to 43% limitation   Time 8   Period Weeks   Status On-going     PT LONG TERM GOAL #3   Title ascend steps with step-over-step with use of 1 rail   Time 8   Period Weeks   Status On-going     PT LONG TERM GOAL #4   Title demonstrate Rt knee AROM -5 to 100 degrees to improve mobility without substitution   Time 8   Period Weeks   Status On-going     PT LONG TERM GOAL #5   Title imrprove strength and endurance to walk for 45 minutes in the community without need for rest   Status Achieved     PT LONG TERM GOAL #6   Title demonstrate symmetry with ambulation on level surface with use of cane   Time 8   Period Weeks   Status On-going  Plan - 03/23/16 1945    Clinical Impression Statement Improved with knee flexion  and extension ROM.  Decreased eccentric quad motor control with decending steps.  Much improved pain intensity as patient reports no pain medicine in 2 weeks.  Mild limp without cane.  Quick muscular fatigue with single leg standing.  Therapist closely monitoring response with all interventions.     PT Next Visit Plan Knee ROM, stretching, manual techniques for ROM using mulligan belt and contract relax, work on stairs;; right patella mobilization;s; work on Chartered loss adjuster      Patient will benefit from skilled therapeutic intervention in order to improve the following deficits and impairments:     Visit Diagnosis: Pain in right knee  Muscle weakness (generalized)  Localized edema  Stiffness of right knee, not elsewhere classified  Other abnormalities of gait and mobility     Problem List Patient Active Problem List   Diagnosis Date Noted  . Status post total right knee replacement 12/22/2015  . Hepatic steatosis 02/09/2015  . Hot flashes 09/09/2014  . Osteopenia 09/09/2014  . Breast cancer of upper-inner quadrant of left female breast ([place]) 07/23/2013  . Memory loss 07/14/2013  . Headache 07/14/2013  . Other malaise and fatigue 07/14/2013  . Acute pulmonary embolism ([place]) 10/07/2011  . Leukocytosis 10/07/2011  . Thyroid mass, left inferior lobe, 3.6cm [id number] 10/07/2011  . Hyponatremia 10/07/2011  . Obesity (BMI 30-39.9) 08/07/2011  . Post concussion syndrome    Ruben Im, PT 03/23/16 7:50 PM Phone: [phone] Fax: [phone]  Alvera Singh 03/23/2016, 7:49 PM  [facility] [address], [place], [place], [address] Phone: [phone]   Fax:  [phone]  Name: Alyssa [name] MRN: [id number] Date of Birth: 12/03/1950

## 2016-03-29 ENCOUNTER — Ambulatory Visit: Payer: BC Managed Care – PPO | Attending: Orthopaedic Surgery | Admitting: Physical Therapy

## 2016-03-29 ENCOUNTER — Encounter: Payer: Self-pay | Admitting: Physical Therapy

## 2016-03-29 DIAGNOSIS — R600 Localized edema: Secondary | ICD-10-CM | POA: Diagnosis present

## 2016-03-29 DIAGNOSIS — M25561 Pain in right knee: Secondary | ICD-10-CM

## 2016-03-29 DIAGNOSIS — M6281 Muscle weakness (generalized): Secondary | ICD-10-CM

## 2016-03-29 DIAGNOSIS — R2689 Other abnormalities of gait and mobility: Secondary | ICD-10-CM

## 2016-03-29 DIAGNOSIS — M25661 Stiffness of right knee, not elsewhere classified: Secondary | ICD-10-CM | POA: Diagnosis present

## 2016-03-29 NOTE — Therapy (Signed)
[facility] [address] [place], [place], [address] Phone: [phone]   Fax:  [phone]  Physical Therapy Treatment  Patient Details  Name: Alyssa [name] MRN: [id number] Date of Birth: 07/26/1950 Referring Provider: Rodell Perna, MD  Encounter Date: 03/29/2016      PT End of Session - 03/29/16 1652    Visit Number 27   Date for PT Re-Evaluation 04/19/16   PT Start Time 1600   PT Stop Time 1705   PT Time Calculation (min) 65 min   Activity Tolerance Patient tolerated treatment well   Behavior During Therapy [facility] for tasks assessed/performed      Past Medical History:  Diagnosis Date  . Abdominal pain   . Breast cancer ([place]) 06/2013   left upper inner  . Cancer ([place])    breast  . Diabetes mellitus without complication ([place])    Takes Tradjenta  . Family history of adverse reaction to anesthesia    Patients mother had to be resusciated after having anesthesia  . GERD (gastroesophageal reflux disease)   . Headache(784.0)    Takes Depakote  . Hernia, incisional periumbilical, incarcerated 7/90/3833  . Hot flashes   . Hx of radiation therapy 09/09/13- 10/13/13   left breast 5000 cGy in 25 sessions, no boost  . Hyperlipidemia   . Hypertension    PCP DR Nolon Rod  . Hypothyroidism   . Peripheral vascular disease ([place])    pulmonary embolis-still have some  . Post concussion syndrome    4 yrs ago- sees [facility]- Dr Leonie Man every 6 months-   has sleep study 2011 following accident but was neg per patient-  short term memory issues, dates and times  . Pulmonary embolism ([place]) 2013  . Recurrent upper respiratory infection (URI)    FLU 08/23/11-08/29/11 then URI 08/29/11- 09/08/11- S/P zpack and steroids-  improved      no cough or congestion  . Thyroid disease   . 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 Ventral hernia     Past Surgical History:  Procedure Laterality Date  . APPENDECTOMY   2002   lap appy.  Dr. Hassell Done  . BREAST LUMPECTOMY WITH NEEDLE LOCALIZATION AND AXILLARY SENTINEL LYMPH NODE BX Left 08/04/2013   Procedure: BREAST LUMPECTOMY WITH NEEDLE LOCALIZATION AND AXILLARY SENTINEL LYMPH NODE Biopsy x 3;  Surgeon: Rolm Bookbinder, MD;  Location: [facility];  Service: General;  Laterality: Left;  . BREAST SURGERY    . CARPAL TUNNEL RELEASE  10/2009-12/2010   left - right  . CESAREAN SECTION  X 2  . COLONOSCOPY    . HERNIA REPAIR  09/15/11   Ventral w/mesh  . JOINT REPLACEMENT    . KNEE ARTHROPLASTY Right 12/22/2015   Procedure: COMPUTER ASSISTED TOTAL KNEE ARTHROPLASTY;  Surgeon: Marybelle Killings, MD;  Location: [facility];  Service: Orthopedics;  Laterality: Right;  . LAPAROSCOPIC CHOLECYSTECTOMY  2006   Dr. Bubba Camp  . TARSAL TUNNEL RELEASE Bilateral   . THYROIDECTOMY, PARTIAL    . VENTRAL HERNIA REPAIR  09/29/2011   Procedure: LAPAROSCOPIC VENTRAL HERNIA;  Surgeon: Adin Hector, MD;  [facility];  Service: General;  Laterality: N/A;  Laparoscopic Ventral Wall Hernia Repair with Mesh    There were no vitals filed for this visit.      Subjective Assessment - 03/29/16 1623    Subjective Pt is walking without cane more throughout the day.    Pertinent History history of breast cancer  Limitations Standing;Walking   How long can you walk comfortably? 20 minutes or less   Patient Stated Goals improve flexion, wean from walker, improve strength, reduce pain   Currently in Pain? No/denies   Multiple Pain Sites No                         [facility] Adult PT Treatment/Exercise - 03/29/16 0001      Knee/Hip Exercises: Stretches   Passive Hamstring Stretch Right;3 reps;10 seconds   Quad Stretch Right;10 seconds;5 reps     Knee/Hip Exercises: Aerobic   Recumbent Bike Rocking x 6 min  Did bike after manual which seemed to make bike more effecti     Knee/Hip Exercises: Field seismologist for Strengthening   Cybex Leg Press Seat #7 bil. 95# 30x  right 30# 30x      Knee/Hip Exercises: Standing   Step Down Left;1 set;15 reps;Hand Hold: 1;Step Height: 4"   SLS SLS on right, left on BOSU with red band UE diagonals 20x   Other Standing Knee Exercises Standing TKE red band  20 tactile cueing hip stablizing     Knee/Hip Exercises: Seated   Hamstring Curl Strengthening;10 reps;2 sets  30#, down with bil. up with right      Vasopneumatic   Number Minutes Vasopneumatic  15 minutes   Vasopnuematic Location  Knee   Vasopneumatic Pressure High   Vasopneumatic Temperature  3 flakes                  PT Short Term Goals - 03/29/16 1623      PT SHORT TERM GOAL #1   Title be independent in initial HEP   Time 4   Period Weeks   Status Achieved     PT SHORT TERM GOAL #2   Title demonstrate Rt knee AROM flexion to > or = to 90 degrees to improve sitting and negotiating steps without substitution   Time 4   Period Weeks   Status Achieved     PT SHORT TERM GOAL #3   Title wean from walker to use of cane for all distances   Time 4   Period Weeks   Status Achieved     PT SHORT TERM GOAL #4   Title report < or = to 4/10 Rt knee pain with standing and walking   Time 4   Period Weeks   Status Achieved           PT Long Term Goals - 03/29/16 1624      PT LONG TERM GOAL #1   Title be independent in advanced HEP   Time 8   Period Weeks   Status On-going     PT LONG TERM GOAL #2   Title reduce FOTO to < or = to 43% limitation   Time 8   Period Weeks   Status On-going     PT LONG TERM GOAL #3   Title ascend steps with step-over-step with use of 1 rail   Time 8   Period Weeks   Status On-going     PT LONG TERM GOAL #4   Title demonstrate Rt knee AROM -5 to 100 degrees to improve mobility without substitution   Time 8   Period Weeks   Status On-going     PT LONG TERM GOAL #5   Title imrprove strength and endurance to walk for 45 minutes in the community without need for rest   Time 8  Period Weeks   Status Achieved      PT LONG TERM GOAL #6   Title demonstrate symmetry with ambulation on level surface with use of cane   Time 8   Period Weeks   Status On-going               Plan - 03/29/16 1653    Clinical Impression Statement Patient reports no pain today and has not taken any meds. Needs tactile cues with decending steps to bend knee. Continue to strengthen and stabilize knee and improve gait and balance.    Rehab Potential Good   Clinical Impairments Affecting Rehab Potential None   PT Frequency 2x / week   PT Duration 6 weeks   PT Treatment/Interventions ADLs/Self Care Home Management;Cryotherapy;Electrical Stimulation;Moist Heat;Therapeutic exercise;Therapeutic activities;Functional mobility training;Stair training;Gait training;Ultrasound;Balance training;Neuromuscular re-education;Patient/family education;Manual techniques;Vasopneumatic Device;Taping;Passive range of motion;Scar mobilization   PT Next Visit Plan Knee ROM, stretching, manual techniques for ROM using mulligan belt and contract relax, work on stairs;; right patella mobilization;s; work on [place] progress as needed   Consulted and Agree with Plan of Care Patient      Patient will benefit from skilled therapeutic intervention in order to improve the following deficits and impairments:  Abnormal gait, Decreased range of motion, Decreased endurance, Decreased activity tolerance, Pain, Increased edema, Decreased strength, Impaired flexibility  Visit Diagnosis: Pain in right knee  Muscle weakness (generalized)  Localized edema  Stiffness of right knee, not elsewhere classified  Other abnormalities of gait and mobility     Problem List Patient Active Problem List   Diagnosis Date Noted  . Status post total right knee replacement 12/22/2015  . Hepatic steatosis 02/09/2015  . Hot flashes 09/09/2014  . Osteopenia 09/09/2014  . Breast cancer of upper-inner quadrant of left female breast  ([place]) 07/23/2013  . Memory loss 07/14/2013  . Headache 07/14/2013  . Other malaise and fatigue 07/14/2013  . Acute pulmonary embolism ([place]) 10/07/2011  . Leukocytosis 10/07/2011  . Thyroid mass, left inferior lobe, 3.6cm [id number] 10/07/2011  . Hyponatremia 10/07/2011  . Obesity (BMI 30-39.9) 08/07/2011  . Post concussion syndrome     Mikle Bosworth PTA 03/29/2016, 4:58 PM  [facility] [address] [place], [place], [address] Phone: [phone]   Fax:  [phone]  Name: Alyssa [name] MRN: [id number] Date of Birth: February 17, 1951

## 2016-03-30 ENCOUNTER — Ambulatory Visit: Payer: BC Managed Care – PPO | Admitting: Physical Therapy

## 2016-03-30 ENCOUNTER — Encounter: Payer: Self-pay | Admitting: Physical Therapy

## 2016-03-30 DIAGNOSIS — M25561 Pain in right knee: Secondary | ICD-10-CM | POA: Diagnosis not present

## 2016-03-30 DIAGNOSIS — R2689 Other abnormalities of gait and mobility: Secondary | ICD-10-CM

## 2016-03-30 DIAGNOSIS — M25661 Stiffness of right knee, not elsewhere classified: Secondary | ICD-10-CM

## 2016-03-30 DIAGNOSIS — R600 Localized edema: Secondary | ICD-10-CM

## 2016-03-30 DIAGNOSIS — M6281 Muscle weakness (generalized): Secondary | ICD-10-CM

## 2016-03-30 NOTE — Therapy (Signed)
[facility] [address] [place], [place], [address] Phone: [phone]   Fax:  [phone]  Physical Therapy Treatment  Patient Details  Name: Alyssa [name] MRN: [id number] Date of Birth: 26-May-1951 Referring Provider: Rodell Perna, MD  Encounter Date: 03/30/2016      PT End of Session - 03/30/16 1552    Visit Number 28   Date for PT Re-Evaluation 04/19/16   PT Start Time 1522   PT Stop Time 1615   PT Time Calculation (min) 53 min   Activity Tolerance Patient tolerated treatment well   Behavior During Therapy [facility] for tasks assessed/performed      Past Medical History:  Diagnosis Date  . Abdominal pain   . Breast cancer ([place]) 06/2013   left upper inner  . Cancer ([place])    breast  . Diabetes mellitus without complication ([place])    Takes Tradjenta  . Family history of adverse reaction to anesthesia    Patients mother had to be resusciated after having anesthesia  . GERD (gastroesophageal reflux disease)   . Headache(784.0)    Takes Depakote  . Hernia, incisional periumbilical, incarcerated 0/62/6948  . Hot flashes   . Hx of radiation therapy 09/09/13- 10/13/13   left breast 5000 cGy in 25 sessions, no boost  . Hyperlipidemia   . Hypertension    PCP DR Nolon Rod  . Hypothyroidism   . Peripheral vascular disease ([place])    pulmonary embolis-still have some  . Post concussion syndrome    4 yrs ago- sees [facility]- Dr Leonie Man every 6 months-   has sleep study 2011 following accident but was neg per patient-  short term memory issues, dates and times  . Pulmonary embolism ([place]) 2013  . Recurrent upper respiratory infection (URI)    FLU 08/23/11-08/29/11 then URI 08/29/11- 09/08/11- S/P zpack and steroids-  improved      no cough or congestion  . Thyroid disease   . Thyroid mass, left inferior lobe, 3.6cm [id number] 10/07/2011  . Ventral hernia     Past Surgical History:  Procedure Laterality Date  . APPENDECTOMY   2002   lap appy.  Dr. Hassell Done  . BREAST LUMPECTOMY WITH NEEDLE LOCALIZATION AND AXILLARY SENTINEL LYMPH NODE BX Left 08/04/2013   Procedure: BREAST LUMPECTOMY WITH NEEDLE LOCALIZATION AND AXILLARY SENTINEL LYMPH NODE Biopsy x 3;  Surgeon: Rolm Bookbinder, MD;  Location: [place];  Service: General;  Laterality: Left;  . BREAST SURGERY    . CARPAL TUNNEL RELEASE  10/2009-12/2010   left - right  . CESAREAN SECTION  X 2  . COLONOSCOPY    . HERNIA REPAIR  09/15/11   Ventral w/mesh  . JOINT REPLACEMENT    . KNEE ARTHROPLASTY Right 12/22/2015   Procedure: COMPUTER ASSISTED TOTAL KNEE ARTHROPLASTY;  Surgeon: Marybelle Killings, MD;  Location: [place];  Service: Orthopedics;  Laterality: Right;  . LAPAROSCOPIC CHOLECYSTECTOMY  2006   Dr. Bubba Camp  . TARSAL TUNNEL RELEASE Bilateral   . THYROIDECTOMY, PARTIAL    . VENTRAL HERNIA REPAIR  09/29/2011   Procedure: LAPAROSCOPIC VENTRAL HERNIA;  Surgeon: Adin Hector, MD;  Location: [facility];  Service: General;  Laterality: N/A;  Laparoscopic Ventral Wall Hernia Repair with Mesh    There were no vitals filed for this visit.      Subjective Assessment - 03/30/16 1539    Subjective I use the elliptical at home.  No pain.    Pertinent History history of breast cancer  Limitations Standing;Walking   How long can you walk comfortably? 20 minutes or less   Patient Stated Goals improve flexion, wean from walker, improve strength, reduce pain   Currently in Pain? No/denies                         [facility] Adult PT Treatment/Exercise - 03/30/16 0001      Knee/Hip Exercises: Aerobic   Elliptical 4 min going back and forth every min on level 1   Recumbent Bike Rocking x 6 min  Did bike after manual which seemed to make bike more effecti     Knee/Hip Exercises: Machines for Strengthening   Cybex Knee Extension 5# 2 legs up and right leg down 20x   Cybex Knee Flexion 30# 2 legs down, right leg up 30x   Cybex Leg Press Seat #7 bil. 95# 30x  right 35# 30x      Knee/Hip Exercises: Standing   Heel Raises 2 sets;10 reps  on step   Step Down Left;1 set;15 reps;Hand Hold: 1;Step Height: 4"   Functional Squat 10 reps   Other Standing Knee Exercises step out to side and bring right leg back 10 x     Modalities   Modalities Vasopneumatic     Vasopneumatic   Number Minutes Vasopneumatic  15 minutes   Vasopnuematic Location  Knee   Vasopneumatic Pressure High   Vasopneumatic Temperature  3 flakes                PT Education - 03/30/16 1552    Education provided No          PT Short Term Goals - 03/29/16 1623      PT SHORT TERM GOAL #1   Title be independent in initial HEP   Time 4   Period Weeks   Status Achieved     PT SHORT TERM GOAL #2   Title demonstrate Rt knee AROM flexion to > or = to 90 degrees to improve sitting and negotiating steps without substitution   Time 4   Period Weeks   Status Achieved     PT SHORT TERM GOAL #3   Title wean from walker to use of cane for all distances   Time 4   Period Weeks   Status Achieved     PT SHORT TERM GOAL #4   Title report < or = to 4/10 Rt knee pain with standing and walking   Time 4   Period Weeks   Status Achieved           PT Long Term Goals - 03/29/16 1624      PT LONG TERM GOAL #1   Title be independent in advanced HEP   Time 8   Period Weeks   Status On-going     PT LONG TERM GOAL #2   Title reduce FOTO to < or = to 43% limitation   Time 8   Period Weeks   Status On-going     PT LONG TERM GOAL #3   Title ascend steps with step-over-step with use of 1 rail   Time 8   Period Weeks   Status On-going     PT LONG TERM GOAL #4   Title demonstrate Rt knee AROM -5 to 100 degrees to improve mobility without substitution   Time 8   Period Weeks   Status On-going     PT LONG TERM GOAL #5   Title imrprove strength  and endurance to walk for 45 minutes in the community without need for rest   Time 8   Period Weeks   Status Achieved     PT LONG  TERM GOAL #6   Title demonstrate symmetry with ambulation on level surface with use of cane   Time 8   Period Weeks   Status On-going               Plan - 03/30/16 1553    Clinical Impression Statement Patient is able to walk and do stairs with greater ease.  Patient is still working on right knee and calf strength.  Patient is able to go up weight on leg press.  Patient still needs to work on eccentric control. Patient will benefit form skilled therapy ot increase right knee strength and mobility.    Rehab Potential Good   Clinical Impairments Affecting Rehab Potential None   PT Frequency 2x / week   PT Duration 6 weeks   PT Treatment/Interventions ADLs/Self Care Home Management;Cryotherapy;Electrical Stimulation;Moist Heat;Therapeutic exercise;Therapeutic activities;Functional mobility training;Stair training;Gait training;Ultrasound;Balance training;Neuromuscular re-education;Patient/family education;Manual techniques;Vasopneumatic Device;Taping;Passive range of motion;Scar mobilization   PT Next Visit Plan Knee ROM, stretching, manual techniques for ROM using mulligan belt and contract relax, work on stairs;; right patella mobilization;s; work on [place] progress as needed   Consulted and Agree with Plan of Care Patient      Patient will benefit from skilled therapeutic intervention in order to improve the following deficits and impairments:  Abnormal gait, Decreased range of motion, Decreased endurance, Decreased activity tolerance, Pain, Increased edema, Decreased strength, Impaired flexibility  Visit Diagnosis: Pain in right knee  Muscle weakness (generalized)  Localized edema  Stiffness of right knee, not elsewhere classified  Other abnormalities of gait and mobility     Problem List Patient Active Problem List   Diagnosis Date Noted  . Status post total right knee replacement 12/22/2015  . Hepatic steatosis 02/09/2015  . Hot  flashes 09/09/2014  . Osteopenia 09/09/2014  . Breast cancer of upper-inner quadrant of left female breast ([place]) 07/23/2013  . Memory loss 07/14/2013  . Headache 07/14/2013  . Other malaise and fatigue 07/14/2013  . Acute pulmonary embolism ([place]) 10/07/2011  . Leukocytosis 10/07/2011  . Thyroid mass, left inferior lobe, 3.6cm [id number] 10/07/2011  . Hyponatremia 10/07/2011  . Obesity (BMI 30-39.9) 08/07/2011  . Post concussion syndrome     Earlie Counts, PT 03/30/16 3:57 PM   [facility] [address], [place], [place], [address] Phone: [phone]   Fax:  [phone]  Name: Alyssa [name] MRN: [id number] Date of Birth: 12-26-1950

## 2016-04-03 ENCOUNTER — Ambulatory Visit: Payer: BC Managed Care – PPO | Admitting: Physical Therapy

## 2016-04-03 ENCOUNTER — Encounter: Payer: Self-pay | Admitting: Physical Therapy

## 2016-04-03 DIAGNOSIS — M25661 Stiffness of right knee, not elsewhere classified: Secondary | ICD-10-CM

## 2016-04-03 DIAGNOSIS — M25561 Pain in right knee: Secondary | ICD-10-CM | POA: Diagnosis not present

## 2016-04-03 DIAGNOSIS — R600 Localized edema: Secondary | ICD-10-CM

## 2016-04-03 DIAGNOSIS — M6281 Muscle weakness (generalized): Secondary | ICD-10-CM

## 2016-04-03 DIAGNOSIS — R2689 Other abnormalities of gait and mobility: Secondary | ICD-10-CM

## 2016-04-03 NOTE — Therapy (Signed)
[facility] [address] [place], [place], [address] Phone: [phone]   Fax:  [phone]  Physical Therapy Treatment  Patient Details  Name: Alyssa [name] MRN: [id number] Date of Birth: 1950-09-04 Referring Provider: Rodell Perna, MD  Encounter Date: 04/03/2016      PT End of Session - 04/03/16 1700    Visit Number 29   Date for PT Re-Evaluation 04/19/16   PT Start Time 1615   PT Stop Time 1712   PT Time Calculation (min) 57 min   Activity Tolerance Patient tolerated treatment well   Behavior During Therapy [facility] for tasks assessed/performed      Past Medical History:  Diagnosis Date  . Abdominal pain   . Breast cancer ([place]) 06/2013   left upper inner  . Cancer ([place])    breast  . Diabetes mellitus without complication ([place])    Takes Tradjenta  . Family history of adverse reaction to anesthesia    Patients mother had to be resusciated after having anesthesia  . GERD (gastroesophageal reflux disease)   . Headache(784.0)    Takes Depakote  . Hernia, incisional periumbilical, incarcerated 3/66/2947  . Hot flashes   . Hx of radiation therapy 09/09/13- 10/13/13   left breast 5000 cGy in 25 sessions, no boost  . Hyperlipidemia   . Hypertension    PCP DR Nolon Rod  . Hypothyroidism   . Peripheral vascular disease ([place])    pulmonary embolis-still have some  . Post concussion syndrome    4 yrs ago- sees [facility]- Dr Leonie Man every 6 months-   has sleep study 2011 following accident but was neg per patient-  short term memory issues, dates and times  . Pulmonary embolism ([place]) 2013  . Recurrent upper respiratory infection (URI)    FLU 08/23/11-08/29/11 then URI 08/29/11- 09/08/11- S/P zpack and steroids-  improved      no cough or congestion  . Thyroid disease   . 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 Ventral hernia     Past Surgical History:  Procedure Laterality Date  . APPENDECTOMY   2002   lap appy.  Dr. Hassell Done  . BREAST LUMPECTOMY WITH NEEDLE LOCALIZATION AND AXILLARY SENTINEL LYMPH NODE BX Left 08/04/2013   Procedure: BREAST LUMPECTOMY WITH NEEDLE LOCALIZATION AND AXILLARY SENTINEL LYMPH NODE Biopsy x 3;  Surgeon: Rolm Bookbinder, MD;  Location: [place];  Service: General;  Laterality: Left;  . BREAST SURGERY    . CARPAL TUNNEL RELEASE  10/2009-12/2010   left - right  . CESAREAN SECTION  X 2  . COLONOSCOPY    . HERNIA REPAIR  09/15/11   Ventral w/mesh  . JOINT REPLACEMENT    . KNEE ARTHROPLASTY Right 12/22/2015   Procedure: COMPUTER ASSISTED TOTAL KNEE ARTHROPLASTY;  Surgeon: Marybelle Killings, MD;  Location: [facility];  Service: Orthopedics;  Laterality: Right;  . LAPAROSCOPIC CHOLECYSTECTOMY  2006   Dr. Bubba Camp  . TARSAL TUNNEL RELEASE Bilateral   . THYROIDECTOMY, PARTIAL    . VENTRAL HERNIA REPAIR  09/29/2011   Procedure: LAPAROSCOPIC VENTRAL HERNIA;  Surgeon: Adin Hector, MD;  [facility];  Service: General;  Laterality: N/A;  Laparoscopic Ventral Wall Hernia Repair with Mesh    There were no vitals filed for this visit.      Subjective Assessment - 04/03/16 1623    Subjective Pt reports tightness in knee but denies pain. Has been walking at home and at work with no  cane.    Pertinent History history of breast cancer   Limitations Standing;Walking   How long can you walk comfortably? 20 minutes or less   Currently in Pain? No/denies                         [facility] Adult PT Treatment/Exercise - 04/03/16 0001      Knee/Hip Exercises: Stretches   Passive Hamstring Stretch Right;3 reps;10 seconds   Quad Stretch Right;10 seconds;5 reps     Knee/Hip Exercises: Aerobic   Recumbent Bike Rocking x 6 min     Knee/Hip Exercises: Machines for Strengthening   Cybex Knee Extension 5# 2 legs up and right leg down 20x   Cybex Knee Flexion 30# 2 legs down, right leg up 30x   Cybex Leg Press Seat #7 bil. 95# 30x  right 35# 30x     Knee/Hip Exercises:  Standing   Hip Abduction AROM;Stengthening;Both;2 sets;10 reps   Step Down Left;1 set;15 reps;Hand Hold: 1;Step Height: 4"   Functional Squat 10 reps     Knee/Hip Exercises: Supine   Heel Slides Right;1 set;10 reps;AROM;PROM   Terminal Knee Extension AROM;Strengthening;Right;2 sets;10 reps     Modalities   Modalities Vasopneumatic     Vasopneumatic   Number Minutes Vasopneumatic  15 minutes   Vasopnuematic Location  Knee   Vasopneumatic Pressure High   Vasopneumatic Temperature  3 flakes                  PT Short Term Goals - 04/03/16 1624      PT SHORT TERM GOAL #1   Title be independent in initial HEP   Time 4   Period Weeks   Status Achieved     PT SHORT TERM GOAL #2   Title demonstrate Rt knee AROM flexion to > or = to 90 degrees to improve sitting and negotiating steps without substitution   Time 4   Period Weeks   Status Achieved     PT SHORT TERM GOAL #3   Title wean from walker to use of cane for all distances   Time 4   Period Weeks   Status Achieved     PT SHORT TERM GOAL #4   Title report < or = to 4/10 Rt knee pain with standing and walking   Time 4   Period Weeks   Status Achieved           PT Long Term Goals - 04/03/16 1625      PT LONG TERM GOAL #1   Title be independent in advanced HEP   Time 8   Period Weeks   Status On-going     PT LONG TERM GOAL #2   Title reduce FOTO to < or = to 43% limitation   Time 8   Period Weeks   Status On-going     PT LONG TERM GOAL #3   Title ascend steps with step-over-step with use of 1 rail   Time 8   Period Weeks   Status On-going     PT LONG TERM GOAL #4   Title demonstrate Rt knee AROM -5 to 100 degrees to improve mobility without substitution   Time 8   Period Weeks   Status On-going     PT LONG TERM GOAL #5   Title imrprove strength and endurance to walk for 45 minutes in the community without need for rest   Time 8   Period Weeks  Status Achieved     PT LONG TERM GOAL  #6   Title demonstrate symmetry with ambulation on level surface with use of cane   Time 8   Period Weeks   Status On-going               Plan - 04/03/16 1657    Clinical Impression Statement Pt strength is progressing well. Continues to have some difficulty with single leg activities. Continue to increase eccentric strength. Continue to increase ROM and decrease tightness.    Rehab Potential Good   Clinical Impairments Affecting Rehab Potential None   PT Frequency 2x / week   PT Duration 6 weeks   PT Treatment/Interventions ADLs/Self Care Home Management;Cryotherapy;Electrical Stimulation;Moist Heat;Therapeutic exercise;Therapeutic activities;Functional mobility training;Stair training;Gait training;Ultrasound;Balance training;Neuromuscular re-education;Patient/family education;Manual techniques;Vasopneumatic Device;Taping;Passive range of motion;Scar mobilization   PT Next Visit Plan Knee ROM, stretching, manual techniques for ROM using mulligan belt and contract relax, work on stairs;; right patella mobilization;s; work on Product/process development scientist with Plan of Care Patient      Patient will benefit from skilled therapeutic intervention in order to improve the following deficits and impairments:  Abnormal gait, Decreased range of motion, Decreased endurance, Decreased activity tolerance, Pain, Increased edema, Decreased strength, Impaired flexibility  Visit Diagnosis: Pain in right knee  Muscle weakness (generalized)  Localized edema  Stiffness of right knee, not elsewhere classified  Other abnormalities of gait and mobility     Problem List Patient Active Problem List   Diagnosis Date Noted  . Status post total right knee replacement 12/22/2015  . Hepatic steatosis 02/09/2015  . Hot flashes 09/09/2014  . Osteopenia 09/09/2014  . Breast cancer of upper-inner quadrant of left female breast ([place]) 07/23/2013  . Memory loss 07/14/2013  . Headache  07/14/2013  . Other malaise and fatigue 07/14/2013  . Acute pulmonary embolism ([place]) 10/07/2011  . Leukocytosis 10/07/2011  . Thyroid mass, left inferior lobe, 3.6cm [id number] 10/07/2011  . Hyponatremia 10/07/2011  . Obesity (BMI 30-39.9) 08/07/2011  . Post concussion syndrome     Mikle Bosworth PTA 04/03/2016, 5:02 PM  [facility] [address] [place], [place], [address] Phone: [phone]   Fax:  [phone]  Name: ZYKIRA MATLACK MRN: [id number] Date of Birth: 04-Mar-1951

## 2016-04-05 ENCOUNTER — Encounter: Payer: Self-pay | Admitting: Physical Therapy

## 2016-04-05 ENCOUNTER — Ambulatory Visit: Payer: BC Managed Care – PPO | Admitting: Physical Therapy

## 2016-04-05 DIAGNOSIS — M25561 Pain in right knee: Secondary | ICD-10-CM

## 2016-04-05 DIAGNOSIS — M25661 Stiffness of right knee, not elsewhere classified: Secondary | ICD-10-CM

## 2016-04-05 DIAGNOSIS — R600 Localized edema: Secondary | ICD-10-CM

## 2016-04-05 DIAGNOSIS — M6281 Muscle weakness (generalized): Secondary | ICD-10-CM

## 2016-04-05 NOTE — Therapy (Signed)
[facility] [address], [place], [place], [address] Phone: [phone]   Fax:  [phone]  Physical Therapy Treatment  Patient Details  Name: Alyssa [name] MRN: [id number] Date of Birth: 12-30-1950 Referring Provider: Rodell Perna, MD  Encounter Date: 04/05/2016      PT End of Session - 04/05/16 1626    Visit Number 30   Date for PT Re-Evaluation 04/19/16   PT Start Time 7026   PT Stop Time 3785   PT Time Calculation (min) 58 min   Activity Tolerance Patient tolerated treatment well   Behavior During Therapy [facility] for tasks assessed/performed      Past Medical History:  Diagnosis Date  . Abdominal pain   . Breast cancer ([place]) 06/2013   left upper inner  . Cancer ([place])    breast  . Diabetes mellitus without complication ([place])    Takes Tradjenta  . Family history of adverse reaction to anesthesia    Patients mother had to be resusciated after having anesthesia  . GERD (gastroesophageal reflux disease)   . Headache(784.0)    Takes Depakote  . Hernia, incisional periumbilical, incarcerated 8/85/0277  . Hot flashes   . Hx of radiation therapy 09/09/13- 10/13/13   left breast 5000 cGy in 25 sessions, no boost  . Hyperlipidemia   . Hypertension    PCP DR Nolon Rod  . Hypothyroidism   . Peripheral vascular disease ([place])    pulmonary embolis-still have some  . Post concussion syndrome    4 yrs ago- sees [facility]- Dr Leonie Man every 6 months-   has sleep study 2011 following accident but was neg per patient-  short term memory issues, dates and times  . Pulmonary embolism ([place]) 2013  . Recurrent upper respiratory infection (URI)    FLU 08/23/11-08/29/11 then URI 08/29/11- 09/08/11- S/P zpack and steroids-  improved      no cough or congestion  . Thyroid disease   . 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 Ventral hernia     Past Surgical History:  Procedure Laterality Date  . APPENDECTOMY   2002   lap appy.  Dr. Hassell Done  . BREAST LUMPECTOMY WITH NEEDLE LOCALIZATION AND AXILLARY SENTINEL LYMPH NODE BX Left 08/04/2013   Procedure: BREAST LUMPECTOMY WITH NEEDLE LOCALIZATION AND AXILLARY SENTINEL LYMPH NODE Biopsy x 3;  Surgeon: Rolm Bookbinder, MD;  Location: [facility];  Service: General;  Laterality: Left;  . BREAST SURGERY    . CARPAL TUNNEL RELEASE  10/2009-12/2010   left - right  . CESAREAN SECTION  X 2  . COLONOSCOPY    . HERNIA REPAIR  09/15/11   Ventral w/mesh  . JOINT REPLACEMENT    . KNEE ARTHROPLASTY Right 12/22/2015   Procedure: COMPUTER ASSISTED TOTAL KNEE ARTHROPLASTY;  Surgeon: Marybelle Killings, MD;  Location: [facility];  Service: Orthopedics;  Laterality: Right;  . LAPAROSCOPIC CHOLECYSTECTOMY  2006   Dr. Bubba Camp  . TARSAL TUNNEL RELEASE Bilateral   . THYROIDECTOMY, PARTIAL    . VENTRAL HERNIA REPAIR  09/29/2011   Procedure: LAPAROSCOPIC VENTRAL HERNIA;  Surgeon: Adin Hector, MD;  Location: [facility];  Service: General;  Laterality: N/A;  Laparoscopic Ventral Wall Hernia Repair with Mesh    There were no vitals filed for this visit.      Subjective Assessment - 04/05/16 1624    Subjective No new complaints   Currently in Pain? Yes   Pain Score 2    Pain  Location Knee   Pain Orientation Right   Pain Descriptors / Indicators Aching;Dull   Aggravating Factors  Just doing alot on my feet   Pain Relieving Factors Moving   Multiple Pain Sites No                         [facility] Adult PT Treatment/Exercise - 04/05/16 0001      Knee/Hip Exercises: Stretches   Active Hamstring Stretch Right;3 reps;30 seconds   Other Knee/Hip Stretches IT band  stretch Rt with stap 2x 30 sec     Knee/Hip Exercises: Aerobic   Elliptical 4 min 1 min each direction   Recumbent Bike 5 min rocking with end range stretch     Knee/Hip Exercises: Machines for Strengthening   Cybex Knee Extension 5# 2 legs up and right leg down 20x   Cybex Knee Flexion 30# 2 legs down, right leg  up 30x   Cybex Leg Press Seat 7: bil 95# 3x10, RTLE 40# 2x10     Knee/Hip Exercises: Standing   Forward Step Up Right;2 sets;20 reps;Hand Hold: 1;Step Height: 6"     Knee/Hip Exercises: Supine   Bridges with [employer] --  Bridge with legs up on red ball 2 x10     Knee/Hip Exercises: Sidelying   Hip ABduction Strengthening;Right;2 sets;10 reps     Vasopneumatic   Number Minutes Vasopneumatic  15 minutes   Vasopnuematic Location  Knee   Vasopneumatic Pressure High   Vasopneumatic Temperature  3 flakes                  PT Short Term Goals - 04/03/16 1624      PT SHORT TERM GOAL #1   Title be independent in initial HEP   Time 4   Period Weeks   Status Achieved     PT SHORT TERM GOAL #2   Title demonstrate Rt knee AROM flexion to > or = to 90 degrees to improve sitting and negotiating steps without substitution   Time 4   Period Weeks   Status Achieved     PT SHORT TERM GOAL #3   Title wean from walker to use of cane for all distances   Time 4   Period Weeks   Status Achieved     PT SHORT TERM GOAL #4   Title report < or = to 4/10 Rt knee pain with standing and walking   Time 4   Period Weeks   Status Achieved           PT Long Term Goals - 04/03/16 1625      PT LONG TERM GOAL #1   Title be independent in advanced HEP   Time 8   Period Weeks   Status On-going     PT LONG TERM GOAL #2   Title reduce FOTO to < or = to 43% limitation   Time 8   Period Weeks   Status On-going     PT LONG TERM GOAL #3   Title ascend steps with step-over-step with use of 1 rail   Time 8   Period Weeks   Status On-going     PT LONG TERM GOAL #4   Title demonstrate Rt knee AROM -5 to 100 degrees to improve mobility without substitution   Time 8   Period Weeks   Status On-going     PT LONG TERM GOAL #5   Title imrprove strength and endurance to  walk for 45 minutes in the community without need for rest   Time 8   Period Weeks   Status Achieved     PT  LONG TERM GOAL #6   Title demonstrate symmetry with ambulation on level surface with use of cane   Time 8   Period Weeks   Status On-going               Plan - 04/05/16 1626    Clinical Impression Statement Remains unable to make full revolution on the bike, knee tightness prevents. Overall pt is able to perform her ADLs to her satisfaction.   Rehab Potential Good   Clinical Impairments Affecting Rehab Potential None   PT Frequency 2x / week   PT Duration 6 weeks   PT Treatment/Interventions ADLs/Self Care Home Management;Cryotherapy;Electrical Stimulation;Moist Heat;Therapeutic exercise;Therapeutic activities;Functional mobility training;Stair training;Gait training;Ultrasound;Balance training;Neuromuscular re-education;Patient/family education;Manual techniques;Vasopneumatic Device;Taping;Passive range of motion;Scar mobilization   PT Next Visit Plan Knee ROM, stretching, manual techniques for ROM using mulligan belt and contract relax, work on stairs;; right patella mobilization;s; work on Product/process development scientist with Plan of Care Patient      Patient will benefit from skilled therapeutic intervention in order to improve the following deficits and impairments:  Abnormal gait, Decreased range of motion, Decreased endurance, Decreased activity tolerance, Pain, Increased edema, Decreased strength, Impaired flexibility  Visit Diagnosis: Pain in right knee  Muscle weakness (generalized)  Localized edema  Stiffness of right knee, not elsewhere classified     Problem List Patient Active Problem List   Diagnosis Date Noted  . Status post total right knee replacement 12/22/2015  . Hepatic steatosis 02/09/2015  . Hot flashes 09/09/2014  . Osteopenia 09/09/2014  . Breast cancer of upper-inner quadrant of left female breast ([place]) 07/23/2013  . Memory loss 07/14/2013  . Headache 07/14/2013  . Other malaise and fatigue 07/14/2013  . Acute pulmonary embolism  ([place]) 10/07/2011  . Leukocytosis 10/07/2011  . Thyroid mass, left inferior lobe, 3.6cm [id number] 10/07/2011  . Hyponatremia 10/07/2011  . Obesity (BMI 30-39.9) 08/07/2011  . Post concussion syndrome     Crosby Bevan, PTA 04/05/2016, 5:02 PM  [facility] [address] [place], [place], [address] Phone: [phone]   Fax:  [phone]  Name: LOVENA KLUCK MRN: [id number] Date of Birth: 1951-03-05

## 2016-04-06 ENCOUNTER — Encounter: Payer: Self-pay | Admitting: Physical Therapy

## 2016-04-06 ENCOUNTER — Ambulatory Visit: Payer: BC Managed Care – PPO | Admitting: Physical Therapy

## 2016-04-06 DIAGNOSIS — M25561 Pain in right knee: Secondary | ICD-10-CM

## 2016-04-06 DIAGNOSIS — M25661 Stiffness of right knee, not elsewhere classified: Secondary | ICD-10-CM

## 2016-04-06 DIAGNOSIS — M6281 Muscle weakness (generalized): Secondary | ICD-10-CM

## 2016-04-06 DIAGNOSIS — R600 Localized edema: Secondary | ICD-10-CM

## 2016-04-06 DIAGNOSIS — R2689 Other abnormalities of gait and mobility: Secondary | ICD-10-CM

## 2016-04-06 NOTE — Therapy (Signed)
[facility] [address], [place], [place], [address] Phone: [phone]   Fax:  [phone]  Physical Therapy Treatment  Patient Details  Name: Alyssa [name] MRN: [id number] Date of Birth: [birth year] Referring Provider: Rodell Perna, MD  Encounter Date: 04/06/2016      PT End of Session - 04/06/16 1646    Visit Number 31   Date for PT Re-Evaluation 04/19/16   PT Start Time 1600   PT Stop Time 1655   PT Time Calculation (min) 55 min   Activity Tolerance Patient tolerated treatment well   Behavior During Therapy [facility] for tasks assessed/performed      Past Medical History:  Diagnosis Date  . Abdominal pain   . Breast cancer ([place]) 06/2013   left upper inner  . Cancer ([place])    breast  . Diabetes mellitus without complication ([place])    Takes Tradjenta  . Family history of adverse reaction to anesthesia    Patients mother had to be resusciated after having anesthesia  . GERD (gastroesophageal reflux disease)   . Headache(784.0)    Takes Depakote  . Hernia, incisional periumbilical, incarcerated 11/17/621  . Hot flashes   . Hx of radiation therapy 09/09/13- 10/13/13   left breast 5000 cGy in 25 sessions, no boost  . Hyperlipidemia   . Hypertension    PCP DR Nolon Rod  . Hypothyroidism   . Peripheral vascular disease ([place])    pulmonary embolis-still have some  . Post concussion syndrome    4 yrs ago- sees [facility]- Dr Leonie Man every 6 months-   has sleep study 2011 following accident but was neg per patient-  short term memory issues, dates and times  . Pulmonary embolism ([place]) 2013  . Recurrent upper respiratory infection (URI)    FLU 08/23/11-08/29/11 then URI 08/29/11- 09/08/11- S/P zpack and steroids-  improved      no cough or congestion  . Thyroid disease   . 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 Ventral hernia     Past Surgical History:  Procedure Laterality Date  . APPENDECTOMY   2002   lap appy.  Dr. Hassell Done  . BREAST LUMPECTOMY WITH NEEDLE LOCALIZATION AND AXILLARY SENTINEL LYMPH NODE BX Left 08/04/2013   Procedure: BREAST LUMPECTOMY WITH NEEDLE LOCALIZATION AND AXILLARY SENTINEL LYMPH NODE Biopsy x 3;  Surgeon: Rolm Bookbinder, MD;  Location: [facility];  Service: General;  Laterality: Left;  . BREAST SURGERY    . CARPAL TUNNEL RELEASE  10/2009-12/2010   left - right  . CESAREAN SECTION  X 2  . COLONOSCOPY    . HERNIA REPAIR  09/15/11   Ventral w/mesh  . JOINT REPLACEMENT    . KNEE ARTHROPLASTY Right 12/22/2015   Procedure: COMPUTER ASSISTED TOTAL KNEE ARTHROPLASTY;  Surgeon: Marybelle Killings, MD;  Location: [facility];  Service: Orthopedics;  Laterality: Right;  . LAPAROSCOPIC CHOLECYSTECTOMY  2006   Dr. Bubba Camp  . TARSAL TUNNEL RELEASE Bilateral   . THYROIDECTOMY, PARTIAL    . VENTRAL HERNIA REPAIR  09/29/2011   Procedure: LAPAROSCOPIC VENTRAL HERNIA;  Surgeon: Adin Hector, MD;  [facility];  Service: General;  Laterality: N/A;  Laparoscopic Ventral Wall Hernia Repair with Mesh    There were no vitals filed for this visit.      Subjective Assessment - 04/06/16 1617    Subjective I am sore today due to my workout.    Pertinent History history of breast cancer  Limitations Standing;Walking   How long can you walk comfortably? 20 minutes or less   Patient Stated Goals improve flexion, wean from walker, improve strength, reduce pain   Currently in Pain? No/denies   Multiple Pain Sites No            [facility] PT Assessment - 04/06/16 0001      PROM   Right Knee Extension -2   Right Knee Flexion 100                     [facility] Adult PT Treatment/Exercise - 04/06/16 0001      Knee/Hip Exercises: Aerobic   Elliptical 4 min 1 min each direction   Recumbent Bike 5 min rocking with end range stretch     Knee/Hip Exercises: Machines for Strengthening   Cybex Knee Extension 15# 2 legs up and down 20x   Cybex Knee Flexion 30# 2 legs down, right leg  up 30x   Cybex Leg Press Seat 7: bil 95# 3x10, RTLE 40# 2x10     Knee/Hip Exercises: Standing   Heel Raises Right;10 reps  up with 2 down with right   Heel Raises Limitations 10 x right on step   Functional Squat 10 reps   Functional Squat Limitations picking up blue plyoball from step with VC to flex knees   Other Standing Knee Exercises place right foot onto back of elliptical and back while holding onto the chair 15 x;      Knee/Hip Exercises: Seated   Long Arc Quad AAROM;Strengthening;Right;1 set;10 reps   Hamstring Curl AAROM;Right;1 set;10 reps     Modalities   Modalities Vasopneumatic     Vasopneumatic   Number Minutes Vasopneumatic  15 minutes   Vasopnuematic Location  Knee   Vasopneumatic Pressure High   Vasopneumatic Temperature  3 flakes     Manual Therapy   Manual Therapy Joint mobilization;Passive ROM   Joint Mobilization anterior glide with mulligan belt working on right kne flexion   Passive ROM right knee PROM for fleixon and extension                PT Education - 04/06/16 1646    Education provided No          PT Short Term Goals - 04/03/16 1624      PT SHORT TERM GOAL #1   Title be independent in initial HEP   Time 4   Period Weeks   Status Achieved     PT SHORT TERM GOAL #2   Title demonstrate Rt knee AROM flexion to > or = to 90 degrees to improve sitting and negotiating steps without substitution   Time 4   Period Weeks   Status Achieved     PT SHORT TERM GOAL #3   Title wean from walker to use of cane for all distances   Time 4   Period Weeks   Status Achieved     PT SHORT TERM GOAL #4   Title report < or = to 4/10 Rt knee pain with standing and walking   Time 4   Period Weeks   Status Achieved           PT Long Term Goals - 04/03/16 1625      PT LONG TERM GOAL #1   Title be independent in advanced HEP   Time 8   Period Weeks   Status On-going     PT LONG TERM GOAL #2   Title reduce  FOTO to < or = to 43%  limitation   Time 8   Period Weeks   Status On-going     PT LONG TERM GOAL #3   Title ascend steps with step-over-step with use of 1 rail   Time 8   Period Weeks   Status On-going     PT LONG TERM GOAL #4   Title demonstrate Rt knee AROM -5 to 100 degrees to improve mobility without substitution   Time 8   Period Weeks   Status On-going     PT LONG TERM GOAL #5   Title imrprove strength and endurance to walk for 45 minutes in the community without need for rest   Time 8   Period Weeks   Status Achieved     PT LONG TERM GOAL #6   Title demonstrate symmetry with ambulation on level surface with use of cane   Time 8   Period Weeks   Status On-going               Plan - 04/06/16 1646    Clinical Impression Statement Patient is able to keep her 100 degrees of right knee flexion PROM and it is easier to achieve the motion.  Patient has increased right knee extension passively and able to work in the new motion actively.  Patient has difficulty geting in her car and pushing on the gas pedal.  Patient will benefit from skilled therapy to improve right knee ROM and strength.    Rehab Potential Good   Clinical Impairments Affecting Rehab Potential None   PT Frequency 2x / week   PT Duration 6 weeks   PT Treatment/Interventions ADLs/Self Care Home Management;Cryotherapy;Electrical Stimulation;Moist Heat;Therapeutic exercise;Therapeutic activities;Functional mobility training;Stair training;Gait training;Ultrasound;Balance training;Neuromuscular re-education;Patient/family education;Manual techniques;Vasopneumatic Device;Taping;Passive range of motion;Scar mobilization   PT Next Visit Plan Knee ROM, stretching, manual techniques for ROM using mulligan belt and contract relax, work on stairs; work on [place] progress as needed   Consulted and Agree with Plan of Care Patient      Patient will benefit from skilled therapeutic intervention in order  to improve the following deficits and impairments:  Abnormal gait, Decreased range of motion, Decreased endurance, Decreased activity tolerance, Pain, Increased edema, Decreased strength, Impaired flexibility  Visit Diagnosis: Pain in right knee  Muscle weakness (generalized)  Localized edema  Stiffness of right knee, not elsewhere classified  Other abnormalities of gait and mobility     Problem List Patient Active Problem List   Diagnosis Date Noted  . Status post total right knee replacement 12/22/2015  . Hepatic steatosis 02/09/2015  . Hot flashes 09/09/2014  . Osteopenia 09/09/2014  . 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 Memory loss 07/14/2013  . Headache 07/14/2013  . Other malaise and fatigue 07/14/2013  . Acute pulmonary embolism ([place]) 10/07/2011  . Leukocytosis 10/07/2011  . Thyroid mass, left inferior lobe, 3.6cm [id number] 10/07/2011  . Hyponatremia 10/07/2011  . Obesity (BMI 30-39.9) 08/07/2011  . Post concussion syndrome     Earlie Counts, PT 04/06/16 4:50 PM   [facility] [address] [place], [place], [address] Phone: [phone]   Fax:  [phone]  Name: Alyssa [name] MRN: [id number] Date of Birth: 03/12/1951

## 2016-04-10 ENCOUNTER — Ambulatory Visit: Payer: BC Managed Care – PPO

## 2016-04-10 ENCOUNTER — Encounter: Payer: Self-pay | Admitting: Nurse Practitioner

## 2016-04-10 DIAGNOSIS — R600 Localized edema: Secondary | ICD-10-CM

## 2016-04-10 DIAGNOSIS — M25661 Stiffness of right knee, not elsewhere classified: Secondary | ICD-10-CM

## 2016-04-10 DIAGNOSIS — M25561 Pain in right knee: Secondary | ICD-10-CM | POA: Diagnosis not present

## 2016-04-10 DIAGNOSIS — R2689 Other abnormalities of gait and mobility: Secondary | ICD-10-CM

## 2016-04-10 DIAGNOSIS — M6281 Muscle weakness (generalized): Secondary | ICD-10-CM

## 2016-04-10 NOTE — Therapy (Signed)
[facility] [address] [place], [place], [address] Phone: [phone]   Fax:  [phone]  Physical Therapy Treatment  Patient Details  Name: Alyssa [name] MRN: [id number] Date of Birth: [birth year] Referring Provider: Rodell Perna, MD  Encounter Date: 04/10/2016      PT End of Session - 04/10/16 1644    Visit Number 32   Date for PT Re-Evaluation 04/19/16   PT Start Time 1558   PT Stop Time 1700   PT Time Calculation (min) 62 min   Activity Tolerance Patient tolerated treatment well   Behavior During Therapy [facility] for tasks assessed/performed      Past Medical History:  Diagnosis Date  . Abdominal pain   . Breast cancer ([place]) 06/2013   left upper inner  . Cancer ([place])    breast  . Diabetes mellitus without complication ([place])    Takes Tradjenta  . Family history of adverse reaction to anesthesia    Patients mother had to be resusciated after having anesthesia  . GERD (gastroesophageal reflux disease)   . Headache(784.0)    Takes Depakote  . Hernia, incisional periumbilical, incarcerated 7/85/8850  . Hot flashes   . Hx of radiation therapy 09/09/13- 10/13/13   left breast 5000 cGy in 25 sessions, no boost  . Hyperlipidemia   . Hypertension    PCP DR Nolon Rod  . Hypothyroidism   . Peripheral vascular disease ([place])    pulmonary embolis-still have some  . Post concussion syndrome    4 yrs ago- sees [facility]- Dr Leonie Man every 6 months-   has sleep study 2011 following accident but was neg per patient-  short term memory issues, dates and times  . Pulmonary embolism ([place]) 2013  . Recurrent upper respiratory infection (URI)    FLU 08/23/11-08/29/11 then URI 08/29/11- 09/08/11- S/P zpack and steroids-  improved      no cough or congestion  . Thyroid disease   . Thyroid mass, left inferior lobe, 3.6cm [id number] 10/07/2011  . Ventral hernia     Past Surgical History:  Procedure Laterality Date  . APPENDECTOMY   2002   lap appy.  Dr. Hassell Done  . BREAST LUMPECTOMY WITH NEEDLE LOCALIZATION AND AXILLARY SENTINEL LYMPH NODE BX Left 08/04/2013   Procedure: BREAST LUMPECTOMY WITH NEEDLE LOCALIZATION AND AXILLARY SENTINEL LYMPH NODE Biopsy x 3;  Surgeon: Rolm Bookbinder, MD;  Location: [place];  Service: General;  Laterality: Left;  . BREAST SURGERY    . CARPAL TUNNEL RELEASE  10/2009-12/2010   left - right  . CESAREAN SECTION  X 2  . COLONOSCOPY    . HERNIA REPAIR  09/15/11   Ventral w/mesh  . JOINT REPLACEMENT    . KNEE ARTHROPLASTY Right 12/22/2015   Procedure: COMPUTER ASSISTED TOTAL KNEE ARTHROPLASTY;  Surgeon: Marybelle Killings, MD;  Location: [place];  Service: Orthopedics;  Laterality: Right;  . LAPAROSCOPIC CHOLECYSTECTOMY  2006   Dr. Bubba Camp  . TARSAL TUNNEL RELEASE Bilateral   . THYROIDECTOMY, PARTIAL    . VENTRAL HERNIA REPAIR  09/29/2011   Procedure: LAPAROSCOPIC VENTRAL HERNIA;  Surgeon: Adin Hector, MD;  Location: [facility];  Service: General;  Laterality: N/A;  Laparoscopic Ventral Wall Hernia Repair with Mesh    There were no vitals filed for this visit.      Subjective Assessment - 04/10/16 1623    Subjective I am still walking with a limp   Currently in Pain? Yes   Pain Score 2  Pain Location Knee   Pain Orientation Right   Pain Descriptors / Indicators Aching;Dull   Pain Type Surgical pain   Pain Onset More than a month ago   Pain Frequency Intermittent   Aggravating Factors  walking without cane, a lot of standing   Pain Relieving Factors rest            [facility] PT Assessment - 04/10/16 0001      PROM   Right Knee Flexion 95  tested in supine (previous was taken in sitting)                     [facility] Adult PT Treatment/Exercise - 04/10/16 0001      Ambulation/Gait   Stairs Yes   Stairs Assistance 6: Modified independent (Device/Increase time)   Stair Management Technique One rail Right   Number of Stairs 18     Knee/Hip Exercises: Stretches   Active  Hamstring Stretch Right;3 reps;30 seconds     Knee/Hip Exercises: Aerobic   Elliptical 4 min 1 min each direction  ramp 5, level 3   Recumbent Bike 7 min rocking with end range stretch  full revolution in reverse     Knee/Hip Exercises: Machines for Strengthening   Cybex Knee Extension 15# 2 legs up and down 20x   Cybex Knee Flexion 30# 2 legs down, right leg up 30x   Cybex Leg Press Seat 7: bil 100# 3x10, Rt LE 40# 2x10     Knee/Hip Exercises: Standing   Heel Raises Right;10 reps  up with 2 down with right   Functional Squat 10 reps     Knee/Hip Exercises: Seated   Long Arc Quad AAROM;Strengthening;Right;1 set;10 reps   Long Arc Quad Weight 2 lbs.     Modalities   Modalities Vasopneumatic     Vasopneumatic   Number Minutes Vasopneumatic  15 minutes   Vasopnuematic Location  Knee   Vasopneumatic Pressure High   Vasopneumatic Temperature  3 flakes     Manual Therapy   Manual Therapy --   Joint Mobilization --   Passive ROM --                  PT Short Term Goals - 04/03/16 1624      PT SHORT TERM GOAL #1   Title be independent in initial HEP   Time 4   Period Weeks   Status Achieved     PT SHORT TERM GOAL #2   Title demonstrate Rt knee AROM flexion to > or = to 90 degrees to improve sitting and negotiating steps without substitution   Time 4   Period Weeks   Status Achieved     PT SHORT TERM GOAL #3   Title wean from walker to use of cane for all distances   Time 4   Period Weeks   Status Achieved     PT SHORT TERM GOAL #4   Title report < or = to 4/10 Rt knee pain with standing and walking   Time 4   Period Weeks   Status Achieved           PT Long Term Goals - 04/10/16 1624      PT LONG TERM GOAL #1   Title be independent in advanced HEP   Time 8   Period Weeks   Status On-going     PT LONG TERM GOAL #2   Title reduce FOTO to < or = to 43% limitation  Time 8   Period Weeks   Status On-going     PT LONG TERM GOAL #3    Title ascend steps with step-over-step with use of 1 rail   Status Achieved     PT LONG TERM GOAL #5   Title imrprove strength and endurance to walk for 45 minutes in the community without need for rest   Status Achieved     PT LONG TERM GOAL #6   Title demonstrate symmetry with ambulation on level surface with use of cane   Time 8   Period Weeks   Status On-going               Plan - 04/10/16 1632    Clinical Impression Statement Pt with continued Rt knee AROM deficits.  Pt with 100 degrees Rt knee PROM and is limited with descending steps due to limited AROM.  Pt is using cane intermittently.  Pt continue to benefit from skilled PT for Rt LE strength, AROM, edema management and endurance activity.     Rehab Potential Good   PT Frequency 2x / week   PT Duration 6 weeks   PT Treatment/Interventions ADLs/Self Care Home Management;Cryotherapy;Electrical Stimulation;Moist Heat;Therapeutic exercise;Therapeutic activities;Functional mobility training;Stair training;Gait training;Ultrasound;Balance training;Neuromuscular re-education;Patient/family education;Manual techniques;Vasopneumatic Device;Taping;Passive range of motion;Scar mobilization   PT Next Visit Plan Knee ROM, stretching, manual technique for ROM, work on stairs; work on Product/process development scientist with Plan of Care Patient      Patient will benefit from skilled therapeutic intervention in order to improve the following deficits and impairments:  Abnormal gait, Decreased range of motion, Decreased endurance, Decreased activity tolerance, Pain, Increased edema, Decreased strength, Impaired flexibility  Visit Diagnosis: Pain in right knee  Muscle weakness (generalized)  Localized edema  Stiffness of right knee, not elsewhere classified  Other abnormalities of gait and mobility     Problem List Patient Active Problem List   Diagnosis Date Noted  . Status post total right knee replacement 12/22/2015   . Hepatic steatosis 02/09/2015  . Hot flashes 09/09/2014  . Osteopenia 09/09/2014  . 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 Memory loss 07/14/2013  . Headache 07/14/2013  . Other malaise and fatigue 07/14/2013  . Acute pulmonary embolism ([place]) 10/07/2011  . Leukocytosis 10/07/2011  . Thyroid mass, left inferior lobe, 3.6cm [id number] 10/07/2011  . Hyponatremia 10/07/2011  . Obesity (BMI 30-39.9) 08/07/2011  . Post concussion syndrome      Sigurd Sos, PT 04/10/16 4:48 PM  [facility] [address] [place], [place], [address] Phone: [phone]   Fax:  [phone]  Name: Alyssa [name] MRN: [id number] Date of Birth: Nov 13, 1950

## 2016-04-12 ENCOUNTER — Encounter: Payer: Self-pay | Admitting: Physical Therapy

## 2016-04-12 ENCOUNTER — Ambulatory Visit: Payer: BC Managed Care – PPO | Admitting: Physical Therapy

## 2016-04-12 DIAGNOSIS — R2689 Other abnormalities of gait and mobility: Secondary | ICD-10-CM

## 2016-04-12 DIAGNOSIS — M6281 Muscle weakness (generalized): Secondary | ICD-10-CM

## 2016-04-12 DIAGNOSIS — R600 Localized edema: Secondary | ICD-10-CM

## 2016-04-12 DIAGNOSIS — M25561 Pain in right knee: Secondary | ICD-10-CM | POA: Diagnosis not present

## 2016-04-12 DIAGNOSIS — M25661 Stiffness of right knee, not elsewhere classified: Secondary | ICD-10-CM

## 2016-04-12 NOTE — Therapy (Signed)
[facility] [address] [place], [place], [address] Phone: [phone]   Fax:  [phone]  Physical Therapy Treatment  Patient Details  Name: Alyssa [name] MRN: [id number] Date of Birth: 17-Mar-1951 Referring Provider: Rodell Perna, MD  Encounter Date: 04/12/2016      PT End of Session - 04/12/16 1653    Visit Number 33   Date for PT Re-Evaluation 04/19/16   PT Start Time 1600   PT Stop Time 1700   PT Time Calculation (min) 60 min   Activity Tolerance Patient tolerated treatment well   Behavior During Therapy [facility] for tasks assessed/performed      Past Medical History:  Diagnosis Date  . Abdominal pain   . Breast cancer ([place]) 06/2013   left upper inner  . Cancer ([place])    breast  . Diabetes mellitus without complication ([place])    Takes Tradjenta  . Family history of adverse reaction to anesthesia    Patients mother had to be resusciated after having anesthesia  . GERD (gastroesophageal reflux disease)   . Headache(784.0)    Takes Depakote  . Hernia, incisional periumbilical, incarcerated 1/32/4401  . Hot flashes   . Hx of radiation therapy 09/09/13- 10/13/13   left breast 5000 cGy in 25 sessions, no boost  . Hyperlipidemia   . Hypertension    PCP DR Nolon Rod  . Hypothyroidism   . Peripheral vascular disease ([place])    pulmonary embolis-still have some  . Post concussion syndrome    4 yrs ago- sees [facility]- Dr Leonie Man every 6 months-   has sleep study 2011 following accident but was neg per patient-  short term memory issues, dates and times  . Pulmonary embolism ([place]) 2013  . Recurrent upper respiratory infection (URI)    FLU 08/23/11-08/29/11 then URI 08/29/11- 09/08/11- S/P zpack and steroids-  improved      no cough or congestion  . Thyroid disease   . Thyroid mass, left inferior lobe, 3.6cm [id number] 10/07/2011  . Ventral hernia     Past Surgical History:  Procedure Laterality Date  . APPENDECTOMY   2002   lap appy.  Dr. Hassell Done  . BREAST LUMPECTOMY WITH NEEDLE LOCALIZATION AND AXILLARY SENTINEL LYMPH NODE BX Left 08/04/2013   Procedure: BREAST LUMPECTOMY WITH NEEDLE LOCALIZATION AND AXILLARY SENTINEL LYMPH NODE Biopsy x 3;  Surgeon: Rolm Bookbinder, MD;  Location: [place];  Service: General;  Laterality: Left;  . BREAST SURGERY    . CARPAL TUNNEL RELEASE  10/2009-12/2010   left - right  . CESAREAN SECTION  X 2  . COLONOSCOPY    . HERNIA REPAIR  09/15/11   Ventral w/mesh  . JOINT REPLACEMENT    . KNEE ARTHROPLASTY Right 12/22/2015   Procedure: COMPUTER ASSISTED TOTAL KNEE ARTHROPLASTY;  Surgeon: Marybelle Killings, MD;  Location: [place];  Service: Orthopedics;  Laterality: Right;  . LAPAROSCOPIC CHOLECYSTECTOMY  2006   Dr. Bubba Camp  . TARSAL TUNNEL RELEASE Bilateral   . THYROIDECTOMY, PARTIAL    . VENTRAL HERNIA REPAIR  09/29/2011   Procedure: LAPAROSCOPIC VENTRAL HERNIA;  Surgeon: Adin Hector, MD;  [facility];  Service: General;  Laterality: N/A;  Laparoscopic Ventral Wall Hernia Repair with Mesh    There were no vitals filed for this visit.      Subjective Assessment - 04/12/16 1614    Subjective I still cant go down stiars. Driving with Rt leg isn't very fluid.    Pertinent History  history of breast cancer   Limitations Standing;Walking   How long can you walk comfortably? 20 minutes or less   Patient Stated Goals improve flexion, wean from walker, improve strength, reduce pain   Currently in Pain? No/denies                         [facility] Adult PT Treatment/Exercise - 04/12/16 0001      Knee/Hip Exercises: Stretches   Active Hamstring Stretch Right;3 reps;30 seconds     Knee/Hip Exercises: Aerobic   Elliptical 4 min 1 min each direction  ramp 5, level 3   Recumbent Bike 7 min  full revolution today with seat further back     Knee/Hip Exercises: Machines for Strengthening   Cybex Knee Extension 15# 2 legs up and down 20x   Cybex Knee Flexion 30# 2 legs  down, right leg up 30x   Cybex Leg Press Seat 7: bil 100# 3x10, Rt LE 40# 2x10     Knee/Hip Exercises: Standing   Heel Raises Right;10 reps  up with 2 down with right   Functional Squat 10 reps     Knee/Hip Exercises: Supine   Heel Slides AAROM;Right;10 reps  With green strap     Modalities   Modalities Vasopneumatic     Vasopneumatic   Number Minutes Vasopneumatic  15 minutes   Vasopnuematic Location  Knee   Vasopneumatic Pressure High   Vasopneumatic Temperature  3 flakes                  PT Short Term Goals - 04/12/16 1651      PT SHORT TERM GOAL #2   Title demonstrate Rt knee AROM flexion to > or = to 90 degrees to improve sitting and negotiating steps without substitution   Time 4   Period Weeks   Status Achieved     PT SHORT TERM GOAL #4   Title report < or = to 4/10 Rt knee pain with standing and walking   Time 4   Period Weeks   Status Achieved           PT Long Term Goals - 04/12/16 1652      PT LONG TERM GOAL #1   Title be independent in advanced HEP   Time 8   Period Weeks   Status On-going     PT LONG TERM GOAL #2   Title reduce FOTO to < or = to 43% limitation   Time 8   Period Weeks   Status On-going     PT LONG TERM GOAL #3   Title ascend steps with step-over-step with use of 1 rail   Time 8   Period Weeks   Status Achieved     PT LONG TERM GOAL #4   Title demonstrate Rt knee AROM -5 to 100 degrees to improve mobility without substitution   Time 8   Period Weeks   Status On-going     PT LONG TERM GOAL #5   Title imrprove strength and endurance to walk for 45 minutes in the community without need for rest   Time 8   Period Weeks   Status Achieved     PT LONG TERM GOAL #6   Title demonstrate symmetry with ambulation on level surface with use of cane   Time 8   Period Weeks   Status On-going               Plan -  04/12/16 1708    Clinical Impression Statement Pt able to do full revolution on stationary bike  today with seat further back. Pt continues to lack knee flexion necessary for stair descending. Pt using cane intermittently. Will continue to increase strength and ROM as tolerated.  Therapist providing verbal cueing for technique and to increase knee flexion.    Rehab Potential Good   Clinical Impairments Affecting Rehab Potential None   PT Frequency 2x / week   PT Duration 6 weeks   PT Treatment/Interventions ADLs/Self Care Home Management;Cryotherapy;Electrical Stimulation;Moist Heat;Therapeutic exercise;Therapeutic activities;Functional mobility training;Stair training;Gait training;Ultrasound;Balance training;Neuromuscular re-education;Patient/family education;Manual techniques;Vasopneumatic Device;Taping;Passive range of motion;Scar mobilization   PT Next Visit Plan Knee ROM, stretching, manual technique for ROM, work on stairs; work on [place] progress as needed   Consulted and Agree with Plan of Care Patient      Patient will benefit from skilled therapeutic intervention in order to improve the following deficits and impairments:  Abnormal gait, Decreased range of motion, Decreased endurance, Decreased activity tolerance, Pain, Increased edema, Decreased strength, Impaired flexibility  Visit Diagnosis: Pain in right knee  Muscle weakness (generalized)  Localized edema  Stiffness of right knee, not elsewhere classified  Other abnormalities of gait and mobility     Problem List Patient Active Problem List   Diagnosis Date Noted  . Status post total right knee replacement 12/22/2015  . Hepatic steatosis 02/09/2015  . Hot flashes 09/09/2014  . Osteopenia 09/09/2014  . Breast cancer of upper-inner quadrant of left female breast ([place]) 07/23/2013  . Memory loss 07/14/2013  . Headache 07/14/2013  . Other malaise and fatigue 07/14/2013  . Acute pulmonary embolism ([place]) 10/07/2011  . Leukocytosis 10/07/2011  . Thyroid mass, left inferior lobe,  3.6cm [id number] 10/07/2011  . Hyponatremia 10/07/2011  . Obesity (BMI 30-39.9) 08/07/2011  . Post concussion syndrome     Mikle Bosworth PTA 04/12/2016, 5:14 PM  [facility] [address] [place], [place], [address] Phone: [phone]   Fax:  [phone]  Name: Alyssa [name] MRN: [id number] Date of Birth: 03-09-1951

## 2016-04-13 ENCOUNTER — Encounter: Payer: Self-pay | Admitting: Physical Therapy

## 2016-04-17 ENCOUNTER — Ambulatory Visit: Payer: BC Managed Care – PPO

## 2016-04-17 DIAGNOSIS — M6281 Muscle weakness (generalized): Secondary | ICD-10-CM

## 2016-04-17 DIAGNOSIS — M25661 Stiffness of right knee, not elsewhere classified: Secondary | ICD-10-CM

## 2016-04-17 DIAGNOSIS — R2689 Other abnormalities of gait and mobility: Secondary | ICD-10-CM

## 2016-04-17 DIAGNOSIS — M25561 Pain in right knee: Secondary | ICD-10-CM

## 2016-04-17 DIAGNOSIS — R600 Localized edema: Secondary | ICD-10-CM

## 2016-04-17 NOTE — Therapy (Signed)
[facility] [address] [place], [place], [address] Phone: [phone]   Fax:  [phone]  Physical Therapy Treatment  Patient Details  Name: Alyssa [name] MRN: [id number] Date of Birth: Sep 18, 1950 Referring Provider: Rodell Perna, MD  Encounter Date: 04/17/2016      PT End of Session - 04/17/16 1632    Visit Number 34   Date for PT Re-Evaluation 04/19/16   PT Start Time 1555   PT Stop Time 1650   PT Time Calculation (min) 55 min   Activity Tolerance Patient tolerated treatment well   Behavior During Therapy [facility] for tasks assessed/performed      Past Medical History:  Diagnosis Date  . Abdominal pain   . Breast cancer ([place]) 06/2013   left upper inner  . Cancer ([place])    breast  . Diabetes mellitus without complication ([place])    Takes Tradjenta  . Family history of adverse reaction to anesthesia    Patients mother had to be resusciated after having anesthesia  . GERD (gastroesophageal reflux disease)   . Headache(784.0)    Takes Depakote  . Hernia, incisional periumbilical, incarcerated 12/23/930  . Hot flashes   . Hx of radiation therapy 09/09/13- 10/13/13   left breast 5000 cGy in 25 sessions, no boost  . Hyperlipidemia   . Hypertension    PCP DR Nolon Rod  . Hypothyroidism   . Peripheral vascular disease ([place])    pulmonary embolis-still have some  . Post concussion syndrome    4 yrs ago- sees [facility]- Dr Leonie Man every 6 months-   has sleep study 2011 following accident but was neg per patient-  short term memory issues, dates and times  . Pulmonary embolism ([place]) 2013  . Recurrent upper respiratory infection (URI)    FLU 08/23/11-08/29/11 then URI 08/29/11- 09/08/11- S/P zpack and steroids-  improved      no cough or congestion  . Thyroid disease   . Thyroid mass, left inferior lobe, 3.6cm [id number] 10/07/2011  . Ventral hernia     Past Surgical History:  Procedure Laterality Date  . APPENDECTOMY   2002   lap appy.  Dr. Hassell Done  . BREAST LUMPECTOMY WITH NEEDLE LOCALIZATION AND AXILLARY SENTINEL LYMPH NODE BX Left 08/04/2013   Procedure: BREAST LUMPECTOMY WITH NEEDLE LOCALIZATION AND AXILLARY SENTINEL LYMPH NODE Biopsy x 3;  Surgeon: Rolm Bookbinder, MD;  Location: [place];  Service: General;  Laterality: Left;  . BREAST SURGERY    . CARPAL TUNNEL RELEASE  10/2009-12/2010   left - right  . CESAREAN SECTION  X 2  . COLONOSCOPY    . HERNIA REPAIR  09/15/11   Ventral w/mesh  . JOINT REPLACEMENT    . KNEE ARTHROPLASTY Right 12/22/2015   Procedure: COMPUTER ASSISTED TOTAL KNEE ARTHROPLASTY;  Surgeon: Marybelle Killings, MD;  Location: [place];  Service: Orthopedics;  Laterality: Right;  . LAPAROSCOPIC CHOLECYSTECTOMY  2006   Dr. Bubba Camp  . TARSAL TUNNEL RELEASE Bilateral   . THYROIDECTOMY, PARTIAL    . VENTRAL HERNIA REPAIR  09/29/2011   Procedure: LAPAROSCOPIC VENTRAL HERNIA;  Surgeon: Adin Hector, MD;  Location: [facility];  Service: General;  Laterality: N/A;  Laparoscopic Ventral Wall Hernia Repair with Mesh    There were no vitals filed for this visit.      Subjective Assessment - 04/17/16 1558    Subjective I have walked >6000 steps today without my cane.     Patient Stated Goals improve flexion,  wean from walker, improve strength, reduce pain   Currently in Pain? Yes   Pain Score 3    Pain Location Knee   Pain Orientation Right   Pain Descriptors / Indicators Aching   Pain Type Chronic pain   Pain Onset More than a month ago   Pain Frequency Intermittent   Aggravating Factors  walking wihtout cane, standing a lot   Pain Relieving Factors rest, ice                         [facility] Adult PT Treatment/Exercise - 04/17/16 0001      Ambulation/Gait   Ambulation/Gait Yes   Ambulation/Gait Assistance 6: Modified independent (Device/Increase time)   Gait Comments 8x25 feet with use of mirror and visual/verbal cues for symmetry and Rt knee flexion during swing through phase  of gait.       Knee/Hip Exercises: Aerobic   Recumbent Bike Level 2x 10 minutes  full revolution today with seat further back     Knee/Hip Exercises: Machines for Strengthening   Cybex Knee Extension 15# 2 legs up and down 20x   Cybex Knee Flexion 30# 2 legs down, right leg up 30x   Cybex Leg Press Seat 7: bil 100# 3x10, Rt LE 40# 2x10     Knee/Hip Exercises: Standing   Heel Raises Right;10 reps  up with 2 down with right   Functional Squat 10 reps     Knee/Hip Exercises: Supine   Heel Slides AAROM;Right;10 reps  With green strap     Modalities   Modalities Vasopneumatic     Vasopneumatic   Number Minutes Vasopneumatic  15 minutes   Vasopnuematic Location  Knee   Vasopneumatic Pressure High   Vasopneumatic Temperature  3 flakes                  PT Short Term Goals - 04/12/16 1651      PT SHORT TERM GOAL #2   Title demonstrate Rt knee AROM flexion to > or = to 90 degrees to improve sitting and negotiating steps without substitution   Time 4   Period Weeks   Status Achieved     PT SHORT TERM GOAL #4   Title report < or = to 4/10 Rt knee pain with standing and walking   Time 4   Period Weeks   Status Achieved           PT Long Term Goals - 04/17/16 1600      PT LONG TERM GOAL #1   Title be independent in advanced HEP   Time 8   Period Weeks   Status On-going     PT LONG TERM GOAL #2   Title reduce FOTO to < or = to 43% limitation   Time 8   Period Weeks   Status On-going     PT LONG TERM GOAL #3   Title ascend steps with step-over-step with use of 1 rail   Status Achieved     PT LONG TERM GOAL #4   Title demonstrate Rt knee AROM -5 to 100 degrees to improve mobility without substitution   Time 8   Period Weeks   Status On-going               Plan - 04/17/16 1601    Clinical Impression Statement Pt with antalgic gait due to limited Rt knee AROM.  Pt is able to make full revolutions on the bicycle with  resistance today.  Pt  continues to lack knee flexion necessary for descending steps.  Pt is working on gait training with Rt knee flexion during swing through phase of gait.  Pt will continue to benefit from skilled PT for Rt knee AROM, strength, endurance and gait.     Rehab Potential Good   PT Frequency 2x / week   PT Duration 6 weeks   PT Treatment/Interventions ADLs/Self Care Home Management;Cryotherapy;Electrical Stimulation;Moist Heat;Therapeutic exercise;Therapeutic activities;Functional mobility training;Stair training;Gait training;Ultrasound;Balance training;Neuromuscular re-education;Patient/family education;Manual techniques;Vasopneumatic Device;Taping;Passive range of motion;Scar mobilization   PT Next Visit Plan Knee ROM, stretching, manual technique for ROM, work on stairs; work on Product/process development scientist with Plan of Care Patient      Patient will benefit from skilled therapeutic intervention in order to improve the following deficits and impairments:  Abnormal gait, Decreased range of motion, Decreased endurance, Decreased activity tolerance, Pain, Increased edema, Decreased strength, Impaired flexibility  Visit Diagnosis: Pain in right knee  Muscle weakness (generalized)  Localized edema  Stiffness of right knee, not elsewhere classified  Other abnormalities of gait and mobility     Problem List Patient Active Problem List   Diagnosis Date Noted  . Status post total right knee replacement 12/22/2015  . Hepatic steatosis 02/09/2015  . Hot flashes 09/09/2014  . Osteopenia 09/09/2014  . 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 Memory loss 07/14/2013  . Headache 07/14/2013  . Other malaise and fatigue 07/14/2013  . Acute pulmonary embolism ([place]) 10/07/2011  . Leukocytosis 10/07/2011  . Thyroid mass, left inferior lobe, 3.6cm [id number] 10/07/2011  . Hyponatremia 10/07/2011  . Obesity (BMI 30-39.9) 08/07/2011  . Post concussion  syndrome      Alyssa [name], PT 04/17/16 4:37 PM  [facility] [address] [place], [place], [address] Phone: [phone]   Fax:  [phone]  Name: Alyssa [name] MRN: [id number] Date of Birth: 02/11/1951

## 2016-04-19 ENCOUNTER — Ambulatory Visit: Payer: BC Managed Care – PPO

## 2016-04-19 DIAGNOSIS — M6281 Muscle weakness (generalized): Secondary | ICD-10-CM

## 2016-04-19 DIAGNOSIS — M25661 Stiffness of right knee, not elsewhere classified: Secondary | ICD-10-CM

## 2016-04-19 DIAGNOSIS — M25561 Pain in right knee: Secondary | ICD-10-CM

## 2016-04-19 DIAGNOSIS — R600 Localized edema: Secondary | ICD-10-CM

## 2016-04-19 NOTE — Therapy (Addendum)
[facility] [address], [place], [place], [address] Phone: [phone]   Fax:  [phone]  Physical Therapy Treatment  Patient Details  Name: Alyssa [name] MRN: [id number] Date of Birth: 04/14/1951 Referring Provider: Rodell Perna, MD  Encounter Date: 04/19/2016      PT End of Session - 04/19/16 1644    Visit Number 35   Date for PT Re-Evaluation 04/19/16   PT Start Time 2620   PT Stop Time 1656   PT Time Calculation (min) 63 min   Behavior During Therapy [facility] for tasks assessed/performed      Past Medical History:  Diagnosis Date  . Abdominal pain   . Breast cancer ([place]) 06/2013   left upper inner  . Cancer ([employer])    breast  . Diabetes mellitus without complication ([place])    Takes Tradjenta  . Family history of adverse reaction to anesthesia    Patients mother had to be resusciated after having anesthesia  . GERD (gastroesophageal reflux disease)   . Headache(784.0)    Takes Depakote  . Hernia, incisional periumbilical, incarcerated 3/55/9741  . Hot flashes   . Hx of radiation therapy 09/09/13- 10/13/13   left breast 5000 cGy in 25 sessions, no boost  . Hyperlipidemia   . Hypertension    PCP DR Nolon Rod  . Hypothyroidism   . Peripheral vascular disease ([place])    pulmonary embolis-still have some  . Post concussion syndrome    4 yrs ago- sees [facility]- Dr Leonie Man every 6 months-   has sleep study 2011 following accident but was neg per patient-  short term memory issues, dates and times  . Pulmonary embolism ([place]) 2013  . Recurrent upper respiratory infection (URI)    FLU 08/23/11-08/29/11 then URI 08/29/11- 09/08/11- S/P zpack and steroids-  improved      no cough or congestion  . Thyroid disease   . 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 Ventral hernia     Past Surgical History:  Procedure Laterality Date  . APPENDECTOMY  2002   lap appy.  Dr. Hassell Done  . BREAST LUMPECTOMY  WITH NEEDLE LOCALIZATION AND AXILLARY SENTINEL LYMPH NODE BX Left 08/04/2013   Procedure: BREAST LUMPECTOMY WITH NEEDLE LOCALIZATION AND AXILLARY SENTINEL LYMPH NODE Biopsy x 3;  Surgeon: Rolm Bookbinder, MD;  Location: [facility];  Service: General;  Laterality: Left;  . BREAST SURGERY    . CARPAL TUNNEL RELEASE  10/2009-12/2010   left - right  . CESAREAN SECTION  X 2  . COLONOSCOPY    . HERNIA REPAIR  09/15/11   Ventral w/mesh  . JOINT REPLACEMENT    . KNEE ARTHROPLASTY Right 12/22/2015   Procedure: COMPUTER ASSISTED TOTAL KNEE ARTHROPLASTY;  Surgeon: Marybelle Killings, MD;  Location: [place];  Service: Orthopedics;  Laterality: Right;  . LAPAROSCOPIC CHOLECYSTECTOMY  2006   Dr. Bubba Camp  . TARSAL TUNNEL RELEASE Bilateral   . THYROIDECTOMY, PARTIAL    . VENTRAL HERNIA REPAIR  09/29/2011   Procedure: LAPAROSCOPIC VENTRAL HERNIA;  Surgeon: Adin Hector, MD;  [facility];  Service: General;  Laterality: N/A;  Laparoscopic Ventral Wall Hernia Repair with Mesh    There were no vitals filed for this visit.      Subjective Assessment - 04/19/16 1623    Subjective Pt is seeing MD in October.  Will place on hold until pt sees MD.  Probable D/C   Pertinent History history of breast cancer  Patient Stated Goals improve flexion, wean from walker, improve strength, reduce pain   Currently in Pain? Yes   Pain Score 2    Pain Location Knee   Pain Orientation Right   Pain Descriptors / Indicators Aching   Pain Type Chronic pain   Pain Onset More than a month ago   Pain Frequency Intermittent   Aggravating Factors  walking without cane, standing    Pain Relieving Factors rest, ice            [facility] PT Assessment - 04/19/16 0001      Assessment   Medical Diagnosis TKA Rt with poor flexion     Prior Function   Level of Independence Independent   Vocation Full time employment     Cognition   Overall Cognitive Status Within Functional Limits for tasks assessed     Observation/Other  Assessments   Focus on Therapeutic Outcomes (FOTO)  55% limitation     AROM   Right Knee Extension 10   Right Knee Flexion 104                     [facility] Adult PT Treatment/Exercise - 04/19/16 0001      Knee/Hip Exercises: Aerobic   Recumbent Bike Level 2x 10 minutes  full revolution today with seat further back     Knee/Hip Exercises: Machines for Strengthening   Cybex Knee Extension 10# 2 legs up and down 20x   Cybex Knee Flexion 30# 2 legs down, right leg up 30x   Cybex Leg Press Seat 7: bil 100# 3x10, Rt LE 40# 2x10     Knee/Hip Exercises: Standing   Step Down Left;2 sets;10 reps;Hand Hold: 1;Step Height: 6"     Knee/Hip Exercises: Supine   Heel Slides AAROM;Right;10 reps  With green strap     Vasopneumatic   Number Minutes Vasopneumatic  15 minutes   Vasopnuematic Location  Knee   Vasopneumatic Pressure High   Vasopneumatic Temperature  3 flakes                  PT Short Term Goals - 04/12/16 1651      PT SHORT TERM GOAL #2   Title demonstrate Rt knee AROM flexion to > or = to 90 degrees to improve sitting and negotiating steps without substitution   Time 4   Period Weeks   Status Achieved     PT SHORT TERM GOAL #4   Title report < or = to 4/10 Rt knee pain with standing and walking   Time 4   Period Weeks   Status Achieved           PT Long Term Goals - 04/19/16 1628      PT LONG TERM GOAL #1   Title be independent in advanced HEP   Status Achieved     PT LONG TERM GOAL #2   Title reduce FOTO to < or = to 43% limitation   Status Partially Met     PT LONG TERM GOAL #3   Title ascend steps with step-over-step with use of 1 rail   Status Achieved     PT LONG TERM GOAL #4   Title demonstrate Rt knee AROM -5 to 100 degrees to improve mobility without substitution   Time 8   Period Weeks   Status On-going     PT LONG TERM GOAL #5   Title imrprove strength and endurance to walk for 45 minutes in the community  without need  for rest   Status Achieved     PT LONG TERM GOAL #6   Title demonstrate symmetry with ambulation on level surface with use of cane   Time 8   Period Weeks   Status On-going               Plan - 04/19/16 1633    Clinical Impression Statement Pt with continued antalgic gait due to limited Rt knee A/ROM.  Pt with Rt knee AROM -10 to 104 degrees.  Pt with improved step-down today with more available Rt knee flexion.  Pt will continue with HEP and will see MD next month.  D/C is recommended.     Rehab Potential Good   PT Frequency 2x / week   PT Duration 6 weeks   PT Treatment/Interventions ADLs/Self Care Home Management;Cryotherapy;Electrical Stimulation;Moist Heat;Therapeutic exercise;Therapeutic activities;Functional mobility training;Stair training;Gait training;Ultrasound;Balance training;Neuromuscular re-education;Patient/family education;Manual techniques;Vasopneumatic Device;Taping;Passive range of motion;Scar mobilization   PT Next Visit Plan See what MD says on 05/09/16.  PT is recommending D/C to HEP   Consulted and Agree with Plan of Care Patient      Patient will benefit from skilled therapeutic intervention in order to improve the following deficits and impairments:  Abnormal gait, Decreased range of motion, Decreased endurance, Decreased activity tolerance, Pain, Increased edema, Decreased strength, Impaired flexibility  Visit Diagnosis: Pain in right knee  Muscle weakness (generalized)  Localized edema  Stiffness of right knee, not elsewhere classified     Problem List Patient Active Problem List   Diagnosis Date Noted  . Status post total right knee replacement 12/22/2015  . Hepatic steatosis 02/09/2015  . Hot flashes 09/09/2014  . Osteopenia 09/09/2014  . Breast cancer of upper-inner quadrant of left female breast ([place]) 07/23/2013  . Memory loss 07/14/2013  . Headache 07/14/2013  . Other malaise and fatigue 07/14/2013  . Acute pulmonary embolism  ([place]) 10/07/2011  . Leukocytosis 10/07/2011  . 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 Hyponatremia 10/07/2011  . Obesity (BMI 30-39.9) 08/07/2011  . Post concussion syndrome     Sigurd Sos, PT 04/19/16 4:46 PM PHYSICAL THERAPY DISCHARGE SUMMARY  Visits from Start of Care: 35  Current functional level related to goals / functional outcomes: See above for most current status.  Pt will D/C to HEP.   Remaining deficits: See above.  Pt will continue with HEP for continued gains.     Education / Equipment: HEP  Plan: Patient agrees to discharge.  Patient goals were partially met. Patient is being discharged due to being pleased with the current functional level.  ?????        Sigurd Sos, PT 05/16/16 8:44 AM   [facility] [address], [place], [place], [address] Phone: [phone]   Fax:  [phone]  Name: Alyssa [name] MRN: [id number] Date of Birth: 1950/12/21

## 2016-05-09 ENCOUNTER — Ambulatory Visit (INDEPENDENT_AMBULATORY_CARE_PROVIDER_SITE_OTHER): Payer: BC Managed Care – PPO | Admitting: Orthopaedic Surgery

## 2016-05-09 DIAGNOSIS — M25561 Pain in right knee: Secondary | ICD-10-CM | POA: Diagnosis not present

## 2016-05-09 DIAGNOSIS — M25562 Pain in left knee: Secondary | ICD-10-CM | POA: Diagnosis not present

## 2016-06-04 ENCOUNTER — Other Ambulatory Visit: Payer: Self-pay | Admitting: Oncology

## 2016-07-12 ENCOUNTER — Telehealth: Payer: Self-pay | Admitting: *Deleted

## 2016-07-12 NOTE — Telephone Encounter (Signed)
Call from pt requesting to reschedule lab appt. She reports she has left several messages. Pt is scheduled to see Dr. Jana Hakim 07/27/16. Requests to do lab same day. Message to schedulers. Instructed pt to come in one hour prior to office for lab. She voiced understanding.

## 2016-07-13 ENCOUNTER — Ambulatory Visit: Payer: BC Managed Care – PPO | Admitting: Nurse Practitioner

## 2016-07-18 ENCOUNTER — Other Ambulatory Visit: Payer: Self-pay

## 2016-07-18 ENCOUNTER — Other Ambulatory Visit: Payer: Self-pay | Admitting: Nurse Practitioner

## 2016-07-19 ENCOUNTER — Encounter: Payer: Self-pay | Admitting: Nurse Practitioner

## 2016-07-19 ENCOUNTER — Ambulatory Visit
Admission: RE | Admit: 2016-07-19 | Discharge: 2016-07-19 | Disposition: A | Discharge status: Home / Self Care | Payer: BC Managed Care – PPO | Source: Ambulatory Visit | Attending: Adult Health | Admitting: Adult Health

## 2016-07-19 ENCOUNTER — Ambulatory Visit (INDEPENDENT_AMBULATORY_CARE_PROVIDER_SITE_OTHER): Payer: BC Managed Care – PPO | Admitting: Nurse Practitioner

## 2016-07-19 VITALS — BP 127/68 | HR 71 | Ht 62.5 in | Wt 223.6 lb

## 2016-07-19 DIAGNOSIS — R413 Other amnesia: Secondary | ICD-10-CM | POA: Diagnosis not present

## 2016-07-19 DIAGNOSIS — C50212 Malignant neoplasm of upper-inner quadrant of left female breast: Secondary | ICD-10-CM

## 2016-07-19 DIAGNOSIS — R51 Headache: Secondary | ICD-10-CM

## 2016-07-19 DIAGNOSIS — R519 Headache, unspecified: Secondary | ICD-10-CM

## 2016-07-19 MED ORDER — DIVALPROEX SODIUM 500 MG PO DR TAB: 500.0000 mg | DELAYED_RELEASE_TABLET | Freq: Every day | ORAL | 3 refills | Status: DC

## 2016-07-19 NOTE — Patient Instructions (Signed)
Continue Depakote as ordered will refill for 1 year, this is for headache preventative Memory score remains stable F/U yearly

## 2016-07-19 NOTE — Progress Notes (Addendum)
[facility]  PATIENT: Alyssa [name] DOB: 1950-07-28   REASON FOR VISIT: Follow-up for memory loss, post concussion syndrome HISTORY FROM: Patient    HISTORY OF PRESENT ILLNESS:UPDATE 07/19/16 CMMs. Alyssa [name], 65 year old [name] returns for yearly followup.  Patient fell off of the chair at school back in June2015 and hit her head. CT of the head at that time without abnormalities her headaches returned and she started taking her Depakote again with good benefit. Her memory is stable per MMSE. 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  She has not had any issues performing  her job. She had breast cancer surgery January 2015. She had knee replacement on the right 12/22/2015 She returns for reevaluation and refills   HISTORY: of cognitive changes post head injury. She was initially evaluated by Dr. Leonie Man in September of 2009. She was operating a push mower when she was going down a ditch in front of her home and toppled over the handle and hit her head on the motor of the mower. At that time she was not sure she loss consciousness or not,she was able to get up go indoors. She did not seek medical help that day, however she did see her primary care the next day when she was complaining of severe headache, appeared to be confused and disoriented and did not recognize the Dr. CT of the head showed no acute abnormality. She has since had an MRI of the brain which was read as normal as well as an EEG that was normal. She returns today continuing to complain with short-term memory, attention, and concentration difficulties but she continues to work and she has not had any difficulty performing her job duties. She says she manages by using sticky notes, so that she will not forget things. She denies any depression ,she is trying to exercise and has lost weight since last seen. She does  complain of early morning headaches and fatigue, does not feel rested after sleeping although that is better with Trazodone. She is currently on Depakote 500 daily 2 daily for headaches.Her headaches have returned since she is back to teaching after being off for the summer. She says these are stress related. She does not want to increase medication. There is no nausea, photophobia, with the headaches. 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 Fell several weeks ago and hit the back of the head. No obvious injury. See ROS  07/18/12: Returns for followup. MMSE stable. Went off Depakote for a few weeks and headaches returned and mood was terrible. Her PCP gave her a RX to get back on the Depakote. Headaches are stable at present. Continues to work with out problems with performing job. Had hernia repair in March and complication of PE after surgery. Has just gotten off coumadin. No new complaints   REVIEW OF SYSTEMS: Full 14 system review of systems performed and notable only for those listed, all others are neg:  Constitutional: neg  Cardiovascular: neg Ear/Nose/Throat: neg  Skin: neg Eyes: neg Respiratory: neg Gastroitestinal: neg  Hematology/Lymphatic: neg  Endocrine: neg Musculoskeletal:neg Allergy/Immunology: neg Neurological: Memory loss, headaches Psychiatric: neg Sleep : neg   ALLERGIES: Allergies  Allergen Reactions  . Aleve [Naproxen Sodium] Hives, Itching and Swelling  . Aromasin [Exemestane] Anaphylaxis, Itching and Swelling    Pt currently tolerating medication well (02/03/16)-gwd; Pt stated "I can take this medication  for a few months at a time and I take a benadryl when I feel it starting to come on" Patient has facial swelling, itching, and throat swelling  . Letrozole Shortness Of Breath, Rash and Other (See Comments)    Swelling - feet, eyes,hands;   Speech garbled  . Levofloxacin Rash and Swelling    Facial swelling and red facial rash  .  Penicillins Hives, Swelling and Rash    All over the body. Has patient had a PCN reaction causing immediate rash, facial/tongue/throat swelling, SOB or lightheadedness with hypotension: Yes Has patient had a PCN reaction causing severe rash involving mucus membranes or skin necrosis: No Has patient had a PCN reaction that required hospitalization No Has patient had a PCN reaction occurring within the last 10 years: No If all of the above answers are "NO", then may proceed with Cephalosporin use.   . Codeine Other (See Comments)    hallucinations  . Other Swelling    Swelling of  Eyes   Wheat ,oranges banana, zucchini,cayenne,chili  Peppers, sweet potatoes, pumpkin  . Percocet [Oxycodone-Acetaminophen] Other (See Comments)    Causes syncope day after medication has been taken.  . Vicodin [Hydrocodone-Acetaminophen] Other (See Comments)    Causes syncope day after medication has been taken.  . Prednisone Other (See Comments)    Unknown    HOME MEDICATIONS: Outpatient Medications Prior to Visit  Medication Sig Dispense Refill  . ALPRAZolam (XANAX) 0.5 MG tablet Take 0.5 mg by mouth at bedtime as needed for anxiety or sleep.     . Coenzyme Q10 (CO Q 10) 100 MG CAPS Take 200 mg by mouth daily.    . divalproex (DEPAKOTE) 500 MG DR tablet Take 500 mg by mouth daily.    Marland Kitchen exemestane (AROMASIN) 25 MG tablet Take 1 tablet (25 mg total) by mouth daily after breakfast. 90 tablet 4  . exemestane (AROMASIN) 25 MG tablet TAKE 1 TABLET(25 MG) BY MOUTH DAILY AFTER BREAKFAST 90 tablet 0  . fluticasone (FLONASE) 50 MCG/ACT nasal spray Reported on 02/03/2016    . glucose blood (ONETOUCH VERIO) test strip     . hydrochlorothiazide (HYDRODIURIL) 25 MG tablet Take 25 mg by mouth at bedtime.     Marland Kitchen linagliptin (TRADJENTA) 5 MG TABS tablet Take 5 mg by mouth daily.    Marland Kitchen lisinopril (PRINIVIL,ZESTRIL) 5 MG tablet Take 5 mg by mouth daily.    Marland Kitchen NATURE-THROID 97.5 MG TABS Take 48.75-97.5 mg by mouth 2 (two) times  daily. 97.43m in the morning, 48.763min the evening    . ONETOUCH DELICA LANCETS FINE MISC     . rosuvastatin (CRESTOR) 5 MG tablet Take 5 mg by mouth daily.    . traMADol (ULTRAM) 50 MG tablet Take 1 tablet (50 mg total) by mouth daily as needed. 30 tablet 0  . traZODone (DESYREL) 100 MG tablet Take 100 mg by mouth.     No facility-administered medications prior to visit.     PAST MEDICAL HISTORY: Past Medical History:  Diagnosis Date  . Abdominal pain   . Breast cancer ([facility]/2014   left upper inner  . Cancer ([facility]   breast  . Diabetes mellitus without complication ([facility]   Takes Tradjenta  . Family history of adverse reaction to anesthesia    Patients mother had to be resusciated after having anesthesia  . GERD (gastroesophageal reflux disease)   . Headache(784.0)    Takes Depakote  . Hernia, incisional periumbilical, incarcerated 07/31/44/6599.  Hot flashes   . Hx of radiation therapy 09/09/13- 10/13/13   left breast 5000 cGy in 25 sessions, no boost  . Hyperlipidemia   . Hypertension    PCP DR Nolon Rod  . Hypothyroidism   . Peripheral vascular disease ([place])    pulmonary embolis-still have some  . Post concussion syndrome    4 yrs ago- sees [facility]- Dr Leonie Man every 6 months-   has sleep study 2011 following accident but was neg per patient-  short term memory issues, dates and times  . Pulmonary embolism ([place]) 2013  . Recurrent upper respiratory infection (URI)    FLU 08/23/11-08/29/11 then URI 08/29/11- 09/08/11- S/P zpack and steroids-  improved      no cough or congestion  . Thyroid disease   . 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 Ventral hernia     PAST SURGICAL HISTORY: Past Surgical History:  Procedure Laterality Date  . APPENDECTOMY  2002   lap appy.  Dr. Hassell Done  . BREAST LUMPECTOMY WITH NEEDLE LOCALIZATION AND AXILLARY SENTINEL LYMPH NODE BX Left 08/04/2013   Procedure: BREAST LUMPECTOMY WITH NEEDLE LOCALIZATION AND AXILLARY  SENTINEL LYMPH NODE Biopsy x 3;  Surgeon: Rolm Bookbinder, MD;  Location: [facility];  Service: General;  Laterality: Left;  . BREAST SURGERY    . CARPAL TUNNEL RELEASE  10/2009-12/2010   left - right  . CESAREAN SECTION  X 2  . COLONOSCOPY    . HERNIA REPAIR  09/15/11   Ventral w/mesh  . JOINT REPLACEMENT    . KNEE ARTHROPLASTY Right 12/22/2015   Procedure: COMPUTER ASSISTED TOTAL KNEE ARTHROPLASTY;  Surgeon: Marybelle Killings, MD;  Location: [facility];  Service: Orthopedics;  Laterality: Right;  . LAPAROSCOPIC CHOLECYSTECTOMY  2006   Dr. Bubba Camp  . TARSAL TUNNEL RELEASE Bilateral   . THYROIDECTOMY, PARTIAL    . VENTRAL HERNIA REPAIR  09/29/2011   Procedure: LAPAROSCOPIC VENTRAL HERNIA;  Surgeon: Adin Hector, MD;  Location: [facility];  Service: General;  Laterality: N/A;  Laparoscopic Ventral Wall Hernia Repair with Mesh    FAMILY HISTORY: Family History  Problem Relation Age of Onset  . Malignant hyperthermia Mother   . Heart attack Father   . Stroke Father   . Diabetes      Grandmother  . Other      respiratory- Grandmother  . Cancer Maternal Grandmother 50    breast    SOCIAL HISTORY: Social History   Social History  . Marital status: Divorced    Spouse name: N/A  . Number of children: 2  . Years of education: Master's   Occupational History  .  [place] History Main Topics  . Smoking status: Never Smoker  . Smokeless tobacco: Never Used  . Alcohol use No  . Drug use: No  . Sexual activity: Yes     Comment: Menarche age 50, first live birth age 43,  [place] P2. She stopped having periods approximately 1999 and took HRT until 2005.   Other Topics Concern  . Not on file   Social History Narrative   Patient is divorced and lives alone.   Patient has two children.   Patient is a Pharmacist, hospital in [employer].   Patient has a Master's degree.   Patient drinks 3-4 cups of caffeine daily.    Patient is right handed.     PHYSICAL  EXAM  Vitals:   07/19/16 1412  BP: 127/68  Pulse: 71  Weight: 223 lb 9.6 oz (101.4 kg)  Height: 5' 2.5" (1.588 m)   Body mass index is 40.25 kg/m. Generalized: Well developed, obese [name] in no acute distress  Head: normocephalic and atraumatic,. Oropharynx benign  Neck: Supple, no carotid bruits  Cardiac: Regular rate rhythm, no murmur  Musculoskeletal: No deformity   Neurological examination   Mentation: Alert oriented to time, place, history taking. MMSE 29/30. AFT 13. Clock drawing 4 out of 4 .Follows all commands speech and language fluent  Cranial nerve II-XII: Pupils were equal round reactive to light extraocular movements were full, visual field were full on confrontational test. Facial sensation and strength were normal. hearing was intact to finger rubbing bilaterally. Uvula tongue midline. head turning and shoulder shrug were normal and symmetric.Tongue protrusion into cheek strength was normal. Motor: normal bulk and tone, full strength in the BUE, BLE, fine finger movements normal, no pronator drift. No focal weakness Sensory: normal and symmetric to light touch, pinprick, and vibration in the upper and lower extremities Coordination: finger-nose-finger, heel-to-shin bilaterally, no dysmetria Reflexes: 1+ upper lower and symmetric Gait and Station: Rising up from seated position without assistance, normal stance, moderate stride, good arm swing, smooth turning, able to perform tiptoe, and heel walking without difficulty. Tandem gait is steady  DIAGNOSTIC DATA (LABS, IMAGING, TESTING) - I reviewed patient records, labs, notes, testing and imaging myself where available.  Lab Results  Component Value Date   WBC 8.6 12/25/2015   HGB 11.7 (L) 12/25/2015   HCT 38.4 12/25/2015   MCV 90.6 12/25/2015   PLT 216 12/25/2015      Component Value Date/Time   NA 137 12/23/2015 0500   NA 142 11/17/2015 1548   K 4.1 12/23/2015 0500   K 3.4 (L) 11/17/2015 1548   CL  102 12/23/2015 0500   CO2 27 12/23/2015 0500   CO2 31 (H) 11/17/2015 1548   GLUCOSE 118 (H) 12/23/2015 0500   GLUCOSE 91 11/17/2015 1548   BUN 8 12/23/2015 0500   BUN 15.0 11/17/2015 1548   CREATININE 0.86 12/23/2015 0500   CREATININE 0.8 11/17/2015 1548   CALCIUM 8.2 (L) 12/23/2015 0500   CALCIUM 9.9 11/17/2015 1548   PROT 7.1 12/10/2015 1556   PROT 7.4 11/17/2015 1548   ALBUMIN 3.9 12/10/2015 1556   ALBUMIN 4.0 11/17/2015 1548   AST 39 12/10/2015 1556   AST 34 11/17/2015 1548   ALT 50 12/10/2015 1556   ALT 50 11/17/2015 1548   ALKPHOS 57 12/10/2015 1556   ALKPHOS 58 11/17/2015 1548   BILITOT 1.0 12/10/2015 1556   BILITOT 0.98 11/17/2015 1548   GFRNONAA >60 12/23/2015 0500   GFRAA >60 12/23/2015 0500    ASSESSMENT AND PLAN  65 y.o. year old [name]  has a past medical history of Hyperlipidemia; Post concussion syndrome;  Hypertension; Headache(784.0);  here to follow-up for her memory loss which is stable.The patient is a current patient of Dr. Leonie Man  who is out of the office today . This note is sent to the work in doctor.     PLANContinue Depakote as ordered will refill for 1 year, this is for headache preventative Memory score remains stable F/U yearly Alyssa [name], [facility], [facility], [facility] [address] [place], [place] [address] [phone]  I reviewed the above note and documentation by the Nurse Practitioner and agree with the history, physical exam, assessment and plan as outlined above. I was immediately available for face-to-face consultation.  Star Age, MD, PhD [facility])

## 2016-07-20 ENCOUNTER — Other Ambulatory Visit: Payer: Self-pay

## 2016-07-27 ENCOUNTER — Ambulatory Visit (HOSPITAL_BASED_OUTPATIENT_CLINIC_OR_DEPARTMENT_OTHER): Payer: BC Managed Care – PPO | Admitting: Oncology

## 2016-07-27 ENCOUNTER — Other Ambulatory Visit: Payer: Self-pay | Admitting: *Deleted

## 2016-07-27 ENCOUNTER — Other Ambulatory Visit (HOSPITAL_BASED_OUTPATIENT_CLINIC_OR_DEPARTMENT_OTHER): Payer: BC Managed Care – PPO

## 2016-07-27 VITALS — BP 137/68 | HR 72 | Temp 98.0°F | Resp 20 | Wt 222.4 lb

## 2016-07-27 DIAGNOSIS — C50212 Malignant neoplasm of upper-inner quadrant of left female breast: Secondary | ICD-10-CM

## 2016-07-27 DIAGNOSIS — Z79811 Long term (current) use of aromatase inhibitors: Secondary | ICD-10-CM | POA: Diagnosis not present

## 2016-07-27 DIAGNOSIS — Z86711 Personal history of pulmonary embolism: Secondary | ICD-10-CM

## 2016-07-27 DIAGNOSIS — M8580 Other specified disorders of bone density and structure, unspecified site: Secondary | ICD-10-CM | POA: Diagnosis not present

## 2016-07-27 DIAGNOSIS — Z170 Estrogen receptor positive status [ER+]: Secondary | ICD-10-CM | POA: Diagnosis not present

## 2016-07-27 LAB — COMPREHENSIVE METABOLIC PANEL
ALBUMIN: 4.1 g/dL (ref 3.5–5.0)
ALK PHOS: 59 U/L (ref 40–150)
ALT: 27 U/L (ref 0–55)
AST: 21 U/L (ref 5–34)
Anion Gap: 12 mEq/L — ABNORMAL HIGH (ref 3–11)
BILIRUBIN TOTAL: 0.75 mg/dL (ref 0.20–1.20)
BUN: 15.4 mg/dL (ref 7.0–26.0)
CALCIUM: 9.8 mg/dL (ref 8.4–10.4)
CO2: 29 mEq/L (ref 22–29)
Chloride: 99 mEq/L (ref 98–109)
Creatinine: 0.8 mg/dL (ref 0.6–1.1)
EGFR: 79 mL/min/{1.73_m2} — AB (ref >90)
GLUCOSE: 85 mg/dL (ref 70–140)
Potassium: 3.4 mEq/L — ABNORMAL LOW (ref 3.5–5.1)
SODIUM: 140 meq/L (ref 136–145)
TOTAL PROTEIN: 7.4 g/dL (ref 6.4–8.3)

## 2016-07-27 LAB — CBC WITH DIFFERENTIAL/PLATELET
BASO%: 1.1 % (ref 0.0–2.0)
Basophils Absolute: 0.1 10*3/uL (ref 0.0–0.1)
EOS%: 1.6 % (ref 0.0–7.0)
Eosinophils Absolute: 0.1 10*3/uL (ref 0.0–0.5)
HCT: 41.9 % (ref 34.8–46.6)
HGB: 13.9 g/dL (ref 11.6–15.9)
LYMPH%: 36.9 % (ref 14.0–49.7)
MCH: 28.6 pg (ref 25.1–34.0)
MCHC: 33.2 g/dL (ref 31.5–36.0)
MCV: 86 fL (ref 79.5–101.0)
MONO#: 0.4 10*3/uL (ref 0.1–0.9)
MONO%: 8.2 % (ref 0.0–14.0)
NEUT#: 2.7 10*3/uL (ref 1.5–6.5)
NEUT%: 52.2 % (ref 38.4–76.8)
Platelets: 238 10*3/uL (ref 145–400)
RBC: 4.87 10*6/uL (ref 3.70–5.45)
RDW: 13.8 % (ref 11.2–14.5)
WBC: 5.3 10*3/uL (ref 3.9–10.3)
lymph#: 1.9 10*3/uL (ref 0.9–3.3)

## 2016-07-27 NOTE — Progress Notes (Signed)
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 Left diagnostic mammography and ultrasonography 07/21/2013 showed an irregular high-density mass in the upper inner quadrant of the left breast which was palpable at the 11:00 position 5 cm from the nipple. Ultrasound confirmed an irregular hypoechoic mass measuring 1.1 cm. The left axilla was unremarkable.  Biopsy of this mass was obtained the same day and showed ([id number]) an invasive ductal carcinoma, grade 1, estrogen and progesterone receptor both strongly positive by verbal report, with no HER-2 amplification (signals ratio 1.19, number per cell 1.85) and an MIB-1 of 5%.  The patient's subsequent history is as detailed below.  INTERVAL HISTORY: Lamees returns today for follow up of her left-sided breast cancer. She continues on exemestane, which she tolerates well. The one side effects she is probably having from this, although other causes are also possible, is hair loss. This is likely not readily apparent. Hot flashes and vaginal dryness are not a major issue. She never developed the arthralgias or myalgias that many patients can experience on this medication. She obtains it at a good price.  REVIEW OF SYSTEMS: Overall Mikahla is doing "good". She had a total knee replacement under [facility] Perna and after 6 months or so rehabilitation she continues to use her elliptical regularly and does her physical therapy exercises. As a result she has lost  quite a bit of weight and feels much better about herself. She still has night sweats, but these are very mild. She tells me her sugars are well controlled. She enjoys her work. A detailed review of systems today was otherwise stable  PAST MEDICAL HISTORY: Past Medical History:  Diagnosis Date  . Abdominal pain   . Breast cancer ([place]) 06/2013   left upper inner  . Cancer ([place])    breast  . Diabetes mellitus without complication ([place])    Takes Tradjenta  . Family history of adverse reaction to anesthesia    Patients mother had to be resusciated after having anesthesia  . GERD (gastroesophageal reflux disease)   . Headache(784.0)    Takes Depakote  . Hernia, incisional periumbilical, incarcerated 4/00/8676  . Hot flashes   . Hx of radiation therapy 09/09/13- 10/13/13   left breast 5000 cGy in 25 sessions, no boost  . Hyperlipidemia   . Hypertension    PCP DR Nolon Rod  . Hypothyroidism   . Peripheral vascular disease ([place])    pulmonary embolis-still have some  . Post concussion syndrome    4 yrs ago- sees [facility]- Dr Leonie Man every 6 months-   has sleep study 2011 following accident but was neg per patient-  short term memory issues, dates and times  . Pulmonary embolism ([place]) 2013  . Recurrent upper respiratory infection (URI)    FLU 08/23/11-08/29/11 then URI 08/29/11- 09/08/11- S/P zpack and steroids-  improved      no cough or congestion  . Thyroid disease   . Thyroid mass, left inferior lobe, 3.6cm [id number] 10/07/2011  . 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 APPENDECTOMY  2002   lap appy.  Dr. Hassell Done  . BREAST LUMPECTOMY WITH NEEDLE LOCALIZATION AND AXILLARY SENTINEL LYMPH NODE BX Left 08/04/2013   Procedure: BREAST LUMPECTOMY WITH NEEDLE LOCALIZATION AND AXILLARY SENTINEL LYMPH NODE Biopsy x 3;  Surgeon: Rolm Bookbinder, MD;  Location:  [facility];  Service: General;  Laterality: Left;  . BREAST SURGERY    . CARPAL TUNNEL RELEASE  10/2009-12/2010   left - right  . CESAREAN SECTION  X 2  . COLONOSCOPY    . HERNIA REPAIR  09/15/11   Ventral w/mesh  . JOINT REPLACEMENT    . KNEE ARTHROPLASTY Right 12/22/2015   Procedure: COMPUTER ASSISTED TOTAL KNEE ARTHROPLASTY;  Surgeon: Marybelle Killings, MD;  Location: [facility];  Service: Orthopedics;  Laterality: Right;  . LAPAROSCOPIC CHOLECYSTECTOMY  2006   Dr. Bubba Camp  . TARSAL TUNNEL RELEASE Bilateral   . THYROIDECTOMY, PARTIAL    . VENTRAL HERNIA REPAIR  09/29/2011   Procedure: LAPAROSCOPIC VENTRAL HERNIA;  Surgeon: Adin Hector, MD;  Location: [facility];  Service: General;  Laterality: N/A;  Laparoscopic Ventral Wall Hernia Repair with Mesh    FAMILY HISTORY Family History  Problem Relation Age of Onset  . Malignant hyperthermia Mother   . Heart attack Father   . Stroke Father   . Diabetes      Grandmother  . Other      respiratory- Grandmother  . Cancer Maternal Grandmother 37    breast   patient's father died at the age of 45 from a myocardial infarction. The patient's mother, [name], is alive at 13. She lives with the patient, and is very independent (drives, etc.) The patient has 2 brothers, no sisters. The patient's mother is mother was diagnosed with breast cancer at the age of 6. There is no history of breast or ovarian cancer in the family (there is a history of cervical cancer in the patient's daughter).  GYNECOLOGIC HISTORY:  Menarche age 7, first live birth age 59, the patient is [place] P2. She stopped having periods approximately 1999 and took hormone replacements until 2005.  SOCIAL HISTORY:  Starlene works as a Estate agent, currently in [place]. She is divorced and lives with her mother [name]. Daughter [name] is a business woman living in [place]. Son [name] lives in [place], also in business. The patient has 3 grandchildren. She attends  [employer]    ADVANCED DIRECTIVES: Not in place   HEALTH MAINTENANCE: Social History  Substance Use Topics  . Smoking status: Never Smoker  . Smokeless tobacco: Never Used  . Alcohol use No     Colonoscopy: Never  PAP:  Bone density: Never  Lipid panel:  Allergies  Allergen Reactions  . Aleve [Naproxen Sodium] Hives, Itching and Swelling  . Aromasin [Exemestane] Anaphylaxis, Itching and Swelling    Pt currently tolerating medication well (02/03/16)-gwd; Pt stated "I can take this medication for a few months at a time and I take a benadryl when I feel it starting to come on" Patient has facial swelling, itching, and throat swelling  . Letrozole Shortness Of Breath, Rash and Other (See Comments)    Swelling - feet, eyes,hands;   Speech garbled  . Levofloxacin Rash and Swelling    Facial swelling and red facial rash  . Penicillins Hives, Swelling and Rash    All  over the body. Has patient had a PCN reaction causing immediate rash, facial/tongue/throat swelling, SOB or lightheadedness with hypotension: Yes Has patient had a PCN reaction causing severe rash involving mucus membranes or skin necrosis: No Has patient had a PCN reaction that required hospitalization No Has patient had a PCN reaction occurring within the last 10 years: No If all of the above answers are "NO", then may proceed with Cephalosporin use.   . Codeine Other (See Comments)    hallucinations  . Other Swelling    Swelling of  Eyes   Wheat ,oranges banana, zucchini,cayenne,chili  Peppers, sweet potatoes, pumpkin  . Percocet [Oxycodone-Acetaminophen] Other (See Comments)    Causes syncope day after medication has been taken.  . Vicodin [Hydrocodone-Acetaminophen] Other (See Comments)    Causes syncope day after medication has been taken.  . Prednisone Other (See Comments)    Unknown    Current Outpatient Prescriptions  Medication Sig Dispense Refill  . ALPRAZolam (XANAX) 0.5 MG tablet Take 0.5  mg by mouth at bedtime as needed for anxiety or sleep.     . Coenzyme Q10 (CO Q 10) 100 MG CAPS Take 200 mg by mouth daily.    . divalproex (DEPAKOTE) 500 MG DR tablet Take 1 tablet (500 mg total) by mouth daily. 90 tablet 3  . exemestane (AROMASIN) 25 MG tablet Take 1 tablet (25 mg total) by mouth daily after breakfast. 90 tablet 4  . exemestane (AROMASIN) 25 MG tablet TAKE 1 TABLET(25 MG) BY MOUTH DAILY AFTER BREAKFAST 90 tablet 0  . fluticasone (FLONASE) 50 MCG/ACT nasal spray Reported on 02/03/2016    . glucose blood (ONETOUCH VERIO) test strip     . hydrochlorothiazide (HYDRODIURIL) 25 MG tablet Take 25 mg by mouth at bedtime.     Marland Kitchen linagliptin (TRADJENTA) 5 MG TABS tablet Take 5 mg by mouth daily.    Marland Kitchen lisinopril (PRINIVIL,ZESTRIL) 5 MG tablet Take 5 mg by mouth daily.    Marland Kitchen NATURE-THROID 97.5 MG TABS Take 48.75-97.5 mg by mouth 2 (two) times daily. 97.40m in the morning, 48.728min the evening    . ONETOUCH DELICA LANCETS FINE MISC     . rosuvastatin (CRESTOR) 5 MG tablet Take 5 mg by mouth daily.    . traMADol (ULTRAM) 50 MG tablet Take 1 tablet (50 mg total) by mouth daily as needed. 30 tablet 0  . traZODone (DESYREL) 100 MG tablet Take 100 mg by mouth.     No current facility-administered medications for this visit.     OBJECTIVE: Middle-aged white woman Who appears well Vitals:   07/27/16 1510  BP: 137/68  Pulse: 72  Resp: 20  Temp: 98 F (36.7 C)     Body mass index is 40.03 kg/m.    ECOG FS:0 - Asymptomatic  Sclerae unicteric, pupils round and equal Oropharynx clear and moist-- no thrush or other lesions No cervical or supraclavicular adenopathy Lungs no rales or rhonchi Heart regular rate and rhythm Abd soft, obese, nontender, positive bowel sounds MSK no focal spinal tenderness, no upper extremity lymphedema Neuro: nonfocal, well oriented, appropriate affect Breasts: The right breast is benign. The left breast is status post lumpectomy followed by radiation, with no  evidence of local recurrence. The left axilla is benign.  LAB RESULTS:  CMP     Component Value Date/Time   NA 137 12/23/2015 0500   NA 142 11/17/2015 1548   K 4.1 12/23/2015 0500   K 3.4 (L) 11/17/2015 1548   CL  102 12/23/2015 0500   CO2 27 12/23/2015 0500   CO2 31 (H) 11/17/2015 1548   GLUCOSE 118 (H) 12/23/2015 0500   GLUCOSE 91 11/17/2015 1548   BUN 8 12/23/2015 0500   BUN 15.0 11/17/2015 1548   CREATININE 0.86 12/23/2015 0500   CREATININE 0.8 11/17/2015 1548   CALCIUM 8.2 (L) 12/23/2015 0500   CALCIUM 9.9 11/17/2015 1548   PROT 7.1 12/10/2015 1556   PROT 7.4 11/17/2015 1548   ALBUMIN 3.9 12/10/2015 1556   ALBUMIN 4.0 11/17/2015 1548   AST 39 12/10/2015 1556   AST 34 11/17/2015 1548   ALT 50 12/10/2015 1556   ALT 50 11/17/2015 1548   ALKPHOS 57 12/10/2015 1556   ALKPHOS 58 11/17/2015 1548   BILITOT 1.0 12/10/2015 1556   BILITOT 0.98 11/17/2015 1548   GFRNONAA >60 12/23/2015 0500   GFRAA >60 12/23/2015 0500    I No results found for: SPEP  Lab Results  Component Value Date   WBC 5.3 07/27/2016   NEUTROABS 2.7 07/27/2016   HGB 13.9 07/27/2016   HCT 41.9 07/27/2016   MCV 86.0 07/27/2016   PLT 238 07/27/2016      Chemistry      Component Value Date/Time   NA 137 12/23/2015 0500   NA 142 11/17/2015 1548   K 4.1 12/23/2015 0500   K 3.4 (L) 11/17/2015 1548   CL 102 12/23/2015 0500   CO2 27 12/23/2015 0500   CO2 31 (H) 11/17/2015 1548   BUN 8 12/23/2015 0500   BUN 15.0 11/17/2015 1548   CREATININE 0.86 12/23/2015 0500   CREATININE 0.8 11/17/2015 1548      Component Value Date/Time   CALCIUM 8.2 (L) 12/23/2015 0500   CALCIUM 9.9 11/17/2015 1548   ALKPHOS 57 12/10/2015 1556   ALKPHOS 58 11/17/2015 1548   AST 39 12/10/2015 1556   AST 34 11/17/2015 1548   ALT 50 12/10/2015 1556   ALT 50 11/17/2015 1548   BILITOT 1.0 12/10/2015 1556   BILITOT 0.98 11/17/2015 1548       No results found for: LABCA2  No components found for: LABCA125  No  results for input(s): INR in the last 168 hours.  Urinalysis    Component Value Date/Time   COLORURINE YELLOW 12/10/2015 1556   APPEARANCEUR CLEAR 12/10/2015 1556   LABSPEC 1.024 12/10/2015 1556   PHURINE 5.5 12/10/2015 1556   GLUCOSEU NEGATIVE 12/10/2015 1556   HGBUR NEGATIVE 12/10/2015 1556   BILIRUBINUR NEGATIVE 12/10/2015 1556   KETONESUR NEGATIVE 12/10/2015 1556   PROTEINUR NEGATIVE 12/10/2015 1556   UROBILINOGEN 0.2 10/07/2011 0501   NITRITE NEGATIVE 12/10/2015 1556   [place] 12/10/2015 1556    STUDIES:Mm Diag Breast Tomo Bilateral  Result Date: 07/19/2016 CLINICAL DATA:  History of malignant lumpectomy of the left breast in 2015.Annual re-evaluation. EXAM: 2D DIGITAL DIAGNOSTIC BILATERAL MAMMOGRAM WITH CAD AND ADJUNCT TOMO COMPARISON:  Previous exam(s). ACR Breast Density Category b: There are scattered areas of fibroglandular density. FINDINGS: There are stable scarring changes located within the superior left breast related to the patient's lumpectomy. The breast parenchymal pattern is stable bilaterally. There are no findings worrisome for recurrent tumor or developing malignancy within either breast. Mammographic images were processed with CAD. IMPRESSION: No findings worrisome for recurrent tumor or developing malignancy. RECOMMENDATION: Bilateral diagnostic mammography in 1 year. I have discussed the findings and recommendations with the patient. Results were also provided in writing at the conclusion of the visit. If applicable, a reminder letter will be sent  to the patient regarding the next appointment. BI-RADS CATEGORY  1: Negative. Electronically Signed   By: Altamese Cabal M.D.   On: 07/19/2016 11:07    ASSESSMENT: 66 y.o. [place] woman status post left breast biopsy 07/21/2013 for a clinical T1 N0, stage IA invasive ductal carcinoma, grade 1, estrogen and progesterone receptor strongly positive, HER-2 not amplified, with an MIB-1 of 5%  (1) History  of pulmonary embolism documented by CT angiography 10/07/2011 (9 days after ventral hernia repair); Dopplers 11-2011 showed no lower extremity DVTs; the patient received Coumadin for 10 months. Hypercoagulable  Workup performed in January 2015 showed negative LAC, anti-cardiolipin Abs, negative to borderline beta-2-glycoprotein Abs, AT, protein C and S, prothrombin II gene mutation and Factor V Leiden  (2) status post left lumpectomy and sentinel lymph node sampling 08/04/2013 for a pT1c pN0, stage IA invasive ductal carcinoma, grade 1, with repeat HER-2 again negative.  (3) Oncotype score of 14 predicts a risk of outside the breast recurrence within 10 years of 9% if the patient's only systemic therapy is tamoxifen for 5 years. Also predicts no benefit from chemotherapy.  (4) Adjuvant Radiation therapy started on 09/09/2013 completed on 10/13/2013  (5) Adjuvant Aromatase  inhibitor therapy with Letrozole start date-11/19/2013  (a) Letrozole was discontinued 12/03/2013 secondary to allergic reaction (rash)  (b) anastrozole start date 12/05/2013, discontinued 12/01/2014 due to side effects  (c) exemestane started July 2016-- to be continued through June 2020  (6) osteopenia with a T score of -2.2 on bone density 10/29/2013  (a) zolendronate started 11/05/2014  PLAN: Aishah is now 3 years out from definitive surgery for her breast cancer with no evidence of disease recurrence. This is very favorable area  She is tolerating the exemestane well. Given that she had a years worth of anti-estrogens before starting exemestane, we'll continue on this medication through June 2020.  I congratulated her on her exercise program and encouraged her to continue it.   She will need a bone density this year and those orders have been entered. If there continues to be significant osteopenia we will proceed to his alendronate in April as originally planned  Otherwise she will see me again in one year. She knows  to call for any problems that may develop before that visit.   Chauncey Cruel, MD   07/27/2016 3:30 PM

## 2016-08-31 ENCOUNTER — Ambulatory Visit
Admission: RE | Admit: 2016-08-31 | Discharge: 2016-08-31 | Disposition: A | Discharge status: Home / Self Care | Payer: BC Managed Care – PPO | Source: Ambulatory Visit | Attending: Oncology | Admitting: Oncology

## 2016-08-31 DIAGNOSIS — C50212 Malignant neoplasm of upper-inner quadrant of left female breast: Secondary | ICD-10-CM

## 2016-08-31 DIAGNOSIS — M8580 Other specified disorders of bone density and structure, unspecified site: Secondary | ICD-10-CM

## 2016-08-31 DIAGNOSIS — Z170 Estrogen receptor positive status [ER+]: Secondary | ICD-10-CM

## 2016-12-07 ENCOUNTER — Ambulatory Visit (INDEPENDENT_AMBULATORY_CARE_PROVIDER_SITE_OTHER): Payer: 59 | Admitting: Orthopaedic Surgery

## 2016-12-07 ENCOUNTER — Ambulatory Visit (INDEPENDENT_AMBULATORY_CARE_PROVIDER_SITE_OTHER): Payer: Self-pay

## 2016-12-07 ENCOUNTER — Encounter (INDEPENDENT_AMBULATORY_CARE_PROVIDER_SITE_OTHER): Payer: Self-pay | Admitting: Orthopaedic Surgery

## 2016-12-07 VITALS — BP 110/68 | HR 74 | Ht 62.0 in | Wt 219.0 lb

## 2016-12-07 DIAGNOSIS — M7651 Patellar tendinitis, right knee: Secondary | ICD-10-CM | POA: Diagnosis not present

## 2016-12-07 DIAGNOSIS — M25561 Pain in right knee: Secondary | ICD-10-CM

## 2016-12-07 NOTE — Progress Notes (Signed)
Office Visit Note   Patient: Alyssa [name]           Date of Birth: [birth year]           MRN: [id number] Visit Date: 12/07/2016              Requested by: Stephens Shire, MD [address] [place], [place] [address] PCP: Stephens Shire, MD   Assessment & Plan: Visit Diagnoses:  1. Acute pain of right knee   2. Patellar tendinitis of right knee     Plan: Patient has a knee immobilizer she can use to help unload her patellar tendon was set up for some therapy possibly some ionophoresis over the patellar tendon. Patella was tracking well she has normal postop x-rays of her knee. I'll recheck her in 6 weeks.  Follow-Up Instructions: Return in about 6 weeks (around 01/18/2017).   Orders:  Orders Placed This Encounter  Procedures  . XR KNEE 3 VIEW RIGHT   No orders of the defined types were placed in this encounter.     Procedures: No procedures performed   Clinical Data: No additional findings.   Subjective: Chief Complaint  Patient presents with  . Right Knee - Pain    HPI issue returns post right total knee arthroplasty now 1 year out. She had been doing well noticed something on her couch for many weeks and finally decided to get down underneath the try to reach underneath the couch. When she got up she had increased pain in her knee and is been limping now for over a month. She was concerned that she damaged her knee. She's not noticed any fever or chills no swelling. She has pain with extension pain getting from sitting standing she's better with supine position and rest. No associated joint symptoms.  Review of Systems 14 for new systems updated from last year is unchanged other than history of present illness.   Objective: Vital Signs: BP 110/68   Pulse 74   Ht 5' 2"  (1.575 m)   Wt 219 lb (99.3 kg)   BMI 40.06 kg/m   Physical Exam  Constitutional: She is oriented to person, place, and time. She appears well-developed.  HENT:  Head:  Normocephalic.  Right Ear: External ear normal.  Left Ear: External ear normal.  Eyes: Pupils are equal, round, and reactive to light.  Neck: No tracheal deviation present. No thyromegaly present.  Cardiovascular: Normal rate.   Pulmonary/Chest: Effort normal.  Abdominal: Soft.  Musculoskeletal:  Normal hip range of motion. She can reach full extension to straight leg raise but does have some pain points the proximal portion patellar tendon. No palpable defects are present in the tendon. Normal patellar tracking collateral crucial ligament exam is normal no significant knee effusion is noted no prepatellar bursitis. Proximal portion of the toe was tender in both extension and flexion. Pain with resisted extension. Distal pulses are normal normal ankle range of motion negative up to compression tests no sciatic notch tenderness.  Neurological: She is alert and oriented to person, place, and time.  Skin: Skin is warm and dry.  Psychiatric: She has a normal mood and affect. Her behavior is normal.    Ortho Exam  Specialty Comments:  No specialty comments available.  Imaging: Xr Knee 3 View Right  Result Date: 12/07/2016 Three-view x-ray showed good position alignment of total knee arthroplasty on the right knee. No evidence of fracture and no prosthetic loosening. Impression: Satisfactory postop  right total knee arthroplasty    PMFS History: Patient Active Problem List   Diagnosis Date Noted  . Status post total right knee replacement 12/22/2015  . Hepatic steatosis 02/09/2015  . Hot flashes 09/09/2014  . Osteopenia 09/09/2014  . Breast cancer of upper-inner quadrant of left female breast ([place]) 07/23/2013  . Memory loss 07/14/2013  . Headache 07/14/2013  . Other malaise and fatigue 07/14/2013  . Acute pulmonary embolism ([place]) 10/07/2011  . Leukocytosis 10/07/2011  . Thyroid mass, left inferior lobe, 3.6cm [id number] 10/07/2011  . Hyponatremia 10/07/2011  . Obesity (BMI 30-39.9)  08/07/2011  . Post concussion syndrome    Past Medical History:  Diagnosis Date  . Abdominal pain   . Breast cancer ([place]) 06/2013   left upper inner  . Cancer ([facility])    breast  . Diabetes mellitus without complication ([place])    Takes Tradjenta  . Family history of adverse reaction to anesthesia    Patients mother had to be resusciated after having anesthesia  . GERD (gastroesophageal reflux disease)   . Headache(784.0)    Takes Depakote  . Hernia, incisional periumbilical, incarcerated 1/77/9390  . Hot flashes   . Hx of radiation therapy 09/09/13- 10/13/13   left breast 5000 cGy in 25 sessions, no boost  . Hyperlipidemia   . Hypertension    PCP DR Nolon Rod  . Hypothyroidism   . Peripheral vascular disease ([place])    pulmonary embolis-still have some  . Post concussion syndrome    4 yrs ago- sees [facility]- Dr Leonie Man every 6 months-   has sleep study 2011 following accident but was neg per patient-  short term memory issues, dates and times  . Pulmonary embolism ([place]) 2013  . Recurrent upper respiratory infection (URI)    FLU 08/23/11-08/29/11 then URI 08/29/11- 09/08/11- S/P zpack and steroids-  improved      no cough or congestion  . Thyroid disease   . Thyroid mass, left inferior lobe, 3.6cm [id number] 10/07/2011  . Ventral hernia     Family History  Problem Relation Age of Onset  . Malignant hyperthermia Mother   . Heart attack Father   . Stroke Father   . Diabetes Unknown        Grandmother  . Other Unknown        respiratory- Grandmother  . Cancer Maternal Grandmother 52       breast    Past Surgical History:  Procedure Laterality Date  . APPENDECTOMY  2002   lap appy.  Dr. Hassell Done  . BREAST LUMPECTOMY WITH NEEDLE LOCALIZATION AND AXILLARY SENTINEL LYMPH NODE BX Left 08/04/2013   Procedure: BREAST LUMPECTOMY WITH NEEDLE LOCALIZATION AND AXILLARY SENTINEL LYMPH NODE Biopsy x 3;  Surgeon: Rolm Bookbinder, MD;  Location: [facility];  Service: General;  Laterality: Left;  .  BREAST SURGERY    . CARPAL TUNNEL RELEASE  10/2009-12/2010   left - right  . CESAREAN SECTION  X 2  . COLONOSCOPY    . HERNIA REPAIR  09/15/11   Ventral w/mesh  . JOINT REPLACEMENT    . KNEE ARTHROPLASTY Right 12/22/2015   Procedure: COMPUTER ASSISTED TOTAL KNEE ARTHROPLASTY;  Surgeon: Marybelle Killings, MD;  Location: [facility];  Service: Orthopedics;  Laterality: Right;  . LAPAROSCOPIC CHOLECYSTECTOMY  2006   Dr. Bubba Camp  . TARSAL TUNNEL RELEASE Bilateral   . THYROIDECTOMY, PARTIAL    . VENTRAL HERNIA REPAIR  09/29/2011   Procedure: LAPAROSCOPIC VENTRAL HERNIA;  Surgeon: Adin Hector, MD;  Location: [facility];  Service: General;  Laterality: N/A;  Laparoscopic Ventral Wall Hernia Repair with Mesh   Social History   Occupational History  .  [place] History Main Topics  . Smoking status: Never Smoker  . Smokeless tobacco: Never Used  . Alcohol use No  . Drug use: No  . Sexual activity: Yes     Comment: Menarche age 42, first live birth age 63,  [place] P2. She stopped having periods approximately 1999 and took HRT until 2005.

## 2016-12-07 NOTE — Addendum Note (Signed)
Addended by: Meyer Cory on: 12/07/2016 04:21 PM   Modules accepted: Orders

## 2016-12-21 ENCOUNTER — Ambulatory Visit: Payer: Medicare Other | Attending: Orthopaedic Surgery | Admitting: Physical Therapy

## 2016-12-21 ENCOUNTER — Encounter: Payer: Self-pay | Admitting: Physical Therapy

## 2016-12-21 DIAGNOSIS — R600 Localized edema: Secondary | ICD-10-CM

## 2016-12-21 DIAGNOSIS — M25561 Pain in right knee: Secondary | ICD-10-CM | POA: Diagnosis present

## 2016-12-21 DIAGNOSIS — M6281 Muscle weakness (generalized): Secondary | ICD-10-CM

## 2016-12-21 NOTE — Therapy (Addendum)
[facility] [address] [place], [place], [address] Phone: [phone]   Fax:  [phone]  Physical Therapy Evaluation  Patient Details  Name: Alyssa [name] MRN: [id number] Date of Birth: 04/02/1951 Referring Provider: Dr. Rodell Perna  Encounter Date: 12/21/2016      PT End of Session - 12/21/16 1636    Visit Number 1   Date for PT Re-Evaluation 02/15/17   PT Start Time 6659   PT Stop Time 1630   PT Time Calculation (min) 45 min   Activity Tolerance Patient tolerated treatment well   Behavior During Therapy [facility] for tasks assessed/performed      Past Medical History:  Diagnosis Date  . Abdominal pain   . Breast cancer ([place]) 06/2013   left upper inner  . Cancer ([place])    breast  . Diabetes mellitus without complication ([place])    Takes Tradjenta  . Family history of adverse reaction to anesthesia    Patients mother had to be resusciated after having anesthesia  . GERD (gastroesophageal reflux disease)   . Headache(784.0)    Takes Depakote  . Hernia, incisional periumbilical, incarcerated 9/35/7017  . Hot flashes   . Hx of radiation therapy 09/09/13- 10/13/13   left breast 5000 cGy in 25 sessions, no boost  . Hyperlipidemia   . Hypertension    PCP DR Nolon Rod  . Hypothyroidism   . Peripheral vascular disease ([place])    pulmonary embolis-still have some  . Post concussion syndrome    4 yrs ago- sees [facility]- Dr Leonie Man every 6 months-   has sleep study 2011 following accident but was neg per patient-  short term memory issues, dates and times  . Pulmonary embolism ([place]) 2013  . Recurrent upper respiratory infection (URI)    FLU 08/23/11-08/29/11 then URI 08/29/11- 09/08/11- S/P zpack and steroids-  improved      no cough or congestion  . Thyroid disease   . 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 Ventral hernia     Past Surgical History:  Procedure Laterality Date  . APPENDECTOMY   2002   lap appy.  Dr. Hassell Done  . BREAST LUMPECTOMY WITH NEEDLE LOCALIZATION AND AXILLARY SENTINEL LYMPH NODE BX Left 08/04/2013   Procedure: BREAST LUMPECTOMY WITH NEEDLE LOCALIZATION AND AXILLARY SENTINEL LYMPH NODE Biopsy x 3;  Surgeon: Rolm Bookbinder, MD;  Location: [place];  Service: General;  Laterality: Left;  . BREAST SURGERY    . CARPAL TUNNEL RELEASE  10/2009-12/2010   left - right  . CESAREAN SECTION  X 2  . COLONOSCOPY    . HERNIA REPAIR  09/15/11   Ventral w/mesh  . JOINT REPLACEMENT    . KNEE ARTHROPLASTY Right 12/22/2015   Procedure: COMPUTER ASSISTED TOTAL KNEE ARTHROPLASTY;  Surgeon: Marybelle Killings, MD;  Location: [place];  Service: Orthopedics;  Laterality: Right;  . LAPAROSCOPIC CHOLECYSTECTOMY  2006   Dr. Bubba Camp  . TARSAL TUNNEL RELEASE Bilateral   . THYROIDECTOMY, PARTIAL    . VENTRAL HERNIA REPAIR  09/29/2011   Procedure: LAPAROSCOPIC VENTRAL HERNIA;  Surgeon: Adin Hector, MD;  [facility];  Service: General;  Laterality: N/A;  Laparoscopic Ventral Wall Hernia Repair with Mesh    There were no vitals filed for this visit.       Subjective Assessment - 12/21/16 1550    Subjective No pain due to wearing knee immobilizer. Patient on was kneeling on floor to look under couch  on 12/08/2016 when she hurt her knee. 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  I was not able to do alternate step with leg. Patient has to pull herself with her arms because of her knee hurting.    Patient Stated Goals back to prior function 98 degrees knee flexion   Currently in Pain? No/denies            [facility] PT Assessment - 12/21/16 0001      Assessment   Medical Diagnosis M76.51 Patellar tendinitis of right knee   Referring Provider Dr. Rodell Perna   Onset Date/Surgical Date 12/08/16   Prior Therapy Yes when had TKR     Precautions   Precautions Other (comment)   Precaution Comments cancer     Restrictions   Weight Bearing Restrictions No   Other  Position/Activity Restrictions wear knee immobilizer at school     Balance Screen   Has the patient fallen in the past 6 months No   Has the patient had a decrease in activity level because of a fear of falling?  No   Is the patient reluctant to leave their home because of a fear of falling?  No     Home [place] residence   Living Arrangements Other relatives     Prior Function   Level of Independence Independent   Vocation Full time employment   Vocation Requirements stairs, walking, standing   Leisure walking, elliptical 10 min     Cognition   Overall Cognitive Status Within Functional Limits for tasks assessed     Observation/Other Assessments   Focus on Therapeutic Outcomes (FOTO)  59% limitation  42% limitation goal     Observation/Other Assessments-Edema    Edema Circumferential     Circumferential Edema   Circumferential - Right 53.5 cm   Circumferential - Left  51 cm     Posture/Postural Control   Posture/Postural Control No significant limitations     ROM / Strength   AROM / PROM / Strength AROM;PROM;Strength     AROM   Right/Left Knee Right   Right Knee Extension 0   Right Knee Flexion 105     PROM   PROM Assessment Site Knee   Right/Left Knee Right   Right Knee Extension 0   Right Knee Flexion 115     Strength   Strength Assessment Site Knee   Right/Left Knee Right   Right Knee Flexion 4/5   Right Knee Extension 3+/5     Palpation   Palpation comment tenderness located on right patella tendon and lateral of right patella     iontophoresis with 1 ml of dexamethasone to lateral right patella for 6 hours for pain.  Earlie Counts, PT 01/03/17 10:32 AM          Objective measurements completed on examination: See above findings.                  PT Education - 12/21/16 1635    Education provided Yes   Education Details information on iontophoresis   Person(s) Educated Patient   Methods  Explanation;Handout   Comprehension Verbalized understanding          PT Short Term Goals - 12/21/16 1646      PT SHORT TERM GOAL #1   Title be independent in initial HEP   Time 4   Period Weeks   Status New     PT SHORT TERM  GOAL #2   Title ability to ambulate for 20 minutes with no pain or knee immobilizer due to increased endurance   Time 4   Period Weeks   Status New     PT SHORT TERM GOAL #3   Title --     PT SHORT TERM GOAL #4   Title --           PT Long Term Goals - 12/21/16 1618      PT LONG TERM GOAL #1   Title be independent in advanced HEP   Time 8   Period Weeks   Status New     PT LONG TERM GOAL #2   Title reduce FOTO to < or = to 42% limitation   Time 8   Period Weeks   Status New     PT LONG TERM GOAL #3   Title ascend steps with step-over-step with use of 1 rail   Time 8   Period Weeks   Status New     PT LONG TERM GOAL #4   Title be able to get up and down from the floor to play with her grandchild due to right knee strength >/= 4+/5   Time 8   Period Weeks   Status New     PT LONG TERM GOAL #5   Title imrprove strength and endurance to walk for 30 minutes in the community without need for rest   Time 8   Period Weeks   Status New                Plan - 12/21/16 1637    Clinical Impression Statement Patient is a 66 year old female with right patellar tendonitis that began on 12/08/2016 when she was kneeling on the floor to look under the couch. Patient reports no pain at this time since she has been wearing the knee immobilizer at work to unload the patella tendon.  Palpable tenderness located on the right patella tendon and lateral aspect of right patella tendon.  After therapist performed right patella mobilization and transverse friction massage there was no tenderness. Right knee strength is 3+/5 for extension and 4/5 for flexion.  Right knee AROM is 0-105 and PROM is 0-115.  Patient has swelling on right knee.  Patient is  unable to get up and down from the floor and alternate steps like she was doing prior to injury.  Patient is not walking 30 minutes that she needs to do as per her oncologist. Patient ambulates with a knee immobilizer at work.   Patient would benefit from skilled therapy to improve right knee strength and wean her from the kee immobilizer.    History and Personal Factors relevant to plan of care: right total knee replacement 2017   Clinical Presentation Stable   Clinical Presentation due to: stable   Clinical Decision Making Low   Rehab Potential Excellent   Clinical Impairments Affecting Rehab Potential Right TKR 2017   PT Frequency 2x / week   PT Duration 8 weeks   PT Treatment/Interventions Iontophoresis 79m/ml Dexamethasone;Electrical Stimulation;Cryotherapy;Moist Heat;Gait training;Stair training;Patient/family education;Neuromuscular re-education;Therapeutic exercise;Therapeutic activities;Manual techniques;Passive range of motion;Taping;Vasopneumatic Device   PT Next Visit Plan see if ionto helped; right knee strengthening for HEP, Nustep; work on getting up and down from floor without pressure on right knee; soft tissue work to right knee   PT Home Exercise Plan strengthening   Recommended Other Services None   Consulted and Agree with Plan of Care Patient  Patient will benefit from skilled therapeutic intervention in order to improve the following deficits and impairments:  Abnormal gait, Difficulty walking, Pain, Decreased strength, Decreased activity tolerance, Decreased endurance, Increased edema  Visit Diagnosis: Acute pain of right knee - Plan: PT plan of care cert/re-cert  Muscle weakness (generalized) - Plan: PT plan of care cert/re-cert  Localized edema - Plan: PT plan of care cert/re-cert      G-Codes - 30/86/57 1634    Functional Assessment Tool Used (Outpatient Only) FOTO score 59% limitation  goal is 42% limitation   Functional Limitation Mobility: Walking and  moving around   Mobility: Walking and Moving Around Current Status [phone]) At least 40 percent but less than 60 percent impaired, limited or restricted   Mobility: Walking and Moving Around Goal Status [phone]) At least 40 percent but less than 60 percent impaired, limited or restricted       Problem List Patient Active Problem List   Diagnosis Date Noted  . Status post total right knee replacement 12/22/2015  . Hepatic steatosis 02/09/2015  . Hot flashes 09/09/2014  . Osteopenia 09/09/2014  . Breast cancer of upper-inner quadrant of left female breast ([place]) 07/23/2013  . Memory loss 07/14/2013  . Headache 07/14/2013  . Other malaise and fatigue 07/14/2013  . Acute pulmonary embolism ([place]) 10/07/2011  . Leukocytosis 10/07/2011  . Thyroid mass, left inferior lobe, 3.6cm [id number] 10/07/2011  . Hyponatremia 10/07/2011  . Obesity (BMI 30-39.9) 08/07/2011  . Post concussion syndrome     Earlie Counts, PT 12/21/16 4:50 PM   [facility] [address], [place], [place], [address] Phone: [phone]   Fax:  [phone]  Name: Alyssa [name] MRN: [id number] Date of Birth: [birth year]

## 2016-12-21 NOTE — Patient Instructions (Signed)

## 2017-01-03 ENCOUNTER — Ambulatory Visit: Payer: Medicare Other | Attending: Orthopaedic Surgery | Admitting: Physical Therapy

## 2017-01-03 ENCOUNTER — Encounter: Payer: Self-pay | Admitting: Physical Therapy

## 2017-01-03 DIAGNOSIS — M25661 Stiffness of right knee, not elsewhere classified: Secondary | ICD-10-CM

## 2017-01-03 DIAGNOSIS — R600 Localized edema: Secondary | ICD-10-CM | POA: Diagnosis present

## 2017-01-03 DIAGNOSIS — M25561 Pain in right knee: Secondary | ICD-10-CM

## 2017-01-03 DIAGNOSIS — M6281 Muscle weakness (generalized): Secondary | ICD-10-CM | POA: Diagnosis present

## 2017-01-03 NOTE — Therapy (Signed)
[facility] [address], [place], [place], [address] Phone: [phone]   Fax:  [phone]  Physical Therapy Treatment  Patient Details  Name: Alyssa [name] MRN: [id number] Date of Birth: 12-Oct-1950 Referring Provider: Dr. Rodell Perna  Encounter Date: 01/03/2017      PT End of Session - 01/03/17 1000    Visit Number 2   Date for PT Re-Evaluation 02/15/17   PT Start Time 0930   PT Stop Time 1015   PT Time Calculation (min) 45 min   Activity Tolerance Patient tolerated treatment well   Behavior During Therapy [facility] for tasks assessed/performed      Past Medical History:  Diagnosis Date  . Abdominal pain   . Breast cancer ([place]) 06/2013   left upper inner  . Cancer ([place])    breast  . Diabetes mellitus without complication ([place])    Takes Tradjenta  . Family history of adverse reaction to anesthesia    Patients mother had to be resusciated after having anesthesia  . GERD (gastroesophageal reflux disease)   . Headache(784.0)    Takes Depakote  . Hernia, incisional periumbilical, incarcerated 8/41/6606  . Hot flashes   . Hx of radiation therapy 09/09/13- 10/13/13   left breast 5000 cGy in 25 sessions, no boost  . Hyperlipidemia   . Hypertension    PCP DR Nolon Rod  . Hypothyroidism   . Peripheral vascular disease ([place])    pulmonary embolis-still have some  . Post concussion syndrome    4 yrs ago- sees [facility]- Dr Leonie Man every 6 months-   has sleep study 2011 following accident but was neg per patient-  short term memory issues, dates and times  . Pulmonary embolism ([place]) 2013  . Recurrent upper respiratory infection (URI)    FLU 08/23/11-08/29/11 then URI 08/29/11- 09/08/11- S/P zpack and steroids-  improved      no cough or congestion  . Thyroid disease   . Thyroid mass, left inferior lobe, 3.6cm [id number] 10/07/2011  . Ventral hernia     Past Surgical History:  Procedure Laterality Date  . APPENDECTOMY   2002   lap appy.  Dr. Hassell Done  . BREAST LUMPECTOMY WITH NEEDLE LOCALIZATION AND AXILLARY SENTINEL LYMPH NODE BX Left 08/04/2013   Procedure: BREAST LUMPECTOMY WITH NEEDLE LOCALIZATION AND AXILLARY SENTINEL LYMPH NODE Biopsy x 3;  Surgeon: Rolm Bookbinder, MD;  Location: [place];  Service: General;  Laterality: Left;  . BREAST SURGERY    . CARPAL TUNNEL RELEASE  10/2009-12/2010   left - right  . CESAREAN SECTION  X 2  . COLONOSCOPY    . HERNIA REPAIR  09/15/11   Ventral w/mesh  . JOINT REPLACEMENT    . KNEE ARTHROPLASTY Right 12/22/2015   Procedure: COMPUTER ASSISTED TOTAL KNEE ARTHROPLASTY;  Surgeon: Marybelle Killings, MD;  Location: [place];  Service: Orthopedics;  Laterality: Right;  . LAPAROSCOPIC CHOLECYSTECTOMY  2006   Dr. Bubba Camp  . TARSAL TUNNEL RELEASE Bilateral   . THYROIDECTOMY, PARTIAL    . VENTRAL HERNIA REPAIR  09/29/2011   Procedure: LAPAROSCOPIC VENTRAL HERNIA;  Surgeon: Adin Hector, MD;  [facility];  Service: General;  Laterality: N/A;  Laparoscopic Ventral Wall Hernia Repair with Mesh    There were no vitals filed for this visit.      Subjective Assessment - 01/03/17 0935    Subjective I have been doing alot of driving.  I have some swelling.  I have not much pain.  Ionto patch has helped.    Patient Stated Goals back to prior function 98 degrees knee flexion   Currently in Pain? Yes   Pain Score 3    Pain Location Knee   Pain Orientation Right   Pain Descriptors / Indicators Discomfort   Pain Type Acute pain   Pain Onset More than a month ago   Pain Frequency Intermittent   Aggravating Factors  stand up and sit; stairs,    Pain Relieving Factors rest   Multiple Pain Sites No                         [facility] Adult PT Treatment/Exercise - 01/03/17 0001      Exercises   Exercises Knee/Hip     Knee/Hip Exercises: Machines for Strengthening   Total Gym Leg Press seat # 5 bil. 70# 10x3; right leg 30# 2x15     Knee/Hip Exercises: Standing   Forward  Step Up 2 sets;Left;Hand Hold: 1;Step Height: 4"   Step Down 1 set;20 reps;Hand Hold: 1;Step Height: 2"     Knee/Hip Exercises: Seated   Long Arc Quad Strengthening;Right;3 sets;10 reps;Weights   Long Arc Quad Weight 4 lbs.   Hamstring Curl Strengthening;Right;3 sets;10 reps   Hamstring Limitations green band     Modalities   Modalities Iontophoresis     Iontophoresis   Type of Iontophoresis Dexamethasone   Location lat. right patella   Dose 25m   Time 6 hour patch     Manual Therapy   Manual Therapy Soft tissue mobilization;Joint mobilization   Joint Mobilization right patella mobilization   Soft tissue mobilization lateral right patella and patella tendon                  PT Short Term Goals - 01/03/17 1028      PT SHORT TERM GOAL #1   Title be independent in initial HEP   Time 4   Period Weeks   Status On-going     PT SHORT TERM GOAL #2   Title ability to ambulate for 20 minutes with no pain or knee immobilizer due to increased endurance   Time 4   Period Weeks   Status On-going           PT Long Term Goals - 12/21/16 1618      PT LONG TERM GOAL #1   Title be independent in advanced HEP   Time 8   Period Weeks   Status New     PT LONG TERM GOAL #2   Title reduce FOTO to < or = to 42% limitation   Time 8   Period Weeks   Status New     PT LONG TERM GOAL #3   Title ascend steps with step-over-step with use of 1 rail   Time 8   Period Weeks   Status New     PT LONG TERM GOAL #4   Title be able to get up and down from the floor to play with her grandchild due to right knee strength >/= 4+/5   Time 8   Period Weeks   Status New     PT LONG TERM GOAL #5   Title imrprove strength and endurance to walk for 30 minutes in the community without need for rest   Time 8   Period Weeks   Status New               Plan - 01/03/17  1001    Clinical Impression Statement Patient has some palpable pain on lateral right patella. Patient is  able to do step up with 4 inches but not 6 inches.  Patient is only able to do step down on 2 inch step.  Patient was able to do leg press without difficulty.  Patient has good range of motion in right knee. Patient will benefit from skilled therapy to improve right knee strength and wean her from the knee immobilizer.    Rehab Potential Excellent   Clinical Impairments Affecting Rehab Potential Right TKR 2017   PT Frequency 2x / week   PT Duration 8 weeks   PT Treatment/Interventions Iontophoresis 38m/ml Dexamethasone;Electrical Stimulation;Cryotherapy;Moist Heat;Gait training;Stair training;Patient/family education;Neuromuscular re-education;Therapeutic exercise;Therapeutic activities;Manual techniques;Passive range of motion;Taping;Vasopneumatic Device   PT Next Visit Plan  ionto #3; right knee strengthening for HEP, Nustep; work on getting up and down from floor without pressure on right knee; soft tissue work to right knee   PT Home Exercise Plan HEP with theraband for right knee; step up and down, hamstring stretch, quad stretch   Consulted and Agree with Plan of Care Patient      Patient will benefit from skilled therapeutic intervention in order to improve the following deficits and impairments:  Abnormal gait, Difficulty walking, Pain, Decreased strength, Decreased activity tolerance, Decreased endurance, Increased edema  Visit Diagnosis: Acute pain of right knee  Muscle weakness (generalized)  Localized edema  Stiffness of right knee, not elsewhere classified     Problem List Patient Active Problem List   Diagnosis Date Noted  . Status post total right knee replacement 12/22/2015  . Hepatic steatosis 02/09/2015  . Hot flashes 09/09/2014  . Osteopenia 09/09/2014  . Breast [place] of [place]inner quadrant of [place] breast [place] 07/23/2013  . Memory loss 07/14/2013  . Headache 07/14/2013  . Other malaise and fatigue 07/14/2013  . [place] ([place]  10/07/2011  . Leukocytosis 10/07/2011  . Thyroid mass, left inferior lobe, 3.6cm [id number]/16/2013  . Hyponatremia 10/07/2011  . Obesity (BMI 30-39.9) 08/07/2011  . Post concussion syndrome     CEarlie Counts PT 01/03/17 10:30 AM   [facility] [address] [place] [address]Phone: [phone]  Fax:  [phone] Name: DAYRABELLA LABOMBARDMRN: [id number] of Birth: [birth year]

## 2017-01-05 ENCOUNTER — Ambulatory Visit: Payer: Medicare Other | Admitting: Physical Therapy

## 2017-01-05 ENCOUNTER — Encounter: Payer: Self-pay | Admitting: Physical Therapy

## 2017-01-05 DIAGNOSIS — M25661 Stiffness of right knee, not elsewhere classified: Secondary | ICD-10-CM

## 2017-01-05 DIAGNOSIS — R600 Localized edema: Secondary | ICD-10-CM

## 2017-01-05 DIAGNOSIS — M6281 Muscle weakness (generalized): Secondary | ICD-10-CM

## 2017-01-05 DIAGNOSIS — M25561 Pain in right knee: Secondary | ICD-10-CM

## 2017-01-05 NOTE — Therapy (Signed)
[facility] [address] [place], [place], [address] Phone: [phone]   Fax:  [phone]  Physical Therapy Treatment  Patient Details  Name: KARRYN KOSINSKI MRN: [id number] Date of Birth: [birth year] Referring Provider: Dr. Rodell Perna  Encounter Date: 01/05/2017      PT End of Session - 01/05/17 1135    Visit Number 3   Date for PT Re-Evaluation 02/15/17   PT Start Time 1055   PT Stop Time 1136   PT Time Calculation (min) 41 min   Activity Tolerance Patient tolerated treatment well   Behavior During Therapy [facility] for tasks assessed/performed      Past Medical History:  Diagnosis Date  . Abdominal pain   . Breast cancer ([place]) 06/2013   left upper inner  . Cancer ([place])    breast  . Diabetes mellitus without complication ([place])    Takes Tradjenta  . Family history of adverse reaction to anesthesia    Patients mother had to be resusciated after having anesthesia  . GERD (gastroesophageal reflux disease)   . Headache(784.0)    Takes Depakote  . Hernia, incisional periumbilical, incarcerated 12/18/1290  . Hot flashes   . Hx of radiation therapy 09/09/13- 10/13/13   left breast 5000 cGy in 25 sessions, no boost  . Hyperlipidemia   . Hypertension    PCP DR Nolon Rod  . Hypothyroidism   . Peripheral vascular disease ([place])    pulmonary embolis-still have some  . Post concussion syndrome    4 yrs ago- sees [facility]- Dr Leonie Man every 6 months-   has sleep study 2011 following accident but was neg per patient-  short term memory issues, dates and times  . Pulmonary embolism ([place]) 2013  . Recurrent upper respiratory infection (URI)    FLU 08/23/11-08/29/11 then URI 08/29/11- 09/08/11- S/P zpack and steroids-  improved      no cough or congestion  . Thyroid disease   . 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 Ventral hernia     Past Surgical History:  Procedure Laterality Date  . APPENDECTOMY   2002   lap appy.  Dr. Hassell Done  . BREAST LUMPECTOMY WITH NEEDLE LOCALIZATION AND AXILLARY SENTINEL LYMPH NODE BX Left 08/04/2013   Procedure: BREAST LUMPECTOMY WITH NEEDLE LOCALIZATION AND AXILLARY SENTINEL LYMPH NODE Biopsy x 3;  Surgeon: Rolm Bookbinder, MD;  Location: [place];  Service: General;  Laterality: Left;  . BREAST SURGERY    . CARPAL TUNNEL RELEASE  10/2009-12/2010   left - right  . CESAREAN SECTION  X 2  . COLONOSCOPY    . HERNIA REPAIR  09/15/11   Ventral w/mesh  . JOINT REPLACEMENT    . KNEE ARTHROPLASTY Right 12/22/2015   Procedure: COMPUTER ASSISTED TOTAL KNEE ARTHROPLASTY;  Surgeon: Marybelle Killings, MD;  Location: [place];  Service: Orthopedics;  Laterality: Right;  . LAPAROSCOPIC CHOLECYSTECTOMY  2006   Dr. Bubba Camp  . TARSAL TUNNEL RELEASE Bilateral   . THYROIDECTOMY, PARTIAL    . VENTRAL HERNIA REPAIR  09/29/2011   Procedure: LAPAROSCOPIC VENTRAL HERNIA;  Surgeon: Adin Hector, MD;  [facility];  Service: General;  Laterality: N/A;  Laparoscopic Ventral Wall Hernia Repair with Mesh    There were no vitals filed for this visit.      Subjective Assessment - 01/05/17 1106    Subjective Love the ionto patch, I feel much better.    Currently in Pain? Yes   Pain  Score 2    Pain Location Knee   Pain Orientation Right   Pain Descriptors / Indicators Sore   Multiple Pain Sites No                         [facility] Adult PT Treatment/Exercise - 01/05/17 0001      Knee/Hip Exercises: Stretches   Active Hamstring Stretch Right;2 reps;20 seconds   Gastroc Stretch Right;2 reps;20 seconds   Gastroc Stretch Limitations On slant board     Knee/Hip Exercises: Aerobic   Elliptical L1 x 3 min   Recumbent Bike L1 with RPMs> 50 for 10 min     Knee/Hip Exercises: Machines for Strengthening   Total Gym Leg Press Seat 5 Bil 70# 15x 75# 15x, RTLE  30#  2x15     Knee/Hip Exercises: Standing   Forward Step Up Right;2 sets;10 reps;Hand Hold: 2;Step Height: 6"      Knee/Hip Exercises: Seated   Long Arc Quad Strengthening;Right;3 sets;10 reps;Weights   Long Arc Quad Weight 4 lbs.   Hamstring Curl Strengthening;Right;2 sets;15 reps;Weights   Hamstring Limitations 4     Iontophoresis   Type of Iontophoresis Dexamethasone  #3   Location lat. right patella   Dose 1 ml skin intact   Time 6 hr wear     Manual Therapy   Manual Therapy Soft tissue mobilization;Joint mobilization   Joint Mobilization right patella mobilization   Soft tissue mobilization lateral right patella and patella tendon                  PT Short Term Goals - 01/03/17 1028      PT SHORT TERM GOAL #1   Title be independent in initial HEP   Time 4   Period Weeks   Status On-going     PT SHORT TERM GOAL #2   Title ability to ambulate for 20 minutes with no pain or knee immobilizer due to increased endurance   Time 4   Period Weeks   Status On-going           PT Long Term Goals - 12/21/16 1618      PT LONG TERM GOAL #1   Title be independent in advanced HEP   Time 8   Period Weeks   Status New     PT LONG TERM GOAL #2   Title reduce FOTO to < or = to 42% limitation   Time 8   Period Weeks   Status New     PT LONG TERM GOAL #3   Title ascend steps with step-over-step with use of 1 rail   Time 8   Period Weeks   Status New     PT LONG TERM GOAL #4   Title be able to get up and down from the floor to play with her grandchild due to right knee strength >/= 4+/5   Time 8   Period Weeks   Status New     PT LONG TERM GOAL #5   Title imrprove strength and endurance to walk for 30 minutes in the community without need for rest   Time 8   Period Weeks   Status New               Plan - 01/05/17 1136    Clinical Impression Statement Pain decreasing overall per patient. Ionto patches help significantly per pt. Pt was able to ascend 5 inch step/stair during session today with out  pain/difficulty and plans to practice at home as part of her  HEP. She also reports she currently has difficulty walking in stores and is somewhat nervous to try wihtout her immobilizer.    Rehab Potential Excellent   Clinical Impairments Affecting Rehab Potential Right TKR 2017   PT Frequency 2x / week   PT Duration 8 weeks   PT Treatment/Interventions Iontophoresis 20m/ml Dexamethasone;Electrical Stimulation;Cryotherapy;Moist Heat;Gait training;Stair training;Patient/family education;Neuromuscular re-education;Therapeutic exercise;Therapeutic activities;Manual techniques;Passive range of motion;Taping;Vasopneumatic Device   PT Next Visit Plan ionto #4, bike L2, quad strength, single leg stance   Consulted and Agree with Plan of Care Patient      Patient will benefit from skilled therapeutic intervention in order to improve the following deficits and impairments:  Abnormal gait, Difficulty walking, Pain, Decreased strength, Decreased activity tolerance, Decreased endurance, Increased edema  Visit Diagnosis: Acute pain of right knee  Muscle weakness (generalized)  Localized edema  Stiffness of right knee, not elsewhere classified     Problem List Patient Active Problem List   Diagnosis Date Noted  . Status post total right knee replacement 12/22/2015  . Hepatic steatosis 02/09/2015  . Hot flashes 09/09/2014  . Osteopenia 09/09/2014  . Breast cancer of upper-inner quadrant of left female breast ([place] 07/23/2013  . Memory loss 07/14/2013  . Headache 07/14/2013  . Other malaise and fatigue 07/14/2013  . Acute pulmonary embolism ([place] 10/07/2011  . Leukocytosis 10/07/2011  . Thyroid mass, left inferior lobe, 3.6cm [id number]/16/2013  . Hyponatremia 10/07/2011  . Obesity (BMI 30-39.9) 08/07/2011  . Post concussion syndrome     Sallie Maker, PTA 01/05/2017, 11:40 AM  [facility] [address] [place] [address]Phone: [phone]  Fax:  [phone] Name:  DMIKAELAH TROSTLEMRN: [id number] of Birth: 525-Nov-1952

## 2017-01-09 ENCOUNTER — Encounter: Payer: Self-pay | Admitting: Physical Therapy

## 2017-01-12 ENCOUNTER — Encounter: Payer: Self-pay | Admitting: Physical Therapy

## 2017-01-12 ENCOUNTER — Ambulatory Visit: Payer: Medicare Other | Admitting: Physical Therapy

## 2017-01-12 DIAGNOSIS — M25661 Stiffness of right knee, not elsewhere classified: Secondary | ICD-10-CM

## 2017-01-12 DIAGNOSIS — M6281 Muscle weakness (generalized): Secondary | ICD-10-CM

## 2017-01-12 DIAGNOSIS — R600 Localized edema: Secondary | ICD-10-CM

## 2017-01-12 DIAGNOSIS — M25561 Pain in right knee: Secondary | ICD-10-CM

## 2017-01-12 NOTE — Therapy (Signed)
[facility] [address], [place], [place], [address] Phone: [phone]   Fax:  [phone]  Physical Therapy Treatment  Patient Details  Name: Alyssa [name] MRN: [id number] Date of Birth: 13-May-1951 Referring Provider: Dr. Rodell Perna  Encounter Date: 01/12/2017      PT End of Session - 01/12/17 1013    Visit Number 4   Date for PT Re-Evaluation 02/15/17   PT Start Time 1002   PT Stop Time 1045   PT Time Calculation (min) 43 min   Activity Tolerance Patient tolerated treatment well   Behavior During Therapy [facility] for tasks assessed/performed      Past Medical History:  Diagnosis Date  . Abdominal pain   . Breast cancer ([place]) 06/2013   left upper inner  . Cancer ([place])    breast  . Diabetes mellitus without complication ([place])    Takes Tradjenta  . Family history of adverse reaction to anesthesia    Patients mother had to be resusciated after having anesthesia  . GERD (gastroesophageal reflux disease)   . Headache(784.0)    Takes Depakote  . Hernia, incisional periumbilical, incarcerated 3/41/9379  . Hot flashes   . Hx of radiation therapy 09/09/13- 10/13/13   left breast 5000 cGy in 25 sessions, no boost  . Hyperlipidemia   . Hypertension    PCP DR Nolon Rod  . Hypothyroidism   . Peripheral vascular disease ([place])    pulmonary embolis-still have some  . Post concussion syndrome    4 yrs ago- sees [facility]- Dr Leonie Man every 6 months-   has sleep study 2011 following accident but was neg per patient-  short term memory issues, dates and times  . Pulmonary embolism ([place]) 2013  . Recurrent upper respiratory infection (URI)    FLU 08/23/11-08/29/11 then URI 08/29/11- 09/08/11- S/P zpack and steroids-  improved      no cough or congestion  . Thyroid disease   . Thyroid mass, left inferior lobe, 3.6cm [id number] 10/07/2011  . Ventral hernia     Past Surgical History:  Procedure Laterality Date  . APPENDECTOMY   2002   lap appy.  Dr. Hassell Done  . BREAST LUMPECTOMY WITH NEEDLE LOCALIZATION AND AXILLARY SENTINEL LYMPH NODE BX Left 08/04/2013   Procedure: BREAST LUMPECTOMY WITH NEEDLE LOCALIZATION AND AXILLARY SENTINEL LYMPH NODE Biopsy x 3;  Surgeon: Rolm Bookbinder, MD;  Location: [place];  Service: General;  Laterality: Left;  . BREAST SURGERY    . CARPAL TUNNEL RELEASE  10/2009-12/2010   left - right  . CESAREAN SECTION  X 2  . COLONOSCOPY    . HERNIA REPAIR  09/15/11   Ventral w/mesh  . JOINT REPLACEMENT    . KNEE ARTHROPLASTY Right 12/22/2015   Procedure: COMPUTER ASSISTED TOTAL KNEE ARTHROPLASTY;  Surgeon: Marybelle Killings, MD;  Location: [place];  Service: Orthopedics;  Laterality: Right;  . LAPAROSCOPIC CHOLECYSTECTOMY  2006   Dr. Bubba Camp  . TARSAL TUNNEL RELEASE Bilateral   . THYROIDECTOMY, PARTIAL    . VENTRAL HERNIA REPAIR  09/29/2011   Procedure: LAPAROSCOPIC VENTRAL HERNIA;  Surgeon: Adin Hector, MD;  [facility];  Service: General;  Laterality: N/A;  Laparoscopic Ventral Wall Hernia Repair with Mesh    There were no vitals filed for this visit.      Subjective Assessment - 01/12/17 1014    Subjective I had a work trip where I walked far and up & down big hill. I was very  sore muscularly and my knee swelled some.    Currently in Pain? Yes   Pain Score 3    Pain Location Knee   Pain Orientation Right                         [facility] Adult PT Treatment/Exercise - 01/12/17 0001      Knee/Hip Exercises: Stretches   Gastroc Stretch Right;3 reps;20 seconds   Gastroc Stretch Limitations On slant board     Knee/Hip Exercises: Aerobic   Elliptical L1 x 3 min  3 min still challenging   Recumbent Bike L1 with RPMs> 50 for 10 min     Knee/Hip Exercises: Machines for Strengthening   Total Gym Leg Press Seat 5 Bil 70# 15x 75# 15x, RTLE  30#  2x15     Knee/Hip Exercises: Standing   Forward Step Up Right;2 sets;15 reps;Hand Hold: 2;Step Height: 6"   SLS 3x10 sec   Walking  with Sports Cord 20# 10x forward and back     Knee/Hip Exercises: Seated   Long Arc Quad Strengthening;Right;3 sets;10 reps;Weights   Long Arc Quad Weight 4 lbs.   Hamstring Curl Strengthening;Right;2 sets;15 reps;Weights   Hamstring Limitations 4     Iontophoresis   Type of Iontophoresis Dexamethasone  #4   Location lat. right patella   Dose 1 ml skin intact   Time 6 hr wear                  PT Short Term Goals - 01/03/17 1028      PT SHORT TERM GOAL #1   Title be independent in initial HEP   Time 4   Period Weeks   Status On-going     PT SHORT TERM GOAL #2   Title ability to ambulate for 20 minutes with no pain or knee immobilizer due to increased endurance   Time 4   Period Weeks   Status On-going           PT Long Term Goals - 12/21/16 1618      PT LONG TERM GOAL #1   Title be independent in advanced HEP   Time 8   Period Weeks   Status New     PT LONG TERM GOAL #2   Title reduce FOTO to < or = to 42% limitation   Time 8   Period Weeks   Status New     PT LONG TERM GOAL #3   Title ascend steps with step-over-step with use of 1 rail   Time 8   Period Weeks   Status New     PT LONG TERM GOAL #4   Title be able to get up and down from the floor to play with her grandchild due to right knee strength >/= 4+/5   Time 8   Period Weeks   Status New     PT LONG TERM GOAL #5   Title imrprove strength and endurance to walk for 30 minutes in the community without need for rest   Time 8   Period Weeks   Status New               Plan - 01/12/17 1014    Clinical Impression Statement No pain with exercises except on the resisted walking where that reporduced her lateral knee pain some. Pt typically has muscular soreness post PT but no increase in her lateral knee pain. She was walked quite a bit of  distance including a steep hill this past week on a work trip and this did increase her knee pain some. Not using device.    Rehab Potential  Excellent   Clinical Impairments Affecting Rehab Potential Right TKR 2017   PT Frequency 2x / week   PT Duration 8 weeks   PT Treatment/Interventions Iontophoresis 19m/ml Dexamethasone;Electrical Stimulation;Cryotherapy;Moist Heat;Gait training;Stair training;Patient/family education;Neuromuscular re-education;Therapeutic exercise;Therapeutic activities;Manual techniques;Passive range of motion;Taping;Vasopneumatic Device   PT Next Visit Plan ionto #5, bike L2, quad strength, single leg stance   Consulted and Agree with Plan of Care Patient      Patient will benefit from skilled therapeutic intervention in order to improve the following deficits and impairments:  Abnormal gait, Difficulty walking, Pain, Decreased strength, Decreased activity tolerance, Decreased endurance, Increased edema  Visit Diagnosis: Acute pain of right knee  Muscle weakness (generalized)  Localized edema  Stiffness of right knee, not elsewhere classified     Problem List Patient Active Problem List   Diagnosis Date Noted  . Status post total right knee replacement 12/22/2015  . Hepatic steatosis 02/09/2015  . Hot flashes 09/09/2014  . Osteopenia 09/09/2014  . Breast cancer of upper-inner quadrant of left female breast ([place] 07/23/2013  . Memory loss 07/14/2013  . Headache 07/14/2013  . Other malaise and fatigue 07/14/2013  . Acute pulmonary embolism ([place] 10/07/2011  . Leukocytosis 10/07/2011  . Thyroid mass, left inferior lobe, 3.6cm [id number]/16/2013  . Hyponatremia 10/07/2011  . Obesity (BMI 30-39.9) 08/07/2011  . Post concussion syndrome     COCHRAN,JENNIFER, PTA 01/12/2017, 10:50 AM  [facility] [address] [place] [address]Phone: [phone]  Fax:  [phone] Name: Alyssa ZIOLKOWSKIMRN: [id number] of Birth: [birth year]

## 2017-01-16 ENCOUNTER — Ambulatory Visit: Payer: Medicare Other

## 2017-01-16 DIAGNOSIS — M25561 Pain in right knee: Secondary | ICD-10-CM | POA: Diagnosis not present

## 2017-01-16 DIAGNOSIS — R600 Localized edema: Secondary | ICD-10-CM

## 2017-01-16 DIAGNOSIS — M25661 Stiffness of right knee, not elsewhere classified: Secondary | ICD-10-CM

## 2017-01-16 DIAGNOSIS — M6281 Muscle weakness (generalized): Secondary | ICD-10-CM

## 2017-01-16 NOTE — Therapy (Signed)
[facility] [address] [place], [place], [address] Phone: [phone]   Fax:  [phone]  Physical Therapy Treatment  Patient Details  Name: Alyssa [name] MRN: [id number] Date of Birth: [birth year] Referring Provider: Dr. Rodell Perna  Encounter Date: 01/16/2017      PT End of Session - 01/16/17 1008    Visit Number 5   Date for PT Re-Evaluation 02/15/17   PT Start Time 0920   PT Stop Time 1008   PT Time Calculation (min) 48 min   Activity Tolerance Patient tolerated treatment well   Behavior During Therapy [facility] for tasks assessed/performed      Past Medical History:  Diagnosis Date  . Abdominal pain   . Breast cancer ([place]) 06/2013   left upper inner  . Cancer ([place])    breast  . Diabetes mellitus without complication ([place])    Takes Tradjenta  . Family history of adverse reaction to anesthesia    Patients mother had to be resusciated after having anesthesia  . GERD (gastroesophageal reflux disease)   . Headache(784.0)    Takes Depakote  . Hernia, incisional periumbilical, incarcerated 4/85/4627  . Hot flashes   . Hx of radiation therapy 09/09/13- 10/13/13   left breast 5000 cGy in 25 sessions, no boost  . Hyperlipidemia   . Hypertension    PCP DR Nolon Rod  . Hypothyroidism   . Peripheral vascular disease ([place])    pulmonary embolis-still have some  . Post concussion syndrome    4 yrs ago- sees [facility]- Dr Leonie Man every 6 months-   has sleep study 2011 following accident but was neg per patient-  short term memory issues, dates and times  . Pulmonary embolism ([place]) 2013  . Recurrent upper respiratory infection (URI)    FLU 08/23/11-08/29/11 then URI 08/29/11- 09/08/11- S/P zpack and steroids-  improved      no cough or congestion  . Thyroid disease   . Thyroid mass, left inferior lobe, 3.6cm [id number] 10/07/2011  . Ventral hernia     Past Surgical History:  Procedure Laterality Date  . APPENDECTOMY   2002   lap appy.  Dr. Hassell Done  . BREAST LUMPECTOMY WITH NEEDLE LOCALIZATION AND AXILLARY SENTINEL LYMPH NODE BX Left 08/04/2013   Procedure: BREAST LUMPECTOMY WITH NEEDLE LOCALIZATION AND AXILLARY SENTINEL LYMPH NODE Biopsy x 3;  Surgeon: Rolm Bookbinder, MD;  Location: [place];  Service: General;  Laterality: Left;  . BREAST SURGERY    . CARPAL TUNNEL RELEASE  10/2009-12/2010   left - right  . CESAREAN SECTION  X 2  . COLONOSCOPY    . HERNIA REPAIR  09/15/11   Ventral w/mesh  . JOINT REPLACEMENT    . KNEE ARTHROPLASTY Right 12/22/2015   Procedure: COMPUTER ASSISTED TOTAL KNEE ARTHROPLASTY;  Surgeon: Marybelle Killings, MD;  Location: [place];  Service: Orthopedics;  Laterality: Right;  . LAPAROSCOPIC CHOLECYSTECTOMY  2006   Dr. Bubba Camp  . TARSAL TUNNEL RELEASE Bilateral   . THYROIDECTOMY, PARTIAL    . VENTRAL HERNIA REPAIR  09/29/2011   Procedure: LAPAROSCOPIC VENTRAL HERNIA;  Surgeon: Adin Hector, MD;  [facility];  Service: General;  Laterality: N/A;  Laparoscopic Ventral Wall Hernia Repair with Mesh    There were no vitals filed for this visit.      Subjective Assessment - 01/16/17 0932    Subjective Feeling better now.     Currently in Pain? Yes   Pain Score 3  Pain Location Knee   Pain Orientation Right   Pain Descriptors / Indicators Sore   Pain Type Acute pain   Pain Onset More than a month ago   Pain Frequency Intermittent   Aggravating Factors  sitting down, stairs   Pain Relieving Factors rest, ice                         [facility] Adult PT Treatment/Exercise - 01/16/17 0001      Knee/Hip Exercises: Stretches   Active Hamstring Stretch Right;2 reps;20 seconds   Gastroc Stretch Right;3 reps;20 seconds   Gastroc Stretch Limitations On slant board     Knee/Hip Exercises: Aerobic   Recumbent Bike L1 with RPMs> 50 for 8 min     Knee/Hip Exercises: Machines for Strengthening   Total Gym Leg Press Seat 5 Bil 70# 15x 75# 15x, Rt LE  30#  2x15      Knee/Hip Exercises: Standing   Forward Step Up Right;2 sets;15 reps;Hand Hold: 2;Step Height: 6"   Step Down Right;2 sets;10 reps;Step Height: 4";Hand Hold: 1   SLS 3x10 sec   Walking with Sports Cord 20# 10x forward and back     Knee/Hip Exercises: Seated   Long Arc Quad Strengthening;Right;3 sets;10 reps;Weights   Long Arc Quad Weight 4 lbs.   Hamstring Curl --   Hamstring Limitations --     Iontophoresis   Type of Iontophoresis Dexamethasone  #5   Location lat. right patella   Dose 1 ml skin intact   Time 6 hr wear                PT Education - 01/16/17 0938    Education provided Yes   Education Details ionto   Person(s) Educated Patient   Methods Explanation   Comprehension Verbalized understanding          PT Short Term Goals - 01/16/17 0942      PT SHORT TERM GOAL #1   Title be independent in initial HEP   Status Achieved     PT [place] #2   Title ability to ambulate for 20 minutes with no pain or knee immobilizer due to increased endurance   Baseline 15-20 minutes   Time 4   Period Weeks   Status On-going           PT Long Term Goals - 01/16/17 0943      PT LONG TERM GOAL #1   Title be independent in advanced HEP   Time 8   Period Weeks   Status On-going     PT LONG TERM GOAL #3   Title ascend steps with step-over-step with use of 1 rail   Period Weeks   Status On-going     PT LONG TERM GOAL #5   Title imrprove strength and endurance to walk for 30 minutes in the community without need for rest   Time 8   Period Weeks   Status On-going               Plan - 01/16/17 0945    Clinical Impression Statement Pt with improved ability to ascend steps with step-over-step gait.  Descending steps is more challenging with reduced eccentric control on the Rt.  Pt reports 50% overall improvement in symptoms since the start of care.  Pt will continue to benefit from skilled PT for Rt knee strength, endurance, flexibility and 1  more ionto.     Rehab Potential  Excellent   PT Frequency 2x / week   PT Duration 8 weeks   PT Treatment/Interventions Iontophoresis 75m/ml Dexamethasone;Electrical Stimulation;Cryotherapy;Moist Heat;Gait training;Stair training;Patient/family education;Neuromuscular re-education;Therapeutic exercise;Therapeutic activities;Manual techniques;Passive range of motion;Taping;Vasopneumatic Device   PT Next Visit Plan ionto #6, bike L2, quad strength, single leg stance   Consulted and Agree with Plan of Care Patient      Patient will benefit from skilled therapeutic intervention in order to improve the following deficits and impairments:  Abnormal gait, Difficulty walking, Pain, Decreased strength, Decreased activity tolerance, Decreased endurance, Increased edema  Visit Diagnosis: Acute pain of right knee  Muscle weakness (generalized)  Localized edema  Stiffness of right knee, not elsewhere classified     Problem List Patient Active Problem List   Diagnosis Date Noted  . Status post total right knee replacement 12/22/2015  . Hepatic steatosis 02/09/2015  . Hot flashes 09/09/2014  . Osteopenia 09/09/2014  . Breast cancer of upper-inner quadrant of left female breast ([place] 07/23/2013  . Memory loss 07/14/2013  . Headache 07/14/2013  . Other malaise and fatigue 07/14/2013  . Acute pulmonary embolism ([place] 10/07/2011  . Leukocytosis 10/07/2011  . Thyroid mass, left inferior lobe, 3.6cm [id number]/16/2013  . Hyponatremia 10/07/2011  . Obesity (BMI 30-39.9) 08/07/2011  . Post concussion syndrome      KSigurd Sos PT 01/16/17 10:10 AM  [facility] [address] [place] [address]Phone: [phone]  Fax:  [phone] Name: DAMBRI MILTNERMRN: [id number] of Birth: 5May 14, 1952

## 2017-01-16 NOTE — Patient Instructions (Addendum)

## 2017-01-23 ENCOUNTER — Ambulatory Visit: Payer: Medicare Other | Attending: Orthopaedic Surgery

## 2017-01-23 DIAGNOSIS — M25661 Stiffness of right knee, not elsewhere classified: Secondary | ICD-10-CM | POA: Diagnosis present

## 2017-01-23 DIAGNOSIS — R600 Localized edema: Secondary | ICD-10-CM | POA: Diagnosis present

## 2017-01-23 DIAGNOSIS — M25561 Pain in right knee: Secondary | ICD-10-CM | POA: Diagnosis present

## 2017-01-23 DIAGNOSIS — M6281 Muscle weakness (generalized): Secondary | ICD-10-CM | POA: Diagnosis present

## 2017-01-23 NOTE — Therapy (Signed)
[facility] [address], [place], [place], [address] Phone: [phone]   Fax:  [phone]  Physical Therapy Treatment  Patient Details  Name: Alyssa [name] MRN: [id number] Date of Birth: 20-Mar-1951 Referring Provider: Dr. Rodell Perna  Encounter Date: 01/23/2017      PT End of Session - 01/23/17 1051    Visit Number 6   Date for PT Re-Evaluation 02/15/17   PT Start Time 1012   PT Stop Time 1055   PT Time Calculation (min) 43 min   Activity Tolerance Patient tolerated treatment well   Behavior During Therapy [facility] for tasks assessed/performed      Past Medical History:  Diagnosis Date  . Abdominal pain   . Breast cancer ([place]) 06/2013   left upper inner  . Cancer ([place])    breast  . Diabetes mellitus without complication ([place])    Takes Tradjenta  . Family history of adverse reaction to anesthesia    Patients mother had to be resusciated after having anesthesia  . GERD (gastroesophageal reflux disease)   . Headache(784.0)    Takes Depakote  . Hernia, incisional periumbilical, incarcerated 09/04/9415  . Hot flashes   . Hx of radiation therapy 09/09/13- 10/13/13   left breast 5000 cGy in 25 sessions, no boost  . Hyperlipidemia   . Hypertension    PCP DR Nolon Rod  . Hypothyroidism   . Peripheral vascular disease ([place])    pulmonary embolis-still have some  . Post concussion syndrome    4 yrs ago- sees [facility]- Dr Leonie Man every 6 months-   has sleep study 2011 following accident but was neg per patient-  short term memory issues, dates and times  . Pulmonary embolism ([place]) 2013  . Recurrent upper respiratory infection (URI)    FLU 08/23/11-08/29/11 then URI 08/29/11- 09/08/11- S/P zpack and steroids-  improved      no cough or congestion  . Thyroid disease   . 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 Ventral hernia     Past Surgical History:  Procedure Laterality Date  . APPENDECTOMY   2002   lap appy.  Dr. Hassell Done  . BREAST LUMPECTOMY WITH NEEDLE LOCALIZATION AND AXILLARY SENTINEL LYMPH NODE BX Left 08/04/2013   Procedure: BREAST LUMPECTOMY WITH NEEDLE LOCALIZATION AND AXILLARY SENTINEL LYMPH NODE Biopsy x 3;  Surgeon: Rolm Bookbinder, MD;  Location: [place];  Service: General;  Laterality: Left;  . BREAST SURGERY    . CARPAL TUNNEL RELEASE  10/2009-12/2010   left - right  . CESAREAN SECTION  X 2  . COLONOSCOPY    . HERNIA REPAIR  09/15/11   Ventral w/mesh  . JOINT REPLACEMENT    . KNEE ARTHROPLASTY Right 12/22/2015   Procedure: COMPUTER ASSISTED TOTAL KNEE ARTHROPLASTY;  Surgeon: Marybelle Killings, MD;  Location: [place];  Service: Orthopedics;  Laterality: Right;  . LAPAROSCOPIC CHOLECYSTECTOMY  2006   Dr. Bubba Camp  . TARSAL TUNNEL RELEASE Bilateral   . THYROIDECTOMY, PARTIAL    . VENTRAL HERNIA REPAIR  09/29/2011   Procedure: LAPAROSCOPIC VENTRAL HERNIA;  Surgeon: Adin Hector, MD;  [facility];  Service: General;  Laterality: N/A;  Laparoscopic Ventral Wall Hernia Repair with Mesh    There were no vitals filed for this visit.      Subjective Assessment - 01/23/17 1021    Subjective Doing well.  No pain.   Currently in Pain? No/denies  [facility] Adult PT Treatment/Exercise - 01/23/17 0001      Knee/Hip Exercises: Stretches   Active Hamstring Stretch Right;2 reps;20 seconds   Gastroc Stretch Right;3 reps;20 seconds   Gastroc Stretch Limitations On slant board     Knee/Hip Exercises: Aerobic   Recumbent Bike L2 with RPMs> 50 for 8 min     Knee/Hip Exercises: Machines for Strengthening   Total Gym Leg Press Seat 5 Bil 75# x 30, Rt LE  30#  2x15     Knee/Hip Exercises: Standing   Forward Step Up Right;2 sets;15 reps;Hand Hold: 2;Step Height: 6"   Step Down Right;2 sets;10 reps;Step Height: 4";Hand Hold: 1   SLS 3x10 sec   Walking with Sports Cord 20# 10x forward and back, sidestepping x 10      Knee/Hip Exercises: Seated    Long Arc Quad Strengthening;Right;3 sets;10 reps;Weights   Long Arc Quad Weight 4 lbs.   Hamstring Curl Strengthening;Both;20 reps   Hamstring Limitations red theraband     Iontophoresis   Type of Iontophoresis Dexamethasone  #6   Location lat. right patella   Dose 1 ml skin intact   Time 6 hr wear                PT Education - 01/23/17 1020    Education provided Yes   Education Details ionto   Person(s) Educated Patient   Methods Explanation;Handout   Comprehension Verbalized understanding          PT Short Term Goals - 01/23/17 1021      PT SHORT TERM GOAL #2   Title ability to ambulate for 20 minutes with no pain or knee immobilizer due to increased endurance   Baseline 20 minutes   Status Achieved           PT Long Term Goals - 01/23/17 1022      PT LONG TERM GOAL #3   Title ascend steps with step-over-step with use of 1 rail   Time 8   Period Weeks   Status On-going     PT LONG TERM GOAL #4   Title be able to get up and down from the floor to play with her grandchild due to right knee strength >/= 4+/5   Time 8   Period Weeks   Status On-going               Plan - 01/23/17 1022    Clinical Impression Statement Pt with no pain today.  Pt able to walk ~20 minutes in the community without limitation. Decending steps continues to be challenging due to reduced eccentric control of quads.  Pt reports 50% overall improvement in symptoms since the start of care.  Pt will continue to benefit from skilled PT for Rt knee strength, flexibility and endurance.     Rehab Potential Excellent   Clinical Impairments Affecting Rehab Potential Right TKR 2017   PT Frequency 2x / week   PT Duration 8 weeks   PT Treatment/Interventions Iontophoresis 22m/ml Dexamethasone;Electrical Stimulation;Cryotherapy;Moist Heat;Gait training;Stair training;Patient/family education;Neuromuscular re-education;Therapeutic exercise;Therapeutic activities;Manual  techniques;Passive range of motion;Taping;Vasopneumatic Device   PT Next Visit Plan Done with ionto, bike Level 2-3, eccentric strength on Rt   Consulted and Agree with Plan of Care Patient      Patient will benefit from skilled therapeutic intervention in order to improve the following deficits and impairments:  Abnormal gait, Difficulty walking, Pain, Decreased strength, Decreased activity tolerance, Decreased endurance, Increased edema  Visit Diagnosis: Acute pain of right  knee  Muscle weakness (generalized)  Localized edema  Stiffness of right knee, not elsewhere classified     Problem List Patient Active Problem List   Diagnosis Date Noted  . Status post total right knee replacement 12/22/2015  . Hepatic steatosis 02/09/2015  . Hot flashes 09/09/2014  . Osteopenia 09/09/2014  . Breast cancer of upper-inner quadrant of left female breast ([place]) 07/23/2013  . Memory loss 07/14/2013  . Headache 07/14/2013  . Other malaise and fatigue 07/14/2013  . Acute pulmonary embolism ([place]) 10/07/2011  . Leukocytosis 10/07/2011  . Thyroid mass, left inferior lobe, 3.6cm [id number] 10/07/2011  . Hyponatremia 10/07/2011  . Obesity (BMI 30-39.9) 08/07/2011  . Post concussion syndrome     Znya Albino 01/23/2017, 10:52 AM  [facility] [address], [place], [place], [address] Phone: [phone]   Fax:  [phone]  Name: JANEENE SAND MRN: [id number] Date of Birth: 1951/03/25

## 2017-01-23 NOTE — Patient Instructions (Addendum)

## 2017-01-25 ENCOUNTER — Encounter: Payer: Self-pay | Admitting: Physical Therapy

## 2017-01-25 ENCOUNTER — Ambulatory Visit: Payer: Medicare Other | Admitting: Physical Therapy

## 2017-01-25 DIAGNOSIS — M25561 Pain in right knee: Secondary | ICD-10-CM | POA: Diagnosis not present

## 2017-01-25 DIAGNOSIS — M6281 Muscle weakness (generalized): Secondary | ICD-10-CM

## 2017-01-25 DIAGNOSIS — M25661 Stiffness of right knee, not elsewhere classified: Secondary | ICD-10-CM

## 2017-01-25 DIAGNOSIS — R600 Localized edema: Secondary | ICD-10-CM

## 2017-01-25 NOTE — Therapy (Signed)
[facility] [address], [place], [place], [address] Phone: [phone]   Fax:  [phone]  Physical Therapy Treatment  Patient Details  Name: Alyssa [name] MRN: [id number] Date of Birth: 1951-04-15 Referring Provider: Dr. Rodell Perna  Encounter Date: 01/25/2017      PT End of Session - 01/25/17 0929    Visit Number 7   Number of Visits 10   Date for PT Re-Evaluation 02/15/17   Authorization Type medicare g-code 10th visit   PT Start Time 0930   PT Stop Time 1010   PT Time Calculation (min) 40 min   Activity Tolerance Patient tolerated treatment well   Behavior During Therapy [facility] for tasks assessed/performed      Past Medical History:  Diagnosis Date  . Abdominal pain   . Breast cancer ([place]) 06/2013   left upper inner  . Cancer ([place])    breast  . Diabetes mellitus without complication ([place])    Takes Tradjenta  . Family history of adverse reaction to anesthesia    Patients mother had to be resusciated after having anesthesia  . GERD (gastroesophageal reflux disease)   . Headache(784.0)    Takes Depakote  . Hernia, incisional periumbilical, incarcerated 1/49/7026  . Hot flashes   . Hx of radiation therapy 09/09/13- 10/13/13   left breast 5000 cGy in 25 sessions, no boost  . Hyperlipidemia   . Hypertension    PCP DR Nolon Rod  . Hypothyroidism   . Peripheral vascular disease ([place])    pulmonary embolis-still have some  . Post concussion syndrome    4 yrs ago- sees [facility]- Dr Leonie Man every 6 months-   has sleep study 2011 following accident but was neg per patient-  short term memory issues, dates and times  . Pulmonary embolism ([place]) 2013  . Recurrent upper respiratory infection (URI)    FLU 08/23/11-08/29/11 then URI 08/29/11- 09/08/11- S/P zpack and steroids-  improved      no cough or congestion  . Thyroid disease   . 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 Ventral hernia      Past Surgical History:  Procedure Laterality Date  . APPENDECTOMY  2002   lap appy.  Dr. Hassell Done  . BREAST LUMPECTOMY WITH NEEDLE LOCALIZATION AND AXILLARY SENTINEL LYMPH NODE BX Left 08/04/2013   Procedure: BREAST LUMPECTOMY WITH NEEDLE LOCALIZATION AND AXILLARY SENTINEL LYMPH NODE Biopsy x 3;  Surgeon: Rolm Bookbinder, MD;  Location: [place];  Service: General;  Laterality: Left;  . BREAST SURGERY    . CARPAL TUNNEL RELEASE  10/2009-12/2010   left - right  . CESAREAN SECTION  X 2  . COLONOSCOPY    . HERNIA REPAIR  09/15/11   Ventral w/mesh  . JOINT REPLACEMENT    . KNEE ARTHROPLASTY Right 12/22/2015   Procedure: COMPUTER ASSISTED TOTAL KNEE ARTHROPLASTY;  Surgeon: Marybelle Killings, MD;  Location: [place];  Service: Orthopedics;  Laterality: Right;  . LAPAROSCOPIC CHOLECYSTECTOMY  2006   Dr. Bubba Camp  . TARSAL TUNNEL RELEASE Bilateral   . THYROIDECTOMY, PARTIAL    . VENTRAL HERNIA REPAIR  09/29/2011   Procedure: LAPAROSCOPIC VENTRAL HERNIA;  Surgeon: Adin Hector, MD;  [facility];  Service: General;  Laterality: N/A;  Laparoscopic Ventral Wall Hernia Repair with Mesh    There were no vitals filed for this visit.      Subjective Assessment - 01/25/17 0932    Subjective I still cannot do  steps.  I can do the short step but not regular steps. My knee feels tight today.    Patient Stated Goals back to prior function 98 degrees knee flexion   Currently in Pain? No/denies            [facility] PT Assessment - 01/25/17 0001      Strength   Right/Left Knee Right   Right Knee Flexion 4+/5   Right Knee Extension 4-/5                     [facility] Adult PT Treatment/Exercise - 01/25/17 0001      Knee/Hip Exercises: Aerobic   Recumbent Bike L2 with RPMs> 50 for 8 min     Knee/Hip Exercises: Machines for Strengthening   Total Gym Leg Press Seat 5 Bil 75# x 30, Rt LE  35#  2x15     Knee/Hip Exercises: Standing   Forward Step Up Right;2 sets;15 reps;Hand Hold: 2;Step  Height: 6"   Forward Step Up Limitations use right leg as much as possible   Step Down Right;1 set;15 reps;Step Height: 6";Hand Hold: 2  go slowly and use right leg mostly   SLS with Vectors 10x front, sideways, and backward with left foot on slider and right foot stationary.    Walking with Sports Cord 20# 10x forward and back, sidestepping x 10   going to the left is hardest     Knee/Hip Exercises: Seated   Long Arc Quad Strengthening;Right;3 sets;10 reps;Weights   Long Arc Quad Weight 5 lbs.   Long [employer] Limitations 15 times slowly; 15 times fast                  PT Short Term Goals - 01/23/17 1021      PT SHORT TERM GOAL #2   Title ability to ambulate for 20 minutes with no pain or knee immobilizer due to increased endurance   Baseline 20 minutes   Status Achieved           PT Long Term Goals - 01/23/17 1022      PT LONG TERM GOAL #3   Title ascend steps with step-over-step with use of 1 rail   Time 8   Period Weeks   Status On-going     PT LONG TERM GOAL #4   Title be able to get up and down from the floor to play with her grandchild due to right knee strength >/= 4+/5   Time 8   Period Weeks   Status On-going               Plan - 01/25/17 0949    Clinical Impression Statement Patient has difficulty with stepping down 6 inch steps she has at home.  Patient has more difficulty with going to the left with the sports cord. Patient was able to increase weight with leg press and LAQ. Patient had difficulty with standing on right leg and move left leg different directions on the slider. Patient will benefit from skilled therapy to improve strength and eccentric contraction of the right quads so she is able to go down steps with greater ease.    Rehab Potential Excellent   Clinical Impairments Affecting Rehab Potential Right TKR 2017   PT Frequency 2x / week   PT Duration 8 weeks   PT Treatment/Interventions Iontophoresis 31m/ml  Dexamethasone;Electrical Stimulation;Cryotherapy;Moist Heat;Gait training;Stair training;Patient/family education;Neuromuscular re-education;Therapeutic exercise;Therapeutic activities;Manual techniques;Passive range of motion;Taping;Vasopneumatic Device   PT Next Visit Plan  bike Level 2-3, eccentric strength on Rt   PT Home Exercise Plan progress as needed   Consulted and Agree with Plan of Care Patient      Patient will benefit from skilled therapeutic intervention in order to improve the following deficits and impairments:  Abnormal gait, Difficulty walking, Pain, Decreased strength, Decreased activity tolerance, Decreased endurance, Increased edema  Visit Diagnosis: Acute pain of right knee  Muscle weakness (generalized)  Localized edema  Stiffness of right knee, not elsewhere classified     Problem List Patient Active Problem List   Diagnosis Date Noted  . Status post total right knee replacement 12/22/2015  . Hepatic steatosis 02/09/2015  . Hot flashes 09/09/2014  . Osteopenia 09/09/2014  . Breast cancer of upper-inner quadrant of left female breast ([place]) 07/23/2013  . Memory loss 07/14/2013  . Headache 07/14/2013  . Other malaise and fatigue 07/14/2013  . Acute pulmonary embolism ([place]) 10/07/2011  . Leukocytosis 10/07/2011  . Thyroid mass, left inferior lobe, 3.6cm [id number] 10/07/2011  . Hyponatremia 10/07/2011  . Obesity (BMI 30-39.9) 08/07/2011  . Post concussion syndrome     Earlie Counts, PT 01/25/17 10:11 AM   [facility] [address] [place], [place], [address] Phone: [phone]   Fax:  [phone]  Name: Alyssa [name] MRN: [id number] Date of Birth: [birth year]

## 2017-01-29 ENCOUNTER — Ambulatory Visit: Payer: Medicare Other

## 2017-01-29 DIAGNOSIS — M25561 Pain in right knee: Secondary | ICD-10-CM | POA: Diagnosis not present

## 2017-01-29 DIAGNOSIS — M25661 Stiffness of right knee, not elsewhere classified: Secondary | ICD-10-CM

## 2017-01-29 DIAGNOSIS — M6281 Muscle weakness (generalized): Secondary | ICD-10-CM

## 2017-01-29 NOTE — Therapy (Signed)
[facility] [address], [place], [place], [address] Phone: [phone]   Fax:  [phone]  Physical Therapy Treatment  Patient Details  Name: Alyssa [name] MRN: [id number] Date of Birth: [birth year] Referring Provider: Dr. Rodell Perna  Encounter Date: 01/29/2017      PT End of Session - 01/29/17 1605    Visit Number 8   Number of Visits 10   Date for PT Re-Evaluation 02/15/17   Authorization Type medicare g-code 10th visit   PT Start Time 1523   PT Stop Time 1605   PT Time Calculation (min) 42 min   Activity Tolerance Patient tolerated treatment well   Behavior During Therapy [facility] for tasks assessed/performed      Past Medical History:  Diagnosis Date  . Abdominal pain   . Breast cancer ([place]) 06/2013   left upper inner  . Cancer ([place])    breast  . Diabetes mellitus without complication ([place])    Takes Tradjenta  . Family history of adverse reaction to anesthesia    Patients mother had to be resusciated after having anesthesia  . GERD (gastroesophageal reflux disease)   . Headache(784.0)    Takes Depakote  . Hernia, incisional periumbilical, incarcerated 1/77/9390  . Hot flashes   . Hx of radiation therapy 09/09/13- 10/13/13   left breast 5000 cGy in 25 sessions, no boost  . Hyperlipidemia   . Hypertension    PCP DR Nolon Rod  . Hypothyroidism   . Peripheral vascular disease ([place])    pulmonary embolis-still have some  . Post concussion syndrome    4 yrs ago- sees [facility]- Dr Leonie Man every 6 months-   has sleep study 2011 following accident but was neg per patient-  short term memory issues, dates and times  . Pulmonary embolism ([place]) 2013  . Recurrent upper respiratory infection (URI)    FLU 08/23/11-08/29/11 then URI 08/29/11- 09/08/11- S/P zpack and steroids-  improved      no cough or congestion  . Thyroid disease   . Thyroid mass, left inferior lobe, 3.6cm [id number] 10/07/2011  . Ventral hernia      Past Surgical History:  Procedure Laterality Date  . APPENDECTOMY  2002   lap appy.  Dr. Hassell Done  . BREAST LUMPECTOMY WITH NEEDLE LOCALIZATION AND AXILLARY SENTINEL LYMPH NODE BX Left 08/04/2013   Procedure: BREAST LUMPECTOMY WITH NEEDLE LOCALIZATION AND AXILLARY SENTINEL LYMPH NODE Biopsy x 3;  Surgeon: Rolm Bookbinder, MD;  Location: [place];  Service: General;  Laterality: Left;  . BREAST SURGERY    . CARPAL TUNNEL RELEASE  10/2009-12/2010   left - right  . CESAREAN SECTION  X 2  . COLONOSCOPY    . HERNIA REPAIR  09/15/11   Ventral w/mesh  . JOINT REPLACEMENT    . KNEE ARTHROPLASTY Right 12/22/2015   Procedure: COMPUTER ASSISTED TOTAL KNEE ARTHROPLASTY;  Surgeon: Marybelle Killings, MD;  Location: [place];  Service: Orthopedics;  Laterality: Right;  . LAPAROSCOPIC CHOLECYSTECTOMY  2006   Dr. Bubba Camp  . TARSAL TUNNEL RELEASE Bilateral   . THYROIDECTOMY, PARTIAL    . VENTRAL HERNIA REPAIR  09/29/2011   Procedure: LAPAROSCOPIC VENTRAL HERNIA;  Surgeon: Adin Hector, MD;  [facility];  Service: General;  Laterality: N/A;  Laparoscopic Ventral Wall Hernia Repair with Mesh    There were no vitals filed for this visit.      Subjective Assessment - 01/29/17 1528    Subjective I have been doing  my exercises.  Steps are still a challenge.     Currently in Pain? No/denies                         [facility] Adult PT Treatment/Exercise - 01/29/17 0001      Knee/Hip Exercises: Aerobic   Recumbent Bike L2 with RPMs> 50 for 10  min     Knee/Hip Exercises: Machines for Strengthening   Total Gym Leg Press Seat 5 Bil 75# x 30, Rt LE  35#  2x15     Knee/Hip Exercises: Standing   Forward Step Up Limitations up and down steps with use of 1 rail   SLS with Vectors 10x front, sideways, and backward with left foot on slider and right foot stationary.    Walking with Sports Cord 20# 10x forward and back, sidestepping x 10   going to the left is hardest     Knee/Hip Exercises: Seated    Long Arc Quad Strengthening;Right;3 sets;10 reps;Weights   Long Arc Quad Weight 5 lbs.                  PT Short Term Goals - 01/29/17 1537      PT SHORT TERM GOAL #1   Title be independent in initial HEP   Status Achieved     PT SHORT TERM GOAL #2   Title ability to ambulate for 20 minutes with no pain or knee immobilizer due to increased endurance   Status Achieved           PT Long Term Goals - 01/29/17 1535      PT LONG TERM GOAL #1   Title be independent in advanced HEP   Time 8   Period Weeks   Status On-going     PT LONG TERM GOAL #2   Title reduce FOTO to < or = to 42% limitation   Time 8   Period Weeks   Status On-going     PT LONG TERM GOAL #3   Title ascend steps with step-over-step with use of 1 rail   Time 8   Period Weeks   Status On-going               Plan - 01/29/17 1541    Clinical Impression Statement Pt able to ascend and descend 6" steps in the clinic today with use of 1 rail.  Some uncontrolled descent with step-down with fatigue.  Pt with reduced pain overall and denies any pain over the past 4 days.  Pt with functional weakness with eccentric exercise and functional tasks.  Pt will continue to benefit from skilled PT for knee strength, endurance and proprioception   Rehab Potential Excellent   Clinical Impairments Affecting Rehab Potential Right TKR 2017   PT Frequency 2x / week   PT Duration 8 weeks   PT Treatment/Interventions Iontophoresis 68m/ml Dexamethasone;Electrical Stimulation;Cryotherapy;Moist Heat;Gait training;Stair training;Patient/family education;Neuromuscular re-education;Therapeutic exercise;Therapeutic activities;Manual techniques;Passive range of motion;Taping;Vasopneumatic Device   PT Next Visit Plan  bike Level 2-3, eccentric strength on Rt, endurance and proprioception   Recommended Other Services initial certification is signed   Consulted and Agree with Plan of Care Patient      Patient will  benefit from skilled therapeutic intervention in order to improve the following deficits and impairments:  Abnormal gait, Difficulty walking, Pain, Decreased strength, Decreased activity tolerance, Decreased endurance, Increased edema  Visit Diagnosis: Acute pain of right knee  Muscle weakness (generalized)  Stiffness of right  knee, not elsewhere classified     Problem List Patient Active Problem List   Diagnosis Date Noted  . Status post total right knee replacement 12/22/2015  . Hepatic steatosis 02/09/2015  . Hot flashes 09/09/2014  . Osteopenia 09/09/2014  . Breast cancer of upper-inner quadrant of left female breast ([place]) 07/23/2013  . Memory loss 07/14/2013  . Headache 07/14/2013  . Other malaise and fatigue 07/14/2013  . Acute pulmonary embolism ([place]) 10/07/2011  . Leukocytosis 10/07/2011  . Thyroid mass, left inferior lobe, 3.6cm [id number] 10/07/2011  . Hyponatremia 10/07/2011  . Obesity (BMI 30-39.9) 08/07/2011  . Post concussion syndrome     Sigurd Sos, PT 01/29/17 4:07 PM  [facility] [address], [place], [place], [address] Phone: [phone]   Fax:  [phone]  Name: BASMA BUCHNER MRN: [id number] Date of Birth: 09/20/1950

## 2017-01-31 ENCOUNTER — Encounter: Payer: Self-pay | Admitting: Physical Therapy

## 2017-01-31 ENCOUNTER — Ambulatory Visit: Payer: Medicare Other | Admitting: Physical Therapy

## 2017-01-31 DIAGNOSIS — M25561 Pain in right knee: Secondary | ICD-10-CM | POA: Diagnosis not present

## 2017-01-31 DIAGNOSIS — M25661 Stiffness of right knee, not elsewhere classified: Secondary | ICD-10-CM

## 2017-01-31 DIAGNOSIS — R600 Localized edema: Secondary | ICD-10-CM

## 2017-01-31 DIAGNOSIS — M6281 Muscle weakness (generalized): Secondary | ICD-10-CM

## 2017-01-31 NOTE — Therapy (Signed)
[facility] [address] [place], [place], [address] Phone: [phone]   Fax:  [phone]  Physical Therapy Treatment  Patient Details  Name: Alyssa [name] MRN: [id number] Date of Birth: 1951/02/29 Referring Provider: Dr. Rodell Perna  Encounter Date: 01/31/2017      PT End of Session - 01/31/17 1446    Visit Number 9   Number of Visits 10   Date for PT Re-Evaluation 02/15/17   Authorization Type medicare g-code 10th visit   PT Start Time 9233   PT Stop Time 1540   PT Time Calculation (min) 54 min   Activity Tolerance Patient tolerated treatment well   Behavior During Therapy [facility] for tasks assessed/performed      Past Medical History:  Diagnosis Date  . Abdominal pain   . Breast cancer ([place]) 06/2013   left upper inner  . Cancer ([place])    breast  . Diabetes mellitus without complication ([place])    Takes Tradjenta  . Family history of adverse reaction to anesthesia    Patients mother had to be resusciated after having anesthesia  . GERD (gastroesophageal reflux disease)   . Headache(784.0)    Takes Depakote  . Hernia, incisional periumbilical, incarcerated 0/01/6225  . Hot flashes   . Hx of radiation therapy 09/09/13- 10/13/13   left breast 5000 cGy in 25 sessions, no boost  . Hyperlipidemia   . Hypertension    PCP DR Nolon Rod  . Hypothyroidism   . Peripheral vascular disease ([place])    pulmonary embolis-still have some  . Post concussion syndrome    4 yrs ago- sees [facility]- Dr Leonie Man every 6 months-   has sleep study 2011 following accident but was neg per patient-  short term memory issues, dates and times  . Pulmonary embolism ([place]) 2013  . Recurrent upper respiratory infection (URI)    FLU 08/23/11-08/29/11 then URI 08/29/11- 09/08/11- S/P zpack and steroids-  improved      no cough or congestion  . Thyroid disease   . 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 Ventral hernia      Past Surgical History:  Procedure Laterality Date  . APPENDECTOMY  2002   lap appy.  Dr. Hassell Done  . BREAST LUMPECTOMY WITH NEEDLE LOCALIZATION AND AXILLARY SENTINEL LYMPH NODE BX Left 08/04/2013   Procedure: BREAST LUMPECTOMY WITH NEEDLE LOCALIZATION AND AXILLARY SENTINEL LYMPH NODE Biopsy x 3;  Surgeon: Rolm Bookbinder, MD;  Location: [place];  Service: General;  Laterality: Left;  . BREAST SURGERY    . CARPAL TUNNEL RELEASE  10/2009-12/2010   left - right  . CESAREAN SECTION  X 2  . COLONOSCOPY    . HERNIA REPAIR  09/15/11   Ventral w/mesh  . JOINT REPLACEMENT    . KNEE ARTHROPLASTY Right 12/22/2015   Procedure: COMPUTER ASSISTED TOTAL KNEE ARTHROPLASTY;  Surgeon: Marybelle Killings, MD;  Location: [place];  Service: Orthopedics;  Laterality: Right;  . LAPAROSCOPIC CHOLECYSTECTOMY  2006   Dr. Bubba Camp  . TARSAL TUNNEL RELEASE Bilateral   . THYROIDECTOMY, PARTIAL    . VENTRAL HERNIA REPAIR  09/29/2011   Procedure: LAPAROSCOPIC VENTRAL HERNIA;  Surgeon: Adin Hector, MD;  [facility];  Service: General;  Laterality: N/A;  Laparoscopic Ventral Wall Hernia Repair with Mesh    There were no vitals filed for this visit.      Subjective Assessment - 01/31/17 1449    Subjective Knee feels thick, kind  of tight.    Patient Stated Goals back to prior function 98 degrees knee flexion   Currently in Pain? Yes   Pain Score 3    Pain Location Knee   Pain Orientation Right   Pain Descriptors / Indicators Discomfort;Tightness   Aggravating Factors  stairs   Pain Relieving Factors ice   Multiple Pain Sites No                         [facility] Adult PT Treatment/Exercise - 01/31/17 0001      Knee/Hip Exercises: Stretches   Gastroc Stretch --  3x 1 min on slant board     Knee/Hip Exercises: Aerobic   Recumbent Bike L3 with RPMs> 50 for 10  min     Knee/Hip Exercises: Machines for Strengthening   Total Gym Leg Press Seat 5: Bil 75# 30x, RTLE 35# 3x10  look to increase weight  next     Knee/Hip Exercises: Standing   Stairs 4 stairs x 3 in between gastroc stretches   Walking with Sports Cord 25# 10x forward and back, 20# sidestepping x 10   going to the left is hardest     Knee/Hip Exercises: Seated   Long Arc Quad Strengthening;Right;3 sets;10 reps;Weights   Long Arc Quad Weight 5 lbs.   Long Arc Quad Limitations 3 sec hold with DF stretch     Vasopneumatic   Number Minutes Vasopneumatic  15 minutes   Vasopnuematic Location  Knee   Vasopneumatic Pressure High   Vasopneumatic Temperature  3 flakes                  PT Short Term Goals - 01/29/17 1537      PT SHORT TERM GOAL #1   Title be independent in initial HEP   Status Achieved     PT SHORT TERM GOAL #2   Title ability to ambulate for 20 minutes with no pain or knee immobilizer due to increased endurance   Status Achieved           PT Long Term Goals - 01/29/17 1535      PT LONG TERM GOAL #1   Title be independent in advanced HEP   Time 8   Period Weeks   Status On-going     PT LONG TERM GOAL #2   Title reduce FOTO to < or = to 42% limitation   Time 8   Period Weeks   Status On-going     PT LONG TERM GOAL #3   Title ascend steps with step-over-step with use of 1 rail   Time 8   Period Weeks   Status On-going               Plan - 01/31/17 1524    Clinical Impression Statement Knee felt thick and tight today. Tried the vasopneumatic device post session to help with the edema. Pt continues to improve with eccentric activities like going down th estairs. Pt was able to tolerate an increase in weights for reesisted walking today.     PT Next Visit Plan  bike Level 3, eccentric strength on Rt, endurance and proprioception   Consulted and Agree with Plan of Care Patient      Patient will benefit from skilled therapeutic intervention in order to improve the following deficits and impairments:  Abnormal gait, Difficulty walking, Pain, Decreased strength, Decreased  activity tolerance, Decreased endurance, Increased edema  Visit Diagnosis: Acute pain of right  knee  Muscle weakness (generalized)  Stiffness of right knee, not elsewhere classified  Localized edema     Problem List Patient Active Problem List   Diagnosis Date Noted  . Status post total right knee replacement 12/22/2015  . Hepatic steatosis 02/09/2015  . Hot flashes 09/09/2014  . Osteopenia 09/09/2014  . Breast cancer of upper-inner quadrant of left female breast ([place]) 07/23/2013  . Memory loss 07/14/2013  . Headache 07/14/2013  . Other malaise and fatigue 07/14/2013  . Acute pulmonary embolism ([place]) 10/07/2011  . Leukocytosis 10/07/2011  . 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 Hyponatremia 10/07/2011  . Obesity (BMI 30-39.9) 08/07/2011  . Post concussion syndrome     Elainah Rhyne, PTA 01/31/2017, 3:27 PM  [facility] [address], [place], [place], [address] Phone: [phone]   Fax:  [phone]  Name: Alyssa [name] MRN: [id number] Date of Birth: [birth year]

## 2017-02-05 ENCOUNTER — Ambulatory Visit: Payer: Medicare Other

## 2017-02-05 DIAGNOSIS — M25561 Pain in right knee: Secondary | ICD-10-CM | POA: Diagnosis not present

## 2017-02-05 DIAGNOSIS — M6281 Muscle weakness (generalized): Secondary | ICD-10-CM

## 2017-02-05 DIAGNOSIS — M25661 Stiffness of right knee, not elsewhere classified: Secondary | ICD-10-CM

## 2017-02-05 NOTE — Therapy (Signed)
[facility] [address] [place], [place], [address] Phone: [phone]   Fax:  [phone]  Physical Therapy Treatment  Patient Details  Name: Alyssa [name] MRN: [id number] Date of Birth: 10/01/1950 Referring Provider: Dr. Rodell Perna  Encounter Date: 02/05/2017      PT End of Session - 02/05/17 1551    Visit Number 10   Number of Visits 20   Date for PT Re-Evaluation 02/15/17   Authorization Type medicare g-code 20th visit, KX at 41    PT Start Time 1516   PT Stop Time 1556   PT Time Calculation (min) 40 min   Activity Tolerance Patient tolerated treatment well   Behavior During Therapy [facility] for tasks assessed/performed      Past Medical History:  Diagnosis Date  . Abdominal pain   . Breast cancer ([place]) 06/2013   left upper inner  . Cancer ([place])    breast  . Diabetes mellitus without complication ([place])    Takes Tradjenta  . Family history of adverse reaction to anesthesia    Patients mother had to be resusciated after having anesthesia  . GERD (gastroesophageal reflux disease)   . Headache(784.0)    Takes Depakote  . Hernia, incisional periumbilical, incarcerated 1/88/4166  . Hot flashes   . Hx of radiation therapy 09/09/13- 10/13/13   left breast 5000 cGy in 25 sessions, no boost  . Hyperlipidemia   . Hypertension    PCP DR Nolon Rod  . Hypothyroidism   . Peripheral vascular disease ([place])    pulmonary embolis-still have some  . Post concussion syndrome    4 yrs ago- sees [facility]- Dr Leonie Man every 6 months-   has sleep study 2011 following accident but was neg per patient-  short term memory issues, dates and times  . Pulmonary embolism ([place]) 2013  . Recurrent upper respiratory infection (URI)    FLU 08/23/11-08/29/11 then URI 08/29/11- 09/08/11- S/P zpack and steroids-  improved      no cough or congestion  . Thyroid disease   . 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 Ventral  hernia     Past Surgical History:  Procedure Laterality Date  . APPENDECTOMY  2002   lap appy.  Dr. Hassell Done  . BREAST LUMPECTOMY WITH NEEDLE LOCALIZATION AND AXILLARY SENTINEL LYMPH NODE BX Left 08/04/2013   Procedure: BREAST LUMPECTOMY WITH NEEDLE LOCALIZATION AND AXILLARY SENTINEL LYMPH NODE Biopsy x 3;  Surgeon: Rolm Bookbinder, MD;  Location: [place];  Service: General;  Laterality: Left;  . BREAST SURGERY    . CARPAL TUNNEL RELEASE  10/2009-12/2010   left - right  . CESAREAN SECTION  X 2  . COLONOSCOPY    . HERNIA REPAIR  09/15/11   Ventral w/mesh  . JOINT REPLACEMENT    . KNEE ARTHROPLASTY Right 12/22/2015   Procedure: COMPUTER ASSISTED TOTAL KNEE ARTHROPLASTY;  Surgeon: Marybelle Killings, MD;  Location: [place];  Service: Orthopedics;  Laterality: Right;  . LAPAROSCOPIC CHOLECYSTECTOMY  2006   Dr. Bubba Camp  . TARSAL TUNNEL RELEASE Bilateral   . THYROIDECTOMY, PARTIAL    . VENTRAL HERNIA REPAIR  09/29/2011   Procedure: LAPAROSCOPIC VENTRAL HERNIA;  Surgeon: Adin Hector, MD;  [facility];  Service: General;  Laterality: N/A;  Laparoscopic Ventral Wall Hernia Repair with Mesh    There were no vitals filed for this visit.      Subjective Assessment - 02/05/17 1528    Subjective  Feeling good.   Currently in Pain? No/denies            [facility] PT Assessment - 02/05/17 0001      Observation/Other Assessments   Focus on Therapeutic Outcomes (FOTO)  55% limitation                     [facility] Adult PT Treatment/Exercise - 02/05/17 0001      Knee/Hip Exercises: Stretches   Gastroc Stretch --  3x 1 min on slant board     Knee/Hip Exercises: Aerobic   Recumbent Bike L3 with RPMs> 50 for 10  min     Knee/Hip Exercises: Machines for Strengthening   Total Gym Leg Press Seat 5: Bil 75# 30x, RTLE 35# 3x10  look to increase weight next     Knee/Hip Exercises: Standing   Stairs 4 stairs x 3 in between gastroc stretches   SLS with Vectors 10x front, sideways, and  backward with left foot on slider and right foot stationary.    Rebounder weight shifting 3 ways x 1 minute each   Walking with Sports Cord 25# 10x forward and back, 20# sidestepping x 10   going to the left is hardest     Knee/Hip Exercises: Seated   Long Arc Quad Strengthening;Right;3 sets;10 reps;Weights   Long Arc Quad Weight 5 lbs.                  PT Short Term Goals - 01/29/17 1537      PT SHORT TERM GOAL #1   Title be independent in initial HEP   Status Achieved     PT SHORT TERM GOAL #2   Title ability to ambulate for 20 minutes with no pain or knee immobilizer due to increased endurance   Status Achieved           PT Long Term Goals - 02/05/17 1531      PT LONG TERM GOAL #1   Title be independent in advanced HEP   Time 8   Period Weeks   Status On-going     PT LONG TERM GOAL #2   Title reduce FOTO to < or = to 42% limitation   Baseline 555 limitation   Time 8   Period Weeks   Status On-going     PT LONG TERM GOAL #3   Title ascend steps with step-over-step with use of 1 rail   Time 8   Period Weeks   Status On-going     PT LONG TERM GOAL #5   Title imrprove strength and endurance to walk for 30 minutes in the community without need for rest   Status Achieved               Plan - 02/05/17 1532    Clinical Impression Statement Pt reports 80% overall improvement in symptoms since the start of care.  Pt continues to have difficulty with negotiating steps with step-over-step gait yet this has improved.  Pt able to walk > 30 minutes for exercise without limitation.  Pt will continue to benefit from skilled PT to increased functional Rt LE strength to increase ease with getting off the floor with grandkids and with negotiating steps.     Rehab Potential Excellent   PT Frequency 2x / week   PT Duration 8 weeks   PT Treatment/Interventions Iontophoresis 68m/ml Dexamethasone;Electrical Stimulation;Cryotherapy;Moist Heat;Gait training;Stair  training;Patient/family education;Neuromuscular re-education;Therapeutic exercise;Therapeutic activities;Manual techniques;Passive range of motion;Taping;Vasopneumatic Device   PT Next Visit  Plan  bike Level 3, eccentric strength on Rt, endurance and proprioception   Consulted and Agree with Plan of Care Patient      Patient will benefit from skilled therapeutic intervention in order to improve the following deficits and impairments:  Abnormal gait, Difficulty walking, Pain, Decreased strength, Decreased activity tolerance, Decreased endurance, Increased edema  Visit Diagnosis: Acute pain of right knee  Muscle weakness (generalized)  Stiffness of right knee, not elsewhere classified       G-Codes - Mar 03, 2017 1544    Functional Assessment Tool Used (Outpatient Only) FOTO: 55% limitation   Functional Limitation Mobility: Walking and moving around   Mobility: Walking and Moving Around Current Status [phone]) At least 40 percent but less than 60 percent impaired, limited or restricted   Mobility: Walking and Moving Around Goal Status [phone]) At least 40 percent but less than 60 percent impaired, limited or restricted      Problem List Patient Active Problem List   Diagnosis Date Noted  . Status post total right knee replacement 12/22/2015  . Hepatic steatosis 02/09/2015  . Hot flashes 09/09/2014  . Osteopenia 09/09/2014  . Breast cancer of upper-inner quadrant of left female breast ([place]) 07/23/2013  . Memory loss 07/14/2013  . Headache 07/14/2013  . Other malaise and fatigue 07/14/2013  . Acute pulmonary embolism ([place]) 10/07/2011  . Leukocytosis 10/07/2011  . Thyroid mass, left inferior lobe, 3.6cm [id number] 10/07/2011  . Hyponatremia 10/07/2011  . Obesity (BMI 30-39.9) 08/07/2011  . Post concussion syndrome      Sigurd Sos, PT Mar 03, 2017 3:54 PM  [facility] [address] [place], [place], [address] Phone:  [phone]   Fax:  [phone]  Name: Alyssa [name] MRN: [id number] Date of Birth: 23-Jun-1951

## 2017-02-07 ENCOUNTER — Encounter: Payer: Self-pay | Admitting: Physical Therapy

## 2017-02-07 ENCOUNTER — Ambulatory Visit: Payer: Medicare Other | Admitting: Physical Therapy

## 2017-02-07 DIAGNOSIS — M25561 Pain in right knee: Secondary | ICD-10-CM

## 2017-02-07 DIAGNOSIS — M25661 Stiffness of right knee, not elsewhere classified: Secondary | ICD-10-CM

## 2017-02-07 DIAGNOSIS — M6281 Muscle weakness (generalized): Secondary | ICD-10-CM

## 2017-02-07 NOTE — Therapy (Signed)
[facility] [address], [place], [place], [address] Phone: [phone]   Fax:  [phone]  Physical Therapy Treatment  Patient Details  Name: Alyssa [name] MRN: [id number] Date of Birth: [birth year] Referring Provider: Dr. Rodell Perna  Encounter Date: 02/07/2017      PT End of Session - 02/07/17 1432    Visit Number 11   Number of Visits 20   Date for PT Re-Evaluation 02/15/17   Authorization Type medicare g-code 20th visit, KX at 66    PT Start Time 1424   PT Stop Time 1510   PT Time Calculation (min) 46 min   Activity Tolerance Patient tolerated treatment well   Behavior During Therapy [facility] for tasks assessed/performed      Past Medical History:  Diagnosis Date  . Abdominal pain   . Breast cancer ([place]) 06/2013   left upper inner  . Cancer ([place])    breast  . Diabetes mellitus without complication ([place])    Takes Tradjenta  . Family history of adverse reaction to anesthesia    Patients mother had to be resusciated after having anesthesia  . GERD (gastroesophageal reflux disease)   . Headache(784.0)    Takes Depakote  . Hernia, incisional periumbilical, incarcerated 9/47/0962  . Hot flashes   . Hx of radiation therapy 09/09/13- 10/13/13   left breast 5000 cGy in 25 sessions, no boost  . Hyperlipidemia   . Hypertension    PCP DR Nolon Rod  . Hypothyroidism   . Peripheral vascular disease ([place])    pulmonary embolis-still have some  . Post concussion syndrome    4 yrs ago- sees [facility]- Dr Leonie Man every 6 months-   has sleep study 2011 following accident but was neg per patient-  short term memory issues, dates and times  . Pulmonary embolism ([place]) 2013  . Recurrent upper respiratory infection (URI)    FLU 08/23/11-08/29/11 then URI 08/29/11- 09/08/11- S/P zpack and steroids-  improved      no cough or congestion  . Thyroid disease   . Thyroid mass, left inferior lobe, 3.6cm [id number] 10/07/2011  . Ventral  hernia     Past Surgical History:  Procedure Laterality Date  . APPENDECTOMY  2002   lap appy.  Dr. Hassell Done  . BREAST LUMPECTOMY WITH NEEDLE LOCALIZATION AND AXILLARY SENTINEL LYMPH NODE BX Left 08/04/2013   Procedure: BREAST LUMPECTOMY WITH NEEDLE LOCALIZATION AND AXILLARY SENTINEL LYMPH NODE Biopsy x 3;  Surgeon: Rolm Bookbinder, MD;  Location: [place];  Service: General;  Laterality: Left;  . BREAST SURGERY    . CARPAL TUNNEL RELEASE  10/2009-12/2010   left - right  . CESAREAN SECTION  X 2  . COLONOSCOPY    . HERNIA REPAIR  09/15/11   Ventral w/mesh  . JOINT REPLACEMENT    . KNEE ARTHROPLASTY Right 12/22/2015   Procedure: COMPUTER ASSISTED TOTAL KNEE ARTHROPLASTY;  Surgeon: Marybelle Killings, MD;  Location: [place];  Service: Orthopedics;  Laterality: Right;  . LAPAROSCOPIC CHOLECYSTECTOMY  2006   Dr. Bubba Camp  . TARSAL TUNNEL RELEASE Bilateral   . THYROIDECTOMY, PARTIAL    . VENTRAL HERNIA REPAIR  09/29/2011   Procedure: LAPAROSCOPIC VENTRAL HERNIA;  Surgeon: Adin Hector, MD;  [facility];  Service: General;  Laterality: N/A;  Laparoscopic Ventral Wall Hernia Repair with Mesh    There were no vitals filed for this visit.      Subjective Assessment - 02/07/17 1431    Subjective  Feeling good, no pain.   Currently in Pain? No/denies   Multiple Pain Sites No                         [facility] Adult PT Treatment/Exercise - 02/07/17 0001      Knee/Hip Exercises: Stretches   Gastroc Stretch --  3x 1 min on slant board     Knee/Hip Exercises: Aerobic   Elliptical L3 R 2 x 4 min   Recumbent Bike L3 with RPMs> 50 for 10  min     Knee/Hip Exercises: Machines for Strengthening   Total Gym Leg Press Seat 5 Bil 75# 10x, 80# 10x, RTLE 35# 3x 10     Knee/Hip Exercises: Standing   Step Down Right;2 sets;20 reps;Hand Hold: 2;Step Height: 6"   Stairs 4 stairs x 3 in between gastroc stretches   SLS with Vectors 10x front, sideways, and backward with left foot on slider and  right foot stationary.    Rebounder weight shifting 3 ways x 1 minute each   Walking with Sports Cord 25# 10x forward and back,& sidestepping x 10   going to the left is hardest     Knee/Hip Exercises: Seated   Long Arc Quad Strengthening;Right;3 sets;10 reps;Weights   Long Arc Quad Weight 5 lbs.                  PT Short Term Goals - 01/29/17 1537      PT SHORT TERM GOAL #1   Title be independent in initial HEP   Status Achieved     PT SHORT TERM GOAL #2   Title ability to ambulate for 20 minutes with no pain or knee immobilizer due to increased endurance   Status Achieved           PT Long Term Goals - 02/05/17 1531      PT LONG TERM GOAL #1   Title be independent in advanced HEP   Time 8   Period Weeks   Status On-going     PT LONG TERM GOAL #2   Title reduce FOTO to < or = to 42% limitation   Baseline 555 limitation   Time 8   Period Weeks   Status On-going     PT LONG TERM GOAL #3   Title ascend steps with step-over-step with use of 1 rail   Time 8   Period Weeks   Status On-going     PT LONG TERM GOAL #5   Title imrprove strength and endurance to walk for 30 minutes in the community without need for rest   Status Achieved               Plan - 02/07/17 1500    Clinical Impression Statement Pt reporting she is at least 80%-90% better. Stairs, reportedly,  are still challenging.      Rehab Potential Excellent   Clinical Impairments Affecting Rehab Potential Right TKR 2017   PT Frequency 2x / week   PT Duration 8 weeks   PT Treatment/Interventions Iontophoresis 73m/ml Dexamethasone;Electrical Stimulation;Cryotherapy;Moist Heat;Gait training;Stair training;Patient/family education;Neuromuscular re-education;Therapeutic exercise;Therapeutic activities;Manual techniques;Passive range of motion;Taping;Vasopneumatic Device   PT Next Visit Plan  bike Level 3, eccentric strength on Rt, endurance and proprioception   Consulted and Agree with  Plan of Care Patient      Patient will benefit from skilled therapeutic intervention in order to improve the following deficits and impairments:  Abnormal gait, Difficulty walking, Pain, Decreased  strength, Decreased activity tolerance, Decreased endurance, Increased edema  Visit Diagnosis: Acute pain of right knee  Muscle weakness (generalized)  Stiffness of right knee, not elsewhere classified     Problem List Patient Active Problem List   Diagnosis Date Noted  . Status post total right knee replacement 12/22/2015  . Hepatic steatosis 02/09/2015  . Hot flashes 09/09/2014  . Osteopenia 09/09/2014  . Breast cancer of upper-inner quadrant of left female breast ([place]) 07/23/2013  . Memory loss 07/14/2013  . Headache 07/14/2013  . Other malaise and fatigue 07/14/2013  . Acute pulmonary embolism ([place]) 10/07/2011  . Leukocytosis 10/07/2011  . Thyroid mass, left inferior lobe, 3.6cm [id number] 10/07/2011  . Hyponatremia 10/07/2011  . Obesity (BMI 30-39.9) 08/07/2011  . Post concussion syndrome     COCHRAN,JENNIFER, PTA 02/07/2017, 3:09 PM  [facility] [address], [place], [place], [address] Phone: [phone]   Fax:  [phone]  Name: Alyssa [name] MRN: [id number] Date of Birth: Dec 18, 1950

## 2017-02-12 ENCOUNTER — Ambulatory Visit: Payer: Medicare Other

## 2017-02-12 DIAGNOSIS — M25661 Stiffness of right knee, not elsewhere classified: Secondary | ICD-10-CM

## 2017-02-12 DIAGNOSIS — M25561 Pain in right knee: Secondary | ICD-10-CM

## 2017-02-12 DIAGNOSIS — M6281 Muscle weakness (generalized): Secondary | ICD-10-CM

## 2017-02-12 NOTE — Therapy (Signed)
[facility] [address], [place], [place], [address] Phone: [phone]   Fax:  [phone]  Physical Therapy Treatment  Patient Details  Name: Alyssa [name] MRN: [id number] Date of Birth: 05-Feb-1951 Referring Provider: Dr. Rodell Perna  Encounter Date: 02/12/2017      PT End of Session - 02/12/17 1514    Visit Number 12   Number of Visits 20   Date for PT Re-Evaluation 02/15/17   Authorization Type medicare g-code 20th visit, KX at 44    PT Start Time 0093   PT Stop Time 1514  LBP previous session so didn't do elliptical   PT Time Calculation (min) 36 min   Activity Tolerance Patient tolerated treatment well   Behavior During Therapy [facility] for tasks assessed/performed      Past Medical History:  Diagnosis Date  . Abdominal pain   . Breast cancer ([place]) 06/2013   left upper inner  . Cancer ([place])    breast  . Diabetes mellitus without complication ([place])    Takes Tradjenta  . Family history of adverse reaction to anesthesia    Patients mother had to be resusciated after having anesthesia  . GERD (gastroesophageal reflux disease)   . Headache(784.0)    Takes Depakote  . Hernia, incisional periumbilical, incarcerated 03/10/2992  . Hot flashes   . Hx of radiation therapy 09/09/13- 10/13/13   left breast 5000 cGy in 25 sessions, no boost  . Hyperlipidemia   . Hypertension    PCP DR Nolon Rod  . Hypothyroidism   . Peripheral vascular disease ([place])    pulmonary embolis-still have some  . Post concussion syndrome    4 yrs ago- sees [facility]- Dr Leonie Man every 6 months-   has sleep study 2011 following accident but was neg per patient-  short term memory issues, dates and times  . Pulmonary embolism ([place]) 2013  . Recurrent upper respiratory infection (URI)    FLU 08/23/11-08/29/11 then URI 08/29/11- 09/08/11- S/P zpack and steroids-  improved      no cough or congestion  . Thyroid disease   . 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 Ventral hernia     Past Surgical History:  Procedure Laterality Date  . APPENDECTOMY  2002   lap appy.  Dr. Hassell Done  . BREAST LUMPECTOMY WITH NEEDLE LOCALIZATION AND AXILLARY SENTINEL LYMPH NODE BX Left 08/04/2013   Procedure: BREAST LUMPECTOMY WITH NEEDLE LOCALIZATION AND AXILLARY SENTINEL LYMPH NODE Biopsy x 3;  Surgeon: Rolm Bookbinder, MD;  Location: [place];  Service: General;  Laterality: Left;  . BREAST SURGERY    . CARPAL TUNNEL RELEASE  10/2009-12/2010   left - right  . CESAREAN SECTION  X 2  . COLONOSCOPY    . HERNIA REPAIR  09/15/11   Ventral w/mesh  . JOINT REPLACEMENT    . KNEE ARTHROPLASTY Right 12/22/2015   Procedure: COMPUTER ASSISTED TOTAL KNEE ARTHROPLASTY;  Surgeon: Marybelle Killings, MD;  Location: [place];  Service: Orthopedics;  Laterality: Right;  . LAPAROSCOPIC CHOLECYSTECTOMY  2006   Dr. Bubba Camp  . TARSAL TUNNEL RELEASE Bilateral   . THYROIDECTOMY, PARTIAL    . VENTRAL HERNIA REPAIR  09/29/2011   Procedure: LAPAROSCOPIC VENTRAL HERNIA;  Surgeon: Adin Hector, MD;  [facility];  Service: General;  Laterality: N/A;  Laparoscopic Ventral Wall Hernia Repair with Mesh    There were no vitals filed for this visit.      Subjective  Assessment - 02/12/17 1448    Subjective I will be going back to work next week.  A little bit of low back pain after last session.     Currently in Pain? No/denies                         [facility] Adult PT Treatment/Exercise - 02/12/17 0001      Knee/Hip Exercises: Stretches   Gastroc Stretch --  3x 1 min on slant board     Knee/Hip Exercises: Aerobic   Recumbent Bike L3 with RPMs> 50 for 10  min     Knee/Hip Exercises: Machines for Strengthening   Total Gym Leg Press Seat 5 Bil  80# 3x10, RTLE 35# 3x 10     Knee/Hip Exercises: Standing   Step Down Right;2 sets;20 reps;Hand Hold: 2;Step Height: 6"   Stairs 4 stairs x 3 in between gastroc stretches   SLS with Vectors 10x  front, sideways, and backward with left foot on slider and right foot stationary.    Rebounder weight shifting 3 ways x 1 minute each   Walking with Sports Cord 25# 10x forward and back,& sidestepping x 10   going to the left is hardest     Knee/Hip Exercises: Seated   Long Arc Quad Strengthening;Right;3 sets;10 reps;Weights   Long Arc Quad Weight 5 lbs.                  PT Short Term Goals - 01/29/17 1537      PT SHORT TERM GOAL #1   Title be independent in initial HEP   Status Achieved     PT SHORT TERM GOAL #2   Title ability to ambulate for 20 minutes with no pain or knee immobilizer due to increased endurance   Status Achieved           PT Long Term Goals - 02/12/17 1451      PT LONG TERM GOAL #1   Title be independent in advanced HEP   Time 8   Period Weeks   Status On-going     PT LONG TERM GOAL #2   Baseline 55% limitation   Time 8   Period Weeks   Status On-going               Plan - 02/12/17 1452    Clinical Impression Statement Pt will return to work next week and D/C next session.  Pt continues to be challenged by steps.  Overall 80-90% improvement since the start of care.  Pt without edema.  Pt will benefit from 1 more session to work on functional strength and to finalize HEP.     Rehab Potential Excellent   Clinical Impairments Affecting Rehab Potential Right TKR 2017   PT Frequency 2x / week   PT Duration 8 weeks   PT Treatment/Interventions Iontophoresis 88m/ml Dexamethasone;Electrical Stimulation;Cryotherapy;Moist Heat;Gait training;Stair training;Patient/family education;Neuromuscular re-education;Therapeutic exercise;Therapeutic activities;Manual techniques;Passive range of motion;Taping;Vasopneumatic Device   PT Next Visit Plan g-codes, D/C PT, finalize HEP   Consulted and Agree with Plan of Care Patient      Patient will benefit from skilled therapeutic intervention in order to improve the following deficits and impairments:   Abnormal gait, Difficulty walking, Pain, Decreased strength, Decreased activity tolerance, Decreased endurance, Increased edema  Visit Diagnosis: Acute pain of right knee  Muscle weakness (generalized)  Stiffness of right knee, not elsewhere classified     Problem List Patient Active Problem List  Diagnosis Date Noted  . Status post total right knee replacement 12/22/2015  . Hepatic steatosis 02/09/2015  . Hot flashes 09/09/2014  . Osteopenia 09/09/2014  . Breast cancer of upper-inner quadrant of left female breast ([place]) 07/23/2013  . Memory loss 07/14/2013  . Headache 07/14/2013  . Other malaise and fatigue 07/14/2013  . Acute pulmonary embolism ([place]) 10/07/2011  . Leukocytosis 10/07/2011  . Thyroid mass, left inferior lobe, 3.6cm [id number] 10/07/2011  . Hyponatremia 10/07/2011  . Obesity (BMI 30-39.9) 08/07/2011  . Post concussion syndrome     Sigurd Sos, PT 02/12/17 3:16 PM  [facility] [address], [place], [place], [address] Phone: [phone]   Fax:  [phone]  Name: Alyssa [name] MRN: [id number] Date of Birth: August 02, 1950

## 2017-02-14 ENCOUNTER — Ambulatory Visit: Payer: Medicare Other

## 2017-02-14 DIAGNOSIS — M25561 Pain in right knee: Secondary | ICD-10-CM

## 2017-02-14 DIAGNOSIS — M25661 Stiffness of right knee, not elsewhere classified: Secondary | ICD-10-CM

## 2017-02-14 DIAGNOSIS — M6281 Muscle weakness (generalized): Secondary | ICD-10-CM

## 2017-02-14 NOTE — Therapy (Signed)
[facility] [address] [place], [place], [address] Phone: [phone]   Fax:  [phone]  Physical Therapy Treatment  Patient Details  Name: Alyssa [name] MRN: [id number] Date of Birth: [birth year] Referring Provider: Dr. Rodell Perna  Encounter Date: 02/14/2017      PT End of Session - 02/14/17 1516    Visit Number 13   PT Start Time 6063   PT Stop Time 1516   PT Time Calculation (min) 38 min   Activity Tolerance Patient tolerated treatment well   Behavior During Therapy [facility] for tasks assessed/performed      Past Medical History:  Diagnosis Date  . Abdominal pain   . Breast cancer ([place]) 06/2013   left upper inner  . Cancer ([place])    breast  . Diabetes mellitus without complication ([place])    Takes Tradjenta  . Family history of adverse reaction to anesthesia    Patients mother had to be resusciated after having anesthesia  . GERD (gastroesophageal reflux disease)   . Headache(784.0)    Takes Depakote  . Hernia, incisional periumbilical, incarcerated 0/16/0109  . Hot flashes   . Hx of radiation therapy 09/09/13- 10/13/13   left breast 5000 cGy in 25 sessions, no boost  . Hyperlipidemia   . Hypertension    PCP DR Nolon Rod  . Hypothyroidism   . Peripheral vascular disease ([place])    pulmonary embolis-still have some  . Post concussion syndrome    4 yrs ago- sees [facility]- Dr Leonie Man every 6 months-   has sleep study 2011 following accident but was neg per patient-  short term memory issues, dates and times  . Pulmonary embolism ([place]) 2013  . Recurrent upper respiratory infection (URI)    FLU 08/23/11-08/29/11 then URI 08/29/11- 09/08/11- S/P zpack and steroids-  improved      no cough or congestion  . Thyroid disease   . Thyroid mass, left inferior lobe, 3.6cm [id number] 10/07/2011  . Ventral hernia     Past Surgical History:  Procedure Laterality Date  . APPENDECTOMY  2002   lap appy.  Dr. Hassell Done  .  BREAST LUMPECTOMY WITH NEEDLE LOCALIZATION AND AXILLARY SENTINEL LYMPH NODE BX Left 08/04/2013   Procedure: BREAST LUMPECTOMY WITH NEEDLE LOCALIZATION AND AXILLARY SENTINEL LYMPH NODE Biopsy x 3;  Surgeon: Rolm Bookbinder, MD;  Location: [place];  Service: General;  Laterality: Left;  . BREAST SURGERY    . CARPAL TUNNEL RELEASE  10/2009-12/2010   left - right  . CESAREAN SECTION  X 2  . COLONOSCOPY    . HERNIA REPAIR  09/15/11   Ventral w/mesh  . JOINT REPLACEMENT    . KNEE ARTHROPLASTY Right 12/22/2015   Procedure: COMPUTER ASSISTED TOTAL KNEE ARTHROPLASTY;  Surgeon: Marybelle Killings, MD;  Location: [place];  Service: Orthopedics;  Laterality: Right;  . LAPAROSCOPIC CHOLECYSTECTOMY  2006   Dr. Bubba Camp  . TARSAL TUNNEL RELEASE Bilateral   . THYROIDECTOMY, PARTIAL    . VENTRAL HERNIA REPAIR  09/29/2011   Procedure: LAPAROSCOPIC VENTRAL HERNIA;  Surgeon: Adin Hector, MD;  [facility];  Service: General;  Laterality: N/A;  Laparoscopic Ventral Wall Hernia Repair with Mesh    There were no vitals filed for this visit.      Subjective Assessment - 02/14/17 1449    Subjective Ready to D/C to HEP and elliptical at home.  I still need to work on my endurance.     Currently in Pain?  No/denies            [facility] PT Assessment - 02/14/17 0001      Assessment   Medical Diagnosis M76.51 Patellar tendinitis of right knee     Observation/Other Assessments   Focus on Therapeutic Outcomes (FOTO)  55% limitation     Strength   Right/Left Knee Right   Right Knee Flexion 5/5   Right Knee Extension 4+/5                     [facility] Adult PT Treatment/Exercise - 02/14/17 0001      Knee/Hip Exercises: Aerobic   Recumbent Bike L3 with RPMs> 50 for 10  min     Knee/Hip Exercises: Machines for Strengthening   Total Gym Leg Press Seat 5 Bil  80# 3x10, RTLE 35# 3x 10     Knee/Hip Exercises: Standing   Step Down Right;2 sets;20 reps;Hand Hold: 2;Step Height: 6"   Stairs 4 stairs x 3 in  between gastroc stretches   SLS with Vectors 10x front, sideways, and backward with left foot on slider and right foot stationary.    Rebounder weight shifting 3 ways x 1 minute each   Walking with Sports Cord 25# 10x forward and back,& sidestepping x 10   going to the left is hardest     Knee/Hip Exercises: Seated   Long Arc Quad Strengthening;Right;3 sets;10 reps;Weights   Long Arc Quad Weight 5 lbs.                  PT Short Term Goals - 01/29/17 1537      PT SHORT TERM GOAL #1   Title be independent in initial HEP   Status Achieved     PT SHORT TERM GOAL #2   Title ability to ambulate for 20 minutes with no pain or knee immobilizer due to increased endurance   Status Achieved           PT Long Term Goals - 02/14/17 1450      PT LONG TERM GOAL #1   Title be independent in advanced HEP   Status Achieved     PT LONG TERM GOAL #2   Title reduce FOTO to < or = to 42% limitation   Baseline 55% limitation   Status Partially Met     PT LONG TERM GOAL #3   Title ascend steps with step-over-step with use of 1 rail   Baseline ascending with step-over-step, descending is still challenging so step to     PT LONG TERM GOAL #4   Title be able to get up and down from the floor to play with her grandchild due to right knee strength >/= 4+/5   Status Achieved     PT LONG TERM GOAL #5   Title imrprove strength and endurance to walk for 30 minutes in the community without need for rest   Status Achieved               Plan - 02/14/17 1501    Clinical Impression Statement Pt will D/C to HEP today.  Pt with improved Rt LE strength and able to get up off the floor.  Pt ascends steps with step-over-step gait and descends steps with step-to at times due to reduced eccentric strength.  Pt reports 80-90% overall improvement since the start of care.  Pt will continue to work on strength and flexibility exercises after D/C and follow-up with MD as needed.  Rehab  Potential Excellent   PT Frequency 2x / week   PT Duration 8 weeks   PT Treatment/Interventions Iontophoresis 52m/ml Dexamethasone;Electrical Stimulation;Cryotherapy;Moist Heat;Gait training;Stair training;Patient/family education;Neuromuscular re-education;Therapeutic exercise;Therapeutic activities;Manual techniques;Passive range of motion;Taping;Vasopneumatic Device   PT Next Visit Plan D/C PT to HEP   Consulted and Agree with Plan of Care Patient      Patient will benefit from skilled therapeutic intervention in order to improve the following deficits and impairments:  Abnormal gait, Difficulty walking, Pain, Decreased strength, Decreased activity tolerance, Decreased endurance, Increased edema  Visit Diagnosis: Acute pain of right knee  Muscle weakness (generalized)  Stiffness of right knee, not elsewhere classified       G-Codes - [id number]    Functional Assessment Tool Used (Outpatient Only) FOTO: 55% limitation   Functional Limitation Mobility: Walking and moving around   Mobility: Walking and Moving Around Goal Status [phone] At least 40 percent but less than 60 percent impaired, limited or restricted   Mobility: Walking and Moving Around Discharge Status [phone] At least 40 percent but less than 60 percent impaired, limited or restricted      Problem List Patient Active Problem List   Diagnosis Date Noted  . Status post total right knee replacement 12/22/2015  . Hepatic steatosis 02/09/2015  . Hot flashes 09/09/2014  . Osteopenia 09/09/2014  . Breast cancer of upper-inner quadrant of left female breast ([place] 07/23/2013  . Memory loss 07/14/2013  . Headache 07/14/2013  . Other malaise and fatigue 07/14/2013  . Acute pulmonary embolism ([place] 10/07/2011  . Leukocytosis 10/07/2011  . 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 Hyponatremia 10/07/2011  . Obesity (BMI 30-39.9) 08/07/2011  . Post concussion syndrome     PHYSICAL THERAPY  DISCHARGE SUMMARY  Visits from Start of Care: 13  Current functional level related to goals / functional outcomes: See above for current status.  Pt has HEP in place.     Remaining deficits: Pt reports endurance deficits and demonstrates reduced eccentric strength.     Education / Equipment: HEP Plan: Patient agrees to discharge.  Patient goals were partially met. Patient is being discharged due to being pleased with the current functional level.  ?????         KSigurd Sos PT [id number] PM  [facility] [address] [place] [address]Phone: [phone]  Fax:  [phone] Name: Alyssa DEVENEYMRN: [id number] of Birth: 525-Jul-1952

## 2017-02-16 IMAGING — US US ABDOMEN LIMITED
1 series · 14 of 25 positions shown · non-contrast
Comparison: 06/08/2005

CLINICAL DATA: Elevated liver function tests. Personal history of
breast carcinoma.

EXAM:
US ABDOMEN LIMITED - RIGHT UPPER QUADRANT

[Series 1: us abdomen limited · 0.24mm/px · 14 of 31 slices shown]
[im 1/31]
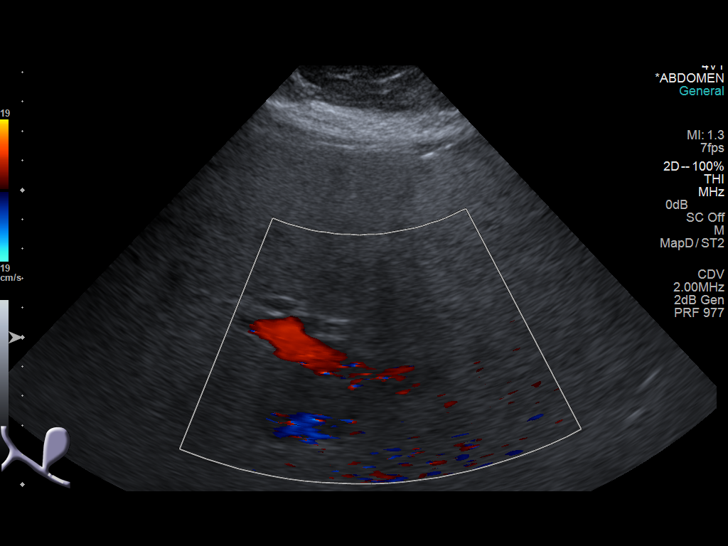
[im 3/31]
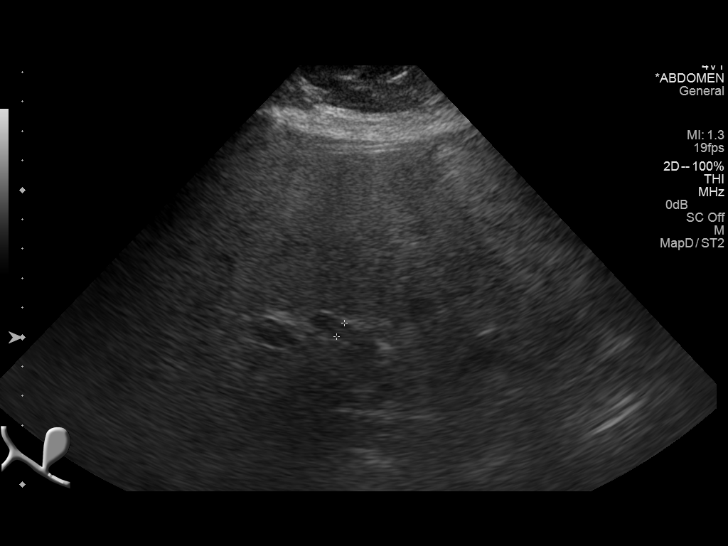
[im 6/31]
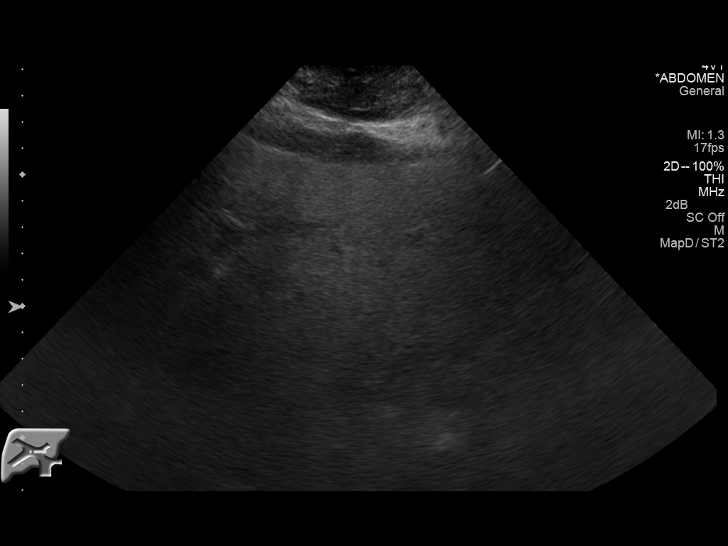
[im 8/31]
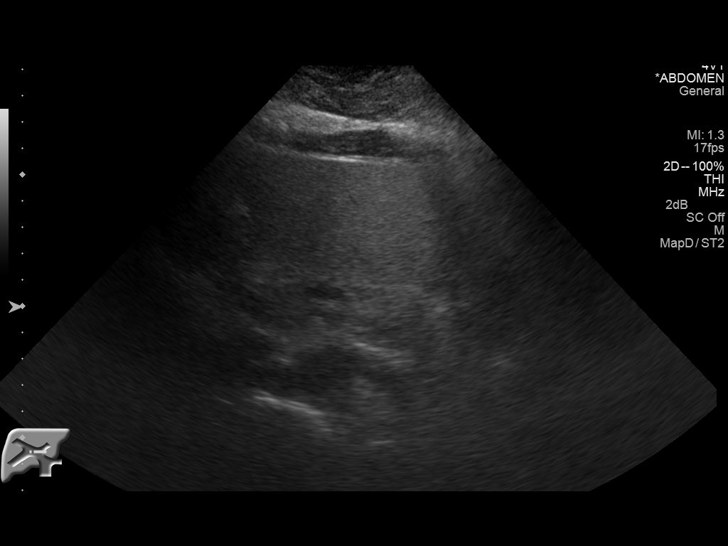
[im 11/31]
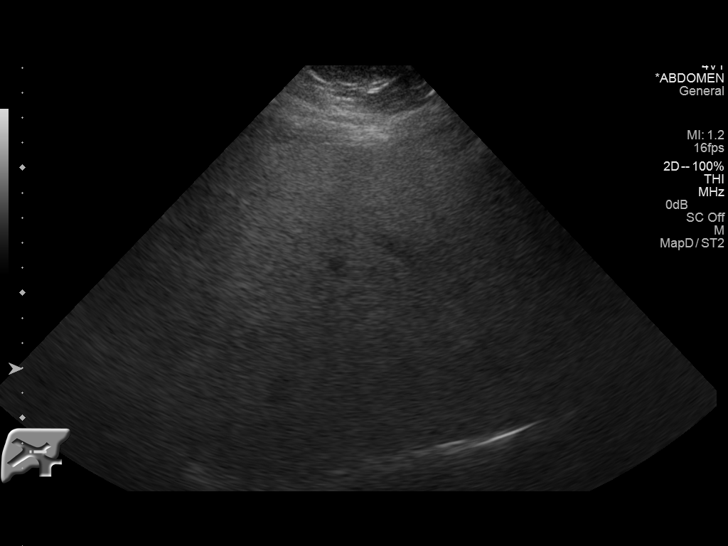
[im 12/31]
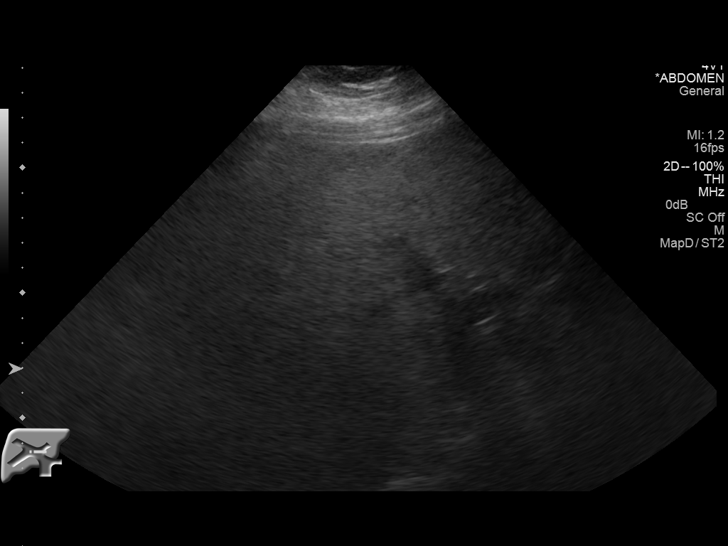
[im 14/31]
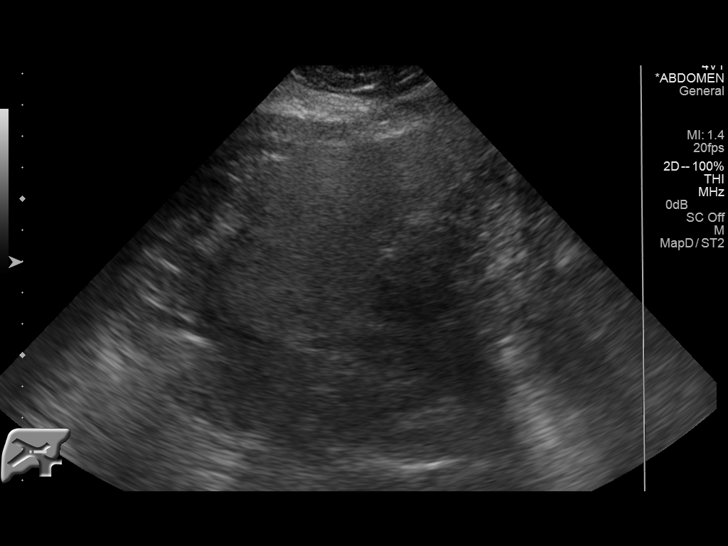
[im 17/31]
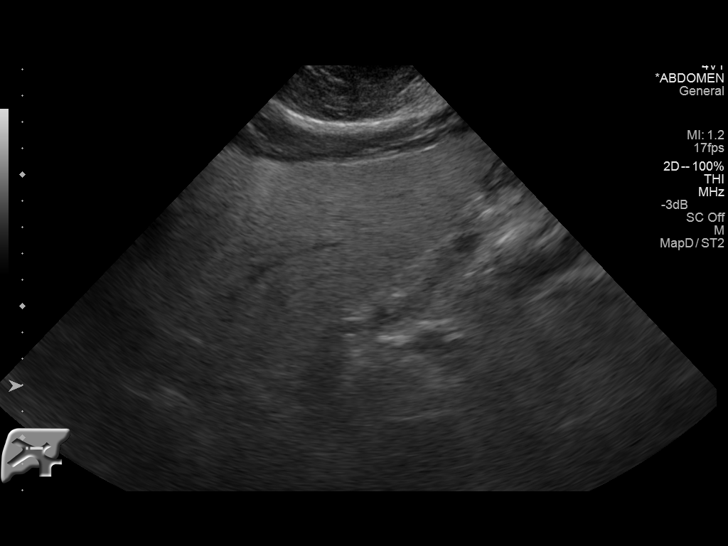
[im 19/31]
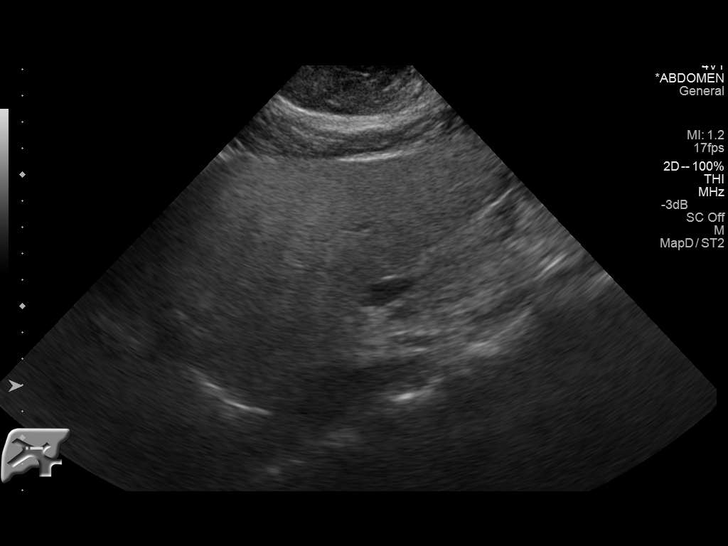
[im 21/31]
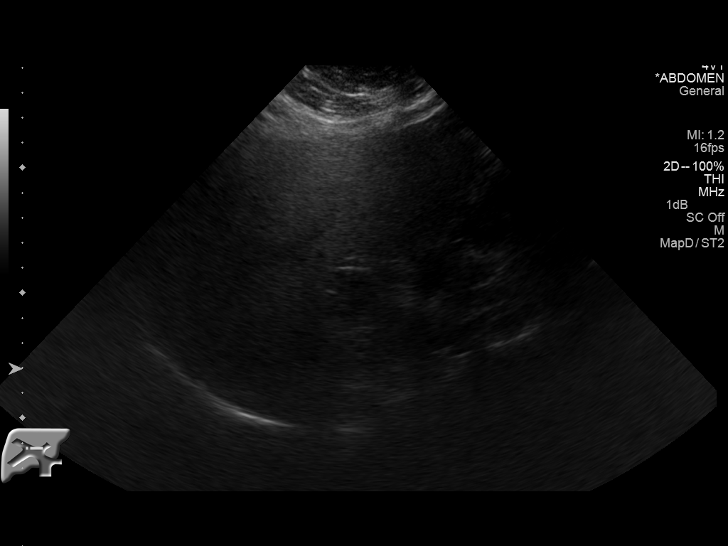
[im 23/31]
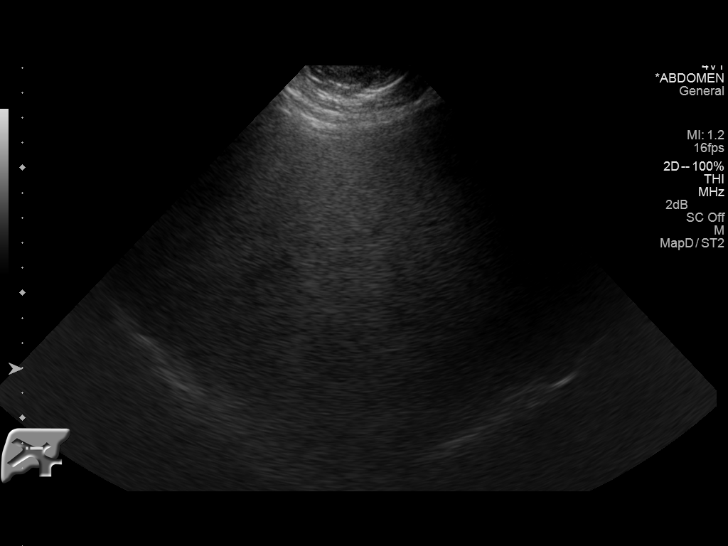
[im 26/31]
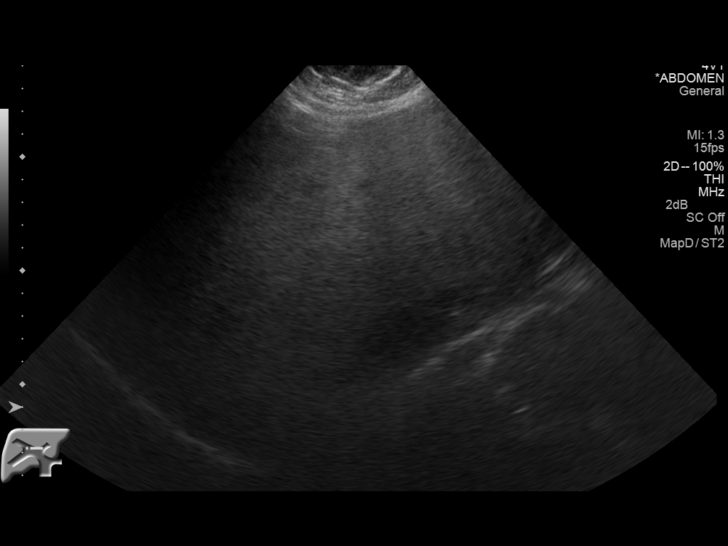
[im 28/31]
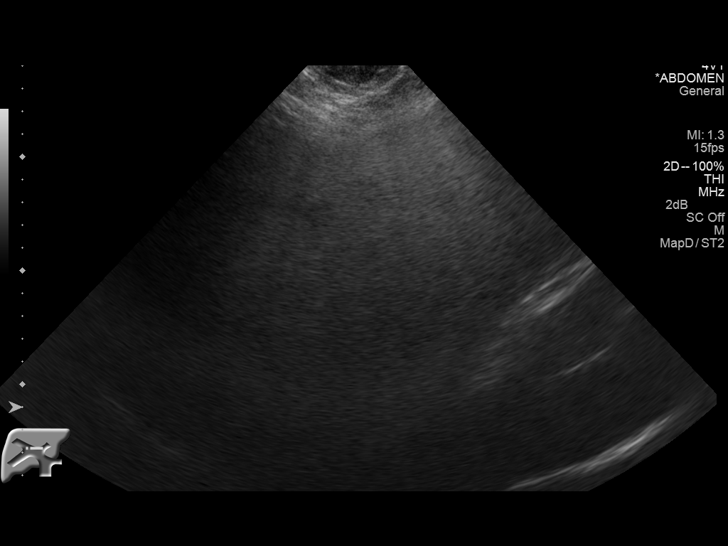
[im 31/31]
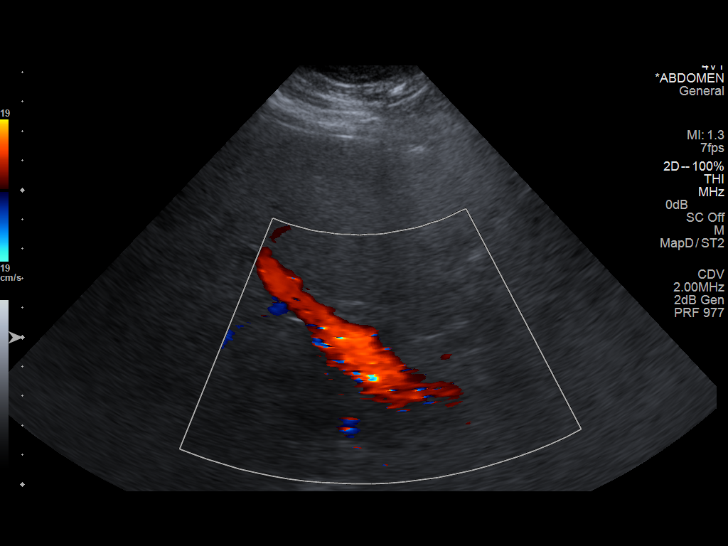

[14 of 25 positions shown; findings below may reference images not displayed]

FINDINGS: Gallbladder:

Surgically absent.

Common bile duct:

Diameter: 5 mm, which is within normal limits.

Liver:

Diffusely increased echogenicity of the hepatic parenchyma,
consistent with diffuse hepatic steatosis/hepatocellular disease. No
focal mass lesion identified.
IMPRESSION: Prior cholecystectomy.  No evidence of biliary ductal dilatation.

Hepatic steatosis/diffuse hepatocellular disease, which is increased
since previous study in 1559.

## 2017-02-22 ENCOUNTER — Other Ambulatory Visit: Payer: Self-pay | Admitting: Adult Health

## 2017-02-22 DIAGNOSIS — C50212 Malignant neoplasm of upper-inner quadrant of left female breast: Secondary | ICD-10-CM

## 2017-02-22 NOTE — Telephone Encounter (Signed)
Mendel Ryder,  This is a patient of Dr. Virgie Dad. Can you refill please?   Mike Craze, NP [place] [phone]

## 2017-03-01 ENCOUNTER — Encounter (INDEPENDENT_AMBULATORY_CARE_PROVIDER_SITE_OTHER): Payer: Self-pay | Admitting: Orthopaedic Surgery

## 2017-03-01 ENCOUNTER — Ambulatory Visit (INDEPENDENT_AMBULATORY_CARE_PROVIDER_SITE_OTHER): Payer: 59 | Admitting: Orthopaedic Surgery

## 2017-03-01 VITALS — BP 133/75 | HR 82 | Ht 62.0 in | Wt 215.0 lb

## 2017-03-01 DIAGNOSIS — M7651 Patellar tendinitis, right knee: Secondary | ICD-10-CM

## 2017-03-01 NOTE — Progress Notes (Signed)
Office Visit Note   Patient: Alyssa [name]           Date of Birth: 1951-01-23           MRN: [id number] Visit Date: 03/01/2017              Requested by: Stephens Shire, MD [address] [place], [place] [address] PCP: Stephens Shire, MD   Assessment & Plan: Visit Diagnoses:  1. Patellar tendinitis, right knee     Plan: Follow-up total knee arthroplasty was some postoperative patella tendinopathy while she was working hard in therapy. She is back at school walking up flights of stairs has excellent flexion good good extension of her knee doesn't have any knee pain. Opposite knee still bothers her to some degree and she like to defer left total knee arthroplasty total next summer.  Follow-Up Instructions: Return if symptoms worsen or fail to improve.   Orders:  No orders of the defined types were placed in this encounter.  No orders of the defined types were placed in this encounter.     Procedures: No procedures performed   Clinical Data: No additional findings.   Subjective: Chief Complaint  Patient presents with  . Right Knee - Pain, Injury    HPI 66 year old female returns post right total knee arthroplasty on 12/22/2015. Her pain medication. Her activity level significantly improved. He has flexion past 120 has full extension. She denies any swelling in her knee. Opposite left knee gives her some problems but she is trying to do some therapy strengthening exercises on the left leg as well.  Review of Systems 14 point systems up dated. Of note is the previous history of head concussion memory loss acute pulmonary embolism of left breast cancer bilateral knee osteoarthritis otherwise negative as it pertains to history of present illness. Objective: Vital Signs: BP 133/75   Pulse 82   Ht 5' 2"  (1.575 m)   Wt 215 lb (97.5 kg)   BMI 39.32 kg/m   Physical Exam  Constitutional: She is oriented to person, place, and time. She appears  well-developed.  HENT:  Head: Normocephalic.  Right Ear: External ear normal.  Left Ear: External ear normal.  Eyes: Pupils are equal, round, and reactive to light.  Neck: No tracheal deviation present. No thyromegaly present.  Cardiovascular: Normal rate.   Pulmonary/Chest: Effort normal.  Abdominal: Soft.  Musculoskeletal:  Patient has no knee effusion well-healed anterior incision no tenderness along the patellar tendon normal patellar tracking. Flexion to 20 no extension lag. Distal pulses are intact.  Neurological: She is alert and oriented to person, place, and time.  Skin: Skin is warm and dry.  Psychiatric: She has a normal mood and affect. Her behavior is normal.    Ortho Exam  Specialty Comments:  No specialty comments available.  Imaging: No results found.   PMFS History: Patient Active Problem List   Diagnosis Date Noted  . Patellar tendinitis, right knee 03/01/2017  . Status post total right knee replacement 12/22/2015  . Hepatic steatosis 02/09/2015  . Hot flashes 09/09/2014  . Osteopenia 09/09/2014  . Breast cancer of upper-inner quadrant of left female breast ([place]) 07/23/2013  . Memory loss 07/14/2013  . Headache 07/14/2013  . Other malaise and fatigue 07/14/2013  . Acute pulmonary embolism ([place]) 10/07/2011  . Leukocytosis 10/07/2011  . 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 Hyponatremia 10/07/2011  . Obesity (BMI 30-39.9) 08/07/2011  . Post  concussion syndrome    Past Medical History:  Diagnosis Date  . Abdominal pain   . Breast cancer ([place]) 06/2013   left upper inner  . Cancer ([place])    breast  . Diabetes mellitus without complication ([place])    Takes Tradjenta  . Family history of adverse reaction to anesthesia    Patients mother had to be resusciated after having anesthesia  . GERD (gastroesophageal reflux disease)   . Headache(784.0)    Takes Depakote  . Hernia, incisional periumbilical, incarcerated 01/19/6380  .  Hot flashes   . Hx of radiation therapy 09/09/13- 10/13/13   left breast 5000 cGy in 25 sessions, no boost  . Hyperlipidemia   . Hypertension    PCP DR Nolon Rod  . Hypothyroidism   . Peripheral vascular disease ([place])    pulmonary embolis-still have some  . Post concussion syndrome    4 yrs ago- sees [facility]- Dr Leonie Man every 6 months-   has sleep study 2011 following accident but was neg per patient-  short term memory issues, dates and times  . Pulmonary embolism ([place]) 2013  . Recurrent upper respiratory infection (URI)    FLU 08/23/11-08/29/11 then URI 08/29/11- 09/08/11- S/P zpack and steroids-  improved      no cough or congestion  . Thyroid disease   . 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 Ventral hernia     Family History  Problem Relation Age of Onset  . Malignant hyperthermia Mother   . Heart attack Father   . Stroke Father   . Diabetes Unknown        Grandmother  . Other Unknown        respiratory- Grandmother  . Cancer Maternal Grandmother 45       breast    Past Surgical History:  Procedure Laterality Date  . APPENDECTOMY  2002   lap appy.  Dr. Hassell Done  . BREAST LUMPECTOMY WITH NEEDLE LOCALIZATION AND AXILLARY SENTINEL LYMPH NODE BX Left 08/04/2013   Procedure: BREAST LUMPECTOMY WITH NEEDLE LOCALIZATION AND AXILLARY SENTINEL LYMPH NODE Biopsy x 3;  Surgeon: Rolm Bookbinder, MD;  Location: [facility];  Service: General;  Laterality: Left;  . BREAST SURGERY    . CARPAL TUNNEL RELEASE  10/2009-12/2010   left - right  . CESAREAN SECTION  X 2  . COLONOSCOPY    . HERNIA REPAIR  09/15/11   Ventral w/mesh  . JOINT REPLACEMENT    . KNEE ARTHROPLASTY Right 12/22/2015   Procedure: COMPUTER ASSISTED TOTAL KNEE ARTHROPLASTY;  Surgeon: Marybelle Killings, MD;  Location: [facility];  Service: Orthopedics;  Laterality: Right;  . LAPAROSCOPIC CHOLECYSTECTOMY  2006   Dr. Bubba Camp  . TARSAL TUNNEL RELEASE Bilateral   . THYROIDECTOMY, PARTIAL    . VENTRAL HERNIA REPAIR   09/29/2011   Procedure: LAPAROSCOPIC VENTRAL HERNIA;  Surgeon: Adin Hector, MD;  [facility];  Service: General;  Laterality: N/A;  Laparoscopic Ventral Wall Hernia Repair with Mesh   Social History   Occupational History  .  [place] History Main Topics  . Smoking status: Never Smoker  . Smokeless tobacco: Never Used  . Alcohol use No  . Drug use: No  . Sexual activity: Yes     Comment: Menarche age 45, first live birth age 60,  [place] P2. She stopped having periods approximately 1999 and took HRT until 2005.

## 2017-03-27 ENCOUNTER — Ambulatory Visit
Admission: RE | Admit: 2017-03-27 | Discharge: 2017-03-27 | Disposition: A | Discharge status: Home / Self Care | Payer: Medicare Other | Source: Ambulatory Visit | Attending: Family Medicine | Admitting: Family Medicine

## 2017-03-27 ENCOUNTER — Other Ambulatory Visit: Payer: Self-pay | Admitting: Family Medicine

## 2017-03-27 DIAGNOSIS — R0600 Dyspnea, unspecified: Secondary | ICD-10-CM

## 2017-04-30 ENCOUNTER — Encounter: Payer: Self-pay | Admitting: Pulmonary Disease

## 2017-04-30 ENCOUNTER — Ambulatory Visit (INDEPENDENT_AMBULATORY_CARE_PROVIDER_SITE_OTHER): Payer: Medicare Other | Admitting: Pulmonary Disease

## 2017-04-30 VITALS — BP 130/88 | HR 91 | Ht 67.0 in | Wt 219.0 lb

## 2017-04-30 DIAGNOSIS — R05 Cough: Secondary | ICD-10-CM | POA: Diagnosis not present

## 2017-04-30 DIAGNOSIS — R053 Chronic cough: Secondary | ICD-10-CM

## 2017-04-30 LAB — POCT EXHALED NITRIC OXIDE: FENO LEVEL (PPB): 14

## 2017-04-30 MED ORDER — FLUTICASONE PROPIONATE HFA 44 MCG/ACT IN AERO: 2.0000 | INHALATION_SPRAY | Freq: Two times a day (BID) | RESPIRATORY_TRACT | Status: DC

## 2017-04-30 MED ORDER — CETIRIZINE HCL 10 MG PO TABS: 10.0000 mg | ORAL_TABLET | Freq: Every day | ORAL | Status: DC

## 2017-04-30 MED ORDER — OMEPRAZOLE 40 MG PO CPDR: 40.0000 mg | DELAYED_RELEASE_CAPSULE | Freq: Every day | ORAL | 1 refills | Status: DC

## 2017-04-30 MED ORDER — MONTELUKAST SODIUM 10 MG PO TABS: 10.0000 mg | ORAL_TABLET | Freq: Every day | ORAL | 5 refills | Status: DC

## 2017-04-30 MED ORDER — CHLORPHENIRAMINE MALEATE 4 MG PO TABS: 4.0000 mg | ORAL_TABLET | Freq: Every day | ORAL | 0 refills | Status: DC

## 2017-04-30 NOTE — Progress Notes (Signed)
   Subjective:    Patient ID: Alyssa [name], female    DOB: 09/01/1950, 66 y.o.   MRN: [id number]  HPI    Review of Systems  Constitutional: Negative for fever and unexpected weight change.  HENT: Positive for congestion. Negative for dental problem, ear pain, nosebleeds, postnasal drip, rhinorrhea, sinus pressure, sneezing, sore throat and trouble swallowing.   Eyes: Negative for redness and itching.  Respiratory: Positive for cough, chest tightness, shortness of breath and wheezing.   Cardiovascular: Negative for palpitations and leg swelling.  Gastrointestinal: Positive for blood in stool. Negative for nausea and vomiting.  Genitourinary: Negative for dysuria.  Musculoskeletal: Negative for joint swelling.  Skin: Negative for rash.  Allergic/Immunologic: Positive for environmental allergies. Negative for food allergies and immunocompromised state.  Neurological: Negative for headaches.  Hematological: Does not bruise/bleed easily.  Psychiatric/Behavioral: Negative for dysphoric mood. The patient is not nervous/anxious.        Objective:   Physical Exam        Assessment & Plan:

## 2017-04-30 NOTE — Patient Instructions (Addendum)
Nasal irrigation (saline nasal spray) daily Flonase two times daily Zyrtec 10 mg pill daily Montelukast (singulair) 10 mg pill nightly Chlorpheniramine 4 mg pill nightly Flovent two puffs twice per day, and rinse mouth after each use Proair two puffs every 6 hours as needed for cough, wheeze, or chest congestion Omeprazole (prilosec) 40 mg pill daily, and take take 30 minutes before first meal of the day Sip water when you have urge to cough Use sugarless candy to keep your mouth moist Salt water gargles once or twice per day Allow your voice to rest as much as able  Follow up in 4 weeks

## 2017-04-30 NOTE — Progress Notes (Signed)
Past surgical history She  has a past surgical history that includes Appendectomy (2002); Carpal tunnel release (10/2009-12/2010); Tarsal tunnel release (Bilateral); Cesarean section (X 2); Ventral hernia repair (09/29/2011); Breast lumpectomy with needle localization and axillary sentinel lymph node bx (Left, 08/04/2013); Colonoscopy; Hernia repair (09/15/11); Thyroidectomy, partial; Knee Arthroplasty (Right, 12/22/2015); Joint replacement; Breast surgery; and Laparoscopic cholecystectomy (2006).  Family history Her family history includes Cancer (age of onset: 63) in her maternal grandmother; Diabetes in her unknown relative; Heart attack in her father; Malignant hyperthermia in her mother; Other in her unknown relative; Stroke in her father.  Social history She  reports that she has never smoked. She has never used smokeless tobacco. She reports that she does not drink alcohol or use drugs.  Allergies  Allergen Reactions  . Aleve [Naproxen Sodium] Hives, Itching and Swelling  . Aromasin [Exemestane] Anaphylaxis, Itching and Swelling    Pt currently tolerating medication well (02/03/16)-gwd; Pt stated "I can take this medication for a few months at a time and I take a benadryl when I feel it starting to come on" Patient has facial swelling, itching, and throat swelling  . Letrozole Shortness Of Breath, Rash and Other (See Comments)    Swelling - feet, eyes,hands;   Speech garbled  . Levofloxacin Rash and Swelling    Facial swelling and red facial rash  . Penicillins Hives, Swelling and Rash    All over the body. Has patient had a PCN reaction causing immediate rash, facial/tongue/throat swelling, SOB or lightheadedness with hypotension: Yes Has patient had a PCN reaction causing severe rash involving mucus membranes or skin necrosis: No Has patient had a PCN reaction that required hospitalization No Has patient had a PCN reaction occurring within the last 10 years: No If all of the above answers  are "NO", then may proceed with Cephalosporin use.   . Symbicort [Budesonide-Formoterol Fumarate] Anaphylaxis, Hives, Swelling and Rash  . Codeine Other (See Comments)    hallucinations  . Other Swelling    Swelling of  Eyes   Wheat ,oranges banana, zucchini,cayenne,chili  Peppers, sweet potatoes, pumpkin  . Percocet [Oxycodone-Acetaminophen] Other (See Comments)    Causes syncope day after medication has been taken.  . Vicodin [Hydrocodone-Acetaminophen] Other (See Comments)    Causes syncope day after medication has been taken.  . Prednisone Other (See Comments)    Unknown  . Cefdinir Rash and Swelling   Review of Systems  Constitutional: Negative for fever and unexpected weight change.  HENT: Positive for congestion. Negative for dental problem, ear pain, nosebleeds, postnasal drip, rhinorrhea, sinus pressure, sneezing, sore throat and trouble swallowing.   Eyes: Negative for redness and itching.  Respiratory: Positive for cough, chest tightness, shortness of breath and wheezing.   Cardiovascular: Negative for palpitations and leg swelling.  Gastrointestinal: Positive for blood in stool. Negative for nausea and vomiting.  Genitourinary: Negative for dysuria.  Musculoskeletal: Negative for joint swelling.  Skin: Negative for rash.  Allergic/Immunologic: Positive for environmental allergies. Negative for food allergies and immunocompromised state.  Neurological: Negative for headaches.  Hematological: Does not bruise/bleed easily.  Psychiatric/Behavioral: Negative for dysphoric mood. The patient is not nervous/anxious.    Current Outpatient Prescriptions on File Prior to Visit  Medication Sig  . ALPRAZolam (XANAX) 0.5 MG tablet Take 0.5 mg by mouth at bedtime as needed for anxiety or sleep.   Alyssa [name] THYROID 90 MG tablet TK 1 T PO QAM ON AN EMPTY STOMACH AND 1/2 T BEFORE LUNCH  . divalproex (  DEPAKOTE) 500 MG DR tablet Take 1 tablet (500 mg total) by mouth daily.  Marland Kitchen exemestane  (AROMASIN) 25 MG tablet TAKE 1 TABLET(25 MG) BY MOUTH DAILY AFTER BREAKFAST  . fluticasone (FLONASE) 50 MCG/ACT nasal spray Reported on 02/03/2016  . hydrochlorothiazide (HYDRODIURIL) 25 MG tablet Take 25 mg by mouth at bedtime.   Marland Kitchen linagliptin (TRADJENTA) 5 MG TABS tablet Take 5 mg by mouth daily.  Alyssa [name] DELICA LANCETS FINE MISC   . glucose blood (ONETOUCH VERIO) test strip    No current facility-administered medications on file prior to visit.     Chief Complaint  Patient presents with  . pulm consult    Pt referred by Dr. Terrill Mohr MD. Pt has chronic cough, horseness, sore throat, chest tightness, and ribs are sore. 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 Pt denies coughing up anything at this time. Cough has been since Aug 2018, there is pain in pt's lower back. Pt tried symbicort and had allergic reaction.    Pulmonary tests CT angio chest 10/07/11 >> large PE  FeNO 04/30/17 >> 14  Past medical history She  has a past medical history of Abdominal pain; Breast cancer ([place]) (06/2013); Cancer ([place]); Diabetes mellitus without complication ([place]); Family history of adverse reaction to anesthesia; GERD (gastroesophageal reflux disease); Headache(784.0); Hernia, incisional periumbilical, incarcerated (08/07/2011); Hot flashes; radiation therapy (09/09/13- 10/13/13); Hyperlipidemia; Hypertension; Hypothyroidism; Peripheral vascular disease ([place]); Post concussion syndrome; Pulmonary embolism ([place]) (2013); Recurrent upper respiratory infection (URI); Thyroid disease; Thyroid mass, left inferior lobe, 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  Vital signs BP 130/88 (BP Location: Left Arm, Cuff Size: Normal)   Pulse 91   Ht 5' 7"  (1.702 m)   Wt 219 lb (99.3 kg)   SpO2 96%   BMI 34.30 kg/m   History of present illness Alyssa [name] is a 66 y.o. female never smoker with cough.  She developed a cough in August.  She never really had a problem with this  before.  She has been getting a deep cough.  She also hears herself wheezing, especially at night.  She is not noticed sinus congestion, post nasal drip, ear pain, eye irritation, or sore throat.  She denies heart burn or reflux.  No difficulty with swallowing.  Never had allergy testing before.  She uses flonase bid.  She tried symbicort but had an allergic reaction.  She can't take oral prednisone >> gets rash.  She can take IV or IM steroids w/o reaction.  She is not aware of food allergies, or reaction to aspirin.  She does have several other medication allergies.  She takes zyrtec nightly.  She was given steroid shot and antibiotic recently.  Didn't seem to help much.  She has been using albuterol inhaler.  Doesn't help much.  She is from [place].  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  No recent travel or sick exposures.  No recent change to medications.  She had a pulmonary embolism about 5 yrs ago.  She is followed by Dr. Jana Hakim for history of breast cancer.   Physical exam  General - No distress Eyes - pupils reactive ENT - Raspy voice, no sinus tenderness, clear nasal drainage, mild cerumen build up b/l, TM clear, no oral exudate, scalloped tongue, MP 3 Cardiac - s1s2 regular, no murmur, pulses symmetric Chest - No wheeze/rales/dullness, good air entry, normal respiratory excursion Back - No focal tenderness Abd -  Soft, non-tender, no organomegaly, + bowel sounds Ext - No edema Neuro - Normal strength, cranial nerves intact Skin - No rashes Psych - Normal mood, and behavior   CMP Latest Ref Rng & Units 07/27/2016 12/23/2015 12/10/2015  Glucose 70 - 140 mg/dl 85 118(H) 91  BUN 7.0 - 26.0 mg/dL 15.4 8 13   Creatinine 0.6 - 1.1 mg/dL 0.8 0.86 0.87  Sodium 136 - 145 mEq/L 140 137 141  Potassium 3.5 - 5.1 mEq/L 3.4(L) 4.1 3.4(L)  Chloride 101 - 111 mmol/L - 102 97(L)  CO2 22 - 29 mEq/L 29 27 32  Calcium 8.4 - 10.4 mg/dL 9.8 8.2(L) 10.3  Total Protein 6.4 - 8.3 g/dL 7.4  - 7.1  Total Bilirubin 0.20 - 1.20 mg/dL 0.75 - 1.0  Alkaline Phos 40 - 150 U/L 59 - 57  AST 5 - 34 U/L 21 - 39  ALT 0 - 55 U/L 27 - 50     CBC Latest Ref Rng & Units 07/27/2016 12/25/2015 12/24/2015  WBC 3.9 - 10.3 10e3/uL 5.3 8.6 8.2  Hemoglobin 11.6 - 15.9 g/dL 13.9 11.7(L) 11.1(L)  Hematocrit 34.8 - 46.6 % 41.9 38.4 36.5  Platelets 145 - 400 10e3/uL 238 216 182    CXR 03/27/17 >> normal   Discussion She has chronic cough since August 2018.  She never smoked, and had normal recent chest xray.  In this situation about 90% of time cough is related to 1) post nasal drip, 2) asthma, or 3) reflux.  Will attempt to treat for all 3 to begin with.  If she does not improve, then will need additional lab/chest imaging/pulmonary function testing.  Assessment/plan  Upper airway cough syndrome with post nasal drip. - nasal irrigation, flonase, zyrtec, singulair, chlorpheniramine  Cough variant asthma. - flovent, proair, singuliar  Laryngopharyngeal reflux. - prilosec 30 minutes before first meal of the day  Chronic cough. - sip water  - sugarless candy to keep mouth moist - salt water gargles - voice rest    Patient Instructions  Nasal irrigation (saline nasal spray) daily Flonase two times daily Zyrtec 10 mg pill daily Montelukast (singulair) 10 mg pill nightly Chlorpheniramine 4 mg pill nightly Flovent two puffs twice per day, and rinse mouth after each use Proair two puffs every 6 hours as needed for cough, wheeze, or chest congestion Omeprazole (prilosec) 40 mg pill daily, and take take 30 minutes before first meal of the day Sip water when you have urge to cough Use sugarless candy to keep your mouth moist Salt water gargles once or twice per day Allow your voice to rest as much as able  Follow up in 4 weeks    Chesley Mires, MD [place] [facility] Pager:  [phone] 04/30/2017, 10:45 AM

## 2017-05-01 ENCOUNTER — Telehealth: Payer: Self-pay | Admitting: Pulmonary Disease

## 2017-05-01 MED ORDER — FLUTICASONE PROPIONATE HFA 44 MCG/ACT IN AERO: 2.0000 | INHALATION_SPRAY | Freq: Two times a day (BID) | RESPIRATORY_TRACT | 12 refills | Status: DC

## 2017-05-01 NOTE — Telephone Encounter (Signed)
Pt states that chlor-trimeton and flovent were not at pharmacy.  I advised that chlor-trimeton was an otc med, but that the flovent was not.  I resent flovent to preferred pharmacy.  Nothing further needed.

## 2017-06-01 ENCOUNTER — Ambulatory Visit: Payer: Medicare Other | Admitting: Pulmonary Disease

## 2017-06-01 ENCOUNTER — Encounter: Payer: Self-pay | Admitting: Pulmonary Disease

## 2017-06-01 VITALS — BP 112/74 | HR 76 | Ht 62.0 in | Wt 218.0 lb

## 2017-06-01 DIAGNOSIS — J4530 Mild persistent asthma, uncomplicated: Secondary | ICD-10-CM

## 2017-06-01 DIAGNOSIS — R05 Cough: Secondary | ICD-10-CM

## 2017-06-01 DIAGNOSIS — R058 Other specified cough: Secondary | ICD-10-CM

## 2017-06-01 DIAGNOSIS — R053 Chronic cough: Secondary | ICD-10-CM

## 2017-06-01 NOTE — Progress Notes (Signed)
Current Outpatient Medications on File Prior to Visit  Medication Sig  . ALPRAZolam (XANAX) 0.5 MG tablet Take 0.5 mg by mouth at bedtime as needed for anxiety or sleep.   Francia Greaves THYROID 90 MG tablet TK 1 T PO QAM ON AN EMPTY STOMACH AND 1/2 T BEFORE LUNCH  . cetirizine (ZYRTEC ALLERGY) 10 MG tablet Take 1 tablet (10 mg total) by mouth daily.  . divalproex (DEPAKOTE) 500 MG DR tablet Take 1 tablet (500 mg total) by mouth daily.  Marland Kitchen exemestane (AROMASIN) 25 MG tablet TAKE 1 TABLET(25 MG) BY MOUTH DAILY AFTER BREAKFAST  . fluticasone (FLONASE) 50 MCG/ACT nasal spray Reported on 02/03/2016  . fluticasone (FLOVENT HFA) 44 MCG/ACT inhaler Inhale 2 puffs into the lungs 2 (two) times daily.  Marland Kitchen glucose blood (ONETOUCH VERIO) test strip   . hydrochlorothiazide (HYDRODIURIL) 25 MG tablet Take 25 mg by mouth at bedtime.   Marland Kitchen linagliptin (TRADJENTA) 5 MG TABS tablet Take 5 mg by mouth daily.  . montelukast (SINGULAIR) 10 MG tablet Take 1 tablet (10 mg total) by mouth at bedtime.  Alyssa [name] DELICA LANCETS FINE MISC    Current Facility-Administered Medications on File Prior to Visit  Medication  . fluticasone (FLOVENT HFA) 44 MCG/ACT inhaler 2 puff     Chief Complaint  Patient presents with  . Follow-up    Pt is much better since last visit. Pt's voice is better, and cough is not as bad.    Pulmonary tests CT angio chest 10/07/11 >> large PE  FeNO 04/30/17 >> 14  Past medical history Breast cancer 2014, DM, GERD, HA, HLD, HTN, Hypothyroidism, PAD  Past surgical history, Family history, Social history, Allergies all reviewed.  Vital Signs BP 112/74 (BP Location: Left Arm, Cuff Size: Normal)   Pulse 76   Ht 5' 2"  (1.575 m)   Wt 218 lb (98.9 kg)   SpO2 93%   BMI 39.87 kg/m   History of Present Illness Alyssa [name] is a 66 y.o. female with chronic cough 2nd to asthma and post nasal drip.  She is doing much better.  She can take deep breaths.  She is not having sinus congestion  Not  having wheeze.  Only has sporadic cough.  No wheezing.  She is able to sing again.  She isn't taking chlorpheniramine any more.  Not sure prilosec has helped.   Physical Exam  General - No distress ENT - No sinus tenderness, no oral exudate, no LAN, raspy voice Cardiac - s1s2 regular, no murmur Chest - No wheeze/rales/dullness Back - No focal tenderness Abd - Soft, non-tender Ext - No edema Neuro - Normal strength Skin - No rashes Psych - normal mood, and behavior   Assessment/Plan  Persistent asthma. - continue flovent, singulair, and prn albuterol  Upper airway cough syndrome. - continue flonase, singulair, and zyrtec  Chronic cough. - she can stop prilosec and monitor symptoms   Patient Instructions  Can stop prilosec  Follow up in 4 months   Chesley Mires, MD [place] [facility]/Sleep Pager:  [phone] 06/01/2017, 4:17 PM

## 2017-06-01 NOTE — Patient Instructions (Signed)
Can stop prilosec  Follow up in 4 months

## 2017-06-04 ENCOUNTER — Other Ambulatory Visit: Payer: Self-pay | Admitting: Oncology

## 2017-06-04 ENCOUNTER — Other Ambulatory Visit: Payer: Self-pay | Admitting: Adult Health

## 2017-06-04 DIAGNOSIS — Z853 Personal history of malignant neoplasm of breast: Secondary | ICD-10-CM

## 2017-06-05 ENCOUNTER — Other Ambulatory Visit: Payer: Self-pay | Admitting: Oncology

## 2017-06-11 ENCOUNTER — Telehealth: Payer: Self-pay | Admitting: Oncology

## 2017-06-11 NOTE — Telephone Encounter (Signed)
Called patient regarding new appointment time I will mail a letter.

## 2017-06-19 ENCOUNTER — Other Ambulatory Visit: Payer: Self-pay | Admitting: Pulmonary Disease

## 2017-07-04 ENCOUNTER — Other Ambulatory Visit: Payer: Self-pay | Admitting: Pulmonary Disease

## 2017-07-11 NOTE — Progress Notes (Signed)
[facility]  PATIENT: Alyssa [name] DOB: 12/11/1950   REASON FOR VISIT: Follow-up for memory loss, post concussion syndrome, headaches HISTORY FROM: Patient    HISTORY OF PRESENT ILLNESS: Update 12/20/2018[name], 66 year old female returns for follow-up with history of headaches and postconcussion syndrome and memory loss.  For her headaches she is taking Depakote but decided to taper herself off of the medication back in the summer, her headaches returned and she resumed the Depakote.  Her headaches are now in good control her memory loss is stable per MMSE.  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  She has not had no issues performing her job.  She returns for reevaluation and refills   UPDATE 07/19/16 Alyssa [name], 66 year old female returns for yearly followup.  Patient fell off of the chair at school back in June2015 and hit her head. CT of the head at that time without abnormalities her headaches returned and she started taking her Depakote again with good benefit. Her memory is stable per MMSE. 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  She has not had any issues performing  her job. She had breast cancer surgery January 2015. She had knee replacement on the right 12/22/2015 She returns for reevaluation and refills   HISTORY: of cognitive changes post head injury. She was initially evaluated by Dr. Leonie Man in September of 2009. She was operating a push mower when she was going down a ditch in front of her home and toppled over the handle and hit her head on the motor of the mower. At that time she was not sure she loss consciousness or not,she was able to get up go indoors. She did not seek medical help that day, however she did see her primary care the next day when she was complaining of severe headache, appeared to be confused and  disoriented and did not recognize the Dr. CT of the head showed no acute abnormality. She has since had an MRI of the brain which was read as normal as well as an EEG that was normal. She returns today continuing to complain with short-term memory, attention, and concentration difficulties but she continues to work and she has not had any difficulty performing her job duties. She says she manages by using sticky notes, so that she will not forget things. She denies any depression ,she is trying to exercise and has lost weight since last seen. She does complain of early morning headaches and fatigue, does not feel rested after sleeping although that is better with Trazodone. She is currently on Depakote 500 daily 2 daily for headaches.Her headaches have returned since she is back to teaching after being off for the summer. She says these are stress related. She does not want to increase medication. There is no nausea, photophobia, with the headaches. 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 Fell several weeks ago and hit the back of the head. No obvious injury. See ROS  07/18/12: Returns for followup. MMSE stable. Went off Depakote for a few weeks and headaches returned and mood was terrible. Her PCP gave her a RX to get back on the Depakote. Headaches are stable at present. Continues to work with out problems with performing job. Had hernia repair in March and complication of PE after surgery. Has just gotten off coumadin. No new complaints  REVIEW OF SYSTEMS: Full 14 system review of systems performed and notable only for those listed, all others are neg:  Constitutional: neg  Cardiovascular: neg Ear/Nose/Throat: neg  Skin: neg Eyes: neg Respiratory: neg Gastroitestinal: neg  Hematology/Lymphatic: neg  Endocrine: neg Musculoskeletal:neg Allergy/Immunology: neg Neurological: Memory loss, headaches Psychiatric: neg Sleep : neg   ALLERGIES: Allergies  Allergen Reactions   . Aleve [Naproxen Sodium] Hives, Itching and Swelling  . Aromasin [Exemestane] Anaphylaxis, Itching and Swelling    Pt currently tolerating medication well (02/03/16)-gwd; Pt stated "I can take this medication for a few months at a time and I take a benadryl when I feel it starting to come on" Patient has facial swelling, itching, and throat swelling  07/12/17  Taking this right now and no problem. sy/rn  . Letrozole Shortness Of Breath, Rash and Other (See Comments)    Swelling - feet, eyes,hands;   Speech garbled  . Levofloxacin Rash and Swelling    Facial swelling and red facial rash  . Penicillins Hives, Swelling and Rash    All over the body. Has patient had a PCN reaction causing immediate rash, facial/tongue/throat swelling, SOB or lightheadedness with hypotension: Yes Has patient had a PCN reaction causing severe rash involving mucus membranes or skin necrosis: No Has patient had a PCN reaction that required hospitalization No Has patient had a PCN reaction occurring within the last 10 years: No If all of the above answers are "NO", then may proceed with Cephalosporin use.   . Symbicort [Budesonide-Formoterol Fumarate] Anaphylaxis, Hives, Swelling and Rash  . Codeine Other (See Comments)    hallucinations  . Other Swelling    Swelling of  Eyes   Wheat ,oranges banana, zucchini,cayenne,chili  Peppers, sweet potatoes, pumpkin  07/12/17 no problem at this time.   Marland Kitchen Percocet [Oxycodone-Acetaminophen] Other (See Comments)    Causes syncope day after medication has been taken.  . Vicodin [Hydrocodone-Acetaminophen] Other (See Comments)    Causes syncope day after medication has been taken.  . Prednisone Other (See Comments)    Unknown  . Cefdinir Rash and Swelling    HOME MEDICATIONS: Outpatient Medications Prior to Visit  Medication Sig Dispense Refill  . ALPRAZolam (XANAX) 0.5 MG tablet Take 0.5 mg by mouth at bedtime as needed for anxiety or sleep.     Francia Greaves THYROID 90 MG  tablet TK 1 T PO QAM ON AN EMPTY STOMACH AND 1/2 T BEFORE LUNCH  3  . cetirizine (ZYRTEC ALLERGY) 10 MG tablet Take 1 tablet (10 mg total) by mouth daily.    . divalproex (DEPAKOTE) 500 MG DR tablet Take 1 tablet (500 mg total) by mouth daily. 90 tablet 3  . exemestane (AROMASIN) 25 MG tablet TAKE 1 TABLET(25 MG) BY MOUTH DAILY AFTER BREAKFAST 90 tablet 3  . fluticasone (FLONASE) 50 MCG/ACT nasal spray Reported on 02/03/2016    . fluticasone (FLOVENT HFA) 44 MCG/ACT inhaler Inhale 2 puffs into the lungs 2 (two) times daily. 1 Inhaler 12  . hydrochlorothiazide (HYDRODIURIL) 25 MG tablet Take 25 mg by mouth at bedtime.     Marland Kitchen linagliptin (TRADJENTA) 5 MG TABS tablet Take 5 mg by mouth daily.    . montelukast (SINGULAIR) 10 MG tablet Take 1 tablet (10 mg total) by mouth at bedtime. 30 tablet 5  . exemestane (AROMASIN) 25 MG tablet TAKE 1 TABLET(25 MG) BY MOUTH DAILY AFTER BREAKFAST 90 tablet 0  . glucose blood (ONETOUCH VERIO) test strip     . [facility] DELICA  LANCETS FINE MISC      Facility-Administered Medications Prior to Visit  Medication Dose Route Frequency Provider Last Rate Last Dose  . fluticasone (FLOVENT HFA) 44 MCG/ACT inhaler 2 puff  2 puff Inhalation BID Chesley Mires, MD        PAST MEDICAL HISTORY: Past Medical History:  Diagnosis Date  . Abdominal pain   . Breast cancer ([place]) 06/2013   left upper inner  . Cancer ([place])    breast  . Diabetes mellitus without complication ([place])    Takes Tradjenta  . Family history of adverse reaction to anesthesia    Patients mother had to be resusciated after having anesthesia  . GERD (gastroesophageal reflux disease)   . Headache(784.0)    Takes Depakote  . Hernia, incisional periumbilical, incarcerated 02/09/9469  . Hot flashes   . Hx of radiation therapy 09/09/13- 10/13/13   left breast 5000 cGy in 25 sessions, no boost  . Hyperlipidemia   . Hypertension    PCP DR Nolon Rod  . Hypothyroidism   . Peripheral vascular disease ([place])     pulmonary embolis-still have some  . Post concussion syndrome    4 yrs ago- sees [facility]- Dr Leonie Man every 6 months-   has sleep study 2011 following accident but was neg per patient-  short term memory issues, dates and times  . Pulmonary embolism ([place]) 2013  . Recurrent upper respiratory infection (URI)    FLU 08/23/11-08/29/11 then URI 08/29/11- 09/08/11- S/P zpack and steroids-  improved      no cough or congestion  . Thyroid disease   . 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 Ventral hernia     PAST SURGICAL HISTORY: Past Surgical History:  Procedure Laterality Date  . APPENDECTOMY  2002   lap appy.  Dr. Hassell Done  . BREAST LUMPECTOMY WITH NEEDLE LOCALIZATION AND AXILLARY SENTINEL LYMPH NODE BX Left 08/04/2013   Procedure: BREAST LUMPECTOMY WITH NEEDLE LOCALIZATION AND AXILLARY SENTINEL LYMPH NODE Biopsy x 3;  Surgeon: Rolm Bookbinder, MD;  Location: [facility];  Service: General;  Laterality: Left;  . BREAST SURGERY    . CARPAL TUNNEL RELEASE  10/2009-12/2010   left - right  . CESAREAN SECTION  X 2  . COLONOSCOPY    . HERNIA REPAIR  09/15/11   Ventral w/mesh  . JOINT REPLACEMENT    . KNEE ARTHROPLASTY Right 12/22/2015   Procedure: COMPUTER ASSISTED TOTAL KNEE ARTHROPLASTY;  Surgeon: Marybelle Killings, MD;  Location: [facility];  Service: Orthopedics;  Laterality: Right;  . LAPAROSCOPIC CHOLECYSTECTOMY  2006   Dr. Bubba Camp  . TARSAL TUNNEL RELEASE Bilateral   . THYROIDECTOMY, PARTIAL    . VENTRAL HERNIA REPAIR  09/29/2011   Procedure: LAPAROSCOPIC VENTRAL HERNIA;  Surgeon: Adin Hector, MD;  Location: [facility];  Service: General;  Laterality: N/A;  Laparoscopic Ventral Wall Hernia Repair with Mesh    FAMILY HISTORY: Family History  Problem Relation Age of Onset  . Malignant hyperthermia Mother   . Heart attack Father   . Stroke Father   . Diabetes Unknown        Grandmother  . Other Unknown        respiratory- Grandmother  . Cancer Maternal Grandmother 70       breast      SOCIAL HISTORY: Social History   Socioeconomic History  . Marital status: Divorced    Spouse name: Not on file  . Number of children: 2  . Years of education:  Master's  . Highest education level: Not on file  Social Needs  . Financial resource strain: Not on file  . Food insecurity - worry: Not on file  . Food insecurity - inability: Not on file  . Transportation needs - medical: Not on file  . Transportation needs - non-medical: Not on file  Occupational History    Employer: [employer]  Tobacco Use  . Smoking status: Never Smoker  . Smokeless tobacco: Never Used  Substance and Sexual Activity  . Alcohol use: No    Alcohol/week: 0.6 oz    Types: 1 Glasses of wine per week  . Drug use: No  . Sexual activity: Yes    Comment: Menarche age 10, first live birth age 24,  [place] P2. She stopped having periods approximately 1999 and took HRT until 2005.  Other Topics Concern  . Not on file  Social History Narrative   Patient is divorced and lives alone.   Patient has two children.   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   Patient has a Master's degree.   Patient drinks 3-4 cups of caffeine daily.    Patient is right handed.     PHYSICAL EXAM  Vitals:   07/12/17 1506  BP: (!) 140/92  Pulse: 74  Weight: 213 lb (96.6 kg)  Height: 5' 2"  (1.575 m)   Body mass index is 38.96 kg/m. Generalized: Well developed, obese female in no acute distress  Head: normocephalic and atraumatic,. Oropharynx benign  Neck: Supple, no carotid bruits  Cardiac: Regular rate rhythm, no murmur  Musculoskeletal: No deformity   Neurological examination   Mentation: Alert oriented to time, place, history taking. MMSE 29/30. AFT 8. Clock drawing 4 out of 4 . Last 29/30. Follows all commands speech and language fluent  Cranial nerve II-XII: Pupils were equal round reactive to light extraocular movements were full, visual field were full on confrontational test.  Facial sensation and strength were normal. hearing was intact to finger rubbing bilaterally. Uvula tongue midline. head turning and shoulder shrug were normal and symmetric.Tongue protrusion into cheek strength was normal. Motor: normal bulk and tone, full strength in the BUE, BLE, fine finger movements normal, no pronator drift.  Sensory: normal and symmetric to light touch,  Coordination: finger-nose-finger, heel-to-shin bilaterally, no dysmetria Reflexes: 1+ upper lower and symmetric Gait and Station: Rising up from seated position without assistance, normal stance, moderate stride, good arm swing, smooth turning, able to perform tiptoe, and heel walking without difficulty. Tandem gait is steady  DIAGNOSTIC DATA (LABS, IMAGING, TESTING) - I reviewed patient records, labs, notes, testing and imaging myself where available.  Lab Results  Component Value Date   WBC 5.3 07/27/2016   HGB 13.9 07/27/2016   HCT 41.9 07/27/2016   MCV 86.0 07/27/2016   PLT 238 07/27/2016      Component Value Date/Time   NA 140 07/27/2016 1450   K 3.4 (L) 07/27/2016 1450   CL 102 12/23/2015 0500   CO2 29 07/27/2016 1450   GLUCOSE 85 07/27/2016 1450   BUN 15.4 07/27/2016 1450   CREATININE 0.8 07/27/2016 1450   CALCIUM 9.8 07/27/2016 1450   PROT 7.4 07/27/2016 1450   ALBUMIN 4.1 07/27/2016 1450   AST 21 07/27/2016 1450   ALT 27 07/27/2016 1450   ALKPHOS 59 07/27/2016 1450   BILITOT 0.75 07/27/2016 1450   GFRNONAA >60 12/23/2015 0500   GFRAA >60 12/23/2015 0500    ASSESSMENT AND PLAN  66 y.o.  year old female  has a past medical history of Hyperlipidemia; Post concussion syndrome;  Hypertension; Headache(784.0);  here to follow-up for her memory loss which is stable and her headaches which is stable.   PLANContinue Depakote as ordered will refill for 1 year,  Reviewed most recent CBC and CMP, due again in January2019 Memory score remains stable F/U yearly Alyssa [name], [facility], [facility],  APRN  [facility] [address] [place], [place] [address] [phone]

## 2017-07-12 ENCOUNTER — Ambulatory Visit: Payer: BC Managed Care – PPO | Admitting: Nurse Practitioner

## 2017-07-12 ENCOUNTER — Telehealth: Payer: Self-pay | Admitting: Nurse Practitioner

## 2017-07-12 ENCOUNTER — Encounter: Payer: Self-pay | Admitting: Nurse Practitioner

## 2017-07-12 VITALS — BP 140/92 | HR 74 | Ht 62.0 in | Wt 213.0 lb

## 2017-07-12 DIAGNOSIS — R51 Headache: Secondary | ICD-10-CM | POA: Diagnosis not present

## 2017-07-12 DIAGNOSIS — F0781 Postconcussional syndrome: Secondary | ICD-10-CM

## 2017-07-12 DIAGNOSIS — R413 Other amnesia: Secondary | ICD-10-CM | POA: Diagnosis not present

## 2017-07-12 DIAGNOSIS — R519 Headache, unspecified: Secondary | ICD-10-CM

## 2017-07-12 MED ORDER — DIVALPROEX SODIUM 500 MG PO DR TAB: 500.0000 mg | DELAYED_RELEASE_TABLET | Freq: Every day | ORAL | 3 refills | Status: DC

## 2017-07-12 NOTE — Patient Instructions (Addendum)
Continue Depakote as ordered will refill for 1 year,  Memory score remains stable F/U yearly

## 2017-07-12 NOTE — Telephone Encounter (Signed)
Spoke to pharmacist and she had question of pt being on ER or DR.  Looking at past prescriptions, pt has been on both, but last yr was the DR.  Will refill as DR, per CM/NP ordered.

## 2017-07-12 NOTE — Telephone Encounter (Signed)
[facility] [place], [place] - [address] [place] [address] [place] [address] [phone] (Phone) [phone] (Fax   Has called and is asking for a call to confirm the name of the medication that was sent over for pt.  Please call

## 2017-07-13 ENCOUNTER — Telehealth: Payer: Self-pay | Admitting: Pulmonary Disease

## 2017-07-13 MED ORDER — OMEPRAZOLE 40 MG PO CPDR: 40.0000 mg | DELAYED_RELEASE_CAPSULE | Freq: Every day | ORAL | 3 refills | Status: DC

## 2017-07-13 NOTE — Telephone Encounter (Signed)
Available over the counter but if wants rx then ok to refill x 3 months(whatever dose she was previously on)  but after that would need to be seen here vs PCP to determine longterm options and risk/benefits vs refer to GI

## 2017-07-13 NOTE — Telephone Encounter (Signed)
Patient states she needs prilosec again due to chronic cough. Per VS OV on 06-01-17 patient was to call if symptoms returned. Patient would like RX of Prilosec  MW please advise as DOD as VS is not available today.

## 2017-07-13 NOTE — Telephone Encounter (Signed)
Rx for prilosec 40 has been sent to preferred pharmacy, as pt states medication is cheaper with insurance. Pt has pending recall for 09/2017 Nothing further is needed.

## 2017-07-21 ENCOUNTER — Other Ambulatory Visit: Payer: Self-pay | Admitting: Nurse Practitioner

## 2017-07-23 ENCOUNTER — Ambulatory Visit
Admission: RE | Admit: 2017-07-23 | Discharge: 2017-07-23 | Disposition: A | Discharge status: Home / Self Care | Payer: Medicare Other | Source: Ambulatory Visit | Attending: Oncology | Admitting: Oncology

## 2017-07-23 DIAGNOSIS — Z853 Personal history of malignant neoplasm of breast: Secondary | ICD-10-CM

## 2017-07-31 NOTE — Progress Notes (Signed)
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 Left diagnostic mammography and ultrasonography 07/21/2013 showed an irregular high-density mass in the upper inner quadrant of the left breast which was palpable at the 11:00 position 5 cm from the nipple. Ultrasound confirmed an irregular hypoechoic mass measuring 1.1 cm. The left axilla was unremarkable.  Biopsy of this mass was obtained the same day and showed ([id number]) an invasive ductal carcinoma, grade 1, estrogen and progesterone receptor both strongly positive by verbal report, with no HER-2 amplification (signals ratio 1.19, number per cell 1.85) and an MIB-1 of 5%.  The patient's subsequent history is as detailed below.  INTERVAL HISTORY: Alyssa [name] returns today for follow up of her estrogen receptor postive breast cancer. She continues on exemestane, with good tolerance. She notes that she pays $1 per pill, but the price may go up through her insurance, [employer], by almost double. She notes that she would like to not change this medication if she can afford it because it has given her the least problems.   Since her last visit, she underwent diagnostic bilateral mammography with CAD and tomography on 07/23/2017 at [facility] showing: breast density category B. There was no evidence of malignancy.   REVIEW OF SYSTEMS: Alyssa [name] reports that she retired from working in [place]. 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 She notes that they have a good graduation rate and she is able to help children of many ages. She denies unusual headaches, visual changes, nausea, vomiting, or dizziness. There has been no unusual cough, phlegm production, or pleurisy. This been no change in bowel or bladder habits. She denies unexplained fatigue or unexplained weight loss, bleeding, rash, or fever. A detailed review of systems was otherwise stable.   PAST MEDICAL HISTORY: Past Medical History:  Diagnosis Date  . Abdominal pain   . Breast cancer ([place]) 06/2013   left upper inner  . Cancer ([place])    breast  . Diabetes mellitus without complication ([place])    Takes Tradjenta  . Family history of adverse reaction to anesthesia    Patients mother had to be resusciated after having anesthesia  . GERD (gastroesophageal reflux disease)   . Headache(784.0)    Takes Depakote  . Hernia, incisional periumbilical, incarcerated 01/31/6578  . Hot flashes   . Hx of radiation therapy 09/09/13- 10/13/13   left breast 5000 cGy in 25 sessions, no boost  . Hyperlipidemia   . Hypertension    PCP DR Nolon Rod  . Hypothyroidism   . Peripheral vascular disease ([place])    pulmonary embolis-still have some  . Post concussion syndrome    4 yrs ago- sees [facility]- Dr Leonie Man every 6 months-   has sleep study 2011 following accident but was neg per patient-  short term memory issues, dates and times  . Pulmonary embolism ([place]) 2013  . Recurrent upper respiratory infection (URI)    FLU 08/23/11-08/29/11 then URI 08/29/11- 09/08/11- S/P zpack and steroids-  improved      no cough or congestion  . Thyroid disease   . Thyroid mass, left inferior lobe, 3.6cm [id number] 10/07/2011  . 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 APPENDECTOMY  2002   lap appy.  Dr. Hassell Done  . BREAST LUMPECTOMY Left  2015   radiation  . BREAST LUMPECTOMY WITH NEEDLE LOCALIZATION AND AXILLARY SENTINEL LYMPH NODE BX Left 08/04/2013   Procedure: BREAST LUMPECTOMY WITH NEEDLE LOCALIZATION AND AXILLARY SENTINEL LYMPH NODE Biopsy x 3;  Surgeon: Rolm Bookbinder, MD;  Location: [facility];  Service: General;  Laterality: Left;  . BREAST SURGERY    . CARPAL TUNNEL RELEASE  10/2009-12/2010   left - right  . CESAREAN SECTION  X 2  . COLONOSCOPY    . HERNIA REPAIR  09/15/11   Ventral w/mesh  . JOINT REPLACEMENT    . KNEE ARTHROPLASTY Right 12/22/2015   Procedure: COMPUTER ASSISTED TOTAL KNEE ARTHROPLASTY;  Surgeon: Marybelle Killings, MD;  Location: [facility];  Service: Orthopedics;  Laterality: Right;  . LAPAROSCOPIC CHOLECYSTECTOMY  2006   Dr. Bubba Camp  . TARSAL TUNNEL RELEASE Bilateral   . THYROIDECTOMY, PARTIAL    . VENTRAL HERNIA REPAIR  09/29/2011   Procedure: LAPAROSCOPIC VENTRAL HERNIA;  Surgeon: Adin Hector, MD;  Location: [facility];  Service: General;  Laterality: N/A;  Laparoscopic Ventral Wall Hernia Repair with Mesh    FAMILY HISTORY Family History  Problem Relation Age of Onset  . Malignant hyperthermia Mother   . Heart attack Father   . Stroke Father   . Diabetes Unknown        Grandmother  . Other Unknown        respiratory- Grandmother  . Cancer Maternal Grandmother 46       breast   patient's father died at the age of 42 from a myocardial infarction. The patient's mother, Alyssa [name], is alive at 1. She lives with the patient, and is very independent (drives, etc.) The patient has 2 brothers, no sisters. The patient's mother is mother was diagnosed with breast cancer at the age of 28. There is no history of breast or ovarian cancer in the family (there is a history of cervical cancer in the patient's daughter).  GYNECOLOGIC HISTORY:  Menarche age 49, first live birth age 24, the patient is [place] P2. She stopped having periods approximately 1999 and took hormone replacements until 2005.  SOCIAL HISTORY: (Updated  08/02/2017) Alyssa [name] retired from working in [place] and now works at an alternative school in [place]. She is divorced and lives with her mother Alyssa [name]. Daughter Alyssa [name] is a business woman living in [place]. Son Alyssa [name] lives in [place], also in business. The patient has 3 grandchildren. She attends [employer]    ADVANCED DIRECTIVES: Not in place   HEALTH MAINTENANCE: Social History   Tobacco Use  . Smoking status: Never Smoker  . Smokeless tobacco: Never Used  Substance Use Topics  . Alcohol use: No    Alcohol/week: 0.6 oz    Types: 1 Glasses of wine per week  . Drug use: No     Colonoscopy: Never  PAP:  Bone density: Never  Lipid panel:  Allergies  Allergen Reactions  . Aleve [Naproxen Sodium] Hives, Itching and Swelling  . Aromasin [Exemestane] Anaphylaxis, Itching and Swelling    Pt currently tolerating medication well (02/03/16)-gwd;  Pt stated "I can take this medication for a few months at a time and I take a benadryl when I feel it starting to come on" Patient has facial swelling, itching, and throat swelling  07/12/17  Taking this right now and no problem. sy/rn  . Letrozole Shortness Of Breath, Rash and Other (See Comments)    Swelling - feet, eyes,hands;   Speech garbled  . Levofloxacin Rash and Swelling    Facial swelling and red facial rash  . Penicillins Hives, Swelling and Rash    All over the body. Has patient had a PCN reaction causing immediate rash, facial/tongue/throat swelling, SOB or lightheadedness with hypotension: Yes Has patient had a PCN reaction causing severe rash involving mucus membranes or skin necrosis: No Has patient had a PCN reaction that required hospitalization No Has patient had a PCN reaction occurring within the last 10 years: No If all of the above answers are "NO", then may proceed with Cephalosporin use.   . Symbicort [Budesonide-Formoterol Fumarate] Anaphylaxis, Hives, Swelling and Rash  .  Codeine Other (See Comments)    hallucinations  . Other Swelling    Swelling of  Eyes   Wheat ,oranges banana, zucchini,cayenne,chili  Peppers, sweet potatoes, pumpkin  07/12/17 no problem at this time.   Marland Kitchen Percocet [Oxycodone-Acetaminophen] Other (See Comments)    Causes syncope day after medication has been taken.  . Vicodin [Hydrocodone-Acetaminophen] Other (See Comments)    Causes syncope day after medication has been taken.  . Prednisone Other (See Comments)    Unknown  . Cefdinir Rash and Swelling    Current Outpatient Medications  Medication Sig Dispense Refill  . ALPRAZolam (XANAX) 0.5 MG tablet Take 0.5 mg by mouth at bedtime as needed for anxiety or sleep.     Francia Greaves THYROID 90 MG tablet TK 1 T PO QAM ON AN EMPTY STOMACH AND 1/2 T BEFORE LUNCH  3  . cetirizine (ZYRTEC ALLERGY) 10 MG tablet Take 1 tablet (10 mg total) by mouth daily.    . divalproex (DEPAKOTE) 500 MG DR tablet Take 1 tablet (500 mg total) by mouth daily. 90 tablet 3  . exemestane (AROMASIN) 25 MG tablet TAKE 1 TABLET(25 MG) BY MOUTH DAILY AFTER BREAKFAST 90 tablet 3  . fluticasone (FLONASE) 50 MCG/ACT nasal spray Reported on 02/03/2016    . fluticasone (FLOVENT HFA) 44 MCG/ACT inhaler Inhale 2 puffs into the lungs 2 (two) times daily. 1 Inhaler 12  . hydrochlorothiazide (HYDRODIURIL) 25 MG tablet Take 25 mg by mouth at bedtime.     Marland Kitchen linagliptin (TRADJENTA) 5 MG TABS tablet Take 5 mg by mouth daily.    . montelukast (SINGULAIR) 10 MG tablet Take 1 tablet (10 mg total) by mouth at bedtime. 30 tablet 5  . omeprazole (PRILOSEC) 40 MG capsule Take 1 capsule (40 mg total) by mouth daily. 30 capsule 3   Current Facility-Administered Medications  Medication Dose Route Frequency Provider Last Rate Last Dose  . fluticasone (FLOVENT HFA) 44 MCG/ACT inhaler 2 puff  2 puff Inhalation BID Chesley Mires, MD        OBJECTIVE: Middle-aged white woman in no acute distress Vitals:   08/02/17 1243  BP: 135/75  Pulse: 79   Resp: 18  Temp: 98.4 F (36.9 C)  SpO2: 97%     Body mass index is 37.99 kg/m.    ECOG FS:0 - Asymptomatic  Sclerae unicteric, EOMs intact Oropharynx clear and moist No cervical or supraclavicular adenopathy Lungs no rales or rhonchi  Heart regular rate and rhythm Abd soft, nontender, positive bowel sounds MSK no focal spinal tenderness, no upper extremity lymphedema Neuro: nonfocal, well oriented, appropriate affect Breasts: Right breast is unremarkable.  Left breast is undergone lumpectomy and radiation.  There is no evidence of local recurrence.  Both axillae are benign   LAB RESULTS:  CMP     Component Value Date/Time   NA 138 08/02/2017 1211   NA 140 07/27/2016 1450   K 3.8 08/02/2017 1211   K 3.4 (L) 07/27/2016 1450   CL 100 08/02/2017 1211   CO2 26 08/02/2017 1211   CO2 29 07/27/2016 1450   GLUCOSE 100 08/02/2017 1211   GLUCOSE 85 07/27/2016 1450   BUN 21 08/02/2017 1211   BUN 15.4 07/27/2016 1450   CREATININE 0.92 08/02/2017 1211   CREATININE 0.8 07/27/2016 1450   CALCIUM 9.9 08/02/2017 1211   CALCIUM 9.8 07/27/2016 1450   PROT 7.9 08/02/2017 1211   PROT 7.4 07/27/2016 1450   ALBUMIN 4.5 08/02/2017 1211   ALBUMIN 4.1 07/27/2016 1450   AST 23 08/02/2017 1211   AST 21 07/27/2016 1450   ALT 45 08/02/2017 1211   ALT 27 07/27/2016 1450   ALKPHOS 56 08/02/2017 1211   ALKPHOS 59 07/27/2016 1450   BILITOT 0.6 08/02/2017 1211   BILITOT 0.75 07/27/2016 1450   GFRNONAA >60 08/02/2017 1211   GFRAA >60 08/02/2017 1211    I No results found for: SPEP  Lab Results  Component Value Date   WBC 6.0 08/02/2017   NEUTROABS 3.4 08/02/2017   HGB 15.0 08/02/2017   HCT 46.1 08/02/2017   MCV 84.4 08/02/2017   PLT 282 08/02/2017      Chemistry      Component Value Date/Time   NA 138 08/02/2017 1211   NA 140 07/27/2016 1450   K 3.8 08/02/2017 1211   K 3.4 (L) 07/27/2016 1450   CL 100 08/02/2017 1211   CO2 26 08/02/2017 1211   CO2 29 07/27/2016 1450   BUN 21  08/02/2017 1211   BUN 15.4 07/27/2016 1450   CREATININE 0.92 08/02/2017 1211   CREATININE 0.8 07/27/2016 1450      Component Value Date/Time   CALCIUM 9.9 08/02/2017 1211   CALCIUM 9.8 07/27/2016 1450   ALKPHOS 56 08/02/2017 1211   ALKPHOS 59 07/27/2016 1450   AST 23 08/02/2017 1211   AST 21 07/27/2016 1450   ALT 45 08/02/2017 1211   ALT 27 07/27/2016 1450   BILITOT 0.6 08/02/2017 1211   BILITOT 0.75 07/27/2016 1450       No results found for: LABCA2  No components found for: LABCA125  No results for input(s): INR in the last 168 hours.  Urinalysis    Component Value Date/Time   COLORURINE YELLOW 12/10/2015 1556   APPEARANCEUR CLEAR 12/10/2015 1556   LABSPEC 1.024 12/10/2015 1556   PHURINE 5.5 12/10/2015 1556   GLUCOSEU NEGATIVE 12/10/2015 1556   HGBUR NEGATIVE 12/10/2015 1556   BILIRUBINUR NEGATIVE 12/10/2015 1556   KETONESUR NEGATIVE 12/10/2015 1556   PROTEINUR NEGATIVE 12/10/2015 1556   UROBILINOGEN 0.2 10/07/2011 0501   NITRITE NEGATIVE 12/10/2015 1556   [place] 12/10/2015 1556    STUDIES:Mm Diag Breast Tomo Bilateral  Result Date: 07/23/2017 CLINICAL DATA:  Left lumpectomy with radiation therapy 2015. EXAM: 2D DIGITAL DIAGNOSTIC BILATERAL MAMMOGRAM WITH CAD AND ADJUNCT TOMO COMPARISON:  Prior studies including 07/19/2016 ACR Breast Density Category b: There are scattered areas of fibroglandular density. FINDINGS: Post operative changes are seen  in the leftbreast. No suspicious mass, distortion, or microcalcifications are identified to suggest presence of malignancy. An area of possible asymmetry in the right breast does not persist on dedicated spot compression view. Mammographic images were processed with CAD. IMPRESSION: No mammographic evidence for malignancy. RECOMMENDATION: Diagnostic mammogram is suggested in 1 year. (Code:DM-B-01Y) I have discussed the findings and recommendations with the patient. Results were also provided in writing at the  conclusion of the visit. If applicable, a reminder letter will be sent to the patient regarding the next appointment. BI-RADS CATEGORY  2: Benign. Electronically Signed   By: Nolon Nations M.D.   On: 07/23/2017 11:13    ASSESSMENT: 67 y.o. [place] woman status post left breast biopsy 07/21/2013 for a clinical T1 N0, stage IA invasive ductal carcinoma, grade 1, estrogen and progesterone receptor strongly positive, HER-2 not amplified, with an MIB-1 of 5%  (1) History of pulmonary embolism documented by CT angiography 10/07/2011 (9 days after ventral hernia repair); Dopplers 11-2011 showed no lower extremity DVTs; the patient received Coumadin for 10 months. Hypercoagulable  Workup performed in January 2015 showed negative LAC, anti-cardiolipin Abs, negative to borderline beta-2-glycoprotein Abs, AT, protein C and S, prothrombin II gene mutation and Factor V Leiden  (2) status post left lumpectomy and sentinel lymph node sampling 08/04/2013 for a pT1c pN0, stage IA invasive ductal carcinoma, grade 1, with repeat HER-2 again negative.  (3) Oncotype score of 14 predicts a risk of outside the breast recurrence within 10 years of 9% if the patient's only systemic therapy is tamoxifen for 5 years. Also predicts no benefit from chemotherapy.  (4) Adjuvant Radiation therapy started on 09/09/2013 completed on 10/13/2013  (5) Adjuvant Aromatase inhibitor therapy with Letrozole start date-11/19/2013  (a) Letrozole was discontinued 12/03/2013 secondary to allergic reaction (rash)  (b) anastrozole start date 12/05/2013, discontinued 12/01/2014 due to side effects  (c) exemestane started July 2016-- to be continued through June 2020  (6) osteopenia with a T score of -2.2 on bone density 10/29/2013  (a) zolendronate given 11/05/2014, not repeated per patient preference  (b) bone density 08/31/2016 showed osteopenia with a T score of -1.5 at the [facility]  PLAN: Mariene is now 4 years out from definitive  surgery for her breast cancer with no evidence of disease recurrence.  This is very favorable.  She is tolerating exemestane well.  I am hopeful she will be able to obtain it at an affordable price.  In addition to placing the prescriptions through her regular pharmacy I gave her a written prescription so she can "shop it" locally.  If she finds that it is very expensive she will let us know and we will consider alternatives although she had problems with letrozole and anastrozole previously.  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  I really commended her for a terrific job she is doing  She has changed her diet is not eating carbohydrates, is losing weight, and is feeling healthier.  This is also very favorable  She is going to see me one more time March 2020, shortly before she completes her 5 years of antiestrogens (which will take her through May 2020).  Her next visit therefore will be her "graduation".  She knows to call for any other issues that may develop before then.  Keydi Giel, Virgie Dad, MD  08/02/17 1:09 PM Medical Oncology and Hematology [facility] [address] [place], [place] [address] Tel. [phone]    Fax. [phone]  This document serves as a record of services personally performed by Lurline Del, MD. It was created on his behalf by Sheron Nightingale, a trained medical scribe. The creation of this record is based on the scribe's personal observations and the provider's statements to them.   I have reviewed the above documentation for accuracy and completeness, and I agree with the above.

## 2017-08-02 ENCOUNTER — Inpatient Hospital Stay: Payer: Medicare Other | Attending: Oncology

## 2017-08-02 ENCOUNTER — Ambulatory Visit: Payer: Self-pay | Admitting: Oncology

## 2017-08-02 ENCOUNTER — Telehealth: Payer: Self-pay | Admitting: Oncology

## 2017-08-02 ENCOUNTER — Inpatient Hospital Stay (HOSPITAL_BASED_OUTPATIENT_CLINIC_OR_DEPARTMENT_OTHER): Payer: Medicare Other | Admitting: Oncology

## 2017-08-02 ENCOUNTER — Other Ambulatory Visit: Payer: Self-pay

## 2017-08-02 VITALS — BP 135/75 | HR 79 | Temp 98.4°F | Resp 18 | Wt 207.7 lb

## 2017-08-02 DIAGNOSIS — Z79811 Long term (current) use of aromatase inhibitors: Secondary | ICD-10-CM | POA: Diagnosis not present

## 2017-08-02 DIAGNOSIS — Z170 Estrogen receptor positive status [ER+]: Secondary | ICD-10-CM

## 2017-08-02 DIAGNOSIS — Z86711 Personal history of pulmonary embolism: Secondary | ICD-10-CM | POA: Diagnosis not present

## 2017-08-02 DIAGNOSIS — Z803 Family history of malignant neoplasm of breast: Secondary | ICD-10-CM | POA: Diagnosis not present

## 2017-08-02 DIAGNOSIS — Z923 Personal history of irradiation: Secondary | ICD-10-CM | POA: Insufficient documentation

## 2017-08-02 DIAGNOSIS — C50212 Malignant neoplasm of upper-inner quadrant of left female breast: Secondary | ICD-10-CM

## 2017-08-02 DIAGNOSIS — M8580 Other specified disorders of bone density and structure, unspecified site: Secondary | ICD-10-CM | POA: Diagnosis not present

## 2017-08-02 DIAGNOSIS — I2692 Saddle embolus of pulmonary artery without acute cor pulmonale: Secondary | ICD-10-CM

## 2017-08-02 LAB — COMPREHENSIVE METABOLIC PANEL
ALBUMIN: 4.5 g/dL (ref 3.5–5.0)
ALT: 45 U/L (ref 0–55)
ANION GAP: 12 — AB (ref 3–11)
AST: 23 U/L (ref 5–34)
Alkaline Phosphatase: 56 U/L (ref 40–150)
BILIRUBIN TOTAL: 0.6 mg/dL (ref 0.2–1.2)
BUN: 21 mg/dL (ref 7–26)
CO2: 26 mmol/L (ref 22–29)
Calcium: 9.9 mg/dL (ref 8.4–10.4)
Chloride: 100 mmol/L (ref 98–109)
Creatinine, Ser: 0.92 mg/dL (ref 0.60–1.10)
GFR calc Af Amer: >60 mL/min (ref >60)
GFR calc non Af Amer: >60 mL/min (ref >60)
GLUCOSE: 100 mg/dL (ref 70–140)
POTASSIUM: 3.8 mmol/L (ref 3.3–4.7)
SODIUM: 138 mmol/L (ref 136–145)
TOTAL PROTEIN: 7.9 g/dL (ref 6.4–8.3)

## 2017-08-02 LAB — CBC WITH DIFFERENTIAL/PLATELET
BASOS ABS: 0.1 10*3/uL (ref 0.0–0.1)
Basophils Relative: 1 %
Eosinophils Absolute: 0.1 10*3/uL (ref 0.0–0.5)
Eosinophils Relative: 2 %
HEMATOCRIT: 46.1 % (ref 34.8–46.6)
Hemoglobin: 15 g/dL (ref 11.6–15.9)
LYMPHS ABS: 1.9 10*3/uL (ref 0.9–3.3)
LYMPHS PCT: 31 %
MCH: 27.5 pg (ref 25.1–34.0)
MCHC: 32.6 g/dL (ref 31.5–36.0)
MCV: 84.4 fL (ref 79.5–101.0)
MONO ABS: 0.5 10*3/uL (ref 0.1–0.9)
Monocytes Relative: 9 %
NEUTROS ABS: 3.4 10*3/uL (ref 1.5–6.5)
Neutrophils Relative %: 57 %
Platelets: 282 10*3/uL (ref 145–400)
RBC: 5.47 MIL/uL — AB (ref 3.70–5.45)
RDW: 14.4 % (ref 11.2–16.1)
WBC: 6 10*3/uL (ref 3.9–10.3)

## 2017-08-02 MED ORDER — EXEMESTANE 25 MG PO TABS: 25.0000 mg | ORAL_TABLET | Freq: Every day | ORAL | 4 refills | Status: DC

## 2017-08-02 NOTE — Telephone Encounter (Signed)
Couldn't schedule appt in March of 2020 due to the template being closed. Patient said she will call back and get it scheduled.

## 2017-10-02 ENCOUNTER — Ambulatory Visit: Payer: Self-pay | Admitting: Oncology

## 2017-10-02 ENCOUNTER — Other Ambulatory Visit: Payer: Self-pay

## 2017-10-26 ENCOUNTER — Other Ambulatory Visit: Payer: Self-pay | Admitting: Pulmonary Disease

## 2017-11-05 ENCOUNTER — Inpatient Hospital Stay: Payer: Medicare Other | Attending: Oncology | Admitting: Medical

## 2017-11-05 ENCOUNTER — Encounter: Payer: Self-pay | Admitting: *Deleted

## 2017-11-05 ENCOUNTER — Inpatient Hospital Stay: Payer: Medicare Other

## 2017-11-05 ENCOUNTER — Telehealth: Payer: Self-pay | Admitting: *Deleted

## 2017-11-05 VITALS — BP 144/72 | HR 64 | Temp 98.5°F | Resp 20 | Ht 62.0 in | Wt 197.4 lb

## 2017-11-05 DIAGNOSIS — R0602 Shortness of breath: Secondary | ICD-10-CM | POA: Diagnosis not present

## 2017-11-05 DIAGNOSIS — C50212 Malignant neoplasm of upper-inner quadrant of left female breast: Secondary | ICD-10-CM

## 2017-11-05 DIAGNOSIS — M7989 Other specified soft tissue disorders: Secondary | ICD-10-CM | POA: Diagnosis not present

## 2017-11-05 DIAGNOSIS — R21 Rash and other nonspecific skin eruption: Secondary | ICD-10-CM | POA: Insufficient documentation

## 2017-11-05 DIAGNOSIS — Z79811 Long term (current) use of aromatase inhibitors: Secondary | ICD-10-CM | POA: Diagnosis not present

## 2017-11-05 DIAGNOSIS — M8580 Other specified disorders of bone density and structure, unspecified site: Secondary | ICD-10-CM

## 2017-11-05 DIAGNOSIS — Z86711 Personal history of pulmonary embolism: Secondary | ICD-10-CM | POA: Insufficient documentation

## 2017-11-05 DIAGNOSIS — Z170 Estrogen receptor positive status [ER+]: Secondary | ICD-10-CM | POA: Diagnosis not present

## 2017-11-05 DIAGNOSIS — H5789 Other specified disorders of eye and adnexa: Secondary | ICD-10-CM | POA: Insufficient documentation

## 2017-11-05 LAB — CBC WITH DIFFERENTIAL/PLATELET
Basophils Absolute: 0 10*3/uL (ref 0.0–0.1)
Basophils Relative: 0 %
Eosinophils Absolute: 0.1 10*3/uL (ref 0.0–0.5)
Eosinophils Relative: 2 %
HEMATOCRIT: 46.1 % (ref 34.8–46.6)
HEMOGLOBIN: 15 g/dL (ref 11.6–15.9)
Lymphocytes Relative: 42 %
Lymphs Abs: 2.5 10*3/uL (ref 0.9–3.3)
MCH: 28.2 pg (ref 25.1–34.0)
MCHC: 32.5 g/dL (ref 31.5–36.0)
MCV: 86.8 fL (ref 79.5–101.0)
MONO ABS: 0.6 10*3/uL (ref 0.1–0.9)
MONOS PCT: 9 %
NEUTROS ABS: 2.8 10*3/uL (ref 1.5–6.5)
NEUTROS PCT: 47 %
Platelets: 246 10*3/uL (ref 145–400)
RBC: 5.31 MIL/uL (ref 3.70–5.45)
RDW: 13.8 % (ref 11.2–14.5)
WBC: 6 10*3/uL (ref 3.9–10.3)

## 2017-11-05 LAB — COMPREHENSIVE METABOLIC PANEL
ALBUMIN: 4.3 g/dL (ref 3.5–5.0)
ALT: 50 U/L (ref 0–55)
ANION GAP: 11 (ref 3–11)
AST: 27 U/L (ref 5–34)
Alkaline Phosphatase: 62 U/L (ref 40–150)
BUN: 19 mg/dL (ref 7–26)
CO2: 29 mmol/L (ref 22–29)
Calcium: 10.2 mg/dL (ref 8.4–10.4)
Chloride: 100 mmol/L (ref 98–109)
Creatinine, Ser: 0.99 mg/dL (ref 0.60–1.10)
GFR calc non Af Amer: 59 mL/min — ABNORMAL LOW (ref >60)
GLUCOSE: 92 mg/dL (ref 70–140)
POTASSIUM: 3.8 mmol/L (ref 3.5–5.1)
SODIUM: 140 mmol/L (ref 136–145)
Total Bilirubin: 0.6 mg/dL (ref 0.2–1.2)
Total Protein: 8.1 g/dL (ref 6.4–8.3)

## 2017-11-05 MED ORDER — DEXAMETHASONE SODIUM PHOSPHATE 10 MG/ML IJ SOLN
INTRAMUSCULAR | Status: AC
  Filled 2017-11-05: qty 1

## 2017-11-05 MED ORDER — DEXAMETHASONE SODIUM PHOSPHATE 10 MG/ML IJ SOLN
10.0000 mg | Freq: Once | INTRAMUSCULAR | Status: AC
  Administered 2017-11-05: 10 mg via INTRAMUSCULAR

## 2017-11-05 MED ORDER — DEXAMETHASONE SODIUM PHOSPHATE 10 MG/ML IJ SOLN: 10.0000 mg | Freq: Once | INTRAMUSCULAR | 0 refills | Status: AC

## 2017-11-05 MED ORDER — SODIUM CHLORIDE 0.9 % IV SOLN: 10.0000 mg | Freq: Once | INTRAVENOUS | Status: DC

## 2017-11-05 NOTE — Telephone Encounter (Signed)
"  I need to be seen by doctor or nurse about reaction to my chemotherqpy medication.  Started Wednesday.  This has happened before but this time it's not getting better.  Itching hives, flat red rash to face.  Swelling to eyes, cheeks with throat discomfort."  Advised to go to the ED.  "I've taken so much benadryl my tongue nor throat are swollen.  I can eat and drink.  I stopped the exemestane.  I'm an hour away.  Can arrive 10:00 or 10:15."  Scheduling message sent.

## 2017-11-05 NOTE — Patient Instructions (Addendum)
Rash A rash is a change in the color of the skin. A rash can also change the way your skin feels. There are many different conditions and factors that can cause a rash. Follow these instructions at home: Pay attention to any changes in your symptoms. Follow these instructions to help with your condition: Medicine Take or apply over-the-counter and prescription medicines only as told by your doctor. These may include:  Corticosteroid cream.  Anti-itch lotions.  Oral antihistamines.  Skin Care  Put cool compresses on the affected areas.  Try taking a bath with: ? Epsom salts. Follow the instructions on the packaging. You can get these at your local pharmacy or grocery store. ? Baking soda. Pour a small amount into the bath as told by your doctor. ? Colloidal oatmeal. Follow the instructions on the packaging. You can get this at your local pharmacy or grocery store.  Try putting baking soda paste onto your skin. Stir water into baking soda until it gets like a paste.  Do not scratch or rub your skin.  Avoid covering the rash. Make sure the rash is exposed to air as much as possible. General instructions  Avoid hot showers or baths, which can make itching worse. A cold shower may help.  Avoid scented soaps, detergents, and perfumes. Use gentle soaps, detergents, perfumes, and other cosmetic products.  Avoid anything that causes your rash. Keep a journal to help track what causes your rash. Write down: ? What you eat. ? What cosmetic products you use. ? What you drink. ? What you wear. This includes jewelry.  Keep all follow-up visits as told by your doctor. This is important. Contact a doctor if:  You sweat at night.  You lose weight.  You pee (urinate) more than normal.  You feel weak.  You throw up (vomit).  Your skin or the whites of your eyes look yellow (jaundice).  Your skin: ? Tingles. ? Is numb.  Your rash: ? Does not go away after a few days. ? Gets  worse.  You are: ? More thirsty than normal. ? More tired than normal.  You have: ? New symptoms. ? Pain in your belly (abdomen). ? A fever. ? Watery poop (diarrhea). Get help right away if:  Your rash covers all or most of your body. The rash may or may not be painful.  You have blisters that: ? Are on top of the rash. ? Grow larger. ? Grow together. ? Are painful. ? Are inside your nose or mouth.  You have a rash that: ? Looks like purple pinprick-sized spots all over your body. ? Has a "bull's eye" or looks like a target. ? Is red and painful, causes your skin to peel, and is not from being in the sun too long. This information is not intended to replace advice given to you by your health care provider. Make sure you discuss any questions you have with your health care provider. Document Released: 12/27/2007 Document Revised: 12/16/2015 Document Reviewed: 11/25/2014 Elsevier Interactive Patient Education  2018 [place] 10 mg once daily  Allegra 60 mg twice daily

## 2017-11-05 NOTE — Progress Notes (Signed)
Pt reports itchy red rash on face and facial edema for last few days.  Reports taking benadryl with no relief.  Hx allergic rxns to oral chemos.  Taking Exemestrone but stopped on Thursday d/t rxn.  Denies SOB or tightness in chest/throat or trouble swallowing.

## 2017-11-07 ENCOUNTER — Telehealth: Payer: Self-pay | Admitting: Medical

## 2017-11-07 ENCOUNTER — Telehealth: Payer: Self-pay | Admitting: Emergency Medicine

## 2017-11-07 ENCOUNTER — Inpatient Hospital Stay (HOSPITAL_BASED_OUTPATIENT_CLINIC_OR_DEPARTMENT_OTHER): Payer: Medicare Other | Admitting: Medical

## 2017-11-07 VITALS — BP 115/59 | HR 57 | Temp 97.7°F | Resp 18 | Ht 62.0 in | Wt 200.4 lb

## 2017-11-07 DIAGNOSIS — R0602 Shortness of breath: Secondary | ICD-10-CM

## 2017-11-07 DIAGNOSIS — R21 Rash and other nonspecific skin eruption: Secondary | ICD-10-CM

## 2017-11-07 DIAGNOSIS — Z170 Estrogen receptor positive status [ER+]: Secondary | ICD-10-CM

## 2017-11-07 DIAGNOSIS — Z79811 Long term (current) use of aromatase inhibitors: Secondary | ICD-10-CM | POA: Diagnosis not present

## 2017-11-07 DIAGNOSIS — C50212 Malignant neoplasm of upper-inner quadrant of left female breast: Secondary | ICD-10-CM

## 2017-11-07 MED ORDER — METHYLPREDNISOLONE 4 MG PO TBPK: ORAL_TABLET | ORAL | 0 refills | Status: DC

## 2017-11-07 NOTE — Telephone Encounter (Signed)
Scheduled symptom management appt. Spoke with patient and confirmed.

## 2017-11-07 NOTE — Telephone Encounter (Signed)
Returning pt voicemail.  Pt came to [facility] earlier this week for hives from oral chemo, given steroids.  Pt reports that hives have worsened and wants to see someone today about them.  PA Lucianne Lei aware, pt can be here around 1130-1200.  Will be seen in [facility], no labs.  Pt verbalized understanding.

## 2017-11-07 NOTE — Progress Notes (Signed)
[facility] Progress Note   Alyssa [name] [id number] 1951-02-08 67 y.o.  Alyssa [name] is managed by Dr. Jana Hakim  Actively treated with chemotherapy: no  Current Therapy: Exemestane  Assessment: Plan:    Rash of neck - Plan: dexamethasone (DECADRON) injection 10 mg, DISCONTINUED: dexamethasone (DECADRON) 10 mg in sodium chloride 0.9 % 50 mL IVPB  Please see After Visit Summary for patient specific instructions.  Future Appointments  Date Time Provider [place]  07/15/2018  9:45 AM Dennie Bible, NP [facility] None    No orders of the defined types were placed in this encounter.      Subjective:   Patient ID:  Alyssa [name] is a 67 y.o. (DOB 09-01-1950) female.  Chief Complaint:  Chief Complaint  Patient presents with  . Rash    HPI Alyssa [name] is a 67 year old female with a history of an early stage ER positive left breast cancer who is managed by Dr. Jana Hakim.  The patient is currently treated with exemestane and reports that she has intermittent reactive allergic reactions to this medication.  She reports that it causes her to have a rash.  She has recently developed swelling of her eyes bilaterally, face, and a rash which is pruritic and is located over her face and neck.  She is covered this with makeup today.  She stopped her exemestane on Thursday.  She is used Benadryl without complete resolution of her symptoms.  She has been taking 2 Benadryl every 3 hours.  She noted some tightening of her throat last Wednesday night before her rash started.    Medications: I have reviewed the patient's current medications.  Allergies:  Allergies  Allergen Reactions  . Aleve [Naproxen Sodium] Hives, Itching and Swelling  . Aromasin [Exemestane] Anaphylaxis, Itching and Swelling    Pt currently tolerating medication well (02/03/16)-gwd; Pt stated "I can take this medication for a few months at a time and I take a benadryl when I feel it  starting to come on" Patient has facial swelling, itching, and throat swelling  07/12/17  Taking this right now and no problem. sy/rn  . Letrozole Shortness Of Breath, Rash and Other (See Comments)    Swelling - feet, eyes,hands;   Speech garbled  . Levofloxacin Rash and Swelling    Facial swelling and red facial rash  . Penicillins Hives, Swelling and Rash    All over the body. Has patient had a PCN reaction causing immediate rash, facial/tongue/throat swelling, SOB or lightheadedness with hypotension: Yes Has patient had a PCN reaction causing severe rash involving mucus membranes or skin necrosis: No Has patient had a PCN reaction that required hospitalization No Has patient had a PCN reaction occurring within the last 10 years: No If all of the above answers are "NO", then may proceed with Cephalosporin use.   . Symbicort [Budesonide-Formoterol Fumarate] Anaphylaxis, Hives, Swelling and Rash  . Codeine Other (See Comments)    hallucinations  . Other Swelling    Swelling of  Eyes   Wheat ,oranges banana, zucchini,cayenne,chili  Peppers, sweet potatoes, pumpkin  07/12/17 no problem at this time.   Marland Kitchen Percocet [Oxycodone-Acetaminophen] Other (See Comments)    Causes syncope day after medication has been taken.  . Vicodin [Hydrocodone-Acetaminophen] Other (See Comments)    Causes syncope day after medication has been taken.  . Prednisone Other (See Comments)    Unknown  . Cefdinir Rash and Swelling    Past Medical History:  Diagnosis  Date  . Abdominal pain   . Breast cancer ([place]) 06/2013   left upper inner  . Cancer ([place])    breast  . Diabetes mellitus without complication ([place])    Takes Tradjenta  . Family history of adverse reaction to anesthesia    Patients mother had to be resusciated after having anesthesia  . GERD (gastroesophageal reflux disease)   . Headache(784.0)    Takes Depakote  . Hernia, incisional periumbilical, incarcerated 3/33/5456  . Hot flashes   . Hx of  radiation therapy 09/09/13- 10/13/13   left breast 5000 cGy in 25 sessions, no boost  . Hyperlipidemia   . Hypertension    PCP DR Nolon Rod  . Hypothyroidism   . Peripheral vascular disease ([place])    pulmonary embolis-still have some  . Post concussion syndrome    4 yrs ago- sees [facility]- Dr Leonie Man every 6 months-   has sleep study 2011 following accident but was neg per patient-  short term memory issues, dates and times  . Pulmonary embolism ([place]) 2013  . Recurrent upper respiratory infection (URI)    FLU 08/23/11-08/29/11 then URI 08/29/11- 09/08/11- S/P zpack and steroids-  improved      no cough or congestion  . Thyroid disease   . 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 Ventral hernia     Past Surgical History:  Procedure Laterality Date  . APPENDECTOMY  2002   lap appy.  Dr. Hassell Done  . BREAST LUMPECTOMY Left 2015   radiation  . BREAST LUMPECTOMY WITH NEEDLE LOCALIZATION AND AXILLARY SENTINEL LYMPH NODE BX Left 08/04/2013   Procedure: BREAST LUMPECTOMY WITH NEEDLE LOCALIZATION AND AXILLARY SENTINEL LYMPH NODE Biopsy x 3;  Surgeon: Rolm Bookbinder, MD;  Location: [facility];  Service: General;  Laterality: Left;  . BREAST SURGERY    . CARPAL TUNNEL RELEASE  10/2009-12/2010   left - right  . CESAREAN SECTION  X 2  . COLONOSCOPY    . HERNIA REPAIR  09/15/11   Ventral w/mesh  . JOINT REPLACEMENT    . KNEE ARTHROPLASTY Right 12/22/2015   Procedure: COMPUTER ASSISTED TOTAL KNEE ARTHROPLASTY;  Surgeon: Marybelle Killings, MD;  Location: [facility];  Service: Orthopedics;  Laterality: Right;  . LAPAROSCOPIC CHOLECYSTECTOMY  2006   Dr. Bubba Camp  . TARSAL TUNNEL RELEASE Bilateral   . THYROIDECTOMY, PARTIAL    . VENTRAL HERNIA REPAIR  09/29/2011   Procedure: LAPAROSCOPIC VENTRAL HERNIA;  Surgeon: Adin Hector, MD;  Location: [facility];  Service: General;  Laterality: N/A;  Laparoscopic Ventral Wall Hernia Repair with Mesh    Family History  Problem Relation Age of Onset  .  Malignant hyperthermia Mother   . Heart attack Father   . Stroke Father   . Diabetes Unknown        Grandmother  . Other Unknown        respiratory- Grandmother  . Cancer Maternal Grandmother 54       breast    Social History   Socioeconomic History  . Marital status: Divorced    Spouse name: Not on file  . Number of children: 2  . Years of education: Master's  . Highest education level: Not on file  Occupational History    Employer: [employer]  Social Needs  . Financial resource strain: Not on file  . Food insecurity:    Worry: Not on file    Inability: Not on file  . Transportation needs:  Medical: Not on file    Non-medical: Not on file  Tobacco Use  . Smoking status: Never Smoker  . Smokeless tobacco: Never Used  Substance and Sexual Activity  . Alcohol use: No    Alcohol/week: 0.6 oz    Types: 1 Glasses of wine per week  . Drug use: No  . Sexual activity: Yes    Comment: Menarche age 71, first live birth age 27,  [place] P2. She stopped having periods approximately 1999 and took HRT until 2005.  Lifestyle  . Physical activity:    Days per week: Not on file    Minutes per session: Not on file  . Stress: Not on file  Relationships  . Social connections:    Talks on phone: Not on file    Gets together: Not on file    Attends religious service: Not on file    Active member of club or organization: Not on file    Attends meetings of clubs or organizations: Not on file    Relationship status: Not on file  . Intimate partner violence:    Fear of current or ex partner: Not on file    Emotionally abused: Not on file    Physically abused: Not on file    Forced sexual activity: Not on file  Other Topics Concern  . Not on file  Social History Narrative   Patient is divorced and lives alone.   Patient has two children.   Patient is a Pharmacist, hospital in [employer].   Patient has a Master's degree.   Patient drinks 3-4 cups of caffeine daily.     Patient is right handed.    Past Medical History, Surgical history, Social history, and Family history were reviewed and updated as appropriate.   Please see review of systems for further details on the patient's review from today.   Review of Systems:  Review of Systems  Constitutional: Negative for chills, diaphoresis and fever.  HENT: Positive for facial swelling. Negative for sore throat and trouble swallowing.        Mild tightening of the throat last Wednesday.  Respiratory: Negative for cough, choking, chest tightness, shortness of breath, wheezing and stridor.   Cardiovascular: Negative for chest pain and palpitations.  Skin: Positive for rash.    Objective:   Physical Exam:  BP (!) 144/72 (BP Location: Right Arm, Patient Position: Sitting) Comment: Liza RN is aware  Pulse 64   Temp 98.5 F (36.9 C) (Oral)   Resp 20   Ht 5' 2"  (1.575 m)   Wt 197 lb 6.4 oz (89.5 kg)   SpO2 97%   BMI 36.10 kg/m  ECOG: 0  Physical Exam  Constitutional: No distress.  HENT:  Head: Atraumatic.  Eyes: Conjunctivae are normal. Right eye exhibits no discharge. Left eye exhibits no discharge. No scleral icterus.  Neck: Normal range of motion. Neck supple.  Cardiovascular: Normal rate, regular rhythm and normal heart sounds. Exam reveals no gallop and no friction rub.  No murmur heard. Pulmonary/Chest: Effort normal and breath sounds normal. No stridor. No respiratory distress. She has no wheezes. She has no rales.  Lymphadenopathy:    She has no cervical adenopathy.  Neurological: She is alert. Coordination normal.  Skin: Skin is warm. Rash noted. She is not diaphoretic.  There are a few scattered wheals over the patient's neck bilaterally.  These are greater on the left than right.    Lab Review:     Component Value  Date/Time   NA 140 11/05/2017 1047   NA 140 07/27/2016 1450   K 3.8 11/05/2017 1047   K 3.4 (L) 07/27/2016 1450   CL 100 11/05/2017 1047   CO2 29 11/05/2017 1047    CO2 29 07/27/2016 1450   GLUCOSE 92 11/05/2017 1047   GLUCOSE 85 07/27/2016 1450   BUN 19 11/05/2017 1047   BUN 15.4 07/27/2016 1450   CREATININE 0.99 11/05/2017 1047   CREATININE 0.8 07/27/2016 1450   CALCIUM 10.2 11/05/2017 1047   CALCIUM 9.8 07/27/2016 1450   PROT 8.1 11/05/2017 1047   PROT 7.4 07/27/2016 1450   ALBUMIN 4.3 11/05/2017 1047   ALBUMIN 4.1 07/27/2016 1450   AST 27 11/05/2017 1047   AST 21 07/27/2016 1450   ALT 50 11/05/2017 1047   ALT 27 07/27/2016 1450   ALKPHOS 62 11/05/2017 1047   ALKPHOS 59 07/27/2016 1450   BILITOT 0.6 11/05/2017 1047   BILITOT 0.75 07/27/2016 1450   GFRNONAA 59 (L) 11/05/2017 1047   GFRAA >60 11/05/2017 1047       Component Value Date/Time   WBC 6.0 11/05/2017 1047   RBC 5.31 11/05/2017 1047   HGB 15.0 11/05/2017 1047   HGB 13.9 07/27/2016 1450   HCT 46.1 11/05/2017 1047   HCT 41.9 07/27/2016 1450   PLT 246 11/05/2017 1047   PLT 238 07/27/2016 1450   MCV 86.8 11/05/2017 1047   MCV 86.0 07/27/2016 1450   MCH 28.2 11/05/2017 1047   MCHC 32.5 11/05/2017 1047   RDW 13.8 11/05/2017 1047   RDW 13.8 07/27/2016 1450   LYMPHSABS 2.5 11/05/2017 1047   LYMPHSABS 1.9 07/27/2016 1450   MONOABS 0.6 11/05/2017 1047   MONOABS 0.4 07/27/2016 1450   EOSABS 0.1 11/05/2017 1047   EOSABS 0.1 07/27/2016 1450   BASOSABS 0.0 11/05/2017 1047   BASOSABS 0.1 07/27/2016 1450   -------------------------------  Imaging from last 24 hours (if applicable):  Radiology interpretation: No results found.

## 2017-11-07 NOTE — Patient Instructions (Signed)
Rash A rash is a change in the color of the skin. A rash can also change the way your skin feels. There are many different conditions and factors that can cause a rash. Follow these instructions at home: Pay attention to any changes in your symptoms. Follow these instructions to help with your condition: Medicine Take or apply over-the-counter and prescription medicines only as told by your doctor. These may include:  Corticosteroid cream.  Anti-itch lotions.  Oral antihistamines.  Skin Care  Put cool compresses on the affected areas.  Try taking a bath with: ? Epsom salts. Follow the instructions on the packaging. You can get these at your local pharmacy or grocery store. ? Baking soda. Pour a small amount into the bath as told by your doctor. ? Colloidal oatmeal. Follow the instructions on the packaging. You can get this at your local pharmacy or grocery store.  Try putting baking soda paste onto your skin. Stir water into baking soda until it gets like a paste.  Do not scratch or rub your skin.  Avoid covering the rash. Make sure the rash is exposed to air as much as possible. General instructions  Avoid hot showers or baths, which can make itching worse. A cold shower may help.  Avoid scented soaps, detergents, and perfumes. Use gentle soaps, detergents, perfumes, and other cosmetic products.  Avoid anything that causes your rash. Keep a journal to help track what causes your rash. Write down: ? What you eat. ? What cosmetic products you use. ? What you drink. ? What you wear. This includes jewelry.  Keep all follow-up visits as told by your doctor. This is important. Contact a doctor if:  You sweat at night.  You lose weight.  You pee (urinate) more than normal.  You feel weak.  You throw up (vomit).  Your skin or the whites of your eyes look yellow (jaundice).  Your skin: ? Tingles. ? Is numb.  Your rash: ? Does not go away after a few days. ? Gets  worse.  You are: ? More thirsty than normal. ? More tired than normal.  You have: ? New symptoms. ? Pain in your belly (abdomen). ? A fever. ? Watery poop (diarrhea). Get help right away if:  Your rash covers all or most of your body. The rash may or may not be painful.  You have blisters that: ? Are on top of the rash. ? Grow larger. ? Grow together. ? Are painful. ? Are inside your nose or mouth.  You have a rash that: ? Looks like purple pinprick-sized spots all over your body. ? Has a "bull's eye" or looks like a target. ? Is red and painful, causes your skin to peel, and is not from being in the sun too long. This information is not intended to replace advice given to you by your health care provider. Make sure you discuss any questions you have with your health care provider. Document Released: 12/27/2007 Document Revised: 12/16/2015 Document Reviewed: 11/25/2014 Elsevier Interactive Patient Education  2018 [employer].

## 2017-11-09 NOTE — Progress Notes (Signed)
[facility] Progress Note   Alyssa [name] [id number] [birth year] 67 y.o.  Alyssa [name] is managed by Dr. Jana Hakim  Actively treated with chemotherapy: no  Current Therapy: Exemestane   Assessment: Plan:    Rash   Rash of the neck and face: Patient given a Medrol Dosepak.  She will continue the Zyrtec or Allegra as needed.  She will return for follow-up as needed.  Please see After Visit Summary for patient specific instructions.  Future Appointments  Date Time Provider [place]  07/15/2018  9:45 AM Dennie Bible, NP [facility] None    No orders of the defined types were placed in this encounter.      Subjective:   Patient ID:  Alyssa [name] is a 67 y.o. (DOB Jan 15, 1951) female.  Chief Complaint:  Chief Complaint  Patient presents with  . Rash    HPI Alyssa [name] is a 67 year old female with a history of an early stage ER positive left breast cancer who is currently treated with exemestane under the care of Dr. Jana Hakim.  She returns having most recently been seen on 11/05/2017 for a rash on her neck and face.  At the time of last visit she was treated with dexamethasone 20 mg IM.  She initially stated that she felt some better.  She is now having itching of her hands and throat, swelling in her face, a rash over her head, neck, chest, and arms, and she reports that she has had to use her inhaler due to some shortness of breath.  Medications: I have reviewed the patient's current medications.  Allergies:  Allergies  Allergen Reactions  . Aleve [Naproxen Sodium] Hives, Itching and Swelling  . Aromasin [Exemestane] Anaphylaxis, Itching and Swelling    Pt currently tolerating medication well (02/03/16)-gwd; Pt stated "I can take this medication for a few months at a time and I take a benadryl when I feel it starting to come on" Patient has facial swelling, itching, and throat swelling  07/12/17  Taking this right now and no problem.  sy/rn  . Letrozole Shortness Of Breath, Rash and Other (See Comments)    Swelling - feet, eyes,hands;   Speech garbled  . Levofloxacin Rash and Swelling    Facial swelling and red facial rash  . Penicillins Hives, Swelling and Rash    All over the body. Has patient had a PCN reaction causing immediate rash, facial/tongue/throat swelling, SOB or lightheadedness with hypotension: Yes Has patient had a PCN reaction causing severe rash involving mucus membranes or skin necrosis: No Has patient had a PCN reaction that required hospitalization No Has patient had a PCN reaction occurring within the last 10 years: No If all of the above answers are "NO", then may proceed with Cephalosporin use.   . Symbicort [Budesonide-Formoterol Fumarate] Anaphylaxis, Hives, Swelling and Rash  . Codeine Other (See Comments)    hallucinations  . Other Swelling    Swelling of  Eyes   Wheat ,oranges banana, zucchini,cayenne,chili  Peppers, sweet potatoes, pumpkin  07/12/17 no problem at this time.   Marland Kitchen Percocet [Oxycodone-Acetaminophen] Other (See Comments)    Causes syncope day after medication has been taken.  . Vicodin [Hydrocodone-Acetaminophen] Other (See Comments)    Causes syncope day after medication has been taken.  . Prednisone Other (See Comments)    Unknown  . Cefdinir Rash and Swelling    Past Medical History:  Diagnosis Date  . Abdominal pain   . Breast cancer (  [place]) 06/2013   left upper inner  . Cancer ([place])    breast  . Diabetes mellitus without complication ([place])    Takes Tradjenta  . Family history of adverse reaction to anesthesia    Patients mother had to be resusciated after having anesthesia  . GERD (gastroesophageal reflux disease)   . Headache(784.0)    Takes Depakote  . Hernia, incisional periumbilical, incarcerated 3/66/4403  . Hot flashes   . Hx of radiation therapy 09/09/13- 10/13/13   left breast 5000 cGy in 25 sessions, no boost  . Hyperlipidemia   . Hypertension     PCP DR Nolon Rod  . Hypothyroidism   . Peripheral vascular disease ([place])    pulmonary embolis-still have some  . Post concussion syndrome    4 yrs ago- sees [facility]- Dr Leonie Man every 6 months-   has sleep study 2011 following accident but was neg per patient-  short term memory issues, dates and times  . Pulmonary embolism ([place]) 2013  . Recurrent upper respiratory infection (URI)    FLU 08/23/11-08/29/11 then URI 08/29/11- 09/08/11- S/P zpack and steroids-  improved      no cough or congestion  . Thyroid disease   . 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 Ventral hernia     Past Surgical History:  Procedure Laterality Date  . APPENDECTOMY  2002   lap appy.  Dr. Hassell Done  . BREAST LUMPECTOMY Left 2015   radiation  . BREAST LUMPECTOMY WITH NEEDLE LOCALIZATION AND AXILLARY SENTINEL LYMPH NODE BX Left 08/04/2013   Procedure: BREAST LUMPECTOMY WITH NEEDLE LOCALIZATION AND AXILLARY SENTINEL LYMPH NODE Biopsy x 3;  Surgeon: Rolm Bookbinder, MD;  Location: [facility];  Service: General;  Laterality: Left;  . BREAST SURGERY    . CARPAL TUNNEL RELEASE  10/2009-12/2010   left - right  . CESAREAN SECTION  X 2  . COLONOSCOPY    . HERNIA REPAIR  09/15/11   Ventral w/mesh  . JOINT REPLACEMENT    . KNEE ARTHROPLASTY Right 12/22/2015   Procedure: COMPUTER ASSISTED TOTAL KNEE ARTHROPLASTY;  Surgeon: Marybelle Killings, MD;  Location: [facility];  Service: Orthopedics;  Laterality: Right;  . LAPAROSCOPIC CHOLECYSTECTOMY  2006   Dr. Bubba Camp  . TARSAL TUNNEL RELEASE Bilateral   . THYROIDECTOMY, PARTIAL    . VENTRAL HERNIA REPAIR  09/29/2011   Procedure: LAPAROSCOPIC VENTRAL HERNIA;  Surgeon: Adin Hector, MD;  Location: [facility];  Service: General;  Laterality: N/A;  Laparoscopic Ventral Wall Hernia Repair with Mesh    Family History  Problem Relation Age of Onset  . Malignant hyperthermia Mother   . Heart attack Father   . Stroke Father   . Diabetes Unknown        Grandmother  . Other Unknown         respiratory- Grandmother  . Cancer Maternal Grandmother 82       breast    Social History   Socioeconomic History  . Marital status: Divorced    Spouse name: Not on file  . Number of children: 2  . Years of education: Master's  . Highest education level: Not on file  Occupational History    Employer: [employer]  Social Needs  . Financial resource strain: Not on file  . Food insecurity:    Worry: Not on file    Inability: Not on file  . Transportation needs:    Medical: Not on file    Non-medical: Not on  file  Tobacco Use  . Smoking status: Never Smoker  . Smokeless tobacco: Never Used  Substance and Sexual Activity  . Alcohol use: No    Alcohol/week: 0.6 oz    Types: 1 Glasses of wine per week  . Drug use: No  . Sexual activity: Yes    Comment: Menarche age 58, first live birth age 65,  [place] P2. She stopped having periods approximately 1999 and took HRT until 2005.  Lifestyle  . Physical activity:    Days per week: Not on file    Minutes per session: Not on file  . Stress: Not on file  Relationships  . Social connections:    Talks on phone: Not on file    Gets together: Not on file    Attends religious service: Not on file    Active member of club or organization: Not on file    Attends meetings of clubs or organizations: Not on file    Relationship status: Not on file  . Intimate partner violence:    Fear of current or ex partner: Not on file    Emotionally abused: Not on file    Physically abused: Not on file    Forced sexual activity: Not on file  Other Topics Concern  . Not on file  Social History Narrative   Patient is divorced and lives alone.   Patient has two children.   Patient is a Pharmacist, hospital in [place].   Patient has a Master's degree.   Patient drinks 3-4 cups of caffeine daily.    Patient is right handed.    Past Medical History, Surgical history, Social history, and Family history were reviewed and  updated as appropriate.   Please see review of systems for further details on the patient's review from today.   Review of Systems:  Review of Systems  HENT: Negative for trouble swallowing.   Respiratory: Positive for shortness of breath. Negative for cough, choking, chest tightness, wheezing and stridor.   Cardiovascular: Negative for chest pain and leg swelling.  Skin: Positive for rash.    Objective:   Physical Exam:  BP (!) 115/59 (BP Location: Left Arm, Patient Position: Sitting)   Pulse (!) 57   Temp 97.7 F (36.5 C) (Oral)   Resp 18   Ht 5' 2"  (1.575 m)   Wt 200 lb 6.4 oz (90.9 kg)   SpO2 98%   BMI 36.65 kg/m  ECOG: 0  Physical Exam  Constitutional: No distress.  Cardiovascular: Normal rate, regular rhythm and normal heart sounds. Exam reveals no gallop and no friction rub.  No murmur heard. Pulmonary/Chest: No respiratory distress. She has no wheezes. She has no rales.  Skin: Rash noted. She is not diaphoretic. There is erythema.  Mild erythema of the neck and face.  Diffuse wheals of the neck.    Lab Review:     Component Value Date/Time   NA 140 11/05/2017 1047   NA 140 07/27/2016 1450   K 3.8 11/05/2017 1047   K 3.4 (L) 07/27/2016 1450   CL 100 11/05/2017 1047   CO2 29 11/05/2017 1047   CO2 29 07/27/2016 1450   GLUCOSE 92 11/05/2017 1047   GLUCOSE 85 07/27/2016 1450   BUN 19 11/05/2017 1047   BUN 15.4 07/27/2016 1450   CREATININE 0.99 11/05/2017 1047   CREATININE 0.8 07/27/2016 1450   CALCIUM 10.2 11/05/2017 1047   CALCIUM 9.8 07/27/2016 1450   PROT 8.1 11/05/2017 1047   PROT  7.4 07/27/2016 1450   ALBUMIN 4.3 11/05/2017 1047   ALBUMIN 4.1 07/27/2016 1450   AST 27 11/05/2017 1047   AST 21 07/27/2016 1450   ALT 50 11/05/2017 1047   ALT 27 07/27/2016 1450   ALKPHOS 62 11/05/2017 1047   ALKPHOS 59 07/27/2016 1450   BILITOT 0.6 11/05/2017 1047   BILITOT 0.75 07/27/2016 1450   GFRNONAA 59 (L) 11/05/2017 1047   GFRAA >60 11/05/2017 1047         Component Value Date/Time   WBC 6.0 11/05/2017 1047   RBC 5.31 11/05/2017 1047   HGB 15.0 11/05/2017 1047   HGB 13.9 07/27/2016 1450   HCT 46.1 11/05/2017 1047   HCT 41.9 07/27/2016 1450   PLT 246 11/05/2017 1047   PLT 238 07/27/2016 1450   MCV 86.8 11/05/2017 1047   MCV 86.0 07/27/2016 1450   MCH 28.2 11/05/2017 1047   MCHC 32.5 11/05/2017 1047   RDW 13.8 11/05/2017 1047   RDW 13.8 07/27/2016 1450   LYMPHSABS 2.5 11/05/2017 1047   LYMPHSABS 1.9 07/27/2016 1450   MONOABS 0.6 11/05/2017 1047   MONOABS 0.4 07/27/2016 1450   EOSABS 0.1 11/05/2017 1047   EOSABS 0.1 07/27/2016 1450   BASOSABS 0.0 11/05/2017 1047   BASOSABS 0.1 07/27/2016 1450   -------------------------------  Imaging from last 24 hours (if applicable):  Radiology interpretation: No results found.

## 2017-11-10 ENCOUNTER — Other Ambulatory Visit: Payer: Self-pay | Admitting: Internal Medicine

## 2018-04-04 NOTE — Progress Notes (Signed)
@Patient  ID: Alyssa [name], female    DOB: 03-27-1951, 67 y.o.   MRN: [id number]  Chief Complaint  Patient presents with  . Acute Visit    Cough    Referring provider: Nickola Major, MD  HPI:  67 year old female never smoker followed in our office for asthma and chronic cough  PMH: Breast cancer 2014, DM, GERD, HA, HLD, HTN, Hypothyroidism, PAD Smoker/ Smoking History: Never smoker Maintenance: Flovent Pt of: Dr. Halford Chessman  Recent [facility] Encounters:   06/01/2017-office visit-S[place] Patient is feeling much better since last visit.  Cough is improved.  Patient is able to sing again.  She is not taking Chlortab's anymore. Plan: Continue Flovent, Singulair, Flonase, Zyrtec, okay to stop Prilosec, follow-up in 4 months 07/13/2017-telephone encounter-patient reported she needs prescription for omeprazole  04/05/2018  - Visit   67 year old female patient presenting today for acute visit with worsening cough.  Patient reports that this is the same chronic issue that started last year the pain was initially managed well with antihistamines, PPIs, ICS.  Patient reports that her ICS prescription ran out she stopped taking her Flovent.  Patient also reports that she stopped taking her PPIs because they were too expensive to purchase over-the-counter.  Patient is adherent to taking a daily antihistamine of Allegra.   Patient reports that 3 weeks ago the symptoms flared and is been progressively getting worse over the last 3 weeks.  Patient reports she did start over-the-counter PPI this week because symptoms of gotten so bad.  Patient reports that she does not have feelings of indigestion but is having a persistently worse dry cough.  See cough ROS below.  Patient reports impeccable diet with no sugar, flour, no gluten, no additives.  Patient is having a whole 30 or nutritious diet for now she describes.  Unfortunately patient has been having an increased amount of carbonated seltzer  beverages.   Tests:   Pulmonary tests CT angio chest 10/07/11 >> large PE  FeNO 04/30/17 >> 14   Chart Review:     Specialty Problems      Pulmonary Problems   Acute pulmonary embolism (HCC)   Allergic rhinitis   Upper airway cough syndrome    FeNO 04/30/17 >> 14  Per documentation-  Cough was initially controlled with ICS, daily antihistamine, daily PPI         Allergies  Allergen Reactions  . Aleve [Naproxen Sodium] Hives, Itching and Swelling  . Aromasin [Exemestane] Anaphylaxis, Itching and Swelling    Pt currently tolerating medication well (02/03/16)-gwd; Pt stated "I can take this medication for a few months at a time and I take a benadryl when I feel it starting to come on" Patient has facial swelling, itching, and throat swelling  07/12/17  Taking this right now and no problem. sy/rn  . Letrozole Shortness Of Breath, Rash and Other (See Comments)    Swelling - feet, eyes,hands;   Speech garbled  . Levofloxacin Rash and Swelling    Facial swelling and red facial rash  . Penicillins Hives, Swelling and Rash    All over the body. Has patient had a PCN reaction causing immediate rash, facial/tongue/throat swelling, SOB or lightheadedness with hypotension: Yes Has patient had a PCN reaction causing severe rash involving mucus membranes or skin necrosis: No Has patient had a PCN reaction that required hospitalization No Has patient had a PCN reaction occurring within the last 10 years: No If all of the above answers are "NO", then may  proceed with Cephalosporin use.   . Symbicort [Budesonide-Formoterol Fumarate] Anaphylaxis, Hives, Swelling and Rash  . Codeine Other (See Comments)    hallucinations  . Percocet [Oxycodone-Acetaminophen] Other (See Comments)    Causes syncope day after medication has been taken.  . Vicodin [Hydrocodone-Acetaminophen] Other (See Comments)    Causes syncope day after medication has been taken.  . Prednisone Other (See Comments)     Unknown  . Cefdinir Rash and Swelling    Immunization History  Administered Date(s) Administered  . Influenza Split 04/30/2016  . Influenza-Unspecified 06/11/2014    Past Medical History:  Diagnosis Date  . Abdominal pain   . Breast cancer ([place]) 06/2013   left upper inner  . Cancer ([place])    breast  . Diabetes mellitus without complication ([place])    Takes Tradjenta  . Family history of adverse reaction to anesthesia    Patients mother had to be resusciated after having anesthesia  . GERD (gastroesophageal reflux disease)   . Headache(784.0)    Takes Depakote  . Hernia, incisional periumbilical, incarcerated 0/35/0093  . Hot flashes   . Hx of radiation therapy 09/09/13- 10/13/13   left breast 5000 cGy in 25 sessions, no boost  . Hyperlipidemia   . Hypertension    PCP DR Nolon Rod  . Hypothyroidism   . Peripheral vascular disease ([place])    pulmonary embolis-still have some  . Post concussion syndrome    4 yrs ago- sees [facility]- Dr Leonie Man every 6 months-   has sleep study 2011 following accident but was neg per patient-  short term memory issues, dates and times  . Pulmonary embolism ([place]) 2013  . Recurrent upper respiratory infection (URI)    FLU 08/23/11-08/29/11 then URI 08/29/11- 09/08/11- S/P zpack and steroids-  improved      no cough or congestion  . Thyroid disease   . 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 Ventral hernia     Tobacco History: Social History   Tobacco Use  Smoking Status Never Smoker  Smokeless Tobacco Never Used   Counseling given: Not Answered  Continue to not smoke  Outpatient Encounter Medications as of 04/05/2018  Medication Sig  . ALPRAZolam (XANAX) 0.5 MG tablet Take 0.5 mg by mouth at bedtime as needed for anxiety or sleep.   Francia Greaves THYROID 90 MG tablet TK 1 T PO QAM ON AN EMPTY STOMACH AND 1/2 T BEFORE LUNCH  . divalproex (DEPAKOTE) 500 MG DR tablet Take 1 tablet (500 mg total) by mouth daily.  Marland Kitchen exemestane  (AROMASIN) 25 MG tablet Take 1 tablet (25 mg total) by mouth daily after breakfast.  . fexofenadine-pseudoephedrine (ALLEGRA-D 24) 180-240 MG 24 hr tablet Take 1 tablet by mouth daily.  . hydrochlorothiazide (HYDRODIURIL) 25 MG tablet Take 25 mg by mouth at bedtime.   Marland Kitchen omeprazole (PRILOSEC) 40 MG capsule Take 1 capsule (40 mg total) by mouth daily.  . [DISCONTINUED] omeprazole (PRILOSEC) 40 MG capsule Take 1 capsule (40 mg total) by mouth daily.  . cetirizine (ZYRTEC ALLERGY) 10 MG tablet Take 1 tablet (10 mg total) by mouth daily. (Patient not taking: Reported on 04/05/2018)  . fluticasone (FLONASE) 50 MCG/ACT nasal spray Place 2 sprays into both nostrils 2 (two) times daily.  . fluticasone (FLOVENT HFA) 110 MCG/ACT inhaler Inhale 2 puffs into the lungs 2 (two) times daily.  . fluticasone (FLOVENT HFA) 44 MCG/ACT inhaler Inhale 2 puffs into the lungs 2 (two) times daily. (Patient not taking: Reported  on 04/05/2018)  . linagliptin (TRADJENTA) 5 MG TABS tablet Take 5 mg by mouth daily.  . methylPREDNISolone (MEDROL DOSEPAK) 4 MG TBPK tablet 6 x 1 day, 5 x 1 day, 4 x 1 day, 3 x 1 day, 2 x 1 day, 1 x 1 day (Patient not taking: Reported on 04/05/2018)  . montelukast (SINGULAIR) 10 MG tablet Take 1 tablet (10 mg total) by mouth at bedtime. (Patient not taking: Reported on 04/05/2018)  . [DISCONTINUED] fluticasone (FLONASE) 50 MCG/ACT nasal spray Reported on 02/03/2016   Facility-Administered Encounter Medications as of 04/05/2018  Medication  . fluticasone (FLOVENT HFA) 44 MCG/ACT inhaler 2 puff     Review of Systems  Review of Systems  Constitutional: Positive for fatigue. Negative for chills, fever and unexpected weight change.  HENT: Positive for congestion, postnasal drip, sinus pressure and voice change (hoarseness ). Negative for ear pain, sinus pain and trouble swallowing.   Respiratory: Positive for cough, chest tightness and shortness of breath. Negative for wheezing.   Cardiovascular:  Negative for chest pain and palpitations.  Gastrointestinal: Negative for blood in stool, diarrhea, nausea and vomiting.  Genitourinary: Negative for dysuria, frequency and urgency.  Musculoskeletal: Negative for arthralgias.  Skin: Negative for color change.  Allergic/Immunologic: Negative for environmental allergies and food allergies.  Neurological: Positive for headaches. Negative for dizziness and light-headedness.  Psychiatric/Behavioral: Negative for dysphoric mood. The patient is not nervous/anxious.   All other systems reviewed and are negative.   Cough ROS:   When to the symptoms start: 2018 symptoms worked on  How are you today: flared 3 weeks ago, getting progressively worse   Have you had fever/sore throat (first 5 to 7 days of URI) or Have you had cough/nasal congestion (10 to 14 days of URI) : +cough / nasal congestion  Have you used anything to treat the cough, as anything improved: omeprazole helped but has been off of it   Is it a dry or wet cough: dry cough   Does the cough happen when your breathing or when you breathe out: Unsure  Other any triggers to your cough, or any aggravating factors: All the time  Daily antihistamine: allegra  GERD treatment: omeprazole - off of it, started this week  Singulair: not taking   Cough checklist (bolded indicates presence):  Adherence, acid reflux, ACE inhibitor, active sinus disease, active smoking, adverse effects of medications (amiodarone/Macrodantin/bb), alpha 1, allergies, aspiration, anxiety, bronchiectasis, congestive heart failure (diastolic)    Physical Exam  BP 140/86 (BP Location: Left Arm, Cuff Size: Normal)   Pulse (!) 56   Temp 97.8 F (36.6 C) (Oral)   Ht 5' 3"  (1.6 m)   Wt 202 lb 6.4 oz (91.8 kg)   SpO2 96%   BMI 35.85 kg/m   Wt Readings from Last 5 Encounters:  04/05/18 202 lb 6.4 oz (91.8 kg)  11/07/17 200 lb 6.4 oz (90.9 kg)  11/05/17 197 lb 6.4 oz (89.5 kg)  08/02/17 207 lb 11.2 oz (94.2  kg)  07/12/17 213 lb (96.6 kg)    Physical Exam  Constitutional: She is oriented to person, place, and time and well-developed, well-nourished, and in no distress. No distress.  HENT:  Head: Normocephalic and atraumatic.  Right Ear: Hearing, tympanic membrane, external ear and ear canal normal.  Left Ear: Hearing, tympanic membrane, external ear and ear canal normal.  Nose: Mucosal edema and rhinorrhea present. Right sinus exhibits no maxillary sinus tenderness and no frontal sinus tenderness. Left sinus exhibits no maxillary  sinus tenderness and no frontal sinus tenderness.  Mouth/Throat: Uvula is midline and oropharynx is clear and moist. No oropharyngeal exudate.  +post nasal drip   Eyes: Pupils are equal, round, and reactive to light.  Neck: Normal range of motion. Neck supple. No JVD present.  Cardiovascular: Normal rate, regular rhythm and normal heart sounds.  Pulmonary/Chest: Effort normal and breath sounds normal. No accessory muscle usage. No respiratory distress. She has no decreased breath sounds. She has no wheezes. She has no rhonchi.  Abdominal: Soft. Bowel sounds are normal. There is no tenderness.  Musculoskeletal: Normal range of motion. She exhibits no edema.  Lymphadenopathy:    She has no cervical adenopathy.  Neurological: She is alert and oriented to person, place, and time. Gait normal.  Skin: Skin is warm and dry. She is not diaphoretic. No erythema.  Psychiatric: Mood, memory, affect and judgment normal.  Nursing note and vitals reviewed.     Lab Results:  CBC    Component Value Date/Time   WBC 6.0 11/05/2017 1047   RBC 5.31 11/05/2017 1047   HGB 15.0 11/05/2017 1047   HGB 13.9 07/27/2016 1450   HCT 46.1 11/05/2017 1047   HCT 41.9 07/27/2016 1450   PLT 246 11/05/2017 1047   PLT 238 07/27/2016 1450   MCV 86.8 11/05/2017 1047   MCV 86.0 07/27/2016 1450   MCH 28.2 11/05/2017 1047   MCHC 32.5 11/05/2017 1047   RDW 13.8 11/05/2017 1047   RDW 13.8  07/27/2016 1450   LYMPHSABS 2.5 11/05/2017 1047   LYMPHSABS 1.9 07/27/2016 1450   MONOABS 0.6 11/05/2017 1047   MONOABS 0.4 07/27/2016 1450   EOSABS 0.1 11/05/2017 1047   EOSABS 0.1 07/27/2016 1450   BASOSABS 0.0 11/05/2017 1047   BASOSABS 0.1 07/27/2016 1450    BMET    Component Value Date/Time   NA 140 11/05/2017 1047   NA 140 07/27/2016 1450   K 3.8 11/05/2017 1047   K 3.4 (L) 07/27/2016 1450   CL 100 11/05/2017 1047   CO2 29 11/05/2017 1047   CO2 29 07/27/2016 1450   GLUCOSE 92 11/05/2017 1047   GLUCOSE 85 07/27/2016 1450   BUN 19 11/05/2017 1047   BUN 15.4 07/27/2016 1450   CREATININE 0.99 11/05/2017 1047   CREATININE 0.8 07/27/2016 1450   CALCIUM 10.2 11/05/2017 1047   CALCIUM 9.8 07/27/2016 1450   GFRNONAA 59 (L) 11/05/2017 1047   GFRAA >60 11/05/2017 1047    BNP No results found for: BNP  ProBNP No results found for: PROBNP  Imaging: No results found.    Assessment & Plan:   66 year old female patient presenting today for acute visit.  Patient has flare of upper airway cough syndrome and GERD exacerbation.  Patient had not been adherent to her plan of care.  Will resume treatment with prescriptions of Flonase as well as omeprazole.  We will also have patient review GERD literature and current diet information listed on discharge paperwork.  Explained to patient the importance of following up with our office if her symptoms worsen, also if her medications run out.  I would like to continue patient being on Flovent ICS, daily antihistamine such as Allegra, Flonase as needed for nasal congestion, and daily PPI such as omeprazole for management of GERD.  Due to patient's chronic cough.  Follow-up in 6 months to see Dr. Halford Chessman or sooner if symptoms worsen  Upper airway cough syndrome Flonase  >>> 1 spray each nostril daily as needed for allergies and  nasal congestion  Can also do nasal saline rinses  Omeprazole 83m  >>> Take daily in the morning on empty  stomach 1 hour before eating taking your other medications >>>You need to take this daily  Flovent 110 HFA >>> 2 puffs in the morning right when you wake up, rinse out your mouth after use, 12 hours later 2 puffs, rinse after use >>> Take this daily, no matter what >>> This is not a rescue inhaler    Okay to receive flu vaccine in October  Follow-up with our office in 6 months with Dr. SHalford Chessman If symptoms worsen or you run out of your medications you need to contact our office   Allergic rhinitis Continue Allegra Flonase  >>> 1 spray each nostril daily as needed for allergies and nasal congestion  Can also do nasal saline rinses  Follow-up with our office in 6 months with Dr. SHalford Chessman If symptoms worsen or you run out of your medications you need to contact our office   GERD (gastroesophageal reflux disease)  Omeprazole 459m >>> Take daily in the morning on empty stomach 1 hour before eating taking your other medications >>>You need to take this daily  Review GERD literature provided on discharge paperwork  Follow-up with our office in 6 months with Dr. SoHalford ChessmanIf symptoms worsen or you run out of your medications you need to contact our office      BrLauraine RinneNP 04/05/2018

## 2018-04-05 ENCOUNTER — Ambulatory Visit: Payer: Medicare Other | Admitting: Pulmonary Disease

## 2018-04-05 ENCOUNTER — Encounter: Payer: Self-pay | Admitting: Pulmonary Disease

## 2018-04-05 DIAGNOSIS — J309 Allergic rhinitis, unspecified: Secondary | ICD-10-CM | POA: Diagnosis not present

## 2018-04-05 DIAGNOSIS — R05 Cough: Secondary | ICD-10-CM

## 2018-04-05 DIAGNOSIS — R058 Other specified cough: Secondary | ICD-10-CM

## 2018-04-05 DIAGNOSIS — K219 Gastro-esophageal reflux disease without esophagitis: Secondary | ICD-10-CM

## 2018-04-05 MED ORDER — OMEPRAZOLE 40 MG PO CPDR: 40.0000 mg | DELAYED_RELEASE_CAPSULE | Freq: Every day | ORAL | 3 refills | Status: DC

## 2018-04-05 MED ORDER — FLUTICASONE PROPIONATE 50 MCG/ACT NA SUSP: 2.0000 | Freq: Two times a day (BID) | NASAL | 3 refills | Status: DC

## 2018-04-05 MED ORDER — FLUTICASONE PROPIONATE HFA 110 MCG/ACT IN AERO: 2.0000 | INHALATION_SPRAY | Freq: Two times a day (BID) | RESPIRATORY_TRACT | 12 refills | Status: DC

## 2018-04-05 NOTE — Assessment & Plan Note (Signed)
  Omeprazole 1m  >>> Take daily in the morning on empty stomach 1 hour before eating taking your other medications >>>You need to take this daily  Review GERD literature provided on discharge paperwork  Follow-up with our office in 6 months with Dr. SHalford Chessman If symptoms worsen or you run out of your medications you need to contact our office

## 2018-04-05 NOTE — Assessment & Plan Note (Signed)
Flonase  >>> 1 spray each nostril daily as needed for allergies and nasal congestion  Can also do nasal saline rinses  Omeprazole 57m  >>> Take daily in the morning on empty stomach 1 hour before eating taking your other medications >>>You need to take this daily  Flovent 110 HFA >>> 2 puffs in the morning right when you wake up, rinse out your mouth after use, 12 hours later 2 puffs, rinse after use >>> Take this daily, no matter what >>> This is not a rescue inhaler    Okay to receive flu vaccine in October  Follow-up with our office in 6 months with Dr. SHalford Chessman If symptoms worsen or you run out of your medications you need to contact our office

## 2018-04-05 NOTE — Assessment & Plan Note (Signed)
Continue Allegra Flonase  >>> 1 spray each nostril daily as needed for allergies and nasal congestion  Can also do nasal saline rinses  Follow-up with our office in 6 months with Dr. Halford Chessman  If symptoms worsen or you run out of your medications you need to contact our office

## 2018-04-05 NOTE — Patient Instructions (Addendum)
Flonase  >>> 1 spray each nostril daily as needed for allergies and nasal congestion  Can also do nasal saline rinses  Omeprazole 17m  >>> Take daily in the morning on empty stomach 1 hour before eating taking your other medications >>>You need to take this daily  Flovent 110 HFA >>> 2 puffs in the morning right when you wake up, rinse out your mouth after use, 12 hours later 2 puffs, rinse after use >>> Take this daily, no matter what >>> This is not a rescue inhaler    Okay to receive flu vaccine in October  Follow-up with our office in 6 months with Dr. SHalford Chessman If symptoms worsen or you run out of your medications you need to contact our office    It is flu season:   >>>Remember to be washing your hands regularly, using hand sanitizer, be careful to use around herself with has contact with people who are sick will increase her chances of getting sick yourself. >>> Best ways to protect herself from the flu: Receive the yearly flu vaccine, practice good hand hygiene washing with soap and also using hand sanitizer when available, eat a nutritious meals, get adequate rest, hydrate appropriately   Please contact the office if your symptoms worsen or you have concerns that you are not improving.   Thank you for choosing [facility] for your healthcare, and for allowing [facility] partner with you on your healthcare journey. I am thankful to be able to provide care to you today.   BWyn QuakerFNP-C         Gastroesophageal Reflux Disease, Adult Normally, food travels down the esophagus and stays in the stomach to be digested. If a person has gastroesophageal reflux disease (GERD), food and stomach acid move back up into the esophagus. When this happens, the esophagus becomes sore and swollen (inflamed). Over time, GERD can make small holes (ulcers) in the lining of the esophagus. Follow these instructions at home: Diet  Follow a diet as told by your doctor. You may need  to avoid foods and drinks such as: ? Coffee and tea (with or without caffeine). ? Drinks that contain alcohol. ? Energy drinks and sports drinks. ? Carbonated drinks or sodas. ? Chocolate and cocoa. ? Peppermint and mint flavorings. ? Garlic and onions. ? Horseradish. ? Spicy and acidic foods, such as peppers, chili powder, curry powder, vinegar, hot sauces, and BBQ sauce. ? Citrus fruit juices and citrus fruits, such as oranges, lemons, and limes. ? Tomato-based foods, such as red sauce, chili, salsa, and pizza with red sauce. ? Fried and fatty foods, such as donuts, french fries, potato chips, and high-fat dressings. ? High-fat meats, such as hot dogs, rib eye steak, sausage, ham, and bacon. ? High-fat dairy items, such as whole milk, butter, and cream cheese.  Eat small meals often. Avoid eating large meals.  Avoid drinking large amounts of liquid with your meals.  Avoid eating meals during the 2-3 hours before bedtime.  Avoid lying down right after you eat.  Do not exercise right after you eat. General instructions  Pay attention to any changes in your symptoms.  Take over-the-counter and prescription medicines only as told by your doctor. Do not take aspirin, ibuprofen, or other NSAIDs unless your doctor says it is okay.  Do not use any tobacco products, including cigarettes, chewing tobacco, and e-cigarettes. If you need help quitting, ask your doctor.  Wear loose clothes. Do not wear anything tight  around your waist.  Raise (elevate) the head of your bed about 6 inches (15 cm).  Try to lower your stress. If you need help doing this, ask your doctor.  If you are overweight, lose an amount of weight that is healthy for you. Ask your doctor about a safe weight loss goal.  Keep all follow-up visits as told by your doctor. This is important. Contact a doctor if:  You have new symptoms.  You lose weight and you do not know why it is happening.  You have trouble  swallowing, or it hurts to swallow.  You have wheezing or a cough that keeps happening.  Your symptoms do not get better with treatment.  You have a hoarse voice. Get help right away if:  You have pain in your arms, neck, jaw, teeth, or back.  You feel sweaty, dizzy, or light-headed.  You have chest pain or shortness of breath.  You throw up (vomit) and your throw up looks like blood or coffee grounds.  You pass out (faint).  Your poop (stool) is bloody or black.  You cannot swallow, drink, or eat. This information is not intended to replace advice given to you by your health care provider. Make sure you discuss any questions you have with your health care provider. Document Released: 12/27/2007 Document Revised: 12/16/2015 Document Reviewed: 11/04/2014 Elsevier Interactive Patient Education  2018 [place] for Gastroesophageal Reflux Disease, Adult When you have gastroesophageal reflux disease (GERD), the foods you eat and your eating habits are very important. Choosing the right foods can help ease your discomfort. What guidelines do I need to follow?  Choose fruits, vegetables, whole grains, and low-fat dairy products.  Choose low-fat meat, fish, and poultry.  Limit fats such as oils, salad dressings, butter, nuts, and avocado.  Keep a food diary. This helps you identify foods that cause symptoms.  Avoid foods that cause symptoms. These may be different for everyone.  Eat small meals often instead of 3 large meals a day.  Eat your meals slowly, in a place where you are relaxed.  Limit fried foods.  Cook foods using methods other than frying.  Avoid drinking alcohol.  Avoid drinking large amounts of liquids with your meals.  Avoid bending over or lying down until 2-3 hours after eating. What foods are not recommended? These are some foods and drinks that may make your symptoms worse: Vegetables Tomatoes. Tomato juice. Tomato and spaghetti  sauce. Chili peppers. Onion and garlic. Horseradish. Fruits Oranges, grapefruit, and lemon (fruit and juice). Meats High-fat meats, fish, and poultry. This includes hot dogs, ribs, ham, sausage, salami, and bacon. Dairy Whole milk and chocolate milk. Sour cream. Cream. Butter. Ice cream. Cream cheese. Drinks Coffee and tea. Bubbly (carbonated) drinks or energy drinks. Condiments Hot sauce. Barbecue sauce. Sweets/Desserts Chocolate and cocoa. Donuts. Peppermint and spearmint. Fats and Oils High-fat foods. This includes [place] fries and potato chips. Other Vinegar. Strong spices. This includes black pepper, white pepper, red pepper, cayenne, curry powder, cloves, ginger, and chili powder. The items listed above may not be a complete list of foods and drinks to avoid. Contact your dietitian for more information. This information is not intended to replace advice given to you by your health care provider. Make sure you discuss any questions you have with your health care provider. Document Released: 01/09/2012 Document Revised: 12/16/2015 Document Reviewed: 05/14/2013 Elsevier Interactive Patient Education  2017 [employer].

## 2018-04-09 NOTE — Progress Notes (Signed)
Reviewed and agree with assessment/plan.   Lenell Lama, MD [facility] 07/19/2016, 12:24 PM Pager:  [phone]  

## 2018-05-11 IMAGING — CR DG CHEST 2V
2 series · 2 of 2 positions shown · non-contrast
Comparison: None.

CLINICAL DATA: Preoperative study.

EXAM:
CHEST  2 VIEW

[w chest pa]
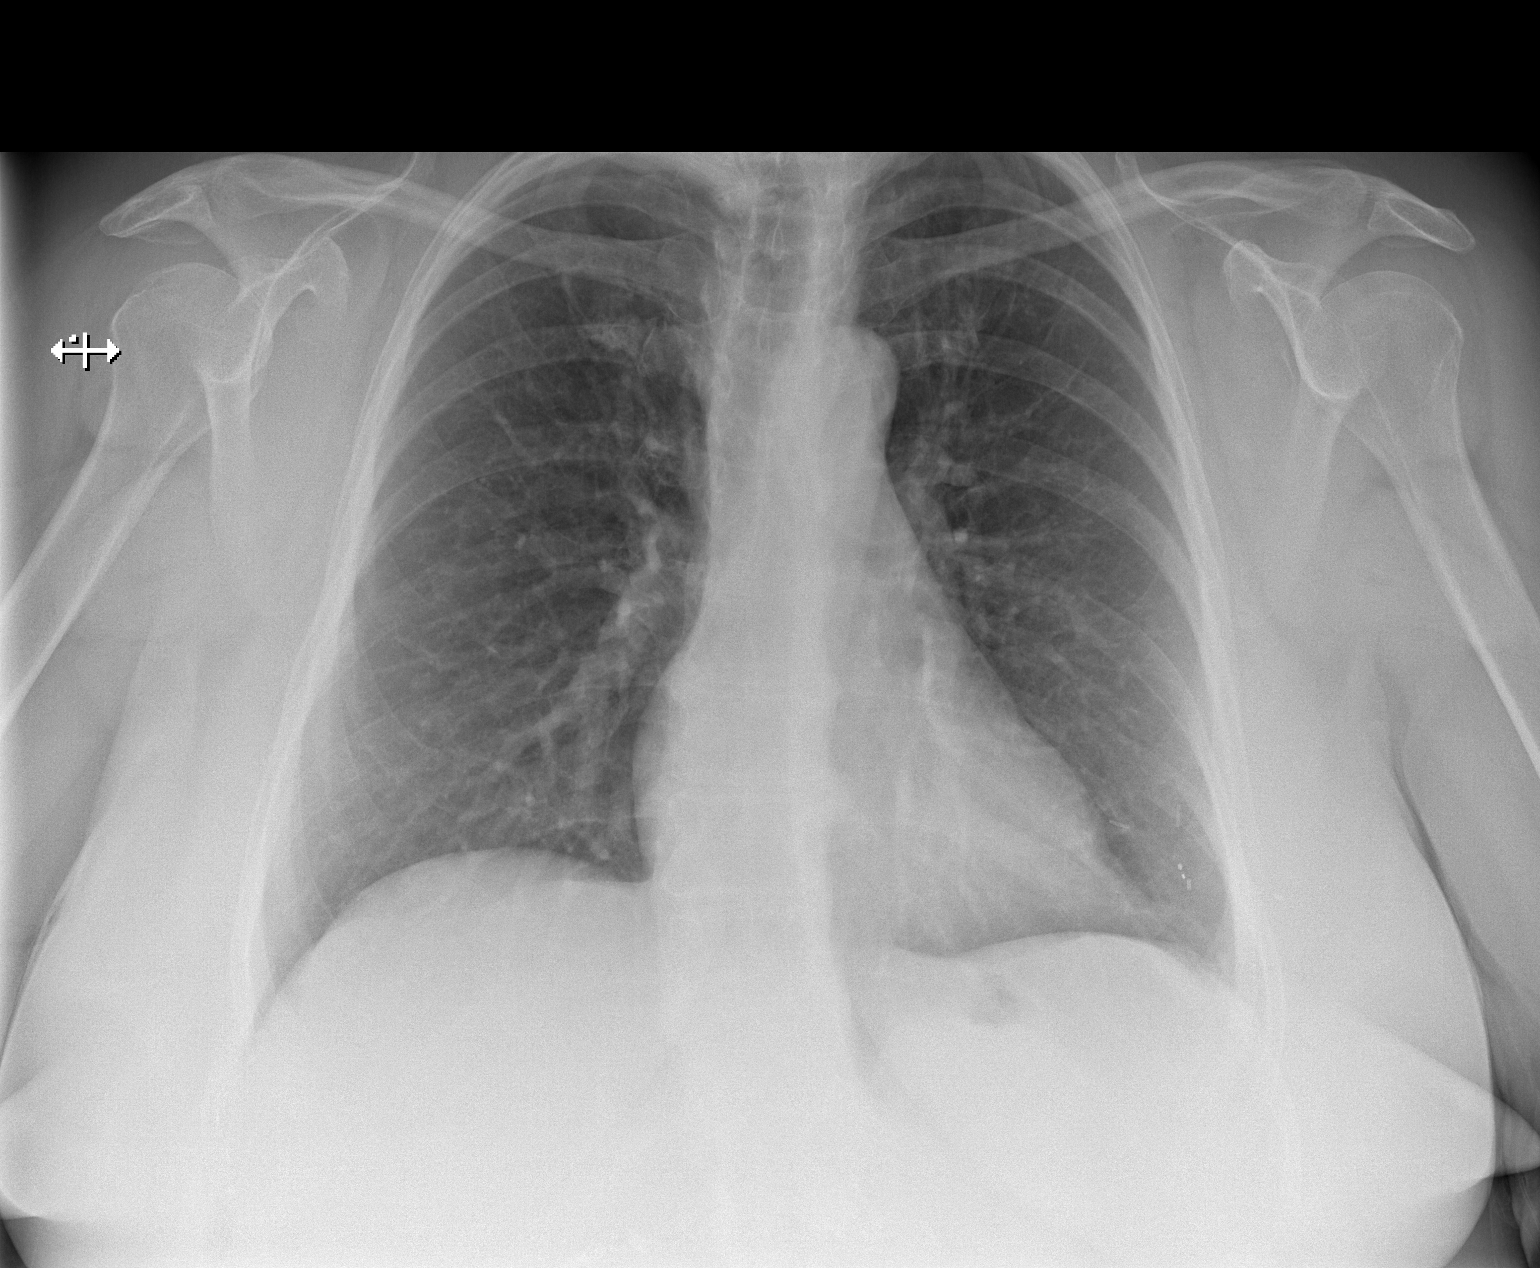

[w chest lat]
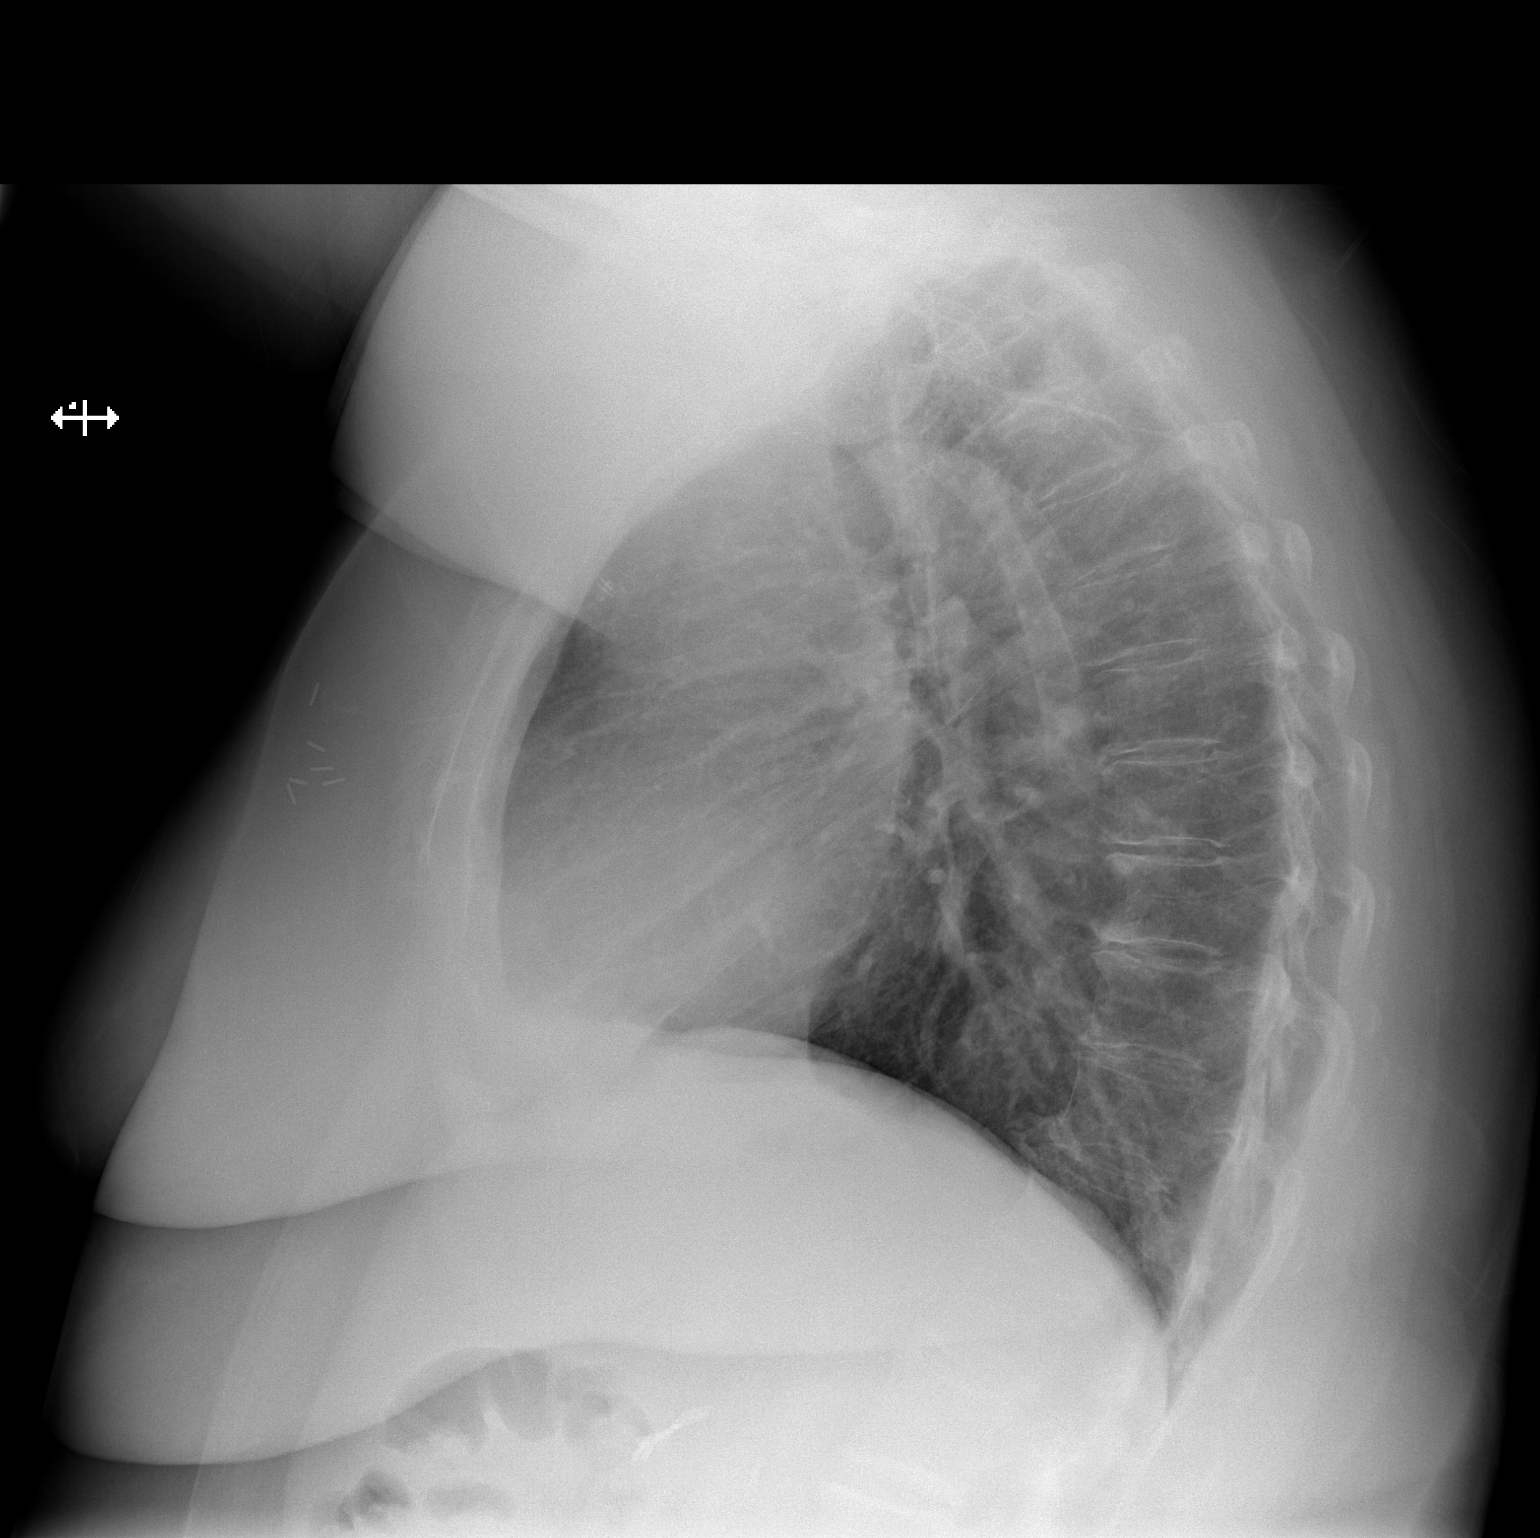

[2 of 2 positions shown; findings below may reference images not displayed]

FINDINGS: Preoperative clips overlie the left breast. The heart, hila,
mediastinum, lungs, and pleura are normal.
IMPRESSION: No active cardiopulmonary disease.

## 2018-06-06 ENCOUNTER — Telehealth: Payer: Self-pay

## 2018-06-06 NOTE — Telephone Encounter (Signed)
SENT REFERRAL TO SCHEDULING AND FILED NOTES 

## 2018-06-10 ENCOUNTER — Other Ambulatory Visit: Payer: Self-pay | Admitting: Oncology

## 2018-06-10 DIAGNOSIS — Z9889 Other specified postprocedural states: Secondary | ICD-10-CM

## 2018-06-11 ENCOUNTER — Ambulatory Visit (INDEPENDENT_AMBULATORY_CARE_PROVIDER_SITE_OTHER): Payer: Self-pay

## 2018-06-11 ENCOUNTER — Encounter (INDEPENDENT_AMBULATORY_CARE_PROVIDER_SITE_OTHER): Payer: Self-pay | Admitting: Orthopaedic Surgery

## 2018-06-11 ENCOUNTER — Other Ambulatory Visit (INDEPENDENT_AMBULATORY_CARE_PROVIDER_SITE_OTHER): Payer: Self-pay

## 2018-06-11 ENCOUNTER — Ambulatory Visit (INDEPENDENT_AMBULATORY_CARE_PROVIDER_SITE_OTHER): Payer: Medicare Other | Admitting: Orthopaedic Surgery

## 2018-06-11 VITALS — BP 136/71 | HR 55 | Ht 62.0 in | Wt 199.0 lb

## 2018-06-11 DIAGNOSIS — M4722 Other spondylosis with radiculopathy, cervical region: Secondary | ICD-10-CM

## 2018-06-11 DIAGNOSIS — R51 Headache: Principal | ICD-10-CM

## 2018-06-11 DIAGNOSIS — R519 Headache, unspecified: Secondary | ICD-10-CM

## 2018-06-11 DIAGNOSIS — M542 Cervicalgia: Secondary | ICD-10-CM

## 2018-06-11 DIAGNOSIS — M4807 Spinal stenosis, lumbosacral region: Secondary | ICD-10-CM

## 2018-06-12 ENCOUNTER — Telehealth (INDEPENDENT_AMBULATORY_CARE_PROVIDER_SITE_OTHER): Payer: Self-pay | Admitting: Orthopaedic Surgery

## 2018-06-12 MED ORDER — MELOXICAM 15 MG PO TABS: 15.0000 mg | ORAL_TABLET | Freq: Every day | ORAL | 0 refills | Status: DC | PRN

## 2018-06-12 NOTE — Telephone Encounter (Signed)
Ok, thanks.

## 2018-06-12 NOTE — Telephone Encounter (Signed)
Please advise. OK for refill?

## 2018-06-12 NOTE — Telephone Encounter (Signed)
Patient called needing Rx refilled (Meloxicam) Patient uses the [facility] in [place].  The number to contact patient is [phone]

## 2018-06-12 NOTE — Telephone Encounter (Signed)
Rx sent to pharmacy.  I tried to reach patient to advise, mailbox is full.

## 2018-06-13 ENCOUNTER — Encounter (INDEPENDENT_AMBULATORY_CARE_PROVIDER_SITE_OTHER): Payer: Self-pay | Admitting: Orthopaedic Surgery

## 2018-06-13 DIAGNOSIS — M4722 Other spondylosis with radiculopathy, cervical region: Secondary | ICD-10-CM | POA: Insufficient documentation

## 2018-06-13 NOTE — Progress Notes (Signed)
Office Visit Note   Patient: Alyssa [name]           Date of Birth: 1951/04/28           MRN: [id number] Visit Date: 06/11/2018              Requested by: Nickola Major, MD [address] [place], [place] [address] PCP: Nickola Major, MD   Assessment & Plan: Visit Diagnoses:  1. Neck pain     Plan: Patient's x-rays show spondylosis C5-6 C6-7 she has brachial plexus tenderness and progressive weakness numbness in her hands.  Despite her lack of significant neck pain I recommend proceeding with cervical MRI scan for evaluation of cervical spondylosis.  He does have a history of breast cancer.  She denies associated night pain.  Follow-up after cervical MRI scan.  Follow-Up Instructions: No follow-ups on file.   Orders:  Orders Placed This Encounter  Procedures  . XR Cervical Spine 2 or 3 views   No orders of the defined types were placed in this encounter.     Procedures: No procedures performed   Clinical Data: No additional findings.   Subjective: Chief Complaint  Patient presents with  . Right Hand - Pain  . Left Hand - Pain    HPI 67 year old female seen with bilateral hand pain right greater than left with pain along the radial side of right-handed weakness in her hand.  She is had difficulty holding a cup has problems caring objects.  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  Able to write use a pencil easily.  She denies neck pain but has had progressive numbness over the last few months.  Previous carpal tunnel release on the right 2011 and on the left 2012.  She has not noticed any pain with rotation no lower extremity weakness numbness no associated bowel or bladder symptoms no fever.  Review of Systems 14 point review of systems updated unchanged from 03/01/2017 other than as mentioned in HPI.  Of note his history of left breast cancer, knee osteoarthritis and history of acute pulmonary embolism with head concussion.   Objective: Vital  Signs: BP 136/71   Pulse (!) 55   Ht 5' 2"  (1.575 m)   Wt 199 lb (90.3 kg)   BMI 36.40 kg/m   Physical Exam  Constitutional: She is oriented to person, place, and time. She appears well-developed.  HENT:  Head: Normocephalic.  Right Ear: External ear normal.  Left Ear: External ear normal.  Eyes: Pupils are equal, round, and reactive to light.  Neck: No tracheal deviation present. No thyromegaly present.  Cardiovascular: Normal rate.  Pulmonary/Chest: Effort normal.  Abdominal: Soft.  Neurological: She is alert and oriented to person, place, and time.  Skin: Skin is warm and dry.  Psychiatric: She has a normal mood and affect. Her behavior is normal.    Ortho Exam well-healed right and left carpal tunnel releases.  No thenar atrophy.  Some decreased sensation C6 distribution dorsal radial hand right greater than left.  Reflexes are intact.  No pain with cervical compression no relief with distraction she does have bilateral brachial plexus tenderness.  No lower extremity clonus no hyperreflexia.  Normal gait pattern. Specialty Comments:  No specialty comments available.  Imaging:AP lateral cervical spine x-rays are obtained and reviewed.  This shows disc space narrowing and spurring at C5-6 C6-7 with uncovertebral degenerative changes and considerable facet degenerative changes.  Cervical lordosis is maintained.  Impression:  Multilevel cervical spondylosis worse at C5-6 and C6-7.     PMFS History: Patient Active Problem List   Diagnosis Date Noted  . GERD (gastroesophageal reflux disease) 04/05/2018  . Allergic rhinitis 04/05/2018  . Upper airway cough syndrome 04/05/2018  . Patellar tendinitis, right knee 03/01/2017  . Status post total right knee replacement 12/22/2015  . Hepatic steatosis 02/09/2015  . Hot flashes 09/09/2014  . Osteopenia 09/09/2014  . Malignant neoplasm of upper-inner quadrant of left breast in female, estrogen receptor positive ([place]) 07/23/2013  .  Memory loss 07/14/2013  . Headache 07/14/2013  . Other malaise and fatigue 07/14/2013  . Acute pulmonary embolism ([place]) 10/07/2011  . Leukocytosis 10/07/2011  . Thyroid mass, left inferior lobe, 3.6cm [id number] 10/07/2011  . Hyponatremia 10/07/2011  . Obesity (BMI 30-39.9) 08/07/2011  . Post concussion syndrome    Past Medical History:  Diagnosis Date  . Abdominal pain   . Breast cancer ([place]) 06/2013   left upper inner  . Cancer ([place])    breast  . Diabetes mellitus without complication ([place])    Takes Tradjenta  . Family history of adverse reaction to anesthesia    Patients mother had to be resusciated after having anesthesia  . GERD (gastroesophageal reflux disease)   . Headache(784.0)    Takes Depakote  . Hernia, incisional periumbilical, incarcerated 4/33/2951  . Hot flashes   . Hx of radiation therapy 09/09/13- 10/13/13   left breast 5000 cGy in 25 sessions, no boost  . Hyperlipidemia   . Hypertension    PCP DR Nolon Rod  . Hypothyroidism   . Peripheral vascular disease ([place])    pulmonary embolis-still have some  . Post concussion syndrome    4 yrs ago- sees [facility]- Dr Leonie Man every 6 months-   has sleep study 2011 following accident but was neg per patient-  short term memory issues, dates and times  . Pulmonary embolism ([place]) 2013  . Recurrent upper respiratory infection (URI)    FLU 08/23/11-08/29/11 then URI 08/29/11- 09/08/11- S/P zpack and steroids-  improved      no cough or congestion  . Thyroid disease   . Thyroid mass, left inferior lobe, 3.6cm [id number] 10/07/2011  . Ventral hernia     Family History  Problem Relation Age of Onset  . Malignant hyperthermia Mother   . Heart attack Father   . Stroke Father   . Diabetes Unknown        Grandmother  . Other Unknown        respiratory- Grandmother  . Cancer Maternal Grandmother 89       breast    Past Surgical History:  Procedure Laterality Date  . APPENDECTOMY  2002   lap appy.  Dr. Hassell Done  . BREAST  LUMPECTOMY Left 2015   radiation  . BREAST LUMPECTOMY WITH NEEDLE LOCALIZATION AND AXILLARY SENTINEL LYMPH NODE BX Left 08/04/2013   Procedure: BREAST LUMPECTOMY WITH NEEDLE LOCALIZATION AND AXILLARY SENTINEL LYMPH NODE Biopsy x 3;  Surgeon: Rolm Bookbinder, MD;  Location: [facility];  Service: General;  Laterality: Left;  . BREAST SURGERY    . CARPAL TUNNEL RELEASE  10/2009-12/2010   left - right  . CESAREAN SECTION  X 2  . COLONOSCOPY    . HERNIA REPAIR  09/15/11   Ventral w/mesh  . JOINT REPLACEMENT    . KNEE ARTHROPLASTY Right 12/22/2015   Procedure: COMPUTER ASSISTED TOTAL KNEE ARTHROPLASTY;  Surgeon: Marybelle Killings, MD;  Location: [facility];  Service: Orthopedics;  Laterality:  Right;  Marland [facility] LAPAROSCOPIC CHOLECYSTECTOMY  2006   Dr. Bubba Camp  . TARSAL TUNNEL RELEASE Bilateral   . THYROIDECTOMY, PARTIAL    . VENTRAL HERNIA REPAIR  09/29/2011   Procedure: LAPAROSCOPIC VENTRAL HERNIA;  Surgeon: Adin Hector, MD;  Location: [facility];  Service: General;  Laterality: N/A;  Laparoscopic Ventral Wall Hernia Repair with Mesh   Social History   Occupational History    Employer: [employer]  Tobacco Use  . Smoking status: Never Smoker  . Smokeless tobacco: Never Used  Substance and Sexual Activity  . Alcohol use: No    Alcohol/week: 1.0 standard drinks    Types: 1 Glasses of wine per week  . Drug use: No  . Sexual activity: Yes    Comment: Menarche age 24, first live birth age 45,  [place] P2. She stopped having periods approximately 1999 and took HRT until 2005.

## 2018-06-14 ENCOUNTER — Ambulatory Visit: Payer: Medicare Other | Admitting: Internal Medicine

## 2018-06-14 ENCOUNTER — Encounter: Payer: Self-pay | Admitting: Internal Medicine

## 2018-06-14 VITALS — BP 128/73 | HR 56 | Ht 62.0 in | Wt 205.8 lb

## 2018-06-14 DIAGNOSIS — E7849 Other hyperlipidemia: Secondary | ICD-10-CM

## 2018-06-14 DIAGNOSIS — I2584 Coronary atherosclerosis due to calcified coronary lesion: Secondary | ICD-10-CM

## 2018-06-14 DIAGNOSIS — E785 Hyperlipidemia, unspecified: Secondary | ICD-10-CM

## 2018-06-14 DIAGNOSIS — I2510 Atherosclerotic heart disease of native coronary artery without angina pectoris: Secondary | ICD-10-CM

## 2018-06-14 DIAGNOSIS — K760 Fatty (change of) liver, not elsewhere classified: Secondary | ICD-10-CM | POA: Diagnosis not present

## 2018-06-14 NOTE — Progress Notes (Signed)
LIPID CLINIC CONSULT NOTE  Chief Complaint:  Dyslipidemia  Primary Care Physician: Lanice Shirts, MD  HPI:  Alyssa [name] is a 67 y.o. female who is being seen today for the evaluation of dyslipidemia at the request of [place], Altamese Cabal, *.  This is a very pleasant 67 year old female with a significant history of dyslipidemia.  She first noted she had elevated cholesterol in her 40s.  She has a strong family history of elevated cholesterol and heart disease.  Both of her brothers are age 8 and 49 and have elevated cholesterols over 400.  Her mom also had elevated cholesterol and her father had heart disease and died at age 60.  Unfortunately she has been intolerant to statins and is been on numerous statins in the past including Crestor, Lipitor, lovastatin and ezetimibe.  She also has nonalcoholic fatty liver disease.  Most recently her total cholesterol was 343, triglycerides 154, HDL of 40 and LDL of 272.  This is after losing 40 pounds recently although she is still moderately obese on the ketogenic diet.  She is also been taking Crestor but had significant pain with it and then finally discontinued it.  She has no known coronary disease although did have a pulmonary embolus in 2013.  I personally reviewed her CT scan which does show multivessel coronary artery calcification.  Is a history of breast cancer, diabetes which has resolved with weight loss, and postconcussive syndrome.  She reports that she had had a fairly atherogenic diet however recently again switched ketogenic diet and is cut out a lot of "bad saturated fats".  PMHx:  Past Medical History:  Diagnosis Date  . Abdominal pain   . Breast cancer ([place]) 06/2013   left upper inner  . Cancer ([place])    breast  . Diabetes mellitus without complication ([place])    Takes Tradjenta  . Family history of adverse reaction to anesthesia    Patients mother had to be resusciated after having anesthesia  . GERD  (gastroesophageal reflux disease)   . Headache(784.0)    Takes Depakote  . Hernia, incisional periumbilical, incarcerated 0/04/2724  . Hot flashes   . Hx of radiation therapy 09/09/13- 10/13/13   left breast 5000 cGy in 25 sessions, no boost  . Hyperlipidemia   . Hypertension    PCP DR Nolon Rod  . Hypothyroidism   . Peripheral vascular disease ([place])    pulmonary embolis-still have some  . Post concussion syndrome    4 yrs ago- sees [facility]- Dr Leonie Man every 6 months-   has sleep study 2011 following accident but was neg per patient-  short term memory issues, dates and times  . Pulmonary embolism ([place]) 2013  . Recurrent upper respiratory infection (URI)    FLU 08/23/11-08/29/11 then URI 08/29/11- 09/08/11- S/P zpack and steroids-  improved      no cough or congestion  . Thyroid disease   . Thyroid mass, left inferior lobe, 3.6cm [id number] 10/07/2011  . Ventral hernia     Past Surgical History:  Procedure Laterality Date  . APPENDECTOMY  2002   lap appy.  Dr. Hassell Done  . BREAST LUMPECTOMY Left 2015   radiation  . BREAST LUMPECTOMY WITH NEEDLE LOCALIZATION AND AXILLARY SENTINEL LYMPH NODE BX Left 08/04/2013   Procedure: BREAST LUMPECTOMY WITH NEEDLE LOCALIZATION AND AXILLARY SENTINEL LYMPH NODE Biopsy x 3;  Surgeon: Rolm Bookbinder, MD;  Location: [facility];  Service: General;  Laterality: Left;  . BREAST SURGERY    .  CARPAL TUNNEL RELEASE  10/2009-12/2010   left - right  . CESAREAN SECTION  X 2  . COLONOSCOPY    . HERNIA REPAIR  09/15/11   Ventral w/mesh  . JOINT REPLACEMENT    . KNEE ARTHROPLASTY Right 12/22/2015   Procedure: COMPUTER ASSISTED TOTAL KNEE ARTHROPLASTY;  Surgeon: Marybelle Killings, MD;  Location: [facility];  Service: Orthopedics;  Laterality: Right;  . LAPAROSCOPIC CHOLECYSTECTOMY  2006   Dr. Bubba Camp  . TARSAL TUNNEL RELEASE Bilateral   . THYROIDECTOMY, PARTIAL    . VENTRAL HERNIA REPAIR  09/29/2011   Procedure: LAPAROSCOPIC VENTRAL HERNIA;  Surgeon: Adin Hector, MD;  Location:  [facility];  Service: General;  Laterality: N/A;  Laparoscopic Ventral Wall Hernia Repair with Mesh    FAMHx:  Family History  Problem Relation Age of Onset  . Malignant hyperthermia Mother   . Heart attack Father   . Stroke Father   . Diabetes Unknown        Grandmother  . Other Unknown        respiratory- Grandmother  . Cancer Maternal Grandmother 49       breast    SOCHx:   reports that she has never smoked. She has never used smokeless tobacco. She reports that she does not drink alcohol or use drugs.  ALLERGIES:  Allergies  Allergen Reactions  . Aleve [Naproxen Sodium] Hives, Itching and Swelling  . Aromasin [Exemestane] Anaphylaxis, Itching and Swelling    Pt currently tolerating medication well (02/03/16)-gwd; Pt stated "I can take this medication for a few months at a time and I take a benadryl when I feel it starting to come on" Patient has facial swelling, itching, and throat swelling  07/12/17  Taking this right now and no problem. sy/rn  . Letrozole Shortness Of Breath, Rash and Other (See Comments)    Swelling - feet, eyes,hands;   Speech garbled  . Levofloxacin Rash and Swelling    Facial swelling and red facial rash  . Penicillins Hives, Swelling and Rash    All over the body. Has patient had a PCN reaction causing immediate rash, facial/tongue/throat swelling, SOB or lightheadedness with hypotension: Yes Has patient had a PCN reaction causing severe rash involving mucus membranes or skin necrosis: No Has patient had a PCN reaction that required hospitalization No Has patient had a PCN reaction occurring within the last 10 years: No If all of the above answers are "NO", then may proceed with Cephalosporin use.   . Symbicort [Budesonide-Formoterol Fumarate] Anaphylaxis, Hives, Swelling and Rash  . Codeine Other (See Comments)    hallucinations  . Percocet [Oxycodone-Acetaminophen] Other (See Comments)    Causes syncope day after medication has been taken.  .  Vicodin [Hydrocodone-Acetaminophen] Other (See Comments)    Causes syncope day after medication has been taken.  . Doxycycline Swelling  . Ezetimibe Other (See Comments)    Myalgias,fatigue  . Prednisone Other (See Comments)    Unknown  . Cefdinir Rash and Swelling    ROS: Pertinent items noted in HPI and remainder of comprehensive ROS otherwise negative.  HOME MEDS: Current Outpatient Medications on File Prior to Visit  Medication Sig Dispense Refill  . Alirocumab (PRALUENT) 150 MG/ML SOAJ Inject 1 Dose into the skin every 14 (fourteen) days.    . ALPRAZolam (XANAX) 0.5 MG tablet Take 0.5 mg by mouth at bedtime as needed for anxiety or sleep.     Francia Greaves THYROID 90 MG tablet TK 1 T PO QAM  ON AN EMPTY STOMACH AND 1/2 T BEFORE LUNCH  3  . divalproex (DEPAKOTE) 500 MG DR tablet Take 1 tablet (500 mg total) by mouth daily. 90 tablet 3  . exemestane (AROMASIN) 25 MG tablet Take 1 tablet (25 mg total) by mouth daily after breakfast. 90 tablet 4  . fexofenadine-pseudoephedrine (ALLEGRA-D 24) 180-240 MG 24 hr tablet Take 1 tablet by mouth daily.    . fluticasone (FLONASE) 50 MCG/ACT nasal spray Place 2 sprays into both nostrils 2 (two) times daily. 16 g 3  . hydrochlorothiazide (HYDRODIURIL) 25 MG tablet Take 25 mg by mouth at bedtime.     . meloxicam (MOBIC) 15 MG tablet Take 1 tablet (15 mg total) by mouth daily as needed for pain. 30 tablet 0  . omeprazole (PRILOSEC) 40 MG capsule Take 1 capsule (40 mg total) by mouth daily. 30 capsule 3  . oxybutynin (DITROPAN-XL) 10 MG 24 hr tablet   0   Current Facility-Administered Medications on File Prior to Visit  Medication Dose Route Frequency Provider Last Rate Last Dose  . fluticasone (FLOVENT HFA) 44 MCG/ACT inhaler 2 puff  2 puff Inhalation BID Chesley Mires, MD        LABS/IMAGING: No results found for this or any previous visit (from the past 48 hour(s)). No results found.  LIPID PANEL: No results found for: CHOL, TRIG, HDL, CHOLHDL,  VLDL, LDLCALC, LDLDIRECT  WEIGHTS: Wt Readings from Last 3 Encounters:  06/14/18 205 lb 12.8 oz (93.4 kg)  06/11/18 199 lb (90.3 kg)  04/05/18 202 lb 6.4 oz (91.8 kg)    VITALS: BP 128/73   Pulse (!) 56   Ht 5' 2"  (1.575 m)   Wt 205 lb 12.8 oz (93.4 kg)   BMI 37.64 kg/m   EXAM: General appearance: alert, no distress and moderately obese Neck: no carotid bruit, no JVD and thyroid not enlarged, symmetric, no tenderness/mass/nodules Lungs: clear to auscultation bilaterally Heart: regular rate and rhythm, S1, S2 normal, no murmur, click, rub or gallop Abdomen: soft, non-tender; bowel sounds normal; no masses,  no organomegaly Extremities: extremities normal, atraumatic, no cyanosis or edema Pulses: 2+ and symmetric Skin: Skin color, texture, turgor normal. No rashes or lesions Neurologic: Grossly normal Psych: Pleasant  EKG: Deferred  ASSESSMENT: 1. 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 Family history of premature coronary disease 3. Statin intolerance 4. Multivessel coronary calcification  PLAN: 1.   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  This is been an issue since her 62s or 78s.  There is significant heart disease in her family with premature coronary disease and significant dyslipidemia in both of her brothers.  She does also have 2 children, they apparently have normal cholesterol.  I have encouraged her to pursue genetic testing for possible familial hyperlipidemia.  In addition I feel like she is a good candidate for Praluent. We will monitor liver enzymes although I suspect will be well-tolerated.  Plan follow-up with me afterwards.  Thanks again for the kind referral.  Pixie Casino, MD, FACC, [place] Director of the Advanced Lipid Disorders &  Cardiovascular Risk Reduction Clinic Diplomate of the American Board of Clinical Lipidology Attending  Cardiologist  Direct Dial: [phone]  Fax: [phone]  Website:  [email or web address] Lenix Kidd 06/14/2018, 4:50 PM

## 2018-06-14 NOTE — Patient Instructions (Signed)
Medication Instructions:  Dr. Debara Pickett recommends Praluent 162m/mL (PCSK9). This is an injectable cholesterol medication. This medication will need prior approval with your insurance company, which we will work on. If the medication is not approved initially, we may need to do an appeal with your insurance. We will keep you updated on this process. This medication can be provided at some local pharmacies or be shipped to you from a specialty pharmacy.   If you need a refill on your cardiac medications before your next appointment, please call your pharmacy.   Lab work: NON-FASTING lab work to check liver enzymes 1 month after starting Praluent  FASTING lab work in 3-4 months to check cholesterol (prior to next visit with Dr. HDebara PickettIf you have labs (blood work) drawn today and your tests are completely normal, you will receive your results only by: .Marland KitchenMyChart Message (if you have MyChart) OR . A paper copy in the mail If you have any lab test that is abnormal or we need to change your treatment, we will call you to review the results.  Testing/Procedures: NONE  Follow-Up: [facility] you and your health needs are our priority.  As part of our continuing mission to provide you with exceptional heart care, we have created designated Provider Care Teams.  These Care Teams include your primary Cardiologist (physician) and Advanced Practice Providers (APPs -  Physician Assistants and Nurse Practitioners) who all work together to provide you with the care you need, when you need it. . You will need a follow up appointment in 3-4 months with Dr. HDebara Pickett(lipid clinic)  Any Other Special Instructions Will Be Listed Below (If Applicable). You have been referred to Dr. SLattie Corns She sees patients at [facility]

## 2018-06-17 ENCOUNTER — Telehealth: Payer: Self-pay | Admitting: Internal Medicine

## 2018-06-17 MED ORDER — ALIROCUMAB 150 MG/ML ~~LOC~~ SOAJ: 1.0000 | SUBCUTANEOUS | 11 refills | Status: DC

## 2018-06-17 NOTE — Telephone Encounter (Addendum)
Approved today  Request Reference Number: [id number].  PRALUENT INJ 150MG/ML is approved through 12/16/2018  Patient notified via [place]

## 2018-06-17 NOTE — Telephone Encounter (Signed)
PA for Praluent submitted via [email or web address]  Key: [id number] - PA Case ID: [id number]

## 2018-06-17 NOTE — Addendum Note (Signed)
Addended by: Fidel Levy on: 06/17/2018 01:01 PM   Modules accepted: Orders

## 2018-06-18 NOTE — Telephone Encounter (Signed)
Rx sent to [facility] 11/25 for processing - determine co-pay amount

## 2018-06-25 ENCOUNTER — Telehealth (INDEPENDENT_AMBULATORY_CARE_PROVIDER_SITE_OTHER): Payer: Self-pay | Admitting: Orthopaedic Surgery

## 2018-06-25 NOTE — Telephone Encounter (Signed)
Called patient left message on voicemail to return call to schedule an appointment for MRI review

## 2018-06-26 ENCOUNTER — Telehealth (INDEPENDENT_AMBULATORY_CARE_PROVIDER_SITE_OTHER): Payer: Self-pay | Admitting: Orthopaedic Surgery

## 2018-06-26 NOTE — Telephone Encounter (Signed)
Returned call to patient left message to call back. 

## 2018-06-30 ENCOUNTER — Ambulatory Visit
Admission: RE | Admit: 2018-06-30 | Discharge: 2018-06-30 | Disposition: A | Discharge status: Home / Self Care | Payer: Medicare Other | Source: Ambulatory Visit | Attending: Orthopaedic Surgery | Admitting: Orthopaedic Surgery

## 2018-06-30 DIAGNOSIS — M4807 Spinal stenosis, lumbosacral region: Secondary | ICD-10-CM

## 2018-07-02 NOTE — Telephone Encounter (Signed)
MyChart message sent to patient to f/up on Praleunt Rx

## 2018-07-09 ENCOUNTER — Telehealth: Payer: Self-pay | Admitting: Internal Medicine

## 2018-07-09 NOTE — Telephone Encounter (Signed)
LMTCB to f/u on praluent rx

## 2018-07-09 NOTE — Telephone Encounter (Signed)
Acknowledged.

## 2018-07-09 NOTE — Telephone Encounter (Signed)
Follow Up:     Pt wanted you to know that she did get her medicine and she have used it twice.

## 2018-07-09 NOTE — Telephone Encounter (Signed)
Per another phone note, patient has received medication and has had 2 doses.

## 2018-07-13 ENCOUNTER — Other Ambulatory Visit (INDEPENDENT_AMBULATORY_CARE_PROVIDER_SITE_OTHER): Payer: Self-pay | Admitting: Orthopaedic Surgery

## 2018-07-14 ENCOUNTER — Other Ambulatory Visit: Payer: Self-pay | Admitting: Pulmonary Disease

## 2018-07-15 ENCOUNTER — Ambulatory Visit: Payer: 59 | Admitting: Nurse Practitioner

## 2018-07-15 NOTE — Telephone Encounter (Signed)
Ok for refill? 

## 2018-07-22 NOTE — Progress Notes (Signed)
[facility]  PATIENT: Alyssa [name] DOB: 1951-04-21   REASON FOR VISIT: Follow-up for memory loss, post concussion syndrome, headaches HISTORY FROM: Patient    HISTORY OF PRESENT ILLNESS: UPDATE 1/2/2020CM Ms. Previti, 67 year old female returns for follow-up with history of memory loss postconcussion syndrome and headaches.  She is currently on Depakote 500 mg at night with good control of her headaches however she does wake up with some morning headaches occasionally.  She occasionally takes Tylenol with relief.  She denies any daytime drowsiness or snoring.  She claims she feels the best she has in a long time she is exercising.  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  She denies any issues with her job performance.  Her mother recently died.  She returns for reevaluation    Update 12/20/2018CM Ms. Scheib, 67 year old female returns for follow-up with history of headaches and postconcussion syndrome and memory loss.  For her headaches she is taking Depakote but decided to taper herself off of the medication back in the summer, her headaches returned and she resumed the Depakote.  Her headaches are now in good control her memory loss is stable per MMSE.  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  She has not had no issues performing her job.  She returns for reevaluation and refills   UPDATE 07/19/16 CMMs. Fazzino, 67 year old female returns for yearly followup.  Patient fell off of the chair at school back in June2015 and hit her head. CT of the head at that time without abnormalities her headaches returned and she started taking her Depakote again with good benefit. Her memory is stable per MMSE. 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  She has not had any issues performing  her job. She had breast cancer surgery January  2015. She had knee replacement on the right 12/22/2015 She returns for reevaluation and refills   HISTORY: of cognitive changes post head injury. She was initially evaluated by Dr. Leonie Man in September of 2009. She was operating a push mower when she was going down a ditch in front of her home and toppled over the handle and hit her head on the motor of the mower. At that time she was not sure she loss consciousness or not,she was able to get up go indoors. She did not seek medical help that day, however she did see her primary care the next day when she was complaining of severe headache, appeared to be confused and disoriented and did not recognize the Dr. CT of the head showed no acute abnormality. She has since had an MRI of the brain which was read as normal as well as an EEG that was normal. She returns today continuing to complain with short-term memory, attention, and concentration difficulties but she continues to work and she has not had any difficulty performing her job duties. She says she manages by using sticky notes, so that she will not forget things. She denies any depression ,she is trying to exercise and has lost weight since last seen. She does complain of early morning headaches and fatigue, does not feel rested after sleeping although that is better with Trazodone. She is currently on Depakote 500 daily 2 daily for headaches.Her headaches have returned since she is back to teaching after being off for the summer. She says these are stress related. She does  not want to increase medication. There is no nausea, photophobia, with the headaches. 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 Fell several weeks ago and hit the back of the head. No obvious injury. See ROS  07/18/12: Returns for followup. MMSE stable. Went off Depakote for a few weeks and headaches returned and mood was terrible. Her PCP gave her a RX to get back on the Depakote. Headaches are stable at present.  Continues to work with out problems with performing job. Had hernia repair in March and complication of PE after surgery. Has just gotten off coumadin. No new complaints   REVIEW OF SYSTEMS: Full 14 system review of systems performed and notable only for those listed, all others are neg:  Constitutional: neg  Cardiovascular: neg Ear/Nose/Throat: neg  Skin: neg Eyes: neg Respiratory: neg Gastroitestinal: neg  Hematology/Lymphatic: neg  Endocrine: neg Musculoskeletal:neg Allergy/Immunology: neg Neurological: Memory loss, headaches Psychiatric: neg Sleep : neg   ALLERGIES: Allergies  Allergen Reactions  . Aleve [Naproxen Sodium] Hives, Itching and Swelling  . Aromasin [Exemestane] Anaphylaxis, Itching and Swelling    Pt currently tolerating medication well (02/03/16)-gwd; Pt stated "I can take this medication for a few months at a time and I take a benadryl when I feel it starting to come on" Patient has facial swelling, itching, and throat swelling  07/12/17  Taking this right now and no problem. sy/rn  . Letrozole Shortness Of Breath, Rash and Other (See Comments)    Swelling - feet, eyes,hands;   Speech garbled  . Levofloxacin Rash and Swelling    Facial swelling and red facial rash  . Penicillins Hives, Swelling and Rash    All over the body. Has patient had a PCN reaction causing immediate rash, facial/tongue/throat swelling, SOB or lightheadedness with hypotension: Yes Has patient had a PCN reaction causing severe rash involving mucus membranes or skin necrosis: No Has patient had a PCN reaction that required hospitalization No Has patient had a PCN reaction occurring within the last 10 years: No If all of the above answers are "NO", then may proceed with Cephalosporin use.   . Symbicort [Budesonide-Formoterol Fumarate] Anaphylaxis, Hives, Swelling and Rash  . Codeine Other (See Comments)    hallucinations  . Percocet [Oxycodone-Acetaminophen] Other (See Comments)     Causes syncope day after medication has been taken.  . Vicodin [Hydrocodone-Acetaminophen] Other (See Comments)    Causes syncope day after medication has been taken.  . Doxycycline Swelling  . Ezetimibe Other (See Comments)    Myalgias,fatigue  . Prednisone Other (See Comments)    Unknown  . Cefdinir Rash and Swelling    HOME MEDICATIONS: Outpatient Medications Prior to Visit  Medication Sig Dispense Refill  . Alirocumab (PRALUENT) 150 MG/ML SOAJ Inject 1 Dose into the skin every 14 (fourteen) days. 2 pen 11  . ALPRAZolam (XANAX) 0.5 MG tablet Take 0.5 mg by mouth at bedtime as needed for anxiety or sleep.     Francia Greaves THYROID 90 MG tablet TK 1 T PO QAM ON AN EMPTY STOMACH AND 1/2 T BEFORE LUNCH  3  . divalproex (DEPAKOTE) 500 MG DR tablet Take 1 tablet (500 mg total) by mouth daily. 90 tablet 3  . exemestane (AROMASIN) 25 MG tablet Take 1 tablet (25 mg total) by mouth daily after breakfast. 90 tablet 4  . fexofenadine-pseudoephedrine (ALLEGRA-D 24) 180-240 MG 24 hr tablet Take 1 tablet by mouth daily.    Marland Kitchen FLOVENT HFA 44 MCG/ACT inhaler INHALE  2 PUFFS INTO THE LUNGS TWICE DAILY 10.6 g 0  . fluticasone (FLONASE) 50 MCG/ACT nasal spray Place 2 sprays into both nostrils 2 (two) times daily. 16 g 3  . hydrochlorothiazide (HYDRODIURIL) 25 MG tablet Take 25 mg by mouth at bedtime.     . meloxicam (MOBIC) 15 MG tablet TAKE 1 TABLET(15 MG) BY MOUTH DAILY AS NEEDED FOR PAIN 30 tablet 0  . nitrofurantoin (MACRODANTIN) 100 MG capsule Take 100 mg by mouth 2 (two) times daily.    Marland Kitchen omeprazole (PRILOSEC) 40 MG capsule Take 1 capsule (40 mg total) by mouth daily. 30 capsule 3  . oxybutynin (DITROPAN-XL) 10 MG 24 hr tablet   0   Facility-Administered Medications Prior to Visit  Medication Dose Route Frequency Provider Last Rate Last Dose  . fluticasone (FLOVENT HFA) 44 MCG/ACT inhaler 2 puff  2 puff Inhalation BID Chesley Mires, MD        PAST MEDICAL HISTORY: Past Medical History:  Diagnosis  Date  . Abdominal pain   . Breast cancer ([place]) 06/2013   left upper inner  . Cancer ([place])    breast  . Diabetes mellitus without complication ([place])    Takes Tradjenta  . Family history of adverse reaction to anesthesia    Patients mother had to be resusciated after having anesthesia  . GERD (gastroesophageal reflux disease)   . Headache(784.0)    Takes Depakote  . Hernia, incisional periumbilical, incarcerated 1/74/0814  . Hot flashes   . Hx of radiation therapy 09/09/13- 10/13/13   left breast 5000 cGy in 25 sessions, no boost  . Hyperlipidemia   . Hypertension    PCP DR Nolon Rod  . Hypothyroidism   . Peripheral vascular disease ([place])    pulmonary embolis-still have some  . Post concussion syndrome    4 yrs ago- sees [facility]- Dr Leonie Man every 6 months-   has sleep study 2011 following accident but was neg per patient-  short term memory issues, dates and times  . Pulmonary embolism ([place]) 2013  . Recurrent upper respiratory infection (URI)    FLU 08/23/11-08/29/11 then URI 08/29/11- 09/08/11- S/P zpack and steroids-  improved      no cough or congestion  . Thyroid disease   . 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 Ventral hernia     PAST SURGICAL HISTORY: Past Surgical History:  Procedure Laterality Date  . APPENDECTOMY  2002   lap appy.  Dr. Hassell Done  . BREAST LUMPECTOMY Left 2015   radiation  . BREAST LUMPECTOMY WITH NEEDLE LOCALIZATION AND AXILLARY SENTINEL LYMPH NODE BX Left 08/04/2013   Procedure: BREAST LUMPECTOMY WITH NEEDLE LOCALIZATION AND AXILLARY SENTINEL LYMPH NODE Biopsy x 3;  Surgeon: Rolm Bookbinder, MD;  Location: [facility];  Service: General;  Laterality: Left;  . BREAST SURGERY    . CARPAL TUNNEL RELEASE  10/2009-12/2010   left - right  . CESAREAN SECTION  X 2  . COLONOSCOPY    . HERNIA REPAIR  09/15/11   Ventral w/mesh  . JOINT REPLACEMENT    . KNEE ARTHROPLASTY Right 12/22/2015   Procedure: COMPUTER ASSISTED TOTAL KNEE ARTHROPLASTY;   Surgeon: Marybelle Killings, MD;  Location: [facility];  Service: Orthopedics;  Laterality: Right;  . LAPAROSCOPIC CHOLECYSTECTOMY  2006   Dr. Bubba Camp  . TARSAL TUNNEL RELEASE Bilateral   . THYROIDECTOMY, PARTIAL    . VENTRAL HERNIA REPAIR  09/29/2011   Procedure: LAPAROSCOPIC VENTRAL HERNIA;  Surgeon: Adin Hector, MD;  Location: [facility];  Service: General;  Laterality: N/A;  Laparoscopic Ventral Wall Hernia Repair with Mesh    FAMILY HISTORY: Family History  Problem Relation Age of Onset  . Malignant hyperthermia Mother   . Heart attack Father   . Stroke Father   . Diabetes Unknown        Grandmother  . Other Unknown        respiratory- Grandmother  . Cancer Maternal Grandmother 66       breast    SOCIAL HISTORY: Social History   Socioeconomic History  . Marital status: Divorced    Spouse name: Not on file  . Number of children: 2  . Years of education: Master's  . Highest education level: Not on file  Occupational History    Employer: [employer]  Social Needs  . Financial resource strain: Not on file  . Food insecurity:    Worry: Not on file    Inability: Not on file  . Transportation needs:    Medical: Not on file    Non-medical: Not on file  Tobacco Use  . Smoking status: Never Smoker  . Smokeless tobacco: Never Used  Substance and Sexual Activity  . Alcohol use: No    Alcohol/week: 1.0 standard drinks    Types: 1 Glasses of wine per week  . Drug use: No  . Sexual activity: Yes    Comment: Menarche age 21, first live birth age 70,  [place] P2. She stopped having periods approximately 1999 and took HRT until 2005.  Lifestyle  . Physical activity:    Days per week: Not on file    Minutes per session: Not on file  . Stress: Not on file  Relationships  . Social connections:    Talks on phone: Not on file    Gets together: Not on file    Attends religious service: Not on file    Active member of club or organization: Not on file    Attends meetings of clubs or  organizations: Not on file    Relationship status: Not on file  . Intimate partner violence:    Fear of current or ex partner: Not on file    Emotionally abused: Not on file    Physically abused: Not on file    Forced sexual activity: Not on file  Other Topics Concern  . Not on file  Social History Narrative   Patient is divorced and lives alone.   Patient has two children.   Patient is a Pharmacist, hospital in [employer].   Patient has a Master's degree.   Patient drinks 3-4 cups of caffeine daily.    Patient is right handed.     PHYSICAL EXAM  Vitals:   07/25/18 1012  BP: (!) 146/85  Pulse: 60  Weight: 198 lb 3.2 oz (89.9 kg)  Height: 5' 2"  (1.575 m)   Body mass index is 36.25 kg/m. Generalized: Well developed, obese female in no acute distress  Head: normocephalic and atraumatic,. Oropharynx benign  Neck: Supple,  Musculoskeletal: No deformity   Neurological examination   Mentation: Alert  MMSE - Mini Mental State Exam 07/25/2018 07/12/2017 07/19/2016  Orientation to time 5 5 5   Orientation to Place 5 4 5   Registration 3 3 3   Attention/ Calculation 4 4 5   Recall 3 2 2   Language- name 2 objects 2 2 2   Language- repeat 1 1 1   Language- follow 3 step command 3 3 3   Language- read &  follow direction 1 1 1   Write a sentence 1 1 1   Copy design 1 1 1   Total score 29 29 29    AFT14 Clock drawing 4 out of 4 . Marland Kitchen Follows all commands speech and language fluent  Cranial nerve II-XII: Pupils were equal round reactive to light extraocular movements were full, visual field were full on confrontational test. Facial sensation and strength were normal. hearing was intact to finger rubbing bilaterally. Uvula tongue midline. head turning and shoulder shrug were normal and symmetric.Tongue protrusion into cheek strength was normal. Motor: normal bulk and tone, full strength in the BUE, BLE, fine finger movements normal, no pronator drift.  Sensory: normal and symmetric  to light touch, pinprick and vibratory in the upper and lower extremities Coordination: finger-nose-finger, heel-to-shin bilaterally, no dysmetria Reflexes: 1+ upper lower and symmetric Gait and Station: Rising up from seated position without assistance, normal stance, moderate stride, good arm swing, smooth turning, able to perform tiptoe, and heel walking without difficulty. Tandem gait is steady  DIAGNOSTIC DATA (LABS, IMAGING, TESTING) - I reviewed patient records, labs, notes, testing and imaging myself where available.  Lab Results  Component Value Date   WBC 6.0 11/05/2017   HGB 15.0 11/05/2017   HCT 46.1 11/05/2017   MCV 86.8 11/05/2017   PLT 246 11/05/2017      Component Value Date/Time   NA 140 11/05/2017 1047   NA 140 07/27/2016 1450   K 3.8 11/05/2017 1047   K 3.4 (L) 07/27/2016 1450   CL 100 11/05/2017 1047   CO2 29 11/05/2017 1047   CO2 29 07/27/2016 1450   GLUCOSE 92 11/05/2017 1047   GLUCOSE 85 07/27/2016 1450   BUN 19 11/05/2017 1047   BUN 15.4 07/27/2016 1450   CREATININE 0.99 11/05/2017 1047   CREATININE 0.8 07/27/2016 1450   CALCIUM 10.2 11/05/2017 1047   CALCIUM 9.8 07/27/2016 1450   PROT 8.1 11/05/2017 1047   PROT 7.4 07/27/2016 1450   ALBUMIN 4.3 11/05/2017 1047   ALBUMIN 4.1 07/27/2016 1450   AST 27 11/05/2017 1047   AST 21 07/27/2016 1450   ALT 50 11/05/2017 1047   ALT 27 07/27/2016 1450   ALKPHOS 62 11/05/2017 1047   ALKPHOS 59 07/27/2016 1450   BILITOT 0.6 11/05/2017 1047   BILITOT 0.75 07/27/2016 1450   GFRNONAA 59 (L) 11/05/2017 1047   GFRAA >60 11/05/2017 1047    ASSESSMENT AND PLAN  67 y.o. year old female  has a past medical history of Hyperlipidemia; Post concussion syndrome;  Hypertension; Headache(784.0);  here to follow-up for her memory loss which is stable and her headaches which is stable.   PLANContinue Depakote as ordered will refill for 1 year,  Reviewed most recent CBC and CMP, from 10/2017 and lipid profile from  06/14/18 Memory score remains stable at 29/30 F/U yearly Dennie Bible, [facility], [facility], [facility] [address] [place], [place] [address] [phone]

## 2018-07-23 ENCOUNTER — Ambulatory Visit (INDEPENDENT_AMBULATORY_CARE_PROVIDER_SITE_OTHER): Payer: Medicare Other | Admitting: Orthopaedic Surgery

## 2018-07-25 ENCOUNTER — Ambulatory Visit: Payer: Medicare Other | Admitting: Nurse Practitioner

## 2018-07-25 ENCOUNTER — Ambulatory Visit
Admission: RE | Admit: 2018-07-25 | Discharge: 2018-07-25 | Disposition: A | Discharge status: Home / Self Care | Payer: Medicare Other | Source: Ambulatory Visit | Attending: Oncology | Admitting: Oncology

## 2018-07-25 ENCOUNTER — Encounter: Payer: Self-pay | Admitting: Nurse Practitioner

## 2018-07-25 VITALS — BP 146/85 | HR 60 | Ht 62.0 in | Wt 198.2 lb

## 2018-07-25 DIAGNOSIS — F0781 Postconcussional syndrome: Secondary | ICD-10-CM | POA: Diagnosis not present

## 2018-07-25 DIAGNOSIS — R519 Headache, unspecified: Secondary | ICD-10-CM

## 2018-07-25 DIAGNOSIS — R51 Headache: Secondary | ICD-10-CM

## 2018-07-25 DIAGNOSIS — R413 Other amnesia: Secondary | ICD-10-CM

## 2018-07-25 DIAGNOSIS — Z9889 Other specified postprocedural states: Secondary | ICD-10-CM

## 2018-07-25 MED ORDER — DIVALPROEX SODIUM 500 MG PO DR TAB: 500.0000 mg | DELAYED_RELEASE_TABLET | Freq: Every day | ORAL | 3 refills | Status: DC

## 2018-07-25 NOTE — Patient Instructions (Signed)
Continue Depakote as ordered will refill for 1 year,  Reviewed most recent CBC and CMP, lipid profile Memory score remains stable at 29/30 F/U yearly

## 2018-07-31 ENCOUNTER — Other Ambulatory Visit: Payer: Self-pay | Admitting: Pulmonary Disease

## 2018-08-05 ENCOUNTER — Telehealth: Payer: Self-pay | Admitting: Oncology

## 2018-08-05 NOTE — Telephone Encounter (Signed)
Returned patient call re scheduling appointment. Per 08/02/2017 los patient see to [place] [facility] with lab March 2020. Scheduled lab/fu. Left message for patient. Schedule mailed.

## 2018-08-07 ENCOUNTER — Other Ambulatory Visit: Payer: Self-pay | Admitting: Nurse Practitioner

## 2018-10-01 ENCOUNTER — Telehealth: Payer: Self-pay | Admitting: Oncology

## 2018-10-01 NOTE — Telephone Encounter (Signed)
Per sch msg, patient wants to reschedule 3/17 appt. No answer, left msg to call back

## 2018-10-03 ENCOUNTER — Encounter: Payer: Medicare Other | Admitting: Genetic Counselor

## 2018-10-03 ENCOUNTER — Other Ambulatory Visit: Payer: Self-pay

## 2018-10-03 ENCOUNTER — Ambulatory Visit: Payer: Medicare Other | Admitting: Genetic Counselor

## 2018-10-07 ENCOUNTER — Ambulatory Visit: Payer: Self-pay | Admitting: Internal Medicine

## 2018-10-07 NOTE — Progress Notes (Signed)
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 Left diagnostic mammography and ultrasonography 07/21/2013 showed an irregular high-density mass in the upper inner quadrant of the left breast which was palpable at the 11:00 position 5 cm from the nipple. Ultrasound confirmed an irregular hypoechoic mass measuring 1.1 cm. The left axilla was unremarkable.  Biopsy of this mass was obtained the same day and showed ([id number]) an invasive ductal carcinoma, grade 1, estrogen and progesterone receptor both strongly positive by verbal report, with no HER-2 amplification (signals ratio 1.19, number per cell 1.85) and an MIB-1 of 5%.  The patient's subsequent history is as detailed below.   INTERVAL HISTORY: Alyssa [name] returns today for follow-up and treatment of her estrogen receptor postive breast cancer.   She continues on exemestain. She tolerates this well and without any noticeable side effects.    Jurnei's last bone density screening on 08/31/2016, showed a T-score of -1.5, which is considered osteopenic.    Since her last visit here, she underwent a Cervical Spine MRI for neck pain with bilateral  shoulder pain, hand pain, hand weakness, and and numbness on 06/30/2018 showing: No acute osseous or cord signal abnormality. Cervical spondylosis greatest at C5-6 and C6-7 levels. Mild C5-6 spinal canal stenosis. No high-grade spinal canal stenosis. C5-6 severe right and moderate left foraminal stenosis. C6-7 mild right and moderate left foraminal stenosis.  She also underwent a digital diagnostic bilateral mammogram with tomography on 07/25/2018 showing: Breast Density Category B. There is no mammographic evidence for malignancy.    REVIEW OF SYSTEMS: Alyssa [name] states that she has been losing weight; she notes that she has avoided sugar for the last 8 months, and that she avoids white foods like potatoes and bread. The patient denies unusual headaches, visual changes, nausea, vomiting, or dizziness. There has been no unusual cough, phlegm production, or pleurisy. This been no change in bowel or bladder habits. The patient denies unexplained fatigue or unexplained weight loss, bleeding, rash, or fever. A detailed review of systems was otherwise noncontributory.   Wt Readings from Last 3 Encounters:  10/08/18 199 lb 3.2 oz (90.4 kg)  07/25/18 198 lb 3.2 oz (89.9 kg)  06/14/18 205 lb 12.8 oz (93.4 kg)    PAST MEDICAL HISTORY: Past Medical History:  Diagnosis Date  . Abdominal pain   . Breast cancer ([facility]) 06/2013   left upper inner  . Cancer ([place])    breast  . Diabetes mellitus without complication ([place])    Takes Tradjenta  . Family history of adverse reaction to anesthesia    Patients mother had to be resusciated after having anesthesia  . GERD (gastroesophageal reflux disease)   . Headache(784.0)    Takes Depakote  . Hernia, incisional periumbilical, incarcerated 6/81/1572  . Hot flashes   . Hx of  radiation therapy 09/09/13- 10/13/13   left breast 5000 cGy in 25 sessions, no boost  . Hyperlipidemia   . Hypertension    PCP DR Nolon Rod  . Hypothyroidism   . Peripheral vascular disease  ([place])    pulmonary embolis-still have some  . Post concussion syndrome    68 yrs ago- sees [facility]- Dr Leonie Man every 6 months-   has sleep study 2011 following accident but was neg per patient-  short term memory issues, dates and times  . Pulmonary embolism ([place] [facility]) 2013  . Recurrent upper respiratory infection (URI)    FLU 08/23/11-08/29/11 then URI 08/29/11- 09/08/11- S/P zpack and steroids-  improved      no cough or congestion  . Thyroid disease   . Thyroid mass, left inferior lobe, 3.6cm [id number] 10/07/2011  . Ventral hernia     left thyroid mass biopsy 11/21/2011 showed lymphocytic thyroiditis, no malignancy   PAST SURGICAL HISTORY: Past Surgical History:  Procedure Laterality Date  . APPENDECTOMY  2002   lap appy.  Dr. Hassell Done  . BREAST BIOPSY    . BREAST LUMPECTOMY Left 2015   radiation  . BREAST LUMPECTOMY WITH NEEDLE LOCALIZATION AND AXILLARY SENTINEL LYMPH NODE BX Left 08/04/2013   Procedure: BREAST LUMPECTOMY WITH NEEDLE LOCALIZATION AND AXILLARY SENTINEL LYMPH NODE Biopsy x 3;  Surgeon: Rolm Bookbinder, MD;  Location: [facility];  Service: General;  Laterality: Left;  . BREAST SURGERY    . CARPAL TUNNEL RELEASE  67/2011-12/2010   left - right  . CESAREAN SECTION  X 2  . COLONOSCOPY    . HERNIA REPAIR  09/15/11   Ventral w/mesh  . JOINT REPLACEMENT    . KNEE ARTHROPLASTY Right 12/22/2015   Procedure: COMPUTER ASSISTED TOTAL KNEE ARTHROPLASTY;  Surgeon: Marybelle Killings, MD;  Location: [facility];  Service: Orthopedics;  Laterality: Right;  . LAPAROSCOPIC CHOLECYSTECTOMY  2006   Dr. Bubba Camp  . TARSAL TUNNEL RELEASE Bilateral   . THYROIDECTOMY, PARTIAL    . VENTRAL HERNIA REPAIR  09/29/2011   Procedure: LAPAROSCOPIC VENTRAL HERNIA;  Surgeon: Adin Hector, MD;  Location: [facility];  Service: General;  Laterality: N/A;  Laparoscopic Ventral Wall Hernia Repair with Mesh    FAMILY HISTORY: Family History  Problem Relation Age of Onset  . Malignant hyperthermia Mother   . Heart attack Father    . Stroke Father   . Diabetes Other        Grandmother  . Other Other        respiratory- Grandmother  . Cancer Maternal Grandmother 17       breast  . Breast cancer Maternal Grandmother    Patient's father died at the age of 84 from a myocardial infarction. The patient's mother, [name], died at age 57 on 22-Aug-2018. The patient has 2 brothers, no sisters. The patient's mother is mother was diagnosed with breast cancer at the age of 6. There is no history of breast or ovarian cancer in the family (there is a history of cervical cancer in the patient's daughter).   GYNECOLOGIC HISTORY:  Menarche age 73, first live birth age 63, the patient is [place] P2. She stopped having periods approximately 1999 and took hormone replacements until 2005.   SOCIAL HISTORY: (Updated March 2020) Aqueelah retired from working in [place] and now works at an alternative school in [place]. She is divorced and lives herself.  Daughter [name] is a business woman living in [place]. Son [name] lives in [place],  also in business. The patient has 3 grandchildren. She attends [employer]    ADVANCED DIRECTIVES: Not in place   HEALTH MAINTENANCE: Social History   Tobacco Use  . Smoking status: Never Smoker  . Smokeless tobacco: Never Used  Substance Use Topics  . Alcohol use: No    Alcohol/week: 1.0 standard drinks    Types: 1 Glasses of wine per week  . Drug use: No     Colonoscopy: Never  PAP:  Bone density: Never  Lipid panel:  Allergies  Allergen Reactions  . Aleve [Naproxen Sodium] Hives, Itching and Swelling  . Aromasin [Exemestane] Anaphylaxis, Itching and Swelling    Pt currently tolerating medication well (02/03/16)-gwd; Pt stated "I can take this medication for a few months at a time and I take a benadryl when I feel it starting to come on" Patient has facial swelling, itching, and throat swelling  07/12/17  Taking this right now and no problem. sy/rn  .  Letrozole Shortness Of Breath, Rash and Other (See Comments)    Swelling - feet, eyes,hands;   Speech garbled  . Levofloxacin Rash and Swelling    Facial swelling and red facial rash  . Penicillins Hives, Swelling and Rash    All over the body. Has patient had a PCN reaction causing immediate rash, facial/tongue/throat swelling, SOB or lightheadedness with hypotension: Yes Has patient had a PCN reaction causing severe rash involving mucus membranes or skin necrosis: No Has patient had a PCN reaction that required hospitalization No Has patient had a PCN reaction occurring within the last 10 years: No If all of the above answers are "NO", then may proceed with Cephalosporin use.   . Symbicort [Budesonide-Formoterol Fumarate] Anaphylaxis, Hives, Swelling and Rash  . Codeine Other (See Comments)    hallucinations  . Percocet [Oxycodone-Acetaminophen] Other (See Comments)    Causes syncope day after medication has been taken.  . Vicodin [Hydrocodone-Acetaminophen] Other (See Comments)    Causes syncope day after medication has been taken.  . Doxycycline Swelling  . Ezetimibe Other (See Comments)    Myalgias,fatigue  . Prednisone Other (See Comments)    Unknown  . Cefdinir Rash and Swelling    Current Outpatient Medications  Medication Sig Dispense Refill  . Alirocumab (PRALUENT) 150 MG/ML SOAJ Inject 1 Dose into the skin every 14 (fourteen) days. 2 pen 11  . ALPRAZolam (XANAX) 0.5 MG tablet Take 0.5 mg by mouth at bedtime as needed for anxiety or sleep.     Francia Greaves THYROID 90 MG tablet TK 1 T PO QAM ON AN EMPTY STOMACH AND 1/2 T BEFORE LUNCH  3  . divalproex (DEPAKOTE) 500 MG DR tablet TAKE 1 TABLET(500 MG) BY MOUTH DAILY 90 tablet 3  . fexofenadine (ALLEGRA) 60 MG tablet Take 1 tablet (60 mg total) by mouth 2 (two) times daily.    Marland Kitchen FLOVENT HFA 44 MCG/ACT inhaler INHALE 2 PUFFS INTO THE LUNGS TWICE DAILY 10.6 g 0  . fluticasone (FLONASE) 50 MCG/ACT nasal spray Place 2 sprays into  both nostrils 2 (two) times daily. 16 g 3  . hydrochlorothiazide (HYDRODIURIL) 25 MG tablet Take 25 mg by mouth at bedtime.     Marland Kitchen omeprazole (PRILOSEC) 40 MG capsule TAKE 1 CAPSULE(40 MG) BY MOUTH DAILY 30 capsule 3  . oxybutynin (DITROPAN-XL) 10 MG 24 hr tablet   0   Current Facility-Administered Medications  Medication Dose Route Frequency Provider Last Rate Last Dose  . fluticasone (FLOVENT HFA) 44 MCG/ACT inhaler  2 puff  2 puff Inhalation BID Chesley Mires, MD        OBJECTIVE: Middle-aged white woman in no acute distress  Vitals:   10/08/18 1305  BP: 133/77  Pulse: (!) 53  Resp: 18  Temp: 98.4 F (36.9 C)  SpO2: 98%     Body mass index is 36.43 kg/m.    ECOG FS:0 - Asymptomatic  Sclerae unicteric, pupils round and equal No cervical or supraclavicular adenopathy Lungs no rales or rhonchi Heart regular rate and rhythm Abd soft, nontender, positive bowel sounds MSK no focal spinal tenderness, no upper extremity lymphedema Neuro: nonfocal, well oriented, appropriate affect Breasts: Breast is benign per the left breast is status post lumpectomy and radiation.  There is no evidence of disease recurrence.  Both axillae are benign.  LAB RESULTS:  CMP     Component Value Date/Time   NA 140 11/05/2017 1047   NA 140 07/27/2016 1450   K 3.8 11/05/2017 1047   K 3.4 (L) 07/27/2016 1450   CL 100 11/05/2017 1047   CO2 29 11/05/2017 1047   CO2 29 07/27/2016 1450   GLUCOSE 92 11/05/2017 1047   GLUCOSE 85 07/27/2016 1450   BUN 19 11/05/2017 1047   BUN 15.4 07/27/2016 1450   CREATININE 0.99 11/05/2017 1047   CREATININE 0.8 07/27/2016 1450   CALCIUM 10.2 11/05/2017 1047   CALCIUM 9.8 07/27/2016 1450   PROT 8.1 11/05/2017 1047   PROT 7.4 07/27/2016 1450   ALBUMIN 4.3 11/05/2017 1047   ALBUMIN 4.1 07/27/2016 1450   AST 27 11/05/2017 1047   AST 21 07/27/2016 1450   ALT 50 11/05/2017 1047   ALT 27 07/27/2016 1450   ALKPHOS 62 11/05/2017 1047   ALKPHOS 59 07/27/2016 1450    BILITOT 0.6 11/05/2017 1047   BILITOT 0.75 07/27/2016 1450   GFRNONAA 59 (L) 11/05/2017 1047   GFRAA >60 11/05/2017 1047    I No results found for: SPEP  Lab Results  Component Value Date   WBC 5.3 10/08/2018   NEUTROABS 2.4 10/08/2018   HGB 14.8 10/08/2018   HCT 47.0 (H) 10/08/2018   MCV 87.5 10/08/2018   PLT 280 10/08/2018      Chemistry      Component Value Date/Time   NA 140 11/05/2017 1047   NA 140 07/27/2016 1450   K 3.8 11/05/2017 1047   K 3.4 (L) 07/27/2016 1450   CL 100 11/05/2017 1047   CO2 29 11/05/2017 1047   CO2 29 07/27/2016 1450   BUN 19 11/05/2017 1047   BUN 15.4 07/27/2016 1450   CREATININE 0.99 11/05/2017 1047   CREATININE 0.8 07/27/2016 1450      Component Value Date/Time   CALCIUM 10.2 11/05/2017 1047   CALCIUM 9.8 07/27/2016 1450   ALKPHOS 62 11/05/2017 1047   ALKPHOS 59 07/27/2016 1450   AST 27 11/05/2017 1047   AST 21 07/27/2016 1450   ALT 50 11/05/2017 1047   ALT 27 07/27/2016 1450   BILITOT 0.6 11/05/2017 1047   BILITOT 0.75 07/27/2016 1450       No results found for: LABCA2  No components found for: LABCA125  No results for input(s): INR in the last 168 hours.  Urinalysis    Component Value Date/Time   COLORURINE YELLOW 12/10/2015 1556   APPEARANCEUR CLEAR 12/10/2015 1556   LABSPEC 1.024 12/10/2015 1556   PHURINE 5.5 12/10/2015 1556   GLUCOSEU NEGATIVE 12/10/2015 [place] 12/10/2015 [place] 12/10/2015 1556  KETONESUR NEGATIVE 12/10/2015 [place] 12/10/2015 1556   UROBILINOGEN 0.2 10/07/2011 0501   NITRITE NEGATIVE 12/10/2015 1556   [place] 12/10/2015 1556    STUDIES:No results found.   ASSESSMENT: 68 y.o. [place] woman status post left breast biopsy 07/21/2013 for a clinical T1 N0, stage IA invasive ductal carcinoma, grade 1, estrogen and progesterone receptor strongly positive, HER-2 not amplified, with an MIB-1 of 5%  (1) History of pulmonary  embolism documented by CT angiography 10/07/2011 (9 days after ventral hernia repair); Dopplers 11-2011 showed no lower extremity DVTs; the patient received Coumadin for 10 months. Hypercoagulable  Workup performed in January 2015 showed negative LAC, anti-cardiolipin Abs, negative to borderline beta-2-glycoprotein Abs, AT, protein C and S, prothrombin II gene mutation and Factor V Leiden  (2) status post left lumpectomy and sentinel lymph node sampling 08/04/2013 for a pT1c pN0, stage IA invasive ductal carcinoma, grade 1, with repeat HER-2 again negative.  (3) Oncotype score of 14 predicts a risk of outside the breast recurrence within 10 years of 9% if the patient's only systemic therapy is tamoxifen for 5 years. Also predicts no benefit from chemotherapy.  (4) Adjuvant Radiation therapy started on 09/09/2013 completed on 10/13/2013  (5) Adjuvant Aromatase inhibitor therapy with Letrozole start date-11/19/2013  (a) Letrozole was discontinued 12/03/2013 secondary to allergic reaction (rash)  (b) anastrozole start date 12/05/2013, discontinued 12/01/2014 due to side effects  (c) exemestane started July 2016--continued through March 2020  (6) osteopenia with a T score of -2.2 on bone density 10/29/2013  (a) zolendronate given 11/05/2014, not repeated per patient preference  (b) bone density 08/31/2016 showed osteopenia with a T score of -1.5 at the [facility]   PLAN: Juniper is now a little over 5 years out from definitive surgery for her breast cancer with no evidence of disease recurrence.  This is very favorable.  She is completing 5 years of antiestrogens.  In her case there would be little to no benefit to continuing beyond 5 years and therefore we are stopping at this point.  I anticipate she will be feeling a little bit better but she has not been having significant side effects from the exemestane so no major changes anticipated  At this point I feel comfortable releasing her to her primary  care physician.  All she will need in terms of breast cancer follow-up is a yearly mammogram and a yearly physician breast exam  I will be glad to see Neoma Laming again at any point in the future if and when the need arises but as of now are making no further routine appointments for her here.  Mylan Lengyel, Virgie Dad, MD  10/08/18 1:25 PM Medical Oncology and Hematology [facility] [address] [place], [place] [address] Tel. [phone]    Fax. [phone]  I, Jacqualyn Posey am acting as a Education administrator for Chauncey Cruel, MD.   I, Lurline Del MD, have reviewed the above documentation for accuracy and completeness, and I agree with the above.

## 2018-10-08 ENCOUNTER — Inpatient Hospital Stay: Payer: Medicare Other | Attending: Oncology | Admitting: Oncology

## 2018-10-08 ENCOUNTER — Other Ambulatory Visit: Payer: Self-pay

## 2018-10-08 ENCOUNTER — Inpatient Hospital Stay: Payer: Medicare Other

## 2018-10-08 VITALS — BP 133/77 | HR 53 | Temp 98.4°F | Resp 18 | Ht 62.0 in | Wt 199.2 lb

## 2018-10-08 DIAGNOSIS — Z86711 Personal history of pulmonary embolism: Secondary | ICD-10-CM | POA: Diagnosis not present

## 2018-10-08 DIAGNOSIS — Z170 Estrogen receptor positive status [ER+]: Secondary | ICD-10-CM

## 2018-10-08 DIAGNOSIS — Z853 Personal history of malignant neoplasm of breast: Secondary | ICD-10-CM | POA: Insufficient documentation

## 2018-10-08 DIAGNOSIS — C50212 Malignant neoplasm of upper-inner quadrant of left female breast: Secondary | ICD-10-CM

## 2018-10-08 DIAGNOSIS — M8580 Other specified disorders of bone density and structure, unspecified site: Secondary | ICD-10-CM | POA: Insufficient documentation

## 2018-10-08 LAB — COMPREHENSIVE METABOLIC PANEL
ALT: 66 U/L — AB (ref 0–44)
AST: 33 U/L (ref 15–41)
Albumin: 4.5 g/dL (ref 3.5–5.0)
Alkaline Phosphatase: 47 U/L (ref 38–126)
Anion gap: 14 (ref 5–15)
BUN: 15 mg/dL (ref 8–23)
CHLORIDE: 97 mmol/L — AB (ref 98–111)
CO2: 29 mmol/L (ref 22–32)
Calcium: 9.9 mg/dL (ref 8.9–10.3)
Creatinine, Ser: 1.45 mg/dL — ABNORMAL HIGH (ref 0.44–1.00)
GFR calc Af Amer: 43 mL/min — ABNORMAL LOW (ref >60)
GFR calc non Af Amer: 37 mL/min — ABNORMAL LOW (ref >60)
Glucose, Bld: 102 mg/dL — ABNORMAL HIGH (ref 70–99)
Potassium: 3.9 mmol/L (ref 3.5–5.1)
Sodium: 140 mmol/L (ref 135–145)
Total Bilirubin: 1.1 mg/dL (ref 0.3–1.2)
Total Protein: 7.7 g/dL (ref 6.5–8.1)

## 2018-10-08 LAB — CBC WITH DIFFERENTIAL/PLATELET
Abs Immature Granulocytes: 0.01 10*3/uL (ref 0.00–0.07)
Basophils Absolute: 0.1 10*3/uL (ref 0.0–0.1)
Basophils Relative: 1 %
EOS PCT: 3 %
Eosinophils Absolute: 0.1 10*3/uL (ref 0.0–0.5)
HCT: 47 % — ABNORMAL HIGH (ref 36.0–46.0)
Hemoglobin: 14.8 g/dL (ref 12.0–15.0)
Immature Granulocytes: 0 %
Lymphocytes Relative: 43 %
Lymphs Abs: 2.3 10*3/uL (ref 0.7–4.0)
MCH: 27.6 pg (ref 26.0–34.0)
MCHC: 31.5 g/dL (ref 30.0–36.0)
MCV: 87.5 fL (ref 80.0–100.0)
Monocytes Absolute: 0.5 10*3/uL (ref 0.1–1.0)
Monocytes Relative: 9 %
Neutro Abs: 2.4 10*3/uL (ref 1.7–7.7)
Neutrophils Relative %: 44 %
Platelets: 280 10*3/uL (ref 150–400)
RBC: 5.37 MIL/uL — ABNORMAL HIGH (ref 3.87–5.11)
RDW: 13.6 % (ref 11.5–15.5)
WBC: 5.3 10*3/uL (ref 4.0–10.5)
nRBC: 0 % (ref 0.0–0.2)

## 2018-10-08 NOTE — Progress Notes (Signed)
Pre-test GC notes   Referral Reason Alyssa [name] was referred for genetic consult and testing of familial hypercholesterolemia (FH) by Dr. Debara [name].  Genetic Consultation Notes  We walked through the risk factors that can lead to hypercholesterolemia. I explained to her the characteristic features of a genetic condition, namely absence of risk factors, early age of presentation, increased disease severity and family history of the condition. The clinical manifestations of FH were also reviewed.   I discussed the molecular pathogenesis of FH. I informed her that [place] is primarily caused by pathogenic variants in three genes, namely APOB, LDLR and PCSK9. These pathogenic variants impact LDLR synthesis, degradation and recycling in cells leading to elevated LDL-C levels. We then walked through autosomal dominant inheritance pattern and viewed pedigree of families with heterozygous FH (HeFH) and homozygous FH (HoFH). I informed her that digenic or compound heterozygous mutations in APOB, LDLR and PCSK9 genes can cause HoFH.   We reviewed the likely outcomes of FH genetic testing. Based on the diagnostic criteria for FH, yields can range from 50%-90%. A positive yield is observed in  ~63% of patients with a definite clinical diagnosis of FH. I also made clear to her that a negative test does not exclude a genetic basis for FH. Polygenic inheritance where more than an average number of common variants with small effect increases plasma lipid levels as well as limitations in current genetic testing methodology can produce a negative result. Variants of unknown significance (VUS) can be seen in some cases. I explained to her that typically a VUS is so classified if the variant is not well understood as very few individuals have been reported to harbor this variant or its role in gene function has not been elucidated. Screening other first-degree family members by genetic testing was also discussed. Additionally, we  briefly touched upon the molecular basis of the different treatment modalities that are currently available.  Her medical and 5-generation family history was obtained. 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 She states that she was told she had elevated LDL-Cs at age 59 when she had blood work for early menopause. She tells me that she did not go in for annual checkup prior to this visit and is not aware as to how long she may have had elevated cholesterol. She informs me that she has not had any adverse events due to her hypercholesterolemia.    Risk Factors Alyssa [name] reports having non-alcoholic fatty liver disease, hypothyroidism, hypertension and past history of type 2 diabetes. She denies having renal dysfunction. She does not smoke but admits to having an atherogenic diet.  Family history Alyssa [name] (III.1) is one on 3 siblings. She has a 68 year old daughter (IV.74) and a 65 year old son (IV.2). Both her children have normal lipid profile and are in good health.   She has two younger brothers (III.2, III.3 who reportedly have cholesterol exceeding 400 but do not take medications to counter this.   Alyssa [name] father (II.6) had atherosclerotic heart disease in his 67s and was told to have stiff areteries He died suddenly at age 36. She tells me that her father was [place] when he suddenly collapsed and died. An autopsy was done, but she does not recollect the details and suspects that he died of a heart attack. She does not know her  other paternal relatives and is not aware of their medical history.  Alyssa [name] mother died of CHF at 50. There is no significant history of heart disease in her maternal lineage.  Impression and Plans  In summary, Alyssa [name] was diagnosed with hyperlipidemia at age 89 and does have some of the typical risk  factors for hyperlipidemia. Her father had atherosclerotic cardiovascular disease and died suddenly at age 84. In light of her presentation and family history of hyperlipidemia amongst her siblings and sudden death in her father, genetic testing is highly recommended. Genetic testing should evaluate the major genes implicated in familial hypercholesterolemia. Since this is an autosomal dominant condition, her children are at a 50% risk of inheriting it. If she tests positive, her kids can be tested later to determine if they have inherited the familial pathogenic variant. [name] verbalized understanding of this.   In addition, we discussed the protections afforded by the Genetic Information Non-Discrimination Act (GINA). I explained to her that GINA protects her from losing her employment or health insurance based on her genotype. However, these protections do not cover life insurance and disability. She verbalized understanding of this.  Please note that the patient has not been counseled in this visit on personal, cultural or ethical issues that he may face due to his heart condition.   Plans Alyssa [name] is interested in genetic testing. Blood was drawn today and sent out for testing   Alyssa [name], Ph.D, [facility] Clinical Molecular Geneticist

## 2018-10-09 ENCOUNTER — Other Ambulatory Visit: Payer: Self-pay | Admitting: Oncology

## 2018-10-09 NOTE — Progress Notes (Signed)
I called the patient and left her message alerting her to the fact that her kidneys and liver are not functioning perfectly at this point.  Possibly this is due to some of her medications.  I suggest that she bring this up to her primary care physician at the next visit.  Of course she has access to the labs through my chart.

## 2018-11-18 ENCOUNTER — Telehealth: Payer: Self-pay

## 2018-11-18 NOTE — Telephone Encounter (Signed)
Lipid PA

## 2018-11-19 ENCOUNTER — Ambulatory Visit: Payer: Medicare Other | Admitting: Internal Medicine

## 2018-11-25 ENCOUNTER — Other Ambulatory Visit: Payer: Self-pay

## 2018-11-26 ENCOUNTER — Other Ambulatory Visit: Payer: Self-pay | Admitting: Internal Medicine

## 2018-12-10 ENCOUNTER — Telehealth: Payer: Self-pay | Admitting: Internal Medicine

## 2018-12-10 NOTE — Telephone Encounter (Signed)
Received fax notice from [facility] that patient has been approved for praluent 192m/mL from 07/24/2018 - 07/24/2019

## 2019-01-01 LAB — HEPATIC FUNCTION PANEL
ALT: 52 IU/L — AB (ref 0–32)
AST: 31 IU/L (ref 0–40)
Albumin: 4.6 g/dL (ref 3.8–4.8)
Alkaline Phosphatase: 42 IU/L (ref 39–117)
Bilirubin Total: 0.6 mg/dL (ref 0.0–1.2)
Bilirubin, Direct: 0.17 mg/dL (ref 0.00–0.40)
Total Protein: 6.6 g/dL (ref 6.0–8.5)

## 2019-01-01 LAB — LIPID PANEL
Chol/HDL Ratio: 3.2 ratio (ref 0.0–4.4)
Cholesterol, Total: 168 mg/dL (ref 100–199)
HDL: 52 mg/dL (ref >39)
LDL CALC: 90 mg/dL (ref 0–99)
Triglycerides: 132 mg/dL (ref 0–149)
VLDL Cholesterol Cal: 26 mg/dL (ref 5–40)

## 2019-01-02 ENCOUNTER — Other Ambulatory Visit: Payer: Self-pay

## 2019-01-02 ENCOUNTER — Ambulatory Visit: Payer: Medicare Other | Admitting: Genetic Counselor

## 2019-01-09 NOTE — Progress Notes (Signed)
Post-test GC notes   Due to the COVID-19 pandemic, patient consents to having a virtual genetic consult and for the visit details to be documented in her notes. Patient identity was confirmed using two unique identifiers of full name and date of birth   Referral Reason  Today's visit is to discuss Bernie Mooers's FH genetic test results   Genetic Consultation Notes  We first reviewed Dalma's pedigree and she informs me about the changes in her medical history and that of her family members. She does report that her latest lipid panel results were very encouraging. She attributed this to her change in diet, compliance with medication for her FH, namely Praluent and Zetia and getting off her cancer medication since March. She sounds cheerful and is happy with these changes. She continues to work from home during the [place] pandemic. She reports that her brothers are not in touch with her and are most likely not taking any medications for their hypercholesterolemia.  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 Mutations in this gene account for 1%-5% of mutations in patients with FH. I explained to her that this variant has not been reported in literature but has been noted by a few clinical labs that test for FH. 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 In the absence of this, if we can show that this variant segregates with disease, then we can classify this as likely pathogenic. She verbalized understanding of this. However, since she in not on talking terms with her FH affected brothers, we cannot pursue family risk segregation studies. In the absence of this, I recommended that her 67 year-old son and 41 year-old daughter pursue lipid panel testing at regular intervals. She informs me that they have done so in the past  and their lipids are currently in the normal range.  Please note that the patient has not been counseled in this visit on personal, cultural or ethical issues that she may face due to her heart condition.    Lattie Corns, Ph.D, [facility] Clinical Molecular Geneticist

## 2019-03-10 ENCOUNTER — Other Ambulatory Visit (INDEPENDENT_AMBULATORY_CARE_PROVIDER_SITE_OTHER): Payer: Self-pay | Admitting: Orthopaedic Surgery

## 2019-03-10 NOTE — Telephone Encounter (Signed)
Please advise 

## 2019-03-13 ENCOUNTER — Encounter: Payer: Self-pay | Admitting: Internal Medicine

## 2019-03-13 ENCOUNTER — Other Ambulatory Visit: Payer: Self-pay

## 2019-03-13 ENCOUNTER — Ambulatory Visit (INDEPENDENT_AMBULATORY_CARE_PROVIDER_SITE_OTHER): Payer: Medicare Other | Admitting: Internal Medicine

## 2019-03-13 VITALS — BP 154/84 | HR 70 | Temp 97.5°F | Ht 62.0 in | Wt 214.6 lb

## 2019-03-13 DIAGNOSIS — E7849 Other hyperlipidemia: Secondary | ICD-10-CM

## 2019-03-13 DIAGNOSIS — I2510 Atherosclerotic heart disease of native coronary artery without angina pectoris: Secondary | ICD-10-CM

## 2019-03-13 DIAGNOSIS — I2584 Coronary atherosclerosis due to calcified coronary lesion: Secondary | ICD-10-CM | POA: Diagnosis not present

## 2019-03-13 DIAGNOSIS — K760 Fatty (change of) liver, not elsewhere classified: Secondary | ICD-10-CM

## 2019-03-13 NOTE — Patient Instructions (Addendum)
Medication Instructions:  Your physician recommends that you continue on your current medications as directed. Please refer to the Current Medication list given to you today.  If you need a refill on your cardiac medications before your next appointment, please call your pharmacy.   Lab work: FASTING lab work in 6 months - we will mail you a lab order If you have labs (blood work) drawn today and your tests are completely normal, you will receive your results only by: Marland Kitchen MyChart Message (if you have MyChart) OR . A paper copy in the mail If you have any lab test that is abnormal or we need to change your treatment, we will call you to review the results.  Follow-Up: Dr. Debara Pickett recommends that you schedule a follow up visit with him the in the [place] in 6 months. Please have fasting blood work about 1 week prior to this visit and he will review the blood work results with you at your appointment.

## 2019-03-14 ENCOUNTER — Encounter: Payer: Self-pay | Admitting: Internal Medicine

## 2019-03-14 NOTE — Progress Notes (Signed)
LIPID CLINIC CONSULT NOTE  Chief Complaint:  Follow-up dyslipidemia  Primary Care Physician: Lanice Shirts, MD  HPI:  Alyssa [name] is a 68 y.o. female who is being seen today for the evaluation of dyslipidemia at the request of Schoenhoff, Altamese Cabal, *.  This is a very pleasant 68 year old female with a significant history of dyslipidemia.  She first noted she had elevated cholesterol in her 58s.  She has a strong family history of elevated cholesterol and heart disease.  Both of her brothers are age 2 and 82 and have elevated cholesterols over 400.  Her mom also had elevated cholesterol and her father had heart disease and died at age 42.  Unfortunately she has been intolerant to statins and is been on numerous statins in the past including Crestor, Lipitor, lovastatin and ezetimibe.  She also has nonalcoholic fatty liver disease.  Most recently her total cholesterol was 343, triglycerides 154, HDL of 40 and LDL of 272.  This is after losing 40 pounds recently although she is still moderately obese on the ketogenic diet.  She is also been taking Crestor but had significant pain with it and then finally discontinued it.  She has no known coronary disease although did have a pulmonary embolus in 2013.  I personally reviewed her CT scan which does show multivessel coronary artery calcification.  Is a history of breast cancer, diabetes which has resolved with weight loss, and postconcussive syndrome.  She reports that she had had a fairly atherogenic diet however recently again switched ketogenic diet and is cut out a lot of "bad saturated fats".  03/13/2019  [name] returns today for follow-up.  Recently had repeat lipids which showed marked improvement in numbers.  Total cholesterol is now 168 with triglycerides 132, HDL 52 and LDL of 90.  He is tolerating Praluent without any side effects.  Overall she is very pleased with her improved cholesterol numbers.  PMHx:  Past Medical  History:  Diagnosis Date  . Abdominal pain   . Breast cancer ([place]) 06/2013   left upper inner  . Cancer ([place])    breast  . Diabetes mellitus without complication ([place])    Takes Tradjenta  . Family history of adverse reaction to anesthesia    Patients mother had to be resusciated after having anesthesia  . GERD (gastroesophageal reflux disease)   . Headache(784.0)    Takes Depakote  . Hernia, incisional periumbilical, incarcerated 03/21/5620  . Hot flashes   . Hx of radiation therapy 09/09/13- 10/13/13   left breast 5000 cGy in 25 sessions, no boost  . Hyperlipidemia   . Hypertension    PCP DR Nolon Rod  . Hypothyroidism   . Peripheral vascular disease ([place])    pulmonary embolis-still have some  . Post concussion syndrome    4 yrs ago- sees [facility]- Dr Leonie Man every 6 months-   has sleep study 2011 following accident but was neg per patient-  short term memory issues, dates and times  . Pulmonary embolism ([place]) 2013  . Recurrent upper respiratory infection (URI)    FLU 08/23/11-08/29/11 then URI 08/29/11- 09/08/11- S/P zpack and steroids-  improved      no cough or congestion  . Thyroid disease   . 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 Ventral hernia     Past Surgical History:  Procedure Laterality Date  . APPENDECTOMY  2002   lap appy.  Dr. Hassell Done  . BREAST BIOPSY    .  BREAST LUMPECTOMY Left 2015   radiation  . BREAST LUMPECTOMY WITH NEEDLE LOCALIZATION AND AXILLARY SENTINEL LYMPH NODE BX Left 08/04/2013   Procedure: BREAST LUMPECTOMY WITH NEEDLE LOCALIZATION AND AXILLARY SENTINEL LYMPH NODE Biopsy x 3;  Surgeon: Rolm Bookbinder, MD;  Location: [place];  Service: General;  Laterality: Left;  . BREAST SURGERY    . CARPAL TUNNEL RELEASE  10/2009-12/2010   left - right  . CESAREAN SECTION  X 2  . COLONOSCOPY    . HERNIA REPAIR  09/15/11   Ventral w/mesh  . JOINT REPLACEMENT    . KNEE ARTHROPLASTY Right 12/22/2015   Procedure: COMPUTER ASSISTED TOTAL KNEE  ARTHROPLASTY;  Surgeon: Marybelle Killings, MD;  Location: [place];  Service: Orthopedics;  Laterality: Right;  . LAPAROSCOPIC CHOLECYSTECTOMY  2006   Dr. Bubba Camp  . TARSAL TUNNEL RELEASE Bilateral   . THYROIDECTOMY, PARTIAL    . VENTRAL HERNIA REPAIR  09/29/2011   Procedure: LAPAROSCOPIC VENTRAL HERNIA;  Surgeon: Adin Hector, MD;  Location: [facility];  Service: General;  Laterality: N/A;  Laparoscopic Ventral Wall Hernia Repair with Mesh    FAMHx:  Family History  Problem Relation Age of Onset  . Malignant hyperthermia Mother   . Heart attack Father   . Stroke Father   . Diabetes Other        Grandmother  . Other Other        respiratory- Grandmother  . Cancer Maternal Grandmother 50       breast  . Breast cancer Maternal Grandmother     SOCHx:   reports that she has never smoked. She has never used smokeless tobacco. She reports that she does not drink alcohol or use drugs.  ALLERGIES:  Allergies  Allergen Reactions  . Aleve [Naproxen Sodium] Hives, Itching and Swelling  . Aromasin [Exemestane] Anaphylaxis, Itching and Swelling    Pt currently tolerating medication well (02/03/16)-gwd; Pt stated "I can take this medication for a few months at a time and I take a benadryl when I feel it starting to come on" Patient has facial swelling, itching, and throat swelling  07/12/17  Taking this right now and no problem. sy/rn  . Letrozole Shortness Of Breath, Rash and Other (See Comments)    Swelling - feet, eyes,hands;   Speech garbled  . Levofloxacin Rash and Swelling    Facial swelling and red facial rash  . Penicillins Hives, Swelling and Rash    All over the body. Has patient had a PCN reaction causing immediate rash, facial/tongue/throat swelling, SOB or lightheadedness with hypotension: Yes Has patient had a PCN reaction causing severe rash involving mucus membranes or skin necrosis: No Has patient had a PCN reaction that required hospitalization No Has patient had a PCN reaction  occurring within the last 10 years: No If all of the above answers are "NO", then may proceed with Cephalosporin use.   . Symbicort [Budesonide-Formoterol Fumarate] Anaphylaxis, Hives, Swelling and Rash  . Codeine Other (See Comments)    hallucinations  . Percocet [Oxycodone-Acetaminophen] Other (See Comments)    Causes syncope day after medication has been taken.  . Vicodin [Hydrocodone-Acetaminophen] Other (See Comments)    Causes syncope day after medication has been taken.  . Doxycycline Swelling  . Ezetimibe Other (See Comments)    Myalgias,fatigue  . Prednisone Other (See Comments)    Unknown  . Cefdinir Rash and Swelling    ROS: Pertinent items noted in HPI and remainder of comprehensive ROS otherwise negative.  HOME  MEDS: Current Outpatient Medications on File Prior to Visit  Medication Sig Dispense Refill  . Alirocumab (PRALUENT) 150 MG/ML SOAJ Inject 1 Dose into the skin every 14 (fourteen) days. 2 pen 11  . ALPRAZolam (XANAX) 0.5 MG tablet Take 0.5 mg by mouth at bedtime as needed for anxiety or sleep.     Francia Greaves THYROID 90 MG tablet TK 1 T PO QAM ON AN EMPTY STOMACH AND 1/2 T BEFORE LUNCH  3  . divalproex (DEPAKOTE) 500 MG DR tablet TAKE 1 TABLET(500 MG) BY MOUTH DAILY 90 tablet 3  . fexofenadine (ALLEGRA) 60 MG tablet Take 1 tablet (60 mg total) by mouth 2 (two) times daily.    . hydrochlorothiazide (HYDRODIURIL) 25 MG tablet Take 25 mg by mouth at bedtime.     . meloxicam (MOBIC) 15 MG tablet TAKE 1 TABLET(15 MG) BY MOUTH DAILY AS NEEDED FOR PAIN 30 tablet 0  . omeprazole (PRILOSEC) 40 MG capsule TAKE ONE CAPSULE BY MOUTH DAILY 30 capsule 3  . oxybutynin (DITROPAN-XL) 10 MG 24 hr tablet   0   Current Facility-Administered Medications on File Prior to Visit  Medication Dose Route Frequency Provider Last Rate Last Dose  . fluticasone (FLOVENT HFA) 44 MCG/ACT inhaler 2 puff  2 puff Inhalation BID Chesley Mires, MD        LABS/IMAGING: No results found for this or  any previous visit (from the past 48 hour(s)). No results found.  LIPID PANEL:    Component Value Date/Time   CHOL 168 12/31/2018 1040   TRIG 132 12/31/2018 1040   HDL 52 12/31/2018 1040   CHOLHDL 3.2 12/31/2018 1040   LDLCALC 90 12/31/2018 1040    WEIGHTS: Wt Readings from Last 3 Encounters:  03/13/19 214 lb 9.6 oz (97.3 kg)  10/08/18 199 lb 3.2 oz (90.4 kg)  07/25/18 198 lb 3.2 oz (89.9 kg)    VITALS: BP (!) 154/84   Pulse 70   Temp (!) 97.5 F (36.4 C) (Temporal)   Ht 5' 2"  (1.575 m)   Wt 214 lb 9.6 oz (97.3 kg)   BMI 39.25 kg/m   EXAM: Deferred  EKG: Deferred  ASSESSMENT: 1. 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 Family history of premature coronary disease 3. Statin intolerance 4. Multivessel coronary calcification  PLAN: 1.   Mrs. Hershkowitz has had marked improvement in her lipid profile and is tolerating Praluent without difficulty.  Her liver enzymes are stable with AST of 31 down from 33 5 months ago and ALT of 52 which is slightly elevated but down from 66 5 months ago.  No changes in her therapy today.  Plan to repeat lipid profile in 6 months and follow-up at that time in the lipid clinic.  Pixie Casino, MD, [facility], [place] Director of the [facility] Lip[facility] Disorders &  Cardiovascular Risk Reduction Clinic Diplomate of the American Board of Clinical Lipidology Attending Cardiologist  Direct Dial: [phone]  Fax: [phone]  Website:  [email or web address] Flow 03/14/2019, 11:04 AM

## 2019-03-20 ENCOUNTER — Other Ambulatory Visit: Payer: Self-pay | Admitting: Internal Medicine

## 2019-04-03 ENCOUNTER — Other Ambulatory Visit: Payer: Self-pay | Admitting: Internal Medicine

## 2019-05-15 ENCOUNTER — Other Ambulatory Visit (INDEPENDENT_AMBULATORY_CARE_PROVIDER_SITE_OTHER): Payer: Self-pay | Admitting: Orthopaedic Surgery

## 2019-05-15 NOTE — Telephone Encounter (Signed)
Please advise 

## 2019-05-21 NOTE — Telephone Encounter (Signed)
Patient has not been seen since December 2019.  Looks like she had cervical MRI and never followed up with Dr. Lorin Mercy to review.  Needs office visit with him.

## 2019-05-28 ENCOUNTER — Telehealth: Payer: Self-pay | Admitting: Internal Medicine

## 2019-05-28 DIAGNOSIS — E785 Hyperlipidemia, unspecified: Secondary | ICD-10-CM

## 2019-05-28 NOTE — Telephone Encounter (Signed)
praluent patient assistance application mailed with reminder to scheduled 6 month visit in Feb 2021 and complete fasting labs prior (orders mailed)

## 2019-06-03 ENCOUNTER — Other Ambulatory Visit: Payer: Self-pay | Admitting: Internal Medicine

## 2019-06-06 ENCOUNTER — Telehealth: Payer: Self-pay | Admitting: Internal Medicine

## 2019-06-06 NOTE — Telephone Encounter (Signed)
PA for praluent submitted via [email or web address] (Key: [id number])

## 2019-06-09 NOTE — Telephone Encounter (Signed)
PA approved until 07/23/2020

## 2019-07-28 ENCOUNTER — Telehealth: Payer: Medicare PPO | Admitting: Adult Health

## 2019-07-28 NOTE — Progress Notes (Deleted)
[facility]  PATIENT: Alyssa [name] DOB: [birth year]   REASON FOR VISIT: Follow-up for memory loss, post concussion syndrome, headaches HISTORY FROM: Patient  Virtual Visit via Video Note  I connected with [name] on 07/28/19 at  3:45 PM EST by a video enabled telemedicine application located remotely in my own home and verified that I am speaking with the correct person using two identifiers who was located at their own home.   Oliver Hum, RN scheduled visit who discussed the limitations of evaluation and management by telemedicine and the availability of in person appointments. The patient expressed understanding and agreed to proceed.   HISTORY OF PRESENT ILLNESS:   Update 07/28/2019: Ms. Alyssa [name] is a 69 year old female who is being seen today for 1 year follow-up regarding postconcussion syndrome with memory loss and headaches.  She has been doing well since prior visit.  She continues to use Depakote 500 mg nightly managing headaches well.  Memory loss has been stable.  UPDATE 1/2/2020CM Ms. Orsak, 69 year old female returns for follow-up with history of memory loss postconcussion syndrome and headaches.  She is currently on Depakote 500 mg at night with good control of her headaches however she does wake up with some morning headaches occasionally.  She occasionally takes Tylenol with relief.  She denies any daytime drowsiness or snoring.  She claims she feels the best she has in a long time she is exercising.  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  She denies any issues with her job performance.  Her mother recently died.  She returns for reevaluation    Update 12/20/2018CM Ms. Alyssa [name], 68 year old female returns for follow-up with history of headaches and postconcussion syndrome and memory loss.  For her headaches she is taking Depakote but decided to taper herself off of the medication back in the summer, her headaches returned  and she resumed the Depakote.  Her headaches are now in good control her memory loss is stable per MMSE.  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  She has not had no issues performing her job.  She returns for reevaluation and refills   UPDATE 07/19/16 CMMs. Alyssa [name], 69 year old female returns for yearly followup.  Patient fell off of the chair at school back in June2015 and hit her head. CT of the head at that time without abnormalities her headaches returned and she started taking her Depakote again with good benefit. Her memory is stable per MMSE. 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  She has not had any issues performing  her job. She had breast cancer surgery January 2015. She had knee replacement on the right 12/22/2015 She returns for reevaluation and refills   HISTORY: of cognitive changes post head injury. She was initially evaluated by Dr. Leonie [name] in September of 2009. She was operating a push mower when she was going down a ditch in front of her home and toppled over the handle and hit her head on the motor of the mower. At that time she was not sure she loss consciousness or not,she was able to get up go indoors. She did not seek medical help that day, however she did see her primary care the next day when she was complaining of severe headache, appeared to be confused and disoriented and did not recognize the Dr. CT of the head showed no acute  abnormality. She has since had an MRI of the brain which was read as normal as well as an EEG that was normal. She returns today continuing to complain with short-term memory, attention, and concentration difficulties but she continues to work and she has not had any difficulty performing her job duties. She says she manages by using sticky notes, so that she will not forget things. She denies any depression ,she is trying  to exercise and has lost weight since last seen. She does complain of early morning headaches and fatigue, does not feel rested after sleeping although that is better with Trazodone. She is currently on Depakote 500 daily 2 daily for headaches.Her headaches have returned since she is back to teaching after being off for the summer. She says these are stress related. She does not want to increase medication. There is no nausea, photophobia, with the headaches. 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 Fell several weeks ago and hit the back of the head. No obvious injury. See ROS  07/18/12: Returns for followup. MMSE stable. Went off Depakote for a few weeks and headaches returned and mood was terrible. Her PCP gave her a RX to get back on the Depakote. Headaches are stable at present. Continues to work with out problems with performing job. Had hernia repair in March and complication of PE after surgery. Has just gotten off coumadin. No new complaints   REVIEW OF SYSTEMS: Full 14 system review of systems performed and notable only for those listed, all others are neg:  Constitutional: neg  Cardiovascular: neg Ear/Nose/Throat: neg  Skin: neg Eyes: neg Respiratory: neg Gastroitestinal: neg  Hematology/Lymphatic: neg  Endocrine: neg Musculoskeletal:neg Allergy/Immunology: neg Neurological: Memory loss, headaches Psychiatric: neg Sleep : neg   ALLERGIES: Allergies  Allergen Reactions  . Aleve [Naproxen Sodium] Hives, Itching and Swelling  . Aromasin [Exemestane] Anaphylaxis, Itching and Swelling    Pt currently tolerating medication well (02/03/16)-gwd; Pt stated "I can take this medication for a few months at a time and I take a benadryl when I feel it starting to come on" Patient has facial swelling, itching, and throat swelling  07/12/17  Taking this right now and no problem. sy/rn  . Letrozole Shortness Of Breath, Rash and Other (See Comments)    Swelling - feet,  eyes,hands;   Speech garbled  . Levofloxacin Rash and Swelling    Facial swelling and red facial rash  . Penicillins Hives, Swelling and Rash    All over the body. Has patient had a PCN reaction causing immediate rash, facial/tongue/throat swelling, SOB or lightheadedness with hypotension: Yes Has patient had a PCN reaction causing severe rash involving mucus membranes or skin necrosis: No Has patient had a PCN reaction that required hospitalization No Has patient had a PCN reaction occurring within the last 10 years: No If all of the above answers are "NO", then may proceed with Cephalosporin use.   . Symbicort [Budesonide-Formoterol Fumarate] Anaphylaxis, Hives, Swelling and Rash  . Codeine Other (See Comments)    hallucinations  . Percocet [Oxycodone-Acetaminophen] Other (See Comments)    Causes syncope day after medication has been taken.  . Vicodin [Hydrocodone-Acetaminophen] Other (See Comments)    Causes syncope day after medication has been taken.  . Doxycycline Swelling  . Ezetimibe Other (See Comments)    Myalgias,fatigue  . Prednisone Other (See Comments)    Unknown  . Cefdinir Rash and Swelling    HOME MEDICATIONS: Outpatient Medications Prior to Visit  Medication Sig Dispense Refill  . ALPRAZolam (XANAX) 0.5 MG tablet Take 0.5 mg by mouth at bedtime as needed for anxiety or sleep.     Francia Greaves THYROID 90 MG tablet TK 1 T PO QAM ON AN EMPTY STOMACH AND 1/2 T BEFORE LUNCH  3  . divalproex (DEPAKOTE) 500 MG DR tablet TAKE 1 TABLET(500 MG) BY MOUTH DAILY 90 tablet 3  . fexofenadine (ALLEGRA) 60 MG tablet Take 1 tablet (60 mg total) by mouth 2 (two) times daily.    . hydrochlorothiazide (HYDRODIURIL) 25 MG tablet Take 25 mg by mouth at bedtime.     . meloxicam (MOBIC) 15 MG tablet TAKE 1 TABLET(15 MG) BY MOUTH DAILY AS NEEDED FOR PAIN 30 tablet 0  . omeprazole (PRILOSEC) 40 MG capsule TAKE 1 CAPSULE BY MOUTH DAILY 30 capsule 0  . oxybutynin (DITROPAN-XL) 10 MG 24 hr  tablet   0  . PRALUENT 150 MG/ML SOAJ INJECT 1 DOSE INTO THE SKIN EVERY 14 DAYS 2 mL 5   Facility-Administered Medications Prior to Visit  Medication Dose Route Frequency Provider Last Rate Last Admin  . fluticasone (FLOVENT HFA) 44 MCG/ACT inhaler 2 puff  2 puff Inhalation BID Chesley Mires, MD        PAST MEDICAL HISTORY: Past Medical History:  Diagnosis Date  . Abdominal pain   . Breast cancer ([place]) 06/2013   left upper inner  . Cancer ([place])    breast  . Diabetes mellitus without complication ([place])    Takes Tradjenta  . Family history of adverse reaction to anesthesia    Patients mother had to be resusciated after having anesthesia  . GERD (gastroesophageal reflux disease)   . Headache(784.0)    Takes Depakote  . Hernia, incisional periumbilical, incarcerated 4/73/4037  . Hot flashes   . Hx of radiation therapy 09/09/13- 10/13/13   left breast 5000 cGy in 25 sessions, no boost  . Hyperlipidemia   . Hypertension    PCP DR Nolon Rod  . Hypothyroidism   . Peripheral vascular disease ([place])    pulmonary embolis-still have some  . Post concussion syndrome    4 yrs ago- sees [facility]- Dr Alyssa [name] every 6 months-   has sleep study 2011 following accident but was neg per patient-  short term memory issues, dates and times  . Pulmonary embolism ([place]) 2013  . Recurrent upper respiratory infection (URI)    FLU 08/23/11-08/29/11 then URI 08/29/11- 09/08/11- S/P zpack and steroids-  improved      no cough or congestion  . Thyroid disease   . 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 Ventral hernia     PAST SURGICAL HISTORY: Past Surgical History:  Procedure Laterality Date  . APPENDECTOMY  2002   lap appy.  Dr. Hassell Done  . BREAST BIOPSY    . BREAST LUMPECTOMY Left 2015   radiation  . BREAST LUMPECTOMY WITH NEEDLE LOCALIZATION AND AXILLARY SENTINEL LYMPH NODE BX Left 08/04/2013   Procedure: BREAST LUMPECTOMY WITH NEEDLE LOCALIZATION AND AXILLARY SENTINEL LYMPH NODE  Biopsy x 3;  Surgeon: Rolm Bookbinder, MD;  Location: [facility];  Service: General;  Laterality: Left;  . BREAST SURGERY    . CARPAL TUNNEL RELEASE  10/2009-12/2010   left - right  . CESAREAN SECTION  X 2  . COLONOSCOPY    . HERNIA REPAIR  09/15/11   Ventral w/mesh  . JOINT REPLACEMENT    . KNEE ARTHROPLASTY Right 12/22/2015   Procedure: COMPUTER ASSISTED TOTAL  KNEE ARTHROPLASTY;  Surgeon: Marybelle Killings, MD;  Location: [facility];  Service: Orthopedics;  Laterality: Right;  . LAPAROSCOPIC CHOLECYSTECTOMY  2006   Dr. Bubba Camp  . TARSAL TUNNEL RELEASE Bilateral   . THYROIDECTOMY, PARTIAL    . VENTRAL HERNIA REPAIR  09/29/2011   Procedure: LAPAROSCOPIC VENTRAL HERNIA;  Surgeon: Adin Hector, MD;  Location: [facility];  Service: General;  Laterality: N/A;  Laparoscopic Ventral Wall Hernia Repair with Mesh    FAMILY HISTORY: Family History  Problem Relation Age of Onset  . Malignant hyperthermia Mother   . Heart attack Father   . Stroke Father   . Diabetes Other        Grandmother  . Other Other        respiratory- Grandmother  . Cancer Maternal Grandmother 35       breast  . Breast cancer Maternal Grandmother     SOCIAL HISTORY: Social History   Socioeconomic History  . Marital status: Divorced    Spouse name: Not on file  . Number of children: 2  . Years of education: Master's  . Highest education level: Not on file  Occupational History    Employer: [employer]  Tobacco Use  . Smoking status: Never Smoker  . Smokeless tobacco: Never Used  Substance and Sexual Activity  . Alcohol use: No    Alcohol/week: 1.0 standard drinks    Types: 1 Glasses of wine per week  . Drug use: No  . Sexual activity: Yes    Comment: Menarche age 58, first live birth age 46,  [place] P2. She stopped having periods approximately 1999 and took HRT until 2005.  Other Topics Concern  . Not on file  Social History Narrative   Patient is divorced and lives alone.   Patient has two children.   Patient  is a Pharmacist, hospital in [employer].   Patient has a Master's degree.   Patient drinks 3-4 cups of caffeine daily.    Patient is right handed.   Social Determinants of Health   Financial Resource Strain:   . Difficulty of Paying Living Expenses: Not on file  Food Insecurity:   . Worried About Charity fundraiser in the Last Year: Not on file  . Ran Out of Food in the Last Year: Not on file  Transportation Needs:   . Lack of Transportation (Medical): Not on file  . Lack of Transportation (Non-Medical): Not on file  Physical Activity:   . Days of Exercise per Week: Not on file  . Minutes of Exercise per Session: Not on file  Stress:   . Feeling of Stress : Not on file  Social Connections:   . Frequency of Communication with Friends and Family: Not on file  . Frequency of Social Gatherings with Friends and Family: Not on file  . Attends Religious Services: Not on file  . Active Member of Clubs or Organizations: Not on file  . Attends Archivist Meetings: Not on file  . Marital Status: Not on file  Intimate Partner Violence:   . Fear of Current or Ex-Partner: Not on file  . Emotionally Abused: Not on file  . Physically Abused: Not on file  . Sexually Abused: Not on file     PHYSICAL EXAM  There were no vitals filed for this visit. There is no height or weight on file to calculate BMI. Generalized: Well developed, obese female in no acute distress  Head: normocephalic and atraumatic,.  Oropharynx benign  Neck: Supple,  Musculoskeletal: No deformity   Neurological examination   Mentation: Alert  MMSE - Mini Mental State Exam 07/25/2018 07/12/2017 07/19/2016  Orientation to time 5 5 5   Orientation to Place 5 4 5   Registration 3 3 3   Attention/ Calculation 4 4 5   Recall 3 2 2   Language- name 2 objects 2 2 2   Language- repeat 1 1 1   Language- follow 3 step command 3 3 3   Language- read & follow direction 1 1 1   Write a sentence 1 1 1   Copy design  1 1 1   Total score 29 29 29    AFT14 Clock drawing 4 out of 4 . Marland Kitchen Follows all commands speech and language fluent  Cranial nerve II-XII: Pupils were equal round reactive to light extraocular movements were full, visual field were full on confrontational test. Facial sensation and strength were normal. hearing was intact to finger rubbing bilaterally. Uvula tongue midline. head turning and shoulder shrug were normal and symmetric.Tongue protrusion into cheek strength was normal. Motor: normal bulk and tone, full strength in the BUE, BLE, fine finger movements normal, no pronator drift.  Sensory: normal and symmetric to light touch, pinprick and vibratory in the upper and lower extremities Coordination: finger-nose-finger, heel-to-shin bilaterally, no dysmetria Reflexes: 1+ upper lower and symmetric Gait and Station: Rising up from seated position without assistance, normal stance, moderate stride, good arm swing, smooth turning, able to perform tiptoe, and heel walking without difficulty. Tandem gait is steady  DIAGNOSTIC DATA (LABS, IMAGING, TESTING) - I reviewed patient records, labs, notes, testing and imaging myself where available.  Lab Results  Component Value Date   WBC 5.3 10/08/2018   HGB 14.8 10/08/2018   HCT 47.0 (H) 10/08/2018   MCV 87.5 10/08/2018   PLT 280 10/08/2018      Component Value Date/Time   NA 140 10/08/2018 1245   NA 140 07/27/2016 1450   K 3.9 10/08/2018 1245   K 3.4 (L) 07/27/2016 1450   CL 97 (L) 10/08/2018 1245   CO2 29 10/08/2018 1245   CO2 29 07/27/2016 1450   GLUCOSE 102 (H) 10/08/2018 1245   GLUCOSE 85 07/27/2016 1450   BUN 15 10/08/2018 1245   BUN 15.4 07/27/2016 1450   CREATININE 1.45 (H) 10/08/2018 1245   CREATININE 0.8 07/27/2016 1450   CALCIUM 9.9 10/08/2018 1245   CALCIUM 9.8 07/27/2016 1450   PROT 6.6 12/31/2018 1040   PROT 7.4 07/27/2016 1450   ALBUMIN 4.6 12/31/2018 1040   ALBUMIN 4.1 07/27/2016 1450   AST 31 12/31/2018 1040   AST 21  07/27/2016 1450   ALT 52 (H) 12/31/2018 1040   ALT 27 07/27/2016 1450   ALKPHOS 42 12/31/2018 1040   ALKPHOS 59 07/27/2016 1450   BILITOT 0.6 12/31/2018 1040   BILITOT 0.75 07/27/2016 1450   GFRNONAA 37 (L) 10/08/2018 1245   GFRAA 43 (L) 10/08/2018 1245    ASSESSMENT AND PLAN  69 y.o. year old female  has a past medical history of Hyperlipidemia; Post concussion syndrome;  Hypertension; Headache(784.0);  here to follow-up for her memory loss which is stable and her headaches which is stable.   PLANContinue Depakote as ordered will refill for 1 year,  Reviewed most recent CBC and CMP, from 10/2017 and lipid profile from 06/14/18 Memory score remains stable at 29/30   F/U yearly  Frann Rider, [facility]  [facility] [address] [place], [place] [address]  Phone [phone] Fax [phone]  Note: This document was prepared with digital dictation and possible smart phrase technology. Any transcriptional errors that result from this process are unintentional.

## 2019-07-29 ENCOUNTER — Other Ambulatory Visit: Payer: Self-pay | Admitting: *Deleted

## 2019-07-29 NOTE — Telephone Encounter (Signed)
Received refill request , will see 08-04-19 and will refill then.

## 2019-08-04 ENCOUNTER — Telehealth: Payer: Self-pay | Admitting: Adult Health

## 2019-08-04 ENCOUNTER — Telehealth: Payer: Self-pay

## 2019-08-04 ENCOUNTER — Telehealth: Payer: Medicare PPO | Admitting: Adult Health

## 2019-08-04 ENCOUNTER — Other Ambulatory Visit: Payer: Self-pay | Admitting: Internal Medicine

## 2019-08-04 DIAGNOSIS — E785 Hyperlipidemia, unspecified: Secondary | ICD-10-CM

## 2019-08-04 NOTE — Telephone Encounter (Signed)
Patient was a no show for their virtual visit today.

## 2019-08-04 NOTE — Progress Notes (Unsigned)
[facility]  PATIENT: Alyssa [name] DOB: 1951-01-28   REASON FOR VISIT: Follow-up for memory loss, post concussion syndrome, headaches HISTORY FROM: Patient  Virtual Visit via Video Note  I connected with Alyssa [name] on 08/04/19 at  1:15 PM EST by a video enabled telemedicine application located remotely in my own home and verified that I am speaking with the correct person using two identifiers who was located at their own home.   Oliver Hum, RN scheduled visit who discussed the limitations of evaluation and management by telemedicine and the availability of in person appointments. The patient expressed understanding and agreed to proceed.   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  She has been doing well since prior visit.  She continues to use Depakote 500 mg nightly managing headaches well.  Memory loss has been stable.    UPDATE 1/2/2020[name], 69 year old female returns for follow-up with history of memory loss postconcussion syndrome and headaches.  She is currently on Depakote 500 mg at night with good control of her headaches however she does wake up with some morning headaches occasionally.  She occasionally takes Tylenol with relief.  She denies any daytime drowsiness or snoring.  She claims she feels the best she has in a long time she is exercising.  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  She denies any issues with her job performance.  Her mother recently died.  She returns for reevaluation    Update 12/20/2018CM [name], 69 year old female returns for follow-up with history of headaches and postconcussion syndrome and memory loss.  For her headaches she is taking Depakote but decided to taper herself off of the medication back in the summer,  her headaches returned and she resumed the Depakote.  Her headaches are now in good control her memory loss is stable per MMSE.  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  She has not had no issues performing her job.  She returns for reevaluation and refills   UPDATE 07/19/16 CMMs. Alyssa [name], 69 year old female returns for yearly followup.  Patient fell off of the chair at school back in June2015 and hit her head. CT of the head at that time without abnormalities her headaches returned and she started taking her Depakote again with good benefit. Her memory is stable per MMSE. 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  She has not had any issues performing  her job. She had breast cancer surgery January 2015. She had knee replacement on the right 12/22/2015 She returns for reevaluation and refills   HISTORY: of cognitive changes post head injury. She was initially evaluated by Dr. Leonie Man in September of 2009. She was operating a push mower when she was going down a ditch in front of her home and toppled over the handle and hit her head on the motor of the mower. At that time she was not sure she loss consciousness or not,she was able to get up go indoors. She did not seek medical help that day, however she did see her primary care the next day when she was complaining of severe headache, appeared to be confused and disoriented and did not recognize the Dr. CT of  the head showed no acute abnormality. She has since had an MRI of the brain which was read as normal as well as an EEG that was normal. She returns today continuing to complain with short-term memory, attention, and concentration difficulties but she continues to work and she has not had any difficulty performing her job duties. She says she manages by using sticky notes, so that she will not forget things. She denies any  depression ,she is trying to exercise and has lost weight since last seen. She does complain of early morning headaches and fatigue, does not feel rested after sleeping although that is better with Trazodone. She is currently on Depakote 500 daily 2 daily for headaches.Her headaches have returned since she is back to teaching after being off for the summer. She says these are stress related. She does not want to increase medication. There is no nausea, photophobia, with the headaches. 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 Fell several weeks ago and hit the back of the head. No obvious injury. See ROS  07/18/12: Returns for followup. MMSE stable. Went off Depakote for a few weeks and headaches returned and mood was terrible. Her PCP gave her a RX to get back on the Depakote. Headaches are stable at present. Continues to work with out problems with performing job. Had hernia repair in March and complication of PE after surgery. Has just gotten off coumadin. No new complaints   REVIEW OF SYSTEMS: Full 14 system review of systems performed and notable only for those listed, all others are neg:  Constitutional: neg  Cardiovascular: neg Ear/Nose/Throat: neg  Skin: neg Eyes: neg Respiratory: neg Gastroitestinal: neg  Hematology/Lymphatic: neg  Endocrine: neg Musculoskeletal:neg Allergy/Immunology: neg Neurological: Memory loss, headaches Psychiatric: neg Sleep : neg   ALLERGIES: Allergies  Allergen Reactions  . Aleve [Naproxen Sodium] Hives, Itching and Swelling  . Aromasin [Exemestane] Anaphylaxis, Itching and Swelling    Pt currently tolerating medication well (02/03/16)-gwd; Pt stated "I can take this medication for a few months at a time and I take a benadryl when I feel it starting to come on" Patient has facial swelling, itching, and throat swelling  07/12/17  Taking this right now and no problem. sy/rn  . Letrozole Shortness Of Breath, Rash and Other (See Comments)      Swelling - feet, eyes,hands;   Speech garbled  . Levofloxacin Rash and Swelling    Facial swelling and red facial rash  . Penicillins Hives, Swelling and Rash    All over the body. Has patient had a PCN reaction causing immediate rash, facial/tongue/throat swelling, SOB or lightheadedness with hypotension: Yes Has patient had a PCN reaction causing severe rash involving mucus membranes or skin necrosis: No Has patient had a PCN reaction that required hospitalization No Has patient had a PCN reaction occurring within the last 10 years: No If all of the above answers are "NO", then may proceed with Cephalosporin use.   . Symbicort [Budesonide-Formoterol Fumarate] Anaphylaxis, Hives, Swelling and Rash  . Codeine Other (See Comments)    hallucinations  . Percocet [Oxycodone-Acetaminophen] Other (See Comments)    Causes syncope day after medication has been taken.  . Vicodin [Hydrocodone-Acetaminophen] Other (See Comments)    Causes syncope day after medication has been taken.  . Doxycycline Swelling  . Ezetimibe Other (See Comments)    Myalgias,fatigue  . Prednisone Other (See Comments)    Unknown  . Cefdinir Rash and Swelling    HOME  MEDICATIONS: Outpatient Medications Prior to Visit  Medication Sig Dispense Refill  . ALPRAZolam (XANAX) 0.5 MG tablet Take 0.5 mg by mouth at bedtime as needed for anxiety or sleep.     Francia Greaves THYROID 90 MG tablet TK 1 T PO QAM ON AN EMPTY STOMACH AND 1/2 T BEFORE LUNCH  3  . divalproex (DEPAKOTE) 500 MG DR tablet TAKE 1 TABLET(500 MG) BY MOUTH DAILY 90 tablet 3  . fexofenadine (ALLEGRA) 60 MG tablet Take 1 tablet (60 mg total) by mouth 2 (two) times daily.    . hydrochlorothiazide (HYDRODIURIL) 25 MG tablet Take 25 mg by mouth at bedtime.     . meloxicam (MOBIC) 15 MG tablet TAKE 1 TABLET(15 MG) BY MOUTH DAILY AS NEEDED FOR PAIN 30 tablet 0  . omeprazole (PRILOSEC) 40 MG capsule TAKE 1 CAPSULE BY MOUTH DAILY 30 capsule 0  . oxybutynin  (DITROPAN-XL) 10 MG 24 hr tablet   0  . PRALUENT 150 MG/ML SOAJ INJECT 1 DOSE INTO THE SKIN EVERY 14 DAYS 2 mL 5   Facility-Administered Medications Prior to Visit  Medication Dose Route Frequency Provider Last Rate Last Admin  . fluticasone (FLOVENT HFA) 44 MCG/ACT inhaler 2 puff  2 puff Inhalation BID Chesley Mires, MD        PAST MEDICAL HISTORY: Past Medical History:  Diagnosis Date  . Abdominal pain   . Breast cancer ([place]) 06/2013   left upper inner  . Cancer ([place])    breast  . Diabetes mellitus without complication ([place])    Takes Tradjenta  . Family history of adverse reaction to anesthesia    Patients mother had to be resusciated after having anesthesia  . GERD (gastroesophageal reflux disease)   . Headache(784.0)    Takes Depakote  . Hernia, incisional periumbilical, incarcerated 9/92/4268  . Hot flashes   . Hx of radiation therapy 09/09/13- 10/13/13   left breast 5000 cGy in 25 sessions, no boost  . Hyperlipidemia   . Hypertension    PCP DR Nolon Rod  . Hypothyroidism   . Peripheral vascular disease ([place])    pulmonary embolis-still have some  . Post concussion syndrome    4 yrs ago- sees [facility]- Dr Leonie Man every 6 months-   has sleep study 2011 following accident but was neg per patient-  short term memory issues, dates and times  . Pulmonary embolism ([place]) 2013  . Recurrent upper respiratory infection (URI)    FLU 08/23/11-08/29/11 then URI 08/29/11- 09/08/11- S/P zpack and steroids-  improved      no cough or congestion  . Thyroid disease   . 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 Ventral hernia     PAST SURGICAL HISTORY: Past Surgical History:  Procedure Laterality Date  . APPENDECTOMY  2002   lap appy.  Dr. Hassell Done  . BREAST BIOPSY    . BREAST LUMPECTOMY Left 2015   radiation  . BREAST LUMPECTOMY WITH NEEDLE LOCALIZATION AND AXILLARY SENTINEL LYMPH NODE BX Left 08/04/2013   Procedure: BREAST LUMPECTOMY WITH NEEDLE LOCALIZATION AND  AXILLARY SENTINEL LYMPH NODE Biopsy x 3;  Surgeon: Rolm Bookbinder, MD;  Location: [facility];  Service: General;  Laterality: Left;  . BREAST SURGERY    . CARPAL TUNNEL RELEASE  10/2009-12/2010   left - right  . CESAREAN SECTION  X 2  . COLONOSCOPY    . HERNIA REPAIR  09/15/11   Ventral w/mesh  . JOINT REPLACEMENT    . KNEE ARTHROPLASTY Right  12/22/2015   Procedure: COMPUTER ASSISTED TOTAL KNEE ARTHROPLASTY;  Surgeon: Marybelle Killings, MD;  Location: [facility];  Service: Orthopedics;  Laterality: Right;  . LAPAROSCOPIC CHOLECYSTECTOMY  2006   Dr. Bubba Camp  . TARSAL TUNNEL RELEASE Bilateral   . THYROIDECTOMY, PARTIAL    . VENTRAL HERNIA REPAIR  09/29/2011   Procedure: LAPAROSCOPIC VENTRAL HERNIA;  Surgeon: Adin Hector, MD;  Location: [facility];  Service: General;  Laterality: N/A;  Laparoscopic Ventral Wall Hernia Repair with Mesh    FAMILY HISTORY: Family History  Problem Relation Age of Onset  . Malignant hyperthermia Mother   . Heart attack Father   . Stroke Father   . Diabetes Other        Grandmother  . Other Other        respiratory- Grandmother  . Cancer Maternal Grandmother 46       breast  . Breast cancer Maternal Grandmother     SOCIAL HISTORY: Social History   Socioeconomic History  . Marital status: Divorced    Spouse name: Not on file  . Number of children: 2  . Years of education: Master's  . Highest education level: Not on file  Occupational History    Employer: [employer]  Tobacco Use  . Smoking status: Never Smoker  . Smokeless tobacco: Never Used  Substance and Sexual Activity  . Alcohol use: No    Alcohol/week: 1.0 standard drinks    Types: 1 Glasses of wine per week  . Drug use: No  . Sexual activity: Yes    Comment: Menarche age 74, first live birth age 6,  Dyess P2. She stopped having periods approximately 1999 and took HRT until 2005.  Other Topics Concern  . Not on file  Social History Narrative   Patient is divorced and lives alone.   Patient  has two children.   Patient is a Pharmacist, hospital in [employer].   Patient has a Master's degree.   Patient drinks 3-4 cups of caffeine daily.    Patient is right handed.   Social Determinants of Health   Financial Resource Strain:   . Difficulty of Paying Living Expenses: Not on file  Food Insecurity:   . Worried About Charity fundraiser in the Last Year: Not on file  . Ran Out of Food in the Last Year: Not on file  Transportation Needs:   . Lack of Transportation (Medical): Not on file  . Lack of Transportation (Non-Medical): Not on file  Physical Activity:   . Days of Exercise per Week: Not on file  . Minutes of Exercise per Session: Not on file  Stress:   . Feeling of Stress : Not on file  Social Connections:   . Frequency of Communication with Friends and Family: Not on file  . Frequency of Social Gatherings with Friends and Family: Not on file  . Attends Religious Services: Not on file  . Active Member of Clubs or Organizations: Not on file  . Attends Archivist Meetings: Not on file  . Marital Status: Not on file  Intimate Partner Violence:   . Fear of Current or Ex-Partner: Not on file  . Emotionally Abused: Not on file  . Physically Abused: Not on file  . Sexually Abused: Not on file     PHYSICAL EXAM  There were no vitals filed for this visit. There is no height or weight on file to calculate BMI. Generalized: Well developed, obese female in no  acute distress  Head: normocephalic and atraumatic,. Oropharynx benign  Neck: Supple,  Musculoskeletal: No deformity   Neurological examination   Mentation: Alert  MMSE - Mini Mental State Exam 07/25/2018 07/12/2017 07/19/2016  Orientation to time 5 5 5   Orientation to Place 5 4 5   Registration 3 3 3   Attention/ Calculation 4 4 5   Recall 3 2 2   Language- name 2 objects 2 2 2   Language- repeat 1 1 1   Language- follow 3 step command 3 3 3   Language- read & follow direction 1 1 1   Write a  sentence 1 1 1   Copy design 1 1 1   Total score 29 29 29    AFT14 Clock drawing 4 out of 4 . Marland Kitchen Follows all commands speech and language fluent  Cranial nerve II-XII: Pupils were equal round reactive to light extraocular movements were full, visual field were full on confrontational test. Facial sensation and strength were normal. hearing was intact to finger rubbing bilaterally. Uvula tongue midline. head turning and shoulder shrug were normal and symmetric.Tongue protrusion into cheek strength was normal. Motor: normal bulk and tone, full strength in the BUE, BLE, fine finger movements normal, no pronator drift.  Sensory: normal and symmetric to light touch, pinprick and vibratory in the upper and lower extremities Coordination: finger-nose-finger, heel-to-shin bilaterally, no dysmetria Reflexes: 1+ upper lower and symmetric Gait and Station: Rising up from seated position without assistance, normal stance, moderate stride, good arm swing, smooth turning, able to perform tiptoe, and heel walking without difficulty. Tandem gait is steady  DIAGNOSTIC DATA (LABS, IMAGING, TESTING) - I reviewed patient records, labs, notes, testing and imaging myself where available.  Lab Results  Component Value Date   WBC 5.3 10/08/2018   HGB 14.8 10/08/2018   HCT 47.0 (H) 10/08/2018   MCV 87.5 10/08/2018   PLT 280 10/08/2018      Component Value Date/Time   NA 140 10/08/2018 1245   NA 140 07/27/2016 1450   K 3.9 10/08/2018 1245   K 3.4 (L) 07/27/2016 1450   CL 97 (L) 10/08/2018 1245   CO2 29 10/08/2018 1245   CO2 29 07/27/2016 1450   GLUCOSE 102 (H) 10/08/2018 1245   GLUCOSE 85 07/27/2016 1450   BUN 15 10/08/2018 1245   BUN 15.4 07/27/2016 1450   CREATININE 1.45 (H) 10/08/2018 1245   CREATININE 0.8 07/27/2016 1450   CALCIUM 9.9 10/08/2018 1245   CALCIUM 9.8 07/27/2016 1450   PROT 6.6 12/31/2018 1040   PROT 7.4 07/27/2016 1450   ALBUMIN 4.6 12/31/2018 1040   ALBUMIN 4.1 07/27/2016 1450   AST  31 12/31/2018 1040   AST 21 07/27/2016 1450   ALT 52 (H) 12/31/2018 1040   ALT 27 07/27/2016 1450   ALKPHOS 42 12/31/2018 1040   ALKPHOS 59 07/27/2016 1450   BILITOT 0.6 12/31/2018 1040   BILITOT 0.75 07/27/2016 1450   GFRNONAA 37 (L) 10/08/2018 1245   GFRAA 43 (L) 10/08/2018 1245    ASSESSMENT AND PLAN  69 y.o. year old female  has a past medical history of Hyperlipidemia; Post concussion syndrome;  Hypertension; Headache(784.0);  here to follow-up for her memory loss which is stable and her headaches which is stable.   PLANContinue Depakote as ordered will refill for 1 year,  Reviewed most recent CBC and CMP, from 10/2017 and lipid profile from 06/14/18 Memory score remains stable at 29/30   F/U yearly  Frann Rider, [facility]  [facility] [address] [place],  [place] [address]  Phone [phone] Fax [phone] Note: This document was prepared with digital dictation and possible smart phrase technology. Any transcriptional errors that result from this process are unintentional.

## 2019-08-12 LAB — LIPID PANEL
Chol/HDL Ratio: 3 ratio (ref 0.0–4.4)
Cholesterol, Total: 151 mg/dL (ref 100–199)
HDL: 51 mg/dL (ref >39)
LDL Chol Calc (NIH): 75 mg/dL (ref 0–99)
Triglycerides: 142 mg/dL (ref 0–149)
VLDL Cholesterol Cal: 25 mg/dL (ref 5–40)

## 2019-08-18 ENCOUNTER — Telehealth: Payer: Self-pay | Admitting: Adult Health

## 2019-08-18 ENCOUNTER — Telehealth: Payer: Self-pay | Admitting: Internal Medicine

## 2019-08-18 NOTE — Progress Notes (Deleted)
[facility]  PATIENT: Alyssa [name] DOB: 09-23-1950   REASON FOR VISIT: Follow-up for memory loss, post concussion syndrome, headaches HISTORY FROM: Patient  Virtual Visit via Video Note  I connected with [name] on 08/18/19 at  9:15 AM EST by a video enabled telemedicine application located remotely in my own home and verified that I am speaking with the correct person using two identifiers who was located at their own home.   Oliver Hum, RN scheduled visit who discussed the limitations of evaluation and management by telemedicine and the availability of in person appointments. The patient expressed understanding and agreed to proceed.   HISTORY OF PRESENT ILLNESS:   Update via virtual visit 08/18/2019: Alyssa [name] is a 69 year old female who is being seen today for 1 year follow-up regarding postconcussion syndrome with memory loss and headaches.  She has been doing well since prior visit.  She continues to use Depakote 500 mg nightly managing headaches well.  Memory loss has been stable.    UPDATE 1/2/2020[name], 69 year old female returns for follow-up with history of memory loss postconcussion syndrome and headaches.  She is currently on Depakote 500 mg at night with good control of her headaches however she does wake up with some morning headaches occasionally.  She occasionally takes Tylenol with relief.  She denies any daytime drowsiness or snoring.  She claims she feels the best she has in a long time she is exercising.  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  She denies any issues with her job performance.  Her mother recently died.  She returns for reevaluation    Update 12/20/2018[name], 69 year old female returns for follow-up with history of headaches and postconcussion syndrome and memory loss.  For her headaches she is taking Depakote but decided to taper herself off of the medication back in the summer,  her headaches returned and she resumed the Depakote.  Her headaches are now in good control her memory loss is stable per MMSE.  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  She has not had no issues performing her job.  She returns for reevaluation and refills   UPDATE 07/19/16 CMMs. Alyssa [name], 69 year old female returns for yearly followup.  Patient fell off of the chair at school back in June2015 and hit her head. CT of the head at that time without abnormalities her headaches returned and she started taking her Depakote again with good benefit. Her memory is stable per MMSE. 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  She has not had any issues performing  her job. She had breast cancer surgery January 2015. She had knee replacement on the right 12/22/2015 She returns for reevaluation and refills   HISTORY: of cognitive changes post head injury. She was initially evaluated by Dr. Leonie Man in September of 2009. She was operating a push mower when she was going down a ditch in front of her home and toppled over the handle and hit her head on the motor of the mower. At that time she was not sure she loss consciousness or not,she was able to get up go indoors. She did not seek medical help that day, however she did see her primary care the next day when she was complaining of severe headache, appeared to be confused and disoriented and did not recognize the Dr. CT of  the head showed no acute abnormality. She has since had an MRI of the brain which was read as normal as well as an EEG that was normal. She returns today continuing to complain with short-term memory, attention, and concentration difficulties but she continues to work and she has not had any difficulty performing her job duties. She says she manages by using sticky notes, so that she will not forget things. She denies any  depression ,she is trying to exercise and has lost weight since last seen. She does complain of early morning headaches and fatigue, does not feel rested after sleeping although that is better with Trazodone. She is currently on Depakote 500 daily 2 daily for headaches.Her headaches have returned since she is back to teaching after being off for the summer. She says these are stress related. She does not want to increase medication. There is no nausea, photophobia, with the headaches. 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 Fell several weeks ago and hit the back of the head. No obvious injury. See ROS  07/18/12: Returns for followup. MMSE stable. Went off Depakote for a few weeks and headaches returned and mood was terrible. Her PCP gave her a RX to get back on the Depakote. Headaches are stable at present. Continues to work with out problems with performing job. Had hernia repair in March and complication of PE after surgery. Has just gotten off coumadin. No new complaints   REVIEW OF SYSTEMS: Full 14 system review of systems performed and notable only for those listed, all others are neg:  Constitutional: neg  Cardiovascular: neg Ear/Nose/Throat: neg  Skin: neg Eyes: neg Respiratory: neg Gastroitestinal: neg  Hematology/Lymphatic: neg  Endocrine: neg Musculoskeletal:neg Allergy/Immunology: neg Neurological: Memory loss, headaches Psychiatric: neg Sleep : neg   ALLERGIES: Allergies  Allergen Reactions  . Aleve [Naproxen Sodium] Hives, Itching and Swelling  . Aromasin [Exemestane] Anaphylaxis, Itching and Swelling    Pt currently tolerating medication well (02/03/16)-gwd; Pt stated "I can take this medication for a few months at a time and I take a benadryl when I feel it starting to come on" Patient has facial swelling, itching, and throat swelling  07/12/17  Taking this right now and no problem. sy/rn  . Letrozole Shortness Of Breath, Rash and Other (See Comments)      Swelling - feet, eyes,hands;   Speech garbled  . Levofloxacin Rash and Swelling    Facial swelling and red facial rash  . Penicillins Hives, Swelling and Rash    All over the body. Has patient had a PCN reaction causing immediate rash, facial/tongue/throat swelling, SOB or lightheadedness with hypotension: Yes Has patient had a PCN reaction causing severe rash involving mucus membranes or skin necrosis: No Has patient had a PCN reaction that required hospitalization No Has patient had a PCN reaction occurring within the last 10 years: No If all of the above answers are "NO", then may proceed with Cephalosporin use.   . Symbicort [Budesonide-Formoterol Fumarate] Anaphylaxis, Hives, Swelling and Rash  . Codeine Other (See Comments)    hallucinations  . Percocet [Oxycodone-Acetaminophen] Other (See Comments)    Causes syncope day after medication has been taken.  . Vicodin [Hydrocodone-Acetaminophen] Other (See Comments)    Causes syncope day after medication has been taken.  . Doxycycline Swelling  . Ezetimibe Other (See Comments)    Myalgias,fatigue  . Prednisone Other (See Comments)    Unknown  . Cefdinir Rash and Swelling    HOME  MEDICATIONS: Outpatient Medications Prior to Visit  Medication Sig Dispense Refill  . ALPRAZolam (XANAX) 0.5 MG tablet Take 0.5 mg by mouth at bedtime as needed for anxiety or sleep.     Francia Greaves THYROID 90 MG tablet TK 1 T PO QAM ON AN EMPTY STOMACH AND 1/2 T BEFORE LUNCH  3  . divalproex (DEPAKOTE) 500 MG DR tablet TAKE 1 TABLET(500 MG) BY MOUTH DAILY 90 tablet 3  . fexofenadine (ALLEGRA) 60 MG tablet Take 1 tablet (60 mg total) by mouth 2 (two) times daily.    . hydrochlorothiazide (HYDRODIURIL) 25 MG tablet Take 25 mg by mouth at bedtime.     . meloxicam (MOBIC) 15 MG tablet TAKE 1 TABLET(15 MG) BY MOUTH DAILY AS NEEDED FOR PAIN 30 tablet 0  . omeprazole (PRILOSEC) 40 MG capsule TAKE 1 CAPSULE BY MOUTH DAILY 30 capsule 0  . oxybutynin  (DITROPAN-XL) 10 MG 24 hr tablet   0  . PRALUENT 150 MG/ML SOAJ INJECT 1 DOSE INTO THE SKIN EVERY 14 DAYS 2 mL 5   Facility-Administered Medications Prior to Visit  Medication Dose Route Frequency Provider Last Rate Last Admin  . fluticasone (FLOVENT HFA) 44 MCG/ACT inhaler 2 puff  2 puff Inhalation BID Chesley Mires, MD        PAST MEDICAL HISTORY: Past Medical History:  Diagnosis Date  . Abdominal pain   . Breast cancer ([place]) 06/2013   left upper inner  . Cancer ([place])    breast  . Diabetes mellitus without complication ([place])    Takes Tradjenta  . Family history of adverse reaction to anesthesia    Patients mother had to be resusciated after having anesthesia  . GERD (gastroesophageal reflux disease)   . Headache(784.0)    Takes Depakote  . Hernia, incisional periumbilical, incarcerated 1/96/2229  . Hot flashes   . Hx of radiation therapy 09/09/13- 10/13/13   left breast 5000 cGy in 25 sessions, no boost  . Hyperlipidemia   . Hypertension    PCP DR Nolon Rod  . Hypothyroidism   . Peripheral vascular disease ([place])    pulmonary embolis-still have some  . Post concussion syndrome    4 yrs ago- sees [facility]- Dr Leonie Man every 6 months-   has sleep study 2011 following accident but was neg per patient-  short term memory issues, dates and times  . Pulmonary embolism ([place]) 2013  . Recurrent upper respiratory infection (URI)    FLU 08/23/11-08/29/11 then URI 08/29/11- 09/08/11- S/P zpack and steroids-  improved      no cough or congestion  . Thyroid disease   . 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 Ventral hernia     PAST SURGICAL HISTORY: Past Surgical History:  Procedure Laterality Date  . APPENDECTOMY  2002   lap appy.  Dr. Hassell Done  . BREAST BIOPSY    . BREAST LUMPECTOMY Left 2015   radiation  . BREAST LUMPECTOMY WITH NEEDLE LOCALIZATION AND AXILLARY SENTINEL LYMPH NODE BX Left 08/04/2013   Procedure: BREAST LUMPECTOMY WITH NEEDLE LOCALIZATION AND  AXILLARY SENTINEL LYMPH NODE Biopsy x 3;  Surgeon: Rolm Bookbinder, MD;  Location: [facility];  Service: General;  Laterality: Left;  . BREAST SURGERY    . CARPAL TUNNEL RELEASE  10/2009-12/2010   left - right  . CESAREAN SECTION  X 2  . COLONOSCOPY    . HERNIA REPAIR  09/15/11   Ventral w/mesh  . JOINT REPLACEMENT    . KNEE ARTHROPLASTY Right  12/22/2015   Procedure: COMPUTER ASSISTED TOTAL KNEE ARTHROPLASTY;  Surgeon: Marybelle Killings, MD;  Location: [facility];  Service: Orthopedics;  Laterality: Right;  . LAPAROSCOPIC CHOLECYSTECTOMY  2006   Dr. Bubba Camp  . TARSAL TUNNEL RELEASE Bilateral   . THYROIDECTOMY, PARTIAL    . VENTRAL HERNIA REPAIR  09/29/2011   Procedure: LAPAROSCOPIC VENTRAL HERNIA;  Surgeon: Adin Hector, MD;  Location: [facility];  Service: General;  Laterality: N/A;  Laparoscopic Ventral Wall Hernia Repair with Mesh    FAMILY HISTORY: Family History  Problem Relation Age of Onset  . Malignant hyperthermia Mother   . Heart attack Father   . Stroke Father   . Diabetes Other        Grandmother  . Other Other        respiratory- Grandmother  . Cancer Maternal Grandmother 18       breast  . Breast cancer Maternal Grandmother     SOCIAL HISTORY: Social History   Socioeconomic History  . Marital status: Divorced    Spouse name: Not on file  . Number of children: 2  . Years of education: Master's  . Highest education level: Not on file  Occupational History    Employer: [employer]  Tobacco Use  . Smoking status: Never Smoker  . Smokeless tobacco: Never Used  Substance and Sexual Activity  . Alcohol use: No    Alcohol/week: 1.0 standard drinks    Types: 1 Glasses of wine per week  . Drug use: No  . Sexual activity: Yes    Comment: Menarche age 7, first live birth age 60,  [place] P2. She stopped having periods approximately 1999 and took HRT until 2005.  Other Topics Concern  . Not on file  Social History Narrative   Patient is divorced and lives alone.   Patient  has two children.   Patient is a Pharmacist, hospital in [employer].   Patient has a Master's degree.   Patient drinks 3-4 cups of caffeine daily.    Patient is right handed.   Social Determinants of Health   Financial Resource Strain:   . Difficulty of Paying Living Expenses: Not on file  Food Insecurity:   . Worried About Charity fundraiser in the Last Year: Not on file  . Ran Out of Food in the Last Year: Not on file  Transportation Needs:   . Lack of Transportation (Medical): Not on file  . Lack of Transportation (Non-Medical): Not on file  Physical Activity:   . Days of Exercise per Week: Not on file  . Minutes of Exercise per Session: Not on file  Stress:   . Feeling of Stress : Not on file  Social Connections:   . Frequency of Communication with Friends and Family: Not on file  . Frequency of Social Gatherings with Friends and Family: Not on file  . Attends Religious Services: Not on file  . Active Member of Clubs or Organizations: Not on file  . Attends Archivist Meetings: Not on file  . Marital Status: Not on file  Intimate Partner Violence:   . Fear of Current or Ex-Partner: Not on file  . Emotionally Abused: Not on file  . Physically Abused: Not on file  . Sexually Abused: Not on file     PHYSICAL EXAM  There were no vitals filed for this visit. There is no height or weight on file to calculate BMI. Generalized: Well developed, obese female in no  acute distress  Head: normocephalic and atraumatic,. Oropharynx benign  Neck: Supple,  Musculoskeletal: No deformity   Neurological examination   Mentation: Alert  MMSE - Mini Mental State Exam 07/25/2018 07/12/2017 07/19/2016  Orientation to time 5 5 5   Orientation to Place 5 4 5   Registration 3 3 3   Attention/ Calculation 4 4 5   Recall 3 2 2   Language- name 2 objects 2 2 2   Language- repeat 1 1 1   Language- follow 3 step command 3 3 3   Language- read & follow direction 1 1 1   Write a  sentence 1 1 1   Copy design 1 1 1   Total score 29 29 29    AFT14 Clock drawing 4 out of 4 . Marland Kitchen Follows all commands speech and language fluent  Cranial nerve II-XII: Pupils were equal round reactive to light extraocular movements were full, visual field were full on confrontational test. Facial sensation and strength were normal. hearing was intact to finger rubbing bilaterally. Uvula tongue midline. head turning and shoulder shrug were normal and symmetric.Tongue protrusion into cheek strength was normal. Motor: normal bulk and tone, full strength in the BUE, BLE, fine finger movements normal, no pronator drift.  Sensory: normal and symmetric to light touch, pinprick and vibratory in the upper and lower extremities Coordination: finger-nose-finger, heel-to-shin bilaterally, no dysmetria Reflexes: 1+ upper lower and symmetric Gait and Station: Rising up from seated position without assistance, normal stance, moderate stride, good arm swing, smooth turning, able to perform tiptoe, and heel walking without difficulty. Tandem gait is steady  DIAGNOSTIC DATA (LABS, IMAGING, TESTING) - I reviewed patient records, labs, notes, testing and imaging myself where available.  Lab Results  Component Value Date   WBC 5.3 10/08/2018   HGB 14.8 10/08/2018   HCT 47.0 (H) 10/08/2018   MCV 87.5 10/08/2018   PLT 280 10/08/2018      Component Value Date/Time   NA 140 10/08/2018 1245   NA 140 07/27/2016 1450   K 3.9 10/08/2018 1245   K 3.4 (L) 07/27/2016 1450   CL 97 (L) 10/08/2018 1245   CO2 29 10/08/2018 1245   CO2 29 07/27/2016 1450   GLUCOSE 102 (H) 10/08/2018 1245   GLUCOSE 85 07/27/2016 1450   BUN 15 10/08/2018 1245   BUN 15.4 07/27/2016 1450   CREATININE 1.45 (H) 10/08/2018 1245   CREATININE 0.8 07/27/2016 1450   CALCIUM 9.9 10/08/2018 1245   CALCIUM 9.8 07/27/2016 1450   PROT 6.6 12/31/2018 1040   PROT 7.4 07/27/2016 1450   ALBUMIN 4.6 12/31/2018 1040   ALBUMIN 4.1 07/27/2016 1450   AST  31 12/31/2018 1040   AST 21 07/27/2016 1450   ALT 52 (H) 12/31/2018 1040   ALT 27 07/27/2016 1450   ALKPHOS 42 12/31/2018 1040   ALKPHOS 59 07/27/2016 1450   BILITOT 0.6 12/31/2018 1040   BILITOT 0.75 07/27/2016 1450   GFRNONAA 37 (L) 10/08/2018 1245   GFRAA 43 (L) 10/08/2018 1245    ASSESSMENT AND PLAN  69 y.o. year old female  has a past medical history of Hyperlipidemia; Post concussion syndrome;  Hypertension; Headache(784.0);  here to follow-up for her memory loss which is stable and her headaches which is stable.   PLANContinue Depakote as ordered will refill for 1 year,  Reviewed most recent CBC and CMP, from 10/2017 and lipid profile from 06/14/18 Memory score remains stable at 29/30   F/U yearly  Frann Rider, [facility]  [facility] [address] [place],  [place] [address]  Phone [phone] Fax [phone] Note: This document was prepared with digital dictation and possible smart phrase technology. Any transcriptional errors that result from this process are unintentional.

## 2019-08-18 NOTE — Telephone Encounter (Signed)
Patient calling in to state that her insurance is now through [place], She got a letter in the mail stating that [facility] will not cover [employer] but rather will cover [place]. She is wanting to make this switch and would like a call back to get assistance and authorization to make this switch.

## 2019-08-19 MED ORDER — REPATHA SURECLICK 140 MG/ML ~~LOC~~ SOAJ: 1.0000 | SUBCUTANEOUS | 11 refills | Status: DC

## 2019-08-19 NOTE — Addendum Note (Signed)
Addended by: Fidel Levy on: 08/19/2019 02:34 PM   Modules accepted: Orders

## 2019-08-19 NOTE — Telephone Encounter (Signed)
Spoke with patient who has new insurance  ID: [id number] [place]: [id number] PCN: [id number] RxGrp: [id number]

## 2019-08-19 NOTE — Telephone Encounter (Signed)
Attempted PA on CMM for Repatha [employer]  Message received "Authorization already on file for this request." Patient notified via [place]

## 2019-08-19 NOTE — Telephone Encounter (Signed)
Rx for Repatha sent to [facility]

## 2019-08-21 ENCOUNTER — Other Ambulatory Visit: Payer: Self-pay | Admitting: Oncology

## 2019-08-21 DIAGNOSIS — Z853 Personal history of malignant neoplasm of breast: Secondary | ICD-10-CM

## 2019-08-21 DIAGNOSIS — Z9889 Other specified postprocedural states: Secondary | ICD-10-CM

## 2019-08-25 NOTE — Progress Notes (Signed)
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  At prior visit, she was doing well in regards to stable headaches and memory.  She endorses worsening headaches since the beginning of December which are generalized associated with photophobia and phonophobia with similar characteristics compared to her prior headaches.  She has been using Tylenol 1000 mg twice daily with only mild benefit.  She does endorse increased stress as she is a high school special ed teacher and had difficulty with the virtual learning as she felt "isolated".  She has now since returned to in class learning and has been feeling better since that time but she continues to experience short-term memory loss and is concerned regarding overall worsening memory. MMSE today 29/30.  She continues to use Depakote DR 500 mg nightly but previously on ER formulation.  She denies depression or anxiety and denies any history or family history.  She does endorse increased stress.  Recently started on Repatha by cardiology with excellent improvement of cholesterol levels.  Blood pressure today satisfactory at 132/84.  No further concerns at this time.   UPDATE 1/2/2020[name], 69 year old female returns for follow-up with history of memory loss postconcussion syndrome and headaches.  She is currently on Depakote 500 mg at night with good control of her headaches however she does wake up with some morning headaches occasionally.  She occasionally takes Tylenol with relief.  She denies any daytime drowsiness or snoring.  She claims she feels the best she has in a long time she is exercising.  She continues to work  she teaches in [place] special education.  She denies any issues with her job performance.  Her mother recently died.  She returns for reevaluation    Update 12/20/2018[name], 69 year old female returns for follow-up with history of headaches and postconcussion syndrome and memory loss.  For her headaches she is taking Depakote but decided to taper herself off of the medication back in the summer, her headaches returned and she resumed the Depakote.  Her headaches are now in good control her memory loss is stable per MMSE.  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  She has not had no issues performing her job.  She returns for reevaluation and refills   UPDATE 07/19/16 [name], 69 year old female returns for yearly followup.  Patient fell off of the chair at school back in June2015 and hit her head. CT of the head at that time without abnormalities her headaches returned and she started taking her Depakote again with good benefit. Her memory is stable per MMSE. 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  She has not had any issues performing  her job. She had breast cancer surgery January 2015. She had knee replacement on the right 12/22/2015 She returns for reevaluation and refills   HISTORY: of cognitive changes post head injury. She was initially evaluated by Dr. Leonie Man in September of 2009. She was operating a push mower when she was going down a ditch in front of her home and toppled over the handle and hit her head  on the motor of the mower. At that time she was not sure she loss consciousness or not,she was able to get up go indoors. She did not seek medical help that day, however she did see her primary care the next day when she was complaining of severe headache, appeared to be confused and disoriented and did not recognize the Dr. CT of the head showed no  acute abnormality. She has since had an MRI of the brain which was read as normal as well as an EEG that was normal. She returns today continuing to complain with short-term memory, attention, and concentration difficulties but she continues to work and she has not had any difficulty performing her job duties. She says she manages by using sticky notes, so that she will not forget things. She denies any depression ,she is trying to exercise and has lost weight since last seen. She does complain of early morning headaches and fatigue, does not feel rested after sleeping although that is better with Trazodone. She is currently on Depakote 500 daily 2 daily for headaches.Her headaches have returned since she is back to teaching after being off for the summer. She says these are stress related. She does not want to increase medication. There is no nausea, photophobia, with the headaches. 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 Fell several weeks ago and hit the back of the head. No obvious injury. See ROS  07/18/12: Returns for followup. MMSE stable. Went off Depakote for a few weeks and headaches returned and mood was terrible. Her PCP gave her a RX to get back on the Depakote. Headaches are stable at present. Continues to work with out problems with performing job. Had hernia repair in March and complication of PE after surgery. Has just gotten off coumadin. No new complaints   REVIEW OF SYSTEMS: Full 14 system review of systems performed and notable only for those listed, all others are neg:  Constitutional: neg  Cardiovascular: neg Ear/Nose/Throat: neg  Skin: neg Eyes: neg Respiratory: neg Gastroitestinal: neg  Hematology/Lymphatic: neg  Endocrine: neg Musculoskeletal:neg Allergy/Immunology: neg Neurological: Memory loss, headaches Psychiatric: Stress Sleep : neg   ALLERGIES: Allergies  Allergen Reactions  . Aleve [Naproxen Sodium] Hives, Itching and Swelling  .  Aromasin [Exemestane] Anaphylaxis, Itching and Swelling    Pt currently tolerating medication well (02/03/16)-gwd; Pt stated "I can take this medication for a few months at a time and I take a benadryl when I feel it starting to come on" Patient has facial swelling, itching, and throat swelling  07/12/17  Taking this right now and no problem. sy/rn  . Letrozole Shortness Of Breath, Rash and Other (See Comments)    Swelling - feet, eyes,hands;   Speech garbled  . Levofloxacin Rash and Swelling    Facial swelling and red facial rash  . Penicillins Hives, Swelling and Rash    All over the body. Has patient had a PCN reaction causing immediate rash, facial/tongue/throat swelling, SOB or lightheadedness with hypotension: Yes Has patient had a PCN reaction causing severe rash involving mucus membranes or skin necrosis: No Has patient had a PCN reaction that required hospitalization No Has patient had a PCN reaction occurring within the last 10 years: No If all of the above answers are "NO", then may proceed with Cephalosporin use.   . Symbicort [Budesonide-Formoterol Fumarate] Anaphylaxis, Hives, Swelling and Rash  . Codeine Other (See Comments)    hallucinations  . Percocet [Oxycodone-Acetaminophen] Other (See Comments)  Causes syncope day after medication has been taken.  . Vicodin [Hydrocodone-Acetaminophen] Other (See Comments)    Causes syncope day after medication has been taken.  . Doxycycline Swelling  . Ezetimibe Other (See Comments)    Myalgias,fatigue  . Prednisone Other (See Comments)    Unknown  . Cefdinir Rash and Swelling    HOME MEDICATIONS: Outpatient Medications Prior to Visit  Medication Sig Dispense Refill  . ALPRAZolam (XANAX) 0.5 MG tablet Take 0.5 mg by mouth at bedtime as needed for anxiety or sleep.     Francia Greaves THYROID 90 MG tablet TK 1 T PO QAM ON AN EMPTY STOMACH AND 1/2 T BEFORE LUNCH  3  . Evolocumab (REPATHA SURECLICK) 979 MG/ML SOAJ Inject 1 Dose into the  skin every 14 (fourteen) days. 2 pen 11  . fexofenadine (ALLEGRA) 60 MG tablet Take 1 tablet (60 mg total) by mouth 2 (two) times daily.    . hydrochlorothiazide (HYDRODIURIL) 25 MG tablet Take 25 mg by mouth at bedtime.     Marland Kitchen omeprazole (PRILOSEC) 40 MG capsule TAKE 1 CAPSULE BY MOUTH DAILY 30 capsule 0  . oxybutynin (DITROPAN-XL) 10 MG 24 hr tablet   0  . divalproex (DEPAKOTE) 500 MG DR tablet TAKE 1 TABLET(500 MG) BY MOUTH DAILY 90 tablet 3  . meloxicam (MOBIC) 15 MG tablet TAKE 1 TABLET(15 MG) BY MOUTH DAILY AS NEEDED FOR PAIN 30 tablet 0   Facility-Administered Medications Prior to Visit  Medication Dose Route Frequency Provider Last Rate Last Admin  . fluticasone (FLOVENT HFA) 44 MCG/ACT inhaler 2 puff  2 puff Inhalation BID Chesley Mires, MD        PAST MEDICAL HISTORY: Past Medical History:  Diagnosis Date  . Abdominal pain   . Breast cancer ([place]) 06/2013   left upper inner  . Cancer ([place])    breast  . Diabetes mellitus without complication ([place])    Takes Tradjenta  . Family history of adverse reaction to anesthesia    Patients mother had to be resusciated after having anesthesia  . GERD (gastroesophageal reflux disease)   . Headache(784.0)    Takes Depakote  . Hernia, incisional periumbilical, incarcerated 8/92/1194  . Hot flashes   . Hx of radiation therapy 09/09/13- 10/13/13   left breast 5000 cGy in 25 sessions, no boost  . Hyperlipidemia   . Hypertension    PCP DR Nolon Rod  . Hypothyroidism   . Peripheral vascular disease ([place])    pulmonary embolis-still have some  . Post concussion syndrome    4 yrs ago- sees [facility]- Dr Leonie Man every 6 months-   has sleep study 2011 following accident but was neg per patient-  short term memory issues, dates and times  . Pulmonary embolism ([place]) 2013  . Recurrent upper respiratory infection (URI)    FLU 08/23/11-08/29/11 then URI 08/29/11- 09/08/11- S/P zpack and steroids-  improved      no cough or congestion  . Thyroid disease    . 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 Ventral hernia     PAST SURGICAL HISTORY: Past Surgical History:  Procedure Laterality Date  . APPENDECTOMY  2002   lap appy.  Dr. Hassell Done  . BREAST BIOPSY    . BREAST LUMPECTOMY Left 2015   radiation  . BREAST LUMPECTOMY WITH NEEDLE LOCALIZATION AND AXILLARY SENTINEL LYMPH NODE BX Left 08/04/2013   Procedure: BREAST LUMPECTOMY WITH NEEDLE LOCALIZATION AND AXILLARY SENTINEL LYMPH NODE Biopsy x 3;  Surgeon: Rodman Key  Donne Hazel, MD;  Location: [facility];  Service: General;  Laterality: Left;  . BREAST SURGERY    . CARPAL TUNNEL RELEASE  10/2009-12/2010   left - right  . CESAREAN SECTION  X 2  . COLONOSCOPY    . HERNIA REPAIR  09/15/11   Ventral w/mesh  . JOINT REPLACEMENT    . KNEE ARTHROPLASTY Right 12/22/2015   Procedure: COMPUTER ASSISTED TOTAL KNEE ARTHROPLASTY;  Surgeon: Marybelle Killings, MD;  Location: [facility];  Service: Orthopedics;  Laterality: Right;  . LAPAROSCOPIC CHOLECYSTECTOMY  2006   Dr. Bubba Camp  . TARSAL TUNNEL RELEASE Bilateral   . THYROIDECTOMY, PARTIAL    . VENTRAL HERNIA REPAIR  09/29/2011   Procedure: LAPAROSCOPIC VENTRAL HERNIA;  Surgeon: Adin Hector, MD;  Location: [facility];  Service: General;  Laterality: N/A;  Laparoscopic Ventral Wall Hernia Repair with Mesh    FAMILY HISTORY: Family History  Problem Relation Age of Onset  . Malignant hyperthermia Mother   . Heart attack Father   . Stroke Father   . Diabetes Other        Grandmother  . Other Other        respiratory- Grandmother  . Cancer Maternal Grandmother 65       breast  . Breast cancer Maternal Grandmother     SOCIAL HISTORY: Social History   Socioeconomic History  . Marital status: Divorced    Spouse name: Not on file  . Number of children: 2  . Years of education: Master's  . Highest education level: Not on file  Occupational History    Employer: [employer]  Tobacco Use  . Smoking status: Never Smoker  . Smokeless  tobacco: Never Used  Substance and Sexual Activity  . Alcohol use: No    Alcohol/week: 1.0 standard drinks    Types: 1 Glasses of wine per week  . Drug use: No  . Sexual activity: Yes    Comment: Menarche age 68, first live birth age 68,  [place] P2. She stopped having periods approximately 1999 and took HRT until 2005.  Other Topics Concern  . Not on file  Social History Narrative   Patient is divorced and lives alone.   Patient has two children.   Patient is a Pharmacist, hospital in [employer].   Patient has a Master's degree.   Patient drinks 3-4 cups of caffeine daily.    Patient is right handed.   Social Determinants of Health   Financial Resource Strain:   . Difficulty of Paying Living Expenses: Not on file  Food Insecurity:   . Worried About Charity fundraiser in the Last Year: Not on file  . Ran Out of Food in the Last Year: Not on file  Transportation Needs:   . Lack of Transportation (Medical): Not on file  . Lack of Transportation (Non-Medical): Not on file  Physical Activity:   . Days of Exercise per Week: Not on file  . Minutes of Exercise per Session: Not on file  Stress:   . Feeling of Stress : Not on file  Social Connections:   . Frequency of Communication with Friends and Family: Not on file  . Frequency of Social Gatherings with Friends and Family: Not on file  . Attends Religious Services: Not on file  . Active Member of Clubs or Organizations: Not on file  . Attends Archivist Meetings: Not on file  . Marital Status: Not on file  Intimate Partner Violence:   .  Fear of Current or Ex-Partner: Not on file  . Emotionally Abused: Not on file  . Physically Abused: Not on file  . Sexually Abused: Not on file     PHYSICAL EXAM  Vitals:   08/26/19 0821  BP: 132/84  Pulse: 70  Temp: (!) 97.1 F (36.2 C)  Weight: 215 lb (97.5 kg)  Height: 5' 2"  (1.575 m)   Body mass index is 39.32 kg/m.  Generalized: Well developed, obese pleasant  middle-aged female in no acute distress  Head: normocephalic and atraumatic,. Oropharynx benign  Neck: Supple,  Musculoskeletal: No deformity   Neurological examination   Mentation: Awake and alert.  Follows all commands.  Speech and language fluent MMSE - Mini Mental State Exam 08/26/2019 07/25/2018 07/12/2017  Orientation to time 5 5 5   Orientation to Place 5 5 4   Registration 3 3 3   Attention/ Calculation 4 4 4   Recall 3 3 2   Language- name 2 objects 2 2 2   Language- repeat 1 1 1   Language- follow 3 step command 3 3 3   Language- read & follow direction 1 1 1   Write a sentence 1 1 1   Copy design 1 1 1   Copy design-comments named 6 animals - -  Total score 29 29 27    Cranial Nerves: Pupils equal, briskly reactive to light. Extraocular movements full without nystagmus. Visual fields full to confrontation. Hearing intact. Facial sensation intact. Face, tongue, palate moves normally and symmetrically.  Motor: Normal bulk and tone. Normal strength in all tested extremity muscles. Sensory.: intact to touch , pinprick , position and vibratory sensation.  Coordination: Rapid alternating movements normal in all extremities. Finger-to-nose and heel-to-shin performed accurately bilaterally. Gait and Station: Arises from chair without difficulty. Stance is normal. Gait demonstrates normal stride length and balance Reflexes: 1+ and symmetric. Toes downgoing.     DIAGNOSTIC DATA (LABS, IMAGING, TESTING) - I reviewed patient records, labs, notes, testing and imaging myself where available.  Lab Results  Component Value Date   WBC 5.3 10/08/2018   HGB 14.8 10/08/2018   HCT 47.0 (H) 10/08/2018   MCV 87.5 10/08/2018   PLT 280 10/08/2018      Component Value Date/Time   NA 140 10/08/2018 1245   NA 140 07/27/2016 1450   K 3.9 10/08/2018 1245   K 3.4 (L) 07/27/2016 1450   CL 97 (L) 10/08/2018 1245   CO2 29 10/08/2018 1245   CO2 29 07/27/2016 1450   GLUCOSE 102 (H) 10/08/2018 1245    GLUCOSE 85 07/27/2016 1450   BUN 15 10/08/2018 1245   BUN 15.4 07/27/2016 1450   CREATININE 1.45 (H) 10/08/2018 1245   CREATININE 0.8 07/27/2016 1450   CALCIUM 9.9 10/08/2018 1245   CALCIUM 9.8 07/27/2016 1450   PROT 6.6 12/31/2018 1040   PROT 7.4 07/27/2016 1450   ALBUMIN 4.6 12/31/2018 1040   ALBUMIN 4.1 07/27/2016 1450   AST 31 12/31/2018 1040   AST 21 07/27/2016 1450   ALT 52 (H) 12/31/2018 1040   ALT 27 07/27/2016 1450   ALKPHOS 42 12/31/2018 1040   ALKPHOS 59 07/27/2016 1450   BILITOT 0.6 12/31/2018 1040   BILITOT 0.75 07/27/2016 1450   GFRNONAA 37 (L) 10/08/2018 1245   GFRAA 43 (L) 10/08/2018 1245   Lipid Panel     Component Value Date/Time   CHOL 151 08/11/2019 1026   TRIG 142 08/11/2019 1026   HDL 51 08/11/2019 1026   CHOLHDL 3.0 08/11/2019 1026   LDLCALC 75 08/11/2019 1026  LABVLDL 25 08/11/2019 1026        ASSESSMENT AND PLAN  69 y.o. year old female  has a past medical history of Hyperlipidemia; Post concussion syndrome with memory loss and headache and HTN.  Continues to be followed in this office for monitoring and management of memory loss and headaches.  Headaches and memory loss previously stable but has had worsening over the past 2 months with daily headaches and worsening short-term memory likely stress related   PLAN Recommend changing Depakote formulation from DR to ER 500 mg nightly.  Advised to continue for 1 week and call office with needed dosage increase.  Advised her to limit Tylenol use due to rebound headaches. Discussion regarding importance of stress reduction techniques which she is currently working on and feels as though this has been gradually improving.  Denies depression or anxiety symptoms. MMSE stable 29/30 -continue to monitor.  Also advised to ensure daily activity, maintaining a healthy diet and brain exercises  Follow-up in 6 months or call earlier if needed  Patient of Dr. Leonie Man who is out of the office today -office visit  note will be sent to work in doctor   Greater than 50% of this 20-minute visit was spent discussing worsening headaches and subjective worsening of short-term memory, discussion regarding increased stress, discussion regarding symptoms of depression/anxiety which she denies, and answered all questions to patient satisfaction  Frann Rider, AGNP-BC  [facility] [address] [place], [place] [address]  Phone [phone] Fax [phone] Note: This document was prepared with digital dictation and possible smart phrase technology. Any transcriptional errors that result from this process are unintentional.

## 2019-08-26 ENCOUNTER — Encounter: Payer: Self-pay | Admitting: Adult Health

## 2019-08-26 ENCOUNTER — Ambulatory Visit: Payer: Medicare PPO | Admitting: Adult Health

## 2019-08-26 VITALS — BP 132/84 | HR 70 | Temp 97.1°F | Ht 62.0 in | Wt 215.0 lb

## 2019-08-26 DIAGNOSIS — F0781 Postconcussional syndrome: Secondary | ICD-10-CM | POA: Diagnosis not present

## 2019-08-26 DIAGNOSIS — R519 Headache, unspecified: Secondary | ICD-10-CM

## 2019-08-26 DIAGNOSIS — R413 Other amnesia: Secondary | ICD-10-CM | POA: Diagnosis not present

## 2019-08-26 MED ORDER — DIVALPROEX SODIUM ER 500 MG PO TB24: 500.0000 mg | ORAL_TABLET | Freq: Every day | ORAL | 6 refills | Status: DC

## 2019-08-26 NOTE — Progress Notes (Signed)
I have read the note, and I agree with the clinical assessment and plan.  Izack Hoogland A. Travius Crochet, MD, PhD, FAAN Certified in Neurology, Clinical Neurophysiology, Sleep Medicine, Pain Medicine and Neuroimaging  [facility] [address], [address] [place], [place] [address] [phone]  

## 2019-08-26 NOTE — Patient Instructions (Addendum)
Your Plan:  Depakote ER 500 mg daily - May consider increasing in the future if needed - please call after 1 week if dose increase is needed  Decrease tylenol due to possible rebound headache   Follow up in 6 months or call earlier if needed        Thank you for coming to see [place] at [facility]. I hope we have been able to provide you high quality care today.  You may receive a patient satisfaction survey over the next few weeks. We would appreciate your feedback and comments so that we may continue to improve ourselves and the health of our patients.      Stress, Adult Stress is a normal reaction to life events. Stress is what you feel when life demands more than you are used to, or more than you think you can handle. Some stress can be useful, such as studying for a test or meeting a deadline at work. Stress that occurs too often or for too long can cause problems. It can affect your emotional health and interfere with relationships and normal daily activities. Too much stress can weaken your body's defense system (immune system) and increase your risk for physical illness. If you already have a medical problem, stress can make it worse. What are the causes? All sorts of life events can cause stress. An event that causes stress for one person may not be stressful for another person. Major life events, whether positive or negative, commonly cause stress. Examples include:  Losing a job or starting a new job.  Losing a loved one.  Moving to a new town or home.  Getting married or divorced.  Having a baby.  Getting injured or sick. Less obvious life events can also cause stress, especially if they occur day after day or in combination with each other. Examples include:  Working long hours.  Driving in traffic.  Caring for children.  Being in debt.  Being in a difficult relationship. What are the signs or symptoms? Stress can cause emotional symptoms,  including:  Anxiety. This is feeling worried, afraid, on edge, overwhelmed, or out of control.  Anger, including irritation or impatience.  Depression. This is feeling sad, down, helpless, or guilty.  Trouble focusing, remembering, or making decisions. Stress can cause physical symptoms, including:  Aches and pains. These may affect your head, neck, back, stomach, or other areas of your body.  Tight muscles or a clenched jaw.  Low energy.  Trouble sleeping. Stress can cause unhealthy behaviors, including:  Eating to feel better (overeating) or skipping meals.  Working too much or putting off tasks.  Smoking, drinking alcohol, or using drugs to feel better. How is this diagnosed? Stress is diagnosed through an assessment by your health care provider. He or she may diagnose this condition based on:  Your symptoms and any stressful life events.  Your medical history.  Tests to rule out other causes of your symptoms. Depending on your condition, your health care provider may refer you to a specialist for further evaluation. How is this treated?  Stress management techniques are the recommended treatment for stress. Medicine is not typically recommended for the treatment of stress. Techniques to reduce your reaction to stressful life events include:  Stress identification. Monitor yourself for symptoms of stress and identify what causes stress for you. These skills may help you to avoid or prepare for stressful events.  Time management. Set your priorities, keep a calendar of events, and  learn to say no. Taking these actions can help you avoid making too many commitments. Techniques for coping with stress include:  Rethinking the problem. Try to think realistically about stressful events rather than ignoring them or overreacting. Try to find the positives in a stressful situation rather than focusing on the negatives.  Exercise. Physical exercise can release both physical and  emotional tension. The key is to find a form of exercise that you enjoy and do it regularly.  Relaxation techniques. These relax the body and mind. The key is to find one or more that you enjoy and use the techniques regularly. Examples include: ? Meditation, deep breathing, or progressive relaxation techniques. ? Yoga or tai chi. ? Biofeedback, mindfulness techniques, or journaling. ? Listening to music, being out in nature, or participating in other hobbies.  Practicing a healthy lifestyle. Eat a balanced diet, drink plenty of water, limit or avoid caffeine, and get plenty of sleep.  Having a strong support network. Spend time with family, friends, or other people you enjoy being around. Express your feelings and talk things over with someone you trust. Counseling or talk therapy with a mental health professional may be helpful if you are having trouble managing stress on your own. Follow these instructions at home: Lifestyle   Avoid drugs.  Do not use any products that contain nicotine or tobacco, such as cigarettes, e-cigarettes, and chewing tobacco. If you need help quitting, ask your health care provider.  Limit alcohol intake to no more than 1 drink a day for nonpregnant women and 2 drinks a day for men. One drink equals 12 oz of beer, 5 oz of wine, or 1 oz of hard liquor  Do not use alcohol or drugs to relax.  Eat a balanced diet that includes fresh fruits and vegetables, whole grains, lean meats, fish, eggs, and beans, and low-fat dairy. Avoid processed foods and foods high in added fat, sugar, and salt.  Exercise at least 30 minutes on 5 or more days each week.  Get 7-8 hours of sleep each night. General instructions   Practice stress management techniques as discussed with your health care provider.  Drink enough fluid to keep your urine clear or pale yellow.  Take over-the-counter and prescription medicines only as told by your health care provider.  Keep all  follow-up visits as told by your health care provider. This is important. Contact a health care provider if:  Your symptoms get worse.  You have new symptoms.  You feel overwhelmed by your problems and can no longer manage them on your own. Get help right away if:  You have thoughts of hurting yourself or others. If you ever feel like you may hurt yourself or others, or have thoughts about taking your own life, get help right away. You can go to your nearest emergency department or call:  Your local emergency services (911 in the [place]  A suicide crisis helpline, such as the [place] at (667)703-5194. This is open 24 hours a day. Summary  Stress is a normal reaction to life events. It can cause problems if it happens too often or for too long.  Practicing stress management techniques is the best way to treat stress.  Counseling or talk therapy with a mental health professional may be helpful if you are having trouble managing stress on your own. This information is not intended to replace advice given to you by your health care provider. Make sure you discuss any questions  you have with your health care provider. Document Revised: 02/07/2019 Document Reviewed: 08/30/2016 Elsevier Patient Education  [place].

## 2019-09-02 ENCOUNTER — Telehealth: Payer: Self-pay | Admitting: Adult Health

## 2019-09-02 NOTE — Telephone Encounter (Signed)
A new medication was not started but did change formulation from delayed release to extended release.  We can trial increasing Depakote dosage but also as discussed she needs to refrain from use of Tylenol due to rebound headaches.  If her headache characteristics are different or worse pain of her life headache, she needs to proceed to ED for further evaluation

## 2019-09-02 NOTE — Telephone Encounter (Signed)
What did you want to do?

## 2019-09-02 NOTE — Telephone Encounter (Signed)
Spoke to pt and she is having continuous headache.  No relief from depakote 536m ER.  Taking 10045mES tylenol daily vs all the time now.  Lights make worse.  She is using blue blocker glasses, dimming lights.  Working from home today.   Pain is all over head.  Please advise.

## 2019-09-02 NOTE — Telephone Encounter (Signed)
I would recommend increasing dosage if patient agrees.  If she is willing to try increasing dosage of extended release for doing 500 mg twice daily we can try that.  Please let me know what she wishes to do an order will be placed.

## 2019-09-02 NOTE — Telephone Encounter (Signed)
Pt states she was told by Janett Billow, NP to call if her headaches did not get any better.  Pt states she is actually home from work because her headaches have gotten so bad even with the new medication, please call.

## 2019-09-03 MED ORDER — DIVALPROEX SODIUM ER 500 MG PO TB24: 500.0000 mg | ORAL_TABLET | Freq: Two times a day (BID) | ORAL | 3 refills | Status: DC

## 2019-09-03 NOTE — Addendum Note (Signed)
Addended by: Mal Misty on: 09/03/2019 09:45 AM   Modules accepted: Orders

## 2019-09-03 NOTE — Addendum Note (Signed)
Addended by: Brandon Melnick on: 09/03/2019 08:48 AM   Modules accepted: Orders

## 2019-09-03 NOTE — Telephone Encounter (Signed)
I spoke to pt and she is better this am.  She is willing to increase to depakote ER 526m po bid.  [facility] [place].  She will call [place] as needed.  Ddi remind about rebound and tylenol usage (even if taking once daily).  Also if worsening headache, different signs, worst H/A ever to proceed to ED for eval.  She verbalized understanding. Order placed, for you sign off.

## 2019-09-09 ENCOUNTER — Ambulatory Visit
Admission: RE | Admit: 2019-09-09 | Discharge: 2019-09-09 | Disposition: A | Discharge status: Home / Self Care | Payer: Medicare PPO | Source: Ambulatory Visit | Attending: Oncology | Admitting: Oncology

## 2019-09-09 ENCOUNTER — Other Ambulatory Visit: Payer: Self-pay | Admitting: Oncology

## 2019-09-09 ENCOUNTER — Other Ambulatory Visit: Payer: Self-pay

## 2019-09-09 DIAGNOSIS — Z853 Personal history of malignant neoplasm of breast: Secondary | ICD-10-CM

## 2019-09-09 DIAGNOSIS — Z9889 Other specified postprocedural states: Secondary | ICD-10-CM

## 2019-09-18 ENCOUNTER — Telehealth: Payer: Self-pay | Admitting: Internal Medicine

## 2019-09-18 NOTE — Telephone Encounter (Signed)
PA for Repatha [employer] submitted via [facility] PA Case: [id number], Status: Approved, Coverage Starts on: 07/25/2019 12:00:00 AM, Coverage Ends on: 07/23/2020 12:00:00 AM.

## 2019-09-22 ENCOUNTER — Ambulatory Visit: Payer: Medicare PPO | Admitting: Internal Medicine

## 2019-10-17 ENCOUNTER — Other Ambulatory Visit: Payer: Self-pay

## 2019-10-17 ENCOUNTER — Encounter: Payer: Self-pay | Admitting: Internal Medicine

## 2019-10-17 ENCOUNTER — Ambulatory Visit: Payer: Medicare PPO | Admitting: Internal Medicine

## 2019-10-17 VITALS — BP 129/75 | HR 71 | Temp 97.0°F | Ht 62.0 in | Wt 217.6 lb

## 2019-10-17 DIAGNOSIS — K760 Fatty (change of) liver, not elsewhere classified: Secondary | ICD-10-CM | POA: Diagnosis not present

## 2019-10-17 DIAGNOSIS — E7849 Other hyperlipidemia: Secondary | ICD-10-CM | POA: Diagnosis not present

## 2019-10-17 DIAGNOSIS — I2510 Atherosclerotic heart disease of native coronary artery without angina pectoris: Secondary | ICD-10-CM

## 2019-10-17 DIAGNOSIS — I2584 Coronary atherosclerosis due to calcified coronary lesion: Secondary | ICD-10-CM | POA: Diagnosis not present

## 2019-10-17 NOTE — Patient Instructions (Signed)
Medication Instructions:  Your physician recommends that you continue on your current medications as directed. Please refer to the Current Medication list given to you today.  *If you need a refill on your cardiac medications before your next appointment, please call your pharmacy*   Lab Work: FASTING lab work in 1 year (before next appointment)  If you have labs (blood work) drawn today and your tests are completely normal, you will receive your results only by: Marland Kitchen MyChart Message (if you have MyChart) OR . A paper copy in the mail  Follow-Up: At [facility], you and your health needs are our priority.  As part of our continuing mission to provide you with exceptional heart care, we have created designated Provider Care Teams.  These Care Teams include your primary Cardiologist (physician) and Advanced Practice Providers (APPs -  Physician Assistants and Nurse Practitioners) who all work together to provide you with the care you need, when you need it.  We recommend signing up for the patient portal called "MyChart".  Sign up information is provided on this After Visit Summary.  MyChart is used to connect with patients for Virtual Visits (Telemedicine).  Patients are able to view lab/test results, encounter notes, upcoming appointments, etc.  Non-urgent messages can be sent to your provider as well.   To learn more about what you can do with MyChart, go to [email or web address].    Your next appointment:   12 month(s)  The format for your next appointment:   Either In Person or Virtual  Provider:   Raliegh Ip Mali Hilty, MD   Other Instructions

## 2019-10-17 NOTE — Progress Notes (Signed)
LIPID CLINIC CONSULT NOTE  Chief Complaint:  Follow-up dyslipidemia  Primary Care Physician: Nickola Major, MD  HPI:  AMYBETH SIEG is a 69 y.o. female who is being seen today for the evaluation of dyslipidemia at the request of Schoenhoff, Altamese Cabal, *.  This is a very pleasant 69 year old female with a significant history of dyslipidemia.  She first noted she had elevated cholesterol in her 3s.  She has a strong family history of elevated cholesterol and heart disease.  Both of her brothers are age 68 and 99 and have elevated cholesterols over 400.  Her mom also had elevated cholesterol and her father had heart disease and died at age 87.  Unfortunately she has been intolerant to statins and is been on numerous statins in the past including Crestor, Lipitor, lovastatin and ezetimibe.  She also has nonalcoholic fatty liver disease.  Most recently her total cholesterol was 343, triglycerides 154, HDL of 40 and LDL of 272.  This is after losing 40 pounds recently although she is still moderately obese on the ketogenic diet.  She is also been taking Crestor but had significant pain with it and then finally discontinued it.  She has no known coronary disease although did have a pulmonary embolus in 2013.  I personally reviewed her CT scan which does show multivessel coronary artery calcification.  Is a history of breast cancer, diabetes which has resolved with weight loss, and postconcussive syndrome.  She reports that she had had a fairly atherogenic diet however recently again switched ketogenic diet and is cut out a lot of "bad saturated fats".  03/13/2019  Ms. Friis returns today for follow-up.  Recently had repeat lipids which showed marked improvement in numbers.  Total cholesterol is now 168 with triglycerides 132, HDL 52 and LDL of 90.  He is tolerating Praluent without any side effects.  Overall she is very pleased with her improved cholesterol numbers.  10/17/2019  Ms. Kimble  returns today for follow-up.  Overall she is doing well.  Her repeat lipid recently showed total cholesterol 151, triglycerides 142, HDL 51 and LDL 75.  This is slightly improved and she has subsequently switched from Praluent to [place] due to a change in her primary insurance.  Overall she is pleased with the medication.  PMHx:  Past Medical History:  Diagnosis Date  . Abdominal pain   . Breast cancer ([place]) 06/2013   left upper inner  . Cancer ([place])    breast  . Diabetes mellitus without complication ([place])    Takes Tradjenta  . Family history of adverse reaction to anesthesia    Patients mother had to be resusciated after having anesthesia  . GERD (gastroesophageal reflux disease)   . Headache(784.0)    Takes Depakote  . Hernia, incisional periumbilical, incarcerated 7/82/9562  . Hot flashes   . Hx of radiation therapy 09/09/13- 10/13/13   left breast 5000 cGy in 25 sessions, no boost  . Hyperlipidemia   . Hypertension    PCP DR Nolon Rod  . Hypothyroidism   . Peripheral vascular disease ([place])    pulmonary embolis-still have some  . Post concussion syndrome    4 yrs ago- sees [facility]- Dr Leonie Man every 6 months-   has sleep study 2011 following accident but was neg per patient-  short term memory issues, dates and times  . Pulmonary embolism ([place]) 2013  . Recurrent upper respiratory infection (URI)    FLU 08/23/11-08/29/11 then URI 08/29/11- 09/08/11- S/P  zpack and steroids-  improved      no cough or congestion  . Thyroid disease   . 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 Ventral hernia     Past Surgical History:  Procedure Laterality Date  . APPENDECTOMY  2002   lap appy.  Dr. Hassell Done  . BREAST BIOPSY    . BREAST LUMPECTOMY Left 2015   radiation  . BREAST LUMPECTOMY WITH NEEDLE LOCALIZATION AND AXILLARY SENTINEL LYMPH NODE BX Left 08/04/2013   Procedure: BREAST LUMPECTOMY WITH NEEDLE LOCALIZATION AND AXILLARY SENTINEL LYMPH NODE Biopsy x 3;  Surgeon:  Rolm Bookbinder, MD;  Location: [facility];  Service: General;  Laterality: Left;  . BREAST SURGERY    . CARPAL TUNNEL RELEASE  10/2009-12/2010   left - right  . CESAREAN SECTION  X 2  . COLONOSCOPY    . HERNIA REPAIR  09/15/11   Ventral w/mesh  . JOINT REPLACEMENT    . KNEE ARTHROPLASTY Right 12/22/2015   Procedure: COMPUTER ASSISTED TOTAL KNEE ARTHROPLASTY;  Surgeon: Marybelle Killings, MD;  Location: [facility];  Service: Orthopedics;  Laterality: Right;  . LAPAROSCOPIC CHOLECYSTECTOMY  2006   Dr. Bubba Camp  . TARSAL TUNNEL RELEASE Bilateral   . THYROIDECTOMY, PARTIAL    . VENTRAL HERNIA REPAIR  09/29/2011   Procedure: LAPAROSCOPIC VENTRAL HERNIA;  Surgeon: Adin Hector, MD;  Location: [facility];  Service: General;  Laterality: N/A;  Laparoscopic Ventral Wall Hernia Repair with Mesh    FAMHx:  Family History  Problem Relation Age of Onset  . Malignant hyperthermia Mother   . Heart attack Father   . Stroke Father   . Diabetes Other        Grandmother  . Other Other        respiratory- Grandmother  . Cancer Maternal Grandmother 47       breast  . Breast cancer Maternal Grandmother     SOCHx:   reports that she has never smoked. She has never used smokeless tobacco. She reports that she does not drink alcohol or use drugs.  ALLERGIES:  Allergies  Allergen Reactions  . Aleve [Naproxen Sodium] Hives, Itching and Swelling  . Aromasin [Exemestane] Anaphylaxis, Itching and Swelling    Pt currently tolerating medication well (02/03/16)-gwd; Pt stated "I can take this medication for a few months at a time and I take a benadryl when I feel it starting to come on" Patient has facial swelling, itching, and throat swelling  07/12/17  Taking this right now and no problem. sy/rn  . Letrozole Shortness Of Breath, Rash and Other (See Comments)    Swelling - feet, eyes,hands;   Speech garbled  . Levofloxacin Rash and Swelling    Facial swelling and red facial rash  . Penicillins Hives, Swelling and Rash     All over the body. Has patient had a PCN reaction causing immediate rash, facial/tongue/throat swelling, SOB or lightheadedness with hypotension: Yes Has patient had a PCN reaction causing severe rash involving mucus membranes or skin necrosis: No Has patient had a PCN reaction that required hospitalization No Has patient had a PCN reaction occurring within the last 10 years: No If all of the above answers are "NO", then may proceed with Cephalosporin use.   . Symbicort [Budesonide-Formoterol Fumarate] Anaphylaxis, Hives, Swelling and Rash  . Codeine Other (See Comments)    hallucinations  . Percocet [Oxycodone-Acetaminophen] Other (See Comments)    Causes syncope day after medication has been taken.  Marland Kitchen  Vicodin [Hydrocodone-Acetaminophen] Other (See Comments)    Causes syncope day after medication has been taken.  . Doxycycline Swelling  . Ezetimibe Other (See Comments)    Myalgias,fatigue  . Prednisone Other (See Comments)    Unknown  . Cefdinir Rash and Swelling    ROS: Pertinent items noted in HPI and remainder of comprehensive ROS otherwise negative.  HOME MEDS: Current Outpatient Medications on File Prior to Visit  Medication Sig Dispense Refill  . ALPRAZolam (XANAX) 0.5 MG tablet Take 0.5 mg by mouth at bedtime as needed for anxiety or sleep.     Francia Greaves THYROID 90 MG tablet TK 1 T PO QAM ON AN EMPTY STOMACH AND 1/2 T BEFORE LUNCH  3  . divalproex (DEPAKOTE ER) 500 MG 24 hr tablet Take 1 tablet (500 mg total) by mouth 2 (two) times daily. 60 tablet 3  . Evolocumab (REPATHA SURECLICK) 213 MG/ML SOAJ Inject 1 Dose into the skin every 14 (fourteen) days. 2 pen 11  . fexofenadine (ALLEGRA) 60 MG tablet Take 1 tablet (60 mg total) by mouth 2 (two) times daily.    . hydrochlorothiazide (HYDRODIURIL) 25 MG tablet Take 25 mg by mouth at bedtime.     Marland Kitchen omeprazole (PRILOSEC) 40 MG capsule TAKE 1 CAPSULE BY MOUTH DAILY 30 capsule 0  . oxybutynin (DITROPAN-XL) 10 MG 24 hr tablet   0    Current Facility-Administered Medications on File Prior to Visit  Medication Dose Route Frequency Provider Last Rate Last Admin  . fluticasone (FLOVENT HFA) 44 MCG/ACT inhaler 2 puff  2 puff Inhalation BID Chesley Mires, MD        LABS/IMAGING: No results found for this or any previous visit (from the past 48 hour(s)). No results found.  LIPID PANEL:    Component Value Date/Time   CHOL 151 08/11/2019 1026   TRIG 142 08/11/2019 1026   HDL 51 08/11/2019 1026   CHOLHDL 3.0 08/11/2019 1026   LDLCALC 75 08/11/2019 1026    WEIGHTS: Wt Readings from Last 3 Encounters:  10/17/19 217 lb 9.6 oz (98.7 kg)  08/26/19 215 lb (97.5 kg)  03/13/19 214 lb 9.6 oz (97.3 kg)    VITALS: BP 129/75   Pulse 71   Temp (!) 97 F (36.1 C)   Ht 5' 2"  (1.575 m)   Wt 217 lb 9.6 oz (98.7 kg)   SpO2 96%   BMI 39.80 kg/m   EXAM: Deferred  EKG: Deferred  ASSESSMENT: 1. 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 Family history of premature coronary disease 3. Statin intolerance 4. Multivessel coronary calcification  PLAN: 1.   Mrs. Birenbaum continues to have significant improvement in her lipids and is now on Repatha but is tolerating it well.  We will continue the medication indefinitely.  She denies any anginal symptoms.  Blood pressures well controlled today.  Plan follow-up with me annually or sooner as necessary.  Pixie Casino, MD, [facility], [place] Director of the Advanced Lipid Disorders &  Cardiovascular Risk Reduction Clinic Diplomate of the American Board of Clinical Lipidology Attending Cardiologist  Direct Dial: [phone]  Fax: [phone]  Website:  [email or web address] 10/17/2019, 4:36 PM

## 2019-11-11 ENCOUNTER — Ambulatory Visit: Payer: Medicare PPO | Admitting: Adult Health

## 2019-11-19 ENCOUNTER — Encounter: Payer: Self-pay | Admitting: Orthopaedic Surgery

## 2019-11-19 ENCOUNTER — Ambulatory Visit: Payer: Medicare PPO | Admitting: Orthopaedic Surgery

## 2019-11-19 ENCOUNTER — Other Ambulatory Visit: Payer: Self-pay

## 2019-11-19 ENCOUNTER — Ambulatory Visit: Payer: Self-pay

## 2019-11-19 VITALS — BP 143/88 | HR 59 | Ht 62.0 in | Wt 204.0 lb

## 2019-11-19 DIAGNOSIS — M542 Cervicalgia: Secondary | ICD-10-CM | POA: Diagnosis not present

## 2019-11-19 DIAGNOSIS — M4722 Other spondylosis with radiculopathy, cervical region: Secondary | ICD-10-CM

## 2019-11-19 NOTE — Progress Notes (Signed)
Office Visit Note   Patient: Alyssa [name]           Date of Birth: 30-Jan-1951           MRN: [id number] Visit Date: 11/19/2019              Requested by: Nickola Major, MD [address] [place],   [id number] PCP: Nickola Major, MD   Assessment & Plan: Visit Diagnoses:  1. Neck pain   2. Other spondylosis with radiculopathy, cervical region     Plan: Patient states she is ready to proceed with surgery at the end of school year this year.  Last MRI was 2019 and she has undoubtedly had some progression of her spondylosis , she already had severe foraminal stenosis at C5-6 on the right and had retrolisthesis at that level.  Disc bulge and moderate stenosis at C67 foraminal.  Would recommend new MRI scan and then we can proceed with operative intervention.  Pathophysiology discussed we looked at old MRI scan current updated cervical spine images.  Discussed postoperative care, use of a collar overnight stay in the hospital.  Office follow-up after MRI.  Follow-Up Instructions: Follow-up after cervical MRI scan.  Orders:  Orders Placed This Encounter  Procedures  . XR Cervical Spine 2 or 3 views   No orders of the defined types were placed in this encounter.     Procedures: No procedures performed   Clinical Data: No additional findings.   Subjective: Chief Complaint  Patient presents with  . 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  Previous MRI 2 years ago showed spondylosis C5-6 C6-7 with C5-6 retrolisthesis and severe and moderate foraminal stenosis.  Patient's symptoms have progressed she has been doing video teaching with increased symptoms and now states she is ready to proceed with surgical intervention.  Patient's had some problems with her lumbar spine as well where she has anterolisthesis degenerative in nature at L4-5 and an episode in 2020 with a rolling chair slid out from under her.  Previous  carpal tunnel releases.  She is noticed weakness in her hand weakness with gripping and pain that radiates from her neck down to her radial side of her right hand more than left hand.  No myelopathic changes.  Review of Systems past history is remarkable history of pulmonary embolism, GERD.  No current respiratory symptoms.  History of patellar tendinitis right knee.  And left breast cancer otherwise negative as pertains HPI.   Objective: Vital Signs: BP (!) 143/88   Pulse (!) 59   Ht 5' 2"  (1.575 m)   Wt 204 lb (92.5 kg)   BMI 37.31 kg/m   Physical Exam Constitutional:      Appearance: She is well-developed.  HENT:     Head: Normocephalic.     Right Ear: External ear normal.     Left Ear: External ear normal.  Eyes:     Pupils: Pupils are equal, round, and reactive to light.  Neck:     Thyroid: No thyromegaly.     Trachea: No tracheal deviation.  Cardiovascular:     Rate and Rhythm: Normal rate.  Pulmonary:     Effort: Pulmonary effort is normal.  Abdominal:     Palpations: Abdomen is soft.  Skin:    General: Skin is warm and dry.  Neurological:     Mental Status: She is alert and oriented to person, place, and time.  Psychiatric:        Behavior: Behavior normal.     Ortho Exam patient has brachial plexus tenderness more on the right and left well-healed carpal tunnel incisions.  Wrist flexion extension is strong.  Upper extremity reflexes are 1+ and symmetrical.  Specialty Comments:  No specialty comments available.  Imaging: XR Cervical Spine 2 or 3 views  Result Date: 11/19/2019 AP lateral cervical spine x-rays images were obtained and reviewed.  This shows spondylosis with disc space narrowing and prominent anterior posterior spurring at C5-6 more than C6-7.  Negative for acute changes. Impression: Cervical spondylosis primarily C5-6 C6-7.    PMFS History: Patient Active Problem List   Diagnosis Date Noted  . Other spondylosis with radiculopathy, cervical  region 06/13/2018  . GERD (gastroesophageal reflux disease) 04/05/2018  . Allergic rhinitis 04/05/2018  . Upper airway cough syndrome 04/05/2018  . Patellar tendinitis, right knee 03/01/2017  . Status post total right knee replacement 12/22/2015  . Hepatic steatosis 02/09/2015  . Hot flashes 09/09/2014  . Osteopenia 09/09/2014  . Malignant neoplasm of upper-inner quadrant of left breast in female, estrogen receptor positive ([place]) 07/23/2013  . Memory loss 07/14/2013  . Headache 07/14/2013  . Other malaise and fatigue 07/14/2013  . Acute pulmonary embolism ([place]) 10/07/2011  . Leukocytosis 10/07/2011  . 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 Hyponatremia 10/07/2011  . Obesity (BMI 30-39.9) 08/07/2011  . Post concussion syndrome    Past Medical History:  Diagnosis Date  . Abdominal pain   . Breast cancer ([place]) 06/2013   left upper inner  . Cancer ([place])    breast  . Diabetes mellitus without complication ([place])    Takes Tradjenta  . Family history of adverse reaction to anesthesia    Patients mother had to be resusciated after having anesthesia  . GERD (gastroesophageal reflux disease)   . Headache(784.0)    Takes Depakote  . Hernia, incisional periumbilical, incarcerated 08/11/1476  . Hot flashes   . Hx of radiation therapy 09/09/13- 10/13/13   left breast 5000 cGy in 25 sessions, no boost  . Hyperlipidemia   . Hypertension    PCP DR Nolon Rod  . Hypothyroidism   . Peripheral vascular disease ([place])    pulmonary embolis-still have some  . Post concussion syndrome    4 yrs ago- sees [facility]- Dr Leonie Man every 6 months-   has sleep study 2011 following accident but was neg per patient-  short term memory issues, dates and times  . Pulmonary embolism ([place]) 2013  . Recurrent upper respiratory infection (URI)    FLU 08/23/11-08/29/11 then URI 08/29/11- 09/08/11- S/P zpack and steroids-  improved      no cough or congestion  . Thyroid disease   . Thyroid mass,  left inferior lobe, 3.6cm [id number] 10/07/2011  . Ventral hernia     Family History  Problem Relation Age of Onset  . Malignant hyperthermia Mother   . Heart attack Father   . Stroke Father   . Diabetes Other        Grandmother  . Other Other        respiratory- Grandmother  . Cancer Maternal Grandmother 73       breast  . Breast cancer Maternal Grandmother     Past Surgical History:  Procedure Laterality Date  . APPENDECTOMY  2002   lap appy.  Dr. Hassell Done  . BREAST BIOPSY    . BREAST LUMPECTOMY Left 2015   radiation  .  BREAST LUMPECTOMY WITH NEEDLE LOCALIZATION AND AXILLARY SENTINEL LYMPH NODE BX Left 08/04/2013   Procedure: BREAST LUMPECTOMY WITH NEEDLE LOCALIZATION AND AXILLARY SENTINEL LYMPH NODE Biopsy x 3;  Surgeon: Rolm Bookbinder, MD;  Location: [place];  Service: General;  Laterality: Left;  . BREAST SURGERY    . CARPAL TUNNEL RELEASE  10/2009-12/2010   left - right  . CESAREAN SECTION  X 2  . COLONOSCOPY    . HERNIA REPAIR  09/15/11   Ventral w/mesh  . JOINT REPLACEMENT    . KNEE ARTHROPLASTY Right 12/22/2015   Procedure: COMPUTER ASSISTED TOTAL KNEE ARTHROPLASTY;  Surgeon: Marybelle Killings, MD;  Location: [facility];  Service: Orthopedics;  Laterality: Right;  . LAPAROSCOPIC CHOLECYSTECTOMY  2006   Dr. Bubba Camp  . TARSAL TUNNEL RELEASE Bilateral   . THYROIDECTOMY, PARTIAL    . VENTRAL HERNIA REPAIR  09/29/2011   Procedure: LAPAROSCOPIC VENTRAL HERNIA;  Surgeon: Adin Hector, MD;  Location: [facility];  Service: General;  Laterality: N/A;  Laparoscopic Ventral Wall Hernia Repair with Mesh   Social History   Occupational History    Employer: [employer]  Tobacco Use  . Smoking status: Never Smoker  . Smokeless tobacco: Never Used  Substance and Sexual Activity  . Alcohol use: No    Alcohol/week: 1.0 standard drinks    Types: 1 Glasses of wine per week  . Drug use: No  . Sexual activity: Yes    Comment: Menarche age 24, first live birth age 58,  [place] P2. She stopped  having periods approximately 1999 and took HRT until 2005.

## 2019-11-19 NOTE — Addendum Note (Signed)
Addended by: Meyer Cory on: 11/19/2019 10:08 AM   Modules accepted: Orders

## 2019-12-11 ENCOUNTER — Encounter: Payer: Self-pay | Admitting: Adult Health

## 2019-12-23 ENCOUNTER — Other Ambulatory Visit: Payer: Self-pay

## 2019-12-23 ENCOUNTER — Ambulatory Visit
Admission: RE | Admit: 2019-12-23 | Discharge: 2019-12-23 | Disposition: A | Discharge status: Home / Self Care | Payer: Medicare PPO | Source: Ambulatory Visit | Attending: Orthopaedic Surgery | Admitting: Orthopaedic Surgery

## 2019-12-23 DIAGNOSIS — M4722 Other spondylosis with radiculopathy, cervical region: Secondary | ICD-10-CM

## 2019-12-23 DIAGNOSIS — M542 Cervicalgia: Secondary | ICD-10-CM

## 2019-12-30 ENCOUNTER — Ambulatory Visit: Payer: Medicare PPO | Admitting: Orthopaedic Surgery

## 2019-12-30 ENCOUNTER — Encounter: Payer: Self-pay | Admitting: Orthopaedic Surgery

## 2019-12-30 VITALS — BP 142/81 | HR 75 | Ht 62.0 in | Wt 204.0 lb

## 2019-12-30 DIAGNOSIS — M4722 Other spondylosis with radiculopathy, cervical region: Secondary | ICD-10-CM | POA: Diagnosis not present

## 2019-12-30 NOTE — Progress Notes (Signed)
Office Visit Note   Patient: Alyssa [name]           Date of Birth: 1950/08/27           MRN: [id number] Visit Date: 12/30/2019              Requested by: Nickola Major, MD [address] [place],  [place] [id number] PCP: Nickola Major, MD   Assessment & Plan: Visit Diagnoses:  1. Other spondylosis with radiculopathy, cervical region     Plan: Patient been through conservative treatment greater than 6 months progressive symptoms with cervical spondylosis present foraminal stenosis at 1 to 2 mm retrolisthesis at C5-6 with prominent posterior vertebral body spurring. Patient had previous CT scan which showed some coronary artery calcification. Negative for chest pain or MI. We discussed two-level cervical fusion C5-6 C6-7 with allograft and plate postop soft collar x6 weeks. Questions were elicited and answered she understands request to proceed. We will seek cardiac clearance with her coronary calcification.  Follow-Up Instructions: No follow-ups on file.   Orders:  No orders of the defined types were placed in this encounter.  No orders of the defined types were placed in this encounter.     Procedures: No procedures performed   Clinical Data: No additional findings.   Subjective: Chief Complaint  Patient presents with  . Neck - Pain, Follow-up    MRI Cervical Spine Review    HPI 70 year old female returns with ongoing problems with neck pain and radicular pain in her right arm. She does not pick up things such as coffee cups or other objects is concerned she will drop some and has dropped multiple objects. She has had previous carpal tunnel releases which did well. Hands do not bother her at night. MRI scan has been obtained and reviewed and she has some spondylosis with foraminal stenosis C5-6 C6-7.  Review of Systems Review of systems negative for cardiac or pulmonary problems. Patient did have a history of pulmonary embolism years ago after she had hernia  surgery. Previous history of left breast cancer estrogen receptor positive. Bilateral total knee arthroplasties doing well. Otherwise noncontributory as it pertains HPI.   Objective: Vital Signs: BP (!) 142/81   Pulse 75   Ht 5' 2"  (1.575 m)   Wt 204 lb (92.5 kg)   BMI 37.31 kg/m   Physical Exam Constitutional:      Appearance: She is well-developed.  HENT:     Head: Normocephalic.     Right Ear: External ear normal.     Left Ear: External ear normal.  Eyes:     Pupils: Pupils are equal, round, and reactive to light.  Neck:     Thyroid: No thyromegaly.     Trachea: No tracheal deviation.  Cardiovascular:     Rate and Rhythm: Normal rate.  Pulmonary:     Effort: Pulmonary effort is normal.  Abdominal:     Palpations: Abdomen is soft.  Skin:    General: Skin is warm and dry.  Neurological:     Mental Status: She is alert and oriented to person, place, and time.  Psychiatric:        Behavior: Behavior normal.     Ortho Exam patient has brachial plexus tenderness on the right more than left. Positive Spurling on the right negative on the left. Well-healed carpal tunnel incisions no thenar atrophy. Reflexes are 1+ and symmetrical. Good wrist extension and flexion. Biceps triceps is strong. Increased pain with  cervical compression slight improvement with distraction. Patient has well-healed carpal tunnel incisions. Well-healed anterior knee incisions knee and ankle jerk are 1+ and symmetrical no clonus.  Specialty Comments:  No specialty comments available.  Imaging: Study Result  CLINICAL DATA:  Neck pain.  Right upper extremity radiculopathy.  EXAM: MRI CERVICAL SPINE WITHOUT CONTRAST  TECHNIQUE: Multiplanar, multisequence MR imaging of the cervical spine was performed. No intravenous contrast was administered.  COMPARISON:  MRI the cervical spine 06/30/2018.  FINDINGS: Alignment: Slight retrolisthesis is present at C5-6. This is stable. No new or  progressive listhesis is present.  Vertebrae: Endplate degenerative changes at C5-6 and C6-7 are again noted, similar the prior exam.  Cord: Normal signal and morphology.  Posterior Fossa, vertebral arteries, paraspinal tissues: Insert normal craniocervical  Disc levels:  C2-3: Moderate left-sided facet hypertrophy is again noted. Mild edema is present. Mild left foraminal stenosis is stable.  C3-4: Moderate facet hypertrophy is again noted. Mild left foraminal stenosis is stable. The central canal and right foramen are patent.  C4-5: A broad-based disc osteophyte complex is present. Partial effacement of ventral CSF is noted. Left-sided soft disc component is improved. Left foraminal narrowing is markedly improved. Mild right foraminal narrowing persists.  C5-6: A broad-based disc osteophyte complex is present. Ventral CSF is effaced. Moderate foraminal narrowing is stable right greater than left. There is no significant interval change.  C6-7: A broad-based disc protrusion is present. Central soft tissue component is noted. Moderate left and mild right foraminal narrowing is present.  C7-T1: Mild leftward disc protrusion is similar the prior study. No significant stenosis is present.  IMPRESSION: 1. Multilevel spondylosis of the cervical spine is similar to the prior study. 2. Mild left foraminal narrowing at C2-3 and C3-4 is stable. 3. Moderate to severe left-sided facet hypertrophy at C2-3 and C3-4 is also stable. There may be some edema at the C2-3 level. 4. Left-sided soft disc component at C4-5 is markedly improved. 5. Mild right foraminal narrowing at C4-5. 6. Moderate foraminal narrowing bilaterally at C5-6 is stable. 7. Moderate left and mild right foraminal stenosis at C6-7. 8. Mild leftward disc protrusion at C7-T1 without significant stenosis.   Electronically Signed   By: San Morelle M.D.   On: 12/23/2019 10:21       PMFS  History: Patient Active Problem List   Diagnosis Date Noted  . Other spondylosis with radiculopathy, cervical region 06/13/2018  . GERD (gastroesophageal reflux disease) 04/05/2018  . Allergic rhinitis 04/05/2018  . Upper airway cough syndrome 04/05/2018  . Patellar tendinitis, right knee 03/01/2017  . Status post total right knee replacement 12/22/2015  . Hepatic steatosis 02/09/2015  . Hot flashes 09/09/2014  . Osteopenia 09/09/2014  . Malignant neoplasm of upper-inner quadrant of left breast in female, estrogen receptor positive ([place]) 07/23/2013  . Memory loss 07/14/2013  . Headache 07/14/2013  . Other malaise and fatigue 07/14/2013  . Acute pulmonary embolism ([place]) 10/07/2011  . Leukocytosis 10/07/2011  . Thyroid mass, left inferior lobe, 3.6cm [id number] 10/07/2011  . Hyponatremia 10/07/2011  . Obesity (BMI 30-39.9) 08/07/2011  . Post concussion syndrome    Past Medical History:  Diagnosis Date  . Abdominal pain   . Breast cancer ([place]) 06/2013   left upper inner  . Cancer ([place])    breast  . Diabetes mellitus without complication ([place])    Takes Tradjenta  . Family history of adverse reaction to anesthesia    Patients mother had to be resusciated after having anesthesia  .  GERD (gastroesophageal reflux disease)   . Headache(784.0)    Takes Depakote  . Hernia, incisional periumbilical, incarcerated 5/39/7673  . Hot flashes   . Hx of radiation therapy 09/09/13- 10/13/13   left breast 5000 cGy in 25 sessions, no boost  . Hyperlipidemia   . Hypertension    PCP DR Nolon Rod  . Hypothyroidism   . Peripheral vascular disease ([place])    pulmonary embolis-still have some  . Post concussion syndrome    4 yrs ago- sees [facility]- Dr Leonie Man every 6 months-   has sleep study 2011 following accident but was neg per patient-  short term memory issues, dates and times  . Pulmonary embolism ([place]) 2013  . Recurrent upper respiratory infection (URI)    FLU 08/23/11-08/29/11 then URI  08/29/11- 09/08/11- S/P zpack and steroids-  improved      no cough or congestion  . Thyroid disease   . Thyroid mass, left inferior lobe, 3.6cm [id number] 10/07/2011  . Ventral hernia     Family History  Problem Relation Age of Onset  . Malignant hyperthermia Mother   . Heart attack Father   . Stroke Father   . Diabetes Other        Grandmother  . Other Other        respiratory- Grandmother  . Cancer Maternal Grandmother 50       breast  . Breast cancer Maternal Grandmother     Past Surgical History:  Procedure Laterality Date  . APPENDECTOMY  2002   lap appy.  Dr. Hassell Done  . BREAST BIOPSY    . BREAST LUMPECTOMY Left 2015   radiation  . BREAST LUMPECTOMY WITH NEEDLE LOCALIZATION AND AXILLARY SENTINEL LYMPH NODE BX Left 08/04/2013   Procedure: BREAST LUMPECTOMY WITH NEEDLE LOCALIZATION AND AXILLARY SENTINEL LYMPH NODE Biopsy x 3;  Surgeon: Rolm Bookbinder, MD;  Location: [facility];  Service: General;  Laterality: Left;  . BREAST SURGERY    . CARPAL TUNNEL RELEASE  10/2009-12/2010   left - right  . CESAREAN SECTION  X 2  . COLONOSCOPY    . HERNIA REPAIR  09/15/11   Ventral w/mesh  . JOINT REPLACEMENT    . KNEE ARTHROPLASTY Right 12/22/2015   Procedure: COMPUTER ASSISTED TOTAL KNEE ARTHROPLASTY;  Surgeon: Marybelle Killings, MD;  Location: [facility];  Service: Orthopedics;  Laterality: Right;  . LAPAROSCOPIC CHOLECYSTECTOMY  2006   Dr. Bubba Camp  . TARSAL TUNNEL RELEASE Bilateral   . THYROIDECTOMY, PARTIAL    . VENTRAL HERNIA REPAIR  09/29/2011   Procedure: LAPAROSCOPIC VENTRAL HERNIA;  Surgeon: Adin Hector, MD;  Location[facility];  Service: General;  Laterality: N/A;  Laparoscopic Ventral Wall Hernia Repair with Mesh   Social History   Occupational History    Employer: [employer]  Tobacco Use  . Smoking status: Never Smoker  . Smokeless tobacco: Never Used  Substance and Sexual Activity  . Alcohol use: No    Alcohol/week: 1.0 standard drinks    Types: 1 Glasses of wine per week    . Drug use: No  . Sexual activity: Yes    Comment: Menarche age 86, first live birth age 74,  [place] P2. She stopped having periods approximately 1999 and took HRT until 2005.

## 2019-12-31 ENCOUNTER — Telehealth: Payer: Self-pay | Admitting: *Deleted

## 2019-12-31 NOTE — Telephone Encounter (Signed)
   [facility] Pre-operative Risk Assessment    HEARTCARE STAFF: - Please ensure there is not already an duplicate clearance open for this procedure. - Under Visit Info/Reason for Call, type in Other and utilize the format Clearance MM/DD/YY or Clearance TBD. Do not use dashes or single digits. - If request is for dental extraction, please clarify the # of teeth to be extracted.  Request for surgical clearance:  1. What type of surgery is being performed? 2 level cervical fusion  2. When is this surgery scheduled? TBD  3. What type of clearance is required (medical clearance vs. Pharmacy clearance to hold med vs. Both)? medical  4. Are there any medications that need to be held prior to surgery and how long? none  5. Practice name and name of physician performing surgery? Rodell Perna MD  6. What is the office phone number? [phone]   7.   What is the office fax number?  [phone]  8.   Anesthesia type (None, local, MAC, general) ? SPINAL   Paislie Tessler 12/31/2019, 4:20 PM  _________________________________________________________________   (provider comments below)

## 2020-01-01 ENCOUNTER — Other Ambulatory Visit: Payer: Self-pay

## 2020-01-01 NOTE — Telephone Encounter (Signed)
   Primary Cardiologist: Pixie Casino, MD  Chart reviewed as part of pre-operative protocol coverage. Given past medical history and time since last visit, based on ACC/AHA guidelines, Alyssa [name] would be at acceptable risk for the planned procedure without further cardiovascular testing.   She has a class RCRI I risk, 0.4% risk of major cardiac event.  I will route this recommendation to the requesting party via Epic fax function and remove from pre-op pool.  Please call with questions.  Jossie Ng. Troi Florendo NP-C    01/01/2020, 9:26 AM [place] [address] Office [phone] Fax [phone]

## 2020-01-08 ENCOUNTER — Ambulatory Visit (INDEPENDENT_AMBULATORY_CARE_PROVIDER_SITE_OTHER): Payer: Medicare PPO | Admitting: Surgery

## 2020-01-08 ENCOUNTER — Encounter: Payer: Self-pay | Admitting: Surgery

## 2020-01-08 VITALS — BP 139/82 | HR 71 | Ht 62.0 in | Wt 204.0 lb

## 2020-01-08 DIAGNOSIS — M4722 Other spondylosis with radiculopathy, cervical region: Secondary | ICD-10-CM

## 2020-01-08 NOTE — Progress Notes (Addendum)
69 year old white female history of C5-6 and C6-7 stenosis/HNP comes in for preop evaluation.  States that neck pain and right upper extremity radiculopathy unchanged from previous visit.  She is want to proceed with C5-6 and C6-7 ACDF as scheduled.  Today history and physical performed.  Review of systems positive for some lower pelvic discomfort.  Patient is currently being treated for UTI and on second day of antibiotic therapy.  Denies dysuria.  We have received preop cardiac clearance.  Surgical procedure discussed along with potential hospital stay.  All questions answered.

## 2020-01-13 ENCOUNTER — Other Ambulatory Visit (HOSPITAL_COMMUNITY)
Admission: RE | Admit: 2020-01-13 | Discharge: 2020-01-13 | Disposition: A | Discharge status: Home / Self Care | Payer: Medicare PPO | Source: Ambulatory Visit | Attending: Orthopaedic Surgery | Admitting: Orthopaedic Surgery

## 2020-01-13 DIAGNOSIS — Z20822 Contact with and (suspected) exposure to covid-19: Secondary | ICD-10-CM | POA: Insufficient documentation

## 2020-01-13 DIAGNOSIS — Z01812 Encounter for preprocedural laboratory examination: Secondary | ICD-10-CM | POA: Diagnosis present

## 2020-01-13 LAB — SARS CORONAVIRUS 2 (TAT 6-24 HRS): SARS Coronavirus 2: NEGATIVE

## 2020-01-13 NOTE — Pre-Procedure Instructions (Signed)
[facility] [place], [place] - [address] SEC OF [address] [place] [address] Phone: [phone] Fax: [phone]     Your procedure is scheduled on Friday, June 25 from 07:30 AM- 10:03 AM.  Report to [facility] Entr[facility] "A" at 05:30 A.M., and check in at the Admitting office.  Call this number if you have problems the morning of surgery:  [phone]  Call [phone] if you have any questions prior to your surgery date Monday-Friday 8am-4pm.    Remember:  Do not eat after midnight the night before your surgery.  You may drink clear liquids until 04:30 AM the morning of your surgery.    Clear liquids allowed are: Water, Non-Citrus Juices (without pulp), Carbonated Beverages, Clear Tea, Black Coffee Only, and Gatorade.    Enhanced Recovery after Surgery for Orthopedics Enhanced Recovery after Surgery is a protocol used to improve the stress on your body and your recovery after surgery.  Patient Instructions  . The night before surgery:  o No food after midnight. ONLY clear liquids after midnight   . The day of surgery (if you have diabetes): o  o Drink ONE (1) Gatorade 2 (G2) by 04:30 AM. o This drink was given to you during your hospital  pre-op appointment visit.  o Color of the Gatorade may vary. Red is not allowed. o Nothing else to drink after completing the  Gatorade 2 (G2).         If you have questions, please contact your surgeon's office.    Take these medicines the morning of surgery with A SIP OF WATER: fexofenadine (ALLEGRA) omeprazole (PRILOSEC)  oxybutynin (DITROPAN-XL)   IF NEEDED: albuterol (VENTOLIN HFA) inhaler Please bring all inhalers with you the day of surgery.   As of today, STOP taking any Aspirin (unless otherwise instructed by your surgeon) and Aspirin containing products, Aleve, Naproxen, Ibuprofen, Motrin, Advil, Goody's, BC's, all herbal medications, fish oil,  and all vitamins.   HOW TO MANAGE YOUR DIABETES BEFORE AND AFTER SURGERY  Why is it important to control my blood sugar before and after surgery? . Improving blood sugar levels before and after surgery helps healing and can limit problems. . A way of improving blood sugar control is eating a healthy diet by: o  Eating less sugar and carbohydrates o  Increasing activity/exercise o  Talking with your doctor about reaching your blood sugar goals . High blood sugars (greater than 180 mg/dL) can raise your risk of infections and slow your recovery, so you will need to focus on controlling your diabetes during the weeks before surgery. . Make sure that the doctor who takes care of your diabetes knows about your planned surgery including the date and location.  How do I manage my blood sugar before surgery? . Check your blood sugar at least 4 times a day, starting 2 days before surgery, to make sure that the level is not too high or low. . Check your blood sugar the morning of your surgery when you wake up and every 2 hours until you get to the Short Stay unit. o If your blood sugar is less than 70 mg/dL, you will need to treat for low blood sugar: - Do not take insulin. - Treat a low blood sugar (less than 70 mg/dL) with  cup of clear juice (cranberry or apple), 4 glucose tablets, OR glucose gel. - Recheck blood sugar in 15  minutes after treatment (to make sure it is greater than 70 mg/dL). If your blood sugar is not greater than 70 mg/dL on recheck, call [phone] for further instructions. . Report your blood sugar to the short stay nurse when you get to Short Stay.  . If you are admitted to the hospital after surgery: o Your blood sugar will be checked by the staff and you will probably be given insulin after surgery (instead of oral diabetes medicines) to make sure you have good blood sugar levels. o The goal for blood sugar control after surgery is 80-180 mg/dL.          The Morning  of Surgery:              Do not wear jewelry, make up, or nail polish.            Do not wear lotions, powders, perfumes, or deodorant.            Do not shave 48 hours prior to surgery.              Do not bring valuables to the hospital.            [facility] is not responsible for any belongings or valuables.  Do NOT Smoke (Tobacco/Vapping) or drink Alcohol 24 hours prior to your procedure.  If you use a CPAP at night, you may bring all equipment for your overnight stay.   Contacts, glasses, dentures or bridgework may not be worn into surgery.      For patients admitted to the hospital, discharge time will be determined by your treatment team.   Patients discharged the day of surgery will not be allowed to drive home, and someone needs to stay with them for 24 hours.    Special instructions:   [name]- Preparing For Surgery  Before surgery, you can play an important role. Because skin is not sterile, your skin needs to be as free of germs as possible. You can reduce the number of germs on your skin by washing with CHG (chlorahexidine gluconate) Soap before surgery.  CHG is an antiseptic cleaner which kills germs and bonds with the skin to continue killing germs even after washing.    Oral Hygiene is also important to reduce your risk of infection.  Remember - BRUSH YOUR TEETH THE MORNING OF SURGERY WITH YOUR REGULAR TOOTHPASTE  Please do not use if you have an allergy to CHG or antibacterial soaps. If your skin becomes reddened/irritated stop using the CHG.  Do not shave (including legs and underarms) for at least 48 hours prior to first CHG shower. It is OK to shave your face.  Please follow these instructions carefully.   1. Shower the NIGHT BEFORE SURGERY and the MORNING OF SURGERY with CHG Soap.   2. If you chose to wash your hair, wash your hair first as usual with your normal shampoo.  3. After you shampoo, rinse your hair and body thoroughly to remove the  shampoo.  4. Use CHG as you would any other liquid soap. You can apply CHG directly to the skin and wash gently with a scrungie or a clean washcloth.   5. Apply the CHG Soap to your body ONLY FROM THE NECK DOWN.  Do not use on open wounds or open sores. Avoid contact with your eyes, ears, mouth and genitals (private parts). Wash Face and genitals (private parts)  with your normal soap.   6. Wash thoroughly, paying special  attention to the area where your surgery will be performed.  7. Thoroughly rinse your body with warm water from the neck down.  8. DO NOT shower/wash with your normal soap after using and rinsing off the CHG Soap.  9. Pat yourself dry with a CLEAN TOWEL.  10. Wear CLEAN PAJAMAS to bed the night before surgery, wear comfortable clothes the morning of surgery  11. Place CLEAN SHEETS on your bed the night of your first shower and DO NOT SLEEP WITH PETS.   Day of Surgery: Shower with CHG Soap.  Do not apply any deodorants/lotions.  Please wear clean clothes to the hospital/surgery center.   Remember to brush your teeth WITH YOUR REGULAR TOOTHPASTE.   Please read over the following fact sheets that you were given.

## 2020-01-14 ENCOUNTER — Ambulatory Visit (HOSPITAL_COMMUNITY)
Admission: RE | Admit: 2020-01-14 | Discharge: 2020-01-14 | Disposition: A | Discharge status: Home / Self Care | Payer: Medicare PPO | Source: Ambulatory Visit | Attending: Surgery | Admitting: Surgery

## 2020-01-14 ENCOUNTER — Other Ambulatory Visit: Payer: Self-pay

## 2020-01-14 ENCOUNTER — Encounter (HOSPITAL_COMMUNITY)
Admission: RE | Admit: 2020-01-14 | Discharge: 2020-01-14 | Disposition: A | Discharge status: Home / Self Care | Payer: Medicare PPO | Source: Ambulatory Visit | Attending: Orthopaedic Surgery | Admitting: Orthopaedic Surgery

## 2020-01-14 ENCOUNTER — Encounter (HOSPITAL_COMMUNITY): Payer: Self-pay

## 2020-01-14 DIAGNOSIS — Z01818 Encounter for other preprocedural examination: Secondary | ICD-10-CM

## 2020-01-14 HISTORY — DX: Anxiety disorder, unspecified: F41.9

## 2020-01-14 HISTORY — DX: Unspecified asthma, uncomplicated: J45.909

## 2020-01-14 HISTORY — DX: Unspecified osteoarthritis, unspecified site: M19.90

## 2020-01-14 HISTORY — DX: Fatty (change of) liver, not elsewhere classified: K76.0

## 2020-01-14 LAB — COMPREHENSIVE METABOLIC PANEL
ALT: 74 U/L — ABNORMAL HIGH (ref 0–44)
AST: 48 U/L — ABNORMAL HIGH (ref 15–41)
Albumin: 4 g/dL (ref 3.5–5.0)
Alkaline Phosphatase: 44 U/L (ref 38–126)
Anion gap: 14 (ref 5–15)
BUN: 16 mg/dL (ref 8–23)
CO2: 28 mmol/L (ref 22–32)
Calcium: 9.7 mg/dL (ref 8.9–10.3)
Chloride: 99 mmol/L (ref 98–111)
Creatinine, Ser: 0.78 mg/dL (ref 0.44–1.00)
GFR calc Af Amer: >60 mL/min (ref >60)
GFR calc non Af Amer: >60 mL/min (ref >60)
Glucose, Bld: 106 mg/dL — ABNORMAL HIGH (ref 70–99)
Potassium: 3.4 mmol/L — ABNORMAL LOW (ref 3.5–5.1)
Sodium: 141 mmol/L (ref 135–145)
Total Bilirubin: 0.9 mg/dL (ref 0.3–1.2)
Total Protein: 7 g/dL (ref 6.5–8.1)

## 2020-01-14 LAB — CBC
HCT: 45.8 % (ref 36.0–46.0)
Hemoglobin: 14.4 g/dL (ref 12.0–15.0)
MCH: 28 pg (ref 26.0–34.0)
MCHC: 31.4 g/dL (ref 30.0–36.0)
MCV: 88.9 fL (ref 80.0–100.0)
Platelets: 280 10*3/uL (ref 150–400)
RBC: 5.15 MIL/uL — ABNORMAL HIGH (ref 3.87–5.11)
RDW: 13.5 % (ref 11.5–15.5)
WBC: 5.1 10*3/uL (ref 4.0–10.5)
nRBC: 0 % (ref 0.0–0.2)

## 2020-01-14 LAB — URINALYSIS, ROUTINE W REFLEX MICROSCOPIC
Bilirubin Urine: NEGATIVE
Glucose, UA: NEGATIVE mg/dL
Hgb urine dipstick: NEGATIVE
Ketones, ur: NEGATIVE mg/dL
Leukocytes,Ua: NEGATIVE
Nitrite: NEGATIVE
Protein, ur: NEGATIVE mg/dL
Specific Gravity, Urine: 1.015 (ref 1.005–1.030)
pH: 6 (ref 5.0–8.0)

## 2020-01-14 LAB — HEMOGLOBIN A1C
Hgb A1c MFr Bld: 6 % — ABNORMAL HIGH (ref 4.8–5.6)
Mean Plasma Glucose: 125.5 mg/dL

## 2020-01-14 LAB — SURGICAL PCR SCREEN
MRSA, PCR: NEGATIVE
Staphylococcus aureus: NEGATIVE

## 2020-01-14 LAB — GLUCOSE, CAPILLARY: Glucose-Capillary: 108 mg/dL — ABNORMAL HIGH (ref 70–99)

## 2020-01-14 NOTE — Progress Notes (Addendum)
PCP - Samantha A. Daron Offer, MD Cardiologist - Denies. Per patient, she only sees Dr. Lyman Bishop for hyperlipidemia, not cardiac issues.  PPM/ICD - Denies  Chest x-ray - 01/14/20 EKG - 01/14/20 Stress Test - Denies ECHO - Denies Cardiac Cath - Denies  Sleep Study - Yes, per patient, negative for OSA  Fasting Blood Sugar: ~80 Checks Blood Sugar X1 weekly. CBG at PAT appointment was 108. A1C obtained  Blood Thinner Instructions: N/A Aspirin Instructions: N/A  ERAS Protcol - Yes PRE-SURGERY Ensure or G2- Patient refused G2  COVID TEST- 01/13/20, negative; per patient, she has received COVID vaccination.   Anesthesia review: No  Patient denies shortness of breath, fever, cough and chest pain at PAT appointment   All instructions explained to the patient, with a verbal understanding of the material. Patient agrees to go over the instructions while at home for a better understanding. Patient also instructed to self quarantine after being tested for COVID-19. The opportunity to ask questions was provided.

## 2020-01-15 NOTE — H&P (Signed)
Patient: Alyssa [name]                                           Date of Birth: 08/31/1950                                                    MRN: [id number] Visit Date: 12/30/2019                                                                     Requested by: Nickola Major, MD [address] [place],  [place] [address] PCP: Nickola Major, MD   Assessment & Plan: Visit Diagnoses:  1. Other spondylosis with radiculopathy, cervical region     Plan: Patient been through conservative treatment greater than 6 months progressive symptoms with cervical spondylosis present foraminal stenosis at 1 to 2 mm retrolisthesis at C5-6 with prominent posterior vertebral body spurring. Patient had previous CT scan which showed some coronary artery calcification. Negative for chest pain or MI. We discussed two-level cervical fusion C5-6 C6-7 with allograft and plate postop soft collar x6 weeks. Questions were elicited and answered she understands request to proceed. We will seek cardiac clearance with her coronary calcification.  Follow-Up Instructions: No follow-ups on file.   Orders:  No orders of the defined types were placed in this encounter.  No orders of the defined types were placed in this encounter.     Procedures: No procedures performed   Clinical Data: No additional findings.   Subjective:     Chief Complaint  Patient presents with  . Neck - Pain, Follow-up    MRI Cervical Spine Review    HPI 69 year old female returns with ongoing problems with neck pain and radicular pain in her right arm. She does not pick up things such as coffee cups or other objects is concerned she will drop some and has dropped multiple objects. She has had previous carpal tunnel releases which did well. Hands do not bother her at night. MRI scan has been obtained and reviewed and she has some spondylosis with foraminal stenosis C5-6 C6-7.  Review of Systems Review of  systems negative for cardiac or pulmonary problems. Patient did have a history of pulmonary embolism years ago after she had hernia surgery. Previous history of left breast cancer estrogen receptor positive. Bilateral total knee arthroplasties doing well. Otherwise noncontributory as it pertains HPI.   Objective: Vital Signs: BP (!) 142/81   Pulse 75   Ht 5' 2"  (1.575 m)   Wt 204 lb (92.5 kg)   BMI 37.31 kg/m   Physical Exam Constitutional:      Appearance: She is well-developed.  HENT:     Head: Normocephalic.     Right Ear: External ear normal.     Left Ear: External ear normal.  Eyes:     Pupils: Pupils are equal, round, and reactive to light.  Neck:     Thyroid: No thyromegaly.     Trachea:  No tracheal deviation.  Cardiovascular:     Rate and Rhythm: Normal rate.  Pulmonary:     Effort: Pulmonary effort is normal.  Abdominal:     Palpations: Abdomen is soft.  Skin:    General: Skin is warm and dry.  Neurological:     Mental Status: She is alert and oriented to person, place, and time.  Psychiatric:        Behavior: Behavior normal.     Ortho Exam patient has brachial plexus tenderness on the right more than left. Positive Spurling on the right negative on the left. Well-healed carpal tunnel incisions no thenar atrophy. Reflexes are 1+ and symmetrical. Good wrist extension and flexion. Biceps triceps is strong. Increased pain with cervical compression slight improvement with distraction. Patient has well-healed carpal tunnel incisions. Well-healed anterior knee incisions knee and ankle jerk are 1+ and symmetrical no clonus.  Specialty Comments:  No specialty comments available.  Imaging: Study Result  CLINICAL DATA: Neck pain. Right upper extremity radiculopathy.  EXAM: MRI CERVICAL SPINE WITHOUT CONTRAST  TECHNIQUE: Multiplanar, multisequence MR imaging of the cervical spine was performed. No intravenous contrast was administered.  COMPARISON:  MRI the cervical spine 06/30/2018.  FINDINGS: Alignment: Slight retrolisthesis is present at C5-6. This is stable. No new or progressive listhesis is present.  Vertebrae: Endplate degenerative changes at C5-6 and C6-7 are again noted, similar the prior exam.  Cord: Normal signal and morphology.  Posterior Fossa, vertebral arteries, paraspinal tissues: Insert normal craniocervical  Disc levels:  C2-3: Moderate left-sided facet hypertrophy is again noted. Mild edema is present. Mild left foraminal stenosis is stable.  C3-4: Moderate facet hypertrophy is again noted. Mild left foraminal stenosis is stable. The central canal and right foramen are patent.  C4-5: A broad-based disc osteophyte complex is present. Partial effacement of ventral CSF is noted. Left-sided soft disc component is improved. Left foraminal narrowing is markedly improved. Mild right foraminal narrowing persists.  C5-6: A broad-based disc osteophyte complex is present. Ventral CSF is effaced. Moderate foraminal narrowing is stable right greater than left. There is no significant interval change.  C6-7: A broad-based disc protrusion is present. Central soft tissue component is noted. Moderate left and mild right foraminal narrowing is present.  C7-T1: Mild leftward disc protrusion is similar the prior study. No significant stenosis is present.  IMPRESSION: 1. Multilevel spondylosis of the cervical spine is similar to the prior study. 2. Mild left foraminal narrowing at C2-3 and C3-4 is stable. 3. Moderate to severe left-sided facet hypertrophy at C2-3 and C3-4 is also stable. There may be some edema at the C2-3 level. 4. Left-sided soft disc component at C4-5 is markedly improved. 5. Mild right foraminal narrowing at C4-5. 6. Moderate foraminal narrowing bilaterally at C5-6 is stable. 7. Moderate left and mild right foraminal stenosis at C6-7. 8. Mild leftward disc protrusion at C7-T1  without significant stenosis.   Electronically Signed By: San Morelle M.D. On: 12/23/2019 10:21       PMFS History:     Patient Active Problem List   Diagnosis Date Noted  . Other spondylosis with radiculopathy, cervical region 06/13/2018  . GERD (gastroesophageal reflux disease) 04/05/2018  . Allergic rhinitis 04/05/2018  . Upper airway cough syndrome 04/05/2018  . Patellar tendinitis, right knee 03/01/2017  . Status post total right knee replacement 12/22/2015  . Hepatic steatosis 02/09/2015  . Hot flashes 09/09/2014  . Osteopenia 09/09/2014  . Malignant neoplasm of upper-inner quadrant of left breast in female, estrogen receptor  positive ([place]) 07/23/2013  . Memory loss 07/14/2013  . Headache 07/14/2013  . Other malaise and fatigue 07/14/2013  . Acute pulmonary embolism ([place]) 10/07/2011  . Leukocytosis 10/07/2011  . 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 Hyponatremia 10/07/2011  . Obesity (BMI 30-39.9) 08/07/2011  . Post concussion syndrome        Past Medical History:  Diagnosis Date  . Abdominal pain   . Breast cancer ([place]) 06/2013   left upper inner  . Cancer ([place])    breast  . Diabetes mellitus without complication ([place])    Takes Tradjenta  . Family history of adverse reaction to anesthesia    Patients mother had to be resusciated after having anesthesia  . GERD (gastroesophageal reflux disease)   . Headache(784.0)    Takes Depakote  . Hernia, incisional periumbilical, incarcerated 0/02/6760  . Hot flashes   . Hx of radiation therapy 09/09/13- 10/13/13   left breast 5000 cGy in 25 sessions, no boost  . Hyperlipidemia   . Hypertension    PCP DR Nolon Rod  . Hypothyroidism   . Peripheral vascular disease ([place])    pulmonary embolis-still have some  . Post concussion syndrome    4 yrs ago- sees [facility]- Dr Leonie Man every 6 months-   has sleep study 2011 following accident but was neg  per patient-  short term memory issues, dates and times  . Pulmonary embolism ([place]) 2013  . Recurrent upper respiratory infection (URI)    FLU 08/23/11-08/29/11 then URI 08/29/11- 09/08/11- S/P zpack and steroids-  improved      no cough or congestion  . Thyroid disease   . 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 Ventral hernia          Family History  Problem Relation Age of Onset  . Malignant hyperthermia Mother   . Heart attack Father   . Stroke Father   . Diabetes Other        Grandmother  . Other Other        respiratory- Grandmother  . Cancer Maternal Grandmother 27       breast  . Breast cancer Maternal Grandmother          Past Surgical History:  Procedure Laterality Date  . APPENDECTOMY  2002   lap appy.  Dr. Hassell Done  . BREAST BIOPSY    . BREAST LUMPECTOMY Left 2015   radiation  . BREAST LUMPECTOMY WITH NEEDLE LOCALIZATION AND AXILLARY SENTINEL LYMPH NODE BX Left 08/04/2013   Procedure: BREAST LUMPECTOMY WITH NEEDLE LOCALIZATION AND AXILLARY SENTINEL LYMPH NODE Biopsy x 3;  Surgeon: Rolm Bookbinder, MD;  Location: [facility];  Service: General;  Laterality: Left;  . BREAST SURGERY    . CARPAL TUNNEL RELEASE  10/2009-12/2010   left - right  . CESAREAN SECTION  X 2  . COLONOSCOPY    . HERNIA REPAIR  09/15/11   Ventral w/mesh  . JOINT REPLACEMENT    . KNEE ARTHROPLASTY Right 12/22/2015   Procedure: COMPUTER ASSISTED TOTAL KNEE ARTHROPLASTY;  Surgeon: Marybelle Killings, MD;  Location: [facility];  Service: Orthopedics;  Laterality: Right;  . LAPAROSCOPIC CHOLECYSTECTOMY  2006   Dr. Bubba Camp  . TARSAL TUNNEL RELEASE Bilateral   . THYROIDECTOMY, PARTIAL    . VENTRAL HERNIA REPAIR  09/29/2011   Procedure: LAPAROSCOPIC VENTRAL HERNIA;  Surgeon: Adin Hector, MD;  Location: [facility];  Service: General;  Laterality: N/A;  Laparoscopic Ventral Wall Hernia Repair with  Mesh   Social History        Occupational History    Employer:  [employer]  Tobacco Use  . Smoking status: Never Smoker  . Smokeless tobacco: Never Used  Substance and Sexual Activity  . Alcohol use: No    Alcohol/week: 1.0 standard drinks    Types: 1 Glasses of wine per week  . Drug use: No  . Sexual activity: Yes    Comment: Menarche age 13, first live birth age 50,  [place] P2. She stopped having periods approximately 1999 and took HRT until 2005.

## 2020-01-15 NOTE — Anesthesia Preprocedure Evaluation (Addendum)
Anesthesia Evaluation  Patient identified by MRN, date of birth, ID band Patient awake    Reviewed: Allergy & Precautions, NPO status , Patient's Chart, lab work & pertinent test results  History of Anesthesia Complications Negative for: history of anesthetic complications  Airway Mallampati: II  TM Distance: >3 FB Neck ROM: Full    Dental no notable dental hx.    Pulmonary asthma , PE (2013)   Pulmonary exam normal        Cardiovascular hypertension, Pt. 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  Reproductive/Obstetrics negative OB ROS                           Anesthesia Physical Anesthesia Plan  ASA: II  Anesthesia Plan: General   Post-op Pain Management:    Induction: Intravenous  PONV Risk Score and Plan: 4 or greater and Treatment may vary due to age or medical condition, Ondansetron and Dexamethasone  Airway Management Planned: Oral ETT and Video Laryngoscope Planned  Additional Equipment: None  Intra-op Plan:   Post-operative Plan: Extubation in OR  Informed Consent: I have reviewed the patients History and Physical, chart, labs and discussed the procedure including the risks, benefits and alternatives for the proposed anesthesia with the patient or authorized representative who has indicated his/her understanding and acceptance.     Dental advisory given  Plan Discussed with: CRNA  Anesthesia Plan Comments:        Anesthesia Quick  Evaluation

## 2020-01-16 ENCOUNTER — Observation Stay (HOSPITAL_COMMUNITY)
Admission: RE | Admit: 2020-01-16 | Discharge: 2020-01-17 | Disposition: A | Discharge status: Home / Self Care | Payer: Medicare PPO | Source: Ambulatory Visit | Attending: Orthopaedic Surgery | Admitting: Orthopaedic Surgery

## 2020-01-16 ENCOUNTER — Ambulatory Visit (HOSPITAL_COMMUNITY): Payer: Medicare PPO | Admitting: Anesthesiology

## 2020-01-16 ENCOUNTER — Other Ambulatory Visit: Payer: Self-pay

## 2020-01-16 ENCOUNTER — Encounter (HOSPITAL_COMMUNITY): Payer: Self-pay | Admitting: Orthopaedic Surgery

## 2020-01-16 ENCOUNTER — Encounter (HOSPITAL_COMMUNITY)
Admission: RE | Disposition: A | Discharge status: Home / Self Care | Payer: Self-pay | Source: Ambulatory Visit | Attending: Orthopaedic Surgery

## 2020-01-16 ENCOUNTER — Ambulatory Visit (HOSPITAL_COMMUNITY): Payer: Medicare PPO

## 2020-01-16 DIAGNOSIS — Z79899 Other long term (current) drug therapy: Secondary | ICD-10-CM | POA: Diagnosis not present

## 2020-01-16 DIAGNOSIS — I2510 Atherosclerotic heart disease of native coronary artery without angina pectoris: Secondary | ICD-10-CM | POA: Diagnosis not present

## 2020-01-16 DIAGNOSIS — Z96653 Presence of artificial knee joint, bilateral: Secondary | ICD-10-CM | POA: Insufficient documentation

## 2020-01-16 DIAGNOSIS — Z6837 Body mass index (BMI) 37.0-37.9, adult: Secondary | ICD-10-CM | POA: Diagnosis not present

## 2020-01-16 DIAGNOSIS — Z886 Allergy status to analgesic agent status: Secondary | ICD-10-CM | POA: Diagnosis not present

## 2020-01-16 DIAGNOSIS — E1151 Type 2 diabetes mellitus with diabetic peripheral angiopathy without gangrene: Secondary | ICD-10-CM | POA: Insufficient documentation

## 2020-01-16 DIAGNOSIS — K219 Gastro-esophageal reflux disease without esophagitis: Secondary | ICD-10-CM | POA: Insufficient documentation

## 2020-01-16 DIAGNOSIS — Z86711 Personal history of pulmonary embolism: Secondary | ICD-10-CM | POA: Insufficient documentation

## 2020-01-16 DIAGNOSIS — Z888 Allergy status to other drugs, medicaments and biological substances status: Secondary | ICD-10-CM | POA: Diagnosis not present

## 2020-01-16 DIAGNOSIS — J45909 Unspecified asthma, uncomplicated: Secondary | ICD-10-CM | POA: Insufficient documentation

## 2020-01-16 DIAGNOSIS — Z885 Allergy status to narcotic agent status: Secondary | ICD-10-CM | POA: Diagnosis not present

## 2020-01-16 DIAGNOSIS — Z880 Allergy status to penicillin: Secondary | ICD-10-CM | POA: Insufficient documentation

## 2020-01-16 DIAGNOSIS — M4802 Spinal stenosis, cervical region: Secondary | ICD-10-CM | POA: Diagnosis not present

## 2020-01-16 DIAGNOSIS — M47812 Spondylosis without myelopathy or radiculopathy, cervical region: Secondary | ICD-10-CM | POA: Diagnosis present

## 2020-01-16 DIAGNOSIS — M5023 Other cervical disc displacement, cervicothoracic region: Secondary | ICD-10-CM | POA: Insufficient documentation

## 2020-01-16 DIAGNOSIS — I10 Essential (primary) hypertension: Secondary | ICD-10-CM | POA: Insufficient documentation

## 2020-01-16 DIAGNOSIS — R053 Chronic cough: Secondary | ICD-10-CM

## 2020-01-16 DIAGNOSIS — E669 Obesity, unspecified: Secondary | ICD-10-CM | POA: Diagnosis not present

## 2020-01-16 DIAGNOSIS — M4722 Other spondylosis with radiculopathy, cervical region: Principal | ICD-10-CM | POA: Insufficient documentation

## 2020-01-16 DIAGNOSIS — Z419 Encounter for procedure for purposes other than remedying health state, unspecified: Secondary | ICD-10-CM

## 2020-01-16 DIAGNOSIS — Z170 Estrogen receptor positive status [ER+]: Secondary | ICD-10-CM | POA: Diagnosis not present

## 2020-01-16 DIAGNOSIS — Z881 Allergy status to other antibiotic agents status: Secondary | ICD-10-CM | POA: Insufficient documentation

## 2020-01-16 DIAGNOSIS — E785 Hyperlipidemia, unspecified: Secondary | ICD-10-CM | POA: Insufficient documentation

## 2020-01-16 DIAGNOSIS — Z853 Personal history of malignant neoplasm of breast: Secondary | ICD-10-CM | POA: Diagnosis not present

## 2020-01-16 DIAGNOSIS — M1990 Unspecified osteoarthritis, unspecified site: Secondary | ICD-10-CM | POA: Diagnosis not present

## 2020-01-16 HISTORY — PX: ANTERIOR CERVICAL DECOMP/DISCECTOMY FUSION: SHX1161

## 2020-01-16 LAB — GLUCOSE, CAPILLARY
Glucose-Capillary: 108 mg/dL — ABNORMAL HIGH (ref 70–99)
Glucose-Capillary: 150 mg/dL — ABNORMAL HIGH (ref 70–99)
Glucose-Capillary: 157 mg/dL — ABNORMAL HIGH (ref 70–99)
Glucose-Capillary: 156 mg/dL — ABNORMAL HIGH (ref 70–99)
Glucose-Capillary: 52 mg/dL — ABNORMAL LOW (ref 70–99)

## 2020-01-16 SURGERY — ANTERIOR CERVICAL DECOMPRESSION/DISCECTOMY FUSION 2 LEVELS
Anesthesia: General

## 2020-01-16 MED ORDER — VANCOMYCIN HCL IN DEXTROSE 1-5 GM/200ML-% IV SOLN
1000.0000 mg | INTRAVENOUS | Status: AC
  Administered 2020-01-16: 1000 mg via INTRAVENOUS
  Filled 2020-01-16: qty 200

## 2020-01-16 MED ORDER — HYDROCODONE-ACETAMINOPHEN 5-325 MG PO TABS: 1.0000 | ORAL_TABLET | Freq: Four times a day (QID) | ORAL | 0 refills | Status: DC | PRN

## 2020-01-16 MED ORDER — SODIUM CHLORIDE 0.9% FLUSH
3.0000 mL | Freq: Two times a day (BID) | INTRAVENOUS | Status: DC
  Administered 2020-01-16: 3 mL via INTRAVENOUS

## 2020-01-16 MED ORDER — FENTANYL CITRATE (PF) 250 MCG/5ML IJ SOLN
INTRAMUSCULAR | Status: AC
  Filled 2020-01-16: qty 5

## 2020-01-16 MED ORDER — BUPIVACAINE-EPINEPHRINE 0.25% -1:200000 IJ SOLN
INTRAMUSCULAR | Status: DC | PRN
  Administered 2020-01-16: 6 mL

## 2020-01-16 MED ORDER — ROCURONIUM BROMIDE 10 MG/ML (PF) SYRINGE
PREFILLED_SYRINGE | INTRAVENOUS | Status: AC
  Filled 2020-01-16: qty 10

## 2020-01-16 MED ORDER — HYDROCHLOROTHIAZIDE 25 MG PO TABS
25.0000 mg | ORAL_TABLET | Freq: Every day | ORAL | Status: DC
  Administered 2020-01-16: 25 mg via ORAL
  Filled 2020-01-16: qty 1

## 2020-01-16 MED ORDER — METHOCARBAMOL 500 MG PO TABS
500.0000 mg | ORAL_TABLET | Freq: Four times a day (QID) | ORAL | Status: DC | PRN
  Administered 2020-01-16: 500 mg via ORAL

## 2020-01-16 MED ORDER — METHOCARBAMOL 500 MG PO TABS
ORAL_TABLET | ORAL | Status: AC
  Filled 2020-01-16: qty 1

## 2020-01-16 MED ORDER — LACTATED RINGERS IV SOLN: INTRAVENOUS | Status: DC | PRN

## 2020-01-16 MED ORDER — PROMETHAZINE HCL 25 MG/ML IJ SOLN: 6.2500 mg | INTRAMUSCULAR | Status: DC | PRN

## 2020-01-16 MED ORDER — ROCURONIUM BROMIDE 10 MG/ML (PF) SYRINGE
PREFILLED_SYRINGE | INTRAVENOUS | Status: DC | PRN
  Administered 2020-01-16: 60 mg via INTRAVENOUS
  Administered 2020-01-16 (×2): 10 mg via INTRAVENOUS

## 2020-01-16 MED ORDER — ALPRAZOLAM 0.5 MG PO TABS: 0.5000 mg | ORAL_TABLET | Freq: Every evening | ORAL | Status: DC | PRN

## 2020-01-16 MED ORDER — DEXAMETHASONE SODIUM PHOSPHATE 10 MG/ML IJ SOLN
INTRAMUSCULAR | Status: AC
  Filled 2020-01-16: qty 1

## 2020-01-16 MED ORDER — ONDANSETRON HCL 4 MG/2ML IJ SOLN: 4.0000 mg | Freq: Four times a day (QID) | INTRAMUSCULAR | Status: DC | PRN

## 2020-01-16 MED ORDER — OXYBUTYNIN CHLORIDE ER 10 MG PO TB24
10.0000 mg | ORAL_TABLET | Freq: Every day | ORAL | Status: DC
  Filled 2020-01-16: qty 1

## 2020-01-16 MED ORDER — ONDANSETRON HCL 4 MG PO TABS: 4.0000 mg | ORAL_TABLET | Freq: Four times a day (QID) | ORAL | Status: DC | PRN

## 2020-01-16 MED ORDER — AMPHETAMINE-DEXTROAMPHETAMINE 10 MG PO TABS: 20.0000 mg | ORAL_TABLET | Freq: Two times a day (BID) | ORAL | Status: DC

## 2020-01-16 MED ORDER — ONDANSETRON HCL 4 MG/2ML IJ SOLN
INTRAMUSCULAR | Status: AC
  Filled 2020-01-16: qty 2

## 2020-01-16 MED ORDER — FENTANYL CITRATE (PF) 250 MCG/5ML IJ SOLN
INTRAMUSCULAR | Status: DC | PRN
  Administered 2020-01-16: 50 ug via INTRAVENOUS
  Administered 2020-01-16: 100 ug via INTRAVENOUS
  Administered 2020-01-16 (×3): 50 ug via INTRAVENOUS

## 2020-01-16 MED ORDER — SODIUM CHLORIDE 0.9% FLUSH: 3.0000 mL | INTRAVENOUS | Status: DC | PRN

## 2020-01-16 MED ORDER — LIDOCAINE 2% (20 MG/ML) 5 ML SYRINGE
INTRAMUSCULAR | Status: DC | PRN
  Administered 2020-01-16: 100 mg via INTRAVENOUS

## 2020-01-16 MED ORDER — FENTANYL CITRATE (PF) 100 MCG/2ML IJ SOLN
25.0000 ug | INTRAMUSCULAR | Status: DC | PRN
  Administered 2020-01-16: 50 ug via INTRAVENOUS

## 2020-01-16 MED ORDER — MENTHOL 3 MG MT LOZG: 1.0000 | LOZENGE | OROMUCOSAL | Status: DC | PRN

## 2020-01-16 MED ORDER — PANTOPRAZOLE SODIUM 40 MG PO TBEC: 40.0000 mg | DELAYED_RELEASE_TABLET | Freq: Every day | ORAL | Status: DC

## 2020-01-16 MED ORDER — ONDANSETRON HCL 4 MG/2ML IJ SOLN
INTRAMUSCULAR | Status: DC | PRN
  Administered 2020-01-16: 4 mg via INTRAVENOUS

## 2020-01-16 MED ORDER — MIDAZOLAM HCL 2 MG/2ML IJ SOLN
INTRAMUSCULAR | Status: AC
  Filled 2020-01-16: qty 2

## 2020-01-16 MED ORDER — BUDESONIDE 0.25 MG/2ML IN SUSP
0.2500 mg | Freq: Two times a day (BID) | RESPIRATORY_TRACT | Status: DC
  Filled 2020-01-16 (×2): qty 2

## 2020-01-16 MED ORDER — SODIUM CHLORIDE 0.9 % IR SOLN
Status: DC | PRN
  Administered 2020-01-16: 1000 mL

## 2020-01-16 MED ORDER — CHLORHEXIDINE GLUCONATE 0.12 % MT SOLN
15.0000 mL | Freq: Once | OROMUCOSAL | Status: AC
  Administered 2020-01-16 (×2): 15 mL via OROMUCOSAL
  Filled 2020-01-16: qty 15

## 2020-01-16 MED ORDER — PROPOFOL 10 MG/ML IV BOLUS
INTRAVENOUS | Status: AC
  Filled 2020-01-16: qty 20

## 2020-01-16 MED ORDER — LIDOCAINE 2% (20 MG/ML) 5 ML SYRINGE
INTRAMUSCULAR | Status: AC
  Filled 2020-01-16: qty 5

## 2020-01-16 MED ORDER — PHENOL 1.4 % MT LIQD: 1.0000 | OROMUCOSAL | Status: DC | PRN

## 2020-01-16 MED ORDER — EVOLOCUMAB 140 MG/ML ~~LOC~~ SOAJ: 140.0000 mg | SUBCUTANEOUS | Status: DC

## 2020-01-16 MED ORDER — CELECOXIB 200 MG PO CAPS
200.0000 mg | ORAL_CAPSULE | Freq: Two times a day (BID) | ORAL | Status: DC
  Administered 2020-01-16 (×2): 200 mg via ORAL
  Filled 2020-01-16 (×2): qty 1

## 2020-01-16 MED ORDER — ORAL CARE MOUTH RINSE: 15.0000 mL | Freq: Once | OROMUCOSAL | Status: AC

## 2020-01-16 MED ORDER — SODIUM CHLORIDE 0.45 % IV SOLN: INTRAVENOUS | Status: DC

## 2020-01-16 MED ORDER — DEXAMETHASONE SODIUM PHOSPHATE 10 MG/ML IJ SOLN
INTRAMUSCULAR | Status: DC | PRN
  Administered 2020-01-16: 10 mg via INTRAVENOUS

## 2020-01-16 MED ORDER — MIDAZOLAM HCL 2 MG/2ML IJ SOLN
INTRAMUSCULAR | Status: DC | PRN
  Administered 2020-01-16: 2 mg via INTRAVENOUS

## 2020-01-16 MED ORDER — METHOCARBAMOL 1000 MG/10ML IJ SOLN
500.0000 mg | Freq: Four times a day (QID) | INTRAVENOUS | Status: DC | PRN
  Filled 2020-01-16: qty 5

## 2020-01-16 MED ORDER — OXYCODONE-ACETAMINOPHEN 5-325 MG PO TABS
ORAL_TABLET | ORAL | Status: AC
  Administered 2020-01-16: 2
  Filled 2020-01-16: qty 2

## 2020-01-16 MED ORDER — DIVALPROEX SODIUM ER 500 MG PO TB24
500.0000 mg | ORAL_TABLET | Freq: Two times a day (BID) | ORAL | Status: DC
  Administered 2020-01-16 (×2): 500 mg via ORAL
  Filled 2020-01-16 (×3): qty 1

## 2020-01-16 MED ORDER — HYDROCODONE-ACETAMINOPHEN 5-325 MG PO TABS
1.0000 | ORAL_TABLET | ORAL | Status: DC | PRN
  Administered 2020-01-16: 2 via ORAL
  Filled 2020-01-16: qty 2

## 2020-01-16 MED ORDER — FENTANYL CITRATE (PF) 100 MCG/2ML IJ SOLN
INTRAMUSCULAR | Status: AC
  Filled 2020-01-16: qty 2

## 2020-01-16 MED ORDER — LORATADINE 10 MG PO TABS: 10.0000 mg | ORAL_TABLET | Freq: Every day | ORAL | Status: DC

## 2020-01-16 MED ORDER — MORPHINE SULFATE (PF) 2 MG/ML IV SOLN: 2.0000 mg | INTRAVENOUS | Status: DC | PRN

## 2020-01-16 MED ORDER — PROPOFOL 10 MG/ML IV BOLUS
INTRAVENOUS | Status: DC | PRN
  Administered 2020-01-16: 20 mg via INTRAVENOUS
  Administered 2020-01-16: 180 mg via INTRAVENOUS

## 2020-01-16 MED ORDER — ALBUTEROL SULFATE (2.5 MG/3ML) 0.083% IN NEBU: 3.0000 mL | INHALATION_SOLUTION | Freq: Four times a day (QID) | RESPIRATORY_TRACT | Status: DC | PRN

## 2020-01-16 MED ORDER — SUGAMMADEX SODIUM 200 MG/2ML IV SOLN
INTRAVENOUS | Status: DC | PRN
  Administered 2020-01-16: 200 mg via INTRAVENOUS

## 2020-01-16 SURGICAL SUPPLY — 46 items
AGENT HMST KT MTR STRL THRMB (HEMOSTASIS) ×1
APL SKNCLS STERI-STRIP NONHPOA (GAUZE/BANDAGES/DRESSINGS) ×1
BENZOIN TINCTURE PRP APPL 2/3 (GAUZE/BANDAGES/DRESSINGS) ×2 IMPLANT
BIT DRILL SPINE QC 12 (BIT) ×1 IMPLANT
BUR ROUND FLUTED 4 SOFT TCH (BURR) ×1 IMPLANT
COLLAR CERV LO CONTOUR FIRM DE (SOFTGOODS) ×1 IMPLANT
CORD BIPOLAR FORCEPS 12FT (ELECTRODE) ×2 IMPLANT
COVER SURGICAL LIGHT HANDLE (MISCELLANEOUS) ×2 IMPLANT
DRAPE C-ARM 42X72 X-RAY (DRAPES) ×2 IMPLANT
DRAPE HALF SHEET 40X57 (DRAPES) ×2 IMPLANT
DRAPE MICROSCOPE LEICA (MISCELLANEOUS) ×2 IMPLANT
DURAPREP 6ML APPLICATOR 50/CS (WOUND CARE) ×2 IMPLANT
ELECT COATED BLADE 2.86 ST (ELECTRODE) ×2 IMPLANT
ELECT REM PT RETURN 9FT ADLT (ELECTROSURGICAL) ×2
ELECTRODE REM PT RTRN 9FT ADLT (ELECTROSURGICAL) ×1 IMPLANT
EVACUATOR 1/8 PVC DRAIN (DRAIN) ×1 IMPLANT
GAUZE SPONGE 4X4 12PLY STRL (GAUZE/BANDAGES/DRESSINGS) ×1 IMPLANT
GLOVE BIOGEL PI IND STRL 8 (GLOVE) ×2 IMPLANT
GLOVE BIOGEL PI INDICATOR 8 (GLOVE) ×2
GLOVE ORTHO TXT STRL SZ7.5 (GLOVE) ×4 IMPLANT
GOWN STRL REUS W/ TWL LRG LVL3 (GOWN DISPOSABLE) ×1 IMPLANT
GOWN STRL REUS W/ TWL XL LVL3 (GOWN DISPOSABLE) ×1 IMPLANT
GOWN STRL REUS W/TWL 2XL LVL3 (GOWN DISPOSABLE) ×2 IMPLANT
GOWN STRL REUS W/TWL LRG LVL3 (GOWN DISPOSABLE) ×2
GOWN STRL REUS W/TWL XL LVL3 (GOWN DISPOSABLE) ×2
HALTER HD/CHIN CERV TRACTION D (MISCELLANEOUS) ×2 IMPLANT
KIT BASIN OR (CUSTOM PROCEDURE TRAY) ×2 IMPLANT
KIT TURNOVER KIT B (KITS) ×2 IMPLANT
NDL 25GX 5/8IN NON SAFETY (NEEDLE) ×1 IMPLANT
NEEDLE 25GX 5/8IN NON SAFETY (NEEDLE) ×2 IMPLANT
NS IRRIG 1000ML POUR BTL (IV SOLUTION) ×2 IMPLANT
PACK ORTHO CERVICAL (CUSTOM PROCEDURE TRAY) ×2 IMPLANT
PIN TEMP FIXATION KIRSCHNER (EXFIX) ×1 IMPLANT
PLATE ANT CERV XTEND ELD 1 L12 (Plate) ×1 IMPLANT
POSITIONER HEAD DONUT 9IN (MISCELLANEOUS) ×2 IMPLANT
RESTRAINT LIMB HOLDER UNIV (RESTRAINTS) ×1 IMPLANT
SCREW XTD VAR 4.2 SELF TAP 12 (Screw) ×6 IMPLANT
SPACER CERVICAL FRGE 12X14X6-7 (Spacer) ×1 IMPLANT
SPACER CERVICAL FRGE 12X14X7-7 (Spacer) ×1 IMPLANT
STRIP CLOSURE SKIN 1/2X4 (GAUZE/BANDAGES/DRESSINGS) ×2 IMPLANT
SURGIFLO W/THROMBIN 8M KIT (HEMOSTASIS) ×1 IMPLANT
SUT BONE WAX W31G (SUTURE) ×2 IMPLANT
SUT VICRYL 4-0 PS2 18IN ABS (SUTURE) ×3 IMPLANT
TAPE CLOTH SURG 6X10 WHT LF (GAUZE/BANDAGES/DRESSINGS) ×1 IMPLANT
TOWEL GREEN STERILE (TOWEL DISPOSABLE) ×2 IMPLANT
TOWEL GREEN STERILE FF (TOWEL DISPOSABLE) ×2 IMPLANT

## 2020-01-16 NOTE — Progress Notes (Signed)
Orthopedic Tech Progress Note Patient Details:  Alyssa [name] June 10, 1951 [id number]  Ortho Devices Type of Ortho Device: Soft collar Ortho Device/Splint Location: neck Ortho Device/Splint Interventions: Other (comment)   Post Interventions Patient Tolerated: Other (comment) Instructions Provided: Other (comment)   Janit Pagan 01/16/2020, 1:17 PM

## 2020-01-16 NOTE — Anesthesia Postprocedure Evaluation (Signed)
Anesthesia Post Note  Patient: Alyssa [name]  Procedure(s) Performed: c5-6, c6-7 ANTERIOR CERVICAL DECOMPRESSION/DISCECTOMY FUSION, ALLOGRAFT AND PLATE (N/A )     Patient location during evaluation: PACU Anesthesia Type: General Level of consciousness: awake and alert and oriented Pain management: pain level controlled Vital Signs Assessment: post-procedure vital signs reviewed and stable Respiratory status: spontaneous breathing, nonlabored ventilation and respiratory function stable Cardiovascular status: blood pressure returned to baseline Postop Assessment: no apparent nausea or vomiting Anesthetic complications: no   No complications documented.  Last Vitals:  Vitals:   01/16/20 1145 01/16/20 1200  BP: (!) 148/66 (!) 153/63  Pulse: 66 68  Resp: 10 10  Temp:    SpO2: 94% 96%    Last Pain:  Vitals:   01/16/20 1200  TempSrc:   PainSc: 0-No pain                 Brennan Bailey

## 2020-01-16 NOTE — Interval H&P Note (Signed)
History and Physical Interval Note:  01/16/2020 7:22 AM  Alyssa [name]  has presented today for surgery, with the diagnosis of C5-6, C6-7 spondylosis, foraminal stenosis.  The various methods of treatment have been discussed with the patient and family. After consideration of risks, benefits and other options for treatment, the patient has consented to  Procedure(s): c5-6, c6-7 ANTERIOR CERVICAL DECOMPRESSION/DISCECTOMY FUSION, ALLOGRAFT AND PLATE (N/A) as a surgical intervention.  The patient's history has been reviewed, patient examined, no change in status, stable for surgery.  I have reviewed the patient's chart and labs.  Questions were answered to the patient's satisfaction.     Marybelle Killings

## 2020-01-16 NOTE — Progress Notes (Signed)
HV not working . Tubing connected and holes for drain beneath skin. Was taped in OR and has not pulled back. Either hole in tubing or cannister. HV removed.

## 2020-01-16 NOTE — Anesthesia Procedure Notes (Signed)
Procedure Name: Intubation Performed by: Michele Rockers, CRNA Pre-anesthesia Checklist: Patient identified, Emergency Drugs available, Suction available and Patient being monitored Patient Re-evaluated:Patient Re-evaluated prior to induction Oxygen Delivery Method: Circle system utilized Preoxygenation: Pre-oxygenation with 100% oxygen Induction Type: IV induction Ventilation: Mask ventilation without difficulty Tube type: Oral Number of attempts: 1 Airway Equipment and Method: Stylet and Oral airway Placement Confirmation: ETT inserted through vocal cords under direct vision,  positive ETCO2 and breath sounds checked- equal and bilateral Tube secured with: Tape Dental Injury: Teeth and Oropharynx as per pre-operative assessment

## 2020-01-16 NOTE — Op Note (Signed)
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  Postop diagnosis: Same  Procedure: C5-6, C6-7 anterior cervical discectomy and fusion, allograft and plate.  Surgeon: Rodell Perna, MD  Assistant: Larkin Ina RNFA  Anesthesia General orotracheal +6 cc Marcaine local  Implants: Globus Forge cervical plate 32 mm.  12 mm screws x6.  6 mm cortical cancellous allograft at C5-6 and 7 mm graft at C6-7 level.  Procedure: After induction general anesthesia wrist restraints had all traction without weight intubation with glide scope prepping with DuraPrep area squared with towels sterile skin marker was used in the old thyroid incision and Betadine Steri-Drape sterile metal stent the head and thyroid sheets and drapes.  Preoperative vancomycin due to the patient's multiple allergies.  Incision was made using the old scar platysma was divided.  Underneath the omohyoid blunt dissection down the prominent spurs at C5-6 C6-7.  C5-6 level had more severe disease it was surgically addressed first and identified with a short 25 needle placed with a straight clamp and confirmed with a sterilely draped C arm.  Open microscope was draped and brought in.  Anterior spurs removed with a bare rondure some with the bur.  Discectomy was performed posterior portion was taken down using operative microscope removing chunks of disc overhanging spurring decompression the dura until it was round.  Some Surgi-Flo was used in the gutters.  Bone was removed all the way out to the uncovertebral joint on the left side.  Trial sizer showed 6 graft gave a nice fit.  Graft was marked anteriorly with a vertical line inserted countersunk 1 mm.  Identical procedure was repeated at the C6/7 level.  Again there was spurs disc protrusions with compression both right and left which were decompressed.  A 7 mm Forge cortical cancellous graft was placed at this level.  Plate was selected checked under x-ray and ingested until it was in the  midline on AP.  All screws were drilled inserted and then final spot pictures were taken confirming good position alignment.  Screws were locked in using a tiny screwdriver until it clicked x6.  Hemovac placed in technique.  Operative area was dry.  Platysma closed with 3-0 Vicryl for Vicryl subcuticular closure tincture benzoin Steri-Strips marked of traction postop dressing and transferred recovery room after soft collar application.

## 2020-01-16 NOTE — Anesthesia Procedure Notes (Signed)
Procedure Name: Intubation Date/Time: 01/16/2020 8:46 AM Performed by: Michele Rockers, CRNA Pre-anesthesia Checklist: Patient identified, Emergency Drugs available, Suction available and Patient being monitored Patient Re-evaluated:Patient Re-evaluated prior to induction Oxygen Delivery Method: Circle system utilized Preoxygenation: Pre-oxygenation with 100% oxygen Induction Type: IV induction Ventilation: Mask ventilation without difficulty Laryngoscope Size: Mac and 3 Tube type: Oral Tube size: 7.5 mm Number of attempts: 1 Airway Equipment and Method: Oral airway,  Video-laryngoscopy and Stylet Placement Confirmation: ETT inserted through vocal cords under direct vision,  positive ETCO2 and breath sounds checked- equal and bilateral Tube secured with: Tape Dental Injury: Teeth and Oropharynx as per pre-operative assessment

## 2020-01-16 NOTE — Transfer of Care (Signed)
Immediate Anesthesia Transfer of Care Note  Patient: Alyssa [name]  Procedure(s) Performed: c5-6, c6-7 ANTERIOR CERVICAL DECOMPRESSION/DISCECTOMY FUSION, ALLOGRAFT AND PLATE (N/A )  Patient Location: PACU  Anesthesia Type:General  Level of Consciousness: drowsy, patient cooperative and responds to stimulation  Airway & Oxygen Therapy: Patient Spontanous Breathing and Patient connected to face mask oxygen  Post-op Assessment: Report given to RN and Post -op Vital signs reviewed and stable  Post vital signs: Reviewed and stable  Last Vitals:  Vitals Value Taken Time  BP 154/69 01/16/20 1043  Temp    Pulse 81 01/16/20 1043  Resp 15 01/16/20 1043  SpO2 96 % 01/16/20 1043  Vitals shown include unvalidated device data.  Last Pain:  Vitals:   01/16/20 0655  TempSrc:   PainSc: 0-No pain         Complications: No complications documented.

## 2020-01-17 DIAGNOSIS — M4722 Other spondylosis with radiculopathy, cervical region: Secondary | ICD-10-CM | POA: Diagnosis not present

## 2020-01-17 LAB — GLUCOSE, CAPILLARY: Glucose-Capillary: 100 mg/dL — ABNORMAL HIGH (ref 70–99)

## 2020-01-17 NOTE — Progress Notes (Signed)
Patient is discharged from room 3C02 at this time. Alert and in stable condition. IV site d/c'd and instructions read to patient with understanding verbalized and all questions answered. Ambulate out of unit with all belongings at side.

## 2020-01-17 NOTE — Progress Notes (Signed)
Subjective: 1 Day Post-Op Procedure(s) (LRB): c5-6, c6-7 ANTERIOR CERVICAL DECOMPRESSION/DISCECTOMY FUSION, ALLOGRAFT AND PLATE (N/A) Patient reports pain as 0 on 0-10 scale.  No complaints.   Objective: Vital signs in last 24 hours: Temp:  [97.3 F (36.3 C)-98.4 F (36.9 C)] 98.4 F (36.9 C) (06/26 0719) Pulse Rate:  [55-85] 57 (06/26 0719) Resp:  [10-19] 19 (06/26 0719) BP: (134-163)/(60-89) 145/71 (06/26 0719) SpO2:  [91 %-98 %] 96 % (06/26 0719)  Intake/Output from previous day: 06/25 0701 - 06/26 0700 In: 1303 [I.V.:1003; IV Piggyback:200] Out: 150 [Blood:150] Intake/Output this shift: No intake/output data recorded.  No results for input(s): HGB in the last 72 hours. No results for input(s): WBC, RBC, HCT, PLT in the last 72 hours. No results for input(s): NA, K, CL, CO2, BUN, CREATININE, GLUCOSE, CALCIUM in the last 72 hours. No results for input(s): LABPT, INR in the last 72 hours.  Upper extremities: Neurovascular intact Sensation intact distally Intact pulses distally Cervical : Collar in place  Dressing clean dry and inatct  Assessment/Plan: 1 Day Post-Op Procedure(s) (LRB): c5-6, c6-7 ANTERIOR CERVICAL DECOMPRESSION/DISCECTOMY FUSION, ALLOGRAFT AND PLATE (N/A) Discharge home with home health      [place] 01/17/2020, 10:07 AM

## 2020-01-17 NOTE — Discharge Instructions (Signed)
Leave your dressing on until you return to the office in 1 week.  You  have an extra collar that you can apply when you shower and you can wrap the collar and Saran wrap and then switch collars.  Then to dry off remove the collar with Saran wrap and reapply the dry collar.  You will do better if you stay with softer foods for 1 or 2 weeks.  You can expect to have slight problems swallowing.  You will do better if you sleep in a recliner type position or propped up so your neck does not swell as much.  Pain medication has been sent into your pharmacy.  Keep your collar on at all times except when you swap it off with a shower collar.

## 2020-01-19 ENCOUNTER — Encounter (HOSPITAL_COMMUNITY): Payer: Self-pay | Admitting: Orthopaedic Surgery

## 2020-01-27 ENCOUNTER — Encounter: Payer: Self-pay | Admitting: Orthopaedic Surgery

## 2020-01-27 ENCOUNTER — Ambulatory Visit: Payer: Self-pay

## 2020-01-27 ENCOUNTER — Ambulatory Visit (INDEPENDENT_AMBULATORY_CARE_PROVIDER_SITE_OTHER): Payer: Medicare PPO | Admitting: Orthopaedic Surgery

## 2020-01-27 VITALS — Ht 62.0 in | Wt 207.0 lb

## 2020-01-27 DIAGNOSIS — Z981 Arthrodesis status: Secondary | ICD-10-CM

## 2020-01-27 NOTE — Progress Notes (Signed)
Post-Op Visit Note   Patient: Alyssa [name]           Date of Birth: Jul 13, 1951           MRN: [id number] Visit Date: 01/27/2020 PCP: Nickola Major, MD   Assessment & Plan: Post two-level cervical fusion.  Numbness and tingling in her hands forearms are gone she still has soreness in her neck radiates in her shoulders down to the elbow which is new likely related to restoration of disc space height with her graft.  2 view x-rays look good.  She can use some Tylenol or ibuprofen.  Return 5 weeks for lateral flexion-extension x-ray on return.  Chief Complaint:  Chief Complaint  Patient presents with  . Neck - Routine Post Op    01/16/2020 C5-6, C6-7 ACDF, Allograft, Plate   Visit Diagnoses:  1. Status post cervical spinal fusion     Plan: Return 5 weeks flexion-extension x-rays on return.  Follow-Up Instructions: No follow-ups on file.   Orders:  Orders Placed This Encounter  Procedures  . XR Cervical Spine 2 or 3 views   No orders of the defined types were placed in this encounter.   Imaging: No results found.  PMFS History: Patient Active Problem List   Diagnosis Date Noted  . Spondylosis of cervical region without myelopathy or radiculopathy 01/16/2020  . Other spondylosis with radiculopathy, cervical region 06/13/2018  . GERD (gastroesophageal reflux disease) 04/05/2018  . Allergic rhinitis 04/05/2018  . Upper airway cough syndrome 04/05/2018  . Patellar tendinitis, right knee 03/01/2017  . Status post total right knee replacement 12/22/2015  . Hepatic steatosis 02/09/2015  . Hot flashes 09/09/2014  . Osteopenia 09/09/2014  . Malignant neoplasm of upper-inner quadrant of left breast in female, estrogen receptor positive ([place]) 07/23/2013  . Memory loss 07/14/2013  . Headache 07/14/2013  . Other malaise and fatigue 07/14/2013  . Acute pulmonary embolism ([place]) 10/07/2011  . Leukocytosis 10/07/2011  . Thyroid mass, left inferior lobe, 3.6cm [id number]  10/07/2011  . Hyponatremia 10/07/2011  . Obesity (BMI 30-39.9) 08/07/2011  . Post concussion syndrome    Past Medical History:  Diagnosis Date  . Abdominal pain   . Anxiety   . Arthritis   . Asthma   . Breast cancer ([place]) 06/2013   left upper inner  . Cancer ([place])    breast  . Diabetes mellitus without complication ([place])    Takes Tradjenta  . Family history of adverse reaction to anesthesia    Patients mother had to be resusciated after having anesthesia  . GERD (gastroesophageal reflux disease)   . Headache(784.0)    Takes Depakote  . Hernia, incisional periumbilical, incarcerated 11/09/6220  . Hot flashes   . Hx of radiation therapy 09/09/13- 10/13/13   left breast 5000 cGy in 25 sessions, no boost  . Hyperlipidemia   . Hypertension    PCP DR Nolon Rod  . Hypothyroidism   . Non-alcoholic fatty liver disease   . Peripheral vascular disease ([place])    pulmonary embolis-still have some  . Post concussion syndrome    4 yrs ago- sees [facility]- Dr Leonie Man every 6 months-   has sleep study 2011 following accident but was neg per patient-  short term memory issues, dates and times  . Pulmonary embolism ([place]) 2013  . Recurrent upper respiratory infection (URI)    FLU 08/23/11-08/29/11 then URI 08/29/11- 09/08/11- S/P zpack and steroids-  improved      no cough or congestion  .  Thyroid disease   . Thyroid mass, left inferior lobe, 3.6cm [id number] 10/07/2011  . Ventral hernia     Family History  Problem Relation Age of Onset  . Malignant hyperthermia Mother   . Heart attack Father   . Stroke Father   . Diabetes Other        Grandmother  . Other Other        respiratory- Grandmother  . Cancer Maternal Grandmother 35       breast  . Breast cancer Maternal Grandmother     Past Surgical History:  Procedure Laterality Date  . ANTERIOR CERVICAL DECOMP/DISCECTOMY FUSION N/A 01/16/2020   Procedure: c5-6, c6-7 ANTERIOR CERVICAL DECOMPRESSION/DISCECTOMY FUSION, ALLOGRAFT AND PLATE;   Surgeon: Marybelle Killings, MD;  Location: [facility];  Service: Orthopedics;  Laterality: N/A;  . APPENDECTOMY  2002   lap appy.  Dr. Hassell Done  . BREAST BIOPSY    . BREAST LUMPECTOMY Left 2015   radiation  . BREAST LUMPECTOMY WITH NEEDLE LOCALIZATION AND AXILLARY SENTINEL LYMPH NODE BX Left 08/04/2013   Procedure: BREAST LUMPECTOMY WITH NEEDLE LOCALIZATION AND AXILLARY SENTINEL LYMPH NODE Biopsy x 3;  Surgeon: Rolm Bookbinder, MD;  Location: [facility];  Service: General;  Laterality: Left;  . BREAST SURGERY    . CARPAL TUNNEL RELEASE  10/2009-12/2010   left - right  . CESAREAN SECTION  X 2  . COLONOSCOPY    . DIAGNOSTIC LAPAROSCOPY     Lap. chole., appendectomy, hernia repair  . HERNIA REPAIR  09/15/11   Ventral w/mesh  . JOINT REPLACEMENT    . KNEE ARTHROPLASTY Right 12/22/2015   Procedure: COMPUTER ASSISTED TOTAL KNEE ARTHROPLASTY;  Surgeon: Marybelle Killings, MD;  Location: [facility];  Service: Orthopedics;  Laterality: Right;  . LAPAROSCOPIC CHOLECYSTECTOMY  2006   Dr. Bubba Camp  . TARSAL TUNNEL RELEASE Bilateral   . THYROIDECTOMY, PARTIAL    . VENTRAL HERNIA REPAIR  09/29/2011   Procedure: LAPAROSCOPIC VENTRAL HERNIA;  Surgeon: Adin Hector, MD;  Location: [facility];  Service: General;  Laterality: N/A;  Laparoscopic Ventral Wall Hernia Repair with Mesh   Social History   Occupational History    Employer: [employer]  Tobacco Use  . Smoking status: Never Smoker  . Smokeless tobacco: Never Used  Vaping Use  . Vaping Use: Never used  Substance and Sexual Activity  . Alcohol use: No    Alcohol/week: 1.0 standard drink    Types: 1 Glasses of wine per week  . Drug use: No  . Sexual activity: Yes    Comment: Menarche age 82, first live birth age 47,  [place] P2. She stopped having periods approximately 1999 and took HRT until 2005.

## 2020-01-27 NOTE — Discharge Summary (Addendum)
Patient ID: Alyssa [name] MRN: [id number] DOB/AGE: 11-21-1950 69 y.o.  Admit date: 01/16/2020 Discharge date: 01/17/2020 Admission Diagnoses:  Cervical spondylosis with radiculopathy  Discharge Diagnoses:   status post Procedure(s): c5-6, c6-7 ANTERIOR CERVICAL DECOMPRESSION/DISCECTOMY FUSION, ALLOGRAFT AND PLATE  Past Medical History:  Diagnosis Date  . Abdominal pain   . Anxiety   . Arthritis   . Asthma   . Breast cancer ([place]) 06/2013   left upper inner  . Cancer ([place])    breast  . Diabetes mellitus without complication ([place])    Takes Tradjenta  . Family history of adverse reaction to anesthesia    Patients mother had to be resusciated after having anesthesia  . GERD (gastroesophageal reflux disease)   . Headache(784.0)    Takes Depakote  . Hernia, incisional periumbilical, incarcerated 10/30/8117  . Hot flashes   . Hx of radiation therapy 09/09/13- 10/13/13   left breast 5000 cGy in 25 sessions, no boost  . Hyperlipidemia   . Hypertension    PCP DR Nolon Rod  . Hypothyroidism   . Non-alcoholic fatty liver disease   . Peripheral vascular disease ([place])    pulmonary embolis-still have some  . Post concussion syndrome    4 yrs ago- sees [facility]- Dr Leonie Man every 6 months-   has sleep study 2011 following accident but was neg per patient-  short term memory issues, dates and times  . Pulmonary embolism ([place]) 2013  . Recurrent upper respiratory infection (URI)    FLU 08/23/11-08/29/11 then URI 08/29/11- 09/08/11- S/P zpack and steroids-  improved      no cough or congestion  . Thyroid disease   . Thyroid mass, left inferior lobe, 3.6cm [id number] 10/07/2011  . Ventral hernia     Surgeries: Procedure(s): c5-6, c6-7 ANTERIOR CERVICAL DECOMPRESSION/DISCECTOMY FUSION, ALLOGRAFT AND PLATE on 01/11/3085   Consultants:   Discharged Condition: Improved  Hospital Course: Alyssa [name] is an 69 y.o. female who was admitted 01/16/2020 for operative treatment of cervical  stenosis/HNP. Patient failed conservative treatments (please see the history and physical for the specifics) and had severe unremitting pain that affects sleep, daily activities and work/hobbies. After pre-op clearance, the patient was taken to the operating room on 01/16/2020 and underwent  Procedure(s): c5-6, c6-7 ANTERIOR CERVICAL DECOMPRESSION/DISCECTOMY FUSION, ALLOGRAFT AND PLATE.    Patient was given perioperative antibiotics:  Anti-infectives (From admission, onward)   Start     Dose/Rate Route Frequency Ordered Stop   01/16/20 0630  vancomycin (VANCOCIN) IVPB 1000 mg/200 mL premix        1,000 mg 200 mL/hr over 60 Minutes Intravenous On call to O.R. 01/16/20 5784 01/16/20 6962       Patient was given sequential compression devices and early ambulation to prevent DVT.   Patient benefited maximally from hospital stay improvement in her pain. . At the time of discharge, the patient was urinating/moving their bowels without difficulty, tolerating a regular diet, pain is controlled with oral pain medications and they have been cleared by PT/OT.   Recent vital signs: No data found.   Recent laboratory studies: No results for input(s): WBC, HGB, HCT, PLT, NA, K, CL, CO2, BUN, CREATININE, GLUCOSE, INR, CALCIUM in the last 72 hours.  Invalid input(s): PT, 2   Discharge Medications:   Allergies as of 01/17/2020      Reactions   Aleve [naproxen Sodium] Hives, Itching, Swelling   Aromasin [exemestane] Anaphylaxis, Itching, Swelling   Pt currently tolerating medication well (02/03/16)-gwd; Pt stated "I  can take this medication for a few months at a time and I take a benadryl when I feel it starting to come on" Patient has facial swelling, itching, and throat swelling  07/12/17  Taking this right now and no problem. sy/rn   Letrozole Shortness Of Breath, Rash, Other (See Comments)   Swelling - feet, eyes,hands;   Speech garbled   Levofloxacin Rash, Swelling   Facial swelling and red facial  rash   Penicillins Hives, Swelling, Rash   All over the body. Has patient had a PCN reaction causing immediate rash, facial/tongue/throat swelling, SOB or lightheadedness with hypotension: Yes Has patient had a PCN reaction causing severe rash involving mucus membranes or skin necrosis: No Has patient had a PCN reaction that required hospitalization No Has patient had a PCN reaction occurring within the last 10 years: No If all of the above answers are "NO", then may proceed with Cephalosporin use.   Symbicort [budesonide-formoterol Fumarate] Anaphylaxis, Hives, Swelling, Rash   Codeine Other (See Comments)   hallucinations   Percocet [oxycodone-acetaminophen] Other (See Comments)   Causes syncope day after medication has been taken.   Vicodin [hydrocodone-acetaminophen] Other (See Comments)   Causes syncope day after medication has been taken.   Doxycycline Swelling   Must with  antihistamine   Ezetimibe Other (See Comments)   Myalgias,fatigue   Prednisone Other (See Comments)   Can take with antihistamine/ can take the shot without antihistamine   Cefdinir Rash, Swelling      Medication List    TAKE these medications   albuterol 108 (90 Base) MCG/ACT inhaler Commonly known as: VENTOLIN HFA Inhale 2 puffs into the lungs every 6 (six) hours as needed for wheezing or shortness of breath.   ALPRAZolam 0.5 MG tablet Commonly known as: XANAX Take 0.5 mg by mouth at bedtime as needed for sleep.   amphetamine-dextroamphetamine 20 MG tablet Commonly known as: ADDERALL Take 20 mg by mouth See admin instructions. Take 105m in the morning and 254min the afternoon.   divalproex 500 MG 24 hr tablet Commonly known as: DEPAKOTE ER Take 1 tablet (500 mg total) by mouth 2 (two) times daily. What changed: when to take this   fexofenadine 60 MG tablet Commonly known as: ALLEGRA Take 60 mg by mouth daily.   hydrochlorothiazide 25 MG tablet Commonly known as: HYDRODIURIL Take 25 mg by  mouth at bedtime.   HYDROcodone-acetaminophen 5-325 MG tablet Commonly known as: Norco Take 1-2 tablets by mouth every 6 (six) hours as needed for moderate pain.   omeprazole 40 MG capsule Commonly known as: PRILOSEC TAKE 1 CAPSULE BY MOUTH DAILY   oxybutynin 10 MG 24 hr tablet Commonly known as: DITROPAN-XL Take 10 mg by mouth daily.   Repatha SureClick 14476G/ML [place] Generic drug: Evolocumab Inject 1 Dose into the skin every 14 (fourteen) days. What changed: how much to take       Diagnostic Studies: DG Chest 2 View  Result Date: 01/15/2020 CLINICAL DATA:  Preop evaluation for upcoming cervical surgery EXAM: CHEST - 2 VIEW COMPARISON:  03/27/2017 FINDINGS: Cardiac shadow is within normal limits. Aortic calcifications are again seen. The lungs are clear bilaterally. Postsurgical changes in the left breast are noted. Mild degenerative changes of the thoracic spine are seen. IMPRESSION: No active cardiopulmonary disease. Electronically Signed   By: MaInez Catalina.D.   On: 01/15/2020 09:41   DG Cervical Spine 2-3 Views  Result Date: 01/16/2020 CLINICAL DATA:  Surgical anterior fusion. EXAM: CERVICAL  SPINE - 2-3 VIEW; DG C-ARM 1-60 MIN Radiation exposure index: 7.27 mGy. COMPARISON:  November 19, 2019. FINDINGS: Three intraoperative fluoroscopic images were obtained of the cervical spine. These images demonstrate the patient be status post surgical anterior fusion of C5-6 and C6-7. Good alignment of vertebral bodies is noted. IMPRESSION: Status post surgical anterior fusion of C5-6 and C6-7. Electronically Signed   By: Marijo Conception M.D.   On: 01/16/2020 12:03   DG C-Arm 1-60 Min  Result Date: 01/16/2020 CLINICAL DATA:  Surgical anterior fusion. EXAM: CERVICAL SPINE - 2-3 VIEW; DG C-ARM 1-60 MIN Radiation exposure index: 7.27 mGy. COMPARISON:  November 19, 2019. FINDINGS: Three intraoperative fluoroscopic images were obtained of the cervical spine. These images demonstrate the patient be  status post surgical anterior fusion of C5-6 and C6-7. Good alignment of vertebral bodies is noted. IMPRESSION: Status post surgical anterior fusion of C5-6 and C6-7. Electronically Signed   By: Marijo Conception M.D.   On: 01/16/2020 12:03    Discharge Instructions    Incentive spirometry RT   Complete by: As directed        Follow-up Information    Marybelle Killings, MD Follow up in 1 week(s).   Specialty: Orthopedic Surgery Contact information: [address] [place] [address] [phone]               Discharge Plan:  discharge to home  Disposition:     Signed: Benjiman Core  01/27/2020, 10:05 AM

## 2020-02-08 ENCOUNTER — Other Ambulatory Visit: Payer: Self-pay | Admitting: Pulmonary Disease

## 2020-02-10 NOTE — Telephone Encounter (Signed)
Patient called and notified that we are unable to refill her request for flovent. She has not been seen in our office since 2019. Message left for patient to return call to make appointment.

## 2020-02-23 ENCOUNTER — Encounter: Payer: Self-pay | Admitting: Adult Health

## 2020-02-23 ENCOUNTER — Ambulatory Visit: Payer: Medicare PPO | Admitting: Adult Health

## 2020-02-23 VITALS — BP 112/65 | HR 83 | Ht 62.0 in | Wt 208.0 lb

## 2020-02-23 DIAGNOSIS — F0781 Postconcussional syndrome: Secondary | ICD-10-CM | POA: Diagnosis not present

## 2020-02-23 DIAGNOSIS — Z79899 Other long term (current) drug therapy: Secondary | ICD-10-CM

## 2020-02-23 DIAGNOSIS — R519 Headache, unspecified: Secondary | ICD-10-CM

## 2020-02-23 DIAGNOSIS — R413 Other amnesia: Secondary | ICD-10-CM

## 2020-02-23 MED ORDER — DIVALPROEX SODIUM ER 500 MG PO TB24: 500.0000 mg | ORAL_TABLET | Freq: Every day | ORAL | 3 refills | Status: DC

## 2020-02-23 NOTE — Patient Instructions (Signed)
Your Plan:  Continue Depakote ER 500 mg nightly for headache management    Follow-up in 6 months or call earlier if needed      Thank you for coming to see [place] at [facility]. I hope we have been able to provide you high quality care today.  You may receive a patient satisfaction survey over the next few weeks. We would appreciate your feedback and comments so that we may continue to improve ourselves and the health of our patients.

## 2020-02-23 NOTE — Progress Notes (Signed)
[facility]  PATIENT: Alyssa [name] DOB: October 28, 1950   REASON FOR VISIT: Follow-up postconcussion syndrome HISTORY FROM: Patient   Chief complaint: Headaches   HISTORY OF PRESENT ILLNESS:   Today, 02/23/2020, Alyssa [name] returns for postconcussion syndrome with memory loss and headaches.  Continues on Depakote ER 500 mg nightly with benefit of headache management without side effects.  Memory loss has been stable without worsening.  Since prior visit, underwent uncomplicated cervical spinal fusion on 01/16/2020.  Also started on Adderall by PCP for possible ADHD and mood swings with great improvement.  No concerns at this time.     History provided for reference purposes only Update 08/26/2019 JM: Alyssa [name] is a 69 year old female who is being seen today for 1 year follow-up regarding postconcussion syndrome with memory loss and headaches.  At prior visit, she was doing well in regards to stable headaches and memory.  She endorses worsening headaches since the beginning of December which are generalized associated with photophobia and phonophobia with similar characteristics compared to her prior headaches.  She has been using Tylenol 1000 mg twice daily with only mild benefit.  She does endorse increased stress as she is a high school special ed teacher and had difficulty with the virtual learning as she felt "isolated".  She has now since returned to in class learning and has been feeling better since that time but she continues to experience short-term memory loss and is concerned regarding overall worsening memory. MMSE today 29/30.  She continues to use Depakote DR 500 mg nightly but previously on ER formulation.  She denies depression or anxiety and denies any history or family history.  She does endorse increased stress.  Recently started on Repatha by cardiology with excellent improvement of cholesterol levels.  Blood pressure today satisfactory at 132/84.  No further  concerns at this time.  UPDATE 1/2/2020[name], 69 year old female returns for follow-up with history of memory loss postconcussion syndrome and headaches.  She is currently on Depakote 500 mg at night with good control of her headaches however she does wake up with some morning headaches occasionally.  She occasionally takes Tylenol with relief.  She denies any daytime drowsiness or snoring.  She claims she feels the best she has in a long time she is exercising.  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  She denies any issues with her job performance.  Her mother recently died.  She returns for reevaluation  Update 12/20/2018[name], 69 year old female returns for follow-up with history of headaches and postconcussion syndrome and memory loss.  For her headaches she is taking Depakote but decided to taper herself off of the medication back in the summer, her headaches returned and she resumed the Depakote.  Her headaches are now in good control her memory loss is stable per MMSE.  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  She has not had no issues performing her job.  She returns for reevaluation and refills   UPDATE 07/19/16 Alyssa [name], 69 year old female returns for yearly followup.  Patient fell off of the chair at school back in June2015 and hit her head. CT of the head at that time without abnormalities her headaches returned and she started taking her Depakote again with good benefit. Her memory is stable per MMSE. 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  She  has not had any issues performing  her job. She had breast cancer surgery January 2015. She had knee replacement on the right 12/22/2015 She returns for reevaluation and refills   HISTORY: of cognitive changes post head injury. She was initially evaluated by Dr. Leonie Man in  September of 2009. She was operating a push mower when she was going down a ditch in front of her home and toppled over the handle and hit her head on the motor of the mower. At that time she was not sure she loss consciousness or not,she was able to get up go indoors. She did not seek medical help that day, however she did see her primary care the next day when she was complaining of severe headache, appeared to be confused and disoriented and did not recognize the Dr. CT of the head showed no acute abnormality. She has since had an MRI of the brain which was read as normal as well as an EEG that was normal. She returns today continuing to complain with short-term memory, attention, and concentration difficulties but she continues to work and she has not had any difficulty performing her job duties. She says she manages by using sticky notes, so that she will not forget things. She denies any depression ,she is trying to exercise and has lost weight since last seen. She does complain of early morning headaches and fatigue, does not feel rested after sleeping although that is better with Trazodone. She is currently on Depakote 500 daily 2 daily for headaches.Her headaches have returned since she is back to teaching after being off for the summer. She says these are stress related. She does not want to increase medication. There is no nausea, photophobia, with the headaches. 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 Fell several weeks ago and hit the back of the head. No obvious injury. See ROS  07/18/12: Returns for followup. MMSE stable. Went off Depakote for a few weeks and headaches returned and mood was terrible. Her PCP gave her a RX to get back on the Depakote. Headaches are stable at present. Continues to work with out problems with performing job. Had hernia repair in March and complication of PE after surgery. Has just gotten off coumadin. No new complaints   REVIEW OF SYSTEMS:  Full 14 system review of systems performed and notable only for those listed, all others are neg:  Constitutional: neg  Cardiovascular: neg Ear/Nose/Throat: neg  Skin: neg Eyes: neg Respiratory: neg Gastroitestinal: neg  Hematology/Lymphatic: neg  Endocrine: neg Musculoskeletal:neg Allergy/Immunology: neg Neurological: Memory loss, headaches Psychiatric: Stress Sleep : neg   ALLERGIES: Allergies  Allergen Reactions  . Aleve [Naproxen Sodium] Hives, Itching and Swelling  . Aromasin [Exemestane] Anaphylaxis, Itching and Swelling    Pt currently tolerating medication well (02/03/16)-gwd; Pt stated "I can take this medication for a few months at a time and I take a benadryl when I feel it starting to come on" Patient has facial swelling, itching, and throat swelling  07/12/17  Taking this right now and no problem. sy/rn  . Letrozole Shortness Of Breath, Rash and Other (See Comments)    Swelling - feet, eyes,hands;   Speech garbled  . Levofloxacin Rash and Swelling    Facial swelling and red facial rash  . Penicillins Hives, Swelling and Rash    All over the body. Has patient had a PCN reaction causing immediate rash, facial/tongue/throat swelling, SOB or lightheadedness with hypotension: Yes Has patient had a  PCN reaction causing severe rash involving mucus membranes or skin necrosis: No Has patient had a PCN reaction that required hospitalization No Has patient had a PCN reaction occurring within the last 10 years: No If all of the above answers are "NO", then may proceed with Cephalosporin use.   . Symbicort [Budesonide-Formoterol Fumarate] Anaphylaxis, Hives, Swelling and Rash  . Codeine Other (See Comments)    hallucinations  . Percocet [Oxycodone-Acetaminophen] Other (See Comments)    Causes syncope day after medication has been taken.  . Vicodin [Hydrocodone-Acetaminophen] Other (See Comments)    Causes syncope day after medication has been taken.  . Doxycycline Swelling     Must with  antihistamine  . Ezetimibe Other (See Comments)    Myalgias,fatigue  . Prednisone Other (See Comments)    Can take with antihistamine/ can take the shot without antihistamine  . Cefdinir Rash and Swelling    HOME MEDICATIONS: Outpatient Medications Prior to Visit  Medication Sig Dispense Refill  . albuterol (VENTOLIN HFA) 108 (90 Base) MCG/ACT inhaler Inhale 2 puffs into the lungs every 6 (six) hours as needed for wheezing or shortness of breath.     . ALPRAZolam (XANAX) 0.5 MG tablet Take 0.5 mg by mouth at bedtime as needed for sleep.     Marland Kitchen amphetamine-dextroamphetamine (ADDERALL) 20 MG tablet Take 20 mg by mouth See admin instructions. Take 84m in the morning and 236min the afternoon.    . Evolocumab (REPATHA SURECLICK) 14706G/ML SOAJ Inject 1 Dose into the skin every 14 (fourteen) days. (Patient taking differently: Inject 140 mg into the skin every 14 (fourteen) days. ) 2 pen 11  . fexofenadine (ALLEGRA) 60 MG tablet Take 60 mg by mouth daily.     . hydrochlorothiazide (HYDRODIURIL) 25 MG tablet Take 25 mg by mouth at bedtime.     . Marland Kitchenmeprazole (PRILOSEC) 40 MG capsule TAKE 1 CAPSULE BY MOUTH DAILY (Patient taking differently: Take 40 mg by mouth daily. ) 30 capsule 0  . oxybutynin (DITROPAN-XL) 10 MG 24 hr tablet Take 10 mg by mouth daily.   0  . divalproex (DEPAKOTE ER) 500 MG 24 hr tablet Take 1 tablet (500 mg total) by mouth 2 (two) times daily. (Patient taking differently: Take 500 mg by mouth at bedtime. ) 60 tablet 3  . HYDROcodone-acetaminophen (NORCO) 5-325 MG tablet Take 1-2 tablets by mouth every 6 (six) hours as needed for moderate pain. 40 tablet 0   Facility-Administered Medications Prior to Visit  Medication Dose Route Frequency Provider Last Rate Last Admin  . fluticasone (FLOVENT HFA) 44 MCG/ACT inhaler 2 puff  2 puff Inhalation BID SoChesley MiresMD        PAST MEDICAL HISTORY: Past Medical History:  Diagnosis Date  . Abdominal pain   . Anxiety   .  Arthritis   . Asthma   . Breast [facility] ([facility]   left upper inner  . Cancer ([facility]   breast  . [facility] [place] without [place] ([facility]   Takes Tradjenta  . Family history of adverse reaction to anesthesia    Patients mother had to be resusciated after having anesthesia  . GERD (gastroesophageal reflux disease)   . Headache(784.0)    Takes Depakote  . Hernia, incisional periumbilical, incarcerated 07/26/35/6283. Hot flashes   . Hx of radiation therapy 09/09/13- 10/13/13   left breast 5000 cGy in 25 sessions, no boost  . Hyperlipidemia   . Hypertension    PCP DR StNolon Rod.  Hypothyroidism   . Non-alcoholic fatty liver disease   . Peripheral vascular disease ([place])    pulmonary embolis-still have some  . Post concussion syndrome    4 yrs ago- sees [facility]- Dr Leonie Man every 6 months-   has sleep study 2011 following accident but was neg per patient-  short term memory issues, dates and times  . Pulmonary embolism ([place]) 2013  . Recurrent upper respiratory infection (URI)    FLU 08/23/11-08/29/11 then URI 08/29/11- 09/08/11- S/P zpack and steroids-  improved      no cough or congestion  . Thyroid disease   . 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 Ventral hernia     PAST SURGICAL HISTORY: Past Surgical History:  Procedure Laterality Date  . ANTERIOR CERVICAL DECOMP/DISCECTOMY FUSION N/A 01/16/2020   Procedure: c5-6, c6-7 ANTERIOR CERVICAL DECOMPRESSION/DISCECTOMY FUSION, ALLOGRAFT AND PLATE;  Surgeon: Marybelle Killings, MD;  Location: [place];  Service: Orthopedics;  Laterality: N/A;  . APPENDECTOMY  2002   lap appy.  Dr. Hassell Done  . BREAST BIOPSY    . BREAST LUMPECTOMY Left 2015   radiation  . BREAST LUMPECTOMY WITH NEEDLE LOCALIZATION AND AXILLARY SENTINEL LYMPH NODE BX Left 08/04/2013   Procedure: BREAST LUMPECTOMY WITH NEEDLE LOCALIZATION AND AXILLARY SENTINEL LYMPH NODE Biopsy x 3;  Surgeon: Rolm Bookbinder, MD;  Location: [place];  Service: General;   Laterality: Left;  . BREAST SURGERY    . CARPAL TUNNEL RELEASE  10/2009-12/2010   left - right  . CESAREAN SECTION  X 2  . COLONOSCOPY    . DIAGNOSTIC LAPAROSCOPY     Lap. chole., appendectomy, hernia repair  . HERNIA REPAIR  09/15/11   Ventral w/mesh  . JOINT REPLACEMENT    . KNEE ARTHROPLASTY Right 12/22/2015   Procedure: COMPUTER ASSISTED TOTAL KNEE ARTHROPLASTY;  Surgeon: Marybelle Killings, MD;  Location: [place];  Service: Orthopedics;  Laterality: Right;  . LAPAROSCOPIC CHOLECYSTECTOMY  2006   Dr. Bubba Camp  . TARSAL TUNNEL RELEASE Bilateral   . THYROIDECTOMY, PARTIAL    . VENTRAL HERNIA REPAIR  09/29/2011   Procedure: LAPAROSCOPIC VENTRAL HERNIA;  Surgeon: Adin Hector, MD;  [facility];  Service: General;  Laterality: N/A;  Laparoscopic Ventral Wall Hernia Repair with Mesh    FAMILY HISTORY: Family History  Problem Relation Age of Onset  . Malignant hyperthermia Mother   . Heart attack Father   . Stroke Father   . Diabetes Other        Grandmother  . Other Other        respiratory- Grandmother  . Cancer Maternal Grandmother 50       breast  . Breast cancer Maternal Grandmother     SOCIAL HISTORY: Social History   Socioeconomic History  . Marital status: Divorced    Spouse name: Not on file  . Number of children: 2  . Years of education: Master's  . Highest education level: Not on file  Occupational History    Employer: [employer]  Tobacco Use  . Smoking status: Never Smoker  . Smokeless tobacco: Never Used  Vaping Use  . Vaping Use: Never used  Substance and Sexual Activity  . Alcohol use: No    Alcohol/week: 1.0 standard drink    Types: 1 Glasses of wine per week  . Drug use: No  . Sexual activity: Yes    Comment: Menarche age 60, first live birth age 12,  [place] P2. She stopped having periods  approximately 1999 and took HRT until 2005.  Other Topics Concern  . Not on file  Social History Narrative   Patient is divorced and lives alone.   Patient  has two children.   Patient is a Pharmacist, hospital in [employer].   Patient has a Master's degree.   Patient drinks 3-4 cups of caffeine daily.    Patient is right handed.   Social Determinants of Health   Financial Resource Strain:   . Difficulty of Paying Living Expenses:   Food Insecurity:   . Worried About Charity fundraiser in the Last Year:   . Arboriculturist in the Last Year:   Transportation Needs:   . Film/video editor (Medical):   Marland Kitchen Lack of Transportation (Non-Medical):   Physical Activity:   . Days of Exercise per Week:   . Minutes of Exercise per Session:   Stress:   . Feeling of Stress :   Social Connections:   . Frequency of Communication with Friends and Family:   . Frequency of Social Gatherings with Friends and Family:   . Attends Religious Services:   . Active Member of Clubs or Organizations:   . Attends Archivist Meetings:   Marland Kitchen Marital Status:   Intimate Partner Violence:   . Fear of Current or Ex-Partner:   . Emotionally Abused:   Marland Kitchen Physically Abused:   . Sexually Abused:      PHYSICAL EXAM  Vitals:   02/23/20 1424  BP: 112/65  Pulse: 83  Weight: 208 lb (94.3 kg)  Height: 5' 2"  (1.575 m)   Body mass index is 38.04 kg/m.  Generalized: Well developed,pleasant pleasant middle-aged female in no acute distress  Head: normocephalic and atraumatic,. Oropharynx benign  Neck: Cervical collar in place s/p cervical fusion Musculoskeletal: No deformity   Neurological examination   Mentation: Awake and alert.  Follows all commands.  Speech and language fluent Cranial Nerves: Pupils equal, briskly reactive to light. Extraocular movements full without nystagmus. Visual fields full to confrontation. Hearing intact. Facial sensation intact. Face, tongue, palate moves normally and symmetrically.  Motor: Normal bulk and tone. Normal strength in all tested extremity muscles. Sensory.: intact to touch , pinprick , position and  vibratory sensation.  Coordination: Rapid alternating movements normal in all extremities. Finger-to-nose and heel-to-shin performed accurately bilaterally. Gait and Station: Arises from chair without difficulty. Stance is normal. Gait demonstrates normal stride length and balance Reflexes: 1+ and symmetric. Toes downgoing.     DIAGNOSTIC DATA (LABS, IMAGING, TESTING) - I reviewed patient records, labs, notes, testing and imaging myself where available.  Lab Results  Component Value Date   WBC 5.1 01/14/2020   HGB 14.4 01/14/2020   HCT 45.8 01/14/2020   MCV 88.9 01/14/2020   PLT 280 01/14/2020      Component Value Date/Time   NA 141 01/14/2020 0840   NA 140 07/27/2016 1450   K 3.4 (L) 01/14/2020 0840   K 3.4 (L) 07/27/2016 1450   CL 99 01/14/2020 0840   CO2 28 01/14/2020 0840   CO2 29 07/27/2016 1450   GLUCOSE 106 (H) 01/14/2020 0840   GLUCOSE 85 07/27/2016 1450   BUN 16 01/14/2020 0840   BUN 15.4 07/27/2016 1450   CREATININE 0.78 01/14/2020 0840   CREATININE 0.8 07/27/2016 1450   CALCIUM 9.7 01/14/2020 0840   CALCIUM 9.8 07/27/2016 1450   PROT 7.0 01/14/2020 0840   PROT 6.6 12/31/2018 1040   PROT 7.4 07/27/2016 1450  ALBUMIN 4.0 01/14/2020 0840   ALBUMIN 4.6 12/31/2018 1040   ALBUMIN 4.1 07/27/2016 1450   AST 48 (H) 01/14/2020 0840   AST 21 07/27/2016 1450   ALT 74 (H) 01/14/2020 0840   ALT 27 07/27/2016 1450   ALKPHOS 44 01/14/2020 0840   ALKPHOS 59 07/27/2016 1450   BILITOT 0.9 01/14/2020 0840   BILITOT 0.6 12/31/2018 1040   BILITOT 0.75 07/27/2016 1450   GFRNONAA >60 01/14/2020 0840   GFRAA >60 01/14/2020 0840   Lipid Panel     Component Value Date/Time   CHOL 151 08/11/2019 1026   TRIG 142 08/11/2019 1026   HDL 51 08/11/2019 1026   CHOLHDL 3.0 08/11/2019 1026   LDLCALC 75 08/11/2019 1026   LABVLDL 25 08/11/2019 1026        ASSESSMENT AND PLAN  68 y.o. year old female  has a past medical history of Hyperlipidemia; Post concussion syndrome  with memory loss and headache and HTN.     Postconcussion syndrome -c/o Memory loss and headache -doing very well without worsening symptoms -Continue Depakote ER 500 mg nightly for headache prophylaxis -We will check valproic acid level today.  CBC and CMP recently obtained which was satisfactory -Memory loss stable with prior MMSE 29/30    Follow-up in 6 months or call earlier if needed    I spent 20 minutes of face-to-face and non-face-to-face time with patient.  This included previsit chart review, lab review, study review, order entry, electronic health record documentation, patient education and therapeutic management regarding below diagnosis:  Post concussion syndrome - Plan: Valproic Acid Level, divalproex (DEPAKOTE ER) 500 MG 24 hr tablet  Encounter for medication management - Plan: Valproic Acid Level, divalproex (DEPAKOTE ER) 500 MG 24 hr tablet   Frann Rider, AGNP-BC  [facility] [address] [place], [place] [address]  Phone [phone] Fax [phone] Note: This document was prepared with digital dictation and possible smart phrase technology. Any transcriptional errors that result from this process are unintentional.

## 2020-02-23 NOTE — Progress Notes (Signed)
I agree with the above plan 

## 2020-02-24 ENCOUNTER — Ambulatory Visit (INDEPENDENT_AMBULATORY_CARE_PROVIDER_SITE_OTHER): Payer: Medicare PPO

## 2020-02-24 ENCOUNTER — Ambulatory Visit (INDEPENDENT_AMBULATORY_CARE_PROVIDER_SITE_OTHER): Payer: Medicare PPO | Admitting: Orthopaedic Surgery

## 2020-02-24 VITALS — BP 153/84 | HR 74 | Ht 62.0 in | Wt 208.0 lb

## 2020-02-24 DIAGNOSIS — Z981 Arthrodesis status: Secondary | ICD-10-CM

## 2020-02-24 LAB — VALPROIC ACID LEVEL: Valproic Acid Lvl: 24 ug/mL — ABNORMAL LOW (ref 50–100)

## 2020-02-24 NOTE — Progress Notes (Signed)
Post-Op Visit Note   Patient: Alyssa [name]           Date of Birth: Sep 18, 1950           MRN: [id number] Visit Date: 02/24/2020 PCP: Nickola Major, MD   Assessment & Plan:  Chief Complaint:  Chief Complaint  Patient presents with   Neck - Routine Post Op   Visit Diagnoses:  1. Status post cervical spinal fusion     Plan: Patient has partial incision looks good she is happy the surgical result and will return if she has any problems.  Follow-Up Instructions: No follow-ups on file.   Orders:  Orders Placed This Encounter  Procedures   XR Cervical Spine 2 or 3 views   No orders of the defined types were placed in this encounter.   Imaging: No results found.  PMFS History: Patient Active Problem List   Diagnosis Date Noted   Spondylosis of cervical region without myelopathy or radiculopathy 01/16/2020   Other spondylosis with radiculopathy, cervical region 06/13/2018   GERD (gastroesophageal reflux disease) 04/05/2018   Allergic rhinitis 04/05/2018   Upper airway cough syndrome 04/05/2018   Patellar tendinitis, right knee 03/01/2017   Status post total right knee replacement 12/22/2015   Hepatic steatosis 02/09/2015   Hot flashes 09/09/2014   Osteopenia 09/09/2014   Malignant neoplasm of upper-inner quadrant of left breast in female, estrogen receptor positive ([place]) 07/23/2013   Memory loss 07/14/2013   Headache 07/14/2013   Other malaise and fatigue 07/14/2013   Acute pulmonary embolism ([place]) 10/07/2011   Leukocytosis 10/07/2011   Thyroid mass, left inferior lobe, 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 Mar2013 10/07/2011   Hyponatremia 10/07/2011   Obesity (BMI 30-39.9) 08/07/2011   Post concussion syndrome    Past Medical History:  Diagnosis Date   Abdominal pain    Anxiety    Arthritis    Asthma    Breast cancer ([place]) 06/2013   left upper inner   Cancer ([place])    breast   Diabetes mellitus without complication ([place])    Takes Tradjenta   Family  history of adverse reaction to anesthesia    Patients mother had to be resusciated after having anesthesia   GERD (gastroesophageal reflux disease)    Headache(784.0)    Takes Depakote   Hernia, incisional periumbilical, incarcerated 11/04/2438   Hot flashes    Hx of radiation therapy 09/09/13- 10/13/13   left breast 5000 cGy in 25 sessions, no boost   Hyperlipidemia    Hypertension    PCP DR Nolon Rod   Hypothyroidism    Non-alcoholic fatty liver disease    Peripheral vascular disease ([place])    pulmonary embolis-still have some   Post concussion syndrome    4 yrs ago- sees [facility]- Dr Leonie Man every 6 months-   has sleep study 2011 following accident but was neg per patient-  short term memory issues, dates and times   Pulmonary embolism ([place]) 2013   Recurrent upper respiratory infection (URI)    FLU 08/23/11-08/29/11 then URI 08/29/11- 09/08/11- S/P zpack and steroids-  improved      no cough or congestion   Thyroid disease    Thyroid mass, left inferior lobe, 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 [id number] 10/07/2011   Ventral hernia     Family History  Problem Relation Age of Onset   Malignant hyperthermia Mother    Heart attack Father    Stroke Father    Diabetes Other        Grandmother   Other Other  respiratory- Grandmother   Cancer Maternal Grandmother 57       breast   Breast cancer Maternal Grandmother     Past Surgical History:  Procedure Laterality Date   ANTERIOR CERVICAL DECOMP/DISCECTOMY FUSION N/A 01/16/2020   Procedure: c5-6, c6-7 ANTERIOR CERVICAL DECOMPRESSION/DISCECTOMY FUSION, ALLOGRAFT AND PLATE;  Surgeon: Marybelle Killings, MD;  Location: [place];  Service: Orthopedics;  Laterality: N/A;   APPENDECTOMY  2002   lap appy.  Dr. Hassell Done   BREAST BIOPSY     BREAST LUMPECTOMY Left 2015   radiation   BREAST LUMPECTOMY WITH NEEDLE LOCALIZATION AND AXILLARY SENTINEL LYMPH NODE BX Left 08/04/2013   Procedure: BREAST LUMPECTOMY WITH NEEDLE LOCALIZATION AND AXILLARY  SENTINEL LYMPH NODE Biopsy x 3;  Surgeon: Rolm Bookbinder, MD;  Location: [place];  Service: General;  Laterality: Left;   BREAST SURGERY     CARPAL TUNNEL RELEASE  10/2009-12/2010   left - right   CESAREAN SECTION  X 2   COLONOSCOPY     DIAGNOSTIC LAPAROSCOPY     Lap. chole., appendectomy, hernia repair   HERNIA REPAIR  09/15/11   Ventral w/mesh   JOINT REPLACEMENT     KNEE ARTHROPLASTY Right 12/22/2015   Procedure: COMPUTER ASSISTED TOTAL KNEE ARTHROPLASTY;  Surgeon: Marybelle Killings, MD;  Location: [place];  Service: Orthopedics;  Laterality: Right;   LAPAROSCOPIC CHOLECYSTECTOMY  2006   Dr. Gean Maidens TUNNEL RELEASE Bilateral    THYROIDECTOMY, PARTIAL     VENTRAL HERNIA REPAIR  09/29/2011   Procedure: LAPAROSCOPIC VENTRAL HERNIA;  Surgeon: Adin Hector, MD;  [facility];  Service: General;  Laterality: N/A;  Laparoscopic Ventral Wall Hernia Repair with Mesh   Social History   Occupational History    Employer: [employer]  Tobacco Use   Smoking status: Never Smoker   Smokeless tobacco: Never Used  Vaping Use   Vaping Use: Never used  Substance and Sexual Activity   Alcohol use: No    Alcohol/week: 1.0 standard drink    Types: 1 Glasses of wine per week   Drug use: No   Sexual activity: Yes    Comment: Menarche age 17, first live birth age 68,  [place] P2. She stopped having periods approximately 1999 and took HRT until 2005.

## 2020-07-05 ENCOUNTER — Telehealth: Payer: Self-pay | Admitting: Internal Medicine

## 2020-07-05 NOTE — Telephone Encounter (Signed)
PA Case: [id number], Status: Approved, Coverage Starts on: 07/25/2019 12:00:00 AM, Coverage Ends on: 07/23/2021 12:00:00 AM

## 2020-07-05 NOTE — Telephone Encounter (Signed)
PA submitted via CMM  (Key: [id number]) - [id number]

## 2020-07-21 ENCOUNTER — Other Ambulatory Visit: Payer: Self-pay | Admitting: Internal Medicine

## 2020-07-22 ENCOUNTER — Other Ambulatory Visit: Payer: Self-pay | Admitting: Internal Medicine

## 2020-07-22 DIAGNOSIS — E7849 Other hyperlipidemia: Secondary | ICD-10-CM

## 2020-08-12 ENCOUNTER — Other Ambulatory Visit: Payer: Self-pay | Admitting: Oncology

## 2020-08-12 DIAGNOSIS — Z1231 Encounter for screening mammogram for malignant neoplasm of breast: Secondary | ICD-10-CM

## 2020-08-13 ENCOUNTER — Other Ambulatory Visit: Payer: Self-pay | Admitting: Family Medicine

## 2020-08-13 DIAGNOSIS — K760 Fatty (change of) liver, not elsewhere classified: Secondary | ICD-10-CM

## 2020-08-18 ENCOUNTER — Other Ambulatory Visit: Payer: Self-pay | Admitting: Family Medicine

## 2020-08-18 ENCOUNTER — Other Ambulatory Visit: Payer: Self-pay | Admitting: Internal Medicine

## 2020-08-18 DIAGNOSIS — E785 Hyperlipidemia, unspecified: Secondary | ICD-10-CM

## 2020-08-18 DIAGNOSIS — M8580 Other specified disorders of bone density and structure, unspecified site: Secondary | ICD-10-CM

## 2020-08-25 ENCOUNTER — Ambulatory Visit: Payer: Medicare PPO | Admitting: Adult Health

## 2020-08-25 ENCOUNTER — Encounter: Payer: Self-pay | Admitting: Adult Health

## 2020-08-25 VITALS — BP 168/92 | HR 74 | Ht 62.0 in | Wt 213.0 lb

## 2020-08-25 DIAGNOSIS — R519 Headache, unspecified: Secondary | ICD-10-CM | POA: Diagnosis not present

## 2020-08-25 DIAGNOSIS — F0781 Postconcussional syndrome: Secondary | ICD-10-CM

## 2020-08-25 MED ORDER — METOPROLOL TARTRATE 25 MG PO TABS: 25.0000 mg | ORAL_TABLET | Freq: Two times a day (BID) | ORAL | 3 refills | Status: DC

## 2020-08-25 NOTE — Patient Instructions (Addendum)
Your Plan:  Continue Depakote ER 500 mg nightly for headache prophylaxis  Initiate metoprolol 25 mg twice daily for further headache prevention -if headaches continue after 1 week, please let me know for further dosage increase.  Please ensure you monitor your heart rate and blood pressure at home as this medication can lower both blood pressure and heart rate  Highly consider pursuing repeat sleep evaluation as you have known insomnia, currently having morning headaches and have difficulty controlling your blood pressure which are all signs of sleep apnea -untreated sleep apnea increases your risk of heart disease and stroke.  Please let me know if you would like to proceed with further evaluation    Follow-up in 6 months or call earlier if needed     Thank you for coming to see [place] at [facility]. I hope we have been able to provide you high quality care today.  You may receive a patient satisfaction survey over the next few weeks. We would appreciate your feedback and comments so that we may continue to improve ourselves and the health of our patients.     Sleep Apnea Sleep apnea is a condition in which breathing pauses or becomes shallow during sleep. Episodes of sleep apnea usually last 10 seconds or longer, and they may occur as many as 20 times an hour. Sleep apnea disrupts your sleep and keeps your body from getting the rest that it needs. This condition can increase your risk of certain health problems, including:  Heart attack.  Stroke.  Obesity.  Diabetes.  Heart failure.  Irregular heartbeat. What are the causes? There are three kinds of sleep apnea:  Obstructive sleep apnea. This kind is caused by a blocked or collapsed airway.  Central sleep apnea. This kind happens when the part of the brain that controls breathing does not send the correct signals to the muscles that control breathing.  Mixed sleep apnea. This is a combination of  obstructive and central sleep apnea. The most common cause of this condition is a collapsed or blocked airway. An airway can collapse or become blocked if:  Your throat muscles are abnormally relaxed.  Your tongue and tonsils are larger than normal.  You are overweight.  Your airway is smaller than normal.   What increases the risk? You are more likely to develop this condition if you:  Are overweight.  Smoke.  Have a smaller than normal airway.  Are elderly.  Are female.  Drink alcohol.  Take sedatives or tranquilizers.  Have a family history of sleep apnea. What are the signs or symptoms? Symptoms of this condition include:  Trouble staying asleep.  Daytime sleepiness and tiredness.  Irritability.  Loud snoring.  Morning headaches.  Trouble concentrating.  Forgetfulness.  Decreased interest in sex.  Unexplained sleepiness.  Mood swings.  Personality changes.  Feelings of depression.  Waking up often during the night to urinate.  Dry mouth.  Sore throat. How is this diagnosed? This condition may be diagnosed with:  A medical history.  A physical exam.  A series of tests that are done while you are sleeping (sleep study). These tests are usually done in a sleep lab, but they may also be done at home. How is this treated? Treatment for this condition aims to restore normal breathing and to ease symptoms during sleep. It may involve managing health issues that can affect breathing, such as high blood pressure or obesity. Treatment may include:  Sleeping on your side.  Using  a decongestant if you have nasal congestion.  Avoiding the use of depressants, including alcohol, sedatives, and narcotics.  Losing weight if you are overweight.  Making changes to your diet.  Quitting smoking.  Using a device to open your airway while you sleep, such as: ? An oral appliance. This is a custom-made mouthpiece that shifts your lower jaw forward. ? A  continuous positive airway pressure (CPAP) device. This device blows air through a mask when you breathe out (exhale). ? A nasal expiratory positive airway pressure (EPAP) device. This device has valves that you put into each nostril. ? A bi-level positive airway pressure (BPAP) device. This device blows air through a mask when you breathe in (inhale) and breathe out (exhale).  Having surgery if other treatments do not work. During surgery, excess tissue is removed to create a wider airway. It is important to get treatment for sleep apnea. Without treatment, this condition can lead to:  High blood pressure.  Coronary artery disease.  In men, an inability to achieve or maintain an erection (impotence).  Reduced thinking abilities.   Follow these instructions at home: Lifestyle  Make any lifestyle changes that your health care provider recommends.  Eat a healthy, well-balanced diet.  Take steps to lose weight if you are overweight.  Avoid using depressants, including alcohol, sedatives, and narcotics.  Do not use any products that contain nicotine or tobacco, such as cigarettes, e-cigarettes, and chewing tobacco. If you need help quitting, ask your health care provider. General instructions  Take over-the-counter and prescription medicines only as told by your health care provider.  If you were given a device to open your airway while you sleep, use it only as told by your health care provider.  If you are having surgery, make sure to tell your health care provider you have sleep apnea. You may need to bring your device with you.  Keep all follow-up visits as told by your health care provider. This is important. Contact a health care provider if:  The device that you received to open your airway during sleep is uncomfortable or does not seem to be working.  Your symptoms do not improve.  Your symptoms get worse. Get help right away if:  You develop: ? Chest  pain. ? Shortness of breath. ? Discomfort in your back, arms, or stomach.  You have: ? Trouble speaking. ? Weakness on one side of your body. ? Drooping in your face. These symptoms may represent a serious problem that is an emergency. Do not wait to see if the symptoms will go away. Get medical help right away. Call your local emergency services (911 in the [place] Do not drive yourself to the hospital. Summary  Sleep apnea is a condition in which breathing pauses or becomes shallow during sleep.  The most common cause is a collapsed or blocked airway.  The goal of treatment is to restore normal breathing and to ease symptoms during sleep. This information is not intended to replace advice given to you by your health care provider. Make sure you discuss any questions you have with your health care provider. Document Revised: 12/25/2018 Document Reviewed: 03/05/2018 [employer] Patient Education  2021 [employer].

## 2020-08-25 NOTE — Progress Notes (Signed)
[facility]  PATIENT: Alyssa [name] DOB: 27-Dec-1950   REASON FOR VISIT: Follow-up postconcussion syndrome HISTORY FROM: Patient   Chief complaint: Headaches   HISTORY OF PRESENT ILLNESS:   Today, 08/25/2020, Alyssa [name] returns for 78-monthfollow-up with history of postconcussion syndrome.  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 She has remained on depakote ER 5066mnightly but reports since November she has been experiencing occasional headaches upon awakening. She will take tylenol with occasional benefit.  She also reports insomnia.  Reports previously undergoing sleep study approximately 10 years ago and was negative for sleep apnea.  She denies snoring or any witnessed apnea.  She has been having difficulty with blood pressure control and PCP recently switched hydrochlorothiazide to losartan.  Blood pressure today 168/92 -occasionally monitors at home.  PCP is also working her up for elevated liver enzymes and has scheduled ultrasound 2/7.  No further concerns at this time.    History provided for reference purposes only Update 02/23/2020 JM: Ms. WiTeodoroeturns for postconcussion syndrome with memory loss and headaches.  Continues on Depakote ER 500 mg nightly with benefit of headache management without side effects.  Memory loss has been stable without worsening.  Since prior visit, underwent uncomplicated cervical spinal fusion on 01/16/2020.  Also started on Adderall by PCP for possible ADHD and mood swings with great improvement.  No concerns at this time.  Update 08/26/2019 JM: Alyssa [name] a [age]ear old female who is being seen today for 1 year follow-up regarding postconcussion syndrome with memory loss and headaches.  At prior visit, she was doing well in regards to stable headaches and memory.  She endorses worsening headaches since the beginning of December which are generalized associated with photophobia and  phonophobia with similar characteristics compared to her prior headaches.  She has been using Tylenol 1000 mg twice daily with only mild benefit.  She does endorse increased stress as she is a high school special ed teacher and had difficulty with the virtual learning as she felt "isolated".  She has now since returned to in class learning and has been feeling better since that time but she continues to experience short-term memory loss and is concerned regarding overall worsening memory. MMSE today 29/30.  She continues to use Depakote DR 500 mg nightly but previously on ER formulation.  She denies depression or anxiety and denies any history or family history.  She does endorse increased stress.  Recently started on Repatha by cardiology with excellent improvement of cholesterol levels.  Blood pressure today satisfactory at 132/84.  No further concerns at this time.  UPDATE 1/2/2020CM [name] old female returns for follow-up with history of memory loss postconcussion syndrome and headaches.  She is currently on Depakote 500 mg at night with good control of her headaches however she does wake up with some morning headaches occasionally.  She occasionally takes Tylenol with relief.  She denies any daytime drowsiness or snoring.  She claims she feels the best she has in a long time she is exercising.  She continues to work she teaches in [employer].  She denies any issues with her job performance.  Her mother recently died.  She returns for reevaluation  Update 12/20/2018CM [name] old female returns for follow-up with history of headaches and postconcussion syndrome and memory loss.  For her headaches she is taking Depakote but decided to taper herself off of the medication back in the  summer, her headaches returned and she resumed the Depakote.  Her headaches are now in good control her memory loss is stable per MMSE.  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  She has not had no issues performing her job.  She returns for reevaluation and refills   UPDATE 07/19/16 [name], 70 year old female returns for yearly followup.  Patient fell off of the chair at school back in June2015 and hit her head. CT of the head at that time without abnormalities her headaches returned and she started taking her Depakote again with good benefit. Her memory is stable per MMSE. 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  She has not had any issues performing  her job. She had breast cancer surgery January 2015. She had knee replacement on the right 12/22/2015 She returns for reevaluation and refills   HISTORY: of cognitive changes post head injury. She was initially evaluated by Dr. Leonie Man in September of 2009. She was operating a push mower when she was going down a ditch in front of her home and toppled over the handle and hit her head on the motor of the mower. At that time she was not sure she loss consciousness or not,she was able to get up go indoors. She did not seek medical help that day, however she did see her primary care the next day when she was complaining of severe headache, appeared to be confused and disoriented and did not recognize the Dr. CT of the head showed no acute abnormality. She has since had an MRI of the brain which was read as normal as well as an EEG that was normal. She returns today continuing to complain with short-term memory, attention, and concentration difficulties but she continues to work and she has not had any difficulty performing her job duties. She says she manages by using sticky notes, so that she will not forget things. She denies any depression ,she is trying to exercise and has lost weight since last seen. She does complain of early morning headaches and fatigue, does not feel rested after sleeping although  that is better with Trazodone. She is currently on Depakote 500 daily 2 daily for headaches.Her headaches have returned since she is back to teaching after being off for the summer. She says these are stress related. She does not want to increase medication. There is no nausea, photophobia, with the headaches. 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 Fell several weeks ago and hit the back of the head. No obvious injury. See ROS  07/18/12: Returns for followup. MMSE stable. Went off Depakote for a few weeks and headaches returned and mood was terrible. Her PCP gave her a RX to get back on the Depakote. Headaches are stable at present. Continues to work with out problems with performing job. Had hernia repair in March and complication of PE after surgery. Has just gotten off coumadin. No new complaints   REVIEW OF SYSTEMS: Full 14 system review of systems performed and notable only for those listed, all others are neg:  Constitutional: neg  Cardiovascular: neg Ear/Nose/Throat: neg  Skin: neg Eyes: neg Respiratory: neg Gastroitestinal: neg  Hematology/Lymphatic: neg  Endocrine: neg Musculoskeletal:neg Allergy/Immunology: neg Neurological:  headaches Psychiatric: neg Sleep : Insomnia   ALLERGIES: Allergies  Allergen Reactions  . Aleve [Naproxen Sodium] Hives, Itching and Swelling  .  Aromasin [Exemestane] Anaphylaxis, Itching and Swelling    Pt currently tolerating medication well (02/03/16)-gwd; Pt stated "I can take this medication for a few months at a time and I take a benadryl when I feel it starting to come on" Patient has facial swelling, itching, and throat swelling  07/12/17  Taking this right now and no problem. sy/rn  . Letrozole Shortness Of Breath, Rash and Other (See Comments)    Swelling - feet, eyes,hands;   Speech garbled  . Levofloxacin Rash and Swelling    Facial swelling and red facial rash  . Penicillins Hives, Swelling and Rash    All over the  body. Has patient had a PCN reaction causing immediate rash, facial/tongue/throat swelling, SOB or lightheadedness with hypotension: Yes Has patient had a PCN reaction causing severe rash involving mucus membranes or skin necrosis: No Has patient had a PCN reaction that required hospitalization No Has patient had a PCN reaction occurring within the last 10 years: No If all of the above answers are "NO", then may proceed with Cephalosporin use.   . Symbicort [Budesonide-Formoterol Fumarate] Anaphylaxis, Hives, Swelling and Rash  . Codeine Other (See Comments)    hallucinations  . Percocet [Oxycodone-Acetaminophen] Other (See Comments)    Causes syncope day after medication has been taken.  . Vicodin [Hydrocodone-Acetaminophen] Other (See Comments)    Causes syncope day after medication has been taken.  . Doxycycline Swelling    Must with  antihistamine  . Ezetimibe Other (See Comments)    Myalgias,fatigue  . Prednisone Other (See Comments)    Can take with antihistamine/ can take the shot without antihistamine  . Cefdinir Rash and Swelling    HOME MEDICATIONS: Outpatient Medications Prior to Visit  Medication Sig Dispense Refill  . albuterol (VENTOLIN HFA) 108 (90 Base) MCG/ACT inhaler Inhale 2 puffs into the lungs every 6 (six) hours as needed for wheezing or shortness of breath.     . ALPRAZolam (XANAX) 0.5 MG tablet Take 0.5 mg by mouth at bedtime as needed for sleep.     Marland Kitchen amphetamine-dextroamphetamine (ADDERALL) 20 MG tablet Take 20 mg by mouth See admin instructions. Take 31m in the morning and 258min the afternoon.    . divalproex (DEPAKOTE ER) 500 MG 24 hr tablet Take 1 tablet (500 mg total) by mouth at bedtime. 90 tablet 3  . fexofenadine (ALLEGRA) 60 MG tablet Take 60 mg by mouth daily.     . Marland Kitchenosartan (COZAAR) 50 MG tablet Take by mouth.    . Marland Kitchenmeprazole (PRILOSEC) 40 MG capsule TAKE 1 CAPSULE BY MOUTH DAILY (Patient taking differently: Take 40 mg by mouth daily.) 30 capsule  0  . oxybutynin (DITROPAN-XL) 10 MG 24 hr tablet Take 10 mg by mouth daily.   0  . REPATHA SURECLICK 14001G/ML SOAJ INJECT 1 DOSE INTO THE SKIN EVERY 14 DAYS 2 mL 11  . hydrochlorothiazide (HYDRODIURIL) 25 MG tablet Take 25 mg by mouth at bedtime.      Facility-Administered Medications Prior to Visit  Medication Dose Route Frequency Provider Last Rate Last Admin  . fluticasone (FLOVENT HFA) 44 MCG/ACT inhaler 2 puff  2 puff Inhalation BID SoChesley MiresMD        PAST MEDICAL HISTORY: Past Medical History:  Diagnosis Date  . Abdominal pain   . Anxiety   . Arthritis   . Asthma   . Breast cancer ([facility]   left upper inner  . Cancer ([facility]   breast  .  Diabetes mellitus without complication ([place])    Takes Tradjenta  . Family history of adverse reaction to anesthesia    Patients mother had to be resusciated after having anesthesia  . GERD (gastroesophageal reflux disease)   . Headache(784.0)    Takes Depakote  . Hernia, incisional periumbilical, incarcerated 8/91/6945  . Hot flashes   . Hx of radiation therapy 09/09/13- 10/13/13   left breast 5000 cGy in 25 sessions, no boost  . Hyperlipidemia   . Hypertension    PCP DR Nolon Rod  . Hypothyroidism   . Non-alcoholic fatty liver disease   . Peripheral vascular disease ([place])    pulmonary embolis-still have some  . Post concussion syndrome    4 yrs ago- sees [facility]- Dr Leonie Man every 6 months-   has sleep study 2011 following accident but was neg per patient-  short term memory issues, dates and times  . Pulmonary embolism ([place]) 2013  . Recurrent upper respiratory infection (URI)    FLU 08/23/11-08/29/11 then URI 08/29/11- 09/08/11- S/P zpack and steroids-  improved      no cough or congestion  . Thyroid disease   . 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 Ventral hernia     PAST SURGICAL HISTORY: Past Surgical History:  Procedure Laterality Date  . ANTERIOR CERVICAL DECOMP/DISCECTOMY FUSION N/A  01/16/2020   Procedure: c5-6, c6-7 ANTERIOR CERVICAL DECOMPRESSION/DISCECTOMY FUSION, ALLOGRAFT AND PLATE;  Surgeon: Marybelle Killings, MD;  [facility];  Service: Orthopedics;  Laterality: N/A;  . APPENDECTOMY  2002   lap appy.  Dr. Hassell Done  . BREAST BIOPSY    . BREAST LUMPECTOMY Left 2015   radiation  . BREAST LUMPECTOMY WITH NEEDLE LOCALIZATION AND AXILLARY SENTINEL LYMPH NODE BX Left 08/04/2013   Procedure: BREAST LUMPECTOMY WITH NEEDLE LOCALIZATION AND AXILLARY SENTINEL LYMPH NODE Biopsy x 3;  Surgeon: Rolm Bookbinder, MD;  Location: [facility];  Service: General;  Laterality: Left;  . BREAST SURGERY    . CARPAL TUNNEL RELEASE  10/2009-12/2010   left - right  . CESAREAN SECTION  X 2  . COLONOSCOPY    . DIAGNOSTIC LAPAROSCOPY     Lap. chole., appendectomy, hernia repair  . HERNIA REPAIR  09/15/11   Ventral w/mesh  . JOINT REPLACEMENT    . KNEE ARTHROPLASTY Right 12/22/2015   Procedure: COMPUTER ASSISTED TOTAL KNEE ARTHROPLASTY;  Surgeon: Marybelle Killings, MD;  Location: [facility];  Service: Orthopedics;  Laterality: Right;  . LAPAROSCOPIC CHOLECYSTECTOMY  2006   Dr. Bubba Camp  . TARSAL TUNNEL RELEASE Bilateral   . THYROIDECTOMY, PARTIAL    . VENTRAL HERNIA REPAIR  09/29/2011   Procedure: LAPAROSCOPIC VENTRAL HERNIA;  Surgeon: Adin Hector, MD;  Location: [facility];  Service: General;  Laterality: N/A;  Laparoscopic Ventral Wall Hernia Repair with Mesh    FAMILY HISTORY: Family History  Problem Relation Age of Onset  . Malignant hyperthermia Mother   . Heart attack Father   . Stroke Father   . Diabetes Other        Grandmother  . Other Other        respiratory- Grandmother  . Cancer Maternal Grandmother 56       breast  . Breast cancer Maternal Grandmother     SOCIAL HISTORY: Social History   Socioeconomic History  . Marital status: Divorced    Spouse name: Not on file  . Number of children: 2  . Years of education: Master's  . Highest education level: Not  on file  Occupational  History    Employer: [employer]  Tobacco Use  . Smoking status: Never Smoker  . Smokeless tobacco: Never Used  Vaping Use  . Vaping Use: Never used  Substance and Sexual Activity  . Alcohol use: No    Alcohol/week: 1.0 standard drink    Types: 1 Glasses of wine per week  . Drug use: No  . Sexual activity: Yes    Comment: Menarche age 66, first live birth age 30,  [place] P2. She stopped having periods approximately 1999 and took HRT until 2005.  Other Topics Concern  . Not on file  Social History Narrative   Patient is divorced and lives alone.   Patient has two children.   Patient is a Pharmacist, hospital in [employer]   Patient has a Master's degree.   Patient drinks 3-4 cups of caffeine daily.    Patient is right handed.   Social Determinants of Health   Financial Resource Strain: Not on file  Food Insecurity: Not on file  Transportation Needs: Not on file  Physical Activity: Not on file  Stress: Not on file  Social Connections: Not on file  Intimate Partner Violence: Not on file     PHYSICAL EXAM  Vitals:   08/25/20 1536  BP: (!) 168/92  Pulse: 74  Weight: 213 lb (96.6 kg)  Height: 5' 2"  (1.575 m)   Body mass index is 38.96 kg/m.  Generalized: Well developed,pleasant pleasant middle-aged female in no acute distress  Head: normocephalic and atraumatic,. Oropharynx benign  Neck: Cervical collar in place s/p cervical fusion Musculoskeletal: No deformity   Neurological examination   Mentation: Awake and alert.  Follows all commands.  Speech and language fluent.  Short-term and long-term memory intact.  Attention and fund of knowledge appropriate. Cranial Nerves: Pupils equal, briskly reactive to light. Extraocular movements full without nystagmus. Visual fields full to confrontation. Hearing intact. Facial sensation intact. Face, tongue, palate moves normally and symmetrically.  Motor: Normal bulk and tone. Normal strength in all tested  extremity muscles. Sensory.: intact to touch , pinprick , position and vibratory sensation.  Coordination: Rapid alternating movements normal in all extremities. Finger-to-nose and heel-to-shin performed accurately bilaterally. Gait and Station: Arises from chair without difficulty. Stance is normal. Gait demonstrates normal stride length and balance without use of assistive device Reflexes: 1+ and symmetric. Toes downgoing.        ASSESSMENT AND PLAN  70 y.o. year old female  has a past medical history of Hyperlipidemia; Post concussion syndrome with memory loss and headache and HTN.       1. Postconcussive syndrome:  a. Headaches:  i. Continued headaches 3-4 times weekly therefore recommend initiating metoprolol 25 mg twice daily which may also benefit elevated blood pressures.  She was advised to monitor her blood pressure and heart rate at home and to call office after 1 week if no benefit for possible increase ii. Continue Depakote ER 500 mg nightly -she has attempted to discontinue multiple times with worsening of headaches.  Discussed possible need of discontinuing in the future due to elevated liver enzymes iii. No indication for repeat lab work as previously completed 6 months ago iv. Discuss possible underlying sleep apnea contributing to headaches especially with insomnia and difficulty to control blood pressure.  She is not currently interested in further evaluation.  She will consider sleep evaluation and will call office if interested in pursuing in the future b. Memory loss stable  Follow-up in 6 months or call earlier if needed  CC:  [facility] provider: Dr. Anna Genre, Earnest Conroy, MD    I spent 30 minutes of face-to-face and non-face-to-face time with patient.  This included previsit chart review, lab review, study review, order entry, electronic health record documentation, patient education and therapeutic management regarding below diagnosis:  Post concussion  syndrome  Frann Rider, [facility]  [facility] [address] [place], [place] [address]  Phone [phone] Fax [phone] Note: This document was prepared with digital dictation and possible smart phrase technology. Any transcriptional errors that result from this process are unintentional.

## 2020-08-26 NOTE — Progress Notes (Signed)
I agree with the above plan 

## 2020-08-30 ENCOUNTER — Ambulatory Visit
Admission: RE | Admit: 2020-08-30 | Discharge: 2020-08-30 | Disposition: A | Discharge status: Home / Self Care | Payer: Medicare PPO | Source: Ambulatory Visit | Attending: Family Medicine | Admitting: Family Medicine

## 2020-08-30 DIAGNOSIS — K760 Fatty (change of) liver, not elsewhere classified: Secondary | ICD-10-CM

## 2020-09-15 ENCOUNTER — Encounter: Payer: Self-pay | Admitting: Adult Health

## 2020-09-15 ENCOUNTER — Telehealth: Payer: Self-pay | Admitting: *Deleted

## 2020-09-15 ENCOUNTER — Telehealth: Payer: Self-pay | Admitting: Adult Health

## 2020-09-15 NOTE — Telephone Encounter (Signed)
Review of recent PCP note, they wanted her to continue current regimen and add amlodipine.  I do not see anywhere where they recommended discontinuing losartan or metoprolol.  She is likely having headaches due to significantly elevated blood pressures I would highly encourage her to contact her PCP for further instruction but blood pressures stay consistently elevated, headache worsens or any other abnormal symptoms would recommend proceeding to ED for further evaluation

## 2020-09-15 NOTE — Telephone Encounter (Signed)
Pt. states that PCP taken her off of medication prescribed by our office because it didn't work with BP. Please advise.

## 2020-09-15 NOTE — Telephone Encounter (Signed)
I spoke to pt and she stated :I'm having spikes in blood pressure and went to see regular doctor Eksir. She took me off losartan and metoprolol. Now I'm taking amlodipine besylate 31m pm. I'm having terrible headaches so I have been taking my depakote am which she states it helps.  I'm checking my blood pressure 2times a day and having spikes of 191-194/96-114 I informed her to continue with pcp re: to Bp.  If JM/NP has recommendations concerning headaches will let her know.

## 2020-09-15 NOTE — Telephone Encounter (Signed)
Received my chart: I'm having spikes in blood pressure and went to see regular doctor Eksir. She took me off losartan and metoprolol. Now I'm taking amlodipine besylate 35m pm. I'm having terrible headaches so I have been taking my depakote am. I'm checking my blood pressure 2times a day and having spikes of 191-194/96-114 Called patient and adivsed her of NP's reply:  Review of recent PCP note, they wanted her to continue current regimen and add amlodipine.  I do not see anywhere where they recommended discontinuing losartan or metoprolol.  She is likely having headaches due to significantly elevated blood pressures I would highly encourage her to contact her PCP for further instruction but blood pressures stay consistently elevated, headache worsens or any other abnormal symptoms would recommend proceeding to ED for further evaluation She stated she would call PCP now,  verbalized understanding, appreciation.

## 2020-09-15 NOTE — Addendum Note (Signed)
Addended by: Brandon Melnick on: 09/15/2020 02:18 PM   Modules accepted: Orders

## 2020-09-22 LAB — LIPID PANEL
Chol/HDL Ratio: 2.9 ratio (ref 0.0–4.4)
Cholesterol, Total: 150 mg/dL (ref 100–199)
HDL: 52 mg/dL (ref >39)
LDL Chol Calc (NIH): 75 mg/dL (ref 0–99)
Triglycerides: 133 mg/dL (ref 0–149)
VLDL Cholesterol Cal: 23 mg/dL (ref 5–40)

## 2020-09-23 ENCOUNTER — Ambulatory Visit: Payer: Medicare PPO

## 2020-09-24 ENCOUNTER — Ambulatory Visit: Payer: Medicare PPO

## 2020-11-16 ENCOUNTER — Ambulatory Visit
Admission: RE | Admit: 2020-11-16 | Discharge: 2020-11-16 | Disposition: A | Discharge status: Home / Self Care | Payer: Medicare PPO | Source: Ambulatory Visit | Attending: Oncology | Admitting: Oncology

## 2020-11-16 ENCOUNTER — Other Ambulatory Visit: Payer: Self-pay

## 2020-11-16 DIAGNOSIS — Z1231 Encounter for screening mammogram for malignant neoplasm of breast: Secondary | ICD-10-CM

## 2020-12-01 ENCOUNTER — Other Ambulatory Visit: Payer: Self-pay

## 2020-12-01 ENCOUNTER — Encounter: Payer: Self-pay | Admitting: Internal Medicine

## 2020-12-01 ENCOUNTER — Ambulatory Visit: Payer: Medicare PPO | Admitting: Internal Medicine

## 2020-12-01 VITALS — BP 148/82 | HR 72 | Ht 62.0 in | Wt 211.6 lb

## 2020-12-01 DIAGNOSIS — I10 Essential (primary) hypertension: Secondary | ICD-10-CM

## 2020-12-01 DIAGNOSIS — E7849 Other hyperlipidemia: Secondary | ICD-10-CM | POA: Diagnosis not present

## 2020-12-01 DIAGNOSIS — I2510 Atherosclerotic heart disease of native coronary artery without angina pectoris: Secondary | ICD-10-CM

## 2020-12-01 DIAGNOSIS — Z79899 Other long term (current) drug therapy: Secondary | ICD-10-CM | POA: Diagnosis not present

## 2020-12-01 DIAGNOSIS — E785 Hyperlipidemia, unspecified: Secondary | ICD-10-CM

## 2020-12-01 DIAGNOSIS — I2584 Coronary atherosclerosis due to calcified coronary lesion: Secondary | ICD-10-CM

## 2020-12-01 MED ORDER — CHLORTHALIDONE 25 MG PO TABS: 25.0000 mg | ORAL_TABLET | Freq: Every day | ORAL | 3 refills | Status: DC

## 2020-12-01 NOTE — Progress Notes (Signed)
LIPID CLINIC CONSULT NOTE  Chief Complaint:  Follow-up dyslipidemia  Primary Care Physician: Nickola Major, MD  HPI:  Alyssa [name] is a 70 y.o. female who is being seen today for the evaluation of dyslipidemia at the request of Eksir, Earnest Conroy, MD.  This is a very pleasant 70 year old female with a significant history of dyslipidemia.  She first noted she had elevated cholesterol in her 27s.  She has a strong family history of elevated cholesterol and heart disease.  Both of her brothers are age 39 and 32 and have elevated cholesterols over 400.  Her mom also had elevated cholesterol and her father had heart disease and died at age 45.  Unfortunately she has been intolerant to statins and is been on numerous statins in the past including Crestor, Lipitor, lovastatin and ezetimibe.  She also has nonalcoholic fatty liver disease.  Most recently her total cholesterol was 343, triglycerides 154, HDL of 40 and LDL of 272.  This is after losing 40 pounds recently although she is still moderately obese on the ketogenic diet.  She is also been taking Crestor but had significant pain with it and then finally discontinued it.  She has no known coronary disease although did have a pulmonary embolus in 2013.  I personally reviewed her CT scan which does show multivessel coronary artery calcification.  Is a history of breast cancer, diabetes which has resolved with weight loss, and postconcussive syndrome.  She reports that she had had a fairly atherogenic diet however recently again switched ketogenic diet and is cut out a lot of "bad saturated fats".  03/13/2019  Alyssa [name] returns today for follow-up.  Recently had repeat lipids which showed marked improvement in numbers.  Total cholesterol is now 168 with triglycerides 132, HDL 52 and LDL of 90.  He is tolerating Praluent without any side effects.  Overall she is very pleased with her improved cholesterol numbers.  10/17/2019  Alyssa [name] returns  today for follow-up.  Overall she is doing well.  Her repeat lipid recently showed total cholesterol 151, triglycerides 142, HDL 51 and LDL 75.  This is slightly improved and she has subsequently switched from Praluent to [place] due to a change in her primary insurance.  Overall she is pleased with the medication.  12/01/2020  Alyssa [name] continues to have good control of her dyslipidemia.  Total cholesterol 150, HDL 52, triglycerides 133 and LDL 75.  She is tolerating Repatha well.  Unfortunately, she has had issues with elevated blood pressure.  She reports her primary has tried a number of different medications but they have either not gotten her to target or have been associated with side effects.  More recently she was switched off of the diuretic and onto amlodipine.  Initially this was 5 mg but then increased to 7.5 mg daily.  Since then she has had more lower extremity edema.  Blood pressure remains uncontrolled now 148/82 but improved as it was previously between 947 and 096 systolic.  She is asked for my assistance in managing her blood pressure.  PMHx:  Past Medical History:  Diagnosis Date  . Abdominal pain   . Anxiety   . Arthritis   . Asthma   . Breast cancer ([place]) 06/2013   left upper inner  . Cancer ([place])    breast  . Diabetes mellitus without complication ([place])    Takes Tradjenta  . Family history of adverse reaction to anesthesia    Patients mother  had to be resusciated after having anesthesia  . GERD (gastroesophageal reflux disease)   . Headache(784.0)    Takes Depakote  . Hernia, incisional periumbilical, incarcerated 0/93/2671  . Hot flashes   . Hx of radiation therapy 09/09/13- 10/13/13   left breast 5000 cGy in 25 sessions, no boost  . Hyperlipidemia   . Hypertension    PCP DR Nolon Rod  . Hypothyroidism   . Non-alcoholic fatty liver disease   . Peripheral vascular disease ([place])    pulmonary embolis-still have some  . Post concussion syndrome    4 yrs ago-  sees [facility]- Dr Leonie Man every 6 months-   has sleep study 2011 following accident but was neg per patient-  short term memory issues, dates and times  . Pulmonary embolism ([place]) 2013  . Recurrent upper respiratory infection (URI)    FLU 08/23/11-08/29/11 then URI 08/29/11- 09/08/11- S/P zpack and steroids-  improved      no cough or congestion  . Thyroid disease   . 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 Ventral hernia     Past Surgical History:  Procedure Laterality Date  . ANTERIOR CERVICAL DECOMP/DISCECTOMY FUSION N/A 01/16/2020   Procedure: c5-6, c6-7 ANTERIOR CERVICAL DECOMPRESSION/DISCECTOMY FUSION, ALLOGRAFT AND PLATE;  Surgeon: Marybelle Killings, MD;  Location: [facility];  Service: Orthopedics;  Laterality: N/A;  . APPENDECTOMY  2002   lap appy.  Dr. Hassell Done  . BREAST BIOPSY    . BREAST LUMPECTOMY Left 2015   radiation  . BREAST LUMPECTOMY WITH NEEDLE LOCALIZATION AND AXILLARY SENTINEL LYMPH NODE BX Left 08/04/2013   Procedure: BREAST LUMPECTOMY WITH NEEDLE LOCALIZATION AND AXILLARY SENTINEL LYMPH NODE Biopsy x 3;  Surgeon: Rolm Bookbinder, MD;  Location: [facility];  Service: General;  Laterality: Left;  . BREAST SURGERY    . CARPAL TUNNEL RELEASE  10/2009-12/2010   left - right  . CESAREAN SECTION  X 2  . COLONOSCOPY    . DIAGNOSTIC LAPAROSCOPY     Lap. chole., appendectomy, hernia repair  . HERNIA REPAIR  09/15/11   Ventral w/mesh  . JOINT REPLACEMENT    . KNEE ARTHROPLASTY Right 12/22/2015   Procedure: COMPUTER ASSISTED TOTAL KNEE ARTHROPLASTY;  Surgeon: Marybelle Killings, MD;  Location: [facility];  Service: Orthopedics;  Laterality: Right;  . LAPAROSCOPIC CHOLECYSTECTOMY  2006   Dr. Bubba Camp  . TARSAL TUNNEL RELEASE Bilateral   . THYROIDECTOMY, PARTIAL    . VENTRAL HERNIA REPAIR  09/29/2011   Procedure: LAPAROSCOPIC VENTRAL HERNIA;  Surgeon: Adin Hector, MD;  [facility];  Service: General;  Laterality: N/A;  Laparoscopic Ventral Wall Hernia Repair with Mesh     FAMHx:  Family History  Problem Relation Age of Onset  . Malignant hyperthermia Mother   . Heart attack Father   . Stroke Father   . Diabetes Other        Grandmother  . Other Other        respiratory- Grandmother  . Cancer Maternal Grandmother 61       breast  . Breast cancer Maternal Grandmother     SOCHx:   reports that she has never smoked. She has never used smokeless tobacco. She reports that she does not drink alcohol and does not use drugs.  ALLERGIES:  Allergies  Allergen Reactions  . Aleve [Naproxen Sodium] Hives, Itching and Swelling  . Aromasin [Exemestane] Anaphylaxis, Itching and Swelling    Pt currently tolerating medication well (02/03/16)-gwd; Pt stated "I can  take this medication for a few months at a time and I take a benadryl when I feel it starting to come on" Patient has facial swelling, itching, and throat swelling  07/12/17  Taking this right now and no problem. sy/rn  . Letrozole Shortness Of Breath, Rash and Other (See Comments)    Swelling - feet, eyes,hands;   Speech garbled  . Levofloxacin Rash and Swelling    Facial swelling and red facial rash  . Penicillins Hives, Swelling and Rash    All over the body. Has patient had a PCN reaction causing immediate rash, facial/tongue/throat swelling, SOB or lightheadedness with hypotension: Yes Has patient had a PCN reaction causing severe rash involving mucus membranes or skin necrosis: No Has patient had a PCN reaction that required hospitalization No Has patient had a PCN reaction occurring within the last 10 years: No If all of the above answers are "NO", then may proceed with Cephalosporin use.   . Symbicort [Budesonide-Formoterol Fumarate] Anaphylaxis, Hives, Swelling and Rash  . Codeine Other (See Comments)    hallucinations  . Percocet [Oxycodone-Acetaminophen] Other (See Comments)    Causes syncope day after medication has been taken.  . Vicodin [Hydrocodone-Acetaminophen] Other (See  Comments)    Causes syncope day after medication has been taken.  . Doxycycline Swelling    Must with  antihistamine  . Ezetimibe Other (See Comments)    Myalgias,fatigue  . Prednisone Other (See Comments)    Can take with antihistamine/ can take the shot without antihistamine  . Cefdinir Rash and Swelling    ROS: Pertinent items noted in HPI and remainder of comprehensive ROS otherwise negative.  HOME MEDS: Current Outpatient Medications on File Prior to Visit  Medication Sig Dispense Refill  . albuterol (VENTOLIN HFA) 108 (90 Base) MCG/ACT inhaler Inhale 2 puffs into the lungs every 6 (six) hours as needed for wheezing or shortness of breath.     . ALPRAZolam (XANAX) 0.5 MG tablet Take 0.5 mg by mouth at bedtime as needed for sleep.     Marland Kitchen amLODipine (NORVASC) 5 MG tablet Take 1 tablet by mouth daily.    Marland Kitchen amphetamine-dextroamphetamine (ADDERALL) 20 MG tablet Take 20 mg by mouth See admin instructions. Take 46m in the morning and 240min the afternoon.    . divalproex (DEPAKOTE ER) 500 MG 24 hr tablet Take 1 tablet (500 mg total) by mouth at bedtime. (Patient taking differently: Take 500 mg by mouth daily.) 90 tablet 3  . fexofenadine (ALLEGRA) 60 MG tablet Take 60 mg by mouth daily.     . Marland Kitchenmeprazole (PRILOSEC) 40 MG capsule TAKE 1 CAPSULE BY MOUTH DAILY (Patient taking differently: Take 40 mg by mouth daily.) 30 capsule 0  . oxybutynin (DITROPAN-XL) 10 MG 24 hr tablet Take 10 mg by mouth daily.   0  . REPATHA SURECLICK 14725G/ML SOAJ INJECT 1 DOSE INTO THE SKIN EVERY 14 DAYS 2 mL 11  . thyroid (ARMOUR) 90 MG tablet 1 tablet on an empty stomach     Current Facility-Administered Medications on File Prior to Visit  Medication Dose Route Frequency Provider Last Rate Last Admin  . fluticasone (FLOVENT HFA) 44 MCG/ACT inhaler 2 puff  2 puff Inhalation BID SoChesley MiresMD        LABS/IMAGING: No results found for this or any previous visit (from the past 48 hour(s)). No results  found.  LIPID PANEL:    Component Value Date/Time   CHOL 150 09/21/2020 0917   TRIG  133 09/21/2020 0917   HDL 52 09/21/2020 0917   CHOLHDL 2.9 09/21/2020 0917   LDLCALC 75 09/21/2020 0917    WEIGHTS: Wt Readings from Last 3 Encounters:  12/01/20 211 lb 9.6 oz (96 kg)  08/25/20 213 lb (96.6 kg)  02/24/20 208 lb (94.3 kg)    VITALS: BP (!) 148/82   Pulse 72   Ht 5' 2"  (1.575 m)   Wt 211 lb 9.6 oz (96 kg)   SpO2 97%   BMI 38.70 kg/m   EXAM: Deferred  EKG: Deferred  ASSESSMENT: 1. 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 Family history of premature coronary disease 3. Statin intolerance 4. Multivessel coronary calcification 5. Uncontrolled hypertension  PLAN: 1.   Alyssa [name] has had good follow-up her cholesterol on Repatha.  LDL remains low.  Unfortunately she has uncontrolled hypertension.  Blood pressures not been controlled despite trying multiple agents due to issues with side effects.  She will likely require more than monotherapy however to get good blood pressure control.  She is also had swelling and side effects related to the amlodipine.  I advised her to decrease it back to 5 mg daily and will add chlorthalidone 25 mg daily for additional blood pressure lowering and for her edema.  Check a metabolic profile in 2 weeks and will have a follow-up for blood pressure check in about 4 to 6 weeks.  Pixie Casino, MD, [facility], [place] Director of the Advanced Lipid Disorders &  Cardiovascular Risk Reduction Clinic Diplomate of the American Board of Clinical Lipidology Attending Cardiologist  Direct Dial: [phone]  Fax: [phone]  Website:  [email or web address] 12/01/2020, 3:13 PM

## 2020-12-01 NOTE — Patient Instructions (Signed)
Medication Instructions:  CONTINUE amlodipine 22m daily START chlorthalidone 271mdaily in the morning CONTINUE all other current medications  *If you need a refill on your cardiac medications before your next appointment, please call your pharmacy*   Lab Work: BMET (non-fasting) to be completed about 2 weeks after starting chlorthalidone  FASTING lab work to check cholesterol in 1 year   If you have labs (blood work) drawn today and your tests are completely normal, you will receive your results only by: . Marland KitchenyChart Message (if you have MyChart) OR . A paper copy in the mail If you have any lab test that is abnormal or we need to change your treatment, we will call you to review the results.   Testing/Procedures: NONE   Follow-Up: [facility].  As part of our continuing mission to provide you with exceptional heart care, we have created designated Provider Care Teams.  These Care Teams include your primary Cardiologist (physician) and Advanced Practice Providers (APPs -  Physician Assistants and Nurse Practitioners) who all work together to provide you with the care you need, when you need it.  We recommend signing up for the patient portal called "MyChart".  Sign up information is provided on this After Visit Summary.  MyChart is used to connect with patients for Virtual Visits (Telemedicine).  Patients are able to view lab/test results, encounter notes, upcoming appointments, etc.  Non-urgent messages can be sent to your provider as well.   To learn more about what you can do with MyChart, go to [email or web address]   Your next appointment:   3-4 weeks with PA/NP for BP follow up  12 months with Dr. [facility]

## 2020-12-29 LAB — BASIC METABOLIC PANEL
BUN/Creatinine Ratio: 24 (ref 12–28)
BUN: 19 mg/dL (ref 8–27)
CO2: 25 mmol/L (ref 20–29)
Calcium: 9.7 mg/dL (ref 8.7–10.3)
Chloride: 94 mmol/L — ABNORMAL LOW (ref 96–106)
Creatinine, Ser: 0.79 mg/dL (ref 0.57–1.00)
Glucose: 147 mg/dL — ABNORMAL HIGH (ref 65–99)
Potassium: 3.4 mmol/L — ABNORMAL LOW (ref 3.5–5.2)
Sodium: 140 mmol/L (ref 134–144)
eGFR: 81 mL/min/{1.73_m2} (ref >59)

## 2020-12-30 NOTE — Progress Notes (Signed)
Cardiology Office Note   Date:  12/31/2020   ID:  [name], DOB [birth year], MRN [id number]  PCP:  Alyssa Major, MD  Cardiologist: Dr. Debara [name] No chief complaint on file.    History of Present Illness: Alyssa [name] is a 70 y.o. female who presents for ongoing assessment and management of dyslipidemia and is followed by Dr. Debara [name] in the lipid clinic.  Other history includes type 2 diabetes, hypertension, thyroid disease, and left breast cancer.  She was continued on Repatha during last office visit with Dr. Debara [name] on 12/01/2020.  It was noted that her blood pressure was not well controlled despite trying multiple agents, (she is intolerant to many antihypertensives).  She was placed on amlodipine 5 mg daily with chlorthalidone 25 mg daily.  Follow-up metabolic profile and office visit was planned.  She comes today without any complaints.  Her blood pressure was very well controlled on initial reading.  She states that the medication has done a miraculous job in bringing her blood pressure under control.  She still states she is under a lot of stress, but her blood pressure has remained fairly stable.  She denies any dizziness chest pain or orthostatic symptoms.  She remains physically active.  Her only complaint is some mild lower extremity edema but that has improved with use of chlorthalidone.  Past Medical History:  Diagnosis Date   Abdominal pain    Anxiety    Arthritis    Asthma    Breast cancer ([place]) 06/2013   left upper inner   Cancer ([place])    breast   Diabetes mellitus without complication ([place])    Takes Tradjenta   Family history of adverse reaction to anesthesia    Patients mother had to be resusciated after having anesthesia   GERD (gastroesophageal reflux disease)    Headache(784.0)    Takes Depakote   Hernia, incisional periumbilical, incarcerated 11/14/5359   Hot flashes    Hx of radiation therapy 09/09/13- 10/13/13   left breast 5000 cGy in 25 sessions, no boost    Hyperlipidemia    Hypertension    PCP DR Alyssa [name]   Hypothyroidism    Non-alcoholic fatty liver disease    Peripheral vascular disease ([place])    pulmonary embolis-still have some   Post concussion syndrome    4 yrs ago- sees Alyssa [name]- Dr Leonie Man every 6 months-   has sleep study 2011 following accident but was neg per patient-  short term memory issues, dates and times   Pulmonary embolism ([place]) 2013   Recurrent upper respiratory infection (URI)    FLU 08/23/11-08/29/11 then URI 08/29/11- 09/08/11- S/P zpack and steroids-  improved      no cough or congestion   Thyroid disease    Thyroid mass, left inferior lobe, 3.6cm [id number] 10/07/2011   Ventral hernia     Past Surgical History:  Procedure Laterality Date   ANTERIOR CERVICAL DECOMP/DISCECTOMY FUSION N/A 01/16/2020   Procedure: c5-6, c6-7 ANTERIOR CERVICAL DECOMPRESSION/DISCECTOMY FUSION, ALLOGRAFT AND PLATE;  Surgeon: Alyssa Killings, MD;  Location: [facility];  Service: Orthopedics;  Laterality: N/A;   APPENDECTOMY  2002   lap appy.  Dr. Hassell Done   BREAST BIOPSY     BREAST LUMPECTOMY Left 2015   radiation   BREAST LUMPECTOMY WITH NEEDLE LOCALIZATION AND AXILLARY SENTINEL LYMPH NODE BX Left 08/04/2013   Procedure: BREAST LUMPECTOMY WITH NEEDLE LOCALIZATION AND AXILLARY SENTINEL LYMPH NODE Biopsy x 3;  Surgeon: Rolm Bookbinder, MD;  [facility];  Service: General;  Laterality: Left;   BREAST SURGERY     CARPAL TUNNEL RELEASE  10/2009-12/2010   left - right   CESAREAN SECTION  X 2   COLONOSCOPY     DIAGNOSTIC LAPAROSCOPY     Lap. chole., appendectomy, hernia repair   HERNIA REPAIR  09/15/11   Ventral w/mesh   JOINT REPLACEMENT     KNEE ARTHROPLASTY Right 12/22/2015   Procedure: COMPUTER ASSISTED TOTAL KNEE ARTHROPLASTY;  Surgeon: Alyssa Killings, MD;  Location: [facility];  Service: Orthopedics;  Laterality: Right;   LAPAROSCOPIC CHOLECYSTECTOMY  2006   Dr. Gean Maidens TUNNEL RELEASE Bilateral    THYROIDECTOMY, PARTIAL     VENTRAL  HERNIA REPAIR  09/29/2011   Procedure: LAPAROSCOPIC VENTRAL HERNIA;  Surgeon: Adin Hector, MD;  [facility];  Service: General;  Laterality: N/A;  Laparoscopic Ventral Wall Hernia Repair with Mesh     Current Outpatient Medications  Medication Sig Dispense Refill   albuterol (VENTOLIN HFA) 108 (90 Base) MCG/ACT inhaler Inhale 2 puffs into the lungs every 6 (six) hours as needed for wheezing or shortness of breath.      ALPRAZolam (XANAX) 0.5 MG tablet Take 0.5 mg by mouth at bedtime as needed for sleep.      amLODipine (NORVASC) 5 MG tablet Take 1 tablet by mouth daily.     amphetamine-dextroamphetamine (ADDERALL) 20 MG tablet Take 20 mg by mouth See admin instructions. Take 31m in the morning and 269min the afternoon.     divalproex (DEPAKOTE ER) 500 MG 24 hr tablet Take 1 tablet (500 mg total) by mouth at bedtime. (Patient taking differently: Take 500 mg by mouth daily.) 90 tablet 3   fexofenadine (ALLEGRA) 60 MG tablet Take 60 mg by mouth daily.      omeprazole (PRILOSEC) 40 MG capsule TAKE 1 CAPSULE BY MOUTH DAILY (Patient taking differently: Take 40 mg by mouth daily.) 30 capsule 0   oxybutynin (DITROPAN-XL) 10 MG 24 hr tablet Take 10 mg by mouth daily.   0   potassium chloride (KLOR-CON) 10 MEQ tablet Take 1 tablet (10 mEq total) by mouth daily. 90 tablet 1   REPATHA SURECLICK 14732G/ML SOAJ INJECT 1 DOSE INTO THE SKIN EVERY 14 DAYS 2 mL 11   thyroid (ARMOUR) 90 MG tablet 1 tablet on an empty stomach     chlorthalidone (HYGROTON) 25 MG tablet Take 1 tablet (25 mg total) by mouth daily. 90 tablet 1   Current Facility-Administered Medications  Medication Dose Route Frequency Provider Last Rate Last Admin   fluticasone (FLOVENT HFA) 44 MCG/ACT inhaler 2 puff  2 puff Inhalation BID SoChesley MiresMD        Allergies:   Aleve [naproxen sodium], Aromasin [exemestane], Letrozole, Levofloxacin, Penicillins, Symbicort [budesonide-formoterol fumarate], Codeine, Percocet  [oxycodone-acetaminophen], Vicodin [hydrocodone-acetaminophen], Doxycycline, Ezetimibe, Prednisone, Cefdinir, and Losartan    Social History:  The patient  reports that she has never smoked. She has never used smokeless tobacco. She reports that she does not drink alcohol and does not use drugs.   Family History:  The patient's family history includes Breast cancer in her maternal grandmother; Cancer (age of onset: 8534in her maternal grandmother; Diabetes in an other family member; Heart attack in her father; Malignant hyperthermia in her mother; Other in an other family member; Stroke in her father.    ROS: All other systems are reviewed and negative. Unless otherwise mentioned in H&P    PHYSICAL EXAM: VS:  BP 132/78   Pulse 72   Ht 5' 2.5" (1.588 m)   Wt 210 lb (95.3 kg)   SpO2 97%   BMI 37.80 kg/m  , BMI Body mass index is 37.8 kg/m. GEN: Well nourished, well developed, in no acute distress HEENT: normal Neck: no JVD, carotid bruits, or masses Cardiac: RRR; no murmurs, rubs, or gallops,no edema  Respiratory:  Clear to auscultation bilaterally, normal work of breathing GI: soft, nontender, nondistended, + BS MS: no deformity or atrophy Skin: warm and dry, no rash [name]:  Strength and sensation are intact Psych: euthymic mood, full affect   EKG: Not completed this office visit   Recent Labs: 01/14/2020: ALT 74; Hemoglobin 14.4; Platelets 280 12/28/2020: BUN 19; Creatinine, Ser 0.79; Potassium 3.4; Sodium 140    Lipid Panel    Component Value Date/Time   CHOL 150 09/21/2020 0917   TRIG 133 09/21/2020 0917   HDL 52 09/21/2020 0917   CHOLHDL 2.9 09/21/2020 0917   LDLCALC 75 09/21/2020 0917      Wt Readings from Last 3 Encounters:  12/31/20 210 lb (95.3 kg)  12/01/20 211 lb 9.6 oz (96 kg)  08/25/20 213 lb (96.6 kg)      Other studies Reviewed: None   ASSESSMENT AND PLAN:  1.  Hypertension: Much better controlled on current medication of amlodipine and  chlorthalidone.  Lower extremity edema has improved significantly.  She is noticing on her lab work report and my chart that her potassium was a little lower than normal range.  I will get a go ahead and start her on 10 mEq of potassium supplementation.  She does not wish to eat any potassium rich foods as she believes she is eating a good bit anyway.  The value of 3.4 is just 1/10 of a point lower than normal.  However additional potassium supplement may be helpful to her on all of her medications to include the diuretic  2.  Dyslipidemia: Believed to be familial.  Being treated by Dr. Debara [name].  She is on Repatha with excellent results.  3.  Prediabetes: She is working hard at losing weight and managing her diet.  She states that her last hemoglobin A1c was 5.5.  She is hoping to lose more weight and reduce her propensity to develop into type 2 diabetes.  4.  Thyroid disease: She is followed by PCP.   Current medicines are reviewed at length with the patient today.  I have spent 25 min's  dedicated to the care of this patient on the date of this encounter to include pre-visit review of records, assessment, management and diagnostic testing,with shared decision making.  Labs/ tests ordered today include: None Phill Myron. West Pugh, ANP, AACC   12/31/2020 1:13 PM    [facility] [place] [address] Office [phone] Fax [phone]  Notice: This dictation was prepared with Dragon dictation along with smaller phrase technology. Any transcriptional errors that result from this process are unintentional and may not be corrected upon review.

## 2020-12-31 ENCOUNTER — Other Ambulatory Visit: Payer: Self-pay

## 2020-12-31 ENCOUNTER — Encounter: Payer: Self-pay | Admitting: Adult Health

## 2020-12-31 ENCOUNTER — Encounter: Payer: Self-pay | Admitting: Nurse Practitioner

## 2020-12-31 ENCOUNTER — Ambulatory Visit
Admission: RE | Admit: 2020-12-31 | Discharge: 2020-12-31 | Disposition: A | Discharge status: Home / Self Care | Payer: Medicare PPO | Source: Ambulatory Visit | Attending: Family Medicine | Admitting: Family Medicine

## 2020-12-31 ENCOUNTER — Ambulatory Visit (INDEPENDENT_AMBULATORY_CARE_PROVIDER_SITE_OTHER): Payer: Medicare PPO | Admitting: Adult Health

## 2020-12-31 VITALS — BP 132/78 | HR 72 | Ht 62.5 in | Wt 210.0 lb

## 2020-12-31 DIAGNOSIS — M8580 Other specified disorders of bone density and structure, unspecified site: Secondary | ICD-10-CM

## 2020-12-31 DIAGNOSIS — I10 Essential (primary) hypertension: Secondary | ICD-10-CM | POA: Diagnosis not present

## 2020-12-31 MED ORDER — POTASSIUM CHLORIDE ER 10 MEQ PO TBCR: 10.0000 meq | EXTENDED_RELEASE_TABLET | Freq: Every day | ORAL | 1 refills | Status: DC

## 2020-12-31 MED ORDER — CHLORTHALIDONE 25 MG PO TABS
25.0000 mg | ORAL_TABLET | Freq: Every day | ORAL | 1 refills | Status: DC
Start: 2020-12-31 — End: 2021-04-27

## 2020-12-31 NOTE — Patient Instructions (Signed)
Medication Instructions:  START POTASSIUM 10MEQ DAILY *If you need a refill on your cardiac medications before your next appointment, please call your pharmacy*  Lab Work:   Testing/Procedures:  NONE    NONE  Follow-Up: Your next appointment:  6 week(s) In Person with You may see Pixie Casino, MD, Jory Sims, DNP or one of the following Advanced Practice Providers on your designated Care Team:  Almyra Deforest, PA-C  Fabian Sharp, [place] or Roby Lofts, PA-C   At [facility], you and your health needs are our priority.  As part of our continuing mission to provide you with exceptional heart care, we have created designated Provider Care Teams.  These Care Teams include your primary Cardiologist (physician) and Advanced Practice Providers (APPs -  Physician Assistants and Nurse Practitioners) who all work together to provide you with the care you need, when you need it.

## 2021-02-28 ENCOUNTER — Ambulatory Visit: Payer: Medicare PPO | Admitting: Adult Health

## 2021-02-28 ENCOUNTER — Other Ambulatory Visit: Payer: Self-pay

## 2021-02-28 ENCOUNTER — Encounter: Payer: Self-pay | Admitting: Adult Health

## 2021-02-28 VITALS — BP 144/88 | HR 76 | Ht 62.0 in | Wt 218.0 lb

## 2021-02-28 DIAGNOSIS — F0781 Postconcussional syndrome: Secondary | ICD-10-CM

## 2021-02-28 DIAGNOSIS — R2689 Other abnormalities of gait and mobility: Secondary | ICD-10-CM | POA: Diagnosis not present

## 2021-02-28 DIAGNOSIS — G4452 New daily persistent headache (NDPH): Secondary | ICD-10-CM | POA: Diagnosis not present

## 2021-02-28 MED ORDER — AMITRIPTYLINE HCL 50 MG PO TABS: 50.0000 mg | ORAL_TABLET | Freq: Every day | ORAL | 5 refills | Status: DC

## 2021-02-28 MED ORDER — AMITRIPTYLINE HCL 25 MG PO TABS
ORAL_TABLET | ORAL | 0 refills | Status: DC
Start: 2021-02-28 — End: 2021-04-27

## 2021-02-28 NOTE — Telephone Encounter (Signed)
Patient is following up. Please advise.

## 2021-02-28 NOTE — Progress Notes (Signed)
[facility]  PATIENT: Alyssa [name] DOB: 27-Sep-1950   REASON FOR VISIT: Follow-up postconcussion syndrome HISTORY FROM: Patient   Chief complaint: Headaches   HISTORY OF PRESENT ILLNESS:   Today, 02/28/2021, Alyssa [name] returns for routine 13-monthfollow-up.  Since prior visit, she reports having daily headaches as well as fatigue, imbalance and difficulty with word recall over the past 2 weeks - questions possible medication side effect to new BP meds started approx 1 month ago (added chlorthalidone and lowered amlodipine dose to 551mdue to edema). Plans to discuss further with Dr. HiDebara Pickett Does report improvement in her blood pressure - today 144/88 previously SBPs 180-200s.  Reports some issues with speech initially after head injury which improved.  Cognition has been stable and continues to use compensation strategies although she is concerned regarding possible progression.  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 Reports 2 falls thankfully without injury.  Generalized headache present daily which lasts all day. Can increase in severity at times and will take Tylenol with only limited benefit.  Denies photophobia or phonophobia.  Continues on Depakote but concerned regarding increase liver enzymes (AST 84, ALT 124).  No further concerns at this time.       History provided for reference purposes only Update 08/25/2020 [name] for 6-71-monthllow-up with history of postconcussion syndrome.  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 She has remained on depakote ER 500m35mghtly but reports since November she has been experiencing occasional headaches upon awakening. She will take tylenol with occasional benefit.  She also reports insomnia.  Reports previously undergoing sleep study  approximately 10 years ago and was negative for sleep apnea.  She denies snoring or any witnessed apnea.  She has been having difficulty with blood pressure control and PCP recently switched hydrochlorothiazide to losartan.  Blood pressure today 168/92 -occasionally monitors at home.  PCP is also working her up for elevated liver enzymes and has scheduled ultrasound 2/7.  No further concerns at this time.  Update 02/23/2020 JM: Alyssa [name] for postconcussion syndrome with memory loss and headaches.  Continues on Depakote ER 500 mg nightly with benefit of headache management without side effects.  Memory loss has been stable without worsening.  Since prior visit, underwent uncomplicated cervical spinal fusion on 01/16/2020.  Also started on Adderall by PCP for possible ADHD and mood swings with great improvement.  No concerns at this time.  Update 08/26/2019 JM: Alyssa [name] 68 y58r old female who is being seen today for 1 year follow-up regarding postconcussion syndrome with memory loss and headaches.  At prior visit, she was doing well in regards to stable headaches and memory.  She endorses worsening headaches since the beginning of December which are generalized associated with photophobia and phonophobia with similar characteristics compared to her prior headaches.  She has been using Tylenol 1000 mg twice daily with only mild benefit.  She does endorse increased stress as she is a high school special ed teacher and had difficulty with the virtual learning as she felt "isolated".  She has now since returned to in class learning and has been feeling better since that time but she continues to experience short-term memory loss and is concerned regarding overall worsening memory. MMSE today 29/30.  She continues to use Depakote  DR 500 mg nightly but previously on ER formulation.  She denies depression or anxiety and denies any history or family history.  She does endorse increased stress.  Recently started  on Repatha by cardiology with excellent improvement of cholesterol levels.  Blood pressure today satisfactory at 132/84.  No further concerns at this time.  UPDATE 1/2/2020[name], 70 year old female returns for follow-up with history of memory loss postconcussion syndrome and headaches.  She is currently on Depakote 500 mg at night with good control of her headaches however she does wake up with some morning headaches occasionally.  She occasionally takes Tylenol with relief.  She denies any daytime drowsiness or snoring.  She claims she feels the best she has in a long time she is exercising.  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  She denies any issues with her job performance.  Her mother recently died.  She returns for reevaluation  Update 12/20/2018[name], 70 year old female returns for follow-up with history of headaches and postconcussion syndrome and memory loss.  For her headaches she is taking Depakote but decided to taper herself off of the medication back in the summer, her headaches returned and she resumed the Depakote.  Her headaches are now in good control her memory loss is stable per MMSE.  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  She has not had no issues performing her job.  She returns for reevaluation and refills  UPDATE 07/19/16 [name], 70 year old female returns for yearly followup.  Patient fell off of the chair at school back in June2015 and hit her head. CT of the head at that time without abnormalities her headaches returned and she started taking her Depakote again with good benefit. Her memory is stable per MMSE. 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  She has not had any issues performing  her job.  She had breast cancer surgery January 2015. She had knee replacement on the right 12/22/2015 She returns for  reevaluation and refills  HISTORY: of cognitive changes post  head injury. She was initially evaluated by Dr. Leonie Man in September of 2009. She was operating a push mower when she was going down a ditch in front of her home and  toppled over the handle and hit her head on the motor of the mower.  At that time she was not sure she loss consciousness or not,she was able to get up go indoors. She did not seek medical help that day, however she did see her primary care the next day when she was complaining of severe headache, appeared to be confused and disoriented and did not recognize the Dr. CT of the head showed no acute abnormality. She has since had an MRI of the brain which was read as normal as well as an EEG that was normal.  She returns today continuing to complain with short-term memory, attention, and  concentration difficulties but she continues to work and she has not had any difficulty performing her job duties.  She says she manages by using sticky notes, so that she will not forget things.  She denies any depression ,she is trying to exercise and has lost weight since last seen. She does complain of early morning headaches and fatigue, does not feel rested after sleeping although that is better with Trazodone.  She is currently on Depakote 500  daily 2 daily for headaches.Her headaches have returned since she is back to teaching after being off for the summer. She says these are stress related. She does not want to increase medication.    There is no nausea, photophobia, with the headaches. 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  Fell several weeks ago and hit the back of the head. No obvious injury.  See ROS  07/18/12: Returns for followup. MMSE stable. Went off Depakote for a few weeks and headaches returned and mood was terrible. Her PCP gave her a RX to get back on the Depakote. Headaches are stable at present. Continues to work with out problems with performing job. Had hernia  repair in March and complication of PE after surgery. Has just gotten off coumadin. No new complaints   REVIEW OF SYSTEMS: Full 14 system review of systems performed and notable only for those listed, all others are neg:  Constitutional: neg  Cardiovascular: neg Ear/Nose/Throat: neg  Skin: neg Eyes: neg Respiratory: neg Gastroitestinal: neg  Hematology/Lymphatic: neg  Endocrine: neg Musculoskeletal:neg Allergy/Immunology: neg Neurological:  headaches, imbalance, word finding difficulty, memory loss Psychiatric: neg Sleep : Insomnia   ALLERGIES: Allergies  Allergen Reactions   Aleve [Naproxen Sodium] Hives, Itching and Swelling   Aromasin [Exemestane] Anaphylaxis, Itching and Swelling    Pt currently tolerating medication well (02/03/16)-gwd; Pt stated "I can take this medication for a few months at a time and I take a benadryl when I feel it starting to come on" Patient has facial swelling, itching, and throat swelling  07/12/17  Taking this right now and no problem. sy/rn   Letrozole Shortness Of Breath, Rash and Other (See Comments)    Swelling - feet, eyes,hands;   Speech garbled   Levofloxacin Rash and Swelling    Facial swelling and red facial rash   Penicillins Hives, Swelling and Rash    All over the body. Has patient had a PCN reaction causing immediate rash, facial/tongue/throat swelling, SOB or lightheadedness with hypotension: Yes Has patient had a PCN reaction causing severe rash involving mucus membranes or skin necrosis: No Has patient had a PCN reaction that required hospitalization No Has patient had a PCN reaction occurring within the last 10 years: No If all of the above answers are "NO", then may proceed with Cephalosporin use.    Symbicort [Budesonide-Formoterol Fumarate] Anaphylaxis, Hives, Swelling and Rash   Codeine Other (See Comments)    hallucinations   Percocet [Oxycodone-Acetaminophen] Other (See Comments)    Causes syncope day after medication  has been taken.   Vicodin [Hydrocodone-Acetaminophen] Other (See Comments)    Causes syncope day after medication has been taken.   Doxycycline Swelling    Must with  antihistamine   Ezetimibe Other (See Comments)    Myalgias,fatigue   Prednisone Other (See Comments)    Can take with antihistamine/ can take the shot without antihistamine   Cefdinir Rash and Swelling   Losartan Rash    HOME MEDICATIONS: Outpatient Medications Prior to Visit  Medication Sig Dispense Refill   albuterol (VENTOLIN HFA) 108 (90 Base) MCG/ACT inhaler Inhale 2 puffs into the lungs every 6 (six) hours as needed for wheezing or shortness of breath.      ALPRAZolam (XANAX) 0.5 MG tablet Take 0.5 mg by mouth at bedtime as needed for sleep.      amLODipine (NORVASC) 5 MG tablet Take 1 tablet by mouth daily.     amphetamine-dextroamphetamine (ADDERALL) 20 MG tablet  Take 20 mg by mouth See admin instructions. Take 59m in the morning and 249min the afternoon.     cetirizine (ZYRTEC) 10 MG tablet Take by mouth.     chlorthalidone (HYGROTON) 25 MG tablet Take 1 tablet (25 mg total) by mouth daily. 90 tablet 1   omeprazole (PRILOSEC) 40 MG capsule TAKE 1 CAPSULE BY MOUTH DAILY (Patient taking differently: Take 40 mg by mouth daily.) 30 capsule 0   oxybutynin (DITROPAN-XL) 10 MG 24 hr tablet Take 10 mg by mouth daily.   0   potassium chloride (KLOR-CON) 10 MEQ tablet Take 1 tablet (10 mEq total) by mouth daily. 90 tablet 1   REPATHA SURECLICK 14947G/ML SOAJ INJECT 1 DOSE INTO THE SKIN EVERY 14 DAYS 2 mL 11   thyroid (ARMOUR) 90 MG tablet 1 tablet on an empty stomach     divalproex (DEPAKOTE ER) 500 MG 24 hr tablet Take 1 tablet (500 mg total) by mouth at bedtime. (Patient taking differently: Take 500 mg by mouth daily.) 90 tablet 3   fexofenadine (ALLEGRA) 60 MG tablet Take 60 mg by mouth daily.      Facility-Administered Medications Prior to Visit  Medication Dose Route Frequency Provider Last Rate Last Admin    fluticasone (FLOVENT HFA) 44 MCG/ACT inhaler 2 puff  2 puff Inhalation BID SoChesley MiresMD        PAST MEDICAL HISTORY: Past Medical History:  Diagnosis Date   Abdominal pain    Anxiety    Arthritis    Asthma    [place]   left upper inner   Cancer ([facility]   breast   Diabetes mellitus without complication ([facility]   Takes Tradjenta   Family history of adverse reaction to anesthesia    Patients mother had to be resusciated after having anesthesia   GERD (gastroesophageal reflux disease)    Headache(784.0)    Takes Depakote   Hernia, incisional periumbilical, incarcerated 1/0/96/2836 Hot flashes    Hx of radiation therapy 09/09/13- 10/13/13   left breast 5000 cGy in 25 sessions, no boost   Hyperlipidemia    Hypertension    PCP DR StNolon Rod Hypothyroidism    Non-alcoholic fatty liver disease    Peripheral vascular disease ([facility]   pulmonary embolis-still have some   Post concussion syndrome    4 yrs ago- sees [facility]- Dr SeLeonie Manvery 6 months-   has sleep study 2011 following accident but was neg per patient-  short term memory issues, dates and times   Pulmonary embolism ([facility]   Recurrent upper respiratory infection (URI)    FLU 08/23/11-08/29/11 then URI 08/29/11- 09/08/11- S/P zpack and steroids-  improved      no cough or congestion   Thyroid disease    Thyroid mass, left inferior lobe, 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  Dr. MaHassell Done BREAST BIOPSY     BREAST LUMPECTOMY Left 2015   radiation   BREAST LUMPECTOMY WITH NEEDLE LOCALIZATION AND AXILLARY SENTINEL LYMPH NODE BX Left 08/04/2013    Procedure: BREAST LUMPECTOMY WITH NEEDLE LOCALIZATION AND AXILLARY SENTINEL  LYMPH NODE Biopsy x 3;  Surgeon: Rolm Bookbinder, MD;  Location: [facility];  Service: General;  Laterality: Left;   BREAST SURGERY     CARPAL TUNNEL RELEASE  10/2009-12/2010   left - right   CESAREAN SECTION  X 2   COLONOSCOPY     DIAGNOSTIC LAPAROSCOPY     Lap. chole., appendectomy, hernia repair   HERNIA REPAIR  09/15/11   Ventral w/mesh   JOINT REPLACEMENT     KNEE ARTHROPLASTY Right 12/22/2015   Procedure: COMPUTER ASSISTED TOTAL KNEE ARTHROPLASTY;  Surgeon: Marybelle Killings, MD;  Location: [facility];  Service: Orthopedics;  Laterality: Right;   LAPAROSCOPIC CHOLECYSTECTOMY  2006   Dr. Gean Maidens TUNNEL RELEASE Bilateral    THYROIDECTOMY, PARTIAL     VENTRAL HERNIA REPAIR  09/29/2011   Procedure: LAPAROSCOPIC VENTRAL HERNIA;  Surgeon: Adin Hector, MD;  Location: [facility];  Service: General;  Laterality: N/A;  Laparoscopic Ventral Wall Hernia Repair with Mesh    FAMILY HISTORY: Family History  Problem Relation Age of Onset   Malignant hyperthermia Mother    Heart attack Father    Stroke Father    Diabetes Other        Grandmother   Other Other        respiratory- Grandmother   Cancer Maternal Grandmother 85       breast   Breast cancer Maternal Grandmother     SOCIAL HISTORY: Social History   Socioeconomic History   Marital status: Divorced    Spouse name: Not on file   Number of children: 2   Years of education: Master's   Highest education level: Not on file  Occupational History    Employer: [employer]  Tobacco Use   Smoking status: Never   Smokeless tobacco: Never  Vaping Use   Vaping Use: Never used  Substance and Sexual Activity   Alcohol use: No    Alcohol/week: 1.0 standard drink    Types: 1 Glasses of wine per week   Drug use: No   Sexual activity: Yes    Comment: Menarche age 63, first live birth age 65,  [place] P2. She stopped having periods approximately 1999 and took HRT  until 2005.  Other Topics Concern   Not on file  Social History Narrative   Patient is divorced and lives alone.   Patient has two children.   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   Patient has a Master's degree.   Patient drinks 3-4 cups of caffeine daily.    Patient is right handed.   Social Determinants of Health   Financial Resource Strain: Not on file  Food Insecurity: Not on file  Transportation Needs: Not on file  Physical Activity: Not on file  Stress: Not on file  Social Connections: Not on file  Intimate Partner Violence: Not on file     PHYSICAL EXAM  Vitals:   02/28/21 0858  BP: (!) 144/88  Pulse: 76  Weight: 218 lb (98.9 kg)  Height: 5' 2"  (1.575 m)    Body mass index is 39.87 kg/m.  Generalized: Well developed, pleasant pleasant middle-aged female in no acute distress   Head: normocephalic and atraumatic,. Oropharynx benign   Neck: Cervical collar in place s/p cervical fusion Musculoskeletal: No deformity   Neurological examination  Mentation: Awake and alert.  Follows all commands.  Speech and language fluent.  Short-term memory impaired and long-term memory intact.  Attention and fund of knowledge  appropriate. Cranial Nerves: Pupils equal, briskly reactive to light. Extraocular movements full without nystagmus. Visual fields full to confrontation. Hearing intact. Facial sensation intact. Face, tongue, palate moves normally and symmetrically.  Motor: Normal bulk and tone. Normal strength in all tested extremity muscles. Sensory.: intact to touch , pinprick , position and vibratory sensation.  Coordination: Rapid alternating movements normal in all extremities. Finger-to-nose and heel-to-shin performed accurately bilaterally. Gait and Station: Arises from chair without difficulty. Stance is normal. Gait demonstrates normal stride length with mild imbalance without use of assistive device Reflexes: 1+ and symmetric. Toes downgoing.         ASSESSMENT AND PLAN  70 y.o. year old female with postconcussive syndrome and 2 week onset of difficulties with word finding, worsening headaches and fatigue. 1 month onset of imbalance     Imbalance and aphasia: Obtain MR brain to rule out structural abnormalities contributing to new onset symptoms especially with prior history of breast cancer Patient believes possible BP med side effect -plans on further discussing with Dr. Debara Pickett May benefit from PT for balance therapies if continues especially with increased falls - she wishes to further discuss with Dr. Debara Pickett   Postconcussive syndrome:  Headaches:  Worsening over the past month currently experiencing daily Recommend starting amitriptyline 25 mg nightly then increase to 50 mg nightly after 2 weeks Discontinue Depakote due to lack of benefit and elevated liver enzymes Obtain MR brain w/wo contrast for worsening headaches Mild cognitive impairment - stable    Follow-up in 3 months or call earlier if needed  CC:  [facility] provider: Dr. Anna Genre, Earnest Conroy, MD    I spent 36 minutes of face-to-face and non-face-to-face time with patient.  This included previsit chart review, lab review, study review, order entry, electronic health record documentation, patient education and therapeutic management regarding below diagnosis:  Postconcussion syndrome - Plan: amitriptyline (ELAVIL) 25 MG tablet, amitriptyline (ELAVIL) 50 MG tablet, MR BRAIN W WO CONTRAST  New persistent daily headache - Plan: MR BRAIN W WO CONTRAST  Imbalance - Plan: MR BRAIN W WO CONTRAST   Frann Rider, AGNP-BC  [facility] [address] [place], [place] [address]  Phone [phone] Fax [phone] Note: This document was prepared with digital dictation and possible smart phrase technology. Any transcriptional errors that result from this process are unintentional.

## 2021-02-28 NOTE — Patient Instructions (Addendum)
Your Plan:  Stop depakote and start amitriptyline 75m nightly then increase to 556mnightly after 2 weeks - for headache management  Would recommend obtaining brain imaging due to new onset symptoms and worsening headaches   Please follow up with Dr. HiDebara Pickettegarding medication concerns     Follow up in 3 months or call earlier if needed      [facility]. I hope we have been able to provide you high quality care today.  You may receive a patient satisfaction survey over the next few weeks. We would appreciate your feedback and comments so that we may continue to improve ourselves and the health of our patients.    Amitriptyline Tablets What is this medication? AMITRIPTYLINE (a mee TRIP ti leen) treats depression. It increases the amount of serotonin and norepinephrine in the brain, hormones that help regulate mood.It belongs to a group of medications called tricyclic antidepressants (TCAs). This medicine may be used for other purposes; ask your health care provider orpharmacist if you have questions. COMMON BRAND NAME(S): Elavil, Vanatrip What should I tell my care team before I take this medication? They need to know if you have any of these conditions: An alcohol problem Asthma, trouble breathing Bipolar disorder or schizophrenia Difficulty passing urine, prostate trouble Glaucoma Heart disease or previous heart attack Liver disease Over active thyroid Seizures Thoughts or plans of suicide, a previous suicide attempt, or family history of suicide attempt An unusual or allergic reaction to amitriptyline, other medications, foods, dyes, or preservatives Pregnant or trying to get pregnant Breast-feeding How should I use this medication? Take this medication by mouth with a drink of water. Follow the directions on the prescription label. You can take the tablets with or without food. Take your medication at regular  intervals. Do not take it more often than directed. Do not stop taking this medication suddenly except upon the advice of your care team. Stopping this medication too quickly may cause serious side effects oryour condition may worsen. A special MedGuide will be given to you by the pharmacist with eachprescription and refill. Be sure to read this information carefully each time. Talk to your care team regarding the use of this medication in children.Special care may be needed. Overdosage: If you think you have taken too much of this medicine contact apoison control center or emergency room at once. NOTE: This medicine is only for you. Do not share this medicine with others. What if I miss a dose? If you miss a dose, take it as soon as you can. If it is almost time for yournext dose, take only that dose. Do not take double or extra doses. What may interact with this medication? Do not take this medication with any of the following: Arsenic trioxide Certain medications used to regulate abnormal heartbeat or to treat other heart conditions Cisapride Droperidol Halofantrine Linezolid MAOIs like Carbex, Eldepryl, Marplan, Nardil, and Parnate Methylene blue Other medications for mental depression Phenothiazines like perphenazine, thioridazine and chlorpromazine Pimozide Probucol Procarbazine Sparfloxacin St. John's Wort This medication may also interact with the following: Atropine and related medications like hyoscyamine, scopolamine, tolterodine and others Barbiturate medications for inducing sleep or treating seizures, like phenobarbital Cimetidine Disulfiram Ethchlorvynol Thyroid hormones such as levothyroxine Ziprasidone This list may not describe all possible interactions. Give your health care provider a list of all the medicines, herbs, non-prescription drugs, or dietary supplements you use. Also tell them if you smoke, drink alcohol,  or use illegaldrugs. Some items may interact with  your medicine. What should I watch for while using this medication? Tell your care team if your symptoms do not get better or if they get worse. Visit your care team for regular checks on your progress. Because it may take several weeks to see the full effects of this medication, it is important tocontinue your treatment as prescribed by your care team. Patients and their families should watch out for new or worsening thoughts of suicide or depression. Also watch out for sudden changes in feelings such as feeling anxious, agitated, panicky, irritable, hostile, aggressive, impulsive, severely restless, overly excited and hyperactive, or not being able to sleep. If this happens, especially at the beginning of treatment or after a change indose, call your care team. You may get drowsy or dizzy. Do not drive, use machinery, or do anything that needs mental alertness until you know how this medication affects you. Do not stand or sit up quickly, especially if you are an older patient. This reduces the risk of dizzy or fainting spells. Alcohol may interfere with the effect ofthis medication. Avoid alcoholic drinks. Do not treat yourself for coughs, colds, or allergies without asking your careteam for advice. Some ingredients can increase possible side effects. Your mouth may get dry. Chewing sugarless gum or sucking hard candy, and drinking plenty of water will help. Contact your care team if the problem doesnot go away or is severe. This medication may cause dry eyes and blurred vision. If you wear contact lenses you may feel some discomfort. Lubricating drops may help. See your eyedoctor if the problem does not go away or is severe. This medication can cause constipation. Try to have a bowel movement at least every 2 to 3 days. If you do not have a bowel movement for 3 days, call yourcare team. This medication can make you more sensitive to the sun. Keep out of the sun. If you cannot avoid being in the sun,  wear protective clothing and use sunscreen.Do not use sun lamps or tanning beds/booths. What side effects may I notice from receiving this medication? Side effects that you should report to your care team as soon as possible: Allergic reactions-skin rash, itching, hives, swelling of the face, lips, tongue, or throat Heart rhythm changes- fast or irregular heartbeat, dizziness, feeling faint or lightheaded, chest pain, trouble breathing Serotonin syndrome-irritability, confusion, fast or irregular heartbeat, muscle stiffness, twitching muscles, sweating, high fever, seizure, chills, vomiting, diarrhea Sudden eye pain or change in vision such as blurry vision, seeing halos around lights, vision loss Thoughts of suicide or self-harm, worsening mood, feelings of depression Side effects that usually do not require medical attention (report to your careteam if they continue or are bothersome): Change in appetite or weight Change in sex drive or performance Constipation Dizziness Drowsiness Dry mouth Tremors This list may not describe all possible side effects. Call your doctor for medical advice about side effects. You may report side effects to FDA at1-800-FDA-1088. Where should I keep my medication? Keep out of the reach of children and pets. Store at room temperature between 20 and 25 degrees C (68 and 77 degrees F).Throw away any unused medication after the expiration date. NOTE: This sheet is a summary. It may not cover all possible information. If you have questions about this medicine, talk to your doctor, pharmacist, orhealth care provider.  2022 [employer] Standard (2020-08-06 13:09:40)

## 2021-03-01 ENCOUNTER — Telehealth: Payer: Self-pay | Admitting: Adult Health

## 2021-03-01 NOTE — Telephone Encounter (Signed)
[employer] pending uploaded notes on the portal

## 2021-03-01 NOTE — Progress Notes (Signed)
Yes .. advise her to stop the chlorthalidone and see if her symptoms improve - if not within 1 week, then restart medicine.  Dr. Lemmie Evens

## 2021-03-02 ENCOUNTER — Telehealth: Payer: Self-pay | Admitting: Internal Medicine

## 2021-03-02 NOTE — Telephone Encounter (Signed)
Left message to call back regarding:  Dr. Debara Pickett was contacted by J. McCue NP  Pertaining to their conversation, he recommended the following:  Author: Pixie Casino, MD Service: Cardiology Author Type: Physician  Filed: 03/01/2021 10:42 AM Encounter Date: 02/28/2021 Status: Signed  Editor: Pixie Casino, MD (Physician)          Yes .. advise her to stop the chlorthalidone and see if her symptoms improve - if not within 1 week, then restart medicine.   Dr. Lemmie Evens

## 2021-03-03 ENCOUNTER — Encounter: Payer: Self-pay | Admitting: Adult Health

## 2021-03-03 NOTE — Telephone Encounter (Signed)
Spoke with the patient. She stated that she had received that message and was already off the chlorthalidone and she was feeling better.

## 2021-03-03 NOTE — Telephone Encounter (Signed)
Pt is returning a call

## 2021-03-03 NOTE — Telephone Encounter (Signed)
Do you recommend pt stop taking ? Switch to something different ?

## 2021-03-03 NOTE — Telephone Encounter (Signed)
Stop amitriptyline in case this is an allergic side effect. Okay to take benadryl to help symptoms. We will hold off on doing anything any other medications for now

## 2021-03-03 NOTE — Telephone Encounter (Signed)
Alyssa [name] Alyssa [name]: [id number] (exp. 03/01/21 to 03/31/21) order sent to [facility]. They will reach out to the patient to schedule.

## 2021-03-07 NOTE — Progress Notes (Signed)
I agree with the above plan 

## 2021-03-11 ENCOUNTER — Ambulatory Visit
Admission: RE | Admit: 2021-03-11 | Discharge: 2021-03-11 | Disposition: A | Discharge status: Home / Self Care | Payer: Medicare PPO | Source: Ambulatory Visit | Attending: Adult Health | Admitting: Adult Health

## 2021-03-11 DIAGNOSIS — G4452 New daily persistent headache (NDPH): Secondary | ICD-10-CM

## 2021-03-11 DIAGNOSIS — F0781 Postconcussional syndrome: Secondary | ICD-10-CM

## 2021-03-11 MED ORDER — GADOBENATE DIMEGLUMINE 529 MG/ML IV SOLN
20.0000 mL | Freq: Once | INTRAVENOUS | Status: AC | PRN
  Administered 2021-03-11: 20 mL via INTRAVENOUS

## 2021-04-27 ENCOUNTER — Ambulatory Visit: Payer: Medicare PPO | Admitting: Neurology

## 2021-04-27 ENCOUNTER — Encounter: Payer: Self-pay | Admitting: Neurology

## 2021-04-27 ENCOUNTER — Other Ambulatory Visit: Payer: Self-pay

## 2021-04-27 VITALS — BP 141/88 | HR 83 | Ht 62.0 in | Wt 215.6 lb

## 2021-04-27 DIAGNOSIS — G44209 Tension-type headache, unspecified, not intractable: Secondary | ICD-10-CM | POA: Diagnosis not present

## 2021-04-27 DIAGNOSIS — R42 Dizziness and giddiness: Secondary | ICD-10-CM | POA: Diagnosis not present

## 2021-04-27 NOTE — Progress Notes (Signed)
[facility]  PATIENT: Alyssa [name] DOB: 12-Feb-1951   REASON FOR VISIT: Follow-up postconcussion syndrome HISTORY FROM: Patient   Chief complaint: Headaches   HISTORY OF PRESENT ILLNESS:   Today, 02/28/2021, Alyssa [name] returns for routine 65-monthfollow-up.  Since prior visit, she reports having daily headaches as well as fatigue, imbalance and difficulty with word recall over the past 2 weeks - questions possible medication side effect to new BP meds started approx 1 month ago (added chlorthalidone and lowered amlodipine dose to 532mdue to edema). Plans to discuss further with Dr. HiDebara Pickett Does report improvement in her blood pressure - today 144/88 previously SBPs 180-200s.  Reports some issues with speech initially after head injury which improved.  Cognition has been stable and continues to use compensation strategies although she is concerned regarding possible progression.  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 Reports 2 falls thankfully without injury.  Generalized headache present daily which lasts all day. Can increase in severity at times and will take Tylenol with only limited benefit.  Denies photophobia or phonophobia.  Continues on Depakote but concerned regarding increase liver enzymes (AST 84, ALT 124).  No further concerns at this time.       History provided for reference purposes only Update 08/25/2020 JM: Ms. WiSmaltzeturns for 6-5-monthllow-up with history of postconcussion syndrome.  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 She has remained on depakote ER 500m20mghtly but reports since November she has been experiencing occasional headaches upon awakening. She will take tylenol with occasional benefit.  She also reports insomnia.  Reports previously undergoing sleep study  approximately 10 years ago and was negative for sleep apnea.  She denies snoring or any witnessed apnea.  She has been having difficulty with blood pressure control and PCP recently switched hydrochlorothiazide to losartan.  Blood pressure today 168/92 -occasionally monitors at home.  PCP is also working her up for elevated liver enzymes and has scheduled ultrasound 2/7.  No further concerns at this time.  Update 02/23/2020 JM: Ms. WilsHaiderurns for postconcussion syndrome with memory loss and headaches.  Continues on Depakote ER 500 mg nightly with benefit of headache management without side effects.  Memory loss has been stable without worsening.  Since prior visit, underwent uncomplicated cervical spinal fusion on 01/16/2020.  Also started on Adderall by PCP for possible ADHD and mood swings with great improvement.  No concerns at this time.  Update 08/26/2019 JM: Alyssa [name] 68 y32r old female who is being seen today for 1 year follow-up regarding postconcussion syndrome with memory loss and headaches.  At prior visit, she was doing well in regards to stable headaches and memory.  She endorses worsening headaches since the beginning of December which are generalized associated with photophobia and phonophobia with similar characteristics compared to her prior headaches.  She has been using Tylenol 1000 mg twice daily with only mild benefit.  She does endorse increased stress as she is a high school special ed teacher and had difficulty with the virtual learning as she felt "isolated".  She has now since returned to in class learning and has been feeling better since that time but she continues to experience short-term memory loss and is concerned regarding overall worsening memory. MMSE today 29/30.  She continues to use Depakote  DR 500 mg nightly but previously on ER formulation.  She denies depression or anxiety and denies any history or family history.  She does endorse increased stress.  Recently started  on Repatha by cardiology with excellent improvement of cholesterol levels.  Blood pressure today satisfactory at 132/84.  No further concerns at this time.  UPDATE 1/2/2020[name], 70 year old female returns for follow-up with history of memory loss postconcussion syndrome and headaches.  She is currently on Depakote 500 mg at night with good control of her headaches however she does wake up with some morning headaches occasionally.  She occasionally takes Tylenol with relief.  She denies any daytime drowsiness or snoring.  She claims she feels the best she has in a long time she is exercising.  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  She denies any issues with her job performance.  Her mother recently died.  She returns for reevaluation  Update 12/20/2018[name], 70 year old female returns for follow-up with history of headaches and postconcussion syndrome and memory loss.  For her headaches she is taking Depakote but decided to taper herself off of the medication back in the summer, her headaches returned and she resumed the Depakote.  Her headaches are now in good control her memory loss is stable per MMSE.  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  She has not had no issues performing her job.  She returns for reevaluation and refills  UPDATE 07/19/16 [name], 70 year old female returns for yearly followup.  Patient fell off of the chair at school back in June2015 and hit her head. CT of the head at that time without abnormalities her headaches returned and she started taking her Depakote again with good benefit. Her memory is stable per MMSE. 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  She has not had any issues performing  her job.  She had breast cancer surgery January 2015. She had knee replacement on the right 12/22/2015 She returns for  reevaluation and refills  HISTORY: of cognitive changes post  head injury. She was initially evaluated by Dr. Leonie Man in September of 2009. She was operating a push mower when she was going down a ditch in front of her home and  toppled over the handle and hit her head on the motor of the mower.  At that time she was not sure she loss consciousness or not,she was able to get up go indoors. She did not seek medical help that day, however she did see her primary care the next day when she was complaining of severe headache, appeared to be confused and disoriented and did not recognize the Dr. CT of the head showed no acute abnormality. She has since had an MRI of the brain which was read as normal as well as an EEG that was normal.  She returns today continuing to complain with short-term memory, attention, and  concentration difficulties but she continues to work and she has not had any difficulty performing her job duties.  She says she manages by using sticky notes, so that she will not forget things.  She denies any depression ,she is trying to exercise and has lost weight since last seen. She does complain of early morning headaches and fatigue, does not feel rested after sleeping although that is better with Trazodone.  She is currently on Depakote 500  daily 2 daily for headaches.Her headaches have returned since she is back to teaching after being off for the summer. She says these are stress related. She does not want to increase medication.    There is no nausea, photophobia, with the headaches. 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  Fell several weeks ago and hit the back of the head. No obvious injury.  See ROS  07/18/12: Returns for followup. MMSE stable. Went off Depakote for a few weeks and headaches returned and mood was terrible. Her PCP gave her a RX to get back on the Depakote. Headaches are stable at present. Continues to work with out problems with performing job. Had hernia  repair in March and complication of PE after surgery. Has just gotten off coumadin. No new complaints Update 04/27/2021 : She returns for follow-up after last visit 2 months ago with [place] practitioner.  Patient did not tolerate amitriptyline as she felt disoriented and had brain fog and discontinued it.  She has also stopped Depakote more than a month ago with headaches in fact seem to be improving.  Headaches are not as frequent and occur rarely once a week or so she is scared to take any medicines.  Headaches are moderate in severity and generalized aching in nature with some posterior neck tightness.  Headache is not accompanied by nausea, vomiting, light or sound sensitivity or any visual symptoms.  Patient feels her balance is also better she has had no falls or injuries.  She had MRI scan of the brain done on 03/11/2021 which I personally reviewed shows mild changes of chronic small vessel disease and a partially empty sella.  No acute abnormalities noted.  She had cholesterol profile on 02/22/2021 which showed elevated total cholesterol of 250, LDL of 184 and low HDL of 49.  Hemoglobin A1c was 6.1.  Her cardiologist Dr. Debara Pickett has started her on Repatha injections which she is tolerating well without any side effects.  She has no new complaints today.  REVIEW OF SYSTEMS: Full 14 system review of systems performed and notable only for those listed, all others are neg:     ALLERGIES: Allergies  Allergen Reactions   Aleve [Naproxen Sodium] Hives, Itching and Swelling   Aromasin [Exemestane] Anaphylaxis, Itching and Swelling    Pt currently tolerating medication well (02/03/16)-gwd; Pt stated "I can take this medication for a few months at a time and I take a benadryl when I feel it starting to come on" Patient has facial swelling, itching, and throat swelling  07/12/17  Taking this right now and no problem. sy/rn   Letrozole Shortness Of Breath, Rash and Other (See Comments)    Swelling - feet,  eyes,hands;   Speech garbled   Levofloxacin Rash and Swelling    Facial swelling and red facial rash   Penicillins Hives, Swelling and Rash    All over the body. Has patient had a PCN reaction causing immediate rash, facial/tongue/throat swelling, SOB or lightheadedness with hypotension: Yes Has patient had a PCN reaction causing severe rash involving mucus membranes or skin necrosis: No Has patient had a PCN reaction that required hospitalization No Has patient had a PCN reaction occurring within the last 10 years: No If all of the above answers are "NO", then may proceed with Cephalosporin use.    Symbicort [Budesonide-Formoterol Fumarate] Anaphylaxis, Hives, Swelling and Rash   Codeine Other (See Comments)    hallucinations   Percocet [Oxycodone-Acetaminophen] Other (See  Comments)    Causes syncope day after medication has been taken.   Vicodin [Hydrocodone-Acetaminophen] Other (See Comments)    Causes syncope day after medication has been taken.   Doxycycline Swelling    Must with  antihistamine   Ezetimibe Other (See Comments)    Myalgias,fatigue   Prednisone Other (See Comments)    Can take with antihistamine/ can take the shot without antihistamine   Cefdinir Rash and Swelling   Losartan Rash    HOME MEDICATIONS: Outpatient Medications Prior to Visit  Medication Sig Dispense Refill   albuterol (VENTOLIN HFA) 108 (90 Base) MCG/ACT inhaler Inhale 2 puffs into the lungs every 6 (six) hours as needed for wheezing or shortness of breath.      ALPRAZolam (XANAX) 0.5 MG tablet Take 0.5 mg by mouth at bedtime as needed for sleep.      amitriptyline (ELAVIL) 50 MG tablet Take 1 tablet (50 mg total) by mouth at bedtime. 30 tablet 5   amLODipine (NORVASC) 5 MG tablet Take 1 tablet by mouth daily.     amphetamine-dextroamphetamine (ADDERALL) 20 MG tablet Take 20 mg by mouth See admin instructions. Take 16m in the morning and 259min the afternoon.     cetirizine (ZYRTEC) 10 MG tablet  Take by mouth.     clindamycin (CLEOCIN) 300 MG capsule Take 300 mg by mouth 3 (three) times daily.     omeprazole (PRILOSEC) 40 MG capsule TAKE 1 CAPSULE BY MOUTH DAILY 30 capsule 0   oxybutynin (DITROPAN-XL) 10 MG 24 hr tablet Take 10 mg by mouth daily.   0   potassium chloride (KLOR-CON) 10 MEQ tablet Take 1 tablet (10 mEq total) by mouth daily. 90 tablet 1   REPATHA SURECLICK 14233G/ML SOAJ INJECT 1 DOSE INTO THE SKIN EVERY 14 DAYS 2 mL 11   Semaglutide (OZEMPIC, 0.25 OR 0.5 MG/DOSE, [place]) Inject into the skin.     thyroid (ARMOUR) 90 MG tablet 1 tablet on an empty stomach     amitriptyline (ELAVIL) 25 MG tablet Take 1 tablet (25 mg total) by mouth at bedtime for 14 days, THEN 2 tablets (50 mg total) at bedtime for 16 days. 46 tablet 0   chlorthalidone (HYGROTON) 25 MG tablet Take 1 tablet (25 mg total) by mouth daily. 90 tablet 1   Facility-Administered Medications Prior to Visit  Medication Dose Route Frequency Provider Last Rate Last Admin   fluticasone (FLOVENT HFA) 44 MCG/ACT inhaler 2 puff  2 puff Inhalation BID SoChesley MiresMD        PAST MEDICAL HISTORY: Past Medical History:  Diagnosis Date   Abdominal pain    Anxiety    Arthritis    Asthma    [facility]   [facility] upper inner   [facility] ([facility]   [facility]ast   Diabetes mellitus without complication ([facility]   Takes Tradjenta   Family history of adverse reaction to anesthesia    Patients mother had to be resusciated after having anesthesia   GERD (gastroesophageal reflux disease)    Headache(784.0)    Takes Depakote   Hernia, incisional periumbilical, incarcerated 1/0/01/6225 Hot flashes    Hx of radiation therapy 09/09/13- 10/13/13   left breast 5000 cGy in 25 sessions, no boost   Hyperlipidemia    Hypertension    PCP DR StNolon Rod Hypothyroidism    Non-alcoholic fatty liver disease    Peripheral vascular disease ([facility]   pulmonary embolis-still have some  Post concussion syndrome    4 yrs ago- sees  [facility]- Dr Leonie Man every 6 months-   has sleep study 2011 following accident but was neg per patient-  short term memory issues, dates and times   Pulmonary embolism ([place]) 2013   Recurrent upper respiratory infection (URI)    FLU 08/23/11-08/29/11 then URI 08/29/11- 09/08/11- S/P zpack and steroids-  improved      no cough or congestion   Thyroid disease    Thyroid mass, left inferior lobe, 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  Dr. Hassell Done   BREAST BIOPSY     BREAST LUMPECTOMY Left 2015   radiation   BREAST LUMPECTOMY WITH NEEDLE LOCALIZATION AND AXILLARY SENTINEL LYMPH NODE BX Left 08/04/2013   Procedure: BREAST LUMPECTOMY WITH NEEDLE LOCALIZATION AND AXILLARY SENTINEL LYMPH NODE Biopsy x 3;  Surgeon: Rolm Bookbinder, MD;  Location: [facility];  Service: General;  Laterality: Left;   BREAST SURGERY     CARPAL TUNNEL RELEASE  10/2009-12/2010   left - right   CESAREAN SECTION  X 2   COLONOSCOPY     DIAGNOSTIC LAPAROSCOPY     Lap. chole., appendectomy, hernia repair   HERNIA REPAIR  09/15/11   Ventral w/mesh   JOINT REPLACEMENT     KNEE ARTHROPLASTY Right 12/22/2015   Procedure: COMPUTER ASSISTED TOTAL KNEE ARTHROPLASTY;  Surgeon: Marybelle Killings, MD;  Location: [facility];  Service: Orthopedics;  Laterality: Right;   LAPAROSCOPIC CHOLECYSTECTOMY  2006   Dr. Gean Maidens TUNNEL RELEASE Bilateral    THYROIDECTOMY, PARTIAL     VENTRAL HERNIA REPAIR  09/29/2011   Procedure: LAPAROSCOPIC VENTRAL HERNIA;  Surgeon: Adin Hector, MD;  Location: [facility];  Service: General;  Laterality: N/A;  Laparoscopic Ventral Wall Hernia Repair with Mesh     FAMILY HISTORY: Family History  Problem Relation Age of Onset   Malignant hyperthermia Mother    Heart attack Father    Stroke Father    Diabetes Other        Grandmother   Other Other        respiratory- Grandmother   Cancer Maternal Grandmother 85       breast   Breast cancer Maternal Grandmother     SOCIAL HISTORY: Social History   Socioeconomic History   Marital status: Divorced    Spouse name: Not on file   Number of children: 2   Years of education: Master's   Highest education level: Not on file  Occupational History    Employer: [employer]  Tobacco Use   Smoking status: Never   Smokeless tobacco: Never  Vaping Use   Vaping Use: Never used  Substance and Sexual Activity   Alcohol use: No    Alcohol/week: 1.0 standard drink    Types: 1 Glasses of wine per week   Drug use: No   Sexual activity: Yes    Comment: Menarche age 25, first live birth age 58,  [place] P2. She stopped having periods approximately 1999 and took HRT until 2005.  Other Topics Concern   Not on file  Social History Narrative   Patient is divorced  and lives alone.   Patient drinks 3-4 cups of caffeine daily.    Patient is right handed.   Social Determinants of Health   Financial Resource Strain: Not on file  Food Insecurity: Not on file  Transportation Needs: Not on file  Physical Activity: Not on file  Stress: Not on file  Social Connections: Not on file  Intimate Partner Violence: Not on file     PHYSICAL EXAM  Vitals:   04/27/21 1322  BP: (!) 141/88  Pulse: 83  Weight: 215 lb 9.6 oz (97.8 kg)  Height: 5' 2"  (1.575 m)    Body mass index is 39.43 kg/m.  Generalized: Well developed, pleasant pleasant middle-aged female in no acute distress   Head: normocephalic and atraumatic,. Oropharynx benign   Neck: .  Mild tightness of suprascapular muscles with no limitation of movement. Musculoskeletal: No deformity   Neurological examination  Mentation: Awake and  alert.  Follows all commands.  Speech and language fluent.  Short-term memory impaired and long-term memory intact.  Attention and fund of knowledge appropriate. Cranial Nerves: Pupils equal, briskly reactive to light. Extraocular movements full without nystagmus. Visual fields full to confrontation. Hearing intact. Facial sensation intact. Face, tongue, palate moves normally and symmetrically.  Motor: Normal bulk and tone. Normal strength in all tested extremity muscles. Sensory.: intact to touch , pinprick , position and vibratory sensation.  Coordination: Rapid alternating movements normal in all extremities. Finger-to-nose and heel-to-shin performed accurately bilaterally. Gait and Station: Arises from chair without difficulty. Stance is normal. Gait demonstrates normal stride length with mild imbalance without use of assistive device Reflexes: 1+ and symmetric. Toes downgoing.        ASSESSMENT AND PLAN  70 y.o. year old female with postconcussive syndrome with intermittent dizziness, headache and imbalance which appear to be improving.  She still has mild tension headache but has had trouble tolerating amitriptyline and Depakote in the past    I had a long discussion the patient with her headache and dizziness which appears to have improved and discussed results of MRI scan and lab work and answered questions.  I recommend she increase participation in regular activities for stress relaxation like exercise, swimming, meditation and yoga as well as do neck stretching exercises.  I recommend she take migraine Excedrin for symptomatic relief of her headache and headache frequency is not frequent enough at the present time to justify trial of further medications and she has had trouble tolerating Depakote and amitriptyline recently.  She will return for follow-up in the future in 6 months with [place] practitioner on call earlier if necessary.  I spent 35 minutes of face-to-face and  non-face-to-face time with patient.  This included previsit chart review, lab review, study review, order entry, electronic health record documentation, patient education and therapeutic management regarding below diagnosis:  Tension headache  Dizziness  Antony Contras, MD  [facility] [address] [place] [address] [place], [place] [address]  Phone [phone] Fax [phone] Note: This document was prepared with digital dictation and possible smart phrase technology. Any transcriptional errors that result from this process are unintentional.

## 2021-04-27 NOTE — Patient Instructions (Signed)
I had a long discussion the patient with her headache and dizziness which appears to have improved and discussed results of MRI scan and lab work and answered questions.  I recommend she increase participation in regular activities for stress relaxation like exercise, swimming, meditation and yoga as well as do neck stretching exercises.  I recommend she take migraine Excedrin for symptomatic relief of her headache and headache frequency is not frequent enough at the present time to justify trial of further medications and she has had trouble tolerating Depakote and amitriptyline recently.  She will return for follow-up in the future in 6 months with [place] practitioner on call earlier if necessary. Tension Headache, Adult A tension headache is a feeling of pain, pressure, or aching over the front and sides of the head. The pain can be dull, or it can feel tight. There are two types of tension headache: Episodic tension headache. This is when the headaches happen fewer than 15 days a month. Chronic tension headache. This is when the headaches happen more than 15 days a month during a 69-monthperiod. A tension headache can last from 30 minutes to several days. It is the most common kind of headache. Tension headaches are not normally associated with nausea or vomiting, and they do not get worse with physical activity. What are the causes? The exact cause of this condition is not known. Tension headaches are often triggered by stress, anxiety, or depression. Other triggers may include: Alcohol. Too much caffeine or caffeine withdrawal. Respiratory infections, such as colds, flu, or sinus infections. Dental problems or teeth clenching. Fatigue. Holding your head and neck in the same position for a long period of time, such as while using a computer. Smoking. Arthritis of the neck. What are the signs or symptoms? Symptoms of this condition include: A feeling of pressure or tightness around the  head. Dull, aching head pain. Pain over the front and sides of the head. Tenderness in the muscles of the head, neck, and shoulders. How is this diagnosed? This condition may be diagnosed based on your symptoms, your medical history, and a physical exam. If your symptoms are severe or unusual, you may have imaging tests, such as a CT scan or an MRI of your head. Your vision may also be checked. How is this treated? This condition may be treated with lifestyle changes and with medicines that help relieve symptoms. Follow these instructions at home: Managing pain Take over-the-counter and prescription medicines only as told by your health care provider. When you have a headache, lie down in a dark, quiet room. If directed, put ice on your head and neck. To do this: Put ice in a plastic bag. Place a towel between your skin and the bag. Leave the ice on for 20 minutes, 2-3 times a day. Remove the ice if your skin turns bright red. This is very important. If you cannot feel pain, heat, or cold, you have a greater risk of damage to the area. If directed, apply heat to the back of your neck as often as told by your health care provider. Use the heat source that your health care provider recommends, such as a moist heat pack or a heating pad. Place a towel between your skin and the heat source. Leave the heat on for 20-30 minutes. Remove the heat if your skin turns bright red. This is especially important if you are unable to feel pain, heat, or cold. You have a greater risk of getting  burned. Eating and drinking Eat meals on a regular schedule. If you drink alcohol: Limit how much you have to: 0-1 drink a day for women who are not pregnant. 0-2 drinks a day for men. Know how much alcohol is in your drink. In the [place] one drink equals one 12 oz bottle of beer (355 mL), one 5 oz glass of wine (148 mL), or one 1 oz glass of hard liquor (44 mL). Drink enough fluid to keep your urine pale  yellow. Decrease your caffeine intake, or stop using caffeine. Lifestyle Get 7-9 hours of sleep each night, or get the amount of sleep recommended by your health care provider. At bedtime, remove computers, phones, and tablets from your room. Find ways to manage your stress. This may include: Exercise. Deep breathing exercises. Yoga. Listening to music. Positive mental imagery. Try to sit up straight and avoid tensing your muscles. Do not use any products that contain nicotine or tobacco. These include cigarettes, chewing tobacco, and vaping devices, such as e-cigarettes. If you need help quitting, ask your health care provider. General instructions  Avoid any headache triggers. Keep a journal to help find out what may trigger your headaches. For example, write down: What you eat and drink. How much sleep you get. Any change to your diet or medicines. Keep all follow-up visits. This is important. Contact a health care provider if: Your headache does not get better. Your headache comes back. You are sensitive to sounds, light, or smells because of a headache. You have nausea or you vomit. Your stomach hurts. Get help right away if: You suddenly develop a severe headache, along with any of the following: A stiff neck. Nausea and vomiting. Confusion. Weakness in one part or one side of your body. Double vision or loss of vision. Shortness of breath. Rash. Unusual sleepiness. Fever or chills. Trouble speaking. Pain in your eye or ear. Trouble walking or balancing. Feeling faint or passing out. Summary A tension headache is a feeling of pain, pressure, or aching over the front and sides of the head. A tension headache can last from 30 minutes to several days. It is the most common kind of headache. This condition may be diagnosed based on your symptoms, your medical history, and a physical exam. This condition may be treated with lifestyle changes and with medicines that help  relieve symptoms. This information is not intended to replace advice given to you by your health care provider. Make sure you discuss any questions you have with your health care provider. Document Revised: 04/08/2020 Document Reviewed: 04/08/2020 Elsevier Patient Education  2022 [employer]. Neck Exercises Ask your health care provider which exercises are safe for you. Do exercises exactly as told by your health care provider and adjust them as directed. It is normal to feel mild stretching, pulling, tightness, or discomfort as you do these exercises. Stop right away if you feel sudden pain or your pain gets worse. Do not begin these exercises until told by your health care provider. Neck exercises can be important for many reasons. They can improve strength and maintain flexibility in your neck, which will help your upper back and prevent neck pain. Stretching exercises Rotation neck stretching  Sit in a chair or stand up. Place your feet flat on the floor, shoulder width apart. Slowly turn your head (rotate) to the right until a slight stretch is felt. Turn it all the way to the right so you can look over your right shoulder. Do not  tilt or tip your head. Hold this position for 10-30 seconds. Slowly turn your head (rotate) to the left until a slight stretch is felt. Turn it all the way to the left so you can look over your left shoulder. Do not tilt or tip your head. Hold this position for 10-30 seconds. Repeat __________ times. Complete this exercise __________ times a day. Neck retraction Sit in a sturdy chair or stand up. Look straight ahead. Do not bend your neck. Use your fingers to push your chin backward (retraction). Do not bend your neck for this movement. Continue to face straight ahead. If you are doing the exercise properly, you will feel a slight sensation in your throat and a stretch at the back of your neck. Hold the stretch for 1-2 seconds. Repeat __________ times. Complete  this exercise __________ times a day. Strengthening exercises Neck press Lie on your back on a firm bed or on the floor with a pillow under your head. Use your neck muscles to push your head down on the pillow and straighten your spine. Hold the position as well as you can. Keep your head facing up (in a neutral position) and your chin tucked. Slowly count to 5 while holding this position. Repeat __________ times. Complete this exercise __________ times a day. Isometrics These are exercises in which you strengthen the muscles in your neck while keeping your neck still (isometrics). Sit in a supportive chair and place your hand on your forehead. Keep your head and face facing straight ahead. Do not flex or extend your neck while doing isometrics. Push forward with your head and neck while pushing back with your hand. Hold for 10 seconds. Do the sequence again, this time putting your hand against the back of your head. Use your head and neck to push backward against the hand pressure. Finally, do the same exercise on either side of your head, pushing sideways against the pressure of your hand. Repeat __________ times. Complete this exercise __________ times a day. Prone head lifts Lie face-down (prone position), resting on your elbows so that your chest and upper back are raised. Start with your head facing downward, near your chest. Position your chin either on or near your chest. Slowly lift your head upward. Lift until you are looking straight ahead. Then continue lifting your head as far back as you can comfortably stretch. Hold your head up for 5 seconds. Then slowly lower it to your starting position. Repeat __________ times. Complete this exercise __________ times a day. Supine head lifts Lie on your back (supine position), bending your knees to point to the ceiling and keeping your feet flat on the floor. Lift your head slowly off the floor, raising your chin toward your chest. Hold  for 5 seconds. Repeat __________ times. Complete this exercise __________ times a day. Scapular retraction Stand with your arms at your sides. Look straight ahead. Slowly pull both shoulders (scapulae) backward and downward (retraction) until you feel a stretch between your shoulder blades in your upper back. Hold for 10-30 seconds. Relax and repeat. Repeat __________ times. Complete this exercise __________ times a day. Contact a health care provider if: Your neck pain or discomfort gets much worse when you do an exercise. Your neck pain or discomfort does not improve within 2 hours after you exercise. If you have any of these problems, stop exercising right away. Do not do the exercises again unless your health care provider says that you can. Get help right away  if: You develop sudden, severe neck pain. If this happens, stop exercising right away. Do not do the exercises again unless your health care provider says that you can. This information is not intended to replace advice given to you by your health care provider. Make sure you discuss any questions you have with your health care provider. Document Revised: 05/08/2018 Document Reviewed: 05/08/2018 Elsevier Patient Education  2022 [employer].

## 2021-06-01 ENCOUNTER — Ambulatory Visit: Payer: Medicare PPO | Admitting: Adult Health

## 2021-06-23 ENCOUNTER — Ambulatory Visit: Payer: Medicare PPO | Admitting: Internal Medicine

## 2021-06-23 ENCOUNTER — Other Ambulatory Visit: Payer: Self-pay

## 2021-06-23 ENCOUNTER — Encounter: Payer: Self-pay | Admitting: Internal Medicine

## 2021-06-23 VITALS — BP 134/82 | HR 71 | Ht 62.0 in | Wt 210.4 lb

## 2021-06-23 DIAGNOSIS — I2584 Coronary atherosclerosis due to calcified coronary lesion: Secondary | ICD-10-CM

## 2021-06-23 DIAGNOSIS — E785 Hyperlipidemia, unspecified: Secondary | ICD-10-CM | POA: Diagnosis not present

## 2021-06-23 DIAGNOSIS — I10 Essential (primary) hypertension: Secondary | ICD-10-CM | POA: Diagnosis not present

## 2021-06-23 DIAGNOSIS — E7849 Other hyperlipidemia: Secondary | ICD-10-CM

## 2021-06-23 DIAGNOSIS — I2510 Atherosclerotic heart disease of native coronary artery without angina pectoris: Secondary | ICD-10-CM | POA: Diagnosis not present

## 2021-06-23 NOTE — Progress Notes (Signed)
LIPID CLINIC CONSULT NOTE  Chief Complaint:  Follow-up dyslipidemia  Primary Care Physician: Nickola Major, MD  HPI:  Alyssa [name] is a 70 y.o. female who is being seen today for the evaluation of dyslipidemia at the request of Eksir, Earnest Conroy, MD.  This is a very pleasant 70 year old female with a significant history of dyslipidemia.  She first noted she had elevated cholesterol in her 83s.  She has a strong family history of elevated cholesterol and heart disease.  Both of her brothers are age 63 and 38 and have elevated cholesterols over 400.  Her mom also had elevated cholesterol and her father had heart disease and died at age 23.  Unfortunately she has been intolerant to statins and is been on numerous statins in the past including Crestor, Lipitor, lovastatin and ezetimibe.  She also has nonalcoholic fatty liver disease.  Most recently her total cholesterol was 343, triglycerides 154, HDL of 40 and LDL of 272.  This is after losing 40 pounds recently although she is still moderately obese on the ketogenic diet.  She is also been taking Crestor but had significant pain with it and then finally discontinued it.  She has no known coronary disease although did have a pulmonary embolus in 2013.  I personally reviewed her CT scan which does show multivessel coronary artery calcification.  Is a history of breast cancer, diabetes which has resolved with weight loss, and postconcussive syndrome.  She reports that she had had a fairly atherogenic diet however recently again switched ketogenic diet and is cut out a lot of "bad saturated fats".  03/13/2019  Alyssa [name] returns today for follow-up.  Recently had repeat lipids which showed marked improvement in numbers.  Total cholesterol is now 168 with triglycerides 132, HDL 52 and LDL of 90.  He is tolerating Praluent without any side effects.  Overall she is very pleased with her improved cholesterol numbers.  10/17/2019  Alyssa [name] returns  today for follow-up.  Overall she is doing well.  Her repeat lipid recently showed total cholesterol 151, triglycerides 142, HDL 51 and LDL 75.  This is slightly improved and she has subsequently switched from Praluent to [place] due to a change in her primary insurance.  Overall she is pleased with the medication.  12/01/2020  Alyssa [name] continues to have good control of her dyslipidemia.  Total cholesterol 150, HDL 52, triglycerides 133 and LDL 75.  She is tolerating Repatha well.  Unfortunately, she has had issues with elevated blood pressure.  She reports her primary has tried a number of different medications but they have either not gotten her to target or have been associated with side effects.  More recently she was switched off of the diuretic and onto amlodipine.  Initially this was 5 mg but then increased to 7.5 mg daily.  Since then she has had more lower extremity edema.  Blood pressure remains uncontrolled now 148/82 but improved as it was previously between 573 and 220 systolic.  She is asked for my assistance in managing her blood pressure.  06/23/2021  Alyssa [name] returns today for follow-up.  Unfortunately she became intolerant to the chlorthalidone was having a lot of side effects.  He may have been related to some hypokalemia.  She stopped her chlorthalidone and her symptoms improved.  Her blood pressure has been maintained on amlodipine.  She had lab work this summer in August through her PCP which showed a direct LDL 184, total cholesterol  250 and triglycerides 209.  This is much higher than it previously had been in March with a total cholesterol 150 and LDL 75.  She reports compliance with the medication so is difficult to understand why the cholesterol was so much higher.  I think this has to be reassessed   PMHx:  Past Medical History:  Diagnosis Date   Abdominal pain    Anxiety    Arthritis    Asthma    Breast cancer ([place]) 06/2013   left upper inner   Cancer ([place])     breast   Diabetes mellitus without complication ([place])    Takes Tradjenta   Family history of adverse reaction to anesthesia    Patients mother had to be resusciated after having anesthesia   GERD (gastroesophageal reflux disease)    Headache(784.0)    Takes Depakote   Hernia, incisional periumbilical, incarcerated 10/20/5186   Hot flashes    Hx of radiation therapy 09/09/13- 10/13/13   left breast 5000 cGy in 25 sessions, no boost   Hyperlipidemia    Hypertension    PCP DR Nolon Rod   Hypothyroidism    Non-alcoholic fatty liver disease    Peripheral vascular disease (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  Dr. Hassell Done   BREAST BIOPSY     BREAST LUMPECTOMY Left 2015   radiation   BREAST LUMPECTOMY WITH NEEDLE LOCALIZATION AND AXILLARY SENTINEL LYMPH NODE BX Left 08/04/2013   Procedure: BREAST LUMPECTOMY WITH NEEDLE LOCALIZATION AND AXILLARY SENTINEL LYMPH NODE Biopsy x 3;  Surgeon: Rolm Bookbinder, MD;  Location: [facility];  Service: General;  Laterality: Left;   BREAST SURGERY     CARPAL TUNNEL RELEASE  10/2009-12/2010   left - right   CESAREAN SECTION  X 2   COLONOSCOPY      DIAGNOSTIC LAPAROSCOPY     Lap. chole., appendectomy, hernia repair   HERNIA REPAIR  09/15/11   Ventral w/mesh   JOINT REPLACEMENT     KNEE ARTHROPLASTY Right 12/22/2015   Procedure: COMPUTER ASSISTED TOTAL KNEE ARTHROPLASTY;  Surgeon: Marybelle Killings, MD;  Location: [facility];  Service: Orthopedics;  Laterality: Right;   LAPAROSCOPIC CHOLECYSTECTOMY  2006   Dr. Gean Maidens TUNNEL RELEASE Bilateral    THYROIDECTOMY, PARTIAL     VENTRAL HERNIA REPAIR  09/29/2011   Procedure: LAPAROSCOPIC VENTRAL HERNIA;  Surgeon: Adin Hector, MD;  Location: [facility];  Service: General;  Laterality: N/A;  Laparoscopic Ventral Wall Hernia Repair with Mesh    FAMHx:  Family History  Problem Relation Age of Onset   Malignant hyperthermia Mother  Heart attack Father    Stroke Father    Diabetes Other        Grandmother   Other Other        respiratory- Grandmother   Cancer Maternal Grandmother 45       breast   Breast cancer Maternal Grandmother     SOCHx:   reports that she has never smoked. She has never used smokeless tobacco. She reports that she does not drink alcohol and does not use drugs.  ALLERGIES:  Allergies  Allergen Reactions   Aleve [Naproxen Sodium] Hives, Itching and Swelling   Aromasin [Exemestane] Anaphylaxis, Itching and Swelling    Pt currently tolerating medication well (02/03/16)-gwd; Pt stated "I can take this medication for a few months at a time and I take a benadryl when I feel it starting to come on" Patient has facial swelling, itching, and throat swelling  07/12/17  Taking this right now and no problem. sy/rn   Letrozole Shortness Of Breath, Rash and Other (See Comments)    Swelling - feet, eyes,hands;   Speech garbled   Levofloxacin Rash and Swelling    Facial swelling and red facial rash   Penicillins Hives, Swelling and Rash    All over the body. Has patient had a PCN reaction causing immediate rash, facial/tongue/throat swelling, SOB or lightheadedness with  hypotension: Yes Has patient had a PCN reaction causing severe rash involving mucus membranes or skin necrosis: No Has patient had a PCN reaction that required hospitalization No Has patient had a PCN reaction occurring within the last 10 years: No If all of the above answers are "NO", then may proceed with Cephalosporin use.    Symbicort [Budesonide-Formoterol Fumarate] Anaphylaxis, Hives, Swelling and Rash   Codeine Other (See Comments)    hallucinations   Percocet [Oxycodone-Acetaminophen] Other (See Comments)    Causes syncope day after medication has been taken.   Vicodin [Hydrocodone-Acetaminophen] Other (See Comments)    Causes syncope day after medication has been taken.   Doxycycline Swelling    Must with  antihistamine   Ezetimibe Other (See Comments)    Myalgias,fatigue   Prednisone Other (See Comments)    Can take with antihistamine/ can take the shot without antihistamine   Cefdinir Rash and Swelling   Losartan Rash    ROS: Pertinent items noted in HPI and remainder of comprehensive ROS otherwise negative.  HOME MEDS: Current Outpatient Medications on File Prior to Visit  Medication Sig Dispense Refill   albuterol (VENTOLIN HFA) 108 (90 Base) MCG/ACT inhaler Inhale 2 puffs into the lungs every 6 (six) hours as needed for wheezing or shortness of breath.      ALPRAZolam (XANAX) 0.5 MG tablet Take 0.5 mg by mouth at bedtime as needed for sleep.      amLODipine (NORVASC) 5 MG tablet Take 1 tablet by mouth daily.     amphetamine-dextroamphetamine (ADDERALL) 20 MG tablet Take 20 mg by mouth See admin instructions. Take 68m in the morning and 252min the afternoon.     cetirizine (ZYRTEC) 10 MG tablet Take by mouth.     omeprazole (PRILOSEC) 40 MG capsule TAKE 1 CAPSULE BY MOUTH DAILY 30 capsule 0   oxybutynin (DITROPAN-XL) 10 MG 24 hr tablet Take 10 mg by mouth daily.   0   potassium chloride (KLOR-CON) 10 MEQ tablet Take 1 tablet (10 mEq total) by mouth daily. 90 tablet  1   REPATHA SURECLICK 14914G/ML SOAJ INJECT 1 DOSE INTO THE SKIN EVERY  14 DAYS 2 mL 11   Semaglutide (OZEMPIC, 0.25 OR 0.5 MG/DOSE, [place]) Inject into the skin.     thyroid (ARMOUR) 90 MG tablet 1 tablet on an empty stomach     Current Facility-Administered Medications on File Prior to Visit  Medication Dose Route Frequency Provider Last Rate Last Admin   fluticasone (FLOVENT HFA) 44 MCG/ACT inhaler 2 puff  2 puff Inhalation BID Chesley Mires, MD        LABS/IMAGING: No results found for this or any previous visit (from the past 48 hour(s)). No results found.  LIPID PANEL:    Component Value Date/Time   CHOL 150 09/21/2020 0917   TRIG 133 09/21/2020 0917   HDL 52 09/21/2020 0917   CHOLHDL 2.9 09/21/2020 0917   LDLCALC 75 09/21/2020 0917    WEIGHTS: Wt Readings from Last 3 Encounters:  06/23/21 210 lb 6.4 oz (95.4 kg)  04/27/21 215 lb 9.6 oz (97.8 kg)  02/28/21 218 lb (98.9 kg)    VITALS: BP 134/82   Pulse 71   Ht 5' 2"  (1.575 m)   Wt 210 lb 6.4 oz (95.4 kg)   SpO2 97%   BMI 38.48 kg/m   EXAM: General appearance: alert, no distress, and moderately obese Lungs: clear to auscultation bilaterally Heart: regular rate and rhythm, S1, S2 normal, no murmur, click, rub or gallop Extremities: extremities normal, atraumatic, no cyanosis or edema Neurologic: Grossly normal Psych: Pleasant  EKG: Normal sinus rhythm at 71-personally reviewed  ASSESSMENT: Probable HeFH-Dutch Score of 6 Family history of premature coronary disease Statin intolerance Multivessel coronary calcification Uncontrolled hypertension  PLAN: 1.   Mrs. Chesterfield has had good blood pressure control recently.  She had to stop her chlorthalidone due to side effects.  Her lipids were very well controlled with LDL in the 70s however recently her LDL went up significantly to 184 but she denies coming off of therapy or missing any doses of the Repatha.  We will go ahead and reassess that as we will have to get  repeat prior authorization soon for that.  We will follow-up with her regarding those results.  Plan follow-up in 6 months or sooner as necessary.  Pixie Casino, MD, [facility] [place] Director of the Advanced Lipid Disorders &  Cardiovascular Risk Reduction Clinic Diplomate of the American Board of Clinical Lipidology Attending Cardiologist  Direct Dial: [phone]  Fax: [phone]  Website:  [email or web address] 06/23/2021, 1:28 PM

## 2021-06-23 NOTE — Patient Instructions (Signed)
Medication Instructions:  NO CHANGES  *If you need a refill on your cardiac medications before your next appointment, please call your pharmacy*   Lab Work: FASTING lab work to check cholesterol   AND then again in about 6 months, prior to next visit  If you have labs (blood work) drawn today and your tests are completely normal, you will receive your results only by: [place] (if you have MyChart) OR A paper copy in the mail If you have any lab test that is abnormal or we need to change your treatment, we will call you to review the results.   Testing/Procedures: NONE   Follow-Up: At [facility], you and your health needs are our priority.  As part of our continuing mission to provide you with exceptional heart care, we have created designated Provider Care Teams.  These Care Teams include your primary Cardiologist (physician) and Advanced Practice Providers (APPs -  Physician Assistants and Nurse Practitioners) who all work together to provide you with the care you need, when you need it.  We recommend signing up for the patient portal called "MyChart".  Sign up information is provided on this After Visit Summary.  MyChart is used to connect with patients for Virtual Visits (Telemedicine).  Patients are able to view lab/test results, encounter notes, upcoming appointments, etc.  Non-urgent messages can be sent to your provider as well.   To learn more about what you can do with MyChart, go to [email or web address].    Your next appointment:   6 month(s)  The format for your next appointment:   In Person  Provider:   Pixie Casino, MD {

## 2021-06-24 LAB — LIPID PANEL
Chol/HDL Ratio: 2.3 ratio (ref 0.0–4.4)
Cholesterol, Total: 130 mg/dL (ref 100–199)
HDL: 57 mg/dL
LDL Chol Calc (NIH): 49 mg/dL (ref 0–99)
Triglycerides: 143 mg/dL (ref 0–149)
VLDL Cholesterol Cal: 24 mg/dL (ref 5–40)

## 2021-06-27 ENCOUNTER — Other Ambulatory Visit: Payer: Self-pay | Admitting: Adult Health

## 2021-07-01 ENCOUNTER — Telehealth: Payer: Self-pay | Admitting: Internal Medicine

## 2021-07-01 NOTE — Telephone Encounter (Signed)
Attempted PA for repatha in [place] already on file for this request.  Authorization starting on 07/25/2019 and ending on 07/23/2022.

## 2021-08-19 ENCOUNTER — Other Ambulatory Visit: Payer: Self-pay | Admitting: Internal Medicine

## 2021-08-19 DIAGNOSIS — E7849 Other hyperlipidemia: Secondary | ICD-10-CM

## 2021-09-30 ENCOUNTER — Other Ambulatory Visit: Payer: Self-pay | Admitting: Nurse Practitioner

## 2021-09-30 DIAGNOSIS — K7581 Nonalcoholic steatohepatitis (NASH): Secondary | ICD-10-CM

## 2021-10-03 ENCOUNTER — Other Ambulatory Visit: Payer: Self-pay | Admitting: Family Medicine

## 2021-10-03 DIAGNOSIS — Z1231 Encounter for screening mammogram for malignant neoplasm of breast: Secondary | ICD-10-CM

## 2021-10-04 ENCOUNTER — Other Ambulatory Visit: Payer: Self-pay | Admitting: Nurse Practitioner

## 2021-10-04 DIAGNOSIS — R748 Abnormal levels of other serum enzymes: Secondary | ICD-10-CM

## 2021-10-04 DIAGNOSIS — K7581 Nonalcoholic steatohepatitis (NASH): Secondary | ICD-10-CM

## 2021-10-04 DIAGNOSIS — K7401 Hepatic fibrosis, early fibrosis: Secondary | ICD-10-CM

## 2021-10-05 ENCOUNTER — Ambulatory Visit
Admission: RE | Admit: 2021-10-05 | Discharge: 2021-10-05 | Disposition: A | Discharge status: Home / Self Care | Payer: Medicare PPO | Source: Ambulatory Visit | Attending: Nurse Practitioner | Admitting: Nurse Practitioner

## 2021-10-05 DIAGNOSIS — K7401 Hepatic fibrosis, early fibrosis: Secondary | ICD-10-CM

## 2021-10-05 DIAGNOSIS — K7581 Nonalcoholic steatohepatitis (NASH): Secondary | ICD-10-CM

## 2021-10-05 DIAGNOSIS — R748 Abnormal levels of other serum enzymes: Secondary | ICD-10-CM

## 2021-10-10 ENCOUNTER — Other Ambulatory Visit: Payer: Self-pay | Admitting: Nurse Practitioner

## 2021-10-10 DIAGNOSIS — D376 Neoplasm of uncertain behavior of liver, gallbladder and bile ducts: Secondary | ICD-10-CM

## 2021-10-13 ENCOUNTER — Telehealth: Payer: Self-pay

## 2021-10-13 NOTE — Telephone Encounter (Signed)
Return call to pt, pt states she went to schedule MM and they informed her she had no provider listed.  Per Dr Jana Hakim last note, pt was released from our care to PCP - pt verbalized understanding of this.  I informed pt that if she wanted to return and be seen for her exam and MM we could establish her care with survivorship.  Pt states she would call us back if she wanted to proceed with that as she is currently being worked up for a liver mass and she needs to take care of that first.   ?

## 2021-10-25 ENCOUNTER — Ambulatory Visit
Admission: RE | Admit: 2021-10-25 | Discharge: 2021-10-25 | Disposition: A | Discharge status: Home / Self Care | Payer: Medicare PPO | Source: Ambulatory Visit | Attending: Nurse Practitioner | Admitting: Nurse Practitioner

## 2021-10-25 DIAGNOSIS — D376 Neoplasm of uncertain behavior of liver, gallbladder and bile ducts: Secondary | ICD-10-CM

## 2021-10-25 MED ORDER — GADOBENATE DIMEGLUMINE 529 MG/ML IV SOLN
20.0000 mL | Freq: Once | INTRAVENOUS | Status: AC | PRN
  Administered 2021-10-25: 20 mL via INTRAVENOUS

## 2021-10-26 ENCOUNTER — Ambulatory Visit: Payer: Medicare PPO | Admitting: Adult Health

## 2021-11-08 NOTE — Progress Notes (Signed)
? ?[facility] ? ?PATIENT: Alyssa [name] ?DOB: [birth year] ? ? ?REASON FOR VISIT: Follow-up postconcussion syndrome ?HISTORY FROM: Patient ? ? ?Chief complaint:  ?Chief Complaint  ?Patient presents with  ? Follow-up  ?  RM 2 alone ?Pt is well, states her headaches are not as sever as before. No new concerns   ?  ? ? ?HISTORY OF PRESENT ILLNESS:  ? ?Update 11/08/2021 JM: patient returns for 6 month follow up. Overall stable since prior visit with Dr. Leonie Man. She has not had any additional severe headaches since prior visit. She will get occasional mild tension headaches approx 2x per week. Occasionally will need to stop what she is doing, will use Excedrin migraine with benefit.  She continues to remain off Depakote which she was previously on for headaches.  Has noticed mood fluctuation since discontinuing, she is scheduled to see a psychiatrist next week to further discuss. Balance has been stable since prior visit, no recent falls.  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  No new concerns at this time. ? ? ? ?Hx provided for reference purposes only  ?Update 04/27/2021 Dr. Leonie Man : She returns for follow-up after last visit 2 months ago with [place] practitioner.  Patient did not tolerate amitriptyline as she felt disoriented and had brain fog and discontinued it.  She has also stopped Depakote more than a month ago with headaches in fact seem to be improving.  Headaches are not as frequent and occur rarely once a week or so she is scared to take any medicines.  Headaches are moderate in severity and generalized aching in nature with some posterior neck tightness.  Headache is not accompanied by nausea, vomiting, light or sound sensitivity or any visual symptoms.  Patient feels her balance is also better she has had no falls or injuries.  She had MRI scan of the brain done on 03/11/2021 which I personally reviewed shows mild changes of chronic small vessel disease and a partially  empty sella.  No acute abnormalities noted.  She had cholesterol profile on 02/22/2021 which showed elevated total cholesterol of 250, LDL of 184 and low HDL of 49.  Hemoglobin A1c was 6.1.  Her cardiologist Dr. Debara Pickett has started her on Repatha injections which she is tolerating well without any side effects.  She has no new complaints today. ? ?Update 02/28/2021 JM: Ms. Gauer returns for routine 37-monthfollow-up.  Since prior visit, she reports having daily headaches as well as fatigue, imbalance and difficulty with word recall over the past 2 weeks - questions possible medication side effect to new BP meds started approx 1 month ago (added chlorthalidone and lowered amlodipine dose to 541mdue to edema). Plans to discuss further with Dr. HiDebara Pickett Does report improvement in her blood pressure - today 144/88 previously SBPs 180-200s.  Reports some issues with speech initially after head injury which improved.  Cognition has been stable and continues to use compensation strategies although she is concerned regarding possible progression.  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 Reports 2 falls thankfully without injury.  Generalized headache present daily which lasts all day. Can increase in severity at times and will take Tylenol with only limited benefit.  Denies photophobia or phonophobia.  Continues on Depakote but concerned regarding increase liver enzymes (AST 84, ALT 124).  No further concerns  at this time. ? ?Update 08/25/2020 JM: Ms. Creason returns for 59-monthfollow-up with history of postconcussion syndrome.  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 She has remained on depakote ER 5068mnightly but reports since November she has been experiencing occasional headaches upon awakening. She will take tylenol with occasional benefit.  She also  reports insomnia.  Reports previously undergoing sleep study approximately 10 years ago and was negative for sleep apnea.  She denies snoring or any witnessed apnea.  She has been having difficulty with blood pressure control and PCP recently switched hydrochlorothiazide to losartan.  Blood pressure today 168/92 -occasionally monitors at home.  PCP is also working her up for elevated liver enzymes and has scheduled ultrasound 2/7.  No further concerns at this time. ? ?Update 02/23/2020 JM: Ms. WiCockereturns for postconcussion syndrome with memory loss and headaches.  Continues on Depakote ER 500 mg nightly with benefit of headache management without side effects.  Memory loss has been stable without worsening.  Since prior visit, underwent uncomplicated cervical spinal fusion on 01/16/2020.  Also started on Adderall by PCP for possible ADHD and mood swings with great improvement.  No concerns at this time. ? ?Update 08/26/2019 JM: Ms. WiParracks a [age]ear old female who is being seen today for 1 year follow-up regarding postconcussion syndrome with memory loss and headaches.  At prior visit, she was doing well in regards to stable headaches and memory.  She endorses worsening headaches since the beginning of December which are generalized associated with photophobia and phonophobia with similar characteristics compared to her prior headaches.  She has been using Tylenol 1000 mg twice daily with only mild benefit.  She does endorse increased stress as she is a high school special ed teacher and had difficulty with the virtual learning as she felt "isolated".  She has now since returned to in class learning and has been feeling better since that time but she continues to experience short-term memory loss and is concerned regarding overall worsening memory. MMSE today 29/30.  She continues to use Depakote DR 500 mg nightly but previously on ER formulation.  She denies depression or anxiety and denies any history or family  history.  She does endorse increased stress.  Recently started on Repatha by cardiology with excellent improvement of cholesterol levels.  Blood pressure today satisfactory at 132/84.  No further concerns at this time. ? ?UPDATE 1/2/2020CM Ms. [id number] old female returns for follow-up with history of memory loss postconcussion syndrome and headaches.  She is currently on Depakote 500 mg at night with good control of her headaches however she does wake up with some morning headaches occasionally.  She occasionally takes Tylenol with relief.  She denies any daytime drowsiness or snoring.  She claims she feels the best she has in a long time she is exercising.  She continues to work she teaches in [employer].  She denies any issues with her job performance.  Her mother recently died.  She returns for reevaluation ? ?Update 12/20/2018CM Ms. [id number] old female returns for follow-up with history of headaches and postconcussion syndrome and memory loss.  For her headaches she is taking Depakote but decided to taper herself off of the medication back in the summer, her headaches returned and she resumed the Depakote.  Her headaches are now in good control her memory loss is stable per MMSE.  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  She  has not had no issues performing her job.  She returns for reevaluation and refills ? ?UPDATE 07/19/16 CMMs. Dahan, 71 year old female returns for yearly followup.  Patient fell off of the chair at school back in June2015 and hit her head. CT of the head at that time without abnormalities her headaches returned and she started taking her Depakote again with good benefit. Her memory is stable per MMSE. 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  She has not had any issues performing  her job.  She had breast cancer surgery January 2015.  She had knee replacement on the right 12/22/2015 She returns for reevaluation and refills ? ?HISTORY: of cognitive changes post  head injury. She was initially evaluated by Dr. Leonie Man in September of 2009. She

## 2021-11-09 ENCOUNTER — Encounter: Payer: Self-pay | Admitting: Adult Health

## 2021-11-09 ENCOUNTER — Ambulatory Visit: Payer: Medicare PPO | Admitting: Adult Health

## 2021-11-09 VITALS — BP 143/77 | HR 65 | Ht 62.0 in | Wt 205.0 lb

## 2021-11-09 DIAGNOSIS — F0781 Postconcussional syndrome: Secondary | ICD-10-CM | POA: Diagnosis not present

## 2021-11-09 DIAGNOSIS — R519 Headache, unspecified: Secondary | ICD-10-CM

## 2021-11-09 NOTE — Patient Instructions (Signed)
No changes today ? ?Continue use of Excedrine migraine as needed ? ? ?Please call if your headaches/migraines start to worsen to further discuss other treatment options  ? ? ? ? ? ? ? ?Thank you for coming to see [place] at [facility]. I hope we have been able to provide you high quality care today. ? ?You may receive a patient satisfaction survey over the next few weeks. We would appreciate your feedback and comments so that we may continue to improve ourselves and the health of our patients. ? ?

## 2021-11-17 ENCOUNTER — Ambulatory Visit
Admission: RE | Admit: 2021-11-17 | Discharge: 2021-11-17 | Disposition: A | Discharge status: Home / Self Care | Payer: Medicare PPO | Source: Ambulatory Visit | Attending: Family Medicine | Admitting: Family Medicine

## 2021-11-17 DIAGNOSIS — Z1231 Encounter for screening mammogram for malignant neoplasm of breast: Secondary | ICD-10-CM

## 2021-12-22 LAB — LIPID PANEL
Chol/HDL Ratio: 2.1 ratio (ref 0.0–4.4)
Cholesterol, Total: 114 mg/dL (ref 100–199)
HDL: 54 mg/dL (ref >39)
LDL Chol Calc (NIH): 41 mg/dL (ref 0–99)
Triglycerides: 104 mg/dL (ref 0–149)
VLDL Cholesterol Cal: 19 mg/dL (ref 5–40)

## 2022-01-02 ENCOUNTER — Ambulatory Visit (INDEPENDENT_AMBULATORY_CARE_PROVIDER_SITE_OTHER): Payer: Medicare PPO

## 2022-01-02 ENCOUNTER — Encounter: Payer: Self-pay | Admitting: Physician Assistant

## 2022-01-02 ENCOUNTER — Ambulatory Visit: Payer: Medicare PPO | Admitting: Physician Assistant

## 2022-01-02 DIAGNOSIS — M25512 Pain in left shoulder: Secondary | ICD-10-CM

## 2022-01-02 DIAGNOSIS — G8929 Other chronic pain: Secondary | ICD-10-CM | POA: Diagnosis not present

## 2022-01-02 MED ORDER — METHYLPREDNISOLONE ACETATE 40 MG/ML IJ SUSP
80.0000 mg | INTRAMUSCULAR | Status: AC | PRN
  Administered 2022-01-02: 80 mg via INTRA_ARTICULAR

## 2022-01-02 MED ORDER — LIDOCAINE HCL 1 % IJ SOLN
2.0000 mL | INTRAMUSCULAR | Status: AC | PRN
  Administered 2022-01-02: 2 mL

## 2022-01-02 NOTE — Progress Notes (Signed)
Office Visit Note   Patient: Alyssa [name]           Date of Birth: 1951-01-05           MRN: [id number] Visit Date: 01/02/2022              Requested by: Nickola Major, MD [address] [place] [address] [place],  [place] [id number] PCP: Nickola Major, MD  Chief Complaint  Patient presents with   Left Shoulder - Follow-up      HPI: Patient is a pleasant active 71 year old woman with a chief complaint of left shoulder pain x2 weeks.  She denies any injuries but was traveling.  She is status post cervical neck surgery with Dr. Lorin Mercy.  She does not feel this feels like neck related.  Unfortunately she has multiple drug allergies and cannot take Tylenol.  She has been taking ibuprofen but wants to limit how much she takes.  Assessment & Plan: Visit Diagnoses:  1. Acute pain of left shoulder     Plan: She has positive and impingement findings consistent with some rotator cuff tendinitis.  She has had steroid shots in the past in her knees and has tolerated them well.  We will go forward with the injection into her left shoulder today we will also refer her to physical therapy for rotator cuff strengthening we will follow-up in a month.  Of note she has difficulty with oral steroids but has tolerated steroid injections in the past without any difficulty  Follow-Up Instructions:   Ortho Exam  Patient is alert, oriented, no adenopathy, well-dressed, normal affect, normal respiratory effort. Left shoulder no redness no erythema.  She has forward elevation without difficulty.  Internal rotation behind her back is painful.  She has strength that is 5 out of 5.  She has a positive empty can test as well as a positive speeds test.  Pain is focally around the shoulder more posteriorly than anteriorly distal sensation is intact  Imaging: XR Shoulder Left  Result Date: 01/02/2022 Radiographs of her left shoulder demonstrate humeral head in good congruent position in the glenoid fossa.  No acute  fractures are noted very early [facility] arthritis no significant inferior bony osteophytes  No images are attached to the encounter.  Labs: Lab Results  Component Value Date   HGBA1C 6.0 (H) 01/14/2020   HGBA1C 6.8 (H) 12/10/2015   REPTSTATUS 10/15/2011 FINAL 10/12/2011   CULT  10/12/2011    NO SALMONELLA, SHIGELLA, CAMPYLOBACTER, OR YERSINIA ISOLATED     Lab Results  Component Value Date   ALBUMIN 4.0 01/14/2020   ALBUMIN 4.6 12/31/2018   ALBUMIN 4.5 10/08/2018    No results found for: "MG" No results found for: "VD25OH"  No results found for: "PREALBUMIN"    Latest Ref Rng & Units 01/14/2020    8:40 AM 10/08/2018   12:45 PM 11/05/2017   10:47 AM  CBC EXTENDED  WBC 4.0 - 10.5 K/uL 5.1  5.3  6.0   RBC 3.87 - 5.11 MIL/uL 5.15  5.37  5.31   Hemoglobin 12.0 - 15.0 g/dL 14.4  14.8  15.0   HCT 36.0 - 46.0 % 45.8  47.0  46.1   Platelets 150 - 400 K/uL 280  280  246   NEUT# 1.7 - 7.7 K/uL  2.4  2.8   Lymph# 0.7 - 4.0 K/uL  2.3  2.5      There is no height or weight on file to calculate BMI.  Orders:  Orders Placed This Encounter  Procedures   XR Shoulder Left   Ambulatory referral to Physical Therapy   No orders of the defined types were placed in this encounter.    Procedures: Large Joint Inj: L subacromial bursa on 01/02/2022 3:07 PM Indications: diagnostic evaluation and pain Details: 25 G 1.5 in needle, posterior approach  Arthrogram: No  Medications: 2 mL lidocaine 1 %; 80 mg methylPREDNISolone acetate 40 MG/ML Outcome: tolerated well, no immediate complications Procedure, treatment alternatives, risks and benefits explained, specific risks discussed. Consent was given by the patient.    Clinical Data: No additional findings.  ROS:  All other systems negative, except as noted in the HPI. Review of Systems  Objective: Vital Signs: There were no vitals taken for this visit.  Specialty Comments:  No specialty comments available.  PMFS History: Patient  Active Problem List   Diagnosis Date Noted   Spondylosis of cervical region without myelopathy or radiculopathy 01/16/2020   Other spondylosis with radiculopathy, cervical region 06/13/2018   GERD (gastroesophageal reflux disease) 04/05/2018   Allergic rhinitis 04/05/2018   Upper airway cough syndrome 04/05/2018   Patellar tendinitis, right knee 03/01/2017   Status post total right knee replacement 12/22/2015   Hepatic steatosis 02/09/2015   Hot flashes 09/09/2014   Osteopenia 09/09/2014   Malignant neoplasm of upper-inner quadrant of left breast in female, estrogen receptor positive ([place]) 07/23/2013   Memory loss 07/14/2013   Headache 07/14/2013   Other malaise and fatigue 07/14/2013   Acute pulmonary embolism ([place]) 10/07/2011   Leukocytosis 10/07/2011   Thyroid mass, left inferior lobe, 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 Mar2013 10/07/2011   Hyponatremia 10/07/2011   Obesity (BMI 30-39.9) 08/07/2011   Post concussion syndrome    Past Medical History:  Diagnosis Date   Abdominal pain    Anxiety    Arthritis    Asthma    Breast cancer ([place]) 06/2013   left upper inner   Cancer ([place])    breast   Diabetes mellitus without complication ([place])    Takes Tradjenta   Family history of adverse reaction to anesthesia    Patients mother had to be resusciated after having anesthesia   GERD (gastroesophageal reflux disease)    Headache(784.0)    Takes Depakote   Hernia, incisional periumbilical, incarcerated 5/91/6384   Hot flashes    Hx of radiation therapy 09/09/13- 10/13/13   left breast 5000 cGy in 25 sessions, no boost   Hyperlipidemia    Hypertension    PCP DR Nolon Rod   Hypothyroidism    Non-alcoholic fatty liver disease    Peripheral vascular disease ([place])    pulmonary embolis-still have some   Post concussion syndrome    4 yrs ago- sees [facility]- Dr Leonie Man every 6 months-   has sleep study 2011 following accident but was neg per patient-  short term memory issues, dates and times   Pulmonary  embolism ([place]) 2013   Recurrent upper respiratory infection (URI)    FLU 08/23/11-08/29/11 then URI 08/29/11- 09/08/11- S/P zpack and steroids-  improved      no cough or congestion   Thyroid disease    Thyroid mass, left inferior lobe, 3.6cm [id number] 10/07/2011   Ventral hernia     Family History  Problem Relation Age of Onset   Malignant hyperthermia Mother    Heart attack Father    Stroke Father    Diabetes Other        Grandmother   Other Other  respiratory- Grandmother   Cancer Maternal Grandmother 81       breast   Breast cancer Maternal Grandmother     Past Surgical History:  Procedure Laterality Date   ANTERIOR CERVICAL DECOMP/DISCECTOMY FUSION N/A 01/16/2020   Procedure: c5-6, c6-7 ANTERIOR CERVICAL DECOMPRESSION/DISCECTOMY FUSION, ALLOGRAFT AND PLATE;  Surgeon: Marybelle Killings, MD;  Location: [facility];  Service: Orthopedics;  Laterality: N/A;   APPENDECTOMY  2002   lap appy.  Dr. Hassell Done   BREAST BIOPSY     BREAST LUMPECTOMY Left 2015   radiation   BREAST LUMPECTOMY WITH NEEDLE LOCALIZATION AND AXILLARY SENTINEL LYMPH NODE BX Left 08/04/2013   Procedure: BREAST LUMPECTOMY WITH NEEDLE LOCALIZATION AND AXILLARY SENTINEL LYMPH NODE Biopsy x 3;  Surgeon: Rolm Bookbinder, MD;  Location: [facility];  Service: General;  Laterality: Left;   BREAST SURGERY     CARPAL TUNNEL RELEASE  10/2009-12/2010   left - right   CESAREAN SECTION  X 2   COLONOSCOPY     DIAGNOSTIC LAPAROSCOPY     Lap. chole., appendectomy, hernia repair   HERNIA REPAIR  09/15/11   Ventral w/mesh   JOINT REPLACEMENT     KNEE ARTHROPLASTY Right 12/22/2015   Procedure: COMPUTER ASSISTED TOTAL KNEE ARTHROPLASTY;  Surgeon: Marybelle Killings, MD;  Location: [facility];  Service: Orthopedics;  Laterality: Right;   LAPAROSCOPIC CHOLECYSTECTOMY  2006   Dr. Gean Maidens TUNNEL RELEASE Bilateral    THYROIDECTOMY, PARTIAL     VENTRAL HERNIA REPAIR  09/29/2011   Procedure: LAPAROSCOPIC VENTRAL HERNIA;  Surgeon: Adin Hector, MD;   Location: [facility];  Service: General;  Laterality: N/A;  Laparoscopic Ventral Wall Hernia Repair with Mesh   Social History   Occupational History    Employer: [employer]  Tobacco Use   Smoking status: Never   Smokeless tobacco: Never  Vaping Use   Vaping Use: Never used  Substance and Sexual Activity   Alcohol use: No    Alcohol/week: 1.0 standard drink of alcohol    Types: 1 Glasses of wine per week   Drug use: No   Sexual activity: Yes    Comment: Menarche age 55, first live birth age 59,  [place] P2. She stopped having periods approximately 1999 and took HRT until 2005.

## 2022-01-03 ENCOUNTER — Ambulatory Visit: Payer: Medicare PPO | Admitting: Orthopaedic Surgery

## 2022-01-04 ENCOUNTER — Ambulatory Visit: Payer: Medicare PPO | Admitting: Internal Medicine

## 2022-01-04 ENCOUNTER — Encounter: Payer: Self-pay | Admitting: Internal Medicine

## 2022-01-04 VITALS — BP 160/80 | HR 57 | Ht 62.0 in | Wt 197.0 lb

## 2022-01-04 DIAGNOSIS — I480 Paroxysmal atrial fibrillation: Secondary | ICD-10-CM

## 2022-01-04 DIAGNOSIS — I10 Essential (primary) hypertension: Secondary | ICD-10-CM | POA: Diagnosis not present

## 2022-01-04 DIAGNOSIS — I2584 Coronary atherosclerosis due to calcified coronary lesion: Secondary | ICD-10-CM

## 2022-01-04 DIAGNOSIS — E7849 Other hyperlipidemia: Secondary | ICD-10-CM

## 2022-01-04 DIAGNOSIS — I2510 Atherosclerotic heart disease of native coronary artery without angina pectoris: Secondary | ICD-10-CM | POA: Diagnosis not present

## 2022-01-04 MED ORDER — METOPROLOL SUCCINATE ER 25 MG PO TB24: 12.5000 mg | ORAL_TABLET | Freq: Every day | ORAL | 3 refills | Status: DC

## 2022-01-04 MED ORDER — DILTIAZEM HCL ER COATED BEADS 120 MG PO CP24: 120.0000 mg | ORAL_CAPSULE | Freq: Every day | ORAL | 3 refills | Status: DC

## 2022-01-04 MED ORDER — APIXABAN 5 MG PO TABS: 5.0000 mg | ORAL_TABLET | Freq: Two times a day (BID) | ORAL | 11 refills | Status: DC

## 2022-01-04 NOTE — Progress Notes (Signed)
LIPID CLINIC CONSULT NOTE  Chief Complaint:  Follow-up dyslipidemia  Primary Care Physician: Nickola Major, MD  HPI:  Alyssa [name] is a 71 y.o. female who is being seen today for the evaluation of dyslipidemia at the request of Eksir, Earnest Conroy, MD.  This is a very pleasant 71 year old female with a significant history of dyslipidemia.  She first noted she had elevated cholesterol in her 63s.  She has a strong family history of elevated cholesterol and heart disease.  Both of her brothers are age 47 and 53 and have elevated cholesterols over 400.  Her mom also had elevated cholesterol and her father had heart disease and died at age 76.  Unfortunately she has been intolerant to statins and is been on numerous statins in the past including Crestor, Lipitor, lovastatin and ezetimibe.  She also has nonalcoholic fatty liver disease.  Most recently her total cholesterol was 343, triglycerides 154, HDL of 40 and LDL of 272.  This is after losing 40 pounds recently although she is still moderately obese on the ketogenic diet.  She is also been taking Crestor but had significant pain with it and then finally discontinued it.  She has no known coronary disease although did have a pulmonary embolus in 2013.  I personally reviewed her CT scan which does show multivessel coronary artery calcification.  Is a history of breast cancer, diabetes which has resolved with weight loss, and postconcussive syndrome.  She reports that she had had a fairly atherogenic diet however recently again switched ketogenic diet and is cut out a lot of "bad saturated fats".  03/13/2019  Alyssa [name] returns today for follow-up.  Recently had repeat lipids which showed marked improvement in numbers.  Total cholesterol is now 168 with triglycerides 132, HDL 52 and LDL of 90.  He is tolerating Praluent without any side effects.  Overall she is very pleased with her improved cholesterol numbers.  10/17/2019  Alyssa [name] returns  today for follow-up.  Overall she is doing well.  Her repeat lipid recently showed total cholesterol 151, triglycerides 142, HDL 51 and LDL 75.  This is slightly improved and she has subsequently switched from Praluent to Bailey due to a change in her primary insurance.  Overall she is pleased with the medication.  12/01/2020  Alyssa [name] continues to have good control of her dyslipidemia.  Total cholesterol 150, HDL 52, triglycerides 133 and LDL 75.  She is tolerating Repatha well.  Unfortunately, she has had issues with elevated blood pressure.  She reports her primary has tried a number of different medications but they have either not gotten her to target or have been associated with side effects.  More recently she was switched off of the diuretic and onto amlodipine.  Initially this was 5 mg but then increased to 7.5 mg daily.  Since then she has had more lower extremity edema.  Blood pressure remains uncontrolled now 148/82 but improved as it was previously between 026 and 378 systolic.  She is asked for my assistance in managing her blood pressure.  06/23/2021  Alyssa [name] returns today for follow-up.  Unfortunately she became intolerant to the chlorthalidone was having a lot of side effects.  He may have been related to some hypokalemia.  She stopped her chlorthalidone and her symptoms improved.  Her blood pressure has been maintained on amlodipine.  She had lab work this summer in August through her PCP which showed a direct LDL 184, total cholesterol  250 and triglycerides 209.  This is much higher than it previously had been in March with a total cholesterol 150 and LDL 75.  She reports compliance with the medication so is difficult to understand why the cholesterol was so much higher.  I think this has to be reassessed  01/04/2022  Alyssa [name] is seen today in follow-up.  Fortunately her lipids have responded again to [place].  Total cholesterol now 114, glycerides 104, HDL 54 and LDL 41.  She  reports that her blood pressure still is uncontrolled.  She had been started on amlodipine 5 mg daily.  Blood pressures at home range between 025-852 systolic over 80.  She needs additional blood pressure lowering.  She has had some medicine intolerances which make this difficult including rash with an ARB.  Heart rate was noted to be low today however shows a sinus bradycardia at 57.  She says that she gets occasional palpitations recently and noted her heart rate has been jumping up for no reason.  She does wear an Apple Watch and has done EKGs of this.  She did not pay much attention to it but did note that the EKG interpretations on the Apple Watch suggested atrial fibrillation.  I reviewed these today and indeed she is having multiple episodes of atrial fibrillation.  This is a new diagnosis.  Her CHA2DS2-VASc score is at least 3 for age, female sex and hypertension.   PMHx:  Past Medical History:  Diagnosis Date   Abdominal pain    Anxiety    Arthritis    Asthma    Breast cancer ([place]) 06/2013   left upper inner   Cancer ([place])    breast   Diabetes mellitus without complication ([place])    Takes Tradjenta   Family history of adverse reaction to anesthesia    Patients mother had to be resusciated after having anesthesia   GERD (gastroesophageal reflux disease)    Headache(784.0)    Takes Depakote   Hernia, incisional periumbilical, incarcerated 7/78/2423   Hot flashes    Hx of radiation therapy 09/09/13- 10/13/13   left breast 5000 cGy in 25 sessions, no boost   Hyperlipidemia    Hypertension    PCP DR Nolon Rod   Hypothyroidism    Non-alcoholic fatty liver disease    Peripheral vascular disease ([place])    pulmonary embolis-still have some   Post concussion syndrome    4 yrs ago- sees [facility]- Dr Leonie Man every 6 months-   has sleep study 2011 following accident but was neg per patient-  short term memory issues, dates and times   Pulmonary embolism ([place]) 2013   Recurrent upper  respiratory infection (URI)    FLU 08/23/11-08/29/11 then URI 08/29/11- 09/08/11- S/P zpack and steroids-  improved      no cough or congestion   Thyroid disease    Thyroid mass, left inferior lobe, 3.6cm [id number] 10/07/2011   Ventral hernia     Past Surgical History:  Procedure Laterality Date   ANTERIOR CERVICAL DECOMP/DISCECTOMY FUSION N/A 01/16/2020   Procedure: c5-6, c6-7 ANTERIOR CERVICAL DECOMPRESSION/DISCECTOMY FUSION, ALLOGRAFT AND PLATE;  Surgeon: Marybelle Killings, MD;  Location: [place];  Service: Orthopedics;  Laterality: N/A;   APPENDECTOMY  2002   lap appy.  Dr. Hassell Done   BREAST BIOPSY     BREAST LUMPECTOMY Left 2015   radiation   BREAST LUMPECTOMY WITH NEEDLE LOCALIZATION AND AXILLARY SENTINEL LYMPH NODE BX Left 08/04/2013   Procedure: BREAST LUMPECTOMY WITH NEEDLE  LOCALIZATION AND AXILLARY SENTINEL LYMPH NODE Biopsy x 3;  Surgeon: Rolm Bookbinder, MD;  Location: [place];  Service: General;  Laterality: Left;   BREAST SURGERY     CARPAL TUNNEL RELEASE  10/2009-12/2010   left - right   CESAREAN SECTION  X 2   COLONOSCOPY     DIAGNOSTIC LAPAROSCOPY     Lap. chole., appendectomy, hernia repair   HERNIA REPAIR  09/15/11   Ventral w/mesh   JOINT REPLACEMENT     KNEE ARTHROPLASTY Right 12/22/2015   Procedure: COMPUTER ASSISTED TOTAL KNEE ARTHROPLASTY;  Surgeon: Marybelle Killings, MD;  Location: [place];  Service: Orthopedics;  Laterality: Right;   LAPAROSCOPIC CHOLECYSTECTOMY  2006   Dr. Gean Maidens TUNNEL RELEASE Bilateral    THYROIDECTOMY, PARTIAL     VENTRAL HERNIA REPAIR  09/29/2011   Procedure: LAPAROSCOPIC VENTRAL HERNIA;  Surgeon: Adin Hector, MD;  [facility];  Service: General;  Laterality: N/A;  Laparoscopic Ventral Wall Hernia Repair with Mesh    FAMHx:  Family History  Problem Relation Age of Onset   Malignant hyperthermia Mother    Heart attack Father    Stroke Father    Diabetes Other        Grandmother   Other Other        respiratory- Grandmother   Cancer  Maternal Grandmother 100       breast   Breast cancer Maternal Grandmother     SOCHx:   reports that she has never smoked. She has never used smokeless tobacco. She reports that she does not drink alcohol and does not use drugs.  ALLERGIES:  Allergies  Allergen Reactions   Aleve [Naproxen Sodium] Hives, Itching and Swelling   Aromasin [Exemestane] Anaphylaxis, Itching and Swelling    Pt currently tolerating medication well (02/03/16)-gwd; Pt stated "I can take this medication for a few months at a time and I take a benadryl when I feel it starting to come on" Patient has facial swelling, itching, and throat swelling  07/12/17  Taking this right now and no problem. sy/rn   Letrozole Shortness Of Breath, Rash and Other (See Comments)    Swelling - feet, eyes,hands;   Speech garbled   Levofloxacin Rash and Swelling    Facial swelling and red facial rash   Penicillins Hives, Swelling and Rash    All over the body. Has patient had a PCN reaction causing immediate rash, facial/tongue/throat swelling, SOB or lightheadedness with hypotension: Yes Has patient had a PCN reaction causing severe rash involving mucus membranes or skin necrosis: No Has patient had a PCN reaction that required hospitalization No Has patient had a PCN reaction occurring within the last 10 years: No If all of the above answers are "NO", then may proceed with Cephalosporin use.    Symbicort [Budesonide-Formoterol Fumarate] Anaphylaxis, Hives, Swelling and Rash   Codeine Other (See Comments)    hallucinations   Percocet [Oxycodone-Acetaminophen] Other (See Comments)    Causes syncope day after medication has been taken.   Vicodin [Hydrocodone-Acetaminophen] Other (See Comments)    Causes syncope day after medication has been taken.   Doxycycline Swelling    Must with  antihistamine   Ezetimibe Other (See Comments)    Myalgias,fatigue   Prednisone Other (See Comments)    Can take with antihistamine/ can take the  shot without antihistamine   Cefdinir Rash and Swelling   Losartan Rash    ROS: Pertinent items noted in HPI and remainder of  comprehensive ROS otherwise negative.  HOME MEDS: Current Outpatient Medications on File Prior to Visit  Medication Sig Dispense Refill   albuterol (VENTOLIN HFA) 108 (90 Base) MCG/ACT inhaler Inhale 2 puffs into the lungs every 6 (six) hours as needed for wheezing or shortness of breath.      amphetamine-dextroamphetamine (ADDERALL) 20 MG tablet Take 20 mg by mouth See admin instructions. Take 80m in the morning and 221min the afternoon.     cetirizine (ZYRTEC) 10 MG tablet Take by mouth.     levothyroxine (SYNTHROID) 137 MCG tablet Take 137 mcg by mouth every morning.     levothyroxine (SYNTHROID) 137 MCG tablet 1 tablet in the morning on an empty stomach     omeprazole (PRILOSEC) 40 MG capsule TAKE 1 CAPSULE BY MOUTH DAILY 30 capsule 0   oxybutynin (DITROPAN-XL) 10 MG 24 hr tablet Take 10 mg by mouth daily.   0   potassium chloride (KLOR-CON) 10 MEQ tablet TAKE 1 TABLET(10 MEQ) BY MOUTH DAILY 90 tablet 3   REPATHA SURECLICK 14161G/ML SOAJ INJECT 1 DOSE INTO THE SKIN EVERY 14 DAYS 2 mL 11   Semaglutide (OZEMPIC, 0.25 OR 0.5 MG/DOSE, [place]) Inject 1 mg into the skin once a week.     Current Facility-Administered Medications on File Prior to Visit  Medication Dose Route Frequency Provider Last Rate Last Admin   fluticasone (FLOVENT HFA) 44 MCG/ACT inhaler 2 puff  2 puff Inhalation BID SoChesley MiresMD        LABS/IMAGING: No results found for this or any previous visit (from the past 48 hour(s)). XR Shoulder Left  Result Date: 01/02/2022 Radiographs of her left shoulder demonstrate humeral head in good congruent position in the glenoid fossa.  No acute fractures are noted very early [facility] LLCrthritis no significant inferior bony osteophytes   LIPID PANEL:    Component Value Date/Time   CHOL 114 12/22/2021 1015   TRIG 104 12/22/2021 1015   HDL 54 12/22/2021 1015    CHOLHDL 2.1 12/22/2021 1015   LDLCALC 41 12/22/2021 1015    WEIGHTS: Wt Readings from Last 3 Encounters:  01/04/22 197 lb (89.4 kg)  11/09/21 205 lb (93 kg)  06/23/21 210 lb 6.4 oz (95.4 kg)    VITALS: BP (!) 160/80   Pulse (!) 57   Ht 5' 2"  (1.575 m)   Wt 197 lb (89.4 kg)   SpO2 98%   BMI 36.03 kg/m   EXAM: General appearance: alert, no distress, and moderately obese Lungs: clear to auscultation bilaterally Heart: regular rate and rhythm, S1, S2 normal, no murmur, click, rub or gallop Extremities: extremities normal, atraumatic, no cyanosis or edema Neurologic: Grossly normal Psych: Pleasant  EKG: Sinus bradycardia at 57-personally reviewed  ASSESSMENT: New onset atrial fibrillation-CHA2DS2-VASc score of 3 Probable HeFH-Dutch Score of 6 Family history of premature coronary disease Statin intolerance Multivessel coronary calcification Uncontrolled hypertension  PLAN: 1.   Mrs. WiHanssenas been having intermittent palpitations at rest with elevated heart rate and her Apple Watch is indicated atrial fibrillation numerous times.  EKG today shows a sinus bradycardia in the upper 50s.  I do think she would tolerate low-dose beta-blocker to help with her uncontrolled rates while she is in A-fib and we will start Toprol-XL 12.5 mg daily.  She will continue on amlodipine and may need additional medicine for her hypertension.  She says she has had testing for sleep apnea previously and this was negative and she does not snore.  She will  need to be anticoagulated for CHA2DS2-VASc score of 3 and I have advised starting Eliquis 5 mg twice daily.  I will plan to follow-up in about 6 months or sooner as necessary.  Continue her current lipid therapy as she is doing very well.  Pixie Casino, MD, [facility], [place] Director of the Advanced Lipid Disorders &  Cardiovascular Risk Reduction Clinic Diplomate of the American Board of Clinical  Lipidology Attending Cardiologist  Direct Dial: [phone]  Fax: [phone]  Website:  [email or web address] 01/04/2022, 9:31 AM

## 2022-01-04 NOTE — Patient Instructions (Addendum)
Medication Instructions:  START eliquis 61m twice daily START metoprolol succinate 12.535mdaily  *If you need a refill on your cardiac medications before your next appointment, please call your pharmacy*   Follow-Up: [facility] are our priority.  As part of our continuing mission to provide you with exceptional heart care, we have created designated Provider Care Teams.  These Care Teams include your primary Cardiologist (physician) and Advanced Practice Providers (APPs -  Physician Assistants and Nurse Practitioners) who all work together to provide you with the care you need, when you need it.  We recommend signing up for the patient portal called "MyChart".  Sign up information is provided on this After Visit Summary.  MyChart is used to connect with patients for Virtual Visits (Telemedicine).  Patients are able to view lab/test results, encounter notes, upcoming appointments, etc.  Non-urgent messages can be sent to your provider as well.   To learn more about what you can do with MyChart, go to [email or web address]   Your next appointment:   6 month(s)  The format for your next appointment:   In Person  Provider:   KePixie CasinoMD {

## 2022-01-12 ENCOUNTER — Encounter: Payer: Self-pay | Admitting: Orthopaedic Surgery

## 2022-01-30 ENCOUNTER — Ambulatory Visit: Payer: Medicare PPO | Admitting: Physician Assistant

## 2022-01-30 ENCOUNTER — Ambulatory Visit (INDEPENDENT_AMBULATORY_CARE_PROVIDER_SITE_OTHER): Payer: Medicare PPO

## 2022-01-30 ENCOUNTER — Encounter: Payer: Self-pay | Admitting: Physician Assistant

## 2022-01-30 DIAGNOSIS — S92354A Nondisplaced fracture of fifth metatarsal bone, right foot, initial encounter for closed fracture: Secondary | ICD-10-CM

## 2022-01-30 DIAGNOSIS — M79672 Pain in left foot: Secondary | ICD-10-CM

## 2022-01-30 DIAGNOSIS — M25572 Pain in left ankle and joints of left foot: Secondary | ICD-10-CM

## 2022-01-30 NOTE — Progress Notes (Signed)
Office Visit Note   Patient: Alyssa [name]           Date of Birth: [birth year]           MRN: [id number] Visit Date: 01/30/2022              Requested by: Nickola Major, MD [address] [place],  [place] [address] PCP: Nickola Major, MD      HPI: Patient is a pleasant 71 year old woman with a chief complaint of right lateral foot pain.  She states that she rolled her foot yesterday.  She had immediate onset of pain across the lateral side of her foot.  Initially she could not bear weight and was using a walker.  She is using a cane today.  Assessment & Plan: Visit Diagnoses:  1. Pain in left ankle and joints of left foot   2. Pain in left foot   3. Nondisplaced fracture of fifth metatarsal bone, right foot, initial encounter for closed fracture     Plan: Right fifth metatarsal shaft fracture.  Also cannot rule out small avulsion fracture at the base of the fifth metatarsal.  She will go into a short cam boot.  I think she should be reimaged in a week and reexamined as she is difficult to examine given the acuteness of the injury.  She has seen Dr. Erlinda Hong in the past and her husband has a scheduled appointment with him next Tuesday to discuss knee replacement.  I think it would be fine for her to follow-up with him as it would be more convenient as her husband is having to drive  Follow-Up Instructions: No follow-ups on file.   Ortho Exam  Patient is alert, oriented, no adenopathy, well-dressed, normal affect, normal respiratory effort. Examination of her right foot she has moderate ecchymosis focused on the dorsal lateral side of the foot.  She has good ankle dorsiflexion and plantarflexion she has difficulty with eversion slight better with inversion.  Her toes are warm and pink pulses are palpable she has limited motion secondary to pain.  She has no pain with manipulation of her midfoot no pain over the Lisfranc complex.  She has no plantar ecchymosis  Imaging: No  results found. No images are attached to the encounter.  Labs: Lab Results  Component Value Date   HGBA1C 6.0 (H) 01/14/2020   HGBA1C 6.8 (H) 12/10/2015   REPTSTATUS 10/15/2011 FINAL 10/12/2011   CULT  10/12/2011    NO SALMONELLA, SHIGELLA, CAMPYLOBACTER, OR YERSINIA ISOLATED     Lab Results  Component Value Date   ALBUMIN 4.0 01/14/2020   ALBUMIN 4.6 12/31/2018   ALBUMIN 4.5 10/08/2018    No results found for: "MG" No results found for: "VD25OH"  No results found for: "PREALBUMIN"    Latest Ref Rng & Units 01/14/2020    8:40 AM 10/08/2018   12:45 PM 11/05/2017   10:47 AM  CBC EXTENDED  WBC 4.0 - 10.5 K/uL 5.1  5.3  6.0   RBC 3.87 - 5.11 MIL/uL 5.15  5.37  5.31   Hemoglobin 12.0 - 15.0 g/dL 14.4  14.8  15.0   HCT 36.0 - 46.0 % 45.8  47.0  46.1   Platelets 150 - 400 K/uL 280  280  246   NEUT# 1.7 - 7.7 K/uL  2.4  2.8   Lymph# 0.7 - 4.0 K/uL  2.3  2.5      There is no height or weight on file  to calculate BMI.  Orders:  Orders Placed This Encounter  Procedures   XR Foot Complete Left   XR Ankle Complete Left   No orders of the defined types were placed in this encounter.    Procedures: No procedures performed  Clinical Data: No additional findings.  ROS:  All other systems negative, except as noted in the HPI. Review of Systems  Objective: Vital Signs: There were no vitals taken for this visit.  Specialty Comments:  No specialty comments available.  PMFS History: Patient Active Problem List   Diagnosis Date Noted   Nondisplaced fracture of fifth metatarsal bone, right foot, initial encounter for closed fracture 01/30/2022   Pain in left shoulder 01/02/2022   Spondylosis of cervical region without myelopathy or radiculopathy 01/16/2020   Other spondylosis with radiculopathy, cervical region 06/13/2018   GERD (gastroesophageal reflux disease) 04/05/2018   Allergic rhinitis 04/05/2018   Upper airway cough syndrome 04/05/2018   Patellar  tendinitis, right knee 03/01/2017   Status post total right knee replacement 12/22/2015   Hepatic steatosis 02/09/2015   Hot flashes 09/09/2014   Osteopenia 09/09/2014   Malignant neoplasm of upper-inner quadrant of left breast in female, estrogen receptor positive ([place]) 07/23/2013   Memory loss 07/14/2013   Headache 07/14/2013   Other malaise and fatigue 07/14/2013   Acute pulmonary embolism ([place]) 10/07/2011   Leukocytosis 10/07/2011   Thyroid mass, left inferior lobe, 3.6cm Mar2013 10/07/2011   Hyponatremia 10/07/2011   Obesity (BMI 30-39.9) 08/07/2011   Post concussion syndrome    Past Medical History:  Diagnosis Date   Abdominal pain    Anxiety    Arthritis    Asthma    Breast cancer ([place]) 06/2013   left upper inner   Cancer ([place])    breast   Diabetes mellitus without complication ([place])    Takes Tradjenta   Family history of adverse reaction to anesthesia    Patients mother had to be resusciated after having anesthesia   GERD (gastroesophageal reflux disease)    Headache(784.0)    Takes Depakote   Hernia, incisional periumbilical, incarcerated 1/95/0932   Hot flashes    Hx of radiation therapy 09/09/13- 10/13/13   left breast 5000 cGy in 25 sessions, no boost   Hyperlipidemia    Hypertension    PCP DR Nolon Rod   Hypothyroidism    Non-alcoholic fatty liver disease    Peripheral vascular disease ([place])    pulmonary embolis-still have some   Post concussion syndrome    4 yrs ago- sees [facility]- Dr Leonie Man every 6 months-   has sleep study 2011 following accident but was neg per patient-  short term memory issues, dates and times   Pulmonary embolism ([place]) 2013   Recurrent upper respiratory infection (URI)    FLU 08/23/11-08/29/11 then URI 08/29/11- 09/08/11- S/P zpack and steroids-  improved      no cough or congestion   Thyroid disease    Thyroid mass, left inferior lobe, 3.6cm [id number] 10/07/2011   Ventral hernia     Family History  Problem Relation Age of Onset    Malignant hyperthermia Mother    Heart attack Father    Stroke Father    Diabetes Other        Grandmother   Other Other        respiratory- Grandmother   Cancer Maternal Grandmother 85       breast   Breast cancer Maternal Grandmother     Past Surgical History:  Procedure  Laterality Date   ANTERIOR CERVICAL DECOMP/DISCECTOMY FUSION N/A 01/16/2020   Procedure: c5-6, c6-7 ANTERIOR CERVICAL DECOMPRESSION/DISCECTOMY FUSION, ALLOGRAFT AND PLATE;  Surgeon: Marybelle Killings, MD;  [place]  Service: Orthopedics;  Laterality: N/A;   APPENDECTOMY  2002   lap appy.  Dr. Hassell Done   BREAST BIOPSY     BREAST LUMPECTOMY Left 2015   radiation   BREAST LUMPECTOMY WITH NEEDLE LOCALIZATION AND AXILLARY SENTINEL LYMPH NODE BX Left 08/04/2013   Procedure: BREAST LUMPECTOMY WITH NEEDLE LOCALIZATION AND AXILLARY SENTINEL LYMPH NODE Biopsy x 3;  Surgeon: Rolm Bookbinder, MD;  Location: [place];  Service: General;  Laterality: Left;   BREAST SURGERY     CARPAL TUNNEL RELEASE  10/2009-12/2010   left - right   CESAREAN SECTION  X 2   COLONOSCOPY     DIAGNOSTIC LAPAROSCOPY     Lap. chole., appendectomy, hernia repair   HERNIA REPAIR  09/15/11   Ventral w/mesh   JOINT REPLACEMENT     KNEE ARTHROPLASTY Right 12/22/2015   Procedure: COMPUTER ASSISTED TOTAL KNEE ARTHROPLASTY;  Surgeon: Marybelle Killings, MD;  Location: [place];  Service: Orthopedics;  Laterality: Right;   LAPAROSCOPIC CHOLECYSTECTOMY  2006   Dr. Gean Maidens TUNNEL RELEASE Bilateral    THYROIDECTOMY, PARTIAL     VENTRAL HERNIA REPAIR  09/29/2011   Procedure: LAPAROSCOPIC VENTRAL HERNIA;  Surgeon: Adin Hector, MD;  [facility];  Service: General;  Laterality: N/A;  Laparoscopic Ventral Wall Hernia Repair with Mesh   Social History   Occupational History    Employer: [employer]  Tobacco Use   Smoking status: Never   Smokeless tobacco: Never  Vaping Use   Vaping Use: Never used  Substance and Sexual Activity   Alcohol  use: No    Alcohol/week: 1.0 standard drink of alcohol    Types: 1 Glasses of wine per week   Drug use: No   Sexual activity: Yes    Comment: Menarche age 67, first live birth age 56,  [place] P2. She stopped having periods approximately 1999 and took HRT until 2005.

## 2022-02-01 ENCOUNTER — Ambulatory Visit: Payer: Medicare PPO | Admitting: Orthopaedic Surgery

## 2022-02-07 ENCOUNTER — Encounter: Payer: Self-pay | Admitting: Orthopaedic Surgery

## 2022-02-07 ENCOUNTER — Ambulatory Visit: Payer: Medicare PPO | Admitting: Orthopaedic Surgery

## 2022-02-07 DIAGNOSIS — S92352D Displaced fracture of fifth metatarsal bone, left foot, subsequent encounter for fracture with routine healing: Secondary | ICD-10-CM | POA: Diagnosis not present

## 2022-02-07 NOTE — Progress Notes (Signed)
Office Visit Note   Patient: Alyssa [name]           Date of Birth: [birth year]           MRN: [id number] Visit Date: 02/07/2022              Requested by: Nickola Major, MD [id number] [place],  [place] [address] PCP: Nickola Major, MD   Assessment & Plan: Visit Diagnoses:  1. Closed displaced fracture of fifth metatarsal bone of left foot with routine healing, subsequent encounter     Plan: Impression is left foot fifth metatarsal shaft fracture and small avulsion to the base of the fifth metatarsal in addition to probable left ankle sprain.  At this point, she will continue in her cam walker.  Ice and elevate for pain and swelling.  Follow-up in 3 weeks time for repeat evaluation and three-view x-rays of left foot.  Call with concerns or questions.  Follow-Up Instructions: Return in about 3 weeks (around 02/28/2022).   Orders:  No orders of the defined types were placed in this encounter.  No orders of the defined types were placed in this encounter.     Procedures: No procedures performed   Clinical Data: No additional findings.   Subjective: Chief Complaint  Patient presents with   Right Foot - Pain    HPI patient is a pleasant 71 year old female who comes in today following an injury to her left foot.  On 01/28/2022, she went to stand up after sitting for a while when she rolled her ankle due to her feet being numb.  She had increased pain, swelling and ecchymosis over the next few days and was seen in our office on 01/30/2022.  X-rays demonstrated a left foot fifth metatarsal shaft fracture as well as a small avulsion to the base of the fifth metatarsal.  She is placed in a short cam walker.  She is here today for follow-up.  Her pain has slightly improved.  She has been compliant wearing her cam walker.  She is not taking anything for this.     Objective: Vital Signs: There were no vitals taken for this visit.    Ortho Exam left foot exam shows  mild swelling and ecchymosis throughout the lateral aspect.  Moderate tenderness to the fracture site.  Moderate tenderness to the ATFL.  Painless range of motion of the ankle.  No tenderness to the lateral malleolus.  She is neurovascular intact distally.  Specialty Comments:  No specialty comments available.  Imaging: No new imaging   PMFS History: Patient Active Problem List   Diagnosis Date Noted   Nondisplaced fracture of fifth metatarsal bone, right foot, initial encounter for closed fracture 01/30/2022   Pain in left shoulder 01/02/2022   Spondylosis of cervical region without myelopathy or radiculopathy 01/16/2020   Other spondylosis with radiculopathy, cervical region 06/13/2018   GERD (gastroesophageal reflux disease) 04/05/2018   Allergic rhinitis 04/05/2018   Upper airway cough syndrome 04/05/2018   Patellar tendinitis, right knee 03/01/2017   Status post total right knee replacement 12/22/2015   Hepatic steatosis 02/09/2015   Hot flashes 09/09/2014   Osteopenia 09/09/2014   Malignant neoplasm of upper-inner quadrant of left breast in female, estrogen receptor positive ([place]) 07/23/2013   Memory loss 07/14/2013   Headache 07/14/2013   Other malaise and fatigue 07/14/2013   Acute pulmonary embolism ([place]) 10/07/2011   Leukocytosis 10/07/2011   Thyroid mass, left inferior lobe, 3.6cm [id number] 10/07/2011  Hyponatremia 10/07/2011   Obesity (BMI 30-39.9) 08/07/2011   Post concussion syndrome    Past Medical History:  Diagnosis Date   Abdominal pain    Anxiety    Arthritis    Asthma    Breast cancer ([facility]) 06/2013   left upper inner   Cancer ([place])    breast   Diabetes mellitus without complication ([place])    Takes Tradjenta   Family history of adverse reaction to anesthesia    Patients mother had to be resusciated after having anesthesia   GERD (gastroesophageal reflux disease)    Headache(784.0)    Takes Depakote   Hernia, incisional periumbilical, incarcerated  08/07/2011   Hot flashes    Hx of radiation therapy 09/09/13- 10/13/13   left breast 5000 cGy in 25 sessions, no boost   Hyperlipidemia    Hypertension    PCP DR Nolon Rod   Hypothyroidism    Non-alcoholic fatty liver disease    Peripheral vascular disease ([place])    pulmonary embolis-still have some   Post concussion syndrome    4 yrs ago- sees [facility]- Dr Leonie Man every 6 months-   has sleep study 2011 following accident but was neg per patient-  short term memory issues, dates and times   Pulmonary embolism ([facility]) 2013   Recurrent upper respiratory infection (URI)    FLU 08/23/11-08/29/11 then URI 08/29/11- 09/08/11- S/P zpack and steroids-  improved      no cough or congestion   Thyroid disease    Thyroid mass, left inferior lobe, 3.6cm [id number] 10/07/2011   Ventral hernia     Family History  Problem Relation Age of Onset   Malignant hyperthermia Mother    Heart attack Father    Stroke Father    Diabetes Other        Grandmother   Other Other        respiratory- Grandmother   Cancer Maternal Grandmother 85       breast   Breast cancer Maternal Grandmother     Past Surgical History:  Procedure Laterality Date   ANTERIOR CERVICAL DECOMP/DISCECTOMY FUSION N/A 01/16/2020   Procedure: c5-6, c6-7 ANTERIOR CERVICAL DECOMPRESSION/DISCECTOMY FUSION, ALLOGRAFT AND PLATE;  Surgeon: Marybelle Killings, MD;  Location: [facility];  Service: Orthopedics;  Laterality: N/A;   APPENDECTOMY  2002   lap appy.  Dr. Hassell Done   BREAST BIOPSY     BREAST LUMPECTOMY Left 2015   radiation   BREAST LUMPECTOMY WITH NEEDLE LOCALIZATION AND AXILLARY SENTINEL LYMPH NODE BX Left 08/04/2013   Procedure: BREAST LUMPECTOMY WITH NEEDLE LOCALIZATION AND AXILLARY SENTINEL LYMPH NODE Biopsy x 3;  Surgeon: Rolm Bookbinder, MD;  Location: [facility];  Service: General;  Laterality: Left;   BREAST SURGERY     CARPAL TUNNEL RELEASE  10/2009-12/2010   left - right   CESAREAN SECTION  X 2   COLONOSCOPY     DIAGNOSTIC LAPAROSCOPY      Lap. chole., appendectomy, hernia repair   HERNIA REPAIR  09/15/11   Ventral w/mesh   JOINT REPLACEMENT     KNEE ARTHROPLASTY Right 12/22/2015   Procedure: COMPUTER ASSISTED TOTAL KNEE ARTHROPLASTY;  Surgeon: Marybelle Killings, MD;  Location: [facility];  Service: Orthopedics;  Laterality: Right;   LAPAROSCOPIC CHOLECYSTECTOMY  2006   Dr. Gean Maidens TUNNEL RELEASE Bilateral    THYROIDECTOMY, PARTIAL     VENTRAL HERNIA REPAIR  09/29/2011   Procedure: LAPAROSCOPIC VENTRAL HERNIA;  Surgeon: Adin Hector, MD;  Location[facility];  Service: General;  Laterality: N/A;  Laparoscopic Ventral Wall Hernia Repair with Mesh   Social History   Occupational History    Employer: [employer]  Tobacco Use   Smoking status: Never   Smokeless tobacco: Never  Vaping Use   Vaping Use: Never used  Substance and Sexual Activity   Alcohol use: No    Alcohol/week: 1.0 standard drink of alcohol    Types: 1 Glasses of wine per week   Drug use: No   Sexual activity: Yes    Comment: Menarche age 41, first live birth age 10,  [place] P2. She stopped having periods approximately 1999 and took HRT until 2005.

## 2022-02-22 NOTE — Progress Notes (Signed)
Cardiology Clinic Note   Patient Name: Alyssa [name] Date of Encounter: 02/24/2022  Primary Care Provider:  Nickola Major, MD Primary Cardiologist:  Pixie Casino, MD  Patient Profile    Alyssa [name] 71 year old female presents to the clinic today for follow-up evaluation of her paroxysmal atrial fibrillation and hypertension.  Past Medical History    Past Medical History:  Diagnosis Date   Abdominal pain    Anxiety    Arthritis    Asthma    Breast cancer ([facility]) 06/2013   left upper inner   Cancer ([place])    breast   Diabetes mellitus without complication ([place])    Takes Tradjenta   Family history of adverse reaction to anesthesia    Patients mother had to be resusciated after having anesthesia   GERD (gastroesophageal reflux disease)    Headache(784.0)    Takes Depakote   Hernia, incisional periumbilical, incarcerated 6/80/3212   Hot flashes    Hx of radiation therapy 09/09/13- 10/13/13   left breast 5000 cGy in 25 sessions, no boost   Hyperlipidemia    Hypertension    PCP DR Nolon Rod   Hypothyroidism    Non-alcoholic fatty liver disease    Peripheral vascular disease ([place])    pulmonary embolis-still have some   Post concussion syndrome    4 yrs ago- sees [facility]- Dr Leonie Man every 6 months-   has sleep study 2011 following accident but was neg per patient-  short term memory issues, dates and times   Pulmonary embolism ([facility]) 2013   Recurrent upper respiratory infection (URI)    FLU 08/23/11-08/29/11 then URI 08/29/11- 09/08/11- S/P zpack and steroids-  improved      no cough or congestion   Thyroid disease    Thyroid mass, left inferior lobe, 3.6cm [id number] 10/07/2011   Ventral hernia    Past Surgical History:  Procedure Laterality Date   ANTERIOR CERVICAL DECOMP/DISCECTOMY FUSION N/A 01/16/2020   Procedure: c5-6, c6-7 ANTERIOR CERVICAL DECOMPRESSION/DISCECTOMY FUSION, ALLOGRAFT AND PLATE;  Surgeon: Marybelle Killings, MD;  Location: [facility];  Service: Orthopedics;   Laterality: N/A;   APPENDECTOMY  2002   lap appy.  Dr. Hassell Done   BREAST BIOPSY     BREAST LUMPECTOMY Left 2015   radiation   BREAST LUMPECTOMY WITH NEEDLE LOCALIZATION AND AXILLARY SENTINEL LYMPH NODE BX Left 08/04/2013   Procedure: BREAST LUMPECTOMY WITH NEEDLE LOCALIZATION AND AXILLARY SENTINEL LYMPH NODE Biopsy x 3;  Surgeon: Rolm Bookbinder, MD;  Location: [facility];  Service: General;  Laterality: Left;   BREAST SURGERY     CARPAL TUNNEL RELEASE  10/2009-12/2010   left - right   CESAREAN SECTION  X 2   COLONOSCOPY     DIAGNOSTIC LAPAROSCOPY     Lap. chole., appendectomy, hernia repair   HERNIA REPAIR  09/15/11   Ventral w/mesh   JOINT REPLACEMENT     KNEE ARTHROPLASTY Right 12/22/2015   Procedure: COMPUTER ASSISTED TOTAL KNEE ARTHROPLASTY;  Surgeon: Marybelle Killings, MD;  Location: [facility];  Service: Orthopedics;  Laterality: Right;   LAPAROSCOPIC CHOLECYSTECTOMY  2006   Dr. Gean Maidens TUNNEL RELEASE Bilateral    THYROIDECTOMY, PARTIAL     VENTRAL HERNIA REPAIR  09/29/2011   Procedure: LAPAROSCOPIC VENTRAL HERNIA;  Surgeon: Adin Hector, MD;  [facility];  Service: General;  Laterality: N/A;  Laparoscopic Ventral Wall Hernia Repair with Mesh    Allergies  Allergies  Allergen Reactions   Aleve [  Naproxen Sodium] Hives, Itching and Swelling   Aromasin [Exemestane] Anaphylaxis, Itching and Swelling    Pt currently tolerating medication well (02/03/16)-gwd; Pt stated "I can take this medication for a few months at a time and I take a benadryl when I feel it starting to come on" Patient has facial swelling, itching, and throat swelling  07/12/17  Taking this right now and no problem. sy/rn   Letrozole Shortness Of Breath, Rash and Other (See Comments)    Swelling - feet, eyes,hands;   Speech garbled   Levofloxacin Rash and Swelling    Facial swelling and red facial rash   Penicillins Hives, Swelling and Rash    All over the body. Has patient had a PCN reaction causing immediate  rash, facial/tongue/throat swelling, SOB or lightheadedness with hypotension: Yes Has patient had a PCN reaction causing severe rash involving mucus membranes or skin necrosis: No Has patient had a PCN reaction that required hospitalization No Has patient had a PCN reaction occurring within the last 10 years: No If all of the above answers are "NO", then may proceed with Cephalosporin use.    Symbicort [Budesonide-Formoterol Fumarate] Anaphylaxis, Hives, Swelling and Rash   Codeine Other (See Comments)    hallucinations   Percocet [Oxycodone-Acetaminophen] Other (See Comments)    Causes syncope day after medication has been taken.   Vicodin [Hydrocodone-Acetaminophen] Other (See Comments)    Causes syncope day after medication has been taken.   Doxycycline Swelling    Must with  antihistamine   Ezetimibe Other (See Comments)    Myalgias,fatigue   Prednisone Other (See Comments)    Can take with antihistamine/ can take the shot without antihistamine   Cefdinir Rash and Swelling   Losartan Rash    History of Present Illness    Alyssa [name] has a PMH of acute pulmonary embolism, allergic rhinitis, GERD, hepatic steatosis, osteopenia, obesity, paroxysmal atrial fibrillation, fatigue, breast CA, left shoulder pain, familial hyperlipidemia, coronary calcification, uncontrolled hypertension, and upper airway cough syndrome.  She was seen by Dr. Debara Pickett.  She was referred to the lipid clinic by her PCP.  She reported that her cholesterol has been elevated since she was in her 36s.  Both of her brothers had elevated cholesterol over 400.  Her mother also had elevated cholesterol and her father died from heart disease at age 9.  She reported statin intolerance and had been on several cholesterol-lowering medications including Crestor, Lipitor, lovastatin, and ezetimibe.  Her cholesterol was noted to be 343, triglycerides 154, HDL 40, and LDL 272.  She has lost around 40 pounds while following a  ketogenic diet.  She reported that she has been taking Crestor but noted significant pain with that and finally discontinued the medication.  In 2013 she had a pulmonary embolus.  Her CT was reviewed and did show multivessel coronary disease.  She was seen in follow-up on 01/04/2022 by Dr. Debara Pickett.  She was responding well to [place].  Her LDL cholesterol was 41.  She reported that her blood pressure continued to be uncontrolled.  She had been started on amlodipine 5 mg daily.  Her blood pressures at home ranged in the 140-1 160/80 range.  She reported rash with ARB type medications.  Her heart rate was noted to be 57.  She indicated that she was noticing intermittent periods of palpitations with no clear cause.  She was wearing an Apple Watch and had done EKGs.  She reported that interpretation noted suggestions for atrial fibrillation.  They were reviewed and indicated that she was having multiple alerts indicating atrial fibrillation.  Her CHA2DS2-VASc score was 3 for age, gender, and hypertension.  She was started on metoprolol 12.5 mg daily, amlodipine, she denied OSA.  She was started on apixaban 5 mg daily and follow-up was planned for 6 months.  She presents to clinic today for follow-up evaluation states she has noticed frequent episodes of accelerated heart rate.  She has been tracking the heart rates with her Apple Watch and iPhone.  She also has noticed that her average blood pressure has been 481-856 range systolic.  Initially in the office today her blood pressure is 142/90.  On recheck her blood pressure is 138/88.  We reviewed her medication regimen and she expressed understanding.  We reviewed her CVA risk.  I will have her take an extra dose of metoprolol for sustained elevated heart rate that lasts more than 45 minutes.  I will also increase her amlodipine to 5 mg in the morning and at 2.5 mg in the evening.  She reports compliance with apixaban and denies bleeding issues.  We will plan follow-up  as scheduled with Dr. Debara Pickett.  I have asked her to continue her physical activity and avoid triggers for atrial fibrillation.  She expressed understanding.  Today she denies chest pain, shortness of breath, lower extremity edema, fatigue,  melena, hematuria, hemoptysis, diaphoresis, weakness, presyncope, syncope, orthopnea, and PND.    Home Medications    Prior to Admission medications   Medication Sig Start Date End Date Taking? Authorizing Provider  albuterol (VENTOLIN HFA) 108 (90 Base) MCG/ACT inhaler Inhale 2 puffs into the lungs every 6 (six) hours as needed for wheezing or shortness of breath.  12/17/19   [provider]  amLODipine (NORVASC) 5 MG tablet Take 5 mg by mouth daily.    [provider]  amphetamine-dextroamphetamine (ADDERALL) 20 MG tablet Take 20 mg by mouth See admin instructions. Take 51m in the morning and 233min the afternoon.    [provider]  apixaban (ELIQUIS) 5 MG TABS tablet Take 1 tablet (5 mg total) by mouth 2 (two) times daily. 01/04/22   Hilty, KeNadean CorwinMD  cetirizine (ZYRTEC) 10 MG tablet Take by mouth.    [provider]  levothyroxine (SYNTHROID) 137 MCG tablet 1 tablet in the morning on an empty stomach 11/09/21   [provider]  metoprolol succinate (TOPROL-XL) 25 MG 24 hr tablet Take 0.5 tablets (12.5 mg total) by mouth daily. D/C diltiazem from med list. Thanks 01/04/22   HiPixie CasinoMD  omeprazole (PRILOSEC) 40 MG capsule TAKE 1 CAPSULE BY MOUTH DAILY 03/20/19   RaBrand MalesMD  oxybutynin (DITROPAN-XL) 10 MG 24 hr tablet Take 10 mg by mouth daily.  05/14/18   [provider]  potassium chloride (KLOR-CON) 10 MEQ tablet TAKE 1 TABLET(10 MEQ) BY MOUTH DAILY 06/28/21   Hilty, KeNadean CorwinMD  REPATHA SURECLICK 14314G/ML SOAJ INJECT 1 DOSE INTO THE SKIN EVERY 14 DAYS 08/22/21   Hilty, KeNadean CorwinMD  Semaglutide (OZEMPIC, 0.25 OR 0.5 MG/DOSE, [place]) Inject 1 mg into the skin once a week.     [provider]    Family History    Family History  Problem Relation Age of Onset   Malignant hyperthermia Mother    Heart attack Father    Stroke Father    Diabetes Other        Grandmother   Other Other  respiratory- Grandmother   Cancer Maternal Grandmother 42       breast   Breast cancer Maternal Grandmother    She indicated that her mother is alive. She indicated that her father is deceased. She indicated that her brother is alive. She indicated that the status of her maternal grandmother is unknown. She indicated that the status of her other is unknown.  Social History    Social History   Socioeconomic History   Marital status: Divorced    Spouse name: Not on file   Number of children: 2   Years of education: Master's   Highest education level: Not on file  Occupational History    Employer: [employer]  Tobacco Use   Smoking status: Never   Smokeless tobacco: Never  Vaping Use   Vaping Use: Never used  Substance and Sexual Activity   Alcohol use: No    Alcohol/week: 1.0 standard drink of alcohol    Types: 1 Glasses of wine per week   Drug use: No   Sexual activity: Yes    Comment: Menarche age 60, first live birth age 42,  [place] P2. She stopped having periods approximately 1999 and took HRT until 2005.  Other Topics Concern   Not on file  Social History Narrative   Patient is divorced and lives alone.   Patient drinks 3-4 cups of caffeine daily.    Patient is right handed.   Social Determinants of Health   Financial Resource Strain: Not on file  Food Insecurity: Not on file  Transportation Needs: Not on file  Physical Activity: Not on file  Stress: Not on file  Social Connections: Not on file  Intimate Partner Violence: Not on file     Review of Systems    General:  No chills, fever, night sweats or weight changes.  Cardiovascular:  No chest pain, dyspnea on exertion, edema, orthopnea, palpitations, paroxysmal nocturnal  dyspnea. Dermatological: No rash, lesions/masses Respiratory: No cough, dyspnea Urologic: No hematuria, dysuria Abdominal:   No nausea, vomiting, diarrhea, bright red blood per rectum, melena, or hematemesis Neurologic:  No visual changes, wkns, changes in mental status. All other systems reviewed and are otherwise negative except as noted above.  Physical Exam    VS:  BP 138/88   Pulse (!) 56   Ht 5' 2"  (1.575 m)   Wt 197 lb (89.4 kg) Comment: 01-04-2022 last weighed  SpO2 98%   BMI 36.03 kg/m  , BMI Body mass index is 36.03 kg/m. GEN: Well nourished, well developed, in no acute distress. HEENT: normal. Neck: Supple, no JVD, carotid bruits, or masses. Cardiac: Irregularly irregular, no murmurs, rubs, or gallops. No clubbing, cyanosis, edema.  Radials/DP/PT 2+ and equal bilaterally.  Respiratory:  Respirations regular and unlabored, clear to auscultation bilaterally. GI: Soft, nontender, nondistended, BS + x 4. MS: no deformity or atrophy. Skin: warm and dry, no rash. Neuro:  Strength and sensation are intact. Psych: Normal affect.  Accessory Clinical Findings    Recent Labs: No results found for requested labs within last 365 days.   Recent Lipid Panel    Component Value Date/Time   CHOL 114 12/22/2021 1015   TRIG 104 12/22/2021 1015   HDL 54 12/22/2021 1015   CHOLHDL 2.1 12/22/2021 1015   LDLCALC 41 12/22/2021 1015    ECG personally reviewed by me today-none today.  EKG 01/04/2022 Sinus bradycardia 57 bpm  Assessment & Plan   1.  Essential hypertension-BP today 138/88.  Well-controlled at home.  Continue amlodipine, metoprolol Change amlodipine to 5 mg in a.m. and 2.5 mg in p.m. Heart healthy low-sodium diet-salty 6 given Increase physical activity as tolerated  Paroxysmal atrial fibrillation-cardiac unaware.  Continues to note intermittent episodes of atrial fibrillation with Apple Watch.  Reports compliance with apixaban and denies bleeding issues. Continue  apixaban, metoprolol May take an extra dose of 12.5 mg metoprolol for sustained elevated heart rate over 100. Heart healthy low-sodium diet-salty 6 given Increase physical activity as tolerated  Familial hyperlipidemia-12/22/2021: Cholesterol, Total 114; HDL 54; LDL Chol Calc (NIH) 41; Triglycerides 104.  Statin intolerance. Continue Repatha Heart healthy low-sodium high-fiber diet Increase physical activity as tolerated Continue weight loss  Coronary artery disease-denies chest pain today.  Noted to have multivessel coronary disease on chest CT. Continue metoprolol, Repatha Heart healthy low-sodium diet Increase physical activity as tolerated   Disposition: Follow-up with Dr. Debara Pickett as scheduled.  Jossie Ng. Cormick Moss NP-C     02/24/2022, 10:21 AM [address] Office [phone] Fax [phone]  Notice: This dictation was prepared with Dragon dictation along with smaller phrase technology. Any transcriptional errors that result from this process are unintentional and may not be corrected upon review.  I spent 14 minutes examining this patient, reviewing medications, and using patient centered shared decision making involving her cardiac care.  Prior to her visit I spent greater than 20 minutes reviewing her past medical history,  medications, and prior cardiac tests.

## 2022-02-24 ENCOUNTER — Ambulatory Visit: Payer: Medicare PPO | Admitting: General Practice

## 2022-02-24 ENCOUNTER — Encounter: Payer: Self-pay | Admitting: General Practice

## 2022-02-24 VITALS — BP 138/88 | HR 56 | Ht 62.0 in | Wt 197.0 lb

## 2022-02-24 DIAGNOSIS — I480 Paroxysmal atrial fibrillation: Secondary | ICD-10-CM

## 2022-02-24 DIAGNOSIS — I10 Essential (primary) hypertension: Secondary | ICD-10-CM

## 2022-02-24 DIAGNOSIS — I2510 Atherosclerotic heart disease of native coronary artery without angina pectoris: Secondary | ICD-10-CM

## 2022-02-24 DIAGNOSIS — I2584 Coronary atherosclerosis due to calcified coronary lesion: Secondary | ICD-10-CM

## 2022-02-24 DIAGNOSIS — E7849 Other hyperlipidemia: Secondary | ICD-10-CM

## 2022-02-24 MED ORDER — AMLODIPINE BESYLATE 5 MG PO TABS: ORAL_TABLET | ORAL | 6 refills | Status: DC

## 2022-02-24 MED ORDER — METOPROLOL SUCCINATE ER 25 MG PO TB24: 12.5000 mg | ORAL_TABLET | Freq: Every day | ORAL | 3 refills | Status: DC

## 2022-02-24 NOTE — Patient Instructions (Signed)
Medication Instructions:  TAKE AMLODIPINE 5MG-AM AND 2.5MG-PM  MAY TAKE EXTRA METOPROLOL 1/2 TAB- SUSTAINED HR >100 FOR AT LEAST 45 MIN  *If you need a refill on your cardiac medications before your next appointment, please call your pharmacy*  Lab Work:   Testing/Procedures:  NONE    NONE  If you have labs (blood work) drawn today and your tests are completely normal, you will receive your results only by: [facility] (if you have MyChart) OR  A paper copy in the mail If you have any lab test that is abnormal or we need to change your treatment, we will call you to review the results.  Special Instructions TAKE AND LOG YOU BLOOD PRESSURE/HEART RATE  PLEASE MAINTAIN PHYSICAL ACTIVITY AS TOLERATED   CONTINUE CURRENT DIET  Please try to avoid these triggers-PALPITATIONS: Do not use any products that have nicotine or tobacco in them. These include cigarettes, e-cigarettes, and chewing tobacco. If you need help quitting, ask your doctor. Eat heart-healthy foods. Talk with your doctor about the right eating plan for you. Exercise regularly as told by your doctor. Stay hydrated Do not drink alcohol, Caffeine or chocolate. Lose weight if you are overweight. Do not use drugs, including cannabis   Follow-Up: Your next appointment:  KEEP SCHEDULED  APPOINTMENT  In Person with Pixie Casino, MD    At [facility], you and your health needs are our priority.  As part of our continuing mission to provide you with exceptional heart care, we have created designated Provider Care Teams.  These Care Teams include your primary Cardiologist (physician) and Advanced Practice Providers (APPs -  Physician Assistants and Nurse Practitioners) who all work together to provide you with the care you need, when you need it.  Important Information About Sugar

## 2022-03-02 ENCOUNTER — Ambulatory Visit: Payer: Medicare PPO | Admitting: Orthopaedic Surgery

## 2022-03-02 ENCOUNTER — Ambulatory Visit (INDEPENDENT_AMBULATORY_CARE_PROVIDER_SITE_OTHER): Payer: Medicare PPO

## 2022-03-02 DIAGNOSIS — S92352D Displaced fracture of fifth metatarsal bone, left foot, subsequent encounter for fracture with routine healing: Secondary | ICD-10-CM

## 2022-03-02 NOTE — Progress Notes (Signed)
Office Visit Note   Patient: Alyssa [name]           Date of Birth: 1950/09/09           MRN: [id number] Visit Date: 03/02/2022              Requested by: Nickola Major, MD [address] [place],  [place] [address] PCP: Nickola Major, MD   Assessment & Plan: Visit Diagnoses:  1. Closed displaced fracture of fifth metatarsal bone of left foot with routine healing, subsequent encounter     Plan: Impression is approximately 5 weeks status post left foot fifth metatarsal shaft fracture and left ankle sprain.  Patient is doing well.  Her fracture is healing appropriately.  She will continue to weight-bear in a cam boot.  She will follow-up with [place] in 3 weeks time for repeat evaluation and three-view x-rays of the left foot.  Call with concerns or questions in the meantime.  Follow-Up Instructions: Return in about 3 weeks (around 03/23/2022).   Orders:  Orders Placed This Encounter  Procedures   XR Foot Complete Left   No orders of the defined types were placed in this encounter.     Procedures: No procedures performed   Clinical Data: No additional findings.   Subjective: Chief Complaint  Patient presents with   Left Foot - Follow-up    HPI patient is a pleasant 71 year old female who comes in today approximately 5 weeks status post left foot fifth metatarsal shaft fracture and left ankle sprain, date of injury 01/28/2022.  She has been doing well.  She has been compliant wearing her short cam walker.  She has been icing and elevating as needed.  Overall, doing very well.  Review of Systems as detailed in HPI.  All others reviewed and are negative.   Objective: Vital Signs: There were no vitals taken for this visit.  Physical Exam well-developed well-nourished female in no acute distress.  Alert and oriented x 3.  Ortho Exam left foot exam reveals no swelling.  No ecchymosis.  She does have moderate tenderness to the fracture site in addition to the ATFL.   Slight tenderness to the distal fibula.  Painless but limited range of motion of the ankle.  She is neurovascular intact distally.  Specialty Comments:  No specialty comments available.  Imaging: XR Foot Complete Left  Result Date: 03/02/2022 X-rays demonstrate callus formation to the fifth metatarsal fracture with stable alignment.  Fracture lucency remains.      PMFS History: Patient Active Problem List   Diagnosis Date Noted   Nondisplaced fracture of fifth metatarsal bone, right foot, initial encounter for closed fracture 01/30/2022   Pain in left shoulder 01/02/2022   Spondylosis of cervical region without myelopathy or radiculopathy 01/16/2020   Other spondylosis with radiculopathy, cervical region 06/13/2018   GERD (gastroesophageal reflux disease) 04/05/2018   Allergic rhinitis 04/05/2018   Upper airway cough syndrome 04/05/2018   Patellar tendinitis, right knee 03/01/2017   Status post total right knee replacement 12/22/2015   Hepatic steatosis 02/09/2015   Hot flashes 09/09/2014   Osteopenia 09/09/2014   Malignant neoplasm of upper-inner quadrant of left breast in female, estrogen receptor positive ([place]) 07/23/2013   Memory loss 07/14/2013   Headache 07/14/2013   Other malaise and fatigue 07/14/2013   Acute pulmonary embolism ([facility]) 10/07/2011   Leukocytosis 10/07/2011   Thyroid mass, left inferior lobe, 3.6cm [id number] 10/07/2011   Hyponatremia 10/07/2011   Obesity (BMI 30-39.9)  08/07/2011   Post concussion syndrome    Past Medical History:  Diagnosis Date   Abdominal pain    Anxiety    Arthritis    Asthma    Breast cancer ([facility]) 06/2013   left upper inner   Cancer ([facility])    breast   Diabetes mellitus without complication ([facility])    Takes Tradjenta   Family history of adverse reaction to anesthesia    Patients mother had to be resusciated after having anesthesia   GERD (gastroesophageal reflux disease)    Headache(784.0)    Takes Depakote   Hernia, incisional  periumbilical, incarcerated 6/00/4599   Hot flashes    Hx of radiation therapy 09/09/13- 10/13/13   left breast 5000 cGy in 25 sessions, no boost   Hyperlipidemia    Hypertension    PCP DR Nolon Rod   Hypothyroidism    Non-alcoholic fatty liver disease    Peripheral vascular disease ([facility])    pulmonary embolis-still have some   Post concussion syndrome    4 yrs ago- sees [facility]- Dr Leonie Man every 6 months-   has sleep study 2011 following accident but was neg per patient-  short term memory issues, dates and times   Pulmonary embolism ([facility]) 2013   Recurrent upper respiratory infection (URI)    FLU 08/23/11-08/29/11 then URI 08/29/11- 09/08/11- S/P zpack and steroids-  improved      no cough or congestion   Thyroid disease    Thyroid mass, left inferior lobe, 3.6cm [id number] 10/07/2011   Ventral hernia     Family History  Problem Relation Age of Onset   Malignant hyperthermia Mother    Heart attack Father    Stroke Father    Diabetes Other        Grandmother   Other Other        respiratory- Grandmother   Cancer Maternal Grandmother 85       breast   Breast cancer Maternal Grandmother     Past Surgical History:  Procedure Laterality Date   ANTERIOR CERVICAL DECOMP/DISCECTOMY FUSION N/A 01/16/2020   Procedure: c5-6, c6-7 ANTERIOR CERVICAL DECOMPRESSION/DISCECTOMY FUSION, ALLOGRAFT AND PLATE;  Surgeon: Marybelle Killings, MD;  Location: [facility];  Service: Orthopedics;  Laterality: N/A;   APPENDECTOMY  2002   lap appy.  Dr. Hassell Done   BREAST BIOPSY     BREAST LUMPECTOMY Left 2015   radiation   BREAST LUMPECTOMY WITH NEEDLE LOCALIZATION AND AXILLARY SENTINEL LYMPH NODE BX Left 08/04/2013   Procedure: BREAST LUMPECTOMY WITH NEEDLE LOCALIZATION AND AXILLARY SENTINEL LYMPH NODE Biopsy x 3;  Surgeon: Rolm Bookbinder, MD;  Location: [facility];  Service: General;  Laterality: Left;   BREAST SURGERY     CARPAL TUNNEL RELEASE  10/2009-12/2010   left - right   CESAREAN SECTION  X 2   COLONOSCOPY      DIAGNOSTIC LAPAROSCOPY     Lap. chole., appendectomy, hernia repair   HERNIA REPAIR  09/15/11   Ventral w/mesh   JOINT REPLACEMENT     KNEE ARTHROPLASTY Right 12/22/2015   Procedure: COMPUTER ASSISTED TOTAL KNEE ARTHROPLASTY;  Surgeon: Marybelle Killings, MD;  Location: [facility];  Service: Orthopedics;  Laterality: Right;   LAPAROSCOPIC CHOLECYSTECTOMY  2006   Dr. Gean Maidens TUNNEL RELEASE Bilateral    THYROIDECTOMY, PARTIAL     VENTRAL HERNIA REPAIR  09/29/2011   Procedure: LAPAROSCOPIC VENTRAL HERNIA;  Surgeon: Adin Hector, MD;  Location: [facility];  Service: General;  Laterality: N/A;  Laparoscopic Ventral Wall Hernia Repair with Mesh   Social History   Occupational History    Employer: [employer]  Tobacco Use   Smoking status: Never   Smokeless tobacco: Never  Vaping Use   Vaping Use: Never used  Substance and Sexual Activity   Alcohol use: No    Alcohol/week: 1.0 standard drink of alcohol    Types: 1 Glasses of wine per week   Drug use: No   Sexual activity: Yes    Comment: Menarche age 65, first live birth age 41,  [place] P2. She stopped having periods approximately 1999 and took HRT until 2005.

## 2022-03-29 ENCOUNTER — Encounter: Payer: Self-pay | Admitting: Orthopaedic Surgery

## 2022-03-29 ENCOUNTER — Ambulatory Visit: Payer: Medicare PPO | Admitting: Orthopaedic Surgery

## 2022-03-29 ENCOUNTER — Ambulatory Visit (INDEPENDENT_AMBULATORY_CARE_PROVIDER_SITE_OTHER): Payer: Medicare PPO

## 2022-03-29 DIAGNOSIS — S92352D Displaced fracture of fifth metatarsal bone, left foot, subsequent encounter for fracture with routine healing: Secondary | ICD-10-CM | POA: Diagnosis not present

## 2022-03-29 NOTE — Progress Notes (Signed)
Office Visit Note   Patient: Alyssa [name]           Date of Birth: 1951/02/13           MRN: [id number] Visit Date: 03/29/2022              Requested by: Nickola Major, MD [address] [place],  [place] [address] PCP: Nickola Major, MD   Assessment & Plan: Visit Diagnoses:  1. Closed displaced fracture of fifth metatarsal bone of left foot with routine healing, subsequent encounter     Plan: Impression is healing left foot fifth metatarsal fracture.  Patient is clinically and radiographically doing well.  She will try to transition out of the boot into a stiff shoe weightbearing as tolerated.  If this causes discomfort, she will go back into her cam walker and then transition once ready.  She will follow-up with [place] in 6 weeks for repeat evaluation and three-view x-rays of the left foot.  Call with concerns or questions.  Follow-Up Instructions: Return in about 6 weeks (around 05/10/2022).   Orders:  Orders Placed This Encounter  Procedures   XR Foot Complete Left   No orders of the defined types were placed in this encounter.     Procedures: No procedures performed   Clinical Data: No additional findings.   Subjective: Chief Complaint  Patient presents with   Left Foot - Follow-up    Fifth metatarsal fracture DOI 01/28/2022    HPI patient is a pleasant 71 year old female who comes in today approximately 9 weeks status post left foot fifth metatarsal tarsal fracture, date of injury 01/28/2022.  She has been doing well.  She has been compliant wearing a cam walker and is in no pain.  She notes that she is to the bathroom at night wearing a slipper which does not cause pain to her foot.     Objective: Vital Signs: There were no vitals taken for this visit.    Ortho Exam left foot exam reveals mild tenderness at the fracture site.  She is neurovascular intact distally.  Specialty Comments:  No specialty comments available.  Imaging: XR Foot  Complete Left  Result Date: 03/29/2022 X-rays demonstrate continued consolidation of fracture site.  Alignment is unchanged.      PMFS History: Patient Active Problem List   Diagnosis Date Noted   Nondisplaced fracture of fifth metatarsal bone, right foot, initial encounter for closed fracture 01/30/2022   Pain in left shoulder 01/02/2022   Spondylosis of cervical region without myelopathy or radiculopathy 01/16/2020   Other spondylosis with radiculopathy, cervical region 06/13/2018   GERD (gastroesophageal reflux disease) 04/05/2018   Allergic rhinitis 04/05/2018   Upper airway cough syndrome 04/05/2018   Patellar tendinitis, right knee 03/01/2017   Status post total right knee replacement 12/22/2015   Hepatic steatosis 02/09/2015   Hot flashes 09/09/2014   Osteopenia 09/09/2014   Malignant neoplasm of upper-inner quadrant of left breast in female, estrogen receptor positive ([facility]) 07/23/2013   Memory loss 07/14/2013   Headache 07/14/2013   Other malaise and fatigue 07/14/2013   Acute pulmonary embolism ([facility]) 10/07/2011   Leukocytosis 10/07/2011   Thyroid mass, left inferior lobe, 3.6cm Mar2013 10/07/2011   Hyponatremia 10/07/2011   Obesity (BMI 30-39.9) 08/07/2011   Post concussion syndrome    Past Medical History:  Diagnosis Date   Abdominal pain    Anxiety    Arthritis    Asthma    Breast cancer ([facility]) 06/2013  left upper inner   Cancer ([facility])    breast   Diabetes mellitus without complication ([facility])    Takes Tradjenta   Family history of adverse reaction to anesthesia    Patients mother had to be resusciated after having anesthesia   GERD (gastroesophageal reflux disease)    Headache(784.0)    Takes Depakote   Hernia, incisional periumbilical, incarcerated 3/56/7014   Hot flashes    Hx of radiation therapy 09/09/13- 10/13/13   left breast 5000 cGy in 25 sessions, no boost   Hyperlipidemia    Hypertension    PCP DR Nolon Rod   Hypothyroidism    Non-alcoholic  fatty liver disease    Peripheral vascular disease ([place])    pulmonary embolis-still have some   Post concussion syndrome    4 yrs ago- sees [facility]- Dr Leonie Man every 6 months-   has sleep study 2011 following accident but was neg per patient-  short term memory issues, dates and times   Pulmonary embolism ([facility]) 2013   Recurrent upper respiratory infection (URI)    FLU 08/23/11-08/29/11 then URI 08/29/11- 09/08/11- S/P zpack and steroids-  improved      no cough or congestion   Thyroid disease    Thyroid mass, left inferior lobe, 3.6cm [id number] 10/07/2011   Ventral hernia     Family History  Problem Relation Age of Onset   Malignant hyperthermia Mother    Heart attack Father    Stroke Father    Diabetes Other        Grandmother   Other Other        respiratory- Grandmother   Cancer Maternal Grandmother 85       breast   Breast cancer Maternal Grandmother     Past Surgical History:  Procedure Laterality Date   ANTERIOR CERVICAL DECOMP/DISCECTOMY FUSION N/A 01/16/2020   Procedure: c5-6, c6-7 ANTERIOR CERVICAL DECOMPRESSION/DISCECTOMY FUSION, ALLOGRAFT AND PLATE;  Surgeon: Marybelle Killings, MD;  Location: [facility];  Service: Orthopedics;  Laterality: N/A;   APPENDECTOMY  2002   lap appy.  Dr. Hassell Done   BREAST BIOPSY     BREAST LUMPECTOMY Left 2015   radiation   BREAST LUMPECTOMY WITH NEEDLE LOCALIZATION AND AXILLARY SENTINEL LYMPH NODE BX Left 08/04/2013   Procedure: BREAST LUMPECTOMY WITH NEEDLE LOCALIZATION AND AXILLARY SENTINEL LYMPH NODE Biopsy x 3;  Surgeon: Rolm Bookbinder, MD;  Location: [facility];  Service: General;  Laterality: Left;   BREAST SURGERY     CARPAL TUNNEL RELEASE  10/2009-12/2010   left - right   CESAREAN SECTION  X 2   COLONOSCOPY     DIAGNOSTIC LAPAROSCOPY     Lap. chole., appendectomy, hernia repair   HERNIA REPAIR  09/15/11   Ventral w/mesh   JOINT REPLACEMENT     KNEE ARTHROPLASTY Right 12/22/2015   Procedure: COMPUTER ASSISTED TOTAL KNEE ARTHROPLASTY;  Surgeon:  Marybelle Killings, MD;  Location: [facility];  Service: Orthopedics;  Laterality: Right;   LAPAROSCOPIC CHOLECYSTECTOMY  2006   Dr. Gean Maidens TUNNEL RELEASE Bilateral    THYROIDECTOMY, PARTIAL     VENTRAL HERNIA REPAIR  09/29/2011   Procedure: LAPAROSCOPIC VENTRAL HERNIA;  Surgeon: Adin Hector, MD;  [facility];  Service: General;  Laterality: N/A;  Laparoscopic Ventral Wall Hernia Repair with Mesh   Social History   Occupational History    Employer: [employer]  Tobacco Use   Smoking status: Never   Smokeless tobacco: Never  Vaping Use  Vaping Use: Never used  Substance and Sexual Activity   Alcohol use: No    Alcohol/week: 1.0 standard drink of alcohol    Types: 1 Glasses of wine per week   Drug use: No   Sexual activity: Yes    Comment: Menarche age 39, first live birth age 39,  [place] P2. She stopped having periods approximately 1999 and took HRT until 2005.

## 2022-04-20 ENCOUNTER — Emergency Department (HOSPITAL_BASED_OUTPATIENT_CLINIC_OR_DEPARTMENT_OTHER): Payer: Medicare PPO | Admitting: Radiology

## 2022-04-20 ENCOUNTER — Emergency Department (HOSPITAL_BASED_OUTPATIENT_CLINIC_OR_DEPARTMENT_OTHER)
Admission: EM | Admit: 2022-04-20 | Discharge: 2022-04-20 | Disposition: A | Discharge status: Home / Self Care | Payer: Medicare PPO | Attending: Emergency Medicine | Admitting: Emergency Medicine

## 2022-04-20 ENCOUNTER — Encounter (HOSPITAL_BASED_OUTPATIENT_CLINIC_OR_DEPARTMENT_OTHER): Payer: Self-pay | Admitting: Emergency Medicine

## 2022-04-20 ENCOUNTER — Emergency Department (HOSPITAL_BASED_OUTPATIENT_CLINIC_OR_DEPARTMENT_OTHER): Payer: Medicare PPO

## 2022-04-20 ENCOUNTER — Other Ambulatory Visit: Payer: Self-pay

## 2022-04-20 DIAGNOSIS — R059 Cough, unspecified: Secondary | ICD-10-CM | POA: Diagnosis not present

## 2022-04-20 DIAGNOSIS — Z79899 Other long term (current) drug therapy: Secondary | ICD-10-CM | POA: Diagnosis not present

## 2022-04-20 DIAGNOSIS — Z853 Personal history of malignant neoplasm of breast: Secondary | ICD-10-CM | POA: Diagnosis not present

## 2022-04-20 DIAGNOSIS — Z7901 Long term (current) use of anticoagulants: Secondary | ICD-10-CM | POA: Diagnosis not present

## 2022-04-20 DIAGNOSIS — R0602 Shortness of breath: Secondary | ICD-10-CM

## 2022-04-20 LAB — D-DIMER, QUANTITATIVE: D-Dimer, Quant: 0.32 ug/mL-FEU (ref 0.00–0.50)

## 2022-04-20 LAB — BASIC METABOLIC PANEL
Anion gap: 8 (ref 5–15)
BUN: 22 mg/dL (ref 8–23)
CO2: 28 mmol/L (ref 22–32)
Calcium: 8.9 mg/dL (ref 8.9–10.3)
Chloride: 99 mmol/L (ref 98–111)
Creatinine, Ser: 0.78 mg/dL (ref 0.44–1.00)
GFR, Estimated: >60 mL/min (ref >60)
Glucose, Bld: 98 mg/dL (ref 70–99)
Potassium: 3.9 mmol/L (ref 3.5–5.1)
Sodium: 135 mmol/L (ref 135–145)

## 2022-04-20 LAB — CBC
HCT: 40.9 % (ref 36.0–46.0)
Hemoglobin: 12.9 g/dL (ref 12.0–15.0)
MCH: 27.2 pg (ref 26.0–34.0)
MCHC: 31.5 g/dL (ref 30.0–36.0)
MCV: 86.3 fL (ref 80.0–100.0)
Platelets: 281 10*3/uL (ref 150–400)
RBC: 4.74 MIL/uL (ref 3.87–5.11)
RDW: 13.4 % (ref 11.5–15.5)
WBC: 6.7 10*3/uL (ref 4.0–10.5)
nRBC: 0 % (ref 0.0–0.2)

## 2022-04-20 LAB — BRAIN NATRIURETIC PEPTIDE: B Natriuretic Peptide: 90.4 pg/mL (ref 0.0–100.0)

## 2022-04-20 MED ORDER — IOHEXOL 350 MG/ML SOLN
100.0000 mL | Freq: Once | INTRAVENOUS | Status: AC | PRN
  Administered 2022-04-20: 75 mL via INTRAVENOUS

## 2022-04-20 MED ORDER — ALBUTEROL SULFATE HFA 108 (90 BASE) MCG/ACT IN AERS
INHALATION_SPRAY | RESPIRATORY_TRACT | Status: AC
  Administered 2022-04-20: 2 via RESPIRATORY_TRACT
  Filled 2022-04-20: qty 6.7

## 2022-04-20 MED ORDER — AEROCHAMBER PLUS FLO-VU MISC
1.0000 | Freq: Once | Status: AC
  Administered 2022-04-20: 1
  Filled 2022-04-20: qty 1

## 2022-04-20 MED ORDER — IPRATROPIUM-ALBUTEROL 0.5-2.5 (3) MG/3ML IN SOLN
3.0000 mL | Freq: Once | RESPIRATORY_TRACT | Status: AC
  Administered 2022-04-20: 3 mL via RESPIRATORY_TRACT
  Filled 2022-04-20: qty 3

## 2022-04-20 MED ORDER — ALBUTEROL SULFATE HFA 108 (90 BASE) MCG/ACT IN AERS: 2.0000 | INHALATION_SPRAY | RESPIRATORY_TRACT | Status: DC | PRN

## 2022-04-20 NOTE — ED Notes (Signed)
RT educated pt on proper use of MDI w/spacer as well as proper administration of her maintenance medication Flovent. Pt states she only takes when she needs it. Pt prescribed 2 puffs BID. RT educated pt on the importance of maintaining medication in her system in order for it to be effective for her asthma. RT recommended pt seek an appt with her pulmonologist for current testing and medication evaluation. Pt verbalizes understanding of teaching.

## 2022-04-20 NOTE — ED Notes (Signed)
Discharge paperwork given and verbally understood. 

## 2022-04-20 NOTE — Discharge Instructions (Signed)
You are seen in the emergency department for increased shortness of breath.  You had blood work EKG chest x-ray and a CAT scan of your chest that did not show an obvious explanation for your symptoms.  Please continue your regular medications and follow-up with your primary care doctor and pulmonologist.  Return to the emergency department if any worsening or concerning symptoms

## 2022-04-20 NOTE — ED Triage Notes (Signed)
Pt reports cough for week, shot of prednisone with no relief. Denies chest pain or fevers. Endorses SOB and palpitations with hx of Afib. Negative CXR yesterday.

## 2022-04-20 NOTE — ED Provider Notes (Signed)
[facility] EMERGENCY DEPT Provider Note   CSN: [id number] Arrival date & time: 04/20/22  1448     History  Chief Complaint  Patient presents with   Shortness of Breath   HPI Alyssa [name] is a 71 y.o. female with history of pulmonary embolism 2010, afib, breast cancer, and GERD presenting for shortness of breath and coughing.  Patient stated that her symptoms started about 3 weeks ago.  Denies fever. Has had some congestion. Was seen couple days ago for this complaint by PCP. Concern for pneumonia.  Per patient chest x-ray was negative.  Patient states that coughing is just been persistent prompting her to be reevaluated today in the emergency department. Patient also mentioned that she was recently diagnosed with A-fib 2 months ago.  Now on Eliquis and metoprolol.   Shortness of Breath      Home Medications Prior to Admission medications   Medication Sig Start Date End Date Taking? Authorizing Provider  albuterol (VENTOLIN HFA) 108 (90 Base) MCG/ACT inhaler Inhale 2 puffs into the lungs every 6 (six) hours as needed for wheezing or shortness of breath.  12/17/19   [provider]  amLODipine (NORVASC) 5 MG tablet Take 1 tablet (5 mg total) by mouth in the morning AND 0.5 tablets (2.5 mg total) every evening. 02/24/22   Deberah Pelton, NP  amphetamine-dextroamphetamine (ADDERALL) 20 MG tablet Take 20 mg by mouth See admin instructions. Take 37m in the morning and 221min the afternoon.    [provider]  apixaban (ELIQUIS) 5 MG TABS tablet Take 1 tablet (5 mg total) by mouth 2 (two) times daily. 01/04/22   Hilty, KeNadean CorwinMD  cetirizine (ZYRTEC) 10 MG tablet Take by mouth.    [provider]  levothyroxine (SYNTHROID) 125 MCG tablet Take 125 mcg by mouth daily before breakfast. 01/16/22   [provider]  levothyroxine (SYNTHROID) 137 MCG tablet 1 tablet in the morning on an empty stomach 11/09/21   [provider]   metoprolol succinate (TOPROL-XL) 25 MG 24 hr tablet Take 0.5 tablets (12.5 mg total) by mouth daily. MAY TAKE EXTRA 1/2 TAB FOR SUSTAINED HR >100 FOR 45 MIN 02/24/22   ClDeberah PeltonNP  omeprazole (PRILOSEC) 40 MG capsule TAKE 1 CAPSULE BY MOUTH DAILY 03/20/19   RaBrand MalesMD  oxybutynin (DITROPAN-XL) 10 MG 24 hr tablet Take 10 mg by mouth daily.  05/14/18   [provider]  potassium chloride (KLOR-CON) 10 MEQ tablet TAKE 1 TABLET(10 MEQ) BY MOUTH DAILY 06/28/21   Hilty, KeNadean CorwinMD  REPATHA SURECLICK 14409G/ML SOAJ INJECT 1 DOSE INTO THE SKIN EVERY 14 DAYS 08/22/21   Hilty, KeNadean CorwinMD  Semaglutide (OZEMPIC, 0.25 OR 0.5 MG/DOSE, [place]) Inject 1 mg into the skin once a week.    [provider]  traZODone (DESYREL) 50 MG tablet Take 50 mg by mouth at bedtime. 02/12/22   [provider]      Allergies    Aleve [naproxen sodium], Aromasin [exemestane], Letrozole, Levofloxacin, Penicillins, Symbicort [budesonide-formoterol fumarate], Codeine, Percocet [oxycodone-acetaminophen], Vicodin [hydrocodone-acetaminophen], Doxycycline, Ezetimibe, Prednisone, Cefdinir, and Losartan    Review of Systems   Review of Systems  Respiratory:  Positive for shortness of breath.     Physical Exam Updated Vital Signs BP (!) 134/108   Pulse 76   Temp 98.3 F (36.8 C) (Oral)   Resp (!) 22   Ht 5' 2"  (1.575 m)   Wt 90.3 kg   SpO2  96%   BMI 36.40 kg/m  Physical Exam Vitals and nursing note reviewed.  HENT:     Head: Normocephalic and atraumatic.     Mouth/Throat:     Mouth: Mucous membranes are moist.  Eyes:     General:        Right eye: No discharge.        Left eye: No discharge.     Conjunctiva/sclera: Conjunctivae normal.  Cardiovascular:     Rate and Rhythm: Normal rate and regular rhythm.     Pulses: Normal pulses.     Heart sounds: Normal heart sounds.  Pulmonary:     Effort: Pulmonary effort is normal.     Breath sounds: Normal breath sounds. No  decreased breath sounds, wheezing, rhonchi or rales.  Abdominal:     General: Abdomen is flat.     Palpations: Abdomen is soft.  Skin:    General: Skin is warm and dry.  Neurological:     General: No focal deficit present.  Psychiatric:        Mood and Affect: Mood normal.     ED Results / Procedures / Treatments   Labs (all labs ordered are listed, but only abnormal results are displayed) Labs Reviewed  CBC  BASIC METABOLIC PANEL  D-DIMER, QUANTITATIVE  BRAIN NATRIURETIC PEPTIDE    EKG EKG Interpretation  Date/Time:  Thursday April 20 2022 15:14:37 EDT Ventricular Rate:  59 PR Interval:  130 QRS Duration: 86 QT Interval:  410 QTC Calculation: 405 R Axis:   57 Text Interpretation: Sinus bradycardia Otherwise normal ECG When compared with ECG of 14-Jan-2020 08:46, No significant change was found Confirmed by Aletta Edouard [phone]) on 04/20/2022 3:26:18 PM  Radiology DG Chest 2 View  Result Date: 04/20/2022 CLINICAL DATA:  Shortness of breath EXAM: CHEST - 2 VIEW COMPARISON:  Chest x-ray 12/10/2015 FINDINGS: The heart size and mediastinal contours are within normal limits. Both lungs are clear. No acute fractures. Cervical spinal fusion plate is present. Cholecystectomy clips are present. Surgical clips are seen in the anterior left chest wall. IMPRESSION: No active cardiopulmonary disease. Electronically Signed   By: Ronney Asters M.D.   On: 04/20/2022 19:12    Procedures Procedures    Medications Ordered in ED Medications  albuterol (VENTOLIN HFA) 108 (90 Base) MCG/ACT inhaler 2 puff (2 puffs Inhalation Given 04/20/22 2042)  ipratropium-albuterol (DUONEB) 0.5-2.5 (3) MG/3ML nebulizer solution 3 mL (3 mLs Nebulization Given 04/20/22 2041)  aerochamber plus with mask device 1 each (1 each Other Given 04/20/22 2041)  iohexol (OMNIPAQUE) 350 MG/ML injection 100 mL (75 mLs Intravenous Contrast Given 04/20/22 2146)    ED Course/ Medical Decision Making/ A&P                            Medical Decision Making Amount and/or Complexity of Data Reviewed Labs: ordered. Radiology: ordered.  Risk Prescription drug management.   This patient presents to the ED for concern of shortness of breath, this involves a number of treatment options, and is a complaint that carries with it a high risk of complications and morbidity.  The differential diagnosis includes PE, ACS, pneumonia, and CHF.     Co morbidities: Discussed in HPI   EMR reviewed including pt PMHx, past surgical history and past visits to ER.   See HPI for more details   Lab Tests:   I personally reviewed all laboratory work and imaging. Metabolic panel without any acute  abnormality specifically kidney function within normal limits and no significant electrolyte abnormalities. CBC without leukocytosis or significant anemia.   Imaging Studies:  CT angio chest to rule out PE has been ordered.  I have signed out patient to attending ED physician who will continue to follow her.    Cardiac Monitoring:  The patient was maintained on a cardiac monitor.  I personally viewed and interpreted the cardiac monitored which showed an underlying rhythm of: NSR EKG non-ischemic   Medicines ordered:  I ordered medication including DuoNeb for shortness of breath. Reevaluation of the patient after these medicines showed that the patient stayed the same.  Patient stated that after the DuoNeb she really did not feel any different.  Continues to endorse cough and shortness of breath.   I have reviewed the patients home medicines and have made adjustments as needed   Consults/Attending Physician    Signed out patient to attending ED physician.   Reevaluation:  After the interventions noted above I re-evaluated patient and found that they have :stayed the same    Problem List / ED Course: Patient presented for shortness of breath.  This has been ongoing for 3 weeks.  On exam her lungs sounded  clear with no adventitious sounds.  She appears comfortable and in no respiratory distress.  However given relatively normal chest x-ray and ongoing shortness of breath did have a low suspicion for for PE.  D-dimer was negative.  However, had patient walk down the hall with a pulse ox.  She walked about 20 feet and was hypoxic in the high 80s, tachypneic and endorsing respiratory distress.  Even though D-dimer was negative and patient is currently on Eliquis, felt that CT angio to rule out PE was still warranted given that there is no explanation for ongoing shortness of breath. Did consider CHF but unlikely given normal cardiac silhouette clear lungs and normal BNP.  Signed out patient to ED physician.  Dispostion:  Disposition undetermined at this time.         Final Clinical Impression(s) / ED Diagnoses Final diagnoses:  Shortness of breath    Rx / DC Orders ED Discharge Orders     None         Harriet Pho, PA-C 04/20/22 2211    Hayden Rasmussen, MD 04/21/22 1108

## 2022-04-24 ENCOUNTER — Telehealth: Payer: Self-pay | Admitting: Internal Medicine

## 2022-04-24 NOTE — Telephone Encounter (Signed)
  Per MyChart scheduling message:  Patient c/o Palpitations:  High priority if patient c/o lightheadedness, shortness of breath, or chest pain  How long have you had palpitations/irregular HR/ Afib? Are you having the symptoms now?   Are you currently experiencing lightheadedness, SOB or CP?   Do you have a history of afib (atrial fibrillation) or irregular heart rhythm?   Have you checked your BP or HR? (document readings if available):   Are you experiencing any other symptoms?     I saw Dr Debara Pickett for Afib a couple of months ago, I also had a follow up visit with Denyse Amass I have the documentation keeping track of BP/HR [place] has results from Hospital visit 9/28 122/76,133/83,136/83,136/90,147/75,137/86, 167/90 Shortness of breath,fatigue,dizziness, cold sweats, swelling in ankles*new 9/30 Some headaches  High risk for stroke, heart attack I have the symptoms everyday and cannot do any daily tasks due to shortness of breath and fatigue dizziness

## 2022-04-24 NOTE — Telephone Encounter (Signed)
Pt is returning call.  

## 2022-04-24 NOTE — Telephone Encounter (Signed)
Attempted to contact patient, LVM to call back.

## 2022-04-24 NOTE — Telephone Encounter (Signed)
Called patient, she is concerned with the increased symptoms she is having. Patient seen PCP- they recommended she go to ED to have CT of her chest completed- notes in epic, this was completed, negative. Chest xray negative. Patient is not sure where all the SOB is coming from. No significant swelling (did notice some no Saturday), denies chest pain, does have palpitations, and is short of breath while talking on the phone. Patient aware to keep appointment for 8:00 AM with NP, and with pulmonary tomorrow as well- patient will do this. I advised patient if symptoms worsen or concerns worsen to go back to ED, patient verbalized understanding. Will route to NP to advise.   Thank you!

## 2022-04-24 NOTE — Progress Notes (Signed)
Cardiology Clinic Note   Patient Name: Alyssa [name] Date of Encounter: 04/25/2022  Primary Care Provider:  Nickola Major, MD Primary Cardiologist:  Pixie Casino, MD  Patient Profile    Alyssa [name] 71 year old female presents the clinic today for follow-up evaluation of her shortness of breath.  Past Medical History    Past Medical History:  Diagnosis Date   Abdominal pain    Anxiety    Arthritis    Asthma    Breast cancer ([place]) 06/2013   left upper inner   Cancer ([place])    breast   Diabetes mellitus without complication ([place])    Takes Tradjenta   Family history of adverse reaction to anesthesia    Patients mother had to be resusciated after having anesthesia   GERD (gastroesophageal reflux disease)    Headache(784.0)    Takes Depakote   Hernia, incisional periumbilical, incarcerated 1/32/4401   Hot flashes    Hx of radiation therapy 09/09/13- 10/13/13   left breast 5000 cGy in 25 sessions, no boost   Hyperlipidemia    Hypertension    PCP DR Nolon Rod   Hypothyroidism    Non-alcoholic fatty liver disease    Peripheral vascular disease ([place])    pulmonary embolis-still have some   Post concussion syndrome    4 yrs ago- sees [facility]- Dr Leonie Man every 6 months-   has sleep study 2011 following accident but was neg per patient-  short term memory issues, dates and times   Pulmonary embolism ([place]) 2013   Recurrent upper respiratory infection (URI)    FLU 08/23/11-08/29/11 then URI 08/29/11- 09/08/11- S/P zpack and steroids-  improved      no cough or congestion   Thyroid disease    Thyroid mass, left inferior lobe, 3.6cm [id number] 10/07/2011   Ventral hernia    Past Surgical History:  Procedure Laterality Date   ANTERIOR CERVICAL DECOMP/DISCECTOMY FUSION N/A 01/16/2020   Procedure: c5-6, c6-7 ANTERIOR CERVICAL DECOMPRESSION/DISCECTOMY FUSION, ALLOGRAFT AND PLATE;  Surgeon: Marybelle Killings, MD;  Location: [facility];  Service: Orthopedics;  Laterality: N/A;    APPENDECTOMY  2002   lap appy.  Dr. Hassell Done   BREAST BIOPSY     BREAST LUMPECTOMY Left 2015   radiation   BREAST LUMPECTOMY WITH NEEDLE LOCALIZATION AND AXILLARY SENTINEL LYMPH NODE BX Left 08/04/2013   Procedure: BREAST LUMPECTOMY WITH NEEDLE LOCALIZATION AND AXILLARY SENTINEL LYMPH NODE Biopsy x 3;  Surgeon: Rolm Bookbinder, MD;  Location: [facility];  Service: General;  Laterality: Left;   BREAST SURGERY     CARPAL TUNNEL RELEASE  10/2009-12/2010   left - right   CESAREAN SECTION  X 2   COLONOSCOPY     DIAGNOSTIC LAPAROSCOPY     Lap. chole., appendectomy, hernia repair   HERNIA REPAIR  09/15/11   Ventral w/mesh   JOINT REPLACEMENT     KNEE ARTHROPLASTY Right 12/22/2015   Procedure: COMPUTER ASSISTED TOTAL KNEE ARTHROPLASTY;  Surgeon: Marybelle Killings, MD;  Location: [facility];  Service: Orthopedics;  Laterality: Right;   LAPAROSCOPIC CHOLECYSTECTOMY  2006   Dr. Gean Maidens TUNNEL RELEASE Bilateral    THYROIDECTOMY, PARTIAL     VENTRAL HERNIA REPAIR  09/29/2011   Procedure: LAPAROSCOPIC VENTRAL HERNIA;  Surgeon: Adin Hector, MD;  Location: [facility];  Service: General;  Laterality: N/A;  Laparoscopic Ventral Wall Hernia Repair with Mesh    Allergies  Allergies  Allergen Reactions   Aleve [Naproxen Sodium] Hives,  Itching and Swelling   Aromasin [Exemestane] Anaphylaxis, Itching and Swelling    Pt currently tolerating medication well (02/03/16)-gwd; Pt stated "I can take this medication for a few months at a time and I take a benadryl when I feel it starting to come on" Patient has facial swelling, itching, and throat swelling  07/12/17  Taking this right now and no problem. sy/rn   Letrozole Shortness Of Breath, Rash and Other (See Comments)    Swelling - feet, eyes,hands;   Speech garbled   Levofloxacin Rash and Swelling    Facial swelling and red facial rash   Penicillins Hives, Swelling and Rash    All over the body. Has patient had a PCN reaction causing immediate rash,  facial/tongue/throat swelling, SOB or lightheadedness with hypotension: Yes Has patient had a PCN reaction causing severe rash involving mucus membranes or skin necrosis: No Has patient had a PCN reaction that required hospitalization No Has patient had a PCN reaction occurring within the last 10 years: No If all of the above answers are "NO", then may proceed with Cephalosporin use.    Symbicort [Budesonide-Formoterol Fumarate] Anaphylaxis, Hives, Swelling and Rash   Codeine Other (See Comments)    hallucinations   Percocet [Oxycodone-Acetaminophen] Other (See Comments)    Causes syncope day after medication has been taken.   Vicodin [Hydrocodone-Acetaminophen] Other (See Comments)    Causes syncope day after medication has been taken.   Doxycycline Swelling    Must with  antihistamine   Ezetimibe Other (See Comments)    Myalgias,fatigue   Prednisone Other (See Comments)    Can take with antihistamine/ can take the shot without antihistamine   Cefdinir Rash and Swelling   Losartan Rash    History of Present Illness    Alyssa [name] has a PMH of pulmonary embolism in 2010, breast cancer, atrial fibrillation, hypertension, and GERD.  She was seen by Dr. Debara Pickett.  She was referred to the lipid clinic by her PCP.  She reported that her cholesterol has been elevated since she was in her 26s.  Both of her brothers had elevated cholesterol over 400.  Her mother also had elevated cholesterol and her father died from heart disease at age 33.  She reported statin intolerance and had been on several cholesterol-lowering medications including Crestor, Lipitor, lovastatin, and ezetimibe.  Her cholesterol was noted to be 343, triglycerides 154, HDL 40, and LDL 272.  She has lost around 40 pounds while following a ketogenic diet.  She reported that she has been taking Crestor but noted significant pain with that and finally discontinued the medication.  In 2013 she had a pulmonary embolus.  Her CT was  reviewed and did show multivessel coronary disease.   She was seen in follow-up on 01/04/2022 by Dr. Debara Pickett.  She was responding well to [place].  Her LDL cholesterol was 41.  She reported that her blood pressure continued to be uncontrolled.  She had been started on amlodipine 5 mg daily.  Her blood pressures at home ranged in the 140-1 160/80 range.  She reported rash with ARB type medications.  Her heart rate was noted to be 57.  She indicated that she was noticing intermittent periods of palpitations with no clear cause.  She was wearing an Apple Watch and had done EKGs.  She reported that interpretation noted suggestions for atrial fibrillation.  They were reviewed and indicated that she was having multiple alerts indicating atrial fibrillation.  Her CHA2DS2-VASc score was 3  for age, gender, and hypertension.  She was started on metoprolol 12.5 mg daily, amlodipine, she denied OSA.  She was started on apixaban 5 mg daily and follow-up was planned for 6 months.   She presented to clinic 02/24/22 for follow-up evaluation stated she had noticed frequent episodes of accelerated heart rate.  She hds been tracking the heart rates with her Apple Watch and iPhone.  She also had noticed that her average blood pressure had been 253-664 range systolic.  Initially in the office  her blood pressure was 142/90.  On recheck her blood pressure was 138/88.  We reviewed her medication regimen and she expressed understanding.  We reviewed her CVA risk.  I asked her take an extra dose of metoprolol for sustained elevated heart rate that lasts more than 45 minutes.  I  also increased her amlodipine to 5 mg in the morning and at 2.5 mg in the evening.  She reported compliance with apixaban and denies bleeding issues.  I planned follow-up as scheduled with Dr. Debara Pickett.  I  asked her to continue her physical activity and avoid triggers for atrial fibrillation.  She expressed understanding.   She presented to the emergency department  on 04/20/2022 with shortness of breath and a cough.  She reported that she had noted her symptoms for approximately 3 weeks.  She denied fever.  She did note some congestion.  She had been seen a couple of days prior by her PCP who had some concern for pneumonia.  Her chest x-ray was negative.  She reported that her coughing has been persistent and prompted her to seek further evaluation.  She indicated that she had been recently diagnosed with atrial fibrillation and was compliant with her apixaban and metoprolol.  Her blood pressure was 134/108 and her pulse was 76.  Her EKG showed sinus bradycardia with a rate of 59 bpm.  She underwent chest CT to rule out PE.  Her CT showed no pulmonary embolus and clear lung fields.  Her D-dimer was negative.  She presents to the clinic today for follow-up evaluation states she has had a cough for about 4 weeks.  We reviewed her recent ED visit and CT scan.  She expressed understanding.  We also reviewed her most recent EKG.  She reports compliance with her anticoagulation.  She has taken about 4 extra doses of her metoprolol.  She reports just mild improvement with nebulizer treatments while she was in the emergency department and mild improvement in her symptoms with using her inhaler.  I will order an echocardiogram, continue her current medication regimen, and plan follow-up for 1 to 2 months.  Today she denies chest pain, increased shortness of breath, lower extremity edema, fatigue, palpitations, melena, hematuria, hemoptysis, diaphoresis, weakness, presyncope, syncope, orthopnea, and PND.   Home Medications    Prior to Admission medications   Medication Sig Start Date End Date Taking? Authorizing Provider  albuterol (VENTOLIN HFA) 108 (90 Base) MCG/ACT inhaler Inhale 2 puffs into the lungs every 6 (six) hours as needed for wheezing or shortness of breath.  12/17/19   [provider]  amLODipine (NORVASC) 5 MG tablet Take 1 tablet (5 mg total) by mouth  in the morning AND 0.5 tablets (2.5 mg total) every evening. 02/24/22   Deberah Pelton, NP  amphetamine-dextroamphetamine (ADDERALL) 20 MG tablet Take 20 mg by mouth See admin instructions. Take 73m in the morning and 224min the afternoon.    [provider]  apixaban (EArne [place]  5 MG TABS tablet Take 1 tablet (5 mg total) by mouth 2 (two) times daily. 01/04/22   Hilty, Nadean Corwin, MD  cetirizine (ZYRTEC) 10 MG tablet Take by mouth.    [provider]  levothyroxine (SYNTHROID) 125 MCG tablet Take 125 mcg by mouth daily before breakfast. 01/16/22   [provider]  levothyroxine (SYNTHROID) 137 MCG tablet 1 tablet in the morning on an empty stomach 11/09/21   [provider]  metoprolol succinate (TOPROL-XL) 25 MG 24 hr tablet Take 0.5 tablets (12.5 mg total) by mouth daily. MAY TAKE EXTRA 1/2 TAB FOR SUSTAINED HR >100 FOR 45 MIN 02/24/22   Deberah Pelton, NP  omeprazole (PRILOSEC) 40 MG capsule TAKE 1 CAPSULE BY MOUTH DAILY 03/20/19   Brand Males, MD  oxybutynin (DITROPAN-XL) 10 MG 24 hr tablet Take 10 mg by mouth daily.  05/14/18   [provider]  potassium chloride (KLOR-CON) 10 MEQ tablet TAKE 1 TABLET(10 MEQ) BY MOUTH DAILY 06/28/21   Hilty, Nadean Corwin, MD  REPATHA SURECLICK 505 MG/ML SOAJ INJECT 1 DOSE INTO THE SKIN EVERY 14 DAYS 08/22/21   Hilty, Nadean Corwin, MD  Semaglutide (OZEMPIC, 0.25 OR 0.5 MG/DOSE, [place]) Inject 1 mg into the skin once a week.    [provider]  traZODone (DESYREL) 50 MG tablet Take 50 mg by mouth at bedtime. 02/12/22   [provider]    Family History    Family History  Problem Relation Age of Onset   Malignant hyperthermia Mother    Heart attack Father    Stroke Father    Diabetes Other        Grandmother   Other Other        respiratory- Grandmother   Cancer Maternal Grandmother 73       breast   Breast cancer Maternal Grandmother    She indicated that her mother is alive. She indicated that her  father is deceased. She indicated that her brother is alive. She indicated that the status of her maternal grandmother is unknown. She indicated that the status of her other is unknown.  Social History    Social History   Socioeconomic History   Marital status: Divorced    Spouse name: Not on file   Number of children: 2   Years of education: Master's   Highest education level: Not on file  Occupational History    Employer: [employer]  Tobacco Use   Smoking status: Never   Smokeless tobacco: Never  Vaping Use   Vaping Use: Never used  Substance and Sexual Activity   Alcohol use: No    Alcohol/week: 1.0 standard drink of alcohol    Types: 1 Glasses of wine per week   Drug use: No   Sexual activity: Yes    Comment: Menarche age 51, first live birth age 51,  [place] P2. She stopped having periods approximately 1999 and took HRT until 2005.  Other Topics Concern   Not on file  Social History Narrative   Patient is divorced and lives alone.   Patient drinks 3-4 cups of caffeine daily.    Patient is right handed.   Social Determinants of Health   Financial Resource Strain: Not on file  Food Insecurity: Not on file  Transportation Needs: Not on file  Physical Activity: Not on file  Stress: Not on file  Social Connections: Not on file  Intimate Partner Violence: Not on file     Review of Systems  General:  No chills, fever, night sweats or weight changes.  Cardiovascular:  No chest pain, dyspnea on exertion, edema, orthopnea, palpitations, paroxysmal nocturnal dyspnea. Dermatological: No rash, lesions/masses Respiratory: No cough, dyspnea Urologic: No hematuria, dysuria Abdominal:   No nausea, vomiting, diarrhea, bright red blood per rectum, melena, or hematemesis Neurologic:  No visual changes, wkns, changes in mental status. All other systems reviewed and are otherwise negative except as noted above.  Physical Exam    VS:  BP 106/64   Pulse 82   Ht 5' 2"   (1.575 m)   Wt 199 lb 6.4 oz (90.4 kg)   SpO2 (!) 82%   BMI 36.47 kg/m  , BMI Body mass index is 36.47 kg/m. GEN: Well nourished, well developed, in no acute distress. HEENT: normal. Neck: Supple, no JVD, carotid bruits, or masses. Cardiac: RRR, no murmurs, rubs, or gallops. No clubbing, cyanosis, edema.  Radials/DP/PT 2+ and equal bilaterally.  Respiratory:  Respirations regular and unlabored, clear to auscultation bilaterally. GI: Soft, nontender, nondistended, BS + x 4. MS: no deformity or atrophy. Skin: warm and dry, no rash. Neuro:  Strength and sensation are intact. Psych: Normal affect.  Accessory Clinical Findings    Recent Labs: 04/20/2022: B Natriuretic Peptide 90.4; BUN 22; Creatinine, Ser 0.78; Hemoglobin 12.9; Platelets 281; Potassium 3.9; Sodium 135   Recent Lipid Panel    Component Value Date/Time   CHOL 114 12/22/2021 1015   TRIG 104 12/22/2021 1015   HDL 54 12/22/2021 1015   CHOLHDL 2.1 12/22/2021 1015   LDLCALC 41 12/22/2021 1015         ECG personally reviewed by me today-none today.  EKG 01/04/2022 Sinus bradycardia 57 bpm  EKG 04/21/2022 Sinus bradycardia 59 bpm   Assessment & Plan   1.  Shortness of breath/DOE-presented to the emergency department on 04/20/22 with complaints of cough and shortness of breath.  Chest CT was negative for PE and lung fields were clear. Increase physical activity as tolerated Order echocardiogram Follow-up with pulmonology as scheduled  Essential hypertension-BP today 106/64.  Continue amlodipine, metoprolol Continue amlodipine to 5 mg in a.m. and 2.5 mg in p.m. Heart healthy low-sodium diet-salty 6 given Increase physical activity as tolerated   Paroxysmal atrial fibrillation-remains cardiac unaware.  Continues to note intermittent episodes of atrial fibrillation with Apple Watch.  Reports compliance with apixaban and denies bleeding issues. Continue apixaban, metoprolol May take an extra dose of 12.5 mg  metoprolol for sustained elevated heart rate over 100. Heart healthy low-sodium diet-salty 6 given Increase physical activity as tolerated   Familial hyperlipidemia-12/22/2021: Cholesterol, Total 114; HDL 54; LDL Chol Calc (NIH) 41; Triglycerides 104.  Statin intolerance. Continue Repatha Heart healthy low-sodium high-fiber diet Increase physical activity as tolerated Continue weight loss   Coronary artery disease-denies chest pain today.  Noted to have multivessel coronary disease on chest CT. Continue metoprolol, Repatha Heart healthy low-sodium diet Increase physical activity as tolerated    Disposition: Follow-up with Dr. Debara Pickett in 3-4 months.    Jossie Ng. Deon Ivey NP-C     04/25/2022, 8:28 AM [address] Office [phone] Fax [phone]  Notice: This dictation was prepared with Dragon dictation along with smaller phrase technology. Any transcriptional errors that result from this process are unintentional and may not be corrected upon review.  I spent 14 minutes examining this patient, reviewing medications, and using patient centered shared decision making involving her cardiac care.  Prior to her visit I spent  greater than 20 minutes reviewing her past medical history,  medications, and prior cardiac tests.

## 2022-04-25 ENCOUNTER — Ambulatory Visit (HOSPITAL_BASED_OUTPATIENT_CLINIC_OR_DEPARTMENT_OTHER): Payer: Medicare PPO

## 2022-04-25 ENCOUNTER — Encounter: Payer: Self-pay | Admitting: General Practice

## 2022-04-25 ENCOUNTER — Ambulatory Visit: Payer: Medicare PPO | Admitting: Pulmonary Disease

## 2022-04-25 ENCOUNTER — Encounter: Payer: Self-pay | Admitting: Pulmonary Disease

## 2022-04-25 ENCOUNTER — Ambulatory Visit: Payer: Medicare PPO | Attending: General Practice | Admitting: General Practice

## 2022-04-25 VITALS — BP 106/64 | HR 82 | Ht 62.0 in | Wt 199.4 lb

## 2022-04-25 VITALS — BP 126/86 | HR 88 | Ht 62.0 in | Wt 200.0 lb

## 2022-04-25 DIAGNOSIS — R0602 Shortness of breath: Secondary | ICD-10-CM

## 2022-04-25 DIAGNOSIS — I2510 Atherosclerotic heart disease of native coronary artery without angina pectoris: Secondary | ICD-10-CM | POA: Diagnosis present

## 2022-04-25 DIAGNOSIS — I2584 Coronary atherosclerosis due to calcified coronary lesion: Secondary | ICD-10-CM | POA: Diagnosis present

## 2022-04-25 DIAGNOSIS — R0609 Other forms of dyspnea: Secondary | ICD-10-CM

## 2022-04-25 DIAGNOSIS — E7849 Other hyperlipidemia: Secondary | ICD-10-CM | POA: Insufficient documentation

## 2022-04-25 DIAGNOSIS — I10 Essential (primary) hypertension: Secondary | ICD-10-CM | POA: Insufficient documentation

## 2022-04-25 DIAGNOSIS — K219 Gastro-esophageal reflux disease without esophagitis: Secondary | ICD-10-CM | POA: Diagnosis not present

## 2022-04-25 DIAGNOSIS — I480 Paroxysmal atrial fibrillation: Secondary | ICD-10-CM | POA: Insufficient documentation

## 2022-04-25 LAB — ECHOCARDIOGRAM COMPLETE
AR max vel: 1.62 cm2
AV Area VTI: 1.71 cm2
AV Area mean vel: 1.69 cm2
AV Mean grad: 9 mmHg
AV Peak grad: 16.5 mmHg
Ao pk vel: 2.03 m/s
Area-P 1/2: 3.99 cm2
Height: 62 in
MV M vel: 4.61 m/s
MV Peak grad: 85 mmHg
S' Lateral: 2.6 cm
Weight: 3190.4 oz

## 2022-04-25 MED ORDER — FLUTICASONE PROPIONATE HFA 110 MCG/ACT IN AERO: 2.0000 | INHALATION_SPRAY | Freq: Every day | RESPIRATORY_TRACT | 5 refills | Status: DC

## 2022-04-25 MED ORDER — METHYLPREDNISOLONE 4 MG PO TBPK: ORAL_TABLET | ORAL | 0 refills | Status: DC

## 2022-04-25 NOTE — Progress Notes (Signed)
Subjective:    Patient ID: Alyssa [name], female    DOB: [birth year], 71 y.o.   MRN: [id number]  HPI  71 year old never smoker presents for evaluation of shortness of breath and wheezing  She has been seen in our office in the past for upper airway cough syndrome attributed to GERD, last office visit was 2019 for cough flare She follows with cardiology for hyperlipidemia and atrial fibrillation was started on apixaban 02/2022. She reports a flareup of her cough for about 4 weeks in spite of taking omeprazole daily.  She reports increased dyspnea on exertion and fatigue for 2 weeks.  She had fracture of her left fifth metatarsal and she has been wearing a boot for 10 weeks. She had an ED visit 9/28 which I reviewed, given albuterol nebulizer, given spacer to use with Proventil, D-dimer was negative, CT angiogram did not show any evidence of pulm embolism, EKG showed sinus rhythm. 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  Allergy -to prednisone PMH: Breast cancer 2014, DM, GERD, HA, HLD, HTN, Hypothyroidism, PAD -Submassive pulmonary embolism 2013, 10 days after hernia surgery, strong family history her dad died at age 78 of PE, took anticoagulation for 1 year -Postconcussion syndrome 2015  She was only able to ambulate 1 lap due to shortness of breath and heart rate increased from 85-97, saturation stayed at 97%  Significant tests/ events reviewed  CTA chest 04/20/22 neg CT angio chest 10/07/11 >> large PE  FeNO 04/30/17 >> 14   Past Medical History:  Diagnosis Date   Abdominal pain    Anxiety    Arthritis    Asthma    Breast cancer ([place]) 06/2013   left upper inner   Cancer ([place])    breast   Diabetes mellitus without complication  ([place])    Takes Tradjenta   Family history of adverse reaction to anesthesia    Patients mother had to be resusciated after having anesthesia   GERD (gastroesophageal reflux disease)    Headache(784.0)    Takes Depakote   Hernia, incisional periumbilical, incarcerated 8/93/8101   Hot flashes    Hx of radiation therapy 09/09/13- 10/13/13   left breast 5000 cGy in 25 sessions, no boost   Hyperlipidemia    Hypertension    PCP DR Nolon Rod   Hypothyroidism    Non-alcoholic fatty liver disease    Peripheral vascular disease ([place])    pulmonary embolis-still have some   Post concussion syndrome    4 yrs ago- sees [facility]- Dr Leonie Man every 6 months-   has sleep study 2011 following accident but was neg per patient-  short term memory issues, dates and times   Pulmonary embolism ([place]) 2013   Recurrent upper respiratory infection (URI)    FLU 08/23/11-08/29/11 then URI 08/29/11- 09/08/11- S/P zpack and steroids-  improved      no cough or congestion   Thyroid disease    Thyroid mass, left inferior lobe, 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  Dr. Hassell Done   BREAST BIOPSY     BREAST LUMPECTOMY Left 2015   radiation   BREAST LUMPECTOMY WITH NEEDLE LOCALIZATION AND AXILLARY SENTINEL LYMPH NODE BX Left 08/04/2013   Procedure: BREAST LUMPECTOMY WITH NEEDLE LOCALIZATION AND AXILLARY SENTINEL LYMPH NODE Biopsy x 3;  Surgeon: Rolm Bookbinder, MD;  Location: [facility];  Service: General;  Laterality: Left;   BREAST SURGERY     CARPAL TUNNEL RELEASE  10/2009-12/2010   left - right   CESAREAN SECTION  X 2   COLONOSCOPY     DIAGNOSTIC LAPAROSCOPY     Lap. chole., appendectomy,  hernia repair   HERNIA REPAIR  09/15/11   Ventral w/mesh   JOINT REPLACEMENT     KNEE ARTHROPLASTY Right 12/22/2015   Procedure: COMPUTER ASSISTED TOTAL KNEE ARTHROPLASTY;  Surgeon: Marybelle Killings, MD;  Location: [facility];  Service: Orthopedics;  Laterality: Right;   LAPAROSCOPIC CHOLECYSTECTOMY  2006   Dr. Gean Maidens TUNNEL RELEASE Bilateral    THYROIDECTOMY, PARTIAL     VENTRAL HERNIA REPAIR  09/29/2011   Procedure: LAPAROSCOPIC VENTRAL HERNIA;  Surgeon: Adin Hector, MD;  Location[facility];  Service: General;  Laterality: N/A;  Laparoscopic Ventral Wall Hernia Repair with Mesh    Allergies  Allergen Reactions   Aleve [Naproxen Sodium] Hives, Itching and Swelling   Aromasin [Exemestane] Anaphylaxis, Itching and Swelling    Pt currently tolerating medication well (02/03/16)-gwd; Pt stated "I can take this medication for a few months at a time and I take a benadryl when I feel it starting to come on" Patient has facial swelling, itching, and throat swelling  07/12/17  Taking this right now and no problem. sy/rn   Letrozole Shortness Of Breath, Rash and Other (See Comments)    Swelling - feet, eyes,hands;   Speech garbled   Levofloxacin Rash and Swelling    Facial swelling and red facial rash   Penicillins Hives, Swelling and Rash    All over the body. Has patient had a PCN reaction causing immediate rash, facial/tongue/throat swelling, SOB or lightheadedness with hypotension: Yes Has patient had a PCN reaction causing severe rash involving mucus membranes or skin necrosis: No Has patient had a PCN reaction that required hospitalization No Has patient had a PCN reaction occurring within the last 10 years: No If all of the above answers are "NO", then may proceed with Cephalosporin use.    Symbicort [Budesonide-Formoterol Fumarate] Anaphylaxis, Hives, Swelling and Rash   Codeine Other (See Comments)    hallucinations   Percocet [Oxycodone-Acetaminophen] Other (See Comments)    Causes  syncope day after medication has been taken.   Vicodin [Hydrocodone-Acetaminophen] Other (See Comments)    Causes syncope day after medication has been taken.   Doxycycline Swelling    Must with  antihistamine   Ezetimibe Other (See Comments)    Myalgias,fatigue   Prednisone Other (See Comments)    Can take with antihistamine/ can take the shot without antihistamine   Cefdinir Rash and Swelling   Losartan Rash    Social History   Socioeconomic History   Marital status: Divorced    Spouse name: Not on file   Number of children: 2   Years of education: Master's   Highest education level: Not on file  Occupational History    Employer: [employer]  Tobacco Use   Smoking status: Never   Smokeless tobacco: Never  Vaping Use  Vaping Use: Never used  Substance and Sexual Activity   Alcohol use: No    Alcohol/week: 1.0 standard drink of alcohol    Types: 1 Glasses of wine per week   Drug use: No   Sexual activity: Yes    Comment: Menarche age 88, first live birth age 6,  [place] P2. She stopped having periods approximately 1999 and took HRT until 2005.  Other Topics Concern   Not on file  Social History Narrative   Patient is divorced and lives alone.   Patient drinks 3-4 cups of caffeine daily.    Patient is right handed.   Social Determinants of Health   Financial Resource Strain: Not on file  Food Insecurity: Not on file  Transportation Needs: Not on file  Physical Activity: Not on file  Stress: Not on file  Social Connections: Not on file  Intimate Partner Violence: Not on file    Family History  Problem Relation Age of Onset   Malignant hyperthermia Mother    Heart attack Father    Stroke Father    Diabetes Other        Grandmother   Other Other        respiratory- Grandmother   Cancer Maternal Grandmother 85       breast   Breast cancer Maternal Grandmother      Review of Systems  Shortness of breath with activity Nonproductive  cough Irregular heartbeat Acid heartburn Indigestion Weight gain of 5 pounds Headaches and nasal congestion Itching   Constitutional: negative for anorexia, fevers and sweats  Eyes: negative for irritation, redness and visual disturbance  Ears, nose, mouth, throat, and face: negative for earaches, epistaxis, nasal congestion and sore throat  Respiratory: negative for sputum and wheezing  Cardiovascular: negative for chest pain,  lower extremity edema, orthopnea, palpitations and syncope  Gastrointestinal: negative for abdominal pain, constipation, diarrhea, melena, nausea and vomiting  Genitourinary:negative for dysuria, frequency and hematuria  Hematologic/lymphatic: negative for bleeding, easy bruising and lymphadenopathy  Musculoskeletal:negative for arthralgias, muscle weakness and stiff joints  Neurological: negative for coordination problems, gait problems, headaches and weakness  Endocrine: negative for diabetic symptoms including polydipsia, polyuria and weight loss     Objective:   Physical Exam    Gen. Pleasant, obese, in no distress, normal affect ENT - no pallor,icterus, no post nasal drip, class 2-3 airway Neck: No JVD, no thyromegaly, no carotid bruits Lungs: no use of accessory muscles, no dullness to percussion, decreased without rales or rhonchi  Cardiovascular: Rhythm regular, heart sounds  normal, no murmurs or gallops, no peripheral edema Abdomen: soft and non-tender, no hepatosplenomegaly, BS normal. Musculoskeletal: No deformities, no cyanosis or clubbing Neuro:  alert, non focal, no tremors        Assessment & Plan:

## 2022-04-25 NOTE — Patient Instructions (Signed)
Increase omeprazole to 1 tablet twice daily  X prescription for Flovent MDI -2 puffs once daily Use albuterol as needed for rescue with spacer  X Medrol Dosepak

## 2022-04-25 NOTE — Assessment & Plan Note (Signed)
She seems to have breakthrough reflux in spite of taking PPI once daily. I  asked her to increase omeprazole to twice daily

## 2022-04-25 NOTE — Telephone Encounter (Signed)
[place]

## 2022-04-25 NOTE — Patient Instructions (Signed)
Medication Instructions:  The current medical regimen is effective;  continue present plan and medications as directed. Please refer to the Current Medication list given to you today.  *If you need a refill on your cardiac medications before your next appointment, please call your pharmacy*   Lab Work: NONE  Testing/Procedures: Echocardiogram - Your physician has requested that you have an echocardiogram. Echocardiography is a painless test that uses sound waves to create images of your heart. It provides your doctor with information about the size and shape of your heart and how well your heart's chambers and valves are working. This procedure takes approximately one hour. There are no restrictions for this procedure.    Follow-Up: At [facility], you and your health needs are our priority.  As part of our continuing mission to provide you with exceptional heart care, we have created designated Provider Care Teams.  These Care Teams include your primary Cardiologist (physician) and Advanced Practice Providers (APPs -  Physician Assistants and Nurse Practitioners) who all work together to provide you with the care you need, when you need it.  Your next appointment:   1-2 month(s)  The format for your next appointment:   In Person  Provider:   Coletta Memos, FNP-C   Important Information About Sugar

## 2022-04-25 NOTE — Assessment & Plan Note (Signed)
Unclear whether this is cardiac etiology or pulmonary etiology and this never smoker.  Pulm embolism has been ruled out.  CT angiogram chest was reviewed this does not show any parenchymal abnormality. Echo results are pending -this would be useful to assess EF in pulmonary artery pressure. She does not desaturate after walking 1 lap which is reassuring but clearly her exercise tolerance is limited. We will treat her for reactive airways disease given history of wheezing noted on ED visit -we will give her a Medrol Dosepak, Flovent MDI prescription will be provided to take 1 puff twice daily She will continue to use her Proventil MDI for rescue

## 2022-04-28 ENCOUNTER — Telehealth: Payer: Self-pay | Admitting: Internal Medicine

## 2022-04-28 NOTE — Telephone Encounter (Signed)
Patient called to get results of Echo test.

## 2022-04-28 NOTE — Telephone Encounter (Signed)
Called patient, advised of ECHO results.   Patient verbalized understanding.   Thankful for call back.

## 2022-05-01 ENCOUNTER — Encounter: Payer: Self-pay | Admitting: Internal Medicine

## 2022-05-01 NOTE — Telephone Encounter (Signed)
Spoke with pt, she reports continued SOB with little exertion and continued cough. She reports in the hospital her sat dropped low but the walk test at the pulmonary office she did better. The pulmonologist gave her a dose -pack that she has just finished with no change in symptoms. She sleeps on 2 pillows so does not get orthopnea. She reports she is having more episodes of atrial fib and they are lasting longer. She wants to know what to do next as all of her testing has come back okay. She has seen the app several times and wants to know if dr hilty has any suggestions. Aware will forward for dr hilty to review.

## 2022-05-03 ENCOUNTER — Telehealth: Payer: Self-pay | Admitting: Internal Medicine

## 2022-05-03 NOTE — Telephone Encounter (Signed)
Echo on 10/3 does not suggest heart failure or valve cause of shortness of breath - could be having symptomatic afib. Would have to see her back in the office to evaluate - may consider monitor to evaluate afib burden.  ?need to suppress afib if this is contributing to her dyspnea.  Dr. Lemmie Evens

## 2022-05-03 NOTE — Telephone Encounter (Signed)
Patient stated she thinks she may be reacting to amlodipine with symptoms of wheezing, sob, fatigue, dizziness. She has been on amlodipine for a while. While patient was taking to me, she had a constant dry cough. I had her use her albuterol inhaler. Patient education on use. Her coughing lessened, but still had a dry cough. She completed Medrol pak on Monday. While on phone 143/89, 91. Recommended she contact her PCP regarding rash on face and pulmonologist for constant wheezing/cough. Explained that amlodipine may not be causing symptoms. Please advise.

## 2022-05-03 NOTE — Telephone Encounter (Signed)
Patient calling for update from her mychart message on yesterday. Patient also think she is allergic to amLODipine (NORVASC) 5 MG tablet. Please advise

## 2022-05-10 ENCOUNTER — Ambulatory Visit (INDEPENDENT_AMBULATORY_CARE_PROVIDER_SITE_OTHER): Payer: Medicare PPO

## 2022-05-10 ENCOUNTER — Ambulatory Visit: Payer: Medicare PPO | Admitting: Orthopaedic Surgery

## 2022-05-10 DIAGNOSIS — S92352D Displaced fracture of fifth metatarsal bone, left foot, subsequent encounter for fracture with routine healing: Secondary | ICD-10-CM | POA: Diagnosis not present

## 2022-05-10 NOTE — Progress Notes (Addendum)
Office Visit Note   Patient: Alyssa [name]           Date of Birth: Nov 25, 1950           MRN: [id number] Visit Date: 05/10/2022              Requested by: Nickola Major, MD [id number] [place],  [place] [address] PCP: Nickola Major, MD   Assessment & Plan: Visit Diagnoses:  1. Closed displaced fracture of fifth metatarsal bone of left foot with routine healing, subsequent encounter     Plan: Impression is nearly 4 months status post left foot fifth metatarsal fracture.  Clinically she is doing much better.  Radiographically, she is demonstrating persistent fracture lucency with an area without signs of progressive healing.  At this point, she will try to transition into a stiff shoe weightbearing as tolerated as she has not previously done this.  Would also like to go ahead and get approval for a bone stimulator.  She will follow-up with [place] in 6 weeks for repeat evaluation and three-view x-rays of the left foot.  Call with concerns or questions in meantime.  Follow-Up Instructions: Return in about 6 weeks (around 06/21/2022).   Orders:  Orders Placed This Encounter  Procedures   XR Foot Complete Left   No orders of the defined types were placed in this encounter.     Procedures: No procedures performed   Clinical Data: No additional findings.   Subjective: Chief Complaint  Patient presents with   Left Foot - Pain    HPI patient is a pleasant 71 year old female who comes in today almost 4 months status post left foot fifth metatarsal fracture, date of injury 01/28/2022.  She has been doing better in regards to pain, but is still ambulating in a short cam boot.  She is taking 5000 units of vitamin D daily.     Objective: Vital Signs: There were no vitals taken for this visit.    Ortho Exam left foot exam shows no swelling.  Moderate tenderness to the fracture site.  She is neurovascular intact distally.  Specialty Comments:  No specialty comments  available.  Imaging: Three view X-rays demonstrate persistent fracture lucency with an area of no signs of progression   PMFS History: Patient Active Problem List   Diagnosis Date Noted   Dyspnea on exertion 04/25/2022   Nondisplaced fracture of fifth metatarsal bone, right foot, initial encounter for closed fracture 01/30/2022   Pain in left shoulder 01/02/2022   Spondylosis of cervical region without myelopathy or radiculopathy 01/16/2020   Other spondylosis with radiculopathy, cervical region 06/13/2018   GERD (gastroesophageal reflux disease) 04/05/2018   Allergic rhinitis 04/05/2018   Upper airway cough syndrome 04/05/2018   Patellar tendinitis, right knee 03/01/2017   Status post total right knee replacement 12/22/2015   Hepatic steatosis 02/09/2015   Hot flashes 09/09/2014   Osteopenia 09/09/2014   Malignant neoplasm of upper-inner quadrant of left breast in female, estrogen receptor positive ([facility]) 07/23/2013   Memory loss 07/14/2013   Headache 07/14/2013   Other malaise and fatigue 07/14/2013   Acute pulmonary embolism ([facility]) 10/07/2011   Leukocytosis 10/07/2011   Thyroid mass, left inferior lobe, 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 Mar2013 10/07/2011   Hyponatremia 10/07/2011   Obesity (BMI 30-39.9) 08/07/2011   Post concussion syndrome    Past Medical History:  Diagnosis Date   Abdominal pain    Anxiety    Arthritis    Asthma    Breast  cancer ([facility]) 06/2013   left upper inner   Cancer ([place])    breast   Diabetes mellitus without complication ([place])    Takes Tradjenta   Family history of adverse reaction to anesthesia    Patients mother had to be resusciated after having anesthesia   GERD (gastroesophageal reflux disease)    Headache(784.0)    Takes Depakote   Hernia, incisional periumbilical, incarcerated 0/62/3762   Hot flashes    Hx of radiation therapy 09/09/13- 10/13/13   left breast 5000 cGy in 25 sessions, no boost   Hyperlipidemia    Hypertension    PCP DR Nolon Rod    Hypothyroidism    Non-alcoholic fatty liver disease    Peripheral vascular disease ([place])    pulmonary embolis-still have some   Post concussion syndrome    4 yrs ago- sees [facility]- Dr Leonie Man every 6 months-   has sleep study 2011 following accident but was neg per patient-  short term memory issues, dates and times   Pulmonary embolism ([place]) 2013   Recurrent upper respiratory infection (URI)    FLU 08/23/11-08/29/11 then URI 08/29/11- 09/08/11- S/P zpack and steroids-  improved      no cough or congestion   Thyroid disease    Thyroid mass, left inferior lobe, 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 [id number] 10/07/2011   Ventral hernia     Family History  Problem Relation Age of Onset   Malignant hyperthermia Mother    Heart attack Father    Stroke Father    Diabetes Other        Grandmother   Other Other        respiratory- Grandmother   Cancer Maternal Grandmother 85       breast   Breast cancer Maternal Grandmother     Past Surgical History:  Procedure Laterality Date   ANTERIOR CERVICAL DECOMP/DISCECTOMY FUSION N/A 01/16/2020   Procedure: c5-6, c6-7 ANTERIOR CERVICAL DECOMPRESSION/DISCECTOMY FUSION, ALLOGRAFT AND PLATE;  Surgeon: Marybelle Killings, MD;  Location: [facility];  Service: Orthopedics;  Laterality: N/A;   APPENDECTOMY  2002   lap appy.  Dr. Hassell Done   BREAST BIOPSY     BREAST LUMPECTOMY Left 2015   radiation   BREAST LUMPECTOMY WITH NEEDLE LOCALIZATION AND AXILLARY SENTINEL LYMPH NODE BX Left 08/04/2013   Procedure: BREAST LUMPECTOMY WITH NEEDLE LOCALIZATION AND AXILLARY SENTINEL LYMPH NODE Biopsy x 3;  Surgeon: Rolm Bookbinder, MD;  Location: [facility];  Service: General;  Laterality: Left;   BREAST SURGERY     CARPAL TUNNEL RELEASE  10/2009-12/2010   left - right   CESAREAN SECTION  X 2   COLONOSCOPY     DIAGNOSTIC LAPAROSCOPY     Lap. chole., appendectomy, hernia repair   HERNIA REPAIR  09/15/11   Ventral w/mesh   JOINT REPLACEMENT     KNEE ARTHROPLASTY Right 12/22/2015   Procedure: COMPUTER ASSISTED  TOTAL KNEE ARTHROPLASTY;  Surgeon: Marybelle Killings, MD;  Location: [facility];  Service: Orthopedics;  Laterality: Right;   LAPAROSCOPIC CHOLECYSTECTOMY  2006   Dr. Gean Maidens TUNNEL RELEASE Bilateral    THYROIDECTOMY, PARTIAL     VENTRAL HERNIA REPAIR  09/29/2011   Procedure: LAPAROSCOPIC VENTRAL HERNIA;  Surgeon: Adin Hector, MD;  [facility];  Service: General;  Laterality: N/A;  Laparoscopic Ventral Wall Hernia Repair with Mesh   Social History   Occupational History    Employer: [employer]  Tobacco Use   Smoking status: Never   Smokeless tobacco: Never  Vaping Use   Vaping Use: Never used  Substance and Sexual Activity   Alcohol use: No    Alcohol/week: 1.0 standard drink of alcohol    Types: 1 Glasses of wine per week   Drug use: No   Sexual activity: Yes    Comment: Menarche age 85, first live birth age 78,  [place] P2. She stopped having periods approximately 1999 and took HRT until 2005.

## 2022-05-11 NOTE — Telephone Encounter (Signed)
Left message on patient's personal cell phone that Dr. Debara Pickett stated her symptoms were not likely caused by amlodipine, and wanted to know if she was able to contact her PCP regarding the symptoms she reported last week.

## 2022-05-11 NOTE — Telephone Encounter (Signed)
Would be rare for amlodipine to cause those symptoms.  Dr Lemmie Evens

## 2022-05-19 ENCOUNTER — Ambulatory Visit: Payer: Medicare PPO | Admitting: Pulmonary Disease

## 2022-05-19 ENCOUNTER — Ambulatory Visit: Payer: Medicare PPO | Admitting: Nurse Practitioner

## 2022-06-02 ENCOUNTER — Ambulatory Visit: Payer: Medicare PPO | Admitting: Adult Health

## 2022-06-19 NOTE — Progress Notes (Deleted)
Cardiology Clinic Note   Patient Name: [name] Date of Encounter: 06/19/2022  Primary Care Provider:  Nickola Major, MD Primary Cardiologist:  Pixie Casino, MD  Patient Profile    Alyssa [name] 71 year old female presents the clinic today for follow-up evaluation of her shortness of breath.  Past Medical History    Past Medical History:  Diagnosis Date   Abdominal pain    Anxiety    Arthritis    Asthma    Breast cancer ([place]) 06/2013   left upper inner   Cancer ([place])    breast   Diabetes mellitus without complication ([place])    Takes Tradjenta   Family history of adverse reaction to anesthesia    Patients mother had to be resusciated after having anesthesia   GERD (gastroesophageal reflux disease)    Headache(784.0)    Takes Depakote   Hernia, incisional periumbilical, incarcerated 03/26/8332   Hot flashes    Hx of radiation therapy 09/09/13- 10/13/13   left breast 5000 cGy in 25 sessions, no boost   Hyperlipidemia    Hypertension    PCP DR Nolon Rod   Hypothyroidism    Non-alcoholic fatty liver disease    Peripheral vascular disease ([place])    pulmonary embolis-still have some   Post concussion syndrome    4 yrs ago- sees [facility]- Dr Leonie Man every 6 months-   has sleep study 2011 following accident but was neg per patient-  short term memory issues, dates and times   Pulmonary embolism ([place]) 2013   Recurrent upper respiratory infection (URI)    FLU 08/23/11-08/29/11 then URI 08/29/11- 09/08/11- S/P zpack and steroids-  improved      no cough or congestion   Thyroid disease    Thyroid mass, left inferior lobe, 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  Dr. Hassell Done   BREAST BIOPSY     BREAST LUMPECTOMY Left 2015   radiation   BREAST LUMPECTOMY WITH NEEDLE LOCALIZATION AND AXILLARY SENTINEL LYMPH NODE BX Left 08/04/2013   Procedure: BREAST LUMPECTOMY WITH NEEDLE LOCALIZATION AND AXILLARY SENTINEL LYMPH NODE Biopsy x 3;  Surgeon: Rolm Bookbinder, MD;  Location: [facility];  Service: General;  Laterality: Left;   BREAST SURGERY     CARPAL TUNNEL RELEASE  10/2009-12/2010   left - right   CESAREAN SECTION  X 2   COLONOSCOPY     DIAGNOSTIC LAPAROSCOPY     Lap. chole., appendectomy, hernia repair   HERNIA REPAIR  09/15/11   Ventral w/mesh   JOINT REPLACEMENT     KNEE ARTHROPLASTY Right 12/22/2015   Procedure: COMPUTER ASSISTED TOTAL KNEE ARTHROPLASTY;  Surgeon: Marybelle Killings, MD;  Location: [facility];  Service: Orthopedics;  Laterality: Right;   LAPAROSCOPIC CHOLECYSTECTOMY  2006   Dr. Gean Maidens TUNNEL RELEASE Bilateral    THYROIDECTOMY, PARTIAL     VENTRAL HERNIA REPAIR  09/29/2011   Procedure: LAPAROSCOPIC VENTRAL HERNIA;  Surgeon: Adin Hector, MD;  Location: [facility];  Service: General;  Laterality: N/A;  Laparoscopic Ventral Wall Hernia Repair with Mesh    Allergies  Allergies  Allergen Reactions   Aleve [Naproxen Sodium] Hives,  Itching and Swelling   Aromasin [Exemestane] Anaphylaxis, Itching and Swelling    Pt currently tolerating medication well (02/03/16)-gwd; Pt stated "I can take this medication for a few months at a time and I take a benadryl when I feel it starting to come on" Patient has facial swelling, itching, and throat swelling  07/12/17  Taking this right now and no problem. sy/rn   Letrozole Shortness Of Breath, Rash and Other (See Comments)    Swelling - feet, eyes,hands;   Speech garbled   Levofloxacin Rash and Swelling    Facial swelling and red facial rash   Penicillins Hives, Swelling and Rash    All over the body. Has patient had a PCN reaction causing immediate rash,  facial/tongue/throat swelling, SOB or lightheadedness with hypotension: Yes Has patient had a PCN reaction causing severe rash involving mucus membranes or skin necrosis: No Has patient had a PCN reaction that required hospitalization No Has patient had a PCN reaction occurring within the last 10 years: No If all of the above answers are "NO", then may proceed with Cephalosporin use.    Symbicort [Budesonide-Formoterol Fumarate] Anaphylaxis, Hives, Swelling and Rash   Codeine Other (See Comments)    hallucinations   Percocet [Oxycodone-Acetaminophen] Other (See Comments)    Causes syncope day after medication has been taken.   Vicodin [Hydrocodone-Acetaminophen] Other (See Comments)    Causes syncope day after medication has been taken.   Doxycycline Swelling    Must with  antihistamine   Ezetimibe Other (See Comments)    Myalgias,fatigue   Prednisone Other (See Comments)    Can take with antihistamine/ can take the shot without antihistamine   Cefdinir Rash and Swelling   Losartan Rash    History of Present Illness    Alyssa [name] has a PMH of pulmonary embolism in 2010, breast cancer, atrial fibrillation, hypertension, and GERD.  She was seen by Dr. Debara Pickett.  She was referred to the lipid clinic by her PCP.  She reported that her cholesterol has been elevated since she was in her 48s.  Both of her brothers had elevated cholesterol over 400.  Her mother also had elevated cholesterol and her father died from heart disease at age 67.  She reported statin intolerance and had been on several cholesterol-lowering medications including Crestor, Lipitor, lovastatin, and ezetimibe.  Her cholesterol was noted to be 343, triglycerides 154, HDL 40, and LDL 272.  She has lost around 40 pounds while following a ketogenic diet.  She reported that she has been taking Crestor but noted significant pain with that and finally discontinued the medication.  In 2013 she had a pulmonary embolus.  Her CT was  reviewed and did show multivessel coronary disease.   She was seen in follow-up on 01/04/2022 by Dr. Debara Pickett.  She was responding well to [place].  Her LDL cholesterol was 41.  She reported that her blood pressure continued to be uncontrolled.  She had been started on amlodipine 5 mg daily.  Her blood pressures at home ranged in the 140-1 160/80 range.  She reported rash with ARB type medications.  Her heart rate was noted to be 57.  She indicated that she was noticing intermittent periods of palpitations with no clear cause.  She was wearing an Apple Watch and had done EKGs.  She reported that interpretation noted suggestions for atrial fibrillation.  They were reviewed and indicated that she was having multiple alerts indicating atrial fibrillation.  Her CHA2DS2-VASc score was 3  for age, gender, and hypertension.  She was started on metoprolol 12.5 mg daily, amlodipine, she denied OSA.  She was started on apixaban 5 mg daily and follow-up was planned for 6 months.   She presented to clinic 02/24/22 for follow-up evaluation stated she had noticed frequent episodes of accelerated heart rate.  She hds been tracking the heart rates with her Apple Watch and iPhone.  She also had noticed that her average blood pressure had been 891-694 range systolic.  Initially in the office  her blood pressure was 142/90.  On recheck her blood pressure was 138/88.  We reviewed her medication regimen and she expressed understanding.  We reviewed her CVA risk.  I asked her take an extra dose of metoprolol for sustained elevated heart rate that lasts more than 45 minutes.  I  also increased her amlodipine to 5 mg in the morning and at 2.5 mg in the evening.  She reported compliance with apixaban and denies bleeding issues.  I planned follow-up as scheduled with Dr. Debara Pickett.  I  asked her to continue her physical activity and avoid triggers for atrial fibrillation.  She expressed understanding.   She presented to the emergency department  on 04/20/2022 with shortness of breath and a cough.  She reported that she had noted her symptoms for approximately 3 weeks.  She denied fever.  She did note some congestion.  She had been seen a couple of days prior by her PCP who had some concern for pneumonia.  Her chest x-ray was negative.  She reported that her coughing has been persistent and prompted her to seek further evaluation.  She indicated that she had been recently diagnosed with atrial fibrillation and was compliant with her apixaban and metoprolol.  Her blood pressure was 134/108 and her pulse was 76.  Her EKG showed sinus bradycardia with a rate of 59 bpm.  She underwent chest CT to rule out PE.  Her CT showed no pulmonary embolus and clear lung fields.  Her D-dimer was negative.  She presented to the clinic 04/25/22 for follow-up evaluation stated she had a cough for about 4 weeks.  We reviewed her recent ED visit and CT scan.  She expressed understanding.  We also reviewed her most recent EKG.  She reported compliance with her anticoagulation.  She had taken about 4 extra doses of her metoprolol.  She reported just mild improvement with nebulizer treatments while she was in the emergency department and mild improvement in her symptoms with using her inhaler.  I  ordered an echocardiogram, continued her current medication regimen, and planned follow-up for 1 to 2 months.  Her echocardiogram showed an LVEF of 50-38%, normal diastolic parameters, mild mitral valve regurgitation, mildly dilated left atria and no other significant valvular abnormalities.  She presents to the clinic today for follow-up evaluation states***  Today she denies chest pain, increased shortness of breath, lower extremity edema, fatigue, palpitations, melena, hematuria, hemoptysis, diaphoresis, weakness, presyncope, syncope, orthopnea, and PND.  Shortness of breath/DOE-breathing stable.  Echocardiogram reassuring.  Presented to the emergency department on 04/20/22 with  complaints of cough and shortness of breath.  Chest CT was negative for PE and lung fields were clear. Increase physical activity as tolerated Following with pulmonology  Coronary artery disease-no recent episodes of arm neck back or chest discomfort.  Noted to have multivessel coronary disease on chest CT. Continue metoprolol, Repatha Heart healthy low-sodium diet Increase physical activity as tolerated  Essential hypertension-BP today 106/64***.  Continue  amlodipine, metoprolol Continue amlodipine to 5 mg in a.m. and 2.5 mg in p.m. Heart healthy low-sodium diet Increase physical activity as tolerated   Paroxysmal atrial fibrillation-rate controlled.  Continues to note intermittent episodes of atrial fibrillation with Apple Watch.  Reports compliance with apixaban and denies bleeding issues. 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  Statin intolerance. Continue Repatha Heart healthy low-sodium high-fiber diet Increase physical activity as tolerated Continue weight loss      Disposition: Follow-up with Dr. Debara Pickett in 6 months.  Home Medications    Prior to Admission medications   Medication Sig Start Date End Date Taking? Authorizing Provider  albuterol (VENTOLIN HFA) 108 (90 Base) MCG/ACT inhaler Inhale 2 puffs into the lungs every 6 (six) hours as needed for wheezing or shortness of breath.  12/17/19   [provider]  amLODipine (NORVASC) 5 MG tablet Take 1 tablet (5 mg total) by mouth in the morning AND 0.5 tablets (2.5 mg total) every evening. 02/24/22   Deberah Pelton, NP  amphetamine-dextroamphetamine (ADDERALL) 20 MG tablet Take 20 mg by mouth See admin instructions. Take 51m in the morning and 249min  the afternoon.    [provider]  apixaban (ELIQUIS) 5 MG TABS tablet Take 1 tablet (5 mg total) by mouth 2 (two) times daily. 01/04/22   Hilty, KeNadean CorwinMD  cetirizine (ZYRTEC) 10 MG tablet Take by mouth.    [provider]  levothyroxine (SYNTHROID) 125 MCG tablet Take 125 mcg by mouth daily before breakfast. 01/16/22   [provider]  levothyroxine (SYNTHROID) 137 MCG tablet 1 tablet in the morning on an empty stomach 11/09/21   [provider]  metoprolol succinate (TOPROL-XL) 25 MG 24 hr tablet Take 0.5 tablets (12.5 mg total) by mouth daily. MAY TAKE EXTRA 1/2 TAB FOR SUSTAINED HR >100 FOR 45 MIN 02/24/22   ClDeberah PeltonNP  omeprazole (PRILOSEC) 40 MG capsule TAKE 1 CAPSULE BY MOUTH DAILY 03/20/19   RaBrand MalesMD  oxybutynin (DITROPAN-XL) 10 MG 24 hr tablet Take 10 mg by mouth daily.  05/14/18   [provider]  potassium chloride (KLOR-CON) 10 MEQ tablet TAKE 1 TABLET(10 MEQ) BY MOUTH DAILY 06/28/21   Hilty, KeNadean CorwinMD  REPATHA SURECLICK 14867G/ML SOAJ INJECT 1 DOSE INTO THE SKIN EVERY 14 DAYS 08/22/21   Hilty, KeNadean CorwinMD  Semaglutide (OZEMPIC, 0.25 OR 0.5 MG/DOSE, [place]) Inject 1 mg into the skin once a week.    [provider]  traZODone (DESYREL) 50 MG tablet Take 50 mg by mouth at bedtime. 02/12/22   [provider]    Family History    Family History  Problem Relation Age of Onset   Malignant hyperthermia Mother    Heart attack Father    Stroke Father    Diabetes Other        Grandmother   Other Other        respiratory- Grandmother   Cancer Maternal Grandmother [id number]     breast   Breast cancer Maternal Grandmother    She indicated that her mother is alive. She indicated that her father is deceased. She indicated that her brother is alive. She indicated that the status of her maternal grandmother is unknown. She indicated  that the status of her other is unknown.  Social History    Social History    Socioeconomic History   Marital status: Divorced    Spouse name: Not on file   Number of children: 2   Years of education: Master's   Highest education level: Not on file  Occupational History    Employer: [employer]  Tobacco Use   Smoking status: Never   Smokeless tobacco: Never  Vaping Use   Vaping Use: Never used  Substance and Sexual Activity   Alcohol use: No    Alcohol/week: 1.0 standard drink of alcohol    Types: 1 Glasses of wine per week   Drug use: No   Sexual activity: Yes    Comment: Menarche age 45, first live birth age 62,  [place] P2. She stopped having periods approximately 1999 and took HRT until 2005.  Other Topics Concern   Not on file  Social History Narrative   Patient is divorced and lives alone.   Patient drinks 3-4 cups of caffeine daily.    Patient is right handed.   Social Determinants of Health   Financial Resource Strain: Not on file  Food Insecurity: Not on file  Transportation Needs: Not on file  Physical Activity: Not on file  Stress: Not on file  Social Connections: Not on file  Intimate Partner Violence: Not on file     Review of Systems    General:  No chills, fever, night sweats or weight changes.  Cardiovascular:  No chest pain, dyspnea on exertion, edema, orthopnea, palpitations, paroxysmal nocturnal dyspnea. Dermatological: No rash, lesions/masses Respiratory: No cough, dyspnea Urologic: No hematuria, dysuria Abdominal:   No nausea, vomiting, diarrhea, bright red blood per rectum, melena, or hematemesis Neurologic:  No visual changes, wkns, changes in mental status. All other systems reviewed and are otherwise negative except as noted above.  Physical Exam    VS:  There were no vitals taken for this visit. , BMI There is no height or weight on file to calculate BMI. GEN: Well nourished, well developed, in no acute distress. HEENT: normal. Neck: Supple, no JVD, carotid bruits, or masses. Cardiac: RRR, no murmurs,  rubs, or gallops. No clubbing, cyanosis, edema.  Radials/DP/PT 2+ and equal bilaterally.  Respiratory:  Respirations regular and unlabored, clear to auscultation bilaterally. GI: Soft, nontender, nondistended, BS + x 4. MS: no deformity or atrophy. Skin: warm and dry, no rash. Neuro:  Strength and sensation are intact. Psych: Normal affect.  Accessory Clinical Findings    Recent Labs: 04/20/2022: B Natriuretic Peptide 90.4; BUN 22; Creatinine, Ser 0.78; Hemoglobin 12.9; Platelets 281; Potassium 3.9; Sodium 135   Recent Lipid Panel    Component Value Date/Time   CHOL 114 12/22/2021 1015   TRIG 104 12/22/2021 1015   HDL 54 12/22/2021 1015   CHOLHDL 2.1 12/22/2021 1015   LDLCALC 41 12/22/2021 1015    No BP recorded.  {Refresh Note OR Click here to enter BP  :1}***    ECG personally reviewed by me today-none today.  EKG 01/04/2022 Sinus bradycardia 57 bpm  EKG 04/21/2022 Sinus bradycardia 59 bpm   Echocardiogram 04/25/2022  IMPRESSIONS     1. Left ventricular ejection fraction, by estimation, is 55 to 60%. Left  ventricular ejection fraction by 3D volume is 59 %. The left ventricle has  normal function. The left ventricle has no regional wall motion  abnormalities. Left ventricular diastolic   parameters were normal.   2. Right ventricular systolic  function is normal. The right ventricular  size is normal. There is normal pulmonary artery systolic pressure. The  estimated right ventricular systolic pressure is 54.6 mmHg.   3. Left atrial size was mildly dilated.   4. The mitral valve is normal in structure. Mild mitral valve  regurgitation. No evidence of mitral stenosis.   5. The aortic valve was not well visualized. Aortic valve regurgitation  is not visualized. Mild aortic valve stenosis. Vmax 2.0 m/s, MG 9 mmHg,  AVA 1.7 cm^2, DI 0.54   6. The inferior vena cava is normal in size with greater than 50%  respiratory variability, suggesting right atrial pressure of 3  mmHg.   FINDINGS   Left Ventricle: Left ventricular ejection fraction, by estimation, is 55  to 60%. Left ventricular ejection fraction by 3D volume is 59 %. The left  ventricle has normal function. The left ventricle has no regional wall  motion abnormalities. The left  ventricular internal cavity size was normal in size. There is no left  ventricular hypertrophy. Left ventricular diastolic parameters were  normal.   Right Ventricle: The right ventricular size is normal. No increase in  right ventricular wall thickness. Right ventricular systolic function is  normal. There is normal pulmonary artery systolic pressure. The tricuspid  regurgitant velocity is 2.46 m/s, and   with an assumed right atrial pressure of 3 mmHg, the estimated right  ventricular systolic pressure is 27.0 mmHg.   Left Atrium: Left atrial size was mildly dilated.   Right Atrium: Right atrial size was normal in size.   Pericardium: Trivial pericardial effusion is present.   Mitral Valve: The mitral valve is normal in structure. Mild mitral valve  regurgitation. No evidence of mitral valve stenosis.   Tricuspid Valve: The tricuspid valve is normal in structure. Tricuspid  valve regurgitation is trivial.   Aortic Valve: The aortic valve was not well visualized. Aortic valve  regurgitation is not visualized. Mild aortic stenosis is present. Aortic  valve mean gradient measures 9.0 mmHg. Aortic valve peak gradient measures  16.5 mmHg. Aortic valve area, by VTI  measures 1.71 cm.   Pulmonic Valve: The pulmonic valve was not well visualized. Pulmonic valve  regurgitation is trivial.   Aorta: The aortic root and ascending aorta are structurally normal, with  no evidence of dilitation.   Venous: The inferior vena cava is normal in size with greater than 50%  respiratory variability, suggesting right atrial pressure of 3 mmHg.   IAS/Shunts: The interatrial septum was not well visualized.   Assessment &  Plan   1.  ***    Jossie Ng. Monnie Gudgel NP-C     06/19/2022, 7:30 AM [address] Office [phone] Fax [phone]  Notice: This dictation was prepared with Dragon dictation along with smaller phrase technology. Any transcriptional errors that result from this process are unintentional and may not be corrected upon review.  I spent 14 minutes examining this patient, reviewing medications, and using patient centered shared decision making involving her cardiac care.  Prior to her visit I spent greater than 20 minutes reviewing her past medical history,  medications, and prior cardiac tests.

## 2022-06-21 ENCOUNTER — Ambulatory Visit: Payer: Medicare PPO | Admitting: Orthopaedic Surgery

## 2022-06-22 ENCOUNTER — Ambulatory Visit: Payer: Medicare PPO | Admitting: Orthopaedic Surgery

## 2022-06-22 ENCOUNTER — Ambulatory Visit (INDEPENDENT_AMBULATORY_CARE_PROVIDER_SITE_OTHER): Payer: Medicare PPO

## 2022-06-22 DIAGNOSIS — S92352D Displaced fracture of fifth metatarsal bone, left foot, subsequent encounter for fracture with routine healing: Secondary | ICD-10-CM | POA: Diagnosis not present

## 2022-06-22 NOTE — Progress Notes (Signed)
Office Visit Note   Patient: Alyssa [name]           Date of Birth: [birth year]           MRN: [id number] Visit Date: 06/22/2022              Requested by: Nickola Major, MD [id number] [place],  [place] [address] PCP: Nickola Major, MD   Assessment & Plan: Visit Diagnoses:  1. Closed displaced fracture of fifth metatarsal bone of left foot with routine healing, subsequent encounter     Plan: Impression is 5-1/2 months status post left foot fifth metatarsal fracture.  Clinically and radiographically she is healed.  She will continue to advance with activity as tolerated.  Follow-up with [place] as needed.  Call with concerns or questions.  Follow-Up Instructions: Return if symptoms worsen or fail to improve.   Orders:  Orders Placed This Encounter  Procedures   XR Foot Complete Left   No orders of the defined types were placed in this encounter.     Procedures: No procedures performed   Clinical Data: No additional findings.   Subjective: Chief Complaint  Patient presents with   Left Foot - Pain    HPI patient is a pleasant 71 year old female who comes in today about 5 and half months status post left fifth metatarsal fracture.  She has been doing well.  She has been using a bone stimulator for the past 32 days.  She is walking with a carbon fiber footplate in her shoe which does seem to help.     Objective: Vital Signs: There were no vitals taken for this visit.    Ortho Exam left foot exam shows no swelling no ecchymosis.  No tenderness to fracture site.  Painless range of motion.  She is neurovascular tact distally.  Specialty Comments:  No specialty comments available.  Imaging: No results found.   PMFS History: Patient Active Problem List   Diagnosis Date Noted   Dyspnea on exertion 04/25/2022   Nondisplaced fracture of fifth metatarsal bone, right foot, initial encounter for closed fracture 01/30/2022   Pain in left shoulder  01/02/2022   Spondylosis of cervical region without myelopathy or radiculopathy 01/16/2020   Other spondylosis with radiculopathy, cervical region 06/13/2018   GERD (gastroesophageal reflux disease) 04/05/2018   Allergic rhinitis 04/05/2018   Upper airway cough syndrome 04/05/2018   Patellar tendinitis, right knee 03/01/2017   Status post total right knee replacement 12/22/2015   Hepatic steatosis 02/09/2015   Hot flashes 09/09/2014   Osteopenia 09/09/2014   Malignant neoplasm of upper-inner quadrant of left breast in female, estrogen receptor positive ([place]) 07/23/2013   Memory loss 07/14/2013   Headache 07/14/2013   Other malaise and fatigue 07/14/2013   Acute pulmonary embolism ([place]) 10/07/2011   Leukocytosis 10/07/2011   Thyroid mass, left inferior lobe, 3.6cm Mar2013 10/07/2011   Hyponatremia 10/07/2011   Obesity (BMI 30-39.9) 08/07/2011   Post concussion syndrome    Past Medical History:  Diagnosis Date   Abdominal pain    Anxiety    Arthritis    Asthma    Breast cancer ([place]) 06/2013   left upper inner   Cancer ([place])    breast   Diabetes mellitus without complication ([place])    Takes Tradjenta   Family history of adverse reaction to anesthesia    Patients mother had to be resusciated after having anesthesia   GERD (gastroesophageal reflux disease)    Headache(784.0)  Takes Depakote   Hernia, incisional periumbilical, incarcerated 9/83/3825   Hot flashes    Hx of radiation therapy 09/09/13- 10/13/13   left breast 5000 cGy in 25 sessions, no boost   Hyperlipidemia    Hypertension    PCP DR Nolon Rod   Hypothyroidism    Non-alcoholic fatty liver disease    Peripheral vascular disease ([place])    pulmonary embolis-still have some   Post concussion syndrome    4 yrs ago- sees [facility]- Dr Leonie Man every 6 months-   has sleep study 2011 following accident but was neg per patient-  short term memory issues, dates and times   Pulmonary embolism ([place]) 2013   Recurrent  upper respiratory infection (URI)    FLU 08/23/11-08/29/11 then URI 08/29/11- 09/08/11- S/P zpack and steroids-  improved      no cough or congestion   Thyroid disease    Thyroid mass, left inferior lobe, 3.6cm [id number] 10/07/2011   Ventral hernia     Family History  Problem Relation Age of Onset   Malignant hyperthermia Mother    Heart attack Father    Stroke Father    Diabetes Other        Grandmother   Other Other        respiratory- Grandmother   Cancer Maternal Grandmother 85       breast   Breast cancer Maternal Grandmother     Past Surgical History:  Procedure Laterality Date   ANTERIOR CERVICAL DECOMP/DISCECTOMY FUSION N/A 01/16/2020   Procedure: c5-6, c6-7 ANTERIOR CERVICAL DECOMPRESSION/DISCECTOMY FUSION, ALLOGRAFT AND PLATE;  Surgeon: Marybelle Killings, MD;  Location: [facility];  Service: Orthopedics;  Laterality: N/A;   APPENDECTOMY  2002   lap appy.  Dr. Hassell Done   BREAST BIOPSY     BREAST LUMPECTOMY Left 2015   radiation   BREAST LUMPECTOMY WITH NEEDLE LOCALIZATION AND AXILLARY SENTINEL LYMPH NODE BX Left 08/04/2013   Procedure: BREAST LUMPECTOMY WITH NEEDLE LOCALIZATION AND AXILLARY SENTINEL LYMPH NODE Biopsy x 3;  Surgeon: Rolm Bookbinder, MD;  Location: [facility];  Service: General;  Laterality: Left;   BREAST SURGERY     CARPAL TUNNEL RELEASE  10/2009-12/2010   left - right   CESAREAN SECTION  X 2   COLONOSCOPY     DIAGNOSTIC LAPAROSCOPY     Lap. chole., appendectomy, hernia repair   HERNIA REPAIR  09/15/11   Ventral w/mesh   JOINT REPLACEMENT     KNEE ARTHROPLASTY Right 12/22/2015   Procedure: COMPUTER ASSISTED TOTAL KNEE ARTHROPLASTY;  Surgeon: Marybelle Killings, MD;  Location: [facility];  Service: Orthopedics;  Laterality: Right;   LAPAROSCOPIC CHOLECYSTECTOMY  2006   Dr. Gean Maidens TUNNEL RELEASE Bilateral    THYROIDECTOMY, PARTIAL     VENTRAL HERNIA REPAIR  09/29/2011   Procedure: LAPAROSCOPIC VENTRAL HERNIA;  Surgeon: Adin Hector, MD;  Location: [facility];  Service: General;   Laterality: N/A;  Laparoscopic Ventral Wall Hernia Repair with Mesh   Social History   Occupational History    Employer: [employer]  Tobacco Use   Smoking status: Never   Smokeless tobacco: Never  Vaping Use   Vaping Use: Never used  Substance and Sexual Activity   Alcohol use: No    Alcohol/week: 1.0 standard drink of alcohol    Types: 1 Glasses of wine per week   Drug use: No   Sexual activity: Yes    Comment: Menarche age 25, first live birth age 75,  Gallia  P2. She stopped having periods approximately 1999 and took HRT until 2005.

## 2022-06-25 ENCOUNTER — Other Ambulatory Visit: Payer: Self-pay | Admitting: Internal Medicine

## 2022-06-25 DIAGNOSIS — E7849 Other hyperlipidemia: Secondary | ICD-10-CM

## 2022-06-27 ENCOUNTER — Ambulatory Visit: Payer: Medicare PPO | Admitting: General Practice

## 2022-06-30 ENCOUNTER — Other Ambulatory Visit: Payer: Self-pay | Admitting: General Practice

## 2022-07-02 ENCOUNTER — Other Ambulatory Visit: Payer: Self-pay | Admitting: Internal Medicine

## 2022-07-13 ENCOUNTER — Ambulatory Visit: Payer: Medicare PPO | Attending: Internal Medicine | Admitting: Internal Medicine

## 2022-07-13 ENCOUNTER — Encounter: Payer: Self-pay | Admitting: Internal Medicine

## 2022-07-13 ENCOUNTER — Other Ambulatory Visit: Payer: Self-pay | Admitting: Internal Medicine

## 2022-07-13 VITALS — BP 164/110 | HR 106 | Ht 62.0 in | Wt 199.4 lb

## 2022-07-13 DIAGNOSIS — E7849 Other hyperlipidemia: Secondary | ICD-10-CM | POA: Diagnosis not present

## 2022-07-13 DIAGNOSIS — I4892 Unspecified atrial flutter: Secondary | ICD-10-CM

## 2022-07-13 DIAGNOSIS — E785 Hyperlipidemia, unspecified: Secondary | ICD-10-CM

## 2022-07-13 DIAGNOSIS — Z01812 Encounter for preprocedural laboratory examination: Secondary | ICD-10-CM

## 2022-07-13 MED ORDER — AMLODIPINE BESYLATE 5 MG PO TABS: ORAL_TABLET | ORAL | 3 refills | Status: DC

## 2022-07-13 NOTE — H&P (View-Only) (Signed)
LIPID CLINIC CONSULT NOTE  Chief Complaint:  Follow-up dyslipidemia  Primary Care Physician: Nickola Major, MD  HPI:  Alyssa [name] is a 71 y.o. female who is being seen today for the evaluation of dyslipidemia at the request of Alyssa [name], Alyssa Conroy, MD.  This is a very pleasant 71 year old female with a significant history of dyslipidemia.  She first noted she had elevated cholesterol in her 49s.  She has a strong family history of elevated cholesterol and heart disease.  Both of her brothers are age 47 and 88 and have elevated cholesterols over 400.  Her mom also had elevated cholesterol and her father had heart disease and died at age 66.  Unfortunately she has been intolerant to statins and is been on numerous statins in the past including Crestor, Lipitor, lovastatin and ezetimibe.  She also has nonalcoholic fatty liver disease.  Most recently her total cholesterol was 343, triglycerides 154, HDL of 40 and LDL of 272.  This is after losing 40 pounds recently although she is still moderately obese on the ketogenic diet.  She is also been taking Crestor but had significant pain with it and then finally discontinued it.  She has no known coronary disease although did have a pulmonary embolus in 2013.  I personally reviewed her CT scan which does show multivessel coronary artery calcification.  Is a history of breast cancer, diabetes which has resolved with weight loss, and postconcussive syndrome.  She reports that she had had a fairly atherogenic diet however recently again switched ketogenic diet and is cut out a lot of "bad saturated fats".  03/13/2019  Alyssa [name] returns today for follow-up.  Recently had repeat lipids which showed marked improvement in numbers.  Total cholesterol is now 168 with triglycerides 132, HDL 52 and LDL of 90.  He is tolerating Praluent without any side effects.  Overall she is very pleased with her improved cholesterol numbers.  10/17/2019  Alyssa [name] returns  today for follow-up.  Overall she is doing well.  Her repeat lipid recently showed total cholesterol 151, triglycerides 142, HDL 51 and LDL 75.  This is slightly improved and she has subsequently switched from Praluent to [place] due to a change in her primary insurance.  Overall she is pleased with the medication.  12/01/2020  Alyssa [name] continues to have good control of her dyslipidemia.  Total cholesterol 150, HDL 52, triglycerides 133 and LDL 75.  She is tolerating Repatha well.  Unfortunately, she has had issues with elevated blood pressure.  She reports her primary has tried a number of different medications but they have either not gotten her to target or have been associated with side effects.  More recently she was switched off of the diuretic and onto amlodipine.  Initially this was 5 mg but then increased to 7.5 mg daily.  Since then she has had more lower extremity edema.  Blood pressure remains uncontrolled now 148/82 but improved as it was previously between 607 and 371 systolic.  She is asked for my assistance in managing her blood pressure.  06/23/2021  Alyssa [name] returns today for follow-up.  Unfortunately she became intolerant to the chlorthalidone was having a lot of side effects.  He may have been related to some hypokalemia.  She stopped her chlorthalidone and her symptoms improved.  Her blood pressure has been maintained on amlodipine.  She had lab work this summer in August through her PCP which showed a direct LDL 184, total cholesterol 250  and triglycerides 209.  This is much higher than it previously had been in March with a total cholesterol 150 and LDL 75.  She reports compliance with the medication so is difficult to understand why the cholesterol was so much higher.  I think this has to be reassessed  01/04/2022  Alyssa [name] is seen today in follow-up.  Fortunately her lipids have responded again to [place].  Total cholesterol now 114, glycerides 104, HDL 54 and LDL 41.  She  reports that her blood pressure still is uncontrolled.  She had been started on amlodipine 5 mg daily.  Blood pressures at home range between 979-892 systolic over 80.  She needs additional blood pressure lowering.  She has had some medicine intolerances which make this difficult including rash with an ARB.  Heart rate was noted to be low today however shows a sinus bradycardia at 57.  She says that she gets occasional palpitations recently and noted her heart rate has been jumping up for no reason.  She does wear an Apple Watch and has done EKGs of this.  She did not pay much attention to it but did note that the EKG interpretations on the Apple Watch suggested atrial fibrillation.  I reviewed these today and indeed she is having multiple episodes of atrial fibrillation.  This is a new diagnosis.  Her CHA2DS2-VASc score is at least 3 for age, female sex and hypertension.  07/13/2022  Alyssa [name] is seen today in follow-up.  Her blood pressure was surprisingly elevated today 164/110.  EKG shows a new onset atrial flutter with variable ventricular response.  She noted her heart rate was elevated but was not aware that she was out of rhythm.  She has felt more fatigued recently.  She denies any chest pain.  Fortunately she is already anticoagulated on Eliquis and reports compliance with the medication.  She has been very compliant with her Repatha and has had marked improvement in her lipids.  Total cholesterol 114, HDL 54, triglycerides 104 and LDL 41.  PMHx:  Past Medical History:  Diagnosis Date   Abdominal pain    Anxiety    Arthritis    Asthma    Breast cancer ([place] [facility]) 06/2013   left upper inner   Cancer ([place])    breast   Diabetes mellitus without complication ([place])    Takes Tradjenta   Family history of adverse reaction to anesthesia    Patients mother had to be resusciated after having anesthesia   GERD (gastroesophageal reflux disease)    Headache(784.0)    Takes Depakote   Hernia,  incisional periumbilical, incarcerated 08/11/4172   Hot flashes    Hx of radiation therapy 09/09/13- 10/13/13   left breast 5000 cGy in 25 sessions, no boost   Hyperlipidemia    Hypertension    PCP DR Nolon Rod   Hypothyroidism    Non-alcoholic fatty liver disease    Peripheral vascular disease ([place])    pulmonary embolis-still have some   Post concussion syndrome    4 yrs ago- sees [facility]- Dr Leonie Man every 6 months-   has sleep study 2011 following accident but was neg per patient-  short term memory issues, dates and times   Pulmonary embolism ([place]) 2013   Recurrent upper respiratory infection (URI)    FLU 08/23/11-08/29/11 then URI 08/29/11- 09/08/11- S/P zpack and steroids-  improved      no cough or congestion   Thyroid disease    Thyroid mass, left inferior lobe, 3.6cm  [id number] 10/07/2011   Ventral hernia     Past Surgical History:  Procedure Laterality Date   ANTERIOR CERVICAL DECOMP/DISCECTOMY FUSION N/A 01/16/2020   Procedure: c5-6, c6-7 ANTERIOR CERVICAL DECOMPRESSION/DISCECTOMY FUSION, ALLOGRAFT AND PLATE;  Surgeon: Marybelle Killings, MD;  Location: [facility];  Service: Orthopedics;  Laterality: N/A;   APPENDECTOMY  2002   lap appy.  Dr. Hassell Done   BREAST BIOPSY     BREAST LUMPECTOMY Left 2015   radiation   BREAST LUMPECTOMY WITH NEEDLE LOCALIZATION AND AXILLARY SENTINEL LYMPH NODE BX Left 08/04/2013   Procedure: BREAST LUMPECTOMY WITH NEEDLE LOCALIZATION AND AXILLARY SENTINEL LYMPH NODE Biopsy x 3;  Surgeon: Rolm Bookbinder, MD;  Location: [facility];  Service: General;  Laterality: Left;   BREAST SURGERY     CARPAL TUNNEL RELEASE  10/2009-12/2010   left - right   CESAREAN SECTION  X 2   COLONOSCOPY     DIAGNOSTIC LAPAROSCOPY     Lap. chole., appendectomy, hernia repair   HERNIA REPAIR  09/15/11   Ventral w/mesh   JOINT REPLACEMENT     KNEE ARTHROPLASTY Right 12/22/2015   Procedure: COMPUTER ASSISTED TOTAL KNEE ARTHROPLASTY;  Surgeon: Marybelle Killings, MD;  Location: [facility];  Service:  Orthopedics;  Laterality: Right;   LAPAROSCOPIC CHOLECYSTECTOMY  2006   Dr. Gean Maidens TUNNEL RELEASE Bilateral    THYROIDECTOMY, PARTIAL     VENTRAL HERNIA REPAIR  09/29/2011   Procedure: LAPAROSCOPIC VENTRAL HERNIA;  Surgeon: Adin Hector, MD;  [facility];  Service: General;  Laterality: N/A;  Laparoscopic Ventral Wall Hernia Repair with Mesh    FAMHx:  Family History  Problem Relation Age of Onset   Malignant hyperthermia Mother    Heart attack Father    Stroke Father    Diabetes Other        Grandmother   Other Other        respiratory- Grandmother   Cancer Maternal Grandmother 20       breast   Breast cancer Maternal Grandmother     SOCHx:   reports that she has never smoked. She has never used smokeless tobacco. She reports that she does not drink alcohol and does not use drugs.  ALLERGIES:  Allergies  Allergen Reactions   Aleve [Naproxen Sodium] Hives, Itching and Swelling   Aromasin [Exemestane] Anaphylaxis, Itching and Swelling    Pt currently tolerating medication well (02/03/16)-gwd; Pt stated "I can take this medication for a few months at a time and I take a benadryl when I feel it starting to come on" Patient has facial swelling, itching, and throat swelling  07/12/17  Taking this right now and no problem. sy/rn   Letrozole Shortness Of Breath, Rash and Other (See Comments)    Swelling - feet, eyes,hands;   Speech garbled   Levofloxacin Rash and Swelling    Facial swelling and red facial rash   Penicillins Hives, Swelling and Rash    All over the body. Has patient had a PCN reaction causing immediate rash, facial/tongue/throat swelling, SOB or lightheadedness with hypotension: Yes Has patient had a PCN reaction causing severe rash involving mucus membranes or skin necrosis: No Has patient had a PCN reaction that required hospitalization No Has patient had a PCN reaction occurring within the last 10 years: No If all of the above answers are "NO",  then may proceed with Cephalosporin use.    Symbicort [Budesonide-Formoterol Fumarate] Anaphylaxis, Hives, Swelling and Rash   Codeine Other (See Comments)  hallucinations   Percocet [Oxycodone-Acetaminophen] Other (See Comments)    Causes syncope day after medication has been taken.   Vicodin [Hydrocodone-Acetaminophen] Other (See Comments)    Causes syncope day after medication has been taken.   Doxycycline Swelling    Must with  antihistamine   Ezetimibe Other (See Comments)    Myalgias,fatigue   Prednisone Other (See Comments)    Can take with antihistamine/ can take the shot without antihistamine   Cefdinir Rash and Swelling   Losartan Rash    ROS: Pertinent items noted in HPI and remainder of comprehensive ROS otherwise negative.  HOME MEDS: Current Outpatient Medications on File Prior to Visit  Medication Sig Dispense Refill   albuterol (VENTOLIN HFA) 108 (90 Base) MCG/ACT inhaler Inhale 2 puffs into the lungs every 6 (six) hours as needed for wheezing or shortness of breath.      amLODipine (NORVASC) 5 MG tablet Take 1 tablet (5 mg total) by mouth in the morning AND 0.5 tablets (2.5 mg total) every evening. 45 tablet 6   amphetamine-dextroamphetamine (ADDERALL) 20 MG tablet Take 20 mg by mouth See admin instructions. Take 63m in the morning and 264min the afternoon.     apixaban (ELIQUIS) 5 MG TABS tablet Take 1 tablet (5 mg total) by mouth 2 (two) times daily. 60 tablet 11   cetirizine (ZYRTEC) 10 MG tablet Take by mouth.     fluticasone (FLOVENT HFA) 110 MCG/ACT inhaler Inhale 2 puffs into the lungs daily. 12 g 5   levothyroxine (SYNTHROID) 125 MCG tablet Take 112 mcg by mouth daily before breakfast.     methylPREDNISolone (MEDROL DOSEPAK) 4 MG TBPK tablet Take as directed 21 tablet 0   metoprolol succinate (TOPROL-XL) 25 MG 24 hr tablet TAKE 1/2 TABLET(12.5 MG) BY MOUTH DAILY. MAY TAKE EXTRA 1/2 TABLET FOR SUSTAINED HEART RATE>100 FOR 45 MINUTES 30 tablet 3    omeprazole (PRILOSEC) 40 MG capsule TAKE 1 CAPSULE BY MOUTH DAILY 30 capsule 0   potassium chloride (KLOR-CON) 10 MEQ tablet TAKE 1 TABLET(10 MEQ) BY MOUTH DAILY 90 tablet 3   REPATHA SURECLICK 14350G/ML SOAJ INJECT 1 PEN INTO THE SKIN EVERY 14 DAYS 2 mL 11   Semaglutide (OZEMPIC, 0.25 OR 0.5 MG/DOSE, [place]) Inject 2 mg into the skin once a week.     Vilazodone HCl (VIIBRYD) 40 MG TABS Take 40 mg by mouth daily.     Current Facility-Administered Medications on File Prior to Visit  Medication Dose Route Frequency Provider Last Rate Last Admin   fluticasone (FLOVENT HFA) 44 MCG/ACT inhaler 2 puff  2 puff Inhalation BID SoChesley MiresMD        LABS/IMAGING: No results found for this or any previous visit (from the past 48 hour(s)). No results found.  LIPID PANEL:    Component Value Date/Time   CHOL 114 12/22/2021 1015   TRIG 104 12/22/2021 1015   HDL 54 12/22/2021 1015   CHOLHDL 2.1 12/22/2021 1015   LDLCALC 41 12/22/2021 1015    WEIGHTS: Wt Readings from Last 3 Encounters:  07/13/22 199 lb 6.4 oz (90.4 kg)  04/25/22 200 lb (90.7 kg)  04/25/22 199 lb 6.4 oz (90.4 kg)    VITALS: BP (!) 164/110   Pulse (!) 106   Ht 5' 2"  (1.575 m)   Wt 199 lb 6.4 oz (90.4 kg)   SpO2 97%   BMI 36.47 kg/m   EXAM: General appearance: alert, no distress, and moderately obese Lungs: clear to auscultation bilaterally  Heart: irregularly irregular rhythm Extremities: extremities normal, atraumatic, no cyanosis or edema Neurologic: Grossly normal Psych: Pleasant  EKG: Atrial flutter with variable A-V response at 106 personally reviewed  ASSESSMENT: Persistent atrial flutter with variable ventricular response Paroxysmal atrial fibrillation-CHA2DS2-VASc score of 3 Probable HeFH-Dutch Score of 6 Family history of premature coronary disease Statin intolerance Multivessel coronary calcification Uncontrolled hypertension  PLAN: 1.   Mrs. Gebhardt he is in atrial flutter today with variable  ventricular response is tachycardic.  She has been short of breath.  As this is a stable rhythm, I suspect she will ultimately need cardioversion.  We will try to arrange for that soon.  I have to cardioversion today scheduled next week however they were unable to accommodate her in the endoscopy suite, therefore she is scheduled I believe the first week of January.  If there is a possible cancellation I will reach out to her.  She was advised not to miss any doses of the Eliquis.  Will plan follow-up after her cardioversion.  Shared Decision Making/Informed Consent The risks (stroke, cardiac arrhythmias rarely resulting in the need for a temporary or permanent pacemaker, skin irritation or burns and complications associated with conscious sedation including aspiration, arrhythmia, respiratory failure and death), benefits (restoration of normal sinus rhythm) and alternatives of a direct current cardioversion were explained in detail to Ms. Seigler and she agrees to proceed.    Pixie Casino, MD, [facility], [place] Director of the Advanced Lipid Disorders &  Cardiovascular Risk Reduction Clinic Diplomate of the American Board of Clinical Lipidology Attending Cardiologist  Direct Dial: [phone]  Fax: [phone]  Website:  [email or web address] 07/13/2022, 10:00 AM

## 2022-07-13 NOTE — Progress Notes (Signed)
LIPID CLINIC CONSULT NOTE  Chief Complaint:  Follow-up dyslipidemia  Primary Care Physician: Nickola Major, MD  HPI:  Alyssa [name] is a 71 y.o. female who is being seen today for the evaluation of dyslipidemia at the request of Eksir, Earnest Conroy, MD.  This is a very pleasant 71 year old female with a significant history of dyslipidemia.  She first noted she had elevated cholesterol in her 87s.  She has a strong family history of elevated cholesterol and heart disease.  Both of her brothers are age 39 and 48 and have elevated cholesterols over 400.  Her mom also had elevated cholesterol and her father had heart disease and died at age 65.  Unfortunately she has been intolerant to statins and is been on numerous statins in the past including Crestor, Lipitor, lovastatin and ezetimibe.  She also has nonalcoholic fatty liver disease.  Most recently her total cholesterol was 343, triglycerides 154, HDL of 40 and LDL of 272.  This is after losing 40 pounds recently although she is still moderately obese on the ketogenic diet.  She is also been taking Crestor but had significant pain with it and then finally discontinued it.  She has no known coronary disease although did have a pulmonary embolus in 2013.  I personally reviewed her CT scan which does show multivessel coronary artery calcification.  Is a history of breast cancer, diabetes which has resolved with weight loss, and postconcussive syndrome.  She reports that she had had a fairly atherogenic diet however recently again switched ketogenic diet and is cut out a lot of "bad saturated fats".  03/13/2019  Alyssa [name] returns today for follow-up.  Recently had repeat lipids which showed marked improvement in numbers.  Total cholesterol is now 168 with triglycerides 132, HDL 52 and LDL of 90.  He is tolerating Praluent without any side effects.  Overall she is very pleased with her improved cholesterol numbers.  10/17/2019  Alyssa [name] returns  today for follow-up.  Overall she is doing well.  Her repeat lipid recently showed total cholesterol 151, triglycerides 142, HDL 51 and LDL 75.  This is slightly improved and she has subsequently switched from Praluent to [place] due to a change in her primary insurance.  Overall she is pleased with the medication.  12/01/2020  Alyssa [name] continues to have good control of her dyslipidemia.  Total cholesterol 150, HDL 52, triglycerides 133 and LDL 75.  She is tolerating Repatha well.  Unfortunately, she has had issues with elevated blood pressure.  She reports her primary has tried a number of different medications but they have either not gotten her to target or have been associated with side effects.  More recently she was switched off of the diuretic and onto amlodipine.  Initially this was 5 mg but then increased to 7.5 mg daily.  Since then she has had more lower extremity edema.  Blood pressure remains uncontrolled now 148/82 but improved as it was previously between 536 and 144 systolic.  She is asked for my assistance in managing her blood pressure.  06/23/2021  Alyssa [name] returns today for follow-up.  Unfortunately she became intolerant to the chlorthalidone was having a lot of side effects.  He may have been related to some hypokalemia.  She stopped her chlorthalidone and her symptoms improved.  Her blood pressure has been maintained on amlodipine.  She had lab work this summer in August through her PCP which showed a direct LDL 184, total cholesterol 250  and triglycerides 209.  This is much higher than it previously had been in March with a total cholesterol 150 and LDL 75.  She reports compliance with the medication so is difficult to understand why the cholesterol was so much higher.  I think this has to be reassessed  01/04/2022  Alyssa [name] is seen today in follow-up.  Fortunately her lipids have responded again to Rafael Hernandez.  Total cholesterol now 114, glycerides 104, HDL 54 and LDL 41.  She  reports that her blood pressure still is uncontrolled.  She had been started on amlodipine 5 mg daily.  Blood pressures at home range between 161-096 systolic over 80.  She needs additional blood pressure lowering.  She has had some medicine intolerances which make this difficult including rash with an ARB.  Heart rate was noted to be low today however shows a sinus bradycardia at 57.  She says that she gets occasional palpitations recently and noted her heart rate has been jumping up for no reason.  She does wear an Apple Watch and has done EKGs of this.  She did not pay much attention to it but did note that the EKG interpretations on the Apple Watch suggested atrial fibrillation.  I reviewed these today and indeed she is having multiple episodes of atrial fibrillation.  This is a new diagnosis.  Her CHA2DS2-VASc score is at least 3 for age, female sex and hypertension.  07/13/2022  Alyssa [name] is seen today in follow-up.  Her blood pressure was surprisingly elevated today 164/110.  EKG shows a new onset atrial flutter with variable ventricular response.  She noted her heart rate was elevated but was not aware that she was out of rhythm.  She has felt more fatigued recently.  She denies any chest pain.  Fortunately she is already anticoagulated on Eliquis and reports compliance with the medication.  She has been very compliant with her Repatha and has had marked improvement in her lipids.  Total cholesterol 114, HDL 54, triglycerides 104 and LDL 41.  PMHx:  Past Medical History:  Diagnosis Date   Abdominal pain    Anxiety    Arthritis    Asthma    Breast cancer ([name]) 06/2013   left upper inner   Cancer ([name])    breast   Diabetes mellitus without complication ([name])    Takes Tradjenta   Family history of adverse reaction to anesthesia    Patients mother had to be resusciated after having anesthesia   GERD (gastroesophageal reflux disease)    Headache(784.0)    Takes Depakote   Hernia,  incisional periumbilical, incarcerated 0/45/4098   Hot flashes    Hx of radiation therapy 09/09/13- 10/13/13   left breast 5000 cGy in 25 sessions, no boost   Hyperlipidemia    Hypertension    PCP DR Nolon Rod   Hypothyroidism    Non-alcoholic fatty liver disease    Peripheral vascular disease ([place])    pulmonary embolis-still have some   Post concussion syndrome    4 yrs ago- sees [facility]- Dr Leonie Man every 6 months-   has sleep study 2011 following accident but was neg per patient-  short term memory issues, dates and times   Pulmonary embolism ([place]) 2013   Recurrent upper respiratory infection (URI)    FLU 08/23/11-08/29/11 then URI 08/29/11- 09/08/11- S/P zpack and steroids-  improved      no cough or congestion   Thyroid disease    Thyroid mass, left inferior lobe, 3.6cm  [id number] 10/07/2011   Ventral hernia     Past Surgical History:  Procedure Laterality Date   ANTERIOR CERVICAL DECOMP/DISCECTOMY FUSION N/A 01/16/2020   Procedure: c5-6, c6-7 ANTERIOR CERVICAL DECOMPRESSION/DISCECTOMY FUSION, ALLOGRAFT AND PLATE;  Surgeon: Marybelle Killings, MD;  Location: [facility];  Service: Orthopedics;  Laterality: N/A;   APPENDECTOMY  2002   lap appy.  Dr. Hassell Done   BREAST BIOPSY     BREAST LUMPECTOMY Left 2015   radiation   BREAST LUMPECTOMY WITH NEEDLE LOCALIZATION AND AXILLARY SENTINEL LYMPH NODE BX Left 08/04/2013   Procedure: BREAST LUMPECTOMY WITH NEEDLE LOCALIZATION AND AXILLARY SENTINEL LYMPH NODE Biopsy x 3;  Surgeon: Rolm Bookbinder, MD;  Location: [facility];  Service: General;  Laterality: Left;   BREAST SURGERY     CARPAL TUNNEL RELEASE  10/2009-12/2010   left - right   CESAREAN SECTION  X 2   COLONOSCOPY     DIAGNOSTIC LAPAROSCOPY     Lap. chole., appendectomy, hernia repair   HERNIA REPAIR  09/15/11   Ventral w/mesh   JOINT REPLACEMENT     KNEE ARTHROPLASTY Right 12/22/2015   Procedure: COMPUTER ASSISTED TOTAL KNEE ARTHROPLASTY;  Surgeon: Marybelle Killings, MD;  Location: [facility];  Service:  Orthopedics;  Laterality: Right;   LAPAROSCOPIC CHOLECYSTECTOMY  2006   Dr. Gean Maidens TUNNEL RELEASE Bilateral    THYROIDECTOMY, PARTIAL     VENTRAL HERNIA REPAIR  09/29/2011   Procedure: LAPAROSCOPIC VENTRAL HERNIA;  Surgeon: Adin Hector, MD;  Location: [facility];  Service: General;  Laterality: N/A;  Laparoscopic Ventral Wall Hernia Repair with Mesh    FAMHx:  Family History  Problem Relation Age of Onset   Malignant hyperthermia Mother    Heart attack Father    Stroke Father    Diabetes Other        Grandmother   Other Other        respiratory- Grandmother   Cancer Maternal Grandmother 62       breast   Breast cancer Maternal Grandmother     SOCHx:   reports that she has never smoked. She has never used smokeless tobacco. She reports that she does not drink alcohol and does not use drugs.  ALLERGIES:  Allergies  Allergen Reactions   Aleve [Naproxen Sodium] Hives, Itching and Swelling   Aromasin [Exemestane] Anaphylaxis, Itching and Swelling    Pt currently tolerating medication well (02/03/16)-gwd; Pt stated "I can take this medication for a few months at a time and I take a benadryl when I feel it starting to come on" Patient has facial swelling, itching, and throat swelling  07/12/17  Taking this right now and no problem. sy/rn   Letrozole Shortness Of Breath, Rash and Other (See Comments)    Swelling - feet, eyes,hands;   Speech garbled   Levofloxacin Rash and Swelling    Facial swelling and red facial rash   Penicillins Hives, Swelling and Rash    All over the body. Has patient had a PCN reaction causing immediate rash, facial/tongue/throat swelling, SOB or lightheadedness with hypotension: Yes Has patient had a PCN reaction causing severe rash involving mucus membranes or skin necrosis: No Has patient had a PCN reaction that required hospitalization No Has patient had a PCN reaction occurring within the last 10 years: No If all of the above answers are "NO",  then may proceed with Cephalosporin use.    Symbicort [Budesonide-Formoterol Fumarate] Anaphylaxis, Hives, Swelling and Rash   Codeine Other (See Comments)  hallucinations   Percocet [Oxycodone-Acetaminophen] Other (See Comments)    Causes syncope day after medication has been taken.   Vicodin [Hydrocodone-Acetaminophen] Other (See Comments)    Causes syncope day after medication has been taken.   Doxycycline Swelling    Must with  antihistamine   Ezetimibe Other (See Comments)    Myalgias,fatigue   Prednisone Other (See Comments)    Can take with antihistamine/ can take the shot without antihistamine   Cefdinir Rash and Swelling   Losartan Rash    ROS: Pertinent items noted in HPI and remainder of comprehensive ROS otherwise negative.  HOME MEDS: Current Outpatient Medications on File Prior to Visit  Medication Sig Dispense Refill   albuterol (VENTOLIN HFA) 108 (90 Base) MCG/ACT inhaler Inhale 2 puffs into the lungs every 6 (six) hours as needed for wheezing or shortness of breath.      amLODipine (NORVASC) 5 MG tablet Take 1 tablet (5 mg total) by mouth in the morning AND 0.5 tablets (2.5 mg total) every evening. 45 tablet 6   amphetamine-dextroamphetamine (ADDERALL) 20 MG tablet Take 20 mg by mouth See admin instructions. Take 83m in the morning and 276min the afternoon.     apixaban (ELIQUIS) 5 MG TABS tablet Take 1 tablet (5 mg total) by mouth 2 (two) times daily. 60 tablet 11   cetirizine (ZYRTEC) 10 MG tablet Take by mouth.     fluticasone (FLOVENT HFA) 110 MCG/ACT inhaler Inhale 2 puffs into the lungs daily. 12 g 5   levothyroxine (SYNTHROID) 125 MCG tablet Take 112 mcg by mouth daily before breakfast.     methylPREDNISolone (MEDROL DOSEPAK) 4 MG TBPK tablet Take as directed 21 tablet 0   metoprolol succinate (TOPROL-XL) 25 MG 24 hr tablet TAKE 1/2 TABLET(12.5 MG) BY MOUTH DAILY. MAY TAKE EXTRA 1/2 TABLET FOR SUSTAINED HEART RATE>100 FOR 45 MINUTES 30 tablet 3    omeprazole (PRILOSEC) 40 MG capsule TAKE 1 CAPSULE BY MOUTH DAILY 30 capsule 0   potassium chloride (KLOR-CON) 10 MEQ tablet TAKE 1 TABLET(10 MEQ) BY MOUTH DAILY 90 tablet 3   REPATHA SURECLICK 14245G/ML SOAJ INJECT 1 PEN INTO THE SKIN EVERY 14 DAYS 2 mL 11   Semaglutide (OZEMPIC, 0.25 OR 0.5 MG/DOSE, [place]) Inject 2 mg into the skin once a week.     Vilazodone HCl (VIIBRYD) 40 MG TABS Take 40 mg by mouth daily.     Current Facility-Administered Medications on File Prior to Visit  Medication Dose Route Frequency Provider Last Rate Last Admin   fluticasone (FLOVENT HFA) 44 MCG/ACT inhaler 2 puff  2 puff Inhalation BID SoChesley MiresMD        LABS/IMAGING: No results found for this or any previous visit (from the past 48 hour(s)). No results found.  LIPID PANEL:    Component Value Date/Time   CHOL 114 12/22/2021 1015   TRIG 104 12/22/2021 1015   HDL 54 12/22/2021 1015   CHOLHDL 2.1 12/22/2021 1015   LDLCALC 41 12/22/2021 1015    WEIGHTS: Wt Readings from Last 3 Encounters:  07/13/22 199 lb 6.4 oz (90.4 kg)  04/25/22 200 lb (90.7 kg)  04/25/22 199 lb 6.4 oz (90.4 kg)    VITALS: BP (!) 164/110   Pulse (!) 106   Ht 5' 2"  (1.575 m)   Wt 199 lb 6.4 oz (90.4 kg)   SpO2 97%   BMI 36.47 kg/m   EXAM: General appearance: alert, no distress, and moderately obese Lungs: clear to auscultation bilaterally  Heart: irregularly irregular rhythm Extremities: extremities normal, atraumatic, no cyanosis or edema Neurologic: Grossly normal Psych: Pleasant  EKG: Atrial flutter with variable A-V response at 106 personally reviewed  ASSESSMENT: Persistent atrial flutter with variable ventricular response Paroxysmal atrial fibrillation-CHA2DS2-VASc score of 3 Probable HeFH-Dutch Score of 6 Family history of premature coronary disease Statin intolerance Multivessel coronary calcification Uncontrolled hypertension  PLAN: 1.   Mrs. Meditz he is in atrial flutter today with variable  ventricular response is tachycardic.  She has been short of breath.  As this is a stable rhythm, I suspect she will ultimately need cardioversion.  We will try to arrange for that soon.  I have to cardioversion today scheduled next week however they were unable to accommodate her in the endoscopy suite, therefore she is scheduled I believe the first week of January.  If there is a possible cancellation I will reach out to her.  She was advised not to miss any doses of the Eliquis.  Will plan follow-up after her cardioversion.  Shared Decision Making/Informed Consent The risks (stroke, cardiac arrhythmias rarely resulting in the need for a temporary or permanent pacemaker, skin irritation or burns and complications associated with conscious sedation including aspiration, arrhythmia, respiratory failure and death), benefits (restoration of normal sinus rhythm) and alternatives of a direct current cardioversion were explained in detail to Ms. Winebarger and she agrees to proceed.    Pixie Casino, MD, [facility], [place] Director of the Advanced Lipid Disorders &  Cardiovascular Risk Reduction Clinic Diplomate of the American Board of Clinical Lipidology Attending Cardiologist  Direct Dial: [phone]  Fax: [phone]  Website:  [email or web address] 07/13/2022, 10:00 AM

## 2022-07-13 NOTE — Patient Instructions (Signed)
Medication Instructions:  INCREASE amlodipine to 54m twice daily   *If you need a refill on your cardiac medications before your next appointment, please call your pharmacy*   Lab Work: CBC, BMET, LPa today -- pre-cardioversion labs   If you have labs (blood work) drawn today and your tests are completely normal, you will receive your results only by: [place](if you have MyChart) OR A paper copy in the mail If you have any lab test that is abnormal or we need to change your treatment, we will call you to review the results.   Testing[facility]Procedures[facility] MPhineas Inches  Follow-Up: [facility] you and your health needs are our priority.  As part of our continuing mission to provide you with exceptional heart care, we have created designated Provider Care Teams.  These Care Teams include your primary Cardiologist (physician) and Advanced Practice Providers (APPs -  Physician Assistants and Nurse Practitioners) who all work together to provide you with the care you need, when you need it.  We recommend signing up for the patient portal called "MyChart".  Sign up information is provided on this After Visit Summary.  MyChart is used to connect with patients for Virtual Visits (Telemedicine).  Patients are able to view lab/test results, encounter notes, upcoming appointments, etc.  Non-urgent messages can be sent to your provider as well.   To learn more about what you can do with MyChart, go to [email or web address]    Your next appointment:    End of January 2024 with Dr. HDebara Pickettor PA/NP for cardioversion follow up  Other Instructions      Dear DMARICSA SAMMONS You are scheduled for a Cardioversion on Wednesday, January 3 with Dr. MPhineas Inches  [facility] [address] [address]at 11:45am.   DIET:  Nothing to eat or drink after  midnight except a sip of water with medications (see medication instructions below)  MEDICATION INSTRUCTIONS: HOLD: Semaglutide (Ozempic, Rybelsus, Wegovy) for 7 days Continue taking your anticoagulant (blood thinner): Apixaban (Eliquis).  You will need to continue this after your procedure until you are told by your provider that it is safe to stop.    LABS: CBC and BMET today 12/21 at Dr. HLysbeth Penneroffice  FYI:  For your safety, and to allow [place] monitor your vital signs accurately during the surgery/procedure we request: If you have artificial nails, gel coating, SNS etc, please have those removed prior to your surgery/procedure. Not having the nail coverings /polish removed may result in cancellation or delay of your surgery/procedure.  You must have a responsible person to drive you home and stay in the waiting area during your procedure. Failure to do so could result in cancellation.  Bring your insurance cards.  *Special Note: Every effort is made to have your procedure done on time. Occasionally there are emergencies that occur at the hospital that may cause delays. Please be patient if a delay does occur.

## 2022-07-14 LAB — BASIC METABOLIC PANEL
BUN/Creatinine Ratio: 34 — ABNORMAL HIGH (ref 12–28)
BUN: 23 mg/dL (ref 8–27)
CO2: 24 mmol/L (ref 20–29)
Calcium: 9.6 mg/dL (ref 8.7–10.3)
Chloride: 105 mmol/L (ref 96–106)
Creatinine, Ser: 0.67 mg/dL (ref 0.57–1.00)
Glucose: 95 mg/dL (ref 70–99)
Potassium: 4.5 mmol/L (ref 3.5–5.2)
Sodium: 145 mmol/L — ABNORMAL HIGH (ref 134–144)
eGFR: 94 mL/min/{1.73_m2} (ref >59)

## 2022-07-14 LAB — CBC
Hematocrit: 43.9 % (ref 34.0–46.6)
Hemoglobin: 14.4 g/dL (ref 11.1–15.9)
MCH: 27.6 pg (ref 26.6–33.0)
MCHC: 32.8 g/dL (ref 31.5–35.7)
MCV: 84 fL (ref 79–97)
Platelets: 323 10*3/uL (ref 150–450)
RBC: 5.21 x10E6/uL (ref 3.77–5.28)
RDW: 13.6 % (ref 11.7–15.4)
WBC: 7.2 10*3/uL (ref 3.4–10.8)

## 2022-07-14 LAB — LIPOPROTEIN A (LPA): Lipoprotein (a): 158.1 nmol/L — ABNORMAL HIGH (ref <75.0)

## 2022-07-26 ENCOUNTER — Encounter (HOSPITAL_COMMUNITY)
Admission: RE | Disposition: A | Discharge status: Home / Self Care | Payer: Medicare PPO | Source: Ambulatory Visit | Attending: Internal Medicine

## 2022-07-26 ENCOUNTER — Ambulatory Visit (HOSPITAL_COMMUNITY): Payer: Medicare PPO | Admitting: Anesthesiology

## 2022-07-26 ENCOUNTER — Encounter (HOSPITAL_COMMUNITY): Payer: Self-pay | Admitting: Internal Medicine

## 2022-07-26 ENCOUNTER — Ambulatory Visit (HOSPITAL_COMMUNITY)
Admission: RE | Admit: 2022-07-26 | Discharge: 2022-07-26 | Disposition: A | Discharge status: Home / Self Care | Payer: Medicare PPO | Source: Ambulatory Visit | Attending: Internal Medicine | Admitting: Internal Medicine

## 2022-07-26 ENCOUNTER — Ambulatory Visit (HOSPITAL_BASED_OUTPATIENT_CLINIC_OR_DEPARTMENT_OTHER): Payer: Medicare PPO | Admitting: Anesthesiology

## 2022-07-26 ENCOUNTER — Other Ambulatory Visit: Payer: Self-pay

## 2022-07-26 DIAGNOSIS — I4892 Unspecified atrial flutter: Secondary | ICD-10-CM | POA: Diagnosis not present

## 2022-07-26 DIAGNOSIS — Z7901 Long term (current) use of anticoagulants: Secondary | ICD-10-CM | POA: Insufficient documentation

## 2022-07-26 DIAGNOSIS — I10 Essential (primary) hypertension: Secondary | ICD-10-CM | POA: Diagnosis not present

## 2022-07-26 DIAGNOSIS — Z6836 Body mass index (BMI) 36.0-36.9, adult: Secondary | ICD-10-CM | POA: Diagnosis not present

## 2022-07-26 DIAGNOSIS — I480 Paroxysmal atrial fibrillation: Secondary | ICD-10-CM | POA: Diagnosis present

## 2022-07-26 DIAGNOSIS — E7800 Pure hypercholesterolemia, unspecified: Secondary | ICD-10-CM | POA: Diagnosis not present

## 2022-07-26 DIAGNOSIS — I2510 Atherosclerotic heart disease of native coronary artery without angina pectoris: Secondary | ICD-10-CM | POA: Diagnosis not present

## 2022-07-26 DIAGNOSIS — K760 Fatty (change of) liver, not elsewhere classified: Secondary | ICD-10-CM | POA: Diagnosis not present

## 2022-07-26 DIAGNOSIS — K219 Gastro-esophageal reflux disease without esophagitis: Secondary | ICD-10-CM | POA: Diagnosis not present

## 2022-07-26 DIAGNOSIS — J45909 Unspecified asthma, uncomplicated: Secondary | ICD-10-CM | POA: Diagnosis not present

## 2022-07-26 DIAGNOSIS — E039 Hypothyroidism, unspecified: Secondary | ICD-10-CM | POA: Diagnosis not present

## 2022-07-26 DIAGNOSIS — Z86711 Personal history of pulmonary embolism: Secondary | ICD-10-CM | POA: Insufficient documentation

## 2022-07-26 DIAGNOSIS — Z79899 Other long term (current) drug therapy: Secondary | ICD-10-CM | POA: Insufficient documentation

## 2022-07-26 DIAGNOSIS — E1151 Type 2 diabetes mellitus with diabetic peripheral angiopathy without gangrene: Secondary | ICD-10-CM

## 2022-07-26 DIAGNOSIS — E669 Obesity, unspecified: Secondary | ICD-10-CM | POA: Insufficient documentation

## 2022-07-26 DIAGNOSIS — Z8249 Family history of ischemic heart disease and other diseases of the circulatory system: Secondary | ICD-10-CM | POA: Diagnosis not present

## 2022-07-26 DIAGNOSIS — I4891 Unspecified atrial fibrillation: Secondary | ICD-10-CM

## 2022-07-26 HISTORY — PX: CARDIOVERSION: SHX1299

## 2022-07-26 LAB — POCT I-STAT, CHEM 8
BUN: 23 mg/dL (ref 8–23)
Calcium, Ion: 1.12 mmol/L — ABNORMAL LOW (ref 1.15–1.40)
Chloride: 110 mmol/L (ref 98–111)
Creatinine, Ser: 0.7 mg/dL (ref 0.44–1.00)
Glucose, Bld: 122 mg/dL — ABNORMAL HIGH (ref 70–99)
HCT: 39 % (ref 36.0–46.0)
Hemoglobin: 13.3 g/dL (ref 12.0–15.0)
Potassium: 4.2 mmol/L (ref 3.5–5.1)
Sodium: 143 mmol/L (ref 135–145)
TCO2: 25 mmol/L (ref 22–32)

## 2022-07-26 SURGERY — CARDIOVERSION
Anesthesia: General

## 2022-07-26 MED ORDER — LACTATED RINGERS IV SOLN: Freq: Once | INTRAVENOUS | Status: DC

## 2022-07-26 MED ORDER — LIDOCAINE 2% (20 MG/ML) 5 ML SYRINGE
INTRAMUSCULAR | Status: DC | PRN
  Administered 2022-07-26: 40 mg via INTRAVENOUS

## 2022-07-26 MED ORDER — SODIUM CHLORIDE 0.9 % IV SOLN: INTRAVENOUS | Status: DC

## 2022-07-26 MED ORDER — PROPOFOL 10 MG/ML IV BOLUS
INTRAVENOUS | Status: DC | PRN
  Administered 2022-07-26: 60 mg via INTRAVENOUS

## 2022-07-26 NOTE — Anesthesia Preprocedure Evaluation (Addendum)
Anesthesia Evaluation  Patient identified by MRN, date of birth, ID band Patient awake    Reviewed: Allergy & Precautions, NPO status , Patient's Chart, lab work & pertinent test results  Airway Mallampati: II  TM Distance: >3 FB Neck ROM: Full    Dental  (+) Teeth Intact   Pulmonary asthma    breath sounds clear to auscultation       Cardiovascular hypertension, + Peripheral Vascular Disease   Rhythm:Irregular Rate:Abnormal  Echo:  1. Left ventricular ejection fraction, by estimation, is 55 to 60%. Left  ventricular ejection fraction by 3D volume is 59 %. The left ventricle has  normal function. The left ventricle has no regional wall motion  abnormalities. Left ventricular diastolic   parameters were normal.   2. Right ventricular systolic function is normal. The right ventricular  size is normal. There is normal pulmonary artery systolic pressure. The  estimated right ventricular systolic pressure is 44.6 mmHg.   3. Left atrial size was mildly dilated.   4. The mitral valve is normal in structure. Mild mitral valve  regurgitation. No evidence of mitral stenosis.   5. The aortic valve was not well visualized. Aortic valve regurgitation  is not visualized. Mild aortic valve stenosis. Vmax 2.0 m/s, MG 9 mmHg,  AVA 1.7 cm^2, DI 0.54   6. The inferior vena cava is normal in size with greater than 50%  respiratory variability, suggesting right atrial pressure of 3 mmHg.     Neuro/Psych  Headaches  Anxiety        GI/Hepatic Neg liver ROS,GERD  ,,  Endo/Other  diabetesHypothyroidism    Renal/GU negative Renal ROS     Musculoskeletal  (+) Arthritis ,    Abdominal   Peds  Hematology negative hematology ROS (+)   Anesthesia Other Findings   Reproductive/Obstetrics                             Anesthesia Physical Anesthesia Plan  ASA: 3  Anesthesia Plan: General   Post-op Pain  Management:    Induction: Intravenous  PONV Risk Score and Plan: 0  Airway Management Planned: Simple Face Mask and Natural Airway  Additional Equipment: None  Intra-op Plan:   Post-operative Plan:   Informed Consent: I have reviewed the patients History and Physical, chart, labs and discussed the procedure including the risks, benefits and alternatives for the proposed anesthesia with the patient or authorized representative who has indicated his/her understanding and acceptance.       Plan Discussed with: CRNA  Anesthesia Plan Comments:         Anesthesia Quick Evaluation

## 2022-07-26 NOTE — Interval H&P Note (Signed)
History and Physical Interval Note:  07/26/2022 11:16 AM  Alyssa [name]  has presented today for surgery, with the diagnosis of aflutter.  The various methods of treatment have been discussed with the patient and family. After consideration of risks, benefits and other options for treatment, the patient has consented to  Procedure(s): CARDIOVERSION (N/A) as a surgical intervention.  The patient's history has been reviewed, patient examined, no change in status, stable for surgery.  I have reviewed the patient's chart and labs.  Questions were answered to the patient's satisfaction.     Janina Mayo

## 2022-07-26 NOTE — Anesthesia Postprocedure Evaluation (Signed)
Anesthesia Post Note  Patient: Alyssa [name]  Procedure(s) Performed: CARDIOVERSION     Patient location during evaluation: PACU Anesthesia Type: General Level of consciousness: awake and alert Pain management: pain level controlled Vital Signs Assessment: post-procedure vital signs reviewed and stable Respiratory status: spontaneous breathing, nonlabored ventilation, respiratory function stable and patient connected to nasal cannula oxygen Cardiovascular status: blood pressure returned to baseline and stable Postop Assessment: no apparent nausea or vomiting Anesthetic complications: no   No notable events documented.  Last Vitals:  Vitals:   07/26/22 1239 07/26/22 1240  BP:  133/82  Pulse: 92 90  Resp: (!) 23 17  Temp:    SpO2: 94% 95%    Last Pain:  Vitals:   07/26/22 1240  TempSrc:   PainSc: 0-No pain                 Effie Berkshire

## 2022-07-26 NOTE — Transfer of Care (Signed)
Immediate Anesthesia Transfer of Care Note  Patient: Alyssa [name]  Procedure(s) Performed: CARDIOVERSION  Patient Location: Endoscopy Unit  Anesthesia Type:General  Level of Consciousness: awake, alert , and oriented  Airway & Oxygen Therapy: Patient Spontanous Breathing  Post-op Assessment: Report given to RN, Post -op Vital signs reviewed and stable, and Patient moving all extremities  Post vital signs: Reviewed and stable  Last Vitals:  Vitals Value Taken Time  BP    Temp    Pulse    Resp    SpO2      Last Pain:  Vitals:   07/26/22 1101  TempSrc: Tympanic  PainSc: 0-No pain         Complications: No notable events documented.

## 2022-07-26 NOTE — Discharge Instructions (Signed)

## 2022-07-26 NOTE — Anesthesia Procedure Notes (Signed)
Procedure Name: General with mask airway Date/Time: 07/26/2022 12:09 PM  Performed by: Leonor Liv, CRNAPre-anesthesia Checklist: Patient identified, Emergency Drugs available, Suction available and Patient being monitored Patient Re-evaluated:Patient Re-evaluated prior to induction Oxygen Delivery Method: Ambu bag Induction Type: IV induction Dental Injury: Teeth and Oropharynx as per pre-operative assessment

## 2022-07-26 NOTE — CV Procedure (Signed)
Procedure: Electrical Cardioversion Indications:  Atrial Fibrillation  Procedure Details:  Consent: Risks of procedure as well as the alternatives and risks of each were explained to the (patient/caregiver).  Consent for procedure obtained.  Time Out: Verified patient identification, verified procedure, site/side was marked, verified correct patient position, special equipment/implants available, medications/allergies/relevent history reviewed, required imaging and test results available. PERFORMED.  Patient placed on cardiac monitor, pulse oximetry, supplemental oxygen as necessary.  Sedation given:  propofol 60 mg daily, lidocaine 40 mg daily Pacer pads placed anterior and posterior chest.  Cardioverted 1 time(s).  Cardioversion with synchronized biphasic 200J shock.  Evaluation: Findings: Post procedure EKG shows: NSR with PACs Complications: None Patient did tolerate procedure well.  Time Spent Directly with the Patient:  15 minutes   Janina Mayo 07/26/2022, 12:19 PM

## 2022-07-30 ENCOUNTER — Encounter (HOSPITAL_COMMUNITY): Payer: Self-pay | Admitting: Internal Medicine

## 2022-08-04 ENCOUNTER — Encounter: Payer: Self-pay | Admitting: Internal Medicine

## 2022-08-14 NOTE — Progress Notes (Unsigned)
Cardiology Clinic Note   Patient Name: Alyssa [name] Date of Encounter: 08/17/2022  Primary Care Provider:  Nickola Major, MD Primary Cardiologist:  Pixie Casino, MD  Patient Profile    Alyssa [name] is a 72 y.o. female with a past medical history of PAF/aflutter s/p cardioversion January 2024 on anticoagulation, hypertension, hyperlipidemia, PE 2010, breast CA who presents to the clinic today for follow-up of cardioversion.   Past Medical History    Past Medical History:  Diagnosis Date   Abdominal pain    Anxiety    Arthritis    Asthma    Breast cancer ([place]) 06/2013   left upper inner   Cancer ([place])    breast   Diabetes mellitus without complication ([place])    Takes Tradjenta   Family history of adverse reaction to anesthesia    Patients mother had to be resusciated after having anesthesia   GERD (gastroesophageal reflux disease)    Headache(784.0)    Takes Depakote   Hernia, incisional periumbilical, incarcerated 12/09/8414   Hot flashes    Hx of radiation therapy 09/09/13- 10/13/13   left breast 5000 cGy in 25 sessions, no boost   Hyperlipidemia    Hypertension    PCP DR Nolon Rod   Hypothyroidism    Non-alcoholic fatty liver disease    Peripheral vascular disease ([place])    pulmonary embolis-still have some   Post concussion syndrome    4 yrs ago- sees [facility]- Dr Leonie Man every 6 months-   has sleep study 2011 following accident but was neg per patient-  short term memory issues, dates and times   Pulmonary embolism ([place]) 2013   Recurrent upper respiratory infection (URI)    FLU 08/23/11-08/29/11 then URI 08/29/11- 09/08/11- S/P zpack and steroids-  improved      no cough or congestion   Thyroid disease    Thyroid mass, left inferior lobe, 3.6cm [id number] 10/07/2011   Ventral hernia    Past Surgical History:  Procedure Laterality Date   ANTERIOR CERVICAL DECOMP/DISCECTOMY FUSION N/A 01/16/2020   Procedure: c5-6, c6-7 ANTERIOR CERVICAL  DECOMPRESSION/DISCECTOMY FUSION, ALLOGRAFT AND PLATE;  Surgeon: Marybelle Killings, MD;  Location: [facility];  Service: Orthopedics;  Laterality: N/A;   APPENDECTOMY  2002   lap appy.  Dr. Hassell Done   BREAST BIOPSY     BREAST LUMPECTOMY Left 2015   radiation   BREAST LUMPECTOMY WITH NEEDLE LOCALIZATION AND AXILLARY SENTINEL LYMPH NODE BX Left 08/04/2013   Procedure: BREAST LUMPECTOMY WITH NEEDLE LOCALIZATION AND AXILLARY SENTINEL LYMPH NODE Biopsy x 3;  Surgeon: Rolm Bookbinder, MD;  Location: [facility];  Service: General;  Laterality: Left;   BREAST SURGERY     CARDIOVERSION N/A 07/26/2022   Procedure: CARDIOVERSION;  Surgeon: Janina Mayo, MD;  Location: [place];  Service: Cardiovascular;  Laterality: N/A;   CARPAL TUNNEL RELEASE  10/2009-12/2010   left - right   CESAREAN SECTION  X 2   COLONOSCOPY     DIAGNOSTIC LAPAROSCOPY     Lap. chole., appendectomy, hernia repair   HERNIA REPAIR  09/15/11   Ventral w/mesh   JOINT REPLACEMENT     KNEE ARTHROPLASTY Right 12/22/2015   Procedure: COMPUTER ASSISTED TOTAL KNEE ARTHROPLASTY;  Surgeon: Marybelle Killings, MD;  Location: [place];  Service: Orthopedics;  Laterality: Right;   LAPAROSCOPIC CHOLECYSTECTOMY  2006   Dr. Bubba Camp   TARSAL TUNNEL RELEASE Bilateral    THYROIDECTOMY, PARTIAL     VENTRAL HERNIA REPAIR  09/29/2011  Procedure: LAPAROSCOPIC VENTRAL HERNIA;  Surgeon: Adin Hector, MD;  Location: [facility];  Service: General;  Laterality: N/A;  Laparoscopic Ventral Wall Hernia Repair with Mesh    Allergies  Allergies  Allergen Reactions   Aleve [Naproxen Sodium] Hives, Itching and Swelling   Aromasin [Exemestane] Anaphylaxis, Itching and Swelling    Pt currently tolerating medication well (02/03/16)-gwd; Pt stated "I can take this medication for a few months at a time and I take a benadryl when I feel it starting to come on" Patient has facial swelling, itching, and throat swelling  07/12/17  Taking this right now and no problem. sy/rn   Letrozole  Shortness Of Breath, Rash and Other (See Comments)    Swelling - feet, eyes,hands;   Speech garbled   Levofloxacin Rash and Swelling    Facial swelling and red facial rash   Penicillins Hives, Swelling and Rash    All over the body. Has patient had a PCN reaction causing immediate rash, facial/tongue/throat swelling, SOB or lightheadedness with hypotension: Yes Has patient had a PCN reaction causing severe rash involving mucus membranes or skin necrosis: No Has patient had a PCN reaction that required hospitalization No Has patient had a PCN reaction occurring within the last 10 years: No If all of the above answers are "NO", then may proceed with Cephalosporin use.    Symbicort [Budesonide-Formoterol Fumarate] Anaphylaxis, Hives, Swelling and Rash   Codeine Other (See Comments)    hallucinations   Percocet [Oxycodone-Acetaminophen] Other (See Comments)    Causes syncope day after medication has been taken.   Vicodin [Hydrocodone-Acetaminophen] Other (See Comments)    Causes syncope day after medication has been taken.   Doxycycline Swelling    Must with  antihistamine   Ezetimibe Other (See Comments)    Myalgias,fatigue   Prednisone Other (See Comments)    Can take with antihistamine/ can take the shot without antihistamine   Cefdinir Rash and Swelling   Losartan Rash    History of Present Illness    Alyssa [name] has a past medical history of: PAF/Aflutter. Onset June 2023, started on anticoagulation.  Cardioversion 07/26/2022.  Echo 04/25/2022: EF 55-60%. Mild LAE. Mild MR.  Hypertension.  Hyperlipidemia.  Lipid panel 12/22/2021: LDL 41, HDL 54, TG 104, total 114.  LPa 07/13/2022: 158.1.  PE 2010.  Breast CA.   Alyssa [name] was first evaluated by Dr. Debara Pickett on 06/14/2018 for dyslipidemia at the request of Emi Belfast. At an office visit with Dr. Debara Pickett on 01/04/2022 patient noted occasional palpitations with unexplained increase in heart rate. Apple Watch suggested afib.  Apple Watch EKGs were evaluated and confirmed Afib. Patient was started on anticoagulation. Patient was last seen in the office on 07/13/2022 by Dr. Debara Pickett. At that time she was noted to be hypertensive and EKG showed new onset aflutter with variable ventricular response. She had no cardiac awareness of dysrhythmia but did admit to increased fatigue. She has been compliant with anticoagulation since June. She underwent successful cardioversion on 07/26/2022.   Today, patient is doing well since cardioversion. She reports she had one episode of feeling as though her heart was racing about one week ago. She reports her Apple watch did not show her rhythm as afib. This episode lasted about 30 minutes and resolved on its own. She reports DOE is improving. She is getting back to her normal activities at home with housework and walking around with her new puppy. She is also scheduled to start work outs with  her personal trainer starting tomorrow. No chest pain, pressure, or tightness. Denies lower extremity edema, orthopnea, or PND.    Home Medications    Current Meds  Medication Sig   albuterol (VENTOLIN HFA) 108 (90 Base) MCG/ACT inhaler Inhale 2 puffs into the lungs every 6 (six) hours as needed for wheezing or shortness of breath.    amLODipine (NORVASC) 5 MG tablet Take 1 tablet (5 mg total) by mouth in the morning AND 1 tablet (5 mg total) every evening.   amphetamine-dextroamphetamine (ADDERALL) 20 MG tablet Take 20 mg by mouth 2 (two) times daily. Take '20mg'$  in the morning and '20mg'$  in the afternoon.   apixaban (ELIQUIS) 5 MG TABS tablet Take 1 tablet (5 mg total) by mouth 2 (two) times daily.   cetirizine (ZYRTEC) 10 MG tablet Take 10 mg by mouth daily.   cholecalciferol (VITAMIN D3) 25 MCG (1000 UNIT) tablet Take 1,000 Units by mouth daily.   fluticasone (FLOVENT HFA) 110 MCG/ACT inhaler Inhale 2 puffs into the lungs daily. (Patient taking differently: Inhale 2 puffs into the lungs in the morning and  at bedtime.)   levothyroxine (SYNTHROID) 112 MCG tablet Take 112 mcg by mouth daily before breakfast.   metoprolol succinate (TOPROL-XL) 25 MG 24 hr tablet TAKE 1/2 TABLET(12.5 MG) BY MOUTH DAILY. MAY TAKE EXTRA 1/2 TABLET FOR SUSTAINED HEART RATE>100 FOR 45 MINUTES   omeprazole (PRILOSEC) 40 MG capsule TAKE 1 CAPSULE BY MOUTH DAILY   potassium chloride (KLOR-CON) 10 MEQ tablet TAKE 1 TABLET(10 MEQ) BY MOUTH DAILY   REPATHA SURECLICK 202 MG/ML SOAJ INJECT 1 PEN INTO THE SKIN EVERY 14 DAYS   Semaglutide, 2 MG/DOSE, (OZEMPIC, 2 MG/DOSE,) 8 MG/3ML SOPN Inject 2 mg into the skin every Sunday.   vitamin E 180 MG (400 UNITS) capsule Take 400 Units by mouth daily.   Current Facility-Administered Medications for the 08/17/22 encounter (Office Visit) with Mayra Reel, NP  Medication   fluticasone (FLOVENT HFA) 44 MCG/ACT inhaler 2 puff    Family History    Family History  Problem Relation Age of Onset   Malignant hyperthermia Mother    Heart attack Father    Stroke Father    Diabetes Other        Grandmother   Other Other        respiratory- Grandmother   Cancer Maternal Grandmother 51       breast   Breast cancer Maternal Grandmother    She indicated that her mother is alive. She indicated that her father is deceased. She indicated that her brother is alive. She indicated that the status of her maternal grandmother is unknown. She indicated that the status of her other is unknown.   Social History    Social History   Socioeconomic History   Marital status: Divorced    Spouse name: Not on file   Number of children: 2   Years of education: Master's   Highest education level: Not on file  Occupational History    Employer: [employer]  Tobacco Use   Smoking status: Never   Smokeless tobacco: Never  Vaping Use   Vaping Use: Never used  Substance and Sexual Activity   Alcohol use: No    Alcohol/week: 1.0 standard drink of alcohol    Types: 1 Glasses of wine per  week   Drug use: No   Sexual activity: Yes    Comment: Menarche age 53, first live birth age 53,  [place] P2. She stopped having periods  approximately 1999 and took HRT until 2005.  Other Topics Concern   Not on file  Social History Narrative   Patient is divorced and lives alone.   Patient drinks 3-4 cups of caffeine daily.    Patient is right handed.   Social Determinants of Health   Financial Resource Strain: Not on file  Food Insecurity: Not on file  Transportation Needs: Not on file  Physical Activity: Not on file  Stress: Not on file  Social Connections: Not on file  Intimate Partner Violence: Not on file     Review of Systems    General:  No chills, fever, night sweats or weight changes.  Cardiovascular:  No chest pain, dyspnea on exertion, edema, orthopnea, palpitations, paroxysmal nocturnal dyspnea. Dermatological: No rash, lesions/masses Respiratory: No cough, dyspnea Urologic: No hematuria, dysuria Abdominal:   No nausea, vomiting, diarrhea, bright red blood per rectum, melena, or hematemesis Neurologic:  No visual changes, weakness, changes in mental status. All other systems reviewed and are otherwise negative except as noted above.  Physical Exam    VS:  BP 126/80 (BP Location: Left Arm, Patient Position: Sitting, Cuff Size: Large)   Pulse 80   Ht '5\' 2"'$  (1.575 m)   Wt 201 lb 6.4 oz (91.4 kg)   BMI 36.84 kg/m  , BMI Body mass index is 36.84 kg/m. GEN:  Well nourished, well developed, in no acute distress. HEENT: Normal. Neck: Supple, no JVD, carotid bruits, or masses. Cardiac: RRR, no murmurs, rubs, or gallops. No clubbing, cyanosis, edema.  Radials/DP/PT 2+ and equal bilaterally.  Respiratory:  Respirations regular and unlabored, clear to auscultation bilaterally. GI: Soft, nontender, nondistended. MS: No deformity or atrophy. Skin: Warm and dry, no rash. Neuro: Strength and sensation are intact. Psych: Normal affect.  Accessory Clinical Findings     Recent Labs: 04/20/2022: B Natriuretic Peptide 90.4 07/13/2022: Platelets 323 07/26/2022: BUN 23; Creatinine, Ser 0.70; Hemoglobin 13.3; Potassium 4.2; Sodium 143   Recent Lipid Panel    Component Value Date/Time   CHOL 114 12/22/2021 1015   TRIG 104 12/22/2021 1015   HDL 54 12/22/2021 1015   CHOLHDL 2.1 12/22/2021 1015   LDLCALC 41 12/22/2021 1015        ECG personally reviewed by me today: Sinus with PACs. HR 80.     CHA2DS2-VASc Score = 3   This indicates a 3.2% annual risk of stroke. The patient's score is based upon: CHF History: 0 HTN History: 1 Diabetes History: 0 Stroke History: 0 Vascular Disease History: 0 Age Score: 1 Gender Score: 1      Assessment & Plan   PAF/Aflutter. S/p cardioversion January 2024. Patient is maintaining sinus today. EKG today shows sinus rhythm with PACs, rate 80. Patient reports one episode of racing heart a week ago that was not reported as afib by her Apple watch otherwise no palpitations. Continue metoprolol and Eliquis. Appropriate Eliquis dose; patient does not meet age, weight, or kidney function criteria for dose adjustment.  Hypertension. BP today 126/80. Patient denies headaches or dizziness. Continue amlodipine and Toprol.  Hyperlipidemia. LDL June 2023 41, at goal. [place].      Disposition: Follow-up with Dr. Debara Pickett in 3 months or sooner as needed.    Justice Britain. Rupinder Livingston, DNP, NP-C     08/17/2022, 12:17 PM [address] Office [phone] Fax [phone]

## 2022-08-17 ENCOUNTER — Encounter: Payer: Self-pay | Admitting: Student

## 2022-08-17 ENCOUNTER — Ambulatory Visit: Payer: Medicare PPO | Attending: General Practice | Admitting: Student

## 2022-08-17 VITALS — BP 126/80 | HR 80 | Ht 62.0 in | Wt 201.4 lb

## 2022-08-17 DIAGNOSIS — E782 Mixed hyperlipidemia: Secondary | ICD-10-CM

## 2022-08-17 DIAGNOSIS — I480 Paroxysmal atrial fibrillation: Secondary | ICD-10-CM | POA: Diagnosis not present

## 2022-08-17 DIAGNOSIS — I10 Essential (primary) hypertension: Secondary | ICD-10-CM

## 2022-08-17 NOTE — Patient Instructions (Signed)
Medication Instructions:  The current medical regimen is effective;  continue present plan and medications as directed. Please refer to the Current Medication list given to you today.  *If you need a refill on your cardiac medications before your next appointment, please call your pharmacy*  Lab Work: NONE If you have labs (blood work) drawn today and your tests are completely normal, you will receive your results only by: [place] (if you have MyChart) OR A paper copy in the mail If you have any lab test that is abnormal or we need to change your treatment, we will call you to review the results.  Testing/Procedures: NONE  Follow-Up: At [facility], you and your health needs are our priority.  As part of our continuing mission to provide you with exceptional heart care, we have created designated Provider Care Teams.  These Care Teams include your primary Cardiologist (physician) and Advanced Practice Providers (APPs -  Physician Assistants and Nurse Practitioners) who all work together to provide you with the care you need, when you need it.  Your next appointment:   3 month(s)  Provider:   Pixie Casino, MD     Other Instructions

## 2022-08-18 ENCOUNTER — Ambulatory Visit: Payer: Medicare PPO | Admitting: Internal Medicine

## 2022-08-22 ENCOUNTER — Ambulatory Visit: Payer: Medicare PPO | Admitting: General Practice

## 2022-10-02 ENCOUNTER — Other Ambulatory Visit: Payer: Self-pay | Admitting: Internal Medicine

## 2022-10-02 ENCOUNTER — Encounter: Payer: Self-pay | Admitting: Internal Medicine

## 2022-10-02 DIAGNOSIS — E7849 Other hyperlipidemia: Secondary | ICD-10-CM

## 2022-10-02 MED ORDER — REPATHA SURECLICK 140 MG/ML ~~LOC~~ SOAJ: SUBCUTANEOUS | 3 refills | Status: DC

## 2022-10-18 ENCOUNTER — Ambulatory Visit: Payer: Medicare PPO | Attending: Student | Admitting: Student

## 2022-10-18 ENCOUNTER — Encounter: Payer: Self-pay | Admitting: Student

## 2022-10-18 ENCOUNTER — Telehealth: Payer: Self-pay | Admitting: Internal Medicine

## 2022-10-18 VITALS — BP 127/77 | HR 71 | Ht 62.0 in | Wt 204.0 lb

## 2022-10-18 DIAGNOSIS — I480 Paroxysmal atrial fibrillation: Secondary | ICD-10-CM

## 2022-10-18 DIAGNOSIS — I10 Essential (primary) hypertension: Secondary | ICD-10-CM | POA: Diagnosis not present

## 2022-10-18 NOTE — Telephone Encounter (Signed)
  Per MyChart Scheduling message:  Pt c/o Shortness Of Breath: STAT if SOB developed within the last 24 hours or pt is noticeably SOB on the phone  1. Are you currently SOB (can you hear that pt is SOB on the phone)?   2. How long have you been experiencing SOB?   3. Are you SOB when sitting or when up moving around?   4. Are you currently experiencing any other symptoms?   Shortness of breath without exertion and sitting; fatigue, dizzy; started having  incidents 1/30,2/26,3/14,3/19,3/20,3/25,3/26 I track info with my iPhone. A few Afib but these dates are sinus rhytm I had electric cardio conversion  BP is still 145/85

## 2022-10-18 NOTE — Telephone Encounter (Signed)
Patient states increase in SOB and A-Fib in March.  Starting after cardio version in January.  She states  1 episode of Afib in January and one in February but 5 episodes in March.  She also has episodes of SOB where they will last up to 45 minutes before she feels she can breath normal.  Then as soon as she is active, even walking to bathroom, it will start again and just last for the whole day.  She states these are the same amount as A-Fib episodes each month but not the same days.  She has had 5 episodes of SOB in March and states these days she was in [place].  She is taking her metoprolol 1/2 tablet twice a day. And amlodipine 5mg  BID  BP average is 145/85, with none less than Systolic [id number] and not greater than 90 systolic.  Heart Rate averages 57-67 but will have times it is "erratic" and go from 60 to 120 at times.  She has some dizziness, and headache.  She states always tired. She has an appt. April 15 at 1:30 but wonders if she should be seen sooner since this is starting to occur more often.

## 2022-10-18 NOTE — Progress Notes (Signed)
Cardiology Clinic Note   Date: 10/18/2022 ID: Edell, Deleon Jun 29, 1951, MRN [id number]  Primary Cardiologist:  Pixie Casino, MD  Patient Profile    Alyssa [name] is a 72 y.o. female who presents to the clinic today for increased episodes of PAF s/p cardioversion.  Past medical history significant for: PAF/Aflutter. Onset June 2023, started on anticoagulation.  Cardioversion 07/26/2022.  Echo 04/25/2022: EF 55-60%. Mild LAE. Mild MR.  Hypertension.  Hyperlipidemia.  Lipid panel 12/22/2021: LDL 41, HDL 54, TG 104, total 114.  LPa 07/13/2022: 158.1.  PE 2010.  Breast CA.    History of Present Illness    Alyssa [name] was first evaluated by Dr. Debara Pickett on 06/14/2018 for dyslipidemia at the request of Emi Belfast. At an office visit with Dr. Debara Pickett on 01/04/2022 patient noted occasional palpitations with unexplained increase in heart rate. Apple Watch suggested afib. Apple Watch EKGs were evaluated and confirmed Afib. Patient was started on anticoagulation. Patient was last seen in the office on 07/13/2022 by Dr. Debara Pickett. At that time she was noted to be hypertensive and EKG showed new onset aflutter with variable ventricular response. She had no cardiac awareness of dysrhythmia but did admit to increased fatigue. She has been compliant with anticoagulation since June. She underwent successful cardioversion on 07/26/2022. She was last seen in the office by me on 08/17/2022 post cardioversion. At that time she reported one 30 minute episode of heart racing that was not detected as afib per Apple watch. She reported improving DOE and getting back to normal activities.   More recently, patient contacted the office today with complaints of increased episodes of afib since cardioversion. Per Triage "Patient states increase in SOB and A-Fib in March.  Starting after cardio version in January.  She states  1 episode of Afib in January and one in February but 5 episodes in March.  She also has  episodes of SOB where they will last up to 45 minutes before she feels she can breath normal.  Then as soon as she is active, even walking to bathroom, it will start again and just last for the whole day.  She states these are the same amount as A-Fib episodes each month but not the same days.  She has had 5 episodes of SOB in March and states these days she was in [place].  She is taking her metoprolol 1/2 tablet twice a day. And amlodipine 5mg  BID  BP average is 145/85, with none less than Systolic [id number] and not greater than 90 systolic.  Heart Rate averages 57-67 but will have times it is "erratic" and go from 60 to 120 at times.  She has some dizziness, and headache.  She states always tired. She has an appt. April 15 at 1:30 but wonders if she should be seen sooner since this is starting to occur more often."    Today, patient is added to the schedule today for complaints of episodic afib as described above. Patient wears an Apple watch and has noticed episodes begin when she is exerting herself. Exertion can be as simple as making the bed when the episode starts. She feels her heart rate increase and becomes dyspneic. If she rests the episode will resolve but as soon as she gets up again, even to walk across the room to the bathroom, increased heart rate and dyspnea will return. These episodes will last all day. She does report a few of the episodes occur when she is  at rest and she can watch her heart rate increase from 50-60 to >120. Dates of these episodes: 1/30, 2/26, 3/14, 3/19, 3/20, 3/25, 3/26.  Her Apple Watch again she is in A-fib during these episodes.  A couple of times she has taken an extra dose of metoprolol and that has broken the episode for the rest of the day.  She has not done this every day as she was concerned it would be too much medicine.  Her heart rate will occasionally get down in the 50s and she does have some dizziness and fatigue.  She is unsure which days she took the extra dose of  metoprolol so she cannot correlate her symptoms of dizziness and fatigue with those days.  She is otherwise doing okay.  She reports on all of the other days not mentioned above she can do her normal activities without any difficulty.    ROS: All other systems reviewed and are otherwise negative except as noted in History of Present Illness.  Studies Reviewed    ECG personally reviewed by me today: NSR, 71 bpm.  No significant changes from 08/17/2022.  No ectopy today.  Risk Assessment/Calculations     CHA2DS2-VASc Score = 3   This indicates a 3.2% annual risk of stroke. The patient's score is based upon: CHF History: 0 HTN History: 1 Diabetes History: 0 Stroke History: 0 Vascular Disease History: 0 Age Score: 1 Gender Score: 1             Physical Exam    VS:  BP 127/77 (BP Location: Right Arm, Patient Position: Sitting, Cuff Size: Normal)   Pulse 71   Ht 5\' 2"  (1.575 m)   Wt 204 lb (92.5 kg)   BMI 37.31 kg/m  , BMI Body mass index is 37.31 kg/m.  GEN: Well nourished, well developed, in no acute distress. Neck: No JVD or carotid bruits. Cardiac:  RRR. No murmurs. No rubs or gallops.   Respiratory:  Respirations regular and unlabored. Clear to auscultation without rales, wheezing or rhonchi. Extremities: Radials/DP/PT 2+ and equal bilaterally. No clubbing or cyanosis. No edema.  Skin: Warm and dry, no rash.  Assessment & Plan   PAF/Aflutter. S/p cardioversion January 2024. Patient reports several days since cardioversion of continual episodes of A-fib that began when she is exerting herself and resolves when she rests only to immediately return with any level of exertion.  This will occur throughout the day.  Dates of these episodes include: 1/30, 2/26, 3/14, 3/19, 3/20, 3/25, 3/26.  Apple watch indicates A-fib during these episodes.  She reports associated shortness of breath and fatigue with these episodes.  On the days outside of the states she feels good and can do  her normal activities.  She is in normal sinus rhythm today, rate 71 bpm.  She took an extra dose of metoprolol couple of times during these episodes and they will stop them for the day.  Patient is instructed to continue monitoring episodes as she has been with her Apple Watch.  She is instructed to take the extra dose of metoprolol when the episode begins.  She will contact the office if she has sustained heart rate >120 for more than an hour or if extra dose of metoprolol does not break episode.  Discussed case with DOD, Dr. Harl Bowie, who agrees with plan.  Continue metoprolol with breakthrough as needed and Eliquis.  May consider referral to EP.  Appropriate Eliquis dose.  Hypertension. BP today 127/77. Patient denies  current headaches or dizziness. Continue amlodipine and Toprol.   Disposition: Patient is instructed to take extra dose of metoprolol 12.5 mg for episodes of increased heart rate and/or A-fib.  Patient will contact the office if sustained heart rate >120 for more than an hour or extra dose of metoprolol does not break episode.  Return to see Dr. Debara Pickett as previously scheduled or sooner as needed.         Signed, Justice Britain. Ameena Vesey, DNP, NP-C

## 2022-10-18 NOTE — Telephone Encounter (Signed)
Spoke with patient of Dr. Debara Pickett. She is aware MD is OOO this week. Scheduled her for visit with Neoma Laming NP today at 3pm. Patient saw Neoma Laming 07/2022.

## 2022-10-18 NOTE — Patient Instructions (Signed)
Medication Instructions:  Your physician has recommended you make the following change in your medication:  You may take an extra tablet of metoprolol as needed for palpitations.  *If you need a refill on your cardiac medications before your next appointment, please call your pharmacy*   Lab Work: NONE If you have labs (blood work) drawn today and your tests are completely normal, you will receive your results only by: [place] (if you have MyChart) OR A paper copy in the mail If you have any lab test that is abnormal or we need to change your treatment, we will call you to review the results.   Testing/Procedures: NONE   Follow-Up: At [facility], you and your health needs are our priority.  As part of our continuing mission to provide you with exceptional heart care, we have created designated Provider Care Teams.  These Care Teams include your primary Cardiologist (physician) and Advanced Practice Providers (APPs -  Physician Assistants and Nurse Practitioners) who all work together to provide you with the care you need, when you need it.  We recommend signing up for the patient portal called "MyChart".  Sign up information is provided on this After Visit Summary.  MyChart is used to connect with patients for Virtual Visits (Telemedicine).  Patients are able to view lab/test results, encounter notes, upcoming appointments, etc.  Non-urgent messages can be sent to your provider as well.   To learn more about what you can do with MyChart, go to [email or web address].    Your next appointment:   Please keep upcoming appointment with Dr. Debara Pickett.

## 2022-10-23 ENCOUNTER — Other Ambulatory Visit: Payer: Self-pay | Admitting: Family Medicine

## 2022-10-23 DIAGNOSIS — Z0000 Encounter for general adult medical examination without abnormal findings: Secondary | ICD-10-CM

## 2022-11-06 ENCOUNTER — Encounter: Payer: Self-pay | Admitting: Internal Medicine

## 2022-11-06 ENCOUNTER — Ambulatory Visit: Payer: Medicare PPO | Attending: Internal Medicine | Admitting: Internal Medicine

## 2022-11-06 VITALS — BP 132/82 | HR 67 | Ht 62.0 in | Wt 207.4 lb

## 2022-11-06 DIAGNOSIS — R0609 Other forms of dyspnea: Secondary | ICD-10-CM

## 2022-11-06 DIAGNOSIS — I2584 Coronary atherosclerosis due to calcified coronary lesion: Secondary | ICD-10-CM

## 2022-11-06 DIAGNOSIS — E7849 Other hyperlipidemia: Secondary | ICD-10-CM | POA: Diagnosis not present

## 2022-11-06 DIAGNOSIS — R5383 Other fatigue: Secondary | ICD-10-CM | POA: Diagnosis not present

## 2022-11-06 DIAGNOSIS — I2510 Atherosclerotic heart disease of native coronary artery without angina pectoris: Secondary | ICD-10-CM

## 2022-11-06 DIAGNOSIS — I480 Paroxysmal atrial fibrillation: Secondary | ICD-10-CM | POA: Diagnosis not present

## 2022-11-06 NOTE — Progress Notes (Signed)
LIPID CLINIC CONSULT NOTE  Chief Complaint:  Follow-up dyslipidemia  Primary Care Physician: Nickola Major, MD  HPI:  Alyssa [name] is a 72 y.o. female who is being seen today for the evaluation of dyslipidemia at the request of Eksir, Earnest Conroy, MD.  This is a very pleasant 72 year old female with a significant history of dyslipidemia.  She first noted she had elevated cholesterol in her 49s.  She has a strong family history of elevated cholesterol and heart disease.  Both of her brothers are age 47 and 88 and have elevated cholesterols over 400.  Her mom also had elevated cholesterol and her father had heart disease and died at age 66.  Unfortunately she has been intolerant to statins and is been on numerous statins in the past including Crestor, Lipitor, lovastatin and ezetimibe.  She also has nonalcoholic fatty liver disease.  Most recently her total cholesterol was 343, triglycerides 154, HDL of 40 and LDL of 272.  This is after losing 40 pounds recently although she is still moderately obese on the ketogenic diet.  She is also been taking Crestor but had significant pain with it and then finally discontinued it.  She has no known coronary disease although did have a pulmonary embolus in 2013.  I personally reviewed her CT scan which does show multivessel coronary artery calcification.  Is a history of breast cancer, diabetes which has resolved with weight loss, and postconcussive syndrome.  She reports that she had had a fairly atherogenic diet however recently again switched ketogenic diet and is cut out a lot of "bad saturated fats".  03/13/2019  Alyssa [name] returns today for follow-up.  Recently had repeat lipids which showed marked improvement in numbers.  Total cholesterol is now 168 with triglycerides 132, HDL 52 and LDL of 90.  He is tolerating Praluent without any side effects.  Overall she is very pleased with her improved cholesterol numbers.  10/17/2019  Alyssa [name] returns  today for follow-up.  Overall she is doing well.  Her repeat lipid recently showed total cholesterol 151, triglycerides 142, HDL 51 and LDL 75.  This is slightly improved and she has subsequently switched from Praluent to [place] due to a change in her primary insurance.  Overall she is pleased with the medication.  12/01/2020  Alyssa [name] continues to have good control of her dyslipidemia.  Total cholesterol 150, HDL 52, triglycerides 133 and LDL 75.  She is tolerating Repatha well.  Unfortunately, she has had issues with elevated blood pressure.  She reports her primary has tried a number of different medications but they have either not gotten her to target or have been associated with side effects.  More recently she was switched off of the diuretic and onto amlodipine.  Initially this was 5 mg but then increased to 7.5 mg daily.  Since then she has had more lower extremity edema.  Blood pressure remains uncontrolled now 148/82 but improved as it was previously between 607 and 371 systolic.  She is asked for my assistance in managing her blood pressure.  06/23/2021  Alyssa [name] returns today for follow-up.  Unfortunately she became intolerant to the chlorthalidone was having a lot of side effects.  He may have been related to some hypokalemia.  She stopped her chlorthalidone and her symptoms improved.  Her blood pressure has been maintained on amlodipine.  She had lab work this summer in August through her PCP which showed a direct LDL 184, total cholesterol 250  and triglycerides 209.  This is much higher than it previously had been in March with a total cholesterol 150 and LDL 75.  She reports compliance with the medication so is difficult to understand why the cholesterol was so much higher.  I think this has to be reassessed  01/04/2022  Alyssa [name] is seen today in follow-up.  Fortunately her lipids have responded again to Repatha.  Total cholesterol now 114, glycerides 104, HDL 54 and LDL 41.  She  reports that her blood pressure still is uncontrolled.  She had been started on amlodipine 5 mg daily.  Blood pressures at home range between 140-160 systolic over 80.  She needs additional blood pressure lowering.  She has had some medicine intolerances which make this difficult including rash with an ARB.  Heart rate was noted to be low today however shows a sinus bradycardia at 57.  She says that she gets occasional palpitations recently and noted her heart rate has been jumping up for no reason.  She does wear an Apple Watch and has done EKGs of this.  She did not pay much attention to it but did note that the EKG interpretations on the Apple Watch suggested atrial fibrillation.  I reviewed these today and indeed she is having multiple episodes of atrial fibrillation.  This is a new diagnosis.  Her CHA2DS2-VASc score is at least 3 for age, female sex and hypertension.  07/13/2022  Alyssa [name] is seen today in follow-up.  Her blood pressure was surprisingly elevated today 164/110.  EKG shows a new onset atrial flutter with variable ventricular response.  She noted her heart rate was elevated but was not aware that she was out of rhythm.  She has felt more fatigued recently.  She denies any chest pain.  Fortunately she is already anticoagulated on Eliquis and reports compliance with the medication.  She has been very compliant with her Repatha and has had marked improvement in her lipids.  Total cholesterol 114, HDL 54, triglycerides 104 and LDL 41.  11/06/2022  Alyssa [name] returns today for follow-up.  She had recent repeat lipids which look pretty good however are increased compared to last June.  Total cholesterol now 155, triglycerides 146, HDL 56 and LDL 76.  EKG today shows sinus rhythm at 67.  She tells me that she has been having some recent increase in shortness of breath and fatigue.  She did have a successful cardioversion at the end of last year but then had recurrent atrial fibrillation and  flutter with multiple occurrences that were picked up on her Apple Watch.  She was advised to take extra metoprolol as needed for these things however she says her symptoms have been worsening.  It was noted that she does have early onset significant heart disease in multiple family members and was previously noted to have multivessel coronary calcification on CT.  I am concerned about the possibility that her shortness of breath is an anginal equivalent.  PMHx:  Past Medical History:  Diagnosis Date   Abdominal pain    Anxiety    Arthritis    Asthma    Breast cancer 06/2013   left upper inner   Cancer    breast   Diabetes mellitus without complication    Takes Tradjenta   Family history of adverse reaction to anesthesia    Patients mother had to be resusciated after having anesthesia   GERD (gastroesophageal reflux disease)    Headache(784.0)    Takes Depakote  Hernia, incisional periumbilical, incarcerated 08/07/2011   Hot flashes    Hx of radiation therapy 09/09/13- 10/13/13   left breast 5000 cGy in 25 sessions, no boost   Hyperlipidemia    Hypertension    PCP DR Creta Levin   Hypothyroidism    Non-alcoholic fatty liver disease    Peripheral vascular disease    pulmonary embolis-still have some   Post concussion syndrome    4 yrs ago- sees [facility]- Dr Pearlean Brownie every 6 months-   has sleep study 2011 following accident but was neg per patient-  short term memory issues, dates and times   Pulmonary embolism 2013   Recurrent upper respiratory infection (URI)    FLU 08/23/11-08/29/11 then URI 08/29/11- 09/08/11- S/P zpack and steroids-  improved      no cough or congestion   Thyroid disease    Thyroid mass, left inferior lobe, 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  Dr. Daphine Deutscher   BREAST BIOPSY     BREAST LUMPECTOMY Left 2015   radiation   BREAST LUMPECTOMY WITH NEEDLE LOCALIZATION AND AXILLARY SENTINEL LYMPH NODE BX Left 08/04/2013   Procedure: BREAST LUMPECTOMY WITH NEEDLE LOCALIZATION AND AXILLARY SENTINEL LYMPH NODE Biopsy x 3;  Surgeon: Emelia Loron, MD;  [facility] OR;  Service: General;  Laterality: Left;   BREAST SURGERY     CARDIOVERSION N/A 07/26/2022   Procedure: CARDIOVERSION;  Surgeon: Maisie Fus, MD;  Location: [facility] ENDOSCOPY;  Service: Cardiovascular;  Laterality: N/A;   CARPAL TUNNEL RELEASE  10/2009-12/2010   left - right   CESAREAN SECTION  X 2   COLONOSCOPY     DIAGNOSTIC LAPAROSCOPY     Lap. chole., appendectomy, hernia repair   HERNIA REPAIR  09/15/11   Ventral w/mesh   JOINT REPLACEMENT     KNEE ARTHROPLASTY Right 12/22/2015   Procedure: COMPUTER ASSISTED TOTAL KNEE ARTHROPLASTY;  Surgeon: Eldred Manges, MD;  Location: [facility] OR;  Service: Orthopedics;  Laterality: Right;   LAPAROSCOPIC CHOLECYSTECTOMY  2006   Dr. Randon Goldsmith TUNNEL RELEASE Bilateral    THYROIDECTOMY, PARTIAL     VENTRAL HERNIA REPAIR  09/29/2011   Procedure: LAPAROSCOPIC VENTRAL HERNIA;  Surgeon: Ardeth Sportsman, MD;  Location: [facility] ORS;  Service: General;  Laterality: N/A;  Laparoscopic Ventral Wall Hernia Repair with Mesh    FAMHx:  Family History  Problem Relation Age of Onset   Malignant hyperthermia Mother    Heart attack Father    Stroke Father    Diabetes Other        Grandmother   Other Other        respiratory- Grandmother   Cancer Maternal Grandmother 41       breast   Breast cancer Maternal Grandmother     SOCHx:   reports that she has never smoked. She has never used smokeless tobacco. She reports that she does not drink alcohol and does not use drugs.  ALLERGIES:  Allergies  Allergen Reactions   Aleve [Naproxen  Sodium] Hives, Itching and Swelling   Aromasin [Exemestane] Anaphylaxis, Itching and Swelling    Pt currently tolerating medication well (02/03/16)-gwd; Pt stated "I can take  this medication for a few months at a time and I take a benadryl when I feel it starting to come on" Patient has facial swelling, itching, and throat swelling  07/12/17  Taking this right now and no problem. sy/rn   Letrozole Shortness Of Breath, Rash and Other (See Comments)    Swelling - feet, eyes,hands;   Speech garbled   Levofloxacin Rash and Swelling    Facial swelling and red facial rash   Penicillins Hives, Swelling and Rash    All over the body. Has patient had a PCN reaction causing immediate rash, facial/tongue/throat swelling, SOB or lightheadedness with hypotension: Yes Has patient had a PCN reaction causing severe rash involving mucus membranes or skin necrosis: No Has patient had a PCN reaction that required hospitalization No Has patient had a PCN reaction occurring within the last 10 years: No If all of the above answers are "NO", then may proceed with Cephalosporin use.    Symbicort [Budesonide-Formoterol Fumarate] Anaphylaxis, Hives, Swelling and Rash   Codeine Other (See Comments)    hallucinations   Percocet [Oxycodone-Acetaminophen] Other (See Comments)    Causes syncope day after medication has been taken.   Vicodin [Hydrocodone-Acetaminophen] Other (See Comments)    Causes syncope day after medication has been taken.   Doxycycline Swelling    Must with  antihistamine   Ezetimibe Other (See Comments)    Myalgias,fatigue   Prednisone Other (See Comments)    Can take with antihistamine/ can take the shot without antihistamine   Cefdinir Rash and Swelling   Losartan Rash    ROS: Pertinent items noted in HPI and remainder of comprehensive ROS otherwise negative.  HOME MEDS: Current Outpatient Medications on File Prior to Visit  Medication Sig Dispense Refill   albuterol (VENTOLIN HFA) 108  (90 Base) MCG/ACT inhaler Inhale 2 puffs into the lungs every 6 (six) hours as needed for wheezing or shortness of breath.      amLODipine (NORVASC) 5 MG tablet Take 1 tablet (5 mg total) by mouth in the morning AND 1 tablet (5 mg total) every evening. 180 tablet 3   amphetamine-dextroamphetamine (ADDERALL) 20 MG tablet Take 20 mg by mouth 2 (two) times daily. Take 20mg  in the morning and 20mg  in the afternoon.     apixaban (ELIQUIS) 5 MG TABS tablet Take 1 tablet (5 mg total) by mouth 2 (two) times daily. 60 tablet 11   cetirizine (ZYRTEC) 10 MG tablet Take 10 mg by mouth daily.     cholecalciferol (VITAMIN D3) 25 MCG (1000 UNIT) tablet Take 1,000 Units by mouth daily.     Evolocumab (REPATHA SURECLICK) 140 MG/ML SOAJ INJECT 1 PEN INTO THE SKIN EVERY 14 DAYS 6 mL 3   fluticasone (FLOVENT HFA) 110 MCG/ACT inhaler Inhale 2 puffs into the lungs daily. 12 g 5   levothyroxine (SYNTHROID) 112 MCG tablet Take 112 mcg by mouth daily before breakfast.     metoprolol succinate (TOPROL-XL) 25 MG 24 hr tablet TAKE 1/2 TABLET(12.5 MG) BY MOUTH DAILY. MAY TAKE EXTRA 1/2 TABLET FOR SUSTAINED HEART RATE>100 FOR 45 MINUTES 30 tablet 3   omeprazole (PRILOSEC) 40 MG capsule TAKE 1 CAPSULE BY MOUTH DAILY 30 capsule 0   potassium chloride (KLOR-CON) 10 MEQ tablet TAKE 1 TABLET(10 MEQ) BY MOUTH DAILY 90 tablet 3   Semaglutide, 2 MG/DOSE, (OZEMPIC, 2 MG/DOSE,) 8 MG/3ML SOPN Inject 2 mg into the skin every Sunday.     vitamin E 180 MG (400 UNITS) capsule Take 400 Units  by mouth daily.     Current Facility-Administered Medications on File Prior to Visit  Medication Dose Route Frequency Provider Last Rate Last Admin   fluticasone (FLOVENT HFA) 44 MCG/ACT inhaler 2 puff  2 puff Inhalation BID Coralyn Helling, MD        LABS/IMAGING: No results found for this or any previous visit (from the past 48 hour(s)). No results found.  LIPID PANEL:    Component Value Date/Time   CHOL 114 12/22/2021 1015   TRIG 104 12/22/2021  1015   HDL 54 12/22/2021 1015   CHOLHDL 2.1 12/22/2021 1015   LDLCALC 41 12/22/2021 1015    WEIGHTS: Wt Readings from Last 3 Encounters:  11/06/22 207 lb 6.4 oz (94.1 kg)  10/18/22 204 lb (92.5 kg)  08/17/22 201 lb 6.4 oz (91.4 kg)    VITALS: BP 132/82   Pulse 67   Ht 5\' 2"  (1.575 m)   Wt 207 lb 6.4 oz (94.1 kg)   SpO2 95%   BMI 37.93 kg/m   EXAM: General appearance: alert, no distress, and moderately obese Lungs: clear to auscultation bilaterally Heart: irregularly irregular rhythm Extremities: extremities normal, atraumatic, no cyanosis or edema Neurologic: Grossly normal Psych: Pleasant  EKG: Normal sinus rhythm at 67, voltage criteria for LVH-personally reviewed  ASSESSMENT: DOE, Fatigue History of atrial flutter s/p DCCV (2023) Paroxysmal atrial fibrillation-CHA2DS2-VASc score of 3 Probable HeFH-Dutch Score of 6 Family history of premature coronary disease Statin intolerance Multivessel coronary calcification Uncontrolled hypertension  PLAN: 1.   Alyssa [name] is in sinus rhythm today however she reports multiple breakthrough episodes of A-fib.  She has been feeling fatigued and has been worsening with regards to shortness of breath.  I am not convinced that the symptoms are all related to arrhythmia.  She does have multivessel coronary calcification.  This could be an anginal equivalent.  I advised proceeding with a CT coronary angiogram to look for possible obstructive coronary disease.  She will likely need to take a higher dose of metoprolol, specifically 50 mg of metoprolol XL on the day of the procedure.  Hopefully there will not be any arrhythmias.  If this testing is reassuring without obstruction then we could consider antiarrhythmic drug therapy.  If there is evidence of obstructive coronary disease, then she will need a definitive catheterization.  She understands this plan and is willing to proceed.  Plan follow-up afterwards.  Chrystie Nose, MD,  [facility], FACP  [place]  [facility]  Medical Director of the Advanced Lipid Disorders &  Cardiovascular Risk Reduction Clinic Diplomate of the American Board of Clinical Lipidology Attending Cardiologist  Direct Dial: [phone]  Fax: [phone]  Website:  [email or web address] Nicely Alyssa [name] 11/06/2022, 1:27 PM

## 2022-11-06 NOTE — Patient Instructions (Signed)
Medication Instructions:  PLEASE TAKE METOPROLOL SUCCINATE 50mg  THE DAY OF YOUR CCTA SCAN  *If you need a refill on your cardiac medications before your next appointment, please call your pharmacy*  Lab Work: None Ordered At This Time.  If you have labs (blood work) drawn today and your tests are completely normal, you will receive your results only by: MyChart Message (if you have MyChart) OR A paper copy in the mail If you have any lab test that is abnormal or we need to change your treatment, we will call you to review the results.  Testing/Procedures: Your physician has requested that you have cardiac CT. Cardiac computed tomography (CT) is a painless test that uses an x-ray machine to take clear, detailed pictures of your heart. For further information please visit [email or web address] Please follow instruction sheet as given.    Your cardiac CT will be scheduled at one of the below locations:   [facility] [address] [place], [place] [address] [phone]  If scheduled at [facility], please arrive at the [facility] and Children's Entrance (Entrance C2) of [facility] 30 minutes prior to test start time. You can use the FREE valet parking offered at entrance C (encouraged to control the heart rate for the test)  Proceed to the [facility] Radiology Department (first floor) to check-in and test prep.  All radiology patients and guests should use entrance C2 at [facility], accessed from [facility], even though the hospital's physical address listed is [address].    Please follow these instructions carefully (unless otherwise directed):  Hold all erectile dysfunction medications at least 3 days (72 hrs) prior to test. (Ie viagra, cialis, sildenafil, tadalafil, etc) We will administer nitroglycerin during this exam.   On the Night Before the Test: Be sure to Drink plenty of water. Do not consume any  caffeinated/decaffeinated beverages or chocolate 12 hours prior to your test. Do not take any antihistamines 12 hours prior to your test. If the patient has contrast allergy:  On the Day of the Test: Drink plenty of water until 1 hour prior to the test. Do not eat any food 1 hour prior to test. You may take your regular medications prior to the test.  Take metoprolol SUCCINATE 50mg  THE DAY OF TEST If you take Furosemide/Hydrochlorothiazide/Spironolactone, please HOLD on the morning of the test. FEMALES- please wear underwire-free bra if available, avoid dresses & tight clothing  After the Test: Drink plenty of water. After receiving IV contrast, you may experience a mild flushed feeling. This is normal. On occasion, you may experience a mild rash up to 24 hours after the test. This is not dangerous. If this occurs, you can take Benadryl 25 mg and increase your fluid intake. If you experience trouble breathing, this can be serious. If it is severe call 911 IMMEDIATELY. If it is mild, please call our office. If you take any of these medications: Glipizide/Metformin, Avandament, Glucavance, please do not take 48 hours after completing test unless otherwise instructed.  We will call to schedule your test 2-4 weeks out understanding that some insurance companies will need an authorization prior to the service being performed.   For non-scheduling related questions, please contact the cardiac imaging nurse navigator should you have any questions/concerns: Rockwell Alexandria, Cardiac Imaging Nurse Navigator Larey Brick, Cardiac Imaging Nurse Navigator [facility] Direct Office Dial: [phone]   For scheduling needs, including cancellations and rescheduling, please  call [place], [phone].   Follow-Up: At [facility], you and your health needs are our priority.  As part of our continuing mission to provide you with exceptional heart care, we have  created designated Provider Care Teams.  These Care Teams include your primary Cardiologist (physician) and Advanced Practice Providers (APPs -  Physician Assistants and Nurse Practitioners) who all work together to provide you with the care you need, when you need it.  Your next appointment:   1 MONTH WITH APP   Provider:   Chrystie Nose, MD

## 2022-11-09 ENCOUNTER — Telehealth (HOSPITAL_COMMUNITY): Payer: Self-pay | Admitting: *Deleted

## 2022-11-09 NOTE — Telephone Encounter (Signed)
Patient returning call about her upcoming cardiac imaging study; pt verbalizes understanding of appt date/time, parking situation and where to check in, pre-test NPO status and medications ordered, and verified current allergies; name and call back number provided for further questions should they arise  Alyssa Brick RN Navigator Cardiac Imaging [facility] [phone] office [phone] cell  Patient to take  metoprolol succinate 2 hours prior to her cardiac CT scan.  She is aware to arrive at 12:30pm.

## 2022-11-09 NOTE — Telephone Encounter (Signed)
Attempted to call patient regarding upcoming cardiac CT appointment. °Left message on voicemail with name and callback number ° °Nicklaus Alviar RN Navigator Cardiac Imaging °[facility] °[phone] Office °[phone] Cell ° °

## 2022-11-10 ENCOUNTER — Ambulatory Visit (HOSPITAL_COMMUNITY)
Admission: RE | Admit: 2022-11-10 | Discharge: 2022-11-10 | Disposition: A | Discharge status: Home / Self Care | Payer: Medicare PPO | Source: Ambulatory Visit | Attending: Internal Medicine | Admitting: Internal Medicine

## 2022-11-10 DIAGNOSIS — I2510 Atherosclerotic heart disease of native coronary artery without angina pectoris: Secondary | ICD-10-CM | POA: Diagnosis not present

## 2022-11-10 DIAGNOSIS — K760 Fatty (change of) liver, not elsewhere classified: Secondary | ICD-10-CM | POA: Diagnosis not present

## 2022-11-10 DIAGNOSIS — R0609 Other forms of dyspnea: Secondary | ICD-10-CM | POA: Diagnosis present

## 2022-11-10 DIAGNOSIS — I700 Atherosclerosis of aorta: Secondary | ICD-10-CM | POA: Insufficient documentation

## 2022-11-10 MED ORDER — NITROGLYCERIN 0.4 MG SL SUBL
0.8000 mg | SUBLINGUAL_TABLET | Freq: Once | SUBLINGUAL | Status: AC
  Administered 2022-11-10: 0.8 mg via SUBLINGUAL

## 2022-11-10 MED ORDER — NITROGLYCERIN 0.4 MG SL SUBL
SUBLINGUAL_TABLET | SUBLINGUAL | Status: AC
  Filled 2022-11-10: qty 2

## 2022-11-10 MED ORDER — IOHEXOL 350 MG/ML SOLN: 100.0000 mL | Freq: Once | INTRAVENOUS | Status: DC | PRN

## 2022-11-10 MED ORDER — IOHEXOL 350 MG/ML SOLN
100.0000 mL | Freq: Once | INTRAVENOUS | Status: AC | PRN
  Administered 2022-11-10: 100 mL via INTRAVENOUS

## 2022-11-22 ENCOUNTER — Ambulatory Visit
Admission: RE | Admit: 2022-11-22 | Discharge: 2022-11-22 | Disposition: A | Discharge status: Home / Self Care | Payer: Medicare PPO | Source: Ambulatory Visit | Attending: Family Medicine | Admitting: Family Medicine

## 2022-11-22 DIAGNOSIS — Z0000 Encounter for general adult medical examination without abnormal findings: Secondary | ICD-10-CM

## 2022-12-12 NOTE — Progress Notes (Signed)
Cardiology Clinic Note   Date: 12/15/2022 ID: Alyssa, [name] 06/21/1951, MRN [id number]  Primary Cardiologist:  Alyssa Nose, MD  Patient Profile    Alyssa [name] is a 71 y.o. female who presents to the clinic today for follow up of testing.   Past medical history significant for: Nonobstructive CAD.  Coronary CTA 11/10/2022: Calcium score 56 (64th percentile).  Nonobstructive CAD.  Mild (25 to 49%) mid RCA and OM1.  Minimal (0 to 24%), proximal LCx and mid LCx, proximal RCA. PAF/Aflutter. Onset June 2023, started on anticoagulation.  Cardioversion 07/26/2022.  Echo 04/25/2022: EF 55-60%. Mild LAE. Mild MR.  Hypertension.  Hyperlipidemia.  LPa 07/13/2022: 158.1. Lipid panel 11/03/2022: LDL 76, HDL 56, TG 146, total 155. PE 2010.  Breast CA.    History of Present Illness    Alyssa [name] was first evaluated by Dr. Rennis [name] on 06/14/2018 for dyslipidemia at the request of Alyssa [name]. At an office visit with Dr. Rennis [name] on 01/04/2022 patient noted occasional palpitations with unexplained increase in heart rate. Apple Watch suggested afib. Apple Watch EKGs were evaluated and confirmed Afib. Patient was started on anticoagulation. Patient was last seen in the office on 07/13/2022 by Dr. Rennis [name]. At that time she was noted to be hypertensive and EKG showed new onset aflutter with variable ventricular response. She had no cardiac awareness of dysrhythmia but did admit to increased fatigue. She has been compliant with anticoagulation since June. She underwent successful cardioversion on 07/26/2022.   Patient followed up post cardioversion with reports of 1 episode of heart racing lasting approximately 30 minutes and resolving on its own.  She reported DOE was improving at that time when she was getting back to her normal activities.  She returned to the office on 10/18/2022 as an add-on with complaints of episodic A-fib with associated shortness of breath and dizziness.  Case was discussed  with Dr. Wyline [name], 3 times daily, who agreed with plan to take extra dose of metoprolol for episodes of increased heart rate over A-fib.  She was instructed to contact the office with with sustained heart rate >120 BPM or if extra dose of metoprolol does not break episode.  Patient was last seen in the office by Dr. Rennis [name] on 11/06/2022.  She reported worsening symptoms despite extra dose of metoprolol.  Concern that her shortness of breath was anginal equivalent and coronary CTA was ordered which showed a calcium score of 56 and nonobstructive CAD.  Today, patient reports continued DOE. She states she can do her normal activities for 30 minutes then she becomes short of breath and has to stop. She recently visited family in [place] at the end of April and could not keep up with her daughter and grandkids. She also feels like it is taking her longer to recover than previous. She has been evaluated by pulmonary in the past couple of months. Her afib burden per her Apple Watch has been 2% in the last month. 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 No chest pain, pressure, or tightness. Denies lower extremity edema, orthopnea, or PND. Discussed results of coronary CTA.     ROS: All other systems reviewed and are otherwise negative except as noted in History of Present Illness.  Studies Reviewed    ECG is not ordered today.   Risk Assessment/Calculations     CHA2DS2-VASc Score = 3   This indicates a 3.2% annual  risk of stroke. The patient's score is based upon: CHF History: 0 HTN History: 1 Diabetes History: 0 Stroke History: 0 Vascular Disease History: 0 Age Score: 1 Gender Score: 1             Physical Exam    VS:  BP 127/82   Pulse 78   Ht 5\' 2"  (1.575 m)   Wt 203 lb 9.6 oz (92.4 kg)   SpO2 98%   BMI 37.24 kg/m  , BMI Body mass index is 37.24 kg/m.  GEN: Well nourished, well developed, in no acute distress. Neck: No  JVD or carotid bruits. Cardiac:  RRR. No murmurs. No rubs or gallops.   Respiratory:  Respirations regular and unlabored. Clear to auscultation without rales, wheezing or rhonchi. GI: Soft, nontender, nondistended. Extremities: Radials/DP/PT 2+ and equal bilaterally. No clubbing or cyanosis. No edema.  Skin: Warm and dry, no rash. Neuro: Strength intact.  Assessment & Plan    Nonobstructive CAD.  Coronary CTA April 2024 showed calcium score 56, nonobstructive CAD.  Patient denies chest pain, pressure, tightness.  Continue metoprolol, Repatha.  DOE/activity intolerance. Patient reports continued DOE and activity intolerance. She reports after 30 minutes of her normal activities she feels short of breath and has to stop. She recently visited family in [place] and could not keep up with her daughter and grandchildren. She has been evaluated by pulmonary a couple of months ago and everything was normal. Will get a repeat echo for further evaluation.   PAF/A-flutter.  S/p cardioversion January 2024.  Recurrent episodes since cardioversion.  Patient reports 2% afib burden in the last month per her Apple Watch. She does not think she is having sustained episodes of afib that are causing her to be intolerant of activity and short of breath. Denies spontaneous bleeding concerns.  RRR today on exam. Continue metoprolol and Eliquis. Hyperlipidemia.  LDL February 2024 76.  Continue Repatha. Hypertension. BP today 127/82 Patient denies headaches, dizziness or vision changes. Continue amlodipine, metoprolol.  Disposition: Echo. Return in 4-6 months or sooner as needed.          Signed, Alyssa [name]. Alyssa Duffy, DNP, NP-C

## 2022-12-14 ENCOUNTER — Ambulatory Visit: Payer: Medicare PPO | Admitting: Student

## 2022-12-15 ENCOUNTER — Ambulatory Visit: Payer: Medicare PPO | Attending: Student | Admitting: Student

## 2022-12-15 ENCOUNTER — Encounter: Payer: Self-pay | Admitting: Student

## 2022-12-15 VITALS — BP 127/82 | HR 78 | Ht 62.0 in | Wt 203.6 lb

## 2022-12-15 DIAGNOSIS — R0609 Other forms of dyspnea: Secondary | ICD-10-CM | POA: Diagnosis not present

## 2022-12-15 DIAGNOSIS — E785 Hyperlipidemia, unspecified: Secondary | ICD-10-CM

## 2022-12-15 DIAGNOSIS — I10 Essential (primary) hypertension: Secondary | ICD-10-CM

## 2022-12-15 DIAGNOSIS — I480 Paroxysmal atrial fibrillation: Secondary | ICD-10-CM | POA: Diagnosis not present

## 2022-12-15 DIAGNOSIS — I2510 Atherosclerotic heart disease of native coronary artery without angina pectoris: Secondary | ICD-10-CM | POA: Diagnosis not present

## 2022-12-15 DIAGNOSIS — R6889 Other general symptoms and signs: Secondary | ICD-10-CM | POA: Diagnosis not present

## 2022-12-15 NOTE — Patient Instructions (Signed)
Medication Instructions:  Your physician recommends that you continue on your current medications as directed. Please refer to the Current Medication list given to you today.  *If you need a refill on your cardiac medications before your next appointment, please call your pharmacy*   Lab Work: NONE If you have labs (blood work) drawn today and your tests are completely normal, you will receive your results only by: MyChart Message (if you have MyChart) OR A paper copy in the mail If you have any lab test that is abnormal or we need to change your treatment, we will call you to review the results.   Testing/Procedures: Your physician has requested that you have an echocardiogram. Echocardiography is a painless test that uses sound waves to create images of your heart. It provides your doctor with information about the size and shape of your heart and how well your heart's chambers and valves are working. This procedure takes approximately one hour. There are no restrictions for this procedure. Please do NOT wear cologne, perfume, aftershave, or lotions (deodorant is allowed). Please arrive 15 minutes prior to your appointment time.    Follow-Up: At [facility], you and your health needs are our priority.  As part of our continuing mission to provide you with exceptional heart care, we have created designated Provider Care Teams.  These Care Teams include your primary Cardiologist (physician) and Advanced Practice Providers (APPs -  Physician Assistants and Nurse Practitioners) who all work together to provide you with the care you need, when you need it.  We recommend signing up for the patient portal called "MyChart".  Sign up information is provided on this After Visit Summary.  MyChart is used to connect with patients for Virtual Visits (Telemedicine).  Patients are able to view lab/test results, encounter notes, upcoming appointments, etc.  Non-urgent messages can be sent to your  provider as well.   To learn more about what you can do with MyChart, go to [email or web address].    Your next appointment:   4-6 month(s)  Provider:   Chrystie Nose, MD

## 2023-01-13 ENCOUNTER — Other Ambulatory Visit: Payer: Self-pay | Admitting: Internal Medicine

## 2023-01-13 DIAGNOSIS — E7849 Other hyperlipidemia: Secondary | ICD-10-CM

## 2023-01-15 ENCOUNTER — Other Ambulatory Visit: Payer: Self-pay | Admitting: Internal Medicine

## 2023-01-15 NOTE — Telephone Encounter (Signed)
Prescription refill request for Eliquis received. Indication:PE Last office visit:5/24 Scr:0.81  4/24 Age: 72 Weight:92.4  KG  PRESCRIPTION REFILLED

## 2023-01-19 ENCOUNTER — Encounter (HOSPITAL_COMMUNITY): Payer: Self-pay

## 2023-01-19 ENCOUNTER — Ambulatory Visit (HOSPITAL_COMMUNITY): Payer: Medicare PPO

## 2023-01-19 ENCOUNTER — Ambulatory Visit (HOSPITAL_COMMUNITY)
Admission: RE | Admit: 2023-01-19 | Discharge: 2023-01-19 | Disposition: A | Discharge status: Home / Self Care | Payer: Medicare PPO | Source: Ambulatory Visit | Attending: Family Medicine | Admitting: Family Medicine

## 2023-01-19 DIAGNOSIS — I080 Rheumatic disorders of both mitral and aortic valves: Secondary | ICD-10-CM | POA: Diagnosis not present

## 2023-01-19 DIAGNOSIS — R0609 Other forms of dyspnea: Secondary | ICD-10-CM | POA: Diagnosis present

## 2023-01-19 LAB — ECHOCARDIOGRAM COMPLETE
AR max vel: 1.56 cm2
AV Area VTI: 1.72 cm2
AV Area mean vel: 1.73 cm2
AV Mean grad: 12 mmHg
AV Peak grad: 20.1 mmHg
Ao pk vel: 2.24 m/s
Area-P 1/2: 3.77 cm2
MV M vel: 5.3 m/s
MV Peak grad: 112.4 mmHg
S' Lateral: 3 cm

## 2023-01-19 NOTE — Progress Notes (Signed)
Echocardiogram 2D Echocardiogram has been performed.  Alyssa [name] 01/19/2023, 3:24 PM

## 2023-01-30 ENCOUNTER — Encounter: Payer: Self-pay | Admitting: Primary Care

## 2023-01-30 ENCOUNTER — Ambulatory Visit: Payer: Medicare PPO | Admitting: Primary Care

## 2023-01-30 VITALS — BP 106/68 | HR 78 | Temp 97.7°F | Ht 62.0 in | Wt 207.8 lb

## 2023-01-30 DIAGNOSIS — R0609 Other forms of dyspnea: Secondary | ICD-10-CM | POA: Diagnosis not present

## 2023-01-30 MED ORDER — FLUTICASONE-SALMETEROL 250-50 MCG/ACT IN AEPB: 1.0000 | INHALATION_SPRAY | Freq: Two times a day (BID) | RESPIRATORY_TRACT | 2 refills | Status: DC

## 2023-01-30 NOTE — Progress Notes (Signed)
@Patient  ID: Alyssa [name], female    DOB: Jan 14, 1951, 72 y.o.   MRN: [id number]  Chief Complaint  Patient presents with   Follow-up    SOB persistent.    Referring provider: Gwenlyn Found, MD  HPI: 72 year old never smoker presents for evaluation of shortness of breath and wheezing   Previous [facility] pulmonary encounter:  04/25/22- Dr. Vassie Loll She has been seen in our office in the past for upper airway cough syndrome attributed to GERD, last office visit was 2019 for cough flare She follows with cardiology for hyperlipidemia and atrial fibrillation was started on apixaban 02/2022. She reports a flareup of her cough for about 4 weeks in spite of taking omeprazole daily.  She reports increased dyspnea on exertion and fatigue for 2 weeks.  She had fracture of her left fifth metatarsal and she has been wearing a boot for 10 weeks. She had an ED visit 9/28 which I reviewed, given albuterol nebulizer, given spacer to use with Proventil, D-dimer was negative, CT angiogram did not show any evidence of pulm embolism, EKG showed sinus rhythm. She was noted to desaturate on exertion She had a follow-up visit with cardiology 10/3, echo was performed results are pending  Detailed environmental history was obtained -no change in her house or bedroom, no new blankets, pets She is a retired Chartered loss adjuster, obtained her [employer]olic[employer]  Allergy -to prednisone PMH: Breast cancer 2014, DM, GERD, HA, HLD, HTN, Hypothyroidism, PAD -Submassive pulmonary embolism 2013, 10 days after hernia surgery, strong family history her dad died at age 51 of PE, took anticoagulation for 1 year -Postconcussion syndrome 2015  She was only able to ambulate 1 lap due to shortness of breath and heart rate increased from 85-97, saturation stayed at 97   01/30/2023 Patient presents today for 8 month follow-up/ Asthma, UACS. Shortness of breath started back in the fall. She had an cardiac ablation in January for afib and has  been in regular rhythm since. She is still having persistent shortness of breath. Associated cough and wheezing symptoms. She is compliant with flovent as prescribed. Uses Albuterol sparingly. She has trouble taking a full deep breath. No longer having issues with hypoxemia. Weight has not significantly changed.  She needs to rest after 20 minutes of mild exertion. This has limited her ability to do things around the house and outside. She had normal spirometry in October 2023; Lungs were clear on CTA imaging in September 2023. She takes Prilosec twice daily for GERD.   Significant tests/ events reviewed  CTA chest 04/20/22 neg CT angio chest 10/07/11 >> large PE  FeNO 04/30/17 >> 14  Allergies  Allergen Reactions   Aleve [Naproxen Sodium] Hives, Itching and Swelling   Aromasin [Exemestane] Anaphylaxis, Itching and Swelling    Pt currently tolerating medication well (02/03/16)-gwd; Pt stated "I can take this medication for a few months at a time and I take a benadryl when I feel it starting to come on" Patient has facial swelling, itching, and throat swelling  07/12/17  Taking this right now and no problem. sy/rn   Letrozole Shortness Of Breath, Rash and Other (See Comments)    Swelling - feet, eyes,hands;   Speech garbled   Levofloxacin Rash and Swelling    Facial swelling and red facial rash   Penicillins Hives, Swelling and Rash    All over the body. Has patient had a PCN reaction causing immediate rash, facial/tongue/throat swelling, SOB or lightheadedness with hypotension: Yes Has patient had  a PCN reaction causing severe rash involving mucus membranes or skin necrosis: No Has patient had a PCN reaction that required hospitalization No Has patient had a PCN reaction occurring within the last 10 years: No If all of the above answers are "NO", then may proceed with Cephalosporin use.    Symbicort [Budesonide-Formoterol Fumarate] Anaphylaxis, Hives, Swelling and Rash   Codeine Other (See  Comments)    hallucinations   Percocet [Oxycodone-Acetaminophen] Other (See Comments)    Causes syncope day after medication has been taken.   Vicodin [Hydrocodone-Acetaminophen] Other (See Comments)    Causes syncope day after medication has been taken.   Doxycycline Swelling    Must with  antihistamine   Ezetimibe Other (See Comments)    Myalgias,fatigue   Prednisone Other (See Comments)    Can take with antihistamine/ can take the shot without antihistamine   Cefdinir Rash and Swelling   Losartan Rash    Immunization History  Administered Date(s) Administered   Influenza Split 04/30/2016   Influenza-Unspecified 06/11/2014   Moderna Sars-Covid-2 Vaccination 08/13/2019, 09/13/2019    Past Medical History:  Diagnosis Date   Abdominal pain    Anxiety    Arthritis    Asthma    Breast cancer (HCC) 06/2013   left upper inner   Cancer (HCC)    breast   Diabetes mellitus without complication (HCC)    Takes Tradjenta   Family history of adverse reaction to anesthesia    Patients mother had to be resusciated after having anesthesia   GERD (gastroesophageal reflux disease)    Headache(784.0)    Takes Depakote   Hernia, incisional periumbilical, incarcerated 08/07/2011   Hot flashes    Hx of radiation therapy 09/09/13- 10/13/13   left breast 5000 cGy in 25 sessions, no boost   Hyperlipidemia    Hypertension    PCP DR Creta Levin   Hypothyroidism    Non-alcoholic fatty liver disease    Peripheral vascular disease (HCC)    pulmonary embolis-still have some   Post concussion syndrome    4 yrs ago- sees [facility]- Dr Pearlean Brownie every 6 months-   has sleep study 2011 following accident but was neg per patient-  short term memory issues, dates and times   Pulmonary embolism (HCC) 2013   Recurrent upper respiratory infection (URI)    FLU 08/23/11-08/29/11 then URI 08/29/11- 09/08/11- S/P zpack and steroids-  improved      no cough or congestion   Thyroid disease    Thyroid mass, left  inferior lobe, 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      amLODipine (NORVASC) 5 MG tablet Take 1 tablet (5 mg total) by mouth in the morning AND 1 tablet (5 mg total) every evening. 180 tablet 3   amphetamine-dextroamphetamine (ADDERALL) 20 MG tablet Take 20 mg by mouth 2 (two) times daily. Take 20mg  in the morning and 20mg  in the afternoon.     cetirizine (ZYRTEC) 10 MG tablet Take 10 mg by mouth daily.     cholecalciferol (VITAMIN D3) 25 MCG (1000 UNIT) tablet Take 1,000 Units by mouth daily.  Dextromethorphan-Bupropion (AUVELITY PO) Take 1 tablet by mouth 2 (two) times daily.     Doxepin HCl 6 MG TABS Take 1 tablet by mouth at bedtime.     ELIQUIS 5 MG TABS tablet TAKE 1 TABLET(5 MG) BY MOUTH TWICE DAILY 60 tablet 11   Evolocumab (REPATHA SURECLICK) 140 MG/ML SOAJ INJECT 1 PEN INTO THE SKIN EVERY 14 DAYS 2 mL 11   levothyroxine (SYNTHROID) 112 MCG tablet Take 100 mcg by mouth daily before breakfast.     metoprolol succinate (TOPROL-XL) 25 MG 24 hr tablet TAKE 1/2 TABLET(12.5 MG) BY MOUTH DAILY. MAY TAKE EXTRA 1/2 TABLET FOR SUSTAINED HEART RATE>100 FOR 45 MINUTES 30 tablet 3   omeprazole (PRILOSEC) 40 MG capsule TAKE 1 CAPSULE BY MOUTH DAILY 30 capsule 0   potassium chloride (KLOR-CON) 10 MEQ tablet TAKE 1 TABLET(10 MEQ) BY MOUTH DAILY 90 tablet 3   Semaglutide, 2 MG/DOSE, (OZEMPIC, 2 MG/DOSE,) 8 MG/3ML SOPN Inject 2 mg into the skin every Sunday.     vitamin E 180 MG (400 UNITS) capsule Take 400 Units by mouth daily.     fluticasone (FLOVENT HFA) 110 MCG/ACT inhaler Inhale 2 puffs into the lungs daily. 12 g 5   fluticasone  (FLOVENT HFA) 44 MCG/ACT inhaler 2 puff  (Patient taking differently: Inhale 2 puffs into the lungs 2 (two) times daily.)     No facility-administered medications prior to visit.      Review of Systems  Review of Systems  Constitutional: Negative.   HENT: Negative.    Respiratory:  Positive for cough, shortness of breath and wheezing.   Cardiovascular: Negative.      Physical Exam  BP 106/68 (BP Location: Left Arm, Patient Position: Sitting, Cuff Size: Large)   Pulse 78   Temp 97.7 F (36.5 C) (Oral)   Ht 5\' 2"  (1.575 m)   Wt 207 lb 12.8 oz (94.3 kg)   SpO2 96%   BMI 38.01 kg/m  Physical Exam Constitutional:      Appearance: Normal appearance.  HENT:     Head: Normocephalic and atraumatic.     Mouth/Throat:     Mouth: Mucous membranes are moist.     Pharynx: Oropharynx is clear.  Cardiovascular:     Rate and Rhythm: Normal rate and regular rhythm.  Pulmonary:     Effort: Pulmonary effort is normal.     Breath sounds: Normal breath sounds.  Musculoskeletal:        General: Normal range of motion.  Skin:    General: Skin is warm and dry.  Neurological:     General: No focal deficit present.     Mental Status: She is alert and oriented to person, place, and time. Mental status is at baseline.  Psychiatric:        Mood and Affect: Mood normal.        Behavior: Behavior normal.        Thought Content: Thought content normal.        Judgment: Judgment normal.      Lab Results:  CBC    Component Value Date/Time   WBC 7.2 07/13/2022 1057   WBC 6.7 04/20/2022 1517   RBC 5.21 07/13/2022 1057   RBC 4.74 04/20/2022 1517   HGB 13.3 07/26/2022 1111   HGB 14.4 07/13/2022 1057   HGB 13.9 07/27/2016 1450   HCT 39.0 07/26/2022 1111   HCT 43.9 07/13/2022 1057   HCT 41.9 07/27/2016 1450   PLT 323 07/13/2022 1057   MCV  84 07/13/2022 1057   MCV 86.0 07/27/2016 1450   MCH 27.6 07/13/2022 1057   MCH 27.2 04/20/2022 1517   MCHC 32.8 07/13/2022 1057   MCHC 31.5  04/20/2022 1517   RDW 13.6 07/13/2022 1057   RDW 13.8 07/27/2016 1450   LYMPHSABS 2.3 10/08/2018 1245   LYMPHSABS 1.9 07/27/2016 1450   MONOABS 0.5 10/08/2018 1245   MONOABS 0.4 07/27/2016 1450   EOSABS 0.1 10/08/2018 1245   EOSABS 0.1 07/27/2016 1450   BASOSABS 0.1 10/08/2018 1245   BASOSABS 0.1 07/27/2016 1450    BMET    Component Value Date/Time   NA 143 07/26/2022 1111   NA 145 (H) 07/13/2022 1057   NA 140 07/27/2016 1450   K 4.2 07/26/2022 1111   K 3.4 (L) 07/27/2016 1450   CL 110 07/26/2022 1111   CO2 24 07/13/2022 1057   CO2 29 07/27/2016 1450   GLUCOSE 122 (H) 07/26/2022 1111   GLUCOSE 85 07/27/2016 1450   BUN 23 07/26/2022 1111   BUN 23 07/13/2022 1057   BUN 15.4 07/27/2016 1450   CREATININE 0.70 07/26/2022 1111   CREATININE 0.8 07/27/2016 1450   CALCIUM 9.6 07/13/2022 1057   CALCIUM 9.8 07/27/2016 1450   GFRNONAA >60 04/20/2022 1517   GFRAA >60 01/14/2020 0840    BNP    Component Value Date/Time   BNP 90.4 04/20/2022 1517    ProBNP No results found for: "PROBNP"  Imaging: ECHOCARDIOGRAM COMPLETE  Result Date: 01/19/2023    ECHOCARDIOGRAM REPORT   Patient Name:   Alyssa [name] Date of Exam: 01/19/2023 Medical Rec #:  [id number]      Height:       62.0 in Accession #:    [id number]     Weight:       203.6 lb Date of Birth:  04-Mar-1951      BSA:          1.926 m Patient Age:    72 years       BP:           173/93 mmHg Patient Gender: F              HR:           60 bpm. Exam Location:  Outpatient Procedure: 2D Echo, Cardiac Doppler, Color Doppler and Strain Analysis Indications:    DOE (dyspnea on exertion)  History:        Patient has prior history of Echocardiogram examinations, most                 recent 04/25/2022. Signs/Symptoms:Dyspnea. Breast Cancer (Left                 breast ).  Sonographer:    Lucendia Herrlich Referring Phys: [id number] [facility] [place] IMPRESSIONS  1. Left ventricular ejection fraction, by estimation, is 60 to 65%. The left ventricle  has normal function. The left ventricle has no regional wall motion abnormalities. Left ventricular diastolic parameters were normal.  2. Right ventricular systolic function is normal. The right ventricular size is normal. There is normal pulmonary artery systolic pressure. The estimated right ventricular systolic pressure is 28.0 mmHg.  3. Left atrial size was mildly dilated.  4. The mitral valve is grossly normal. Mild to moderate mitral valve regurgitation.  5. The aortic valve is tricuspid. There is moderate calcification of the aortic valve. There is moderate thickening of the aortic valve. Aortic valve regurgitation is not visualized. Aortic valve mean gradient measures 12.0  mmHg. Aortic valve Vmax measures 2.24 m/s.  6. The inferior vena cava is normal in size with greater than 50% respiratory variability, suggesting right atrial pressure of 3 mmHg. Comparison(s): Prior TTE 04/2022 with mAoV gradient , Vmax 2.4m/s. Overall, no significant change. FINDINGS  Left Ventricle: Left ventricular ejection fraction, by estimation, is 60 to 65%. The left ventricle has normal function. The left ventricle has no regional wall motion abnormalities. Global longitudinal strain performed but not reported based on interpreter judgement due to suboptimal tracking. The left ventricular internal cavity size was normal in size. There is no left ventricular hypertrophy. Left ventricular diastolic parameters were normal. Right Ventricle: The right ventricular size is normal. No increase in right ventricular wall thickness. Right ventricular systolic function is normal. There is normal pulmonary artery systolic pressure. The tricuspid regurgitant velocity is 2.50 m/s, and  with an assumed right atrial pressure of 3 mmHg, the estimated right ventricular systolic pressure is 28.0 mmHg. Left Atrium: Left atrial size was mildly dilated. Right Atrium: Right atrial size was normal in size. Pericardium: There is no evidence of  pericardial effusion. Mitral Valve: The mitral valve is grossly normal. Mild to moderate mitral valve regurgitation. Tricuspid Valve: The tricuspid valve is normal in structure. Tricuspid valve regurgitation is trivial. Aortic Valve: The aortic valve is tricuspid. There is moderate calcification of the aortic valve. There is moderate thickening of the aortic valve. Aortic valve regurgitation is not visualized. Aortic valve mean gradient measures 12.0 mmHg. Aortic valve peak gradient measures 20.1 mmHg. Aortic valve area, by VTI measures 1.72 cm. Pulmonic Valve: The pulmonic valve was normal in structure. Pulmonic valve regurgitation is trivial. Aorta: The aortic root and ascending aorta are structurally normal, with no evidence of dilitation. Venous: The inferior vena cava is normal in size with greater than 50% respiratory variability, suggesting right atrial pressure of 3 mmHg. IAS/Shunts: The atrial septum is grossly normal.  LEFT VENTRICLE PLAX 2D LVIDd:         4.20 cm   Diastology LVIDs:         3.00 cm   LV e' medial:    5.55 cm/s LV PW:         0.80 cm   LV E/e' medial:  15.1 LV IVS:        0.80 cm   LV e' lateral:   9.25 cm/s LVOT diam:     2.00 cm   LV E/e' lateral: 9.1 LV SV:         76 LV SV Index:   39 LVOT Area:     3.14 cm  RIGHT VENTRICLE             IVC RV S prime:     12.80 cm/s  IVC diam: 1.30 cm TAPSE (M-mode): 2.1 cm LEFT ATRIUM             Index        RIGHT ATRIUM           Index LA diam:        4.50 cm 2.34 cm/m   RA Area:     14.60 cm LA Vol (A2C):   75.0 ml 38.94 ml/m  RA Volume:   33.70 ml  17.50 ml/m LA Vol (A4C):   69.1 ml 35.87 ml/m LA Biplane Vol: 74.8 ml 38.83 ml/m  AORTIC VALVE                     PULMONIC VALVE AV Area (  Vmax):    1.56 cm      PR End Diast Vel: 3.61 msec AV Area (Vmean):   1.73 cm AV Area (VTI):     1.72 cm AV Vmax:           224.00 cm/s AV Vmean:          130.400 cm/s AV VTI:            0.442 m AV Peak Grad:      20.1 mmHg AV Mean Grad:      12.0 mmHg  LVOT Vmax:         111.00 cm/s LVOT Vmean:        71.700 cm/s LVOT VTI:          0.242 m LVOT/AV VTI ratio: 0.55  AORTA Ao Root diam: 2.90 cm Ao Asc diam:  3.10 cm MITRAL VALVE               TRICUSPID VALVE MV Area (PHT): 3.77 cm    TR Peak grad:   25.0 mmHg MV Decel Time: 201 msec    TR Vmax:        250.00 cm/s MR Peak grad: 112.4 mmHg MR Vmax:      530.00 cm/s  SHUNTS MV E velocity: 83.90 cm/s  Systemic VTI:  0.24 m MV A velocity: 78.50 cm/s  Systemic Diam: 2.00 cm MV E/A ratio:  1.07 Laurance Flatten MD Electronically signed by Laurance Flatten MD Signature Date/Time: 01/19/2023/3:49:48 PM    Final      Assessment & Plan:   Dyspnea on exertion - Exact etiology is unclear, she is being treated for presumed asthma/UACS. She has had persistent shortness of breath since September/October 2023, no better after cardiac ablation for afib in January 2024. She has some associated wheezing symptoms and cough. She is compliant with Flovent hfa 2 puffs daily and GERD medication. I'd like her to hold her Adderall for 2-3 days to see if this could be contributing to perceived shortness of breath. If not better, trial Advair disc 250-51mcg 1 puff twice daily. FU with Dr. Vassie Loll first available in [facility].   Glenford Bayley, NP 01/30/2023

## 2023-01-30 NOTE — Patient Instructions (Addendum)
Recommendations: - If you can, hold Adderall for 2-3 days to see if this helps shortness of breath - Start Advair (fluticasone-salmeterol) 1 puff daily in morning and evening  - Stop Flovent while taking Advair  Follow-up:  - First available with Dr. Vassie Loll in [place]

## 2023-01-30 NOTE — Assessment & Plan Note (Addendum)
-   Exact etiology is unclear, she is being treated for presumed asthma/UACS. She has had persistent shortness of breath since September/October 2023, no better after cardiac ablation for afib in January 2024. She has some associated wheezing symptoms and cough. She is compliant with Flovent hfa 2 puffs daily and GERD medication. I'd like her to hold her Adderall for 2-3 days to see if this could be contributing to perceived shortness of breath. If not better, trial Advair disc 250-48mcg 1 puff twice daily. FU with Dr. Vassie Loll first available in [facility].

## 2023-02-08 ENCOUNTER — Encounter: Payer: Self-pay | Admitting: Nurse Practitioner

## 2023-03-03 ENCOUNTER — Other Ambulatory Visit: Payer: Self-pay | Admitting: General Practice

## 2023-03-15 ENCOUNTER — Other Ambulatory Visit: Payer: Self-pay | Admitting: Family Medicine

## 2023-03-15 DIAGNOSIS — Z780 Asymptomatic menopausal state: Secondary | ICD-10-CM

## 2023-03-19 ENCOUNTER — Ambulatory Visit: Payer: Medicare PPO | Admitting: Adult Health

## 2023-03-19 ENCOUNTER — Encounter: Payer: Self-pay | Admitting: Adult Health

## 2023-03-19 VITALS — BP 152/93 | HR 90 | Ht 62.0 in | Wt 206.0 lb

## 2023-03-19 DIAGNOSIS — R519 Headache, unspecified: Secondary | ICD-10-CM

## 2023-03-19 MED ORDER — DIVALPROEX SODIUM ER 500 MG PO TB24
500.0000 mg | ORAL_TABLET | Freq: Every day | ORAL | 5 refills | Status: AC
Start: 2023-03-19 — End: ?

## 2023-03-19 NOTE — Progress Notes (Signed)
I agree with the above plan 

## 2023-03-19 NOTE — Progress Notes (Signed)
[facility]  PATIENT: Alyssa [name] DOB: Jun 24, 1951   REASON FOR VISIT: New onset headache HISTORY FROM: Patient   Chief complaint:  Chief Complaint  Patient presents with   Follow-up    Patient in room #8 and alone. Patient states she still been having blinding headaches.      HISTORY OF PRESENT ILLNESS:   Update 03/19/2023 JM: Patient returns for follow-up visit due to return of headaches.  Previously seen 10/2021 and reported occasional mild type headaches without prophylactic therapy.  Reports new onset headache in July, present left periorbital, frontal and temporal. At first, only present at night but over the past month, headaches have been daily, rates 10/10 pain level.  Difficulty to look at screens, has photophobia and phonophobia. No current N/V. Feels different from previously experienced headaches.  Still some mild short-term memory difficulties but stable.  She has had some decline in left eye vision, does have known cataract, was seen by ophthalmology ([facility] Dr. Lorin Picket) in June with some worsening of cataract and possible need of surgery.  She has been using Excedrin and Tylenol at least 4 times per day over the past month but denies any benefit, use of ice without benefit.  Denies any recent medication changes.  Routinely followed by psychiatry currently on doxepin for insomnia and Fetzima for depression.  Reports diabetes under good control on Ozempic, recent A1c 5.6.  Routinely follows with cardiology for A-fib currently on Eliquis.     History provided for reference purposes only Update 11/08/2021 JM: patient returns for 6 month follow up. Overall stable since prior visit with Dr. Pearlean Brownie. She has not had any additional severe headaches since prior visit. She will get occasional mild tension headaches approx 2x per week. Occasionally will need to stop what she is doing, will use Excedrin migraine with benefit.  She continues to remain off  Depakote which she was previously on for headaches.  Has noticed mood fluctuation since discontinuing, she is scheduled to see a psychiatrist next week to further discuss. Balance has been stable since prior visit, no recent falls.  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  No new concerns at this time.  Update 04/27/2021 Dr. Pearlean Brownie : She returns for follow-up after last visit 2 months ago with Shanda Bumps nurse practitioner.  Patient did not tolerate amitriptyline as she felt disoriented and had brain fog and discontinued it.  She has also stopped Depakote more than a month ago with headaches in fact seem to be improving.  Headaches are not as frequent and occur rarely once a week or so she is scared to take any medicines.  Headaches are moderate in severity and generalized aching in nature with some posterior neck tightness.  Headache is not accompanied by nausea, vomiting, light or sound sensitivity or any visual symptoms.  Patient feels her balance is also better she has had no falls or injuries.  She had MRI scan of the brain done on 03/11/2021 which I personally reviewed shows mild changes of chronic small vessel disease and a partially empty sella.  No acute abnormalities noted.  She had cholesterol profile on 02/22/2021 which showed elevated total cholesterol of 250, LDL of 184 and low HDL of 49.  Hemoglobin A1c was 6.1.  Her cardiologist Dr. Rennis Golden has started her on Repatha injections which she is tolerating well without any side effects.  She has no new complaints today.  Update 02/28/2021 JM: Alyssa [name] returns for routine 60-month follow-up.  Since prior visit, she reports having daily headaches as well as fatigue, imbalance and difficulty with word recall over the past 2 weeks - questions possible medication side effect to new BP meds started approx 1 month ago (added chlorthalidone and lowered amlodipine dose to 5mg  due to edema). Plans to discuss further with Dr. Rennis Golden.  Does report improvement  in her blood pressure - today 144/88 previously SBPs 180-200s.  Reports some issues with speech initially after head injury which improved.  Cognition has been stable and continues to use compensation strategies although she is concerned regarding possible progression.  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  Reports difficulty with balance over the past month especially with use of steps or curbs or uneven ground (such as in the yard). Reports 2 falls thankfully without injury.  Generalized headache present daily which lasts all day. Can increase in severity at times and will take Tylenol with only limited benefit.  Denies photophobia or phonophobia.  Continues on Depakote but concerned regarding increase liver enzymes (AST 84, ALT 124).  No further concerns at this time.  Update 08/25/2020 JM: Alyssa [name] returns for 17-month follow-up with history of postconcussion syndrome.  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 She has remained on depakote ER 500mg  nightly but reports since November she has been experiencing occasional headaches upon awakening. She will take tylenol with occasional benefit.  She also reports insomnia.  Reports previously undergoing sleep study approximately 10 years ago and was negative for sleep apnea.  She denies snoring or any witnessed apnea.  She has been having difficulty with blood pressure control and PCP recently switched hydrochlorothiazide to losartan.  Blood pressure today 168/92 -occasionally monitors at home.  PCP is also working her up for elevated liver enzymes and has scheduled ultrasound 2/7.  No further concerns at this time.  Update 02/23/2020 JM: Alyssa [name] returns for postconcussion syndrome with memory loss and headaches.  Continues on Depakote ER 500 mg nightly with benefit of headache management without side effects.  Memory loss has been stable without worsening.  Since prior visit, underwent uncomplicated cervical spinal fusion  on 01/16/2020.  Also started on Adderall by PCP for possible ADHD and mood swings with great improvement.  No concerns at this time.  Update 08/26/2019 JM: Alyssa [name] is a 72 year old female who is being seen today for 1 year follow-up regarding postconcussion syndrome with memory loss and headaches.  At prior visit, she was doing well in regards to stable headaches and memory.  She endorses worsening headaches since the beginning of December which are generalized associated with photophobia and phonophobia with similar characteristics compared to her prior headaches.  She has been using Tylenol 1000 mg twice daily with only mild benefit.  She does endorse increased stress as she is a high school special ed teacher and had difficulty with the virtual learning as she felt "isolated".  She has now since returned to in class learning and has been feeling better since that time but she continues to experience short-term memory loss and is concerned regarding overall worsening memory. MMSE today 29/30.  She continues to use Depakote DR 500 mg nightly but previously on ER formulation.  She denies depression or anxiety and denies any history or family history.  She does endorse increased stress.  Recently started on Repatha by cardiology with excellent improvement of cholesterol levels.  Blood pressure today satisfactory at 132/84.  No further concerns at this time.  UPDATE 1/2/2020[name], 72 year old female returns for follow-up with history of memory loss postconcussion syndrome and headaches.  She is currently on Depakote 500 mg at night with good control of her headaches however she does wake up with some morning headaches occasionally.  She occasionally takes Tylenol with relief.  She denies any daytime drowsiness or snoring.  She claims she feels the best she has in a long time she is exercising.  She continues to work she teaches in [place] special education.  She denies any issues with her job performance.   Her mother recently died.  She returns for reevaluation  Update 12/20/2018[name], 72 year old female returns for follow-up with history of headaches and postconcussion syndrome and memory loss.  For her headaches she is taking Depakote but decided to taper herself off of the medication back in the summer, her headaches returned and she resumed the Depakote.  Her headaches are now in good control her memory loss is stable per MMSE.  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  She has not had no issues performing her job.  She returns for reevaluation and refills  UPDATE 07/19/16 [name], 72 year old female returns for yearly followup.  Patient fell off of the chair at school back in June2015 and hit her head. CT of the head at that time without abnormalities her headaches returned and she started taking her Depakote again with good benefit. Her memory is stable per MMSE. 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  She has not had any issues performing  her job.  She had breast cancer surgery January 2015. She had knee replacement on the right 12/22/2015 She returns for reevaluation and refills  HISTORY: of cognitive changes post  head injury. She was initially evaluated by Dr. Pearlean Brownie in September of 2009. She was operating a push mower when she was going down a ditch in front of her home and  toppled over the handle and hit her head on the motor of the mower.  At that time she was not sure she loss consciousness or not,she was able to get up go indoors. She did not seek medical help that day, however she did see her primary care the next day when she was complaining of severe headache, appeared to be confused and disoriented and did not recognize the Dr. CT of the head showed no acute abnormality. She has since had an MRI of the brain which was read as normal as well as an EEG  that was normal.  She returns today continuing to complain with short-term memory, attention, and  concentration difficulties but she continues to work and she has not had any difficulty performing her job duties.  She says she manages by using sticky notes, so that she will not forget things.  She denies any depression ,she is trying to exercise and has lost weight since last seen. She does complain of early morning headaches and fatigue, does not feel rested after sleeping although that is better with Trazodone.  She is currently on Depakote 500 daily 2 daily for headaches.Her headaches have returned since she is back to teaching after being off for the summer. She says these are stress related. She does not want to increase medication.    There is no nausea, photophobia, with the headaches. 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  Fell several weeks ago and hit the back of the head. No obvious injury.  See ROS  07/18/12: Returns for followup. MMSE stable. Went off Depakote for a few weeks and headaches returned and mood was terrible. Her PCP gave her a RX to get back on the Depakote. Headaches are stable at present. Continues to work with out problems with performing job. Had hernia repair in March and complication of PE after surgery. Has just gotten off coumadin. No new complaints   REVIEW OF SYSTEMS: Full 14 system review of systems performed and notable only for those listed, all others are neg:  Constitutional: neg  Cardiovascular: neg Ear/Nose/Throat: neg  Skin: neg Eyes: neg Respiratory: neg Gastroitestinal: neg  Hematology/Lymphatic: neg  Endocrine: neg Musculoskeletal:neg Allergy/Immunology: neg Neurological:  headaches Psychiatric: neg Sleep : neg   ALLERGIES: Allergies  Allergen Reactions   Aleve [Naproxen Sodium] Hives, Itching and Swelling   Aromasin [Exemestane] Anaphylaxis, Itching and Swelling    Pt currently tolerating medication well (02/03/16)-gwd; Pt  stated "I can take this medication for a few months at a time and I take a benadryl when I feel it starting to come on" Patient has facial swelling, itching, and throat swelling  07/12/17  Taking this right now and no problem. sy/rn   Letrozole Shortness Of Breath, Rash and Other (See Comments)    Swelling - feet, eyes,hands;   Speech garbled   Levofloxacin Rash and Swelling    Facial swelling and red facial rash   Penicillins Hives, Swelling and Rash    All over the body. Has patient had a PCN reaction causing immediate rash, facial/tongue/throat swelling, SOB or lightheadedness with hypotension: Yes Has patient had a PCN reaction causing severe rash involving mucus membranes or skin necrosis: No Has patient had a PCN reaction that required hospitalization No Has patient had a PCN reaction occurring within the last 10 years: No If all of the above answers are "NO", then may proceed with Cephalosporin use.    Symbicort [Budesonide-Formoterol Fumarate] Anaphylaxis, Hives, Swelling and Rash   Codeine Other (See Comments)    hallucinations   Percocet [Oxycodone-Acetaminophen] Other (See Comments)    Causes syncope day after medication has been taken.   Vicodin [Hydrocodone-Acetaminophen] Other (See Comments)    Causes syncope day after medication has been taken.   Doxycycline Swelling    Must with  antihistamine   Ezetimibe Other (See Comments)    Myalgias,fatigue   Prednisone Other (See Comments)    Can take with antihistamine/ can take the shot without antihistamine   Cefdinir Rash and Swelling   Losartan Rash    HOME MEDICATIONS: Outpatient Medications Prior to Visit  Medication Sig Dispense Refill   albuterol (VENTOLIN HFA) 108 (90 Base) MCG/ACT inhaler Inhale 2 puffs into the lungs every 6 (six) hours as needed for wheezing or shortness of breath.      amLODipine (NORVASC) 5 MG tablet Take 1 tablet (5 mg total) by mouth in the morning AND 1 tablet (5 mg total) every evening. 180  tablet 3   amphetamine-dextroamphetamine (ADDERALL) 20 MG tablet Take 20 mg by mouth 2 (two) times daily. Take 20mg  in the morning and 20mg  in the afternoon.     cetirizine (ZYRTEC) 10 MG tablet Take 10 mg by mouth daily.     cholecalciferol (VITAMIN D3) 25 MCG (1000 UNIT) tablet Take 1,000 Units by mouth daily.     Doxepin HCl 6 MG TABS Take 1 tablet by mouth at bedtime.  ELIQUIS 5 MG TABS tablet TAKE 1 TABLET(5 MG) BY MOUTH TWICE DAILY 60 tablet 11   Evolocumab (REPATHA SURECLICK) 140 MG/ML SOAJ INJECT 1 PEN INTO THE SKIN EVERY 14 DAYS 2 mL 11   fluticasone-salmeterol (ADVAIR) 250-50 MCG/ACT AEPB Inhale 1 puff into the lungs every 12 (twelve) hours. 60 each 2   levothyroxine (SYNTHROID) 112 MCG tablet Take 100 mcg by mouth daily before breakfast.     metoprolol succinate (TOPROL-XL) 25 MG 24 hr tablet TAKE 1/2 TABLET(12.5 MG) BY MOUTH DAILY. MAY TAKE EXTRA 1/2 TABLET FOR SUSTAINED HEART RATE>100 FOR 45 MINUTES 45 tablet 3   omeprazole (PRILOSEC) 40 MG capsule TAKE 1 CAPSULE BY MOUTH DAILY 30 capsule 0   potassium chloride (KLOR-CON) 10 MEQ tablet TAKE 1 TABLET(10 MEQ) BY MOUTH DAILY 90 tablet 3   Semaglutide, 2 MG/DOSE, (OZEMPIC, 2 MG/DOSE,) 8 MG/3ML SOPN Inject 2 mg into the skin every Sunday.     vitamin E 180 MG (400 UNITS) capsule Take 400 Units by mouth daily.     Dextromethorphan-Bupropion (AUVELITY PO) Take 1 tablet by mouth 2 (two) times daily.     No facility-administered medications prior to visit.    PAST MEDICAL HISTORY: Past Medical History:  Diagnosis Date   Abdominal pain    Anxiety    Arthritis    Asthma    Breast cancer (HCC) 06/2013   left upper inner   Cancer (HCC)    breast   Diabetes mellitus without complication (HCC)    Takes Tradjenta   Family history of adverse reaction to anesthesia    Patients mother had to be resusciated after having anesthesia   GERD (gastroesophageal reflux disease)    Headache(784.0)    Takes Depakote   Hernia, incisional  periumbilical, incarcerated 08/07/2011   Hot flashes    Hx of radiation therapy 09/09/13- 10/13/13   left breast 5000 cGy in 25 sessions, no boost   Hyperlipidemia    Hypertension    PCP DR Creta Levin   Hypothyroidism    Non-alcoholic fatty liver disease    Peripheral vascular disease (HCC)    pulmonary embolis-still have some   Post concussion syndrome    4 yrs ago- sees [facility]- Dr Pearlean Brownie every 6 months-   has sleep study 2011 following accident but was neg per patient-  short term memory issues, dates and times   Pulmonary embolism (HCC) 2013   Recurrent upper respiratory infection (URI)    FLU 08/23/11-08/29/11 then URI 08/29/11- 09/08/11- S/P zpack and steroids-  improved      no cough or congestion   Thyroid disease    Thyroid mass, left inferior lobe, 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  Dr. Daphine Deutscher   BREAST BIOPSY     BREAST LUMPECTOMY Left 2015   radiation   BREAST LUMPECTOMY WITH NEEDLE LOCALIZATION AND AXILLARY SENTINEL LYMPH NODE BX Left 08/04/2013   Procedure: BREAST LUMPECTOMY WITH NEEDLE LOCALIZATION AND AXILLARY SENTINEL LYMPH NODE Biopsy x 3;  Surgeon: Emelia Loron, MD;  Location: [facility] OR;  Service: General;  Laterality: Left;   BREAST SURGERY     CARDIOVERSION N/A 07/26/2022   Procedure: CARDIOVERSION;  Surgeon: Maisie Fus, MD;  Location: [facility] ENDOSCOPY;  Service: Cardiovascular;  Laterality: N/A;   CARPAL TUNNEL RELEASE  10/2009-12/2010   left - right   CESAREAN SECTION  X 2   COLONOSCOPY     DIAGNOSTIC LAPAROSCOPY     Lap. chole., appendectomy, hernia repair   HERNIA REPAIR  09/15/11   Ventral w/mesh   JOINT  REPLACEMENT     KNEE ARTHROPLASTY Right 12/22/2015   Procedure: COMPUTER ASSISTED TOTAL KNEE ARTHROPLASTY;  Surgeon: Eldred Manges, MD;  Location: [facility] OR;  Service: Orthopedics;  Laterality: Right;   LAPAROSCOPIC CHOLECYSTECTOMY  2006   Dr. Randon Goldsmith TUNNEL RELEASE Bilateral    THYROIDECTOMY, PARTIAL     VENTRAL HERNIA REPAIR  09/29/2011   Procedure: LAPAROSCOPIC VENTRAL HERNIA;  Surgeon: Ardeth Sportsman, MD;  Location: [facility] ORS;  Service: General;  Laterality: N/A;  Laparoscopic Ventral Wall Hernia Repair with Mesh    FAMILY HISTORY: Family History  Problem Relation Age of Onset   Malignant hyperthermia Mother    Heart attack Father    Stroke Father    Diabetes Other        Grandmother   Other Other        respiratory- Grandmother   Cancer Maternal Grandmother 13       breast   Breast cancer Maternal Grandmother     SOCIAL HISTORY: Social History   Socioeconomic History   Marital status: Divorced    Spouse name: Not on file   Number of children: 2   Years of education: Master's   Highest education level: Not on file  Occupational History    Employer: [employer]  Tobacco Use   Smoking status: Never   Smokeless tobacco: Never  Vaping Use   Vaping status: Never Used  Substance and Sexual Activity   Alcohol use: No    Alcohol/week: 1.0 standard drink of alcohol    Types: 1 Glasses of wine per week   Drug use: No   Sexual activity: Yes    Comment: Menarche age 36, first live birth age 37,  GX P2. She stopped having periods approximately 1999 and took HRT until 2005.  Other Topics Concern   Not on file  Social History Narrative   Patient is divorced and lives alone.   Patient drinks 3-4 cups of caffeine daily.    Patient is right handed.   Social Determinants of Health   Financial Resource Strain: Low Risk  (04/05/2022)   Received from [facility], [facility]   Overall Financial Resource Strain (CARDIA)    Difficulty of Paying Living Expenses: Not  hard at all  Food Insecurity: Low Risk  (03/09/2023)   Received from [facility]   Food vital sign    Within the past 12 months, you worried that your food would run out before you got money to buy more: Never true    Within the past 12 months, the food you bought just didn't last and you didn't have money to get more. : Never true  Transportation Needs: Not on file (03/09/2023)  Physical Activity: Sufficiently Active (02/25/2022)   [facility], [facility] visits prior to 09/23/2022., [facility], [facility] visits prior to 09/23/2022.   Exercise Vital Sign    Days of Exercise per Week: 4 days    Minutes of Exercise per Session: 40 min  Stress: Stress Concern Present (02/25/2022)   Received from [facility]  [facility] visits prior to 09/23/2022., [facility] visits prior to 09/23/2022.   [employer] of Occupational Health - Occupational Stress Questionnaire    Feeling of Stress : Rather much  Social Connections: Socially Integrated (02/25/2022)   Received from [facility] visits prior to 09/23/2022., [facility], [facility], [facility] visits prior to 09/23/2022.   Social Advertising account executive [NHANES]    Frequency of Communication with Friends and Family: More than three times a week    Frequency of Social Gatherings with Friends and Family: More than three times a week    Attends Religious Services: More than 4 times per year    Active Member of [employer] or Organizations: Yes    Attends Banker Meetings: More than 4 times per year    Marital Status: Living with partner  Intimate Partner Violence: Not At Risk (02/25/2022)   [facility] from [facility] visits prior to 09/23/2022., [facility] visits prior to 09/23/2022.   Humiliation, Afraid, Rape, and Kick questionnaire     Fear of Current or Ex-Partner: No    Emotionally Abused: No    Physically Abused: No    Sexually Abused: No     PHYSICAL EXAM  Vitals:   03/19/23 0746  BP: (!) 152/93  Pulse: 90  Weight: 206 lb (93.4 kg)  Height: 5\' 2"  (1.575 m)   Body mass index is 37.68 kg/m.  Generalized: Well developed, pleasant very pleasant middle-aged female in no acute distress   Head: normocephalic and atraumatic,. Oropharynx benign   Musculoskeletal: No deformity   Neurological examination  Mentation: Awake and alert.  Follows all commands.  Speech and language fluent.  Short-term memory mildly impaired and long-term memory intact.  Attention and fund of knowledge appropriate. Cranial Nerves: Pupils equal, briskly reactive to light. Extraocular movements full without nystagmus. Visual fields full to confrontation. Hearing intact. Facial sensation intact. Face, tongue, palate moves normally and symmetrically.  Motor: Normal bulk and tone. Normal strength in all tested extremity muscles. Sensory.: intact to touch , pinprick , position and vibratory sensation.  Coordination: Rapid alternating movements normal in all extremities. Finger-to-nose and heel-to-shin performed accurately bilaterally. Gait and Station: Arises from chair without difficulty. Stance is normal. Gait demonstrates normal stride length with mild imbalance without use of assistive device Reflexes: 1+ and symmetric. Toes downgoing.        ASSESSMENT AND PLAN  72 y.o. year old female with new onset daily left frontal/temporal headaches over the past 2 months. History of concussion in 2015 with postconcussive syndrome with residual mild headaches but current headache different from previously experienced headaches.     New onset left frontal/temporal headache:  Obtain MRI brain and MR venogram for further evaluation Obtain ESR/CRP to rule out temporal arteritis although low suspicion Discussed medication management -previously on  Depakote for headache prophylaxis but eventually discontinued due to concern of slightly elevated LFTs. She wishes to retry Depakote and is scheduled for LFTs next month with PCP - she is aware of increased risk of elevated LFT's on medication, she will notify us if repeat levels show any elevation compared to prior levels in April (AST 30, ALT 47).  If unable to tolerate Depakote or no benefit, could consider trialing zonisamide, gabapentin or topiramate    Follow-up in 6 months or call earlier if needed    CC:   PCP: Rene Kocher,  Nira Retort, MD   [facility] provider: Dr. Pearlean Brownie   I spent 40 minutes of face-to-face and non-face-to-face time with patient.  This included previsit chart review, lab review, study review, electronic health record documentation, patient education and discussion regarding postconcussive syndrome and current mild headaches and answered all other questions to patient's satisfaction   Ihor Austin, [facility]  [facility] [address] [place], [place] [address]  Phone [phone] Fax [phone] Note: This document was prepared with digital dictation and possible smart phrase technology. Any transcriptional errors that result from this process are unintentional.

## 2023-03-19 NOTE — Patient Instructions (Addendum)
Your Plan:  Complete MRI brain  and MR venogram due to new onset headaches  We will check lab work today to rule out temporal arteritis   Restart Depakote - please ensure liver levels are being monitored - if they are elevated compared to prior level, please let me know and we can discuss trying other medications such as topamax, gabapentin or zonisamide   Limited use of Excedrin and tylenol as this could be contributing to a rebound (worsening) headache      Follow up in 6 months or call earlier if needed       Thank you for coming to see [place] at [facility]. I hope we have been able to provide you high quality care today.  You may receive a patient satisfaction survey over the next few weeks. We would appreciate your feedback and comments so that we may continue to improve ourselves and the health of our patients.

## 2023-03-20 LAB — SEDIMENTATION RATE: Sed Rate: 2 mm/h (ref 0–40)

## 2023-03-20 LAB — C-REACTIVE PROTEIN: CRP: <1 mg/L (ref 0–10)

## 2023-03-21 ENCOUNTER — Encounter: Payer: Self-pay | Admitting: Adult Health

## 2023-03-21 MED ORDER — ZONISAMIDE 50 MG PO CAPS: 50.0000 mg | ORAL_CAPSULE | Freq: Two times a day (BID) | ORAL | 5 refills | Status: DC

## 2023-03-22 ENCOUNTER — Other Ambulatory Visit (HOSPITAL_COMMUNITY): Payer: Self-pay | Admitting: Gastroenterology

## 2023-03-22 DIAGNOSIS — R1033 Periumbilical pain: Secondary | ICD-10-CM

## 2023-03-28 ENCOUNTER — Other Ambulatory Visit (HOSPITAL_COMMUNITY): Payer: Self-pay | Admitting: Gastroenterology

## 2023-03-28 DIAGNOSIS — R1319 Other dysphagia: Secondary | ICD-10-CM

## 2023-03-30 ENCOUNTER — Ambulatory Visit (HOSPITAL_COMMUNITY): Payer: Medicare PPO

## 2023-03-30 ENCOUNTER — Encounter (HOSPITAL_COMMUNITY): Payer: Self-pay

## 2023-04-01 ENCOUNTER — Encounter: Payer: Self-pay | Admitting: Adult Health

## 2023-04-02 ENCOUNTER — Ambulatory Visit (HOSPITAL_BASED_OUTPATIENT_CLINIC_OR_DEPARTMENT_OTHER): Payer: Medicare PPO | Admitting: Pulmonary Disease

## 2023-04-02 MED ORDER — ZONISAMIDE 100 MG PO CAPS: 100.0000 mg | ORAL_CAPSULE | Freq: Two times a day (BID) | ORAL | 5 refills | Status: DC

## 2023-04-02 NOTE — Telephone Encounter (Signed)
See note from 03/21/23.

## 2023-04-02 NOTE — Addendum Note (Signed)
Addended by: Ihor Austin L on: 04/02/2023 01:29 PM   Modules accepted: Orders

## 2023-04-03 ENCOUNTER — Ambulatory Visit (HOSPITAL_COMMUNITY)
Admission: RE | Admit: 2023-04-03 | Discharge: 2023-04-03 | Disposition: A | Discharge status: Home / Self Care | Payer: Medicare PPO | Source: Ambulatory Visit | Attending: Gastroenterology | Admitting: Gastroenterology

## 2023-04-03 DIAGNOSIS — R1033 Periumbilical pain: Secondary | ICD-10-CM | POA: Insufficient documentation

## 2023-04-03 MED ORDER — IOHEXOL 350 MG/ML SOLN
75.0000 mL | Freq: Once | INTRAVENOUS | Status: AC | PRN
  Administered 2023-04-03: 75 mL via INTRAVENOUS

## 2023-04-04 ENCOUNTER — Telehealth: Payer: Self-pay | Admitting: Adult Health

## 2023-04-04 LAB — POCT I-STAT CREATININE: Creatinine, Ser: 0.9 mg/dL (ref 0.44–1.00)

## 2023-04-04 NOTE — Telephone Encounter (Signed)
MRI brain [employer] auth: [id number] exp. 03/19/23-05/18/23 sent to GI [phone]  MRV head was denied

## 2023-04-05 ENCOUNTER — Other Ambulatory Visit (HOSPITAL_COMMUNITY): Payer: Self-pay | Admitting: Gastroenterology

## 2023-04-05 ENCOUNTER — Ambulatory Visit (HOSPITAL_COMMUNITY)
Admission: RE | Admit: 2023-04-05 | Discharge: 2023-04-05 | Disposition: A | Discharge status: Home / Self Care | Payer: Medicare PPO | Source: Ambulatory Visit | Attending: Gastroenterology | Admitting: Gastroenterology

## 2023-04-05 DIAGNOSIS — R1319 Other dysphagia: Secondary | ICD-10-CM | POA: Insufficient documentation

## 2023-04-27 ENCOUNTER — Ambulatory Visit: Payer: Medicare PPO | Attending: Internal Medicine | Admitting: Internal Medicine

## 2023-04-27 ENCOUNTER — Encounter: Payer: Self-pay | Admitting: Internal Medicine

## 2023-04-27 VITALS — BP 144/94 | HR 71 | Ht 62.0 in | Wt 211.0 lb

## 2023-04-27 DIAGNOSIS — R0602 Shortness of breath: Secondary | ICD-10-CM | POA: Diagnosis not present

## 2023-04-27 DIAGNOSIS — I2510 Atherosclerotic heart disease of native coronary artery without angina pectoris: Secondary | ICD-10-CM

## 2023-04-27 DIAGNOSIS — I480 Paroxysmal atrial fibrillation: Secondary | ICD-10-CM

## 2023-04-27 DIAGNOSIS — I10 Essential (primary) hypertension: Secondary | ICD-10-CM | POA: Diagnosis not present

## 2023-04-27 DIAGNOSIS — Z79899 Other long term (current) drug therapy: Secondary | ICD-10-CM

## 2023-04-27 MED ORDER — SPIRONOLACTONE 25 MG PO TABS: 25.0000 mg | ORAL_TABLET | Freq: Every day | ORAL | 3 refills | Status: DC

## 2023-04-27 NOTE — Progress Notes (Signed)
LIPID CLINIC CONSULT NOTE  Chief Complaint:  Follow-up shortness of breath  Primary Care Physician: Gwenlyn Found, MD  HPI:  Alyssa [name] is a 72 y.o. female who is being seen today for the evaluation of dyslipidemia at the request of Eksir, Nira Retort, MD.  This is a very pleasant 72 year old female with a significant history of dyslipidemia.  She first noted she had elevated cholesterol in her 35s.  She has a strong family history of elevated cholesterol and heart disease.  Both of her brothers are age 43 and 65 and have elevated cholesterols over 400.  Her mom also had elevated cholesterol and her father had heart disease and died at age 17.  Unfortunately she has been intolerant to statins and is been on numerous statins in the past including Crestor, Lipitor, lovastatin and ezetimibe.  She also has nonalcoholic fatty liver disease.  Most recently her total cholesterol was 343, triglycerides 154, HDL of 40 and LDL of 272.  This is after losing 40 pounds recently although she is still moderately obese on the ketogenic diet.  She is also been taking Crestor but had significant pain with it and then finally discontinued it.  She has no known coronary disease although did have a pulmonary embolus in 2013.  I personally reviewed her CT scan which does show multivessel coronary artery calcification.  Is a history of breast cancer, diabetes which has resolved with weight loss, and postconcussive syndrome.  She reports that she had had a fairly atherogenic diet however recently again switched ketogenic diet and is cut out a lot of "bad saturated fats".  03/13/2019  Alyssa [name] returns today for follow-up.  Recently had repeat lipids which showed marked improvement in numbers.  Total cholesterol is now 168 with triglycerides 132, HDL 52 and LDL of 90.  He is tolerating Praluent without any side effects.  Overall she is very pleased with her improved cholesterol numbers.  10/17/2019  Alyssa [name]  returns today for follow-up.  Overall she is doing well.  Her repeat lipid recently showed total cholesterol 151, triglycerides 142, HDL 51 and LDL 75.  This is slightly improved and she has subsequently switched from Praluent to Repatha due to a change in her primary insurance.  Overall she is pleased with the medication.  12/01/2020  Alyssa [name] continues to have good control of her dyslipidemia.  Total cholesterol 150, HDL 52, triglycerides 133 and LDL 75.  She is tolerating Repatha well.  Unfortunately, she has had issues with elevated blood pressure.  She reports her primary has tried a number of different medications but they have either not gotten her to target or have been associated with side effects.  More recently she was switched off of the diuretic and onto amlodipine.  Initially this was 5 mg but then increased to 7.5 mg daily.  Since then she has had more lower extremity edema.  Blood pressure remains uncontrolled now 148/82 but improved as it was previously between 180 and 200 systolic.  She is asked for my assistance in managing her blood pressure.  06/23/2021  Alyssa [name] returns today for follow-up.  Unfortunately she became intolerant to the chlorthalidone was having a lot of side effects.  He may have been related to some hypokalemia.  She stopped her chlorthalidone and her symptoms improved.  Her blood pressure has been maintained on amlodipine.  She had lab work this summer in August through her PCP which showed a direct LDL 184, total  cholesterol 250 and triglycerides 209.  This is much higher than it previously had been in March with a total cholesterol 150 and LDL 75.  She reports compliance with the medication so is difficult to understand why the cholesterol was so much higher.  I think this has to be reassessed  01/04/2022  Alyssa [name] is seen today in follow-up.  Fortunately her lipids have responded again to Repatha.  Total cholesterol now 114, glycerides 104, HDL 54 and LDL 41.   She reports that her blood pressure still is uncontrolled.  She had been started on amlodipine 5 mg daily.  Blood pressures at home range between 140-160 systolic over 80.  She needs additional blood pressure lowering.  She has had some medicine intolerances which make this difficult including rash with an ARB.  Heart rate was noted to be low today however shows a sinus bradycardia at 57.  She says that she gets occasional palpitations recently and noted her heart rate has been jumping up for no reason.  She does wear an Apple Watch and has done EKGs of this.  She did not pay much attention to it but did note that the EKG interpretations on the Apple Watch suggested atrial fibrillation.  I reviewed these today and indeed she is having multiple episodes of atrial fibrillation.  This is a new diagnosis.  Her CHA2DS2-VASc score is at least 3 for age, female sex and hypertension.  07/13/2022  Alyssa [name] is seen today in follow-up.  Her blood pressure was surprisingly elevated today 164/110.  EKG shows a new onset atrial flutter with variable ventricular response.  She noted her heart rate was elevated but was not aware that she was out of rhythm.  She has felt more fatigued recently.  She denies any chest pain.  Fortunately she is already anticoagulated on Eliquis and reports compliance with the medication.  She has been very compliant with her Repatha and has had marked improvement in her lipids.  Total cholesterol 114, HDL 54, triglycerides 104 and LDL 41.  11/06/2022  Alyssa [name] returns today for follow-up.  She had recent repeat lipids which look pretty good however are increased compared to last June.  Total cholesterol now 155, triglycerides 146, HDL 56 and LDL 76.  EKG today shows sinus rhythm at 67.  She tells me that she has been having some recent increase in shortness of breath and fatigue.  She did have a successful cardioversion at the end of last year but then had recurrent atrial fibrillation and  flutter with multiple occurrences that were picked up on her Apple Watch.  She was advised to take extra metoprolol as needed for these things however she says her symptoms have been worsening.  It was noted that she does have early onset significant heart disease in multiple family members and was previously noted to have multivessel coronary calcification on CT.  I am concerned about the possibility that her shortness of breath is an anginal equivalent.  04/27/2023  Alyssa [name] is seen today for follow-up.  She reports ongoing shortness of breath.  I did send her for CT coronary angiography to rule out an anginal equivalent.  This showed minimal nonobstructive coronary disease calcium score 56, 64th percentile.  Subsequently she was seen and an echocardiogram was ordered.  This showed mild to moderate MR but normal LV function and normal diastolic function with normal pulmonary pressures.  She reports significant shortness of breath which seems somewhat out of proportion to additional  findings.  She has seen pulmonary as well without a clear answer to her shortness of breath which is exertional.  She did bring in blood pressure readings today which across-the-board seem to be still elevated between 135-155 systolic.  PMHx:  Past Medical History:  Diagnosis Date   Abdominal pain    Anxiety    Arthritis    Asthma    Breast cancer (HCC) 06/2013   left upper inner   Cancer (HCC)    breast   Diabetes mellitus without complication (HCC)    Takes Tradjenta   Family history of adverse reaction to anesthesia    Patients mother had to be resusciated after having anesthesia   GERD (gastroesophageal reflux disease)    Headache(784.0)    Takes Depakote   Hernia, incisional periumbilical, incarcerated 08/07/2011   Hot flashes    Hx of radiation therapy 09/09/13- 10/13/13   left breast 5000 cGy in 25 sessions, no boost   Hyperlipidemia    Hypertension    PCP DR Creta Levin   Hypothyroidism     Non-alcoholic fatty liver disease    Peripheral vascular disease (HCC)    pulmonary embolis-still have some   Post concussion syndrome    4 yrs ago- sees [facility]- Dr Pearlean Brownie every 6 months-   has sleep study 2011 following accident but was neg per patient-  short term memory issues, dates and times   Pulmonary embolism (HCC) 2013   Recurrent upper respiratory infection (URI)    FLU 08/23/11-08/29/11 then URI 08/29/11- 09/08/11- S/P zpack and steroids-  improved      no cough or congestion   Thyroid disease    Thyroid mass, left inferior lobe, 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  Dr. Daphine Deutscher   BREAST BIOPSY     BREAST LUMPECTOMY Left 2015   radiation   BREAST LUMPECTOMY WITH NEEDLE LOCALIZATION AND AXILLARY SENTINEL LYMPH NODE BX Left 08/04/2013   Procedure: BREAST LUMPECTOMY WITH NEEDLE LOCALIZATION AND AXILLARY SENTINEL LYMPH NODE Biopsy x 3;  Surgeon: Emelia Loron, MD;  Location: [facility] OR;  Service: General;  Laterality: Left;   BREAST SURGERY     CARDIOVERSION N/A 07/26/2022   Procedure: CARDIOVERSION;  Surgeon: Maisie Fus, MD;  Location: [facility] ENDOSCOPY;  Service: Cardiovascular;  Laterality: N/A;   CARPAL TUNNEL RELEASE  10/2009-12/2010   left - right   CESAREAN SECTION  X 2   COLONOSCOPY     DIAGNOSTIC LAPAROSCOPY     Lap. chole., appendectomy, hernia repair   HERNIA REPAIR  09/15/11   Ventral w/mesh   JOINT REPLACEMENT     KNEE ARTHROPLASTY Right 12/22/2015   Procedure: COMPUTER ASSISTED TOTAL KNEE ARTHROPLASTY;  Surgeon: Eldred Manges, MD;  Location: [facility] OR;  Service: Orthopedics;  Laterality: Right;   LAPAROSCOPIC CHOLECYSTECTOMY  2006   Dr. Randon Goldsmith TUNNEL RELEASE Bilateral    THYROIDECTOMY, PARTIAL     VENTRAL HERNIA REPAIR  09/29/2011   Procedure: LAPAROSCOPIC VENTRAL HERNIA;  Surgeon: Ardeth Sportsman, MD;  Location: [facility] ORS;  Service: General;  Laterality: N/A;  Laparoscopic Ventral Wall Hernia Repair with Mesh    FAMHx:  Family History  Problem Relation Age of Onset   Malignant hyperthermia Mother    Heart attack Father    Stroke Father    Diabetes Other        Grandmother   Other Other        respiratory- Grandmother   Cancer Maternal Grandmother 56       breast   Breast cancer Maternal Grandmother     SOCHx:   reports that she has never smoked. She has never used smokeless tobacco. She reports that she does not drink alcohol and does not use drugs.  ALLERGIES:  Allergies  Allergen Reactions   Aleve [Naproxen Sodium] Hives, Itching and Swelling   Aromasin [Exemestane] Anaphylaxis, Itching and Swelling    Pt currently tolerating medication well (02/03/16)-gwd; Pt stated "I can take this medication for a few months at a time and I take a benadryl when I feel it starting to come on" Patient has facial swelling, itching, and throat swelling  07/12/17  Taking this right now and no problem. sy/rn   Letrozole Shortness Of Breath, Rash and Other (See Comments)    Swelling - feet, eyes,hands;   Speech garbled   Levofloxacin Rash and Swelling    Facial swelling and red facial rash   Penicillins Hives, Swelling and Rash    All over the body. Has patient had a PCN reaction causing immediate rash, facial/tongue/throat swelling, SOB or lightheadedness with hypotension: Yes Has patient had a PCN reaction causing severe rash involving mucus membranes or skin necrosis: No Has patient had a PCN reaction that required hospitalization No Has patient had a PCN reaction occurring within the last 10 years: No If all of the above answers are "NO", then may proceed with Cephalosporin use.    Symbicort [Budesonide-Formoterol  Fumarate] Anaphylaxis, Hives, Swelling and Rash   Codeine Other (See Comments)    hallucinations   Percocet [Oxycodone-Acetaminophen] Other (See Comments)    Causes syncope day after medication has been taken.   Vicodin [Hydrocodone-Acetaminophen] Other (See Comments)    Causes syncope day after medication has been taken.   Doxycycline Swelling    Must with  antihistamine   Ezetimibe Other (See Comments)    Myalgias,fatigue   Prednisone Other (See Comments)    Can take with antihistamine/ can take the shot without antihistamine   Cefdinir Rash and Swelling   Losartan Rash    ROS: Pertinent items noted in HPI and remainder of comprehensive ROS otherwise negative.  HOME MEDS: Current Outpatient Medications on File Prior to Visit  Medication Sig Dispense Refill   albuterol (VENTOLIN HFA) 108 (90 Base) MCG/ACT inhaler Inhale 2 puffs into the lungs every 6 (six) hours as needed for wheezing or shortness of breath.      amLODipine (NORVASC) 5 MG tablet Take 1 tablet (5 mg total) by mouth in the morning AND 1 tablet (5 mg total) every evening. 180 tablet 3   amphetamine-dextroamphetamine (ADDERALL) 20 MG tablet Take 20 mg by mouth 2 (two) times daily. Take 20mg  in the morning and 20mg  in the afternoon.     cetirizine (ZYRTEC) 10 MG tablet Take 10 mg by mouth daily.     cholecalciferol (VITAMIN D3) 25 MCG (1000 UNIT) tablet Take 1,000 Units by mouth daily.     Doxepin HCl 6 MG TABS Take 1 tablet by mouth at bedtime.     ELIQUIS 5 MG TABS tablet TAKE 1 TABLET(5 MG) BY MOUTH TWICE DAILY 60 tablet 11   Evolocumab (REPATHA SURECLICK) 140 MG/ML  SOAJ INJECT 1 PEN INTO THE SKIN EVERY 14 DAYS 2 mL 11   fluticasone-salmeterol (ADVAIR) 250-50 MCG/ACT AEPB Inhale 1 puff into the lungs every 12 (twelve) hours. 60 each 2   Levomilnacipran HCl ER (FETZIMA) 80 MG CP24 Take 80 mg by mouth daily.     levothyroxine (SYNTHROID) 112 MCG tablet Take 100 mcg by mouth daily before breakfast.     metoprolol  succinate (TOPROL-XL) 25 MG 24 hr tablet TAKE 1/2 TABLET(12.5 MG) BY MOUTH DAILY. MAY TAKE EXTRA 1/2 TABLET FOR SUSTAINED HEART RATE>100 FOR 45 MINUTES 45 tablet 3   nystatin-triamcinolone (MYCOLOG II) cream Apply 1 Application topically 4 (four) times daily.     omeprazole (PRILOSEC) 40 MG capsule TAKE 1 CAPSULE BY MOUTH DAILY (Patient taking differently: Take 40 mg by mouth in the morning and at bedtime.) 30 capsule 0   oxybutynin (DITROPAN-XL) 10 MG 24 hr tablet Take 20 mg by mouth at bedtime.     potassium chloride (KLOR-CON) 10 MEQ tablet TAKE 1 TABLET(10 MEQ) BY MOUTH DAILY 90 tablet 3   Semaglutide, 2 MG/DOSE, (OZEMPIC, 2 MG/DOSE,) 8 MG/3ML SOPN Inject 2 mg into the skin every Sunday.     vitamin E 180 MG (400 UNITS) capsule Take 400 Units by mouth daily.     zonisamide (ZONEGRAN) 100 MG capsule Take 1 capsule (100 mg total) by mouth 2 (two) times daily. 60 capsule 5   No current facility-administered medications on file prior to visit.    LABS/IMAGING: No results found for this or any previous visit (from the past 48 hour(s)). No results found.  LIPID PANEL:    Component Value Date/Time   CHOL 114 12/22/2021 1015   TRIG 104 12/22/2021 1015   HDL 54 12/22/2021 1015   CHOLHDL 2.1 12/22/2021 1015   LDLCALC 41 12/22/2021 1015    WEIGHTS: Wt Readings from Last 3 Encounters:  04/27/23 211 lb (95.7 kg)  03/19/23 206 lb (93.4 kg)  01/30/23 207 lb 12.8 oz (94.3 kg)    VITALS: BP (!) 144/94   Pulse 71   Ht 5\' 2"  (1.575 m)   Wt 211 lb (95.7 kg)   SpO2 97%   BMI 38.59 kg/m   EXAM: General appearance: alert, no distress, and moderately obese Lungs: clear to auscultation bilaterally Heart: irregularly irregular rhythm Extremities: extremities normal, atraumatic, no cyanosis or edema Neurologic: Grossly normal Psych: Pleasant  EKG: EKG Interpretation Date/Time:  Friday April 27 2023 13:46:49 EDT Ventricular Rate:  71 PR Interval:  136 QRS Duration:  76 QT  Interval:  384 QTC Calculation: 417 R Axis:   43  Text Interpretation: Normal sinus rhythm Normal ECG When compared with ECG of 26-Jul-2022 12:29, No significant change was found Confirmed by Zoila Shutter [phone]) on 04/27/2023 1:54:25 PM    ASSESSMENT: DOE, Fatigue CAC score of 56, 64th percentile, minimal to mild nonobstructive coronary disease (10/2022) Mild to moderate mitral regurgitation, normal LVEF 60 to 65% by echo (12/2022) History of atrial flutter s/p DCCV (2023) Paroxysmal atrial fibrillation-CHA2DS2-VASc score of 3 Probable HeFH-Dutch Score of 6 Family history of premature coronary disease Statin intolerance Multivessel coronary calcification Uncontrolled hypertension  PLAN: 1.   Mrs. Morre still reports significant shortness of breath and fatigue which seems out of proportion to her findings.  No significant coronary disease.  She does have some mild to moderate MR.  Blood pressure is still not well-controlled.  I would advise adding spironolactone.  She is on potassium supplement which I will stop.  Plan metabolic profile in about 2 weeks.  Continue to monitor blood pressures.  Follow-up with me or APP in about 3 months.  She did have a rash with losartan and therefore may be considered an allergy and other ARB's, ACE inhibitor's or Ulysees Barns should be avoided.  Chrystie Nose, MD, [facility], FACP  [place]  [facility]  Medical Director of the Advanced Lipid Disorders &  Cardiovascular Risk Reduction Clinic Diplomate of the American Board of Clinical Lipidology Attending Cardiologist  Direct Dial: [phone]  Fax: [phone]  Website:  [email or web address] Nicely Doyal Saric 04/27/2023, 1:54 PM

## 2023-04-27 NOTE — Patient Instructions (Addendum)
Medication Instructions:  START spironolactone 25mg  daily  STOP potassium  *If you need a refill on your cardiac medications before your next appointment, please call your pharmacy*  Lab Work: Non-Fasting BMET in 2 weeks  If you have labs (blood work) drawn today and your tests are completely normal, you will receive your results only by: MyChart Message (if you have MyChart) OR A paper copy in the mail If you have any lab test that is abnormal or we need to change your treatment, we will call you to review the results.   Follow-Up: At [facility], you and your health needs are our priority.  As part of our continuing mission to provide you with exceptional heart care, we have created designated Provider Care Teams.  These Care Teams include your primary Cardiologist (physician) and Advanced Practice Providers (APPs -  Physician Assistants and Nurse Practitioners) who all work together to provide you with the care you need, when you need it.  We recommend signing up for the patient portal called "MyChart".  Sign up information is provided on this After Visit Summary.  MyChart is used to connect with patients for Virtual Visits (Telemedicine).  Patients are able to view lab/test results, encounter notes, upcoming appointments, etc.  Non-urgent messages can be sent to your provider as well.   To learn more about what you can do with MyChart, go to [email or web address].    Your next appointment:    3 months with Edd Fabian NP

## 2023-05-05 ENCOUNTER — Other Ambulatory Visit: Payer: Self-pay | Admitting: Primary Care

## 2023-05-07 ENCOUNTER — Other Ambulatory Visit: Payer: Medicare PPO

## 2023-05-08 ENCOUNTER — Other Ambulatory Visit: Payer: Medicare PPO

## 2023-05-08 ENCOUNTER — Inpatient Hospital Stay
Admission: RE | Admit: 2023-05-08 | Discharge: 2023-05-08 | Discharge status: Home / Self Care | Payer: Medicare PPO | Source: Ambulatory Visit | Attending: Adult Health

## 2023-05-08 DIAGNOSIS — R519 Headache, unspecified: Secondary | ICD-10-CM

## 2023-05-08 MED ORDER — GADOPICLENOL 0.5 MMOL/ML IV SOLN
10.0000 mL | Freq: Once | INTRAVENOUS | Status: AC | PRN
  Administered 2023-05-08: 10 mL via INTRAVENOUS

## 2023-05-15 ENCOUNTER — Ambulatory Visit: Payer: Medicare PPO | Admitting: Adult Health

## 2023-05-23 ENCOUNTER — Encounter (HOSPITAL_BASED_OUTPATIENT_CLINIC_OR_DEPARTMENT_OTHER): Payer: Self-pay | Admitting: Pulmonary Disease

## 2023-05-23 ENCOUNTER — Ambulatory Visit (HOSPITAL_BASED_OUTPATIENT_CLINIC_OR_DEPARTMENT_OTHER): Payer: Medicare PPO | Admitting: Pulmonary Disease

## 2023-05-23 VITALS — BP 124/82 | HR 91 | Resp 18

## 2023-05-23 DIAGNOSIS — R0609 Other forms of dyspnea: Secondary | ICD-10-CM | POA: Diagnosis not present

## 2023-05-23 DIAGNOSIS — Z23 Encounter for immunization: Secondary | ICD-10-CM | POA: Diagnosis not present

## 2023-05-23 NOTE — Progress Notes (Signed)
Subjective:    Patient ID: Alyssa [name], female    DOB: 09-13-1950, 72 y.o.   MRN: [id number]  HPI 72 year old never smoker presents for evaluation of shortness of breath  She was seen in our office in the past for upper airway cough syndrome attributed to GERD  Last OV 04/2022 >> treated with Medrol for reactive airways  Allergy -to prednisone PMH: Breast cancer 2014, DM, GERD, HA, HLD, HTN, Hypothyroidism, PAD -Submassive pulmonary embolism 2013, 10 days after hernia surgery, strong family history her dad died at age 67 of PE, took anticoagulation for 1 year -Postconcussion syndrome 2015 -Atrial fibrillation status post ablation 07/2022   Chief Complaint  Patient presents with   Follow-up    Still has terrible SOB, cardiologist doesn't see cause. Its limiting her in her life.    She complains of shortness of breath now ongoing for about a year.  She is under daughter cardiology evaluation CT coronaries/2024 showed mild nonobstructive CAD with a calcium score of 56. Echo 12/2022 showed moderate MR, normal LV function and no evidence of pulmonary hypertension  She was evaluated by APP 01/2023 and given Advair instead of Flovent.  She has been able to get by GI and underwent CT abdomen/pelvis and esophagram culture nondiagnostic  She reports redness of breath on walking from the parking lot on level ground.  She went to [facility] and had shortness of breath and walking on level ground. She carries a diagnosis of asthma since 2015 On ambulation oxygen saturation dropped lowest to 92% and recovered with resting  Significant tests/ events reviewed  Spirometry 04/2022 showed no airway obstruction, FEV1 81%, FVC 87%   CTA chest 04/20/22 neg CT angio chest 10/07/11 >> large PE  FeNO 04/30/17 >> 14  Review of Systems neg for any significant sore throat, dysphagia, itching, sneezing, nasal congestion or excess/ purulent secretions, fever, chills, sweats, unintended wt loss,  pleuritic or exertional cp, hempoptysis, orthopnea pnd or change in chronic leg swelling. Also denies presyncope, palpitations, heartburn, abdominal pain, nausea, vomiting, diarrhea or change in bowel or urinary habits, dysuria,hematuria, rash, arthralgias, visual complaints, headache, numbness weakness or ataxia.     Objective:   Physical Exam  Gen. Pleasant, obese, in no distress ENT - no lesions, no post nasal drip Neck: No JVD, no thyromegaly, no carotid bruits Lungs: no use of accessory muscles, no dullness to percussion, decreased without rales or rhonchi  Cardiovascular: Rhythm regular, heart sounds  normal, no murmurs or gallops, no peripheral edema Musculoskeletal: No deformities, no cyanosis or clubbing , no tremors       Assessment & Plan:

## 2023-05-23 NOTE — Patient Instructions (Addendum)
Continue on Advair  x flu shot  x ambulatory saturation

## 2023-05-23 NOTE — Assessment & Plan Note (Signed)
No cause is apparent on evaluation today.  She does not seem to have significant airway obstruction or asthma.  Cardiac etiologies have been ruled out.  There is no evidence of pulmonary hypertension on echo or pulmonary fibrosis on imaging.  She does have a history of pulm embolism in 2013 but has been maintained on anticoagulation in the past year and CT angiogram was negative in 2023. She is maintained on Advair now and does not feel that this is typical for asthma.  We will take a wait and watch approach.  I advised her to start on a graduated exercise program for deconditioning and get back with her trainer.  If she has progressive symptoms we may have to evaluate with a cardiopulmonary stress test

## 2023-05-25 LAB — BASIC METABOLIC PANEL
BUN/Creatinine Ratio: 21 (ref 12–28)
BUN: 19 mg/dL (ref 8–27)
CO2: 24 mmol/L (ref 20–29)
Calcium: 10.1 mg/dL (ref 8.7–10.3)
Chloride: 102 mmol/L (ref 96–106)
Creatinine, Ser: 0.89 mg/dL (ref 0.57–1.00)
Glucose: 103 mg/dL — ABNORMAL HIGH (ref 70–99)
Potassium: 4.6 mmol/L (ref 3.5–5.2)
Sodium: 140 mmol/L (ref 134–144)
eGFR: 69 mL/min/{1.73_m2} (ref >59)

## 2023-06-20 ENCOUNTER — Other Ambulatory Visit: Payer: Self-pay | Admitting: Pulmonary Disease

## 2023-07-04 ENCOUNTER — Other Ambulatory Visit: Payer: Self-pay | Admitting: Internal Medicine

## 2023-07-23 ENCOUNTER — Encounter: Payer: Self-pay | Admitting: Nurse Practitioner

## 2023-07-30 ENCOUNTER — Ambulatory Visit: Payer: Medicare PPO | Admitting: General Practice

## 2023-08-14 NOTE — Progress Notes (Signed)
Cardiology Clinic Note   Patient Name: Alyssa [name] Date of Encounter: 08/17/2023  Primary Care Provider:  Gwenlyn Found, MD Primary Cardiologist:  Alyssa Nose, MD  Patient Profile    Alyssa [name] 73 year old female presents the clinic today for follow-up evaluation of her shortness of breath.  Past Medical History    Past Medical History:  Diagnosis Date   Abdominal pain    Anxiety    Arthritis    Asthma    Breast cancer (HCC) 06/2013   left upper inner   Cancer (HCC)    breast   Diabetes mellitus without complication (HCC)    Takes Tradjenta   Family history of adverse reaction to anesthesia    Patients mother had to be resusciated after having anesthesia   GERD (gastroesophageal reflux disease)    Headache(784.0)    Takes Depakote   Hernia, incisional periumbilical, incarcerated 08/07/2011   Hot flashes    Hx of radiation therapy 09/09/13- 10/13/13   left breast 5000 cGy in 25 sessions, no boost   Hyperlipidemia    Hypertension    PCP DR Alyssa [name]   Hypothyroidism    Non-alcoholic fatty liver disease    Peripheral vascular disease (HCC)    pulmonary embolis-still have some   Post concussion syndrome    4 yrs ago- sees [facility]- Dr Alyssa [name] every 6 months-   has sleep study 2011 following accident but was neg per patient-  short term memory issues, dates and times   Pulmonary embolism (HCC) 2013   Recurrent upper respiratory infection (URI)    FLU 08/23/11-08/29/11 then URI 08/29/11- 09/08/11- S/P zpack and steroids-  improved      no cough or congestion   Thyroid disease    Thyroid mass, left inferior lobe, 3.6cm [id number] 10/07/2011   Ventral hernia    Past Surgical History:  Procedure Laterality Date   ANTERIOR CERVICAL DECOMP/DISCECTOMY FUSION N/A 01/16/2020   Procedure: c5-6, c6-7 ANTERIOR CERVICAL DECOMPRESSION/DISCECTOMY FUSION, ALLOGRAFT AND PLATE;  Surgeon: Eldred Manges, MD;  Location: [facility] OR;  Service: Orthopedics;  Laterality: N/A;    APPENDECTOMY  2002   lap appy.  Dr. Daphine Deutscher   BREAST BIOPSY     BREAST LUMPECTOMY Left 2015   radiation   BREAST LUMPECTOMY WITH NEEDLE LOCALIZATION AND AXILLARY SENTINEL LYMPH NODE BX Left 08/04/2013   Procedure: BREAST LUMPECTOMY WITH NEEDLE LOCALIZATION AND AXILLARY SENTINEL LYMPH NODE Biopsy x 3;  Surgeon: Emelia Loron, MD;  Location: [facility] OR;  Service: General;  Laterality: Left;   BREAST SURGERY     CARDIOVERSION N/A 07/26/2022   Procedure: CARDIOVERSION;  Surgeon: Maisie Fus, MD;  [facility]: [facility] ENDOSCOPY;  Service: Cardiovascular;  Laterality: N/A;   CARPAL TUNNEL RELEASE  10/2009-12/2010   left - right   CESAREAN SECTION  X 2   COLONOSCOPY     DIAGNOSTIC LAPAROSCOPY     Lap. chole., appendectomy, hernia repair   HERNIA REPAIR  09/15/11   Ventral w/mesh   JOINT REPLACEMENT     KNEE ARTHROPLASTY Right 12/22/2015   Procedure: COMPUTER ASSISTED TOTAL KNEE ARTHROPLASTY;  Surgeon: Eldred Manges, MD;  Location: [facility] OR;  Service: Orthopedics;  Laterality: Right;   LAPAROSCOPIC CHOLECYSTECTOMY  2006   Dr. Randon Goldsmith TUNNEL RELEASE Bilateral    THYROIDECTOMY, PARTIAL     VENTRAL HERNIA REPAIR  09/29/2011   Procedure: LAPAROSCOPIC VENTRAL HERNIA;  Surgeon: Ardeth Sportsman, MD;  Location: [facility] ORS;  Service: General;  Laterality: N/A;  Laparoscopic Ventral Wall Hernia Repair with Mesh    Allergies  Allergies  Allergen Reactions   Aleve [Naproxen Sodium] Hives, Itching and Swelling   Aromasin [Exemestane] Anaphylaxis, Itching and Swelling    Pt currently tolerating medication well (02/03/16)-gwd; Pt stated "I can take this medication for a few months at a time and I take a benadryl when I feel it starting to come on" Patient has facial swelling, itching, and throat swelling  07/12/17  Taking this right now and no problem. sy/rn   Letrozole Shortness Of Breath, Rash and Other (See Comments)    Swelling - feet, eyes,hands;   Speech garbled   Levofloxacin Rash and Swelling    Facial  swelling and red facial rash   Penicillins Hives, Swelling and Rash    All over the body. Has patient had a PCN reaction causing immediate rash, facial/tongue/throat swelling, SOB or lightheadedness with hypotension: Yes Has patient had a PCN reaction causing severe rash involving mucus membranes or skin necrosis: No Has patient had a PCN reaction that required hospitalization No Has patient had a PCN reaction occurring within the last 10 years: No If all of the above answers are "NO", then may proceed with Cephalosporin use.    Symbicort [Budesonide-Formoterol Fumarate] Anaphylaxis, Hives, Swelling and Rash   Codeine Other (See Comments)    hallucinations   Percocet [Oxycodone-Acetaminophen] Other (See Comments)    Causes syncope day after medication has been taken.   Vicodin [Hydrocodone-Acetaminophen] Other (See Comments)    Causes syncope day after medication has been taken.   Doxycycline Swelling    Must with  antihistamine   Ezetimibe Other (See Comments)    Myalgias,fatigue   Prednisone Other (See Comments)    Can take with antihistamine/ can take the shot without antihistamine   Cefdinir Rash and Swelling   Losartan Rash    History of Present Illness    Alyssa [name] has a PMH of pulmonary embolism in 2010, breast cancer, atrial fibrillation, hypertension, and GERD.  She was seen by Dr. Rennis Golden.  She was referred to the lipid clinic by her PCP.  She reported that her cholesterol has been elevated since she was in her 64s.  Both of her brothers had elevated cholesterol over 400.  Her mother also had elevated cholesterol and her father died from heart disease at age 64.  She reported statin intolerance and had been on several cholesterol-lowering medications including Crestor, Lipitor, lovastatin, and ezetimibe.  Her cholesterol was noted to be 343, triglycerides 154, HDL 40, and LDL 272.  She has lost around 40 pounds while following a ketogenic diet.  She reported that she has  been taking Crestor but noted significant pain with that and finally discontinued the medication.  In 2013 she had a pulmonary embolus.  Her CT was reviewed and did show multivessel coronary disease.     She was seen in follow-up on 01/04/2022 by Dr. Rennis Golden.  She was responding well to Repatha.  Her LDL cholesterol was 41.  She reported that her blood pressure continued to be uncontrolled.  She had been started on amlodipine 5 mg daily.  Her blood pressures at home ranged in the 140-1 160/80 range.  She reported rash with ARB type medications.  Her heart rate was noted to be 57.  She indicated that she was noticing intermittent periods of palpitations with no clear cause.  She was wearing an Apple Watch and had done EKGs.  She reported that  interpretation noted suggestions for atrial fibrillation.  They were reviewed and indicated that she was having multiple alerts indicating atrial fibrillation.  Her CHA2DS2-VASc score was 3 for age, gender, and hypertension.  She was started on metoprolol 12.5 mg daily, amlodipine, she denied OSA.  She was started on apixaban 5 mg daily and follow-up was planned for 6 months.   She presented to clinic 02/24/22 for follow-up evaluation stated she had noticed frequent episodes of accelerated heart rate.  She hds been tracking the heart rates with her Apple Watch and iPhone.  She also had noticed that her average blood pressure had been 139-142 range systolic.  Initially in the office  her blood pressure was 142/90.  On recheck her blood pressure was 138/88.  We reviewed her medication regimen and she expressed understanding.  We reviewed her CVA risk.  I asked her take an extra dose of metoprolol for sustained elevated heart rate that lasts more than 45 minutes.  I  also increased her amlodipine to 5 mg in the morning and at 2.5 mg in the evening.  She reported compliance with apixaban and denies bleeding issues.  I planned follow-up as scheduled with Dr. Rennis Golden.  I  asked her to  continue her physical activity and avoid triggers for atrial fibrillation.  She expressed understanding.   She presented to the emergency department on 04/20/2022 with shortness of breath and a cough.  She reported that she had noted her symptoms for approximately 3 weeks.  She denied fever.  She did note some congestion.  She had been seen a couple of days prior by her PCP who had some concern for pneumonia.  Her chest x-ray was negative.  She reported that her coughing has been persistent and prompted her to seek further evaluation.  She indicated that she had been recently diagnosed with atrial fibrillation and was compliant with her apixaban and metoprolol.  Her blood pressure was 134/108 and her pulse was 76.  Her EKG showed sinus bradycardia with a rate of 59 bpm.  She underwent chest CT to rule out PE.  Her CT showed no pulmonary embolus and clear lung fields.  Her D-dimer was negative.    She presented to the clinic 04/25/22 for follow-up evaluation stated she  had a cough for about 4 weeks.  We reviewed her  ED visit and CT scan.  She expressed understanding.  We also reviewed  EKG.  She reported compliance with her anticoagulation.  She had taken about 4 extra doses of her metoprolol.  She reported just mild improvement with nebulizer treatments while she was in the emergency department and mild improvement in her symptoms with using her inhaler.  I ordered an echocardiogram, continued her current medication regimen, and planned follow-up for 1 to 2 months.  She followed up regularly with cardiology and was last seen by Dr. Rennis Golden on 04/27/2023.  She reported ongoing shortness of breath.  She had undergone CT coronary angiography which showed a coronary calcium score of 56 placing her in the 64th percentile.  She also had an echocardiogram which showed mild-moderate MR but normal LV function and normal diastolic function.  She noted significant shortness of breath which seemed out of proportion for her  findings.  She was seen by pulmonology who also could not provide a clear answer to her shortness of breath with exertion.  She presented with her blood pressure readings which showed systolic blood pressures in the 135-155 range.  Her EKG 04/27/2023 showed  normal sinus rhythm 71 bpm.  It was explained that there was no significant coronary disease noted on her studies.  Spironolactone was added to her medication regimen.  Her supplemental potassium was discontinued.  Follow-up was planned for 3 months.  She presents to the clinic today for follow-up evaluation and states she continues to have shortness of breath.  She reports that she was becoming depressed because she was limited in her physical activity.  Her cardiac event monitor showed very infrequent atrial fibrillation.  She occasionally uses metoprolol to stop periods of accelerated/irregular heartbeat.  She reports compliance with her apixaban.  She denies bleeding issues.  She reports that she has gone back to work part-time for [employer].  She is enjoying this.  She has a standing desk and she feels that has helped her mental health.  She does occasionally note some dizziness.  That lasted for minutes and dissipates without intervention.  She reports good hydration.  I have asked her to increase her low intensity aerobic activity and continue to work on high-fiber diet.  Will plan follow-up in 6 months.  I will give her the Epley maneuvers information.  Today she denies chest pain, increased shortness of breath, lower extremity edema, fatigue, palpitations, melena, hematuria, hemoptysis, diaphoresis, weakness, presyncope, syncope, orthopnea, and PND.    Home Medications    Prior to Admission medications   Medication Sig Start Date End Date Taking? Authorizing Provider  albuterol (VENTOLIN HFA) 108 (90 Base) MCG/ACT inhaler Inhale 2 puffs into the lungs every 6 (six) hours as needed for wheezing or shortness of breath.  12/17/19    [provider]  amLODipine (NORVASC) 5 MG tablet Take 1 tablet (5 mg total) by mouth in the morning AND 0.5 tablets (2.5 mg total) every evening. 02/24/22   Ronney Asters, NP  amphetamine-dextroamphetamine (ADDERALL) 20 MG tablet Take 20 mg by mouth See admin instructions. Take 20mg  in the morning and 20mg  in the afternoon.    [provider]  apixaban (ELIQUIS) 5 MG TABS tablet Take 1 tablet (5 mg total) by mouth 2 (two) times daily. 01/04/22   Hilty, Lisette Abu, MD  cetirizine (ZYRTEC) 10 MG tablet Take by mouth.    [provider]  levothyroxine (SYNTHROID) 125 MCG tablet Take 125 mcg by mouth daily before breakfast. 01/16/22   [provider]  levothyroxine (SYNTHROID) 137 MCG tablet 1 tablet in the morning on an empty stomach 11/09/21   [provider]  metoprolol succinate (TOPROL-XL) 25 MG 24 hr tablet Take 0.5 tablets (12.5 mg total) by mouth daily. MAY TAKE EXTRA 1/2 TAB FOR SUSTAINED HR >100 FOR 45 MIN 02/24/22   Ronney Asters, NP  omeprazole (PRILOSEC) 40 MG capsule TAKE 1 CAPSULE BY MOUTH DAILY 03/20/19   Kalman Shan, MD  oxybutynin (DITROPAN-XL) 10 MG 24 hr tablet Take 10 mg by mouth daily.  05/14/18   [provider]  potassium chloride (KLOR-CON) 10 MEQ tablet TAKE 1 TABLET(10 MEQ) BY MOUTH DAILY 06/28/21   Hilty, Lisette Abu, MD  REPATHA SURECLICK 140 MG/ML SOAJ INJECT 1 DOSE INTO THE SKIN EVERY 14 DAYS 08/22/21   Hilty, Lisette Abu, MD  Semaglutide (OZEMPIC, 0.25 OR 0.5 MG/DOSE, [place]) Inject 1 mg into the skin once a week.    [provider]  traZODone (DESYREL) 50 MG tablet Take 50 mg by mouth at bedtime. 02/12/22   [provider]    Family History    Family History  Problem  Relation Age of Onset   Malignant hyperthermia Mother    Heart attack Father    Stroke Father    Diabetes Other        Grandmother   Other Other        respiratory- Grandmother   Cancer Maternal Grandmother 57       breast   Breast  cancer Maternal Grandmother    She indicated that her mother is alive. She indicated that her father is deceased. She indicated that her brother is alive. She indicated that the status of her maternal grandmother is unknown. She indicated that the status of her other is unknown.  Social History    Social History   Socioeconomic History   Marital status: Divorced    Spouse name: Not on file   Number of children: 2   Years of education: Master's   Highest education level: Not on file  Occupational History    Employer: [employer]  Tobacco Use   Smoking status: Never   Smokeless tobacco: Never  Vaping Use   Vaping status: Never Used  Substance and Sexual Activity   Alcohol use: No    Alcohol/week: 1.0 standard drink of alcohol    Types: 1 Glasses of wine per week   Drug use: No   Sexual activity: Yes    Comment: Menarche age 35, first live birth age 81,  GX P2. She stopped having periods approximately 1999 and took HRT until 2005.  Other Topics Concern   Not on file  Social History Narrative   Patient is divorced and lives alone.   Patient drinks 3-4 cups of caffeine daily.    Patient is right handed.   Social Drivers of Health   Financial Resource Strain: Low Risk  (04/05/2022)   Received from [facility], [facility]   Overall Financial Resource Strain (CARDIA)    Difficulty of Paying Living Expenses: Not hard at all  Food Insecurity: Low Risk  (03/09/2023)   Received from [facility]   Hunger Vital Sign    Worried About Running Out of Food in the Last Year: Never true    Ran Out of Food in the Last Year: Never true  Transportation Needs: Not on file (03/09/2023)  Physical Activity: Sufficiently Active (02/25/2022)   Received from [facility], [facility] visits prior to 09/23/2022., [facility] visits prior to 09/23/2022., [facility]   Exercise Vital Sign    Days of Exercise per Week: 4 days    Minutes of  Exercise per Session: 40 min  Stress: Stress Concern Present (02/25/2022)   [facility] from [facility] visits prior to 09/23/2022., [facility], [facility] visits prior to 09/23/2022., [facility]   [employer] of Occupational Health - Occupational Stress Questionnaire    Feeling of Stress : Rather much  Social Connections: Socially Integrated (02/25/2022)   [facility] from [facility] visits prior to 09/23/2022., [facility], [name] visits prior to 09/23/2022., [facility]   Social Connection and Isolation Panel [NHANES]    Frequency of Communication with Friends and Family: More than three times a week    Frequency of Social Gatherings with Friends and Family: More than three times a week    Attends Religious Services: More than 4 times per year    Active Member of [employer] or Organizations: Yes    Attends Banker Meetings: More than  4 times per year    Marital Status: Living with partner  Intimate Partner Violence: Not At Risk (02/25/2022)   Received from [facility] visits prior to 09/23/2022., [facility] visits prior to 09/23/2022.   Humiliation, Afraid, Rape, and Kick questionnaire    Fear of Current or Ex-Partner: No    Emotionally Abused: No    Physically Abused: No    Sexually Abused: No     Review of Systems    General:  No chills, fever, night sweats or weight changes.  Cardiovascular:  No chest pain, dyspnea on exertion, edema, orthopnea, palpitations, paroxysmal nocturnal dyspnea. Dermatological: No rash, lesions/masses Respiratory: No cough, dyspnea Urologic: No hematuria, dysuria Abdominal:   No nausea, vomiting, diarrhea, bright red blood per rectum, melena, or hematemesis Neurologic:  No visual changes, wkns, changes in mental status. All other systems reviewed and are otherwise negative except as noted above.  Physical Exam     VS:  BP 136/70 (BP Location: Left Arm, Patient Position: Sitting, Cuff Size: Large)   Pulse 77   Ht 5\' 2"  (1.575 m)   Wt 213 lb 12.8 oz (97 kg)   SpO2 97%   BMI 39.10 kg/m  , BMI Body mass index is 39.1 kg/m. GEN: Well nourished, well developed, in no acute distress. HEENT: normal. Neck: Supple, no JVD, carotid bruits, or masses. Cardiac: RRR, no murmurs, rubs, or gallops. No clubbing, cyanosis, edema.  Radials/DP/PT 2+ and equal bilaterally.  Respiratory:  Respirations regular and unlabored, clear to auscultation bilaterally. GI: Soft, nontender, nondistended, BS + x 4. MS: no deformity or atrophy. Skin: warm and dry, no rash. Neuro:  Strength and sensation are intact. Psych: Normal affect.  Accessory Clinical Findings    Recent Labs: 05/24/2023: BUN 19; Creatinine, Ser 0.89; Potassium 4.6; Sodium 140   Recent Lipid Panel    Component Value Date/Time   CHOL 114 12/22/2021 1015   TRIG 104 12/22/2021 1015   HDL 54 12/22/2021 1015   CHOLHDL 2.1 12/22/2021 1015   LDLCALC 41 12/22/2021 1015         ECG personally reviewed by me today-none today.  EKG 01/04/2022 Sinus bradycardia 57 bpm  EKG 04/21/2022 Sinus bradycardia 59 bpm  Echocardiogram 01/19/2023 IMPRESSIONS     1. Left ventricular ejection fraction, by estimation, is 60 to 65%. The  left ventricle has normal function. The left ventricle has no regional  wall motion abnormalities. Left ventricular diastolic parameters were  normal.   2. Right ventricular systolic function is normal. The right ventricular  size is normal. There is normal pulmonary artery systolic pressure. The  estimated right ventricular systolic pressure is 28.0 mmHg.   3. Left atrial size was mildly dilated.   4. The mitral valve is grossly normal. Mild to moderate mitral valve  regurgitation.   5. The aortic valve is tricuspid. There is moderate calcification of the  aortic valve. There is moderate thickening of the aortic valve.  Aortic  valve regurgitation is not visualized. Aortic valve mean gradient measures  12.0 mmHg. Aortic valve Vmax  measures 2.24 m/s.   6. The inferior vena cava is normal in size with greater than 50%  respiratory variability, suggesting right atrial pressure of 3 mmHg.   Comparison(s): Prior TTE 04/2022 with mAoV gradient , Vmax 2.11m/s.  Overall, no significant change.   FINDINGS   Left Ventricle: Left ventricular ejection fraction, by estimation, is 60  to 65%. The left ventricle has normal function.  The left ventricle has no  regional wall motion abnormalities. Global longitudinal strain performed  but not reported based on  interpreter judgement due to suboptimal tracking. The left ventricular  internal cavity size was normal in size. There is no left ventricular  hypertrophy. Left ventricular diastolic parameters were normal.   Right Ventricle: The right ventricular size is normal. No increase in  right ventricular wall thickness. Right ventricular systolic function is  normal. There is normal pulmonary artery systolic pressure. The tricuspid  regurgitant velocity is 2.50 m/s, and   with an assumed right atrial pressure of 3 mmHg, the estimated right  ventricular systolic pressure is 28.0 mmHg.   Left Atrium: Left atrial size was mildly dilated.   Right Atrium: Right atrial size was normal in size.   Pericardium: There is no evidence of pericardial effusion.   Mitral Valve: The mitral valve is grossly normal. Mild to moderate mitral  valve regurgitation.   Tricuspid Valve: The tricuspid valve is normal in structure. Tricuspid  valve regurgitation is trivial.   Aortic Valve: The aortic valve is tricuspid. There is moderate  calcification of the aortic valve. There is moderate thickening of the  aortic valve. Aortic valve regurgitation is not visualized. Aortic valve  mean gradient measures 12.0 mmHg. Aortic valve  peak gradient measures 20.1 mmHg. Aortic valve area,  by VTI measures 1.72  cm.   Pulmonic Valve: The pulmonic valve was normal in structure. Pulmonic valve  regurgitation is trivial.   Aorta: The aortic root and ascending aorta are structurally normal, with  no evidence of dilitation.   Venous: The inferior vena cava is normal in size with greater than 50%  respiratory variability, suggesting right atrial pressure of 3 mmHg.   IAS/Shunts: The atrial septum is grossly normal.     Coronary CTA 11/10/2022  FINDINGS: A 100 kV prospective scan was triggered in the descending thoracic aorta at 111 HU's. Axial non-contrast 3 mm slices were carried out through the heart. The data set was analyzed on a dedicated work station and scored using the Agatson method. Gantry rotation speed was 250 msecs and collimation was .6 mm. No beta blockade and 0.8 mg of sl NTG was given. The 3D data set was reconstructed in 5% intervals of the 35-75% of the R-R cycle. Phases were analyzed on a dedicated work station using MPR, MIP and VRT modes. The patient received 100 cc of contrast.   Coronary Arteries:  Normal coronary origin.  Right dominance.   RCA is a large dominant artery that gives rise to PDA and PLA. Noncalcified plaque in proximal RCA causes 0-24% stenosis. Calcified plaque in mid RCA causes 25-49% stenosis   Left main is a large artery that gives rise to LAD and LCX arteries. Calcified plaque in left main causes 0-24% stenosis   LAD is a large vessel. Calcified plaque in mid LAD causes 0-24% stenosis   LCX is a non-dominant artery that gives rise to one large OM1 branch. Mixed plaque in proximal LCX causes 0-24% stenosis. Calcified plaque in mid LCX causes 0-24% stenosis. Calcified plaque in OM1 causes 24-49% stenosis.   Other findings:   Left Ventricle: Normal size   Left Atrium: Mild enlargement   Pulmonary Veins: Normal configuration   Right Ventricle: Normal size   Right Atrium: Normal size   Cardiac valves: Mild AV  calcifications   Thoracic aorta: Normal size   Pulmonary Arteries: Normal size   Systemic Veins: Normal drainage   Pericardium: Normal thickness  IMPRESSION: 1. Coronary calcium score of 56. This was 64th percentile for age and sex matched control.   2. Total plaque volume 122mm3 which is 49th percentile for age and sex-matched controls (calcified plaque 45mm3; noncalcified plaque 138mm3). TPV is moderate   3.  Normal coronary origin with right dominance.   4.  Nonobstructive CAD   5.  Mild (25-49%) stenosis in mid RCA and OM1   6. Minimal (0-24%) stenosis in left main, mid LAD, proximal LCX, mid LCX, and proximal RCA   CAD-RADS 2. Mild non-obstructive CAD (25-49%). Consider non-atherosclerotic causes of chest pain. Consider preventive therapy and risk factor modification.   Electronically Signed: By: Epifanio Lesches M.D. On: 11/10/2022 22:38   Assessment & Plan   1. Essential hypertension-BP today 136/70.  Continue amlodipine, metoprolol Heart healthy low-sodium diet Increase physical activity as tolerated  Shortness of breath/DOE-continues with chronic shortness of breath.  Underwent coronary CTA which showed nonobstructive coronary disease/24.  Echocardiogram reassuring with normal EF 6/24.  Was also seen and evaluated by pulmonology who were not able to provide clear explanation about shortness of breath with increased physical activity.  May be a result of decreased fitness and body habitus Increase physical activity as tolerated-low intensity aerobic physical activity No plans for ischemic evaluation Continue to monitor Continue weight loss  Paroxysmal atrial fibrillation-compliant with apixaban.  Denies bleeding issues.  Continues to note occasional and intermittent episodes of atrial fibrillation with [employer].   Continue apixaban, metoprolol May take an extra dose of 12.5 mg metoprolol for sustained elevated heart rate over 100.-Reviewed Heart  healthy low-sodium diet-salty 6 reviewed Increase physical activity as tolerated   Familial hyperlipidemia-LDL 41 on 12/22/21. Continue Repatha Heart healthy low-sodium high-fiber diet Increase physical activity as tolerated Continue weight loss   Coronary artery disease-denies anginal symptoms.  Denies exertional chest discomfort.  Noted to have multivessel coronary disease on chest CT. coronary CTA noted a coronary calcium score of 56 and nonobstructive CAD.  She was noted to have mild 25-49% stenosis in her mid RCA and OM1.  She was noted to have minimal 0-24 percent stenosis in her left main, mid LAD, proximal circumflex, mid circumflex and proximal RCA. Continue metoprolol, Repatha Heart healthy low-sodium diet Increase physical activity as tolerated  Dizziness-Notes occasional episodes.  Last for minutes and then dissipate Maintain hydration Change positions slowly Epley maneuvers  Disposition: Follow-up with Dr. Rennis Golden or me in 6 months.   Thomasene Ripple. Dorean Daniello NP-C     08/17/2023, 8:28 AM [facility] [address] Office [phone] Fax [phone]  Notice: This dictation was prepared with Dragon dictation along with smaller phrase technology. Any transcriptional errors that result from this process are unintentional and may not be corrected upon review.  I spent 14 minutes examining this patient, reviewing medications, and using patient centered shared decision making involving her cardiac care.  Prior to her visit I spent greater than 20 minutes reviewing her past medical history,  medications, and prior cardiac tests.

## 2023-08-17 ENCOUNTER — Encounter: Payer: Self-pay | Admitting: General Practice

## 2023-08-17 ENCOUNTER — Ambulatory Visit: Payer: Medicare PPO | Attending: General Practice | Admitting: General Practice

## 2023-08-17 VITALS — BP 136/70 | HR 77 | Ht 62.0 in | Wt 213.8 lb

## 2023-08-17 DIAGNOSIS — I10 Essential (primary) hypertension: Secondary | ICD-10-CM

## 2023-08-17 DIAGNOSIS — R0602 Shortness of breath: Secondary | ICD-10-CM | POA: Diagnosis not present

## 2023-08-17 DIAGNOSIS — R42 Dizziness and giddiness: Secondary | ICD-10-CM

## 2023-08-17 DIAGNOSIS — I2510 Atherosclerotic heart disease of native coronary artery without angina pectoris: Secondary | ICD-10-CM

## 2023-08-17 DIAGNOSIS — R0609 Other forms of dyspnea: Secondary | ICD-10-CM | POA: Diagnosis not present

## 2023-08-17 DIAGNOSIS — I480 Paroxysmal atrial fibrillation: Secondary | ICD-10-CM | POA: Diagnosis not present

## 2023-08-17 DIAGNOSIS — E7849 Other hyperlipidemia: Secondary | ICD-10-CM

## 2023-08-17 MED ORDER — LEVOTHYROXINE SODIUM 100 MCG PO TABS: 100.0000 ug | ORAL_TABLET | Freq: Every day | ORAL | Status: AC

## 2023-08-17 NOTE — Patient Instructions (Addendum)
Medication Instructions:  The current medical regimen is effective;  continue present plan and medications as directed. Please refer to the Current Medication list given to you today.  *If you need a refill on your cardiac medications before your next appointment, please call your pharmacy*  Lab Work: NONE  Other Instructions TRY TO LENGTHEN AEROBIC EXERCISE TIME FOLLOW ATTACHED INCREASED FIBER DIET PLEASE READ AND FOLLOW EPLEY MANEUVERS-ATTACHED  Follow-Up: At [facility], you and your health needs are our priority.  As part of our continuing mission to provide you with exceptional heart care, we have created designated Provider Care Teams.  These Care Teams include your primary Cardiologist (physician) and Advanced Practice Providers (APPs -  Physician Assistants and Nurse Practitioners) who all work together to provide you with the care you need, when you need it.  Your next appointment:   6 month(s)  Provider:   Chrystie Nose, MD  or Edd Fabian, FNP          High-Fiber Eating Plan Fiber, also called dietary fiber, is found in foods such as fruits, vegetables, whole grains, and beans. A high-fiber diet can be good for your health. Your health care provider may recommend a high-fiber diet to help: Prevent trouble pooping (constipation). Lower your cholesterol. Treat the following conditions: Hemorrhoids. This is inflammation of veins in the anus. Inflammation of specific areas of the digestive tract. Irritable bowel syndrome (IBS). This is a problem of the large intestine, also called the colon, that sometimes causes belly pain and bloating. Prevent overeating as part of a weight-loss plan. Lower the risk of heart disease, type 2 diabetes, and certain cancers. What are tips for following this plan? Reading food labels  Check the nutrition facts label on foods for the amount of dietary fiber. Choose foods that have 4 grams of fiber or more per serving. The  recommended goals for how much fiber you should eat each day include: Males 72 years old or younger: 30-34 g. Males over 72 years old: 28-34 g. Females 72 years old or younger: 25-28 g. Females over 72 years old: 22-25 g. Your daily fiber goal is _____________ g. Shopping Choose whole fruits and vegetables instead of processed. For example, choose apples instead of apple juice or applesauce. Choose a variety of high-fiber foods such as avocados, lentils, oats, and pinto beans. Read the nutrition facts label on foods. Check for foods with added fiber. These foods often have high sugar and salt (sodium) amounts per serving. Cooking Use whole-grain flour for baking and cooking. Cook with brown rice instead of white rice. Make meals that have a lot of beans and vegetables in them, such as chili or vegetable-based soups. Meal planning Start the day with a breakfast that is high in fiber, such as a cereal that has 5 g of fiber or more per serving. Eat breads and cereals that are made with whole-grain flour instead of refined flour or white flour. Eat brown rice, bulgur wheat, or millet instead of white rice. Use beans in place of meat in soups, salads, and pasta dishes. Be sure that half of the grains you eat each day are whole grains. General information You can get the recommended amount of dietary fiber by: Eating a variety of fruits, vegetables, grains, nuts, and beans. Taking a fiber supplement if you aren't able to eat enough fiber. It's better to get fiber through food than from a supplement. Slowly increase how much fiber you eat. If you increase the amount  of fiber you eat too quickly, you may have bloating, cramping, or gas. Drink plenty of water to help you digest fiber. Choose high-fiber snacks, such as berries, raw vegetables, nuts, and popcorn. What foods should I eat? Fruits Berries. Pears. Apples. Oranges. Avocado. Prunes and raisins. Dried figs. Vegetables Sweet potatoes.  Spinach. Kale. Artichokes. Cabbage. Broccoli. Cauliflower. Green peas. Carrots. Squash. Grains Whole-grain breads. Multigrain cereal. Oats and oatmeal. Brown rice. Barley. Bulgur wheat. Millet. Quinoa. Bran muffins. Popcorn. Rye wafer crackers. Meats and other proteins Navy beans, kidney beans, and pinto beans. Soybeans. Split peas. Lentils. Nuts and seeds. Dairy Fiber-fortified yogurt. Fortified means that fiber has been added to the product. Beverages Fiber-fortified soy milk. Fiber-fortified orange juice. Other foods Fiber bars. The items listed above may not be all the foods and drinks you can have. Talk to a dietitian to learn more. What foods should I avoid? Fruits Fruit juice. Cooked, strained fruit. Vegetables Fried potatoes. Canned vegetables. Well-cooked vegetables. Grains White bread. Pasta made with refined flour. White rice. Meats and other proteins Fatty meat. Fried chicken or fried fish. Dairy Milk. Cream cheese. Sour cream. Fats and oils Butters. Beverages Soft drinks. Other foods Cakes and pastries. The items listed above may not be all the foods and drinks you should avoid. Talk to a dietitian to learn more. This information is not intended to replace advice given to you by your health care provider. Make sure you discuss any questions you have with your health care provider. Document Revised: 10/02/2022 Document Reviewed: 10/02/2022 Elsevier Patient Education  2024 [employer].

## 2023-09-06 ENCOUNTER — Other Ambulatory Visit: Payer: Self-pay | Admitting: Internal Medicine

## 2023-09-18 NOTE — Progress Notes (Unsigned)
 [facility]  PATIENT: Alyssa [name] DOB: 29-Aug-1950   REASON FOR VISIT: New onset headache HISTORY FROM: Patient   Chief complaint:  No chief complaint on file.     HISTORY OF PRESENT ILLNESS:   Update 09/19/2023 JM: Patient returns for 45-month follow-up visit.  At prior visit, complained of new onset left frontal/temporal headache.  MRI brain 04/2023 benign.  MRV declined by insurance.  ESR/CRP unremarkable.  Previously on Depakote for headache prophylaxis with benefit but eventually discontinued due to elevated LFTs and resolution of headaches, she requested to retry Depakote with plans on repeating LFTs with PCP 1 month after starting.  Patient sent MyChart message reporting worsening headaches after taking Depakote therefore was switched to zonisamide 50 mg twice daily in August and increased to 100 mg twice daily in September.       History provided for reference purposes only Update 03/19/2023 JM: Patient returns for follow-up visit due to return of headaches.  Previously seen 10/2021 and reported occasional mild type headaches without prophylactic therapy.  Reports new onset headache in July, present left periorbital, frontal and temporal. At first, only present at night but over the past month, headaches have been daily, rates 10/10 pain level.  Difficulty to look at screens, has photophobia and phonophobia. No current N/V. Feels different from previously experienced headaches.  Still some mild short-term memory difficulties but stable.  She has had some decline in left eye vision, does have known cataract, was seen by ophthalmology ([facility] Dr. Lorin Picket) in June with some worsening of cataract and possible need of surgery.  She has been using Excedrin and Tylenol at least 4 times per day over the past month but denies any benefit, use of ice without benefit.  Denies any recent medication changes.  Routinely followed by psychiatry currently on doxepin for  insomnia and Fetzima for depression.  Reports diabetes under good control on Ozempic, recent A1c 5.6.  Routinely follows with cardiology for A-fib currently on Eliquis.  Update 11/08/2021 JM: patient returns for 6 month follow up. Overall stable since prior visit with Dr. Pearlean Brownie. She has not had any additional severe headaches since prior visit. She will get occasional mild tension headaches approx 2x per week. Occasionally will need to stop what she is doing, will use Excedrin migraine with benefit.  She continues to remain off Depakote which she was previously on for headaches.  Has noticed mood fluctuation since discontinuing, she is scheduled to see a psychiatrist next week to further discuss. Balance has been stable since prior visit, no recent falls.  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  No new concerns at this time.  Update 04/27/2021 Dr. Pearlean Brownie : She returns for follow-up after last visit 2 months ago with Alyssa [name].  Patient did not tolerate amitriptyline as she felt disoriented and had brain fog and discontinued it.  She has also stopped Depakote more than a month ago with headaches in fact seem to be improving.  Headaches are not as frequent and occur rarely once a week or so she is scared to take any medicines.  Headaches are moderate in severity and generalized aching in nature with some posterior neck tightness.  Headache is not accompanied by nausea, vomiting, light or sound sensitivity or any visual symptoms.  Patient feels her balance is also better she has had no falls or injuries.  She had MRI scan of the brain done on 03/11/2021 which I personally reviewed shows mild  changes of chronic small vessel disease and a partially empty sella.  No acute abnormalities noted.  She had cholesterol profile on 02/22/2021 which showed elevated total cholesterol of 250, LDL of 184 and low HDL of 49.  Hemoglobin A1c was 6.1.  Her cardiologist Dr. Rennis Golden has started her on Repatha  injections which she is tolerating well without any side effects.  She has no new complaints today.  Update 02/28/2021 JM: Alyssa [name] returns for routine 36-month follow-up.  Since prior visit, she reports having daily headaches as well as fatigue, imbalance and difficulty with word recall over the past 2 weeks - questions possible medication side effect to new BP meds started approx 1 month ago (added chlorthalidone and lowered amlodipine dose to 5mg  due to edema). Plans to discuss further with Dr. Rennis Golden.  Does report improvement in her blood pressure - today 144/88 previously SBPs 180-200s.  Reports some issues with speech initially after head injury which improved.  Cognition has been stable and continues to use compensation strategies although she is concerned regarding possible progression.  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  Reports difficulty with balance over the past month especially with use of steps or curbs or uneven ground (such as in the yard). Reports 2 falls thankfully without injury.  Generalized headache present daily which lasts all day. Can increase in severity at times and will take Tylenol with only limited benefit.  Denies photophobia or phonophobia.  Continues on Depakote but concerned regarding increase liver enzymes (AST 84, ALT 124).  No further concerns at this time.  Update 08/25/2020 JM: Alyssa [name] returns for 34-month follow-up with history of postconcussion syndrome.  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 She has remained on depakote ER 500mg  nightly but reports since November she has been experiencing occasional headaches upon awakening. She will take tylenol with occasional benefit.  She also reports insomnia.  Reports previously undergoing sleep study approximately 10 years ago and was negative for sleep apnea.  She denies snoring or any witnessed apnea.  She has been having difficulty with blood pressure control and PCP recently  switched hydrochlorothiazide to losartan.  Blood pressure today 168/92 -occasionally monitors at home.  PCP is also working her up for elevated liver enzymes and has scheduled ultrasound 2/7.  No further concerns at this time.  Update 02/23/2020 JM: Alyssa [name] returns for postconcussion syndrome with memory loss and headaches.  Continues on Depakote ER 500 mg nightly with benefit of headache management without side effects.  Memory loss has been stable without worsening.  Since prior visit, underwent uncomplicated cervical spinal fusion on 01/16/2020.  Also started on Adderall by PCP for possible ADHD and mood swings with great improvement.  No concerns at this time.  Update 08/26/2019 JM: Alyssa [name] is a 73 year old female who is being seen today for 1 year follow-up regarding postconcussion syndrome with memory loss and headaches.  At prior visit, she was doing well in regards to stable headaches and memory.  She endorses worsening headaches since the beginning of December which are generalized associated with photophobia and phonophobia with similar characteristics compared to her prior headaches.  She has been using Tylenol 1000 mg twice daily with only mild benefit.  She does endorse increased stress as she is a high school special ed teacher and had difficulty with the virtual learning as she felt "isolated".  She has now since returned to in class learning and has been feeling better  since that time but she continues to experience short-term memory loss and is concerned regarding overall worsening memory. MMSE today 29/30.  She continues to use Depakote DR 500 mg nightly but previously on ER formulation.  She denies depression or anxiety and denies any history or family history.  She does endorse increased stress.  Recently started on Repatha by cardiology with excellent improvement of cholesterol levels.  Blood pressure today satisfactory at 132/84.  No further concerns at this time.  UPDATE 1/2/2020CM Ms.  [name], 73 year old female returns for follow-up with history of memory loss postconcussion syndrome and headaches.  She is currently on Depakote 500 mg at night with good control of her headaches however she does wake up with some morning headaches occasionally.  She occasionally takes Tylenol with relief.  She denies any daytime drowsiness or snoring.  She claims she feels the best she has in a long time she is exercising.  She continues to work she teaches in [place] special education.  She denies any issues with her job performance.  Her mother recently died.  She returns for reevaluation  Update 12/20/2018[name], 73 year old female returns for follow-up with history of headaches and postconcussion syndrome and memory loss.  For her headaches she is taking Depakote but decided to taper herself off of the medication back in the summer, her headaches returned and she resumed the Depakote.  Her headaches are now in good control her memory loss is stable per MMSE.  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  She has not had no issues performing her job.  She returns for reevaluation and refills  UPDATE 07/19/16 CMMs. Alyssa [name], 73 year old female returns for yearly followup.  Patient fell off of the chair at school back in June2015 and hit her head. CT of the head at that time without abnormalities her headaches returned and she started taking her Depakote again with good benefit. Her memory is stable per MMSE. 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  She has not had any issues performing  her job.  She had breast cancer surgery January 2015. She had knee replacement on the right 12/22/2015 She returns for reevaluation and refills  HISTORY: of cognitive changes post  head injury. She was initially evaluated by Dr. Pearlean Brownie in September of 2009. She was operating a push mower when she  was going down a ditch in front of her home and  toppled over the handle and hit her head on the motor of the mower.  At that time she was not sure she loss consciousness or not,she was able to get up go indoors. She did not seek medical help that day, however she did see her primary care the next day when she was complaining of severe headache, appeared to be confused and disoriented and did not recognize the Dr. CT of the head showed no acute abnormality. She has since had an MRI of the brain which was read as normal as well as an EEG that was normal.  She returns today continuing to complain with short-term memory, attention, and  concentration difficulties but she continues to work and she has not had any difficulty performing her job duties.  She says she manages by using sticky notes, so that she will not forget things.  She denies any depression ,she is trying to exercise and has lost weight since last seen. She  does complain of early morning headaches and fatigue, does not feel rested after sleeping although that is better with Trazodone.  She is currently on Depakote 500 daily 2 daily for headaches.Her headaches have returned since she is back to teaching after being off for the summer. She says these are stress related. She does not want to increase medication.    There is no nausea, photophobia, with the headaches. 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  Fell several weeks ago and hit the back of the head. No obvious injury.  See ROS  07/18/12: Returns for followup. MMSE stable. Went off Depakote for a few weeks and headaches returned and mood was terrible. Her PCP gave her a RX to get back on the Depakote. Headaches are stable at present. Continues to work with out problems with performing job. Had hernia repair in March and complication of PE after surgery. Has just gotten off coumadin. No new complaints   REVIEW OF SYSTEMS: Full 14 system review of systems performed and notable  only for those listed, all others are neg:  Constitutional: neg  Cardiovascular: neg Ear/Nose/Throat: neg  Skin: neg Eyes: neg Respiratory: neg Gastroitestinal: neg  Hematology/Lymphatic: neg  Endocrine: neg Musculoskeletal:neg Allergy/Immunology: neg Neurological:  headaches Psychiatric: neg Sleep : neg   ALLERGIES: Allergies  Allergen Reactions   Aleve [Naproxen Sodium] Hives, Itching and Swelling   Aromasin [Exemestane] Anaphylaxis, Itching and Swelling    Pt currently tolerating medication well (02/03/16)-gwd; Pt stated "I can take this medication for a few months at a time and I take a benadryl when I feel it starting to come on" Patient has facial swelling, itching, and throat swelling  07/12/17  Taking this right now and no problem. sy/rn   Letrozole Shortness Of Breath, Rash and Other (See Comments)    Swelling - feet, eyes,hands;   Speech garbled   Levofloxacin Rash and Swelling    Facial swelling and red facial rash   Penicillins Hives, Swelling and Rash    All over the body. Has patient had a PCN reaction causing immediate rash, facial/tongue/throat swelling, SOB or lightheadedness with hypotension: Yes Has patient had a PCN reaction causing severe rash involving mucus membranes or skin necrosis: No Has patient had a PCN reaction that required hospitalization No Has patient had a PCN reaction occurring within the last 10 years: No If all of the above answers are "NO", then may proceed with Cephalosporin use.    Symbicort [Budesonide-Formoterol Fumarate] Anaphylaxis, Hives, Swelling and Rash   Codeine Other (See Comments)    hallucinations   Percocet [Oxycodone-Acetaminophen] Other (See Comments)    Causes syncope day after medication has been taken.   Vicodin [Hydrocodone-Acetaminophen] Other (See Comments)    Causes syncope day after medication has been taken.   Doxycycline Swelling    Must with  antihistamine   Ezetimibe Other (See Comments)     Myalgias,fatigue   Prednisone Other (See Comments)    Can take with antihistamine/ can take the shot without antihistamine   Cefdinir Rash and Swelling   Losartan Rash    HOME MEDICATIONS: Outpatient Medications Prior to Visit  Medication Sig Dispense Refill   albuterol (VENTOLIN HFA) 108 (90 Base) MCG/ACT inhaler Inhale 2 puffs into the lungs every 6 (six) hours as needed for wheezing or shortness of breath.      amLODipine (NORVASC) 5 MG tablet TAKE ONE TABLET BY MOUTH IN THE MORNING AND ONE TABLET EVERY EVENING 180 tablet  3   amphetamine-dextroamphetamine (ADDERALL) 20 MG tablet Take 20 mg by mouth 2 (two) times daily. Take 20mg  in the morning and 20mg  in the afternoon.     cetirizine (ZYRTEC) 10 MG tablet Take 10 mg by mouth daily.     cholecalciferol (VITAMIN D3) 25 MCG (1000 UNIT) tablet Take 1,000 Units by mouth daily.     Doxepin HCl 6 MG TABS Take 1 tablet by mouth at bedtime.     ELIQUIS 5 MG TABS tablet TAKE 1 TABLET(5 MG) BY MOUTH TWICE DAILY 60 tablet 11   Evolocumab (REPATHA SURECLICK) 140 MG/ML SOAJ INJECT 1 PEN INTO THE SKIN EVERY 14 DAYS 2 mL 11   Levomilnacipran HCl ER (FETZIMA) 80 MG CP24 Take 80 mg by mouth daily.     levothyroxine (SYNTHROID) 100 MCG tablet Take 1 tablet (100 mcg total) by mouth daily before breakfast.     metoprolol succinate (TOPROL-XL) 25 MG 24 hr tablet TAKE 1/2 TABLET(12.5 MG) BY MOUTH DAILY. MAY TAKE EXTRA 1/2 TABLET FOR SUSTAINED HEART RATE>100 FOR 45 MINUTES 45 tablet 3   nystatin-triamcinolone (MYCOLOG II) cream Apply 1 Application topically 4 (four) times daily.     omeprazole (PRILOSEC) 40 MG capsule TAKE 1 CAPSULE BY MOUTH DAILY (Patient taking differently: Take 40 mg by mouth in the morning and at bedtime.) 30 capsule 0   oxybutynin (DITROPAN-XL) 10 MG 24 hr tablet Take 20 mg by mouth at bedtime.     Semaglutide, 2 MG/DOSE, (OZEMPIC, 2 MG/DOSE,) 8 MG/3ML SOPN Inject 2 mg into the skin every Sunday.     spironolactone (ALDACTONE) 25 MG tablet  Take 1 tablet (25 mg total) by mouth daily. 90 tablet 3   vitamin E 180 MG (400 UNITS) capsule Take 400 Units by mouth daily.     WIXELA INHUB 250-50 MCG/ACT AEPB INHALE 1 PUFF INTO THE LUNGS EVERY 12 HOURS 60 each 2   zonisamide (ZONEGRAN) 100 MG capsule Take 1 capsule (100 mg total) by mouth 2 (two) times daily. 60 capsule 5   No facility-administered medications prior to visit.    PAST MEDICAL HISTORY: Past Medical History:  Diagnosis Date   Abdominal pain    Anxiety    Arthritis    Asthma    Breast cancer (HCC) 06/2013   left upper inner   Cancer (HCC)    breast   Diabetes mellitus without complication (HCC)    Takes Tradjenta   Family history of adverse reaction to anesthesia    Patients mother had to be resusciated after having anesthesia   GERD (gastroesophageal reflux disease)    Headache(784.0)    Takes Depakote   Hernia, incisional periumbilical, incarcerated 08/07/2011   Hot flashes    Hx of radiation therapy 09/09/13- 10/13/13   left breast 5000 cGy in 25 sessions, no boost   Hyperlipidemia    Hypertension    PCP DR Creta Levin   Hypothyroidism    Non-alcoholic fatty liver disease    Peripheral vascular disease (HCC)    pulmonary embolis-still have some   Post concussion syndrome    4 yrs ago- sees [facility]- Dr Pearlean Brownie every 6 months-   has sleep study 2011 following accident but was neg per patient-  short term memory issues, dates and times   Pulmonary embolism (HCC) 2013   Recurrent upper respiratory infection (URI)    FLU 08/23/11-08/29/11 then URI 08/29/11- 09/08/11- S/P zpack and steroids-  improved      no cough or congestion   Thyroid  disease    Thyroid mass, left inferior lobe, 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  Dr. Daphine Deutscher   BREAST BIOPSY     BREAST LUMPECTOMY Left 2015   radiation   BREAST LUMPECTOMY WITH NEEDLE LOCALIZATION AND AXILLARY SENTINEL LYMPH NODE BX Left 08/04/2013   Procedure: BREAST LUMPECTOMY WITH NEEDLE LOCALIZATION AND AXILLARY SENTINEL LYMPH NODE Biopsy x 3;  Surgeon: Emelia Loron, MD;  Location: [facility] OR;  Service: General;  Laterality: Left;   BREAST SURGERY     CARDIOVERSION N/A 07/26/2022   Procedure: CARDIOVERSION;  Surgeon: Maisie Fus, MD;  Location: [facility] ENDOSCOPY;  Service: Cardiovascular;  Laterality: N/A;   CARPAL TUNNEL RELEASE  10/2009-12/2010   left - right   CESAREAN SECTION  X 2   COLONOSCOPY     DIAGNOSTIC LAPAROSCOPY     Lap. chole., appendectomy, hernia repair   HERNIA REPAIR  09/15/11   Ventral w/mesh   JOINT REPLACEMENT     KNEE ARTHROPLASTY Right 12/22/2015   Procedure: COMPUTER ASSISTED TOTAL KNEE ARTHROPLASTY;  Surgeon: Eldred Manges, MD;  Location: [facility] OR;  Service: Orthopedics;  Laterality: Right;   LAPAROSCOPIC CHOLECYSTECTOMY  2006   Dr. Randon Goldsmith TUNNEL RELEASE Bilateral    THYROIDECTOMY, PARTIAL     VENTRAL HERNIA REPAIR  09/29/2011   Procedure: LAPAROSCOPIC VENTRAL HERNIA;  Surgeon: Ardeth Sportsman, MD;  Location: [facility] ORS;  Service: General;  Laterality: N/A;  Laparoscopic Ventral Wall Hernia Repair with Mesh    FAMILY HISTORY: Family History  Problem Relation Age of Onset   Malignant hyperthermia Mother    Heart attack Father    Stroke Father    Diabetes Other        Grandmother   Other Other        respiratory- Grandmother   Cancer Maternal Grandmother 63       breast   Breast cancer Maternal Grandmother     SOCIAL HISTORY: Social History   Socioeconomic History   Marital status: Divorced    Spouse name: Not on file   Number of children: 2   Years of education: Master's   Highest education level: Not on  file  Occupational History    Employer: [employer]  Tobacco Use   Smoking status: Never   Smokeless tobacco: Never  Vaping Use   Vaping status: Never Used  Substance and Sexual Activity   Alcohol use: No    Alcohol/week: 1.0 standard drink of alcohol    Types: 1 Glasses of wine per week   Drug use: No   Sexual activity: Yes    Comment: Menarche age 26, first live birth age 81,  GX P2. She stopped having periods approximately 1999 and took HRT until 2005.  Other Topics Concern   Not on file  Social History Narrative   Patient is divorced and lives alone.   Patient drinks 3-4 cups of caffeine daily.    Patient is right handed.   Social Drivers of Corporate investment banker Strain: Low Risk  (04/05/2022)   Received from [facility], [facility]   Overall Financial Resource Strain (CARDIA)  Difficulty of Paying Living Expenses: Not hard at all  Food Insecurity: Low Risk  (03/09/2023)   Received from [facility]   Hunger Vital Sign    Worried About Running Out of Food in the Last Year: Never true    Ran Out of Food in the Last Year: Never true  Transportation Needs: Not on file (03/09/2023)  Physical Activity: Sufficiently Active (02/25/2022)   Received from [facility], [facility] visits prior to 09/23/2022., [facility] visits prior to 09/23/2022., [facility]   Exercise Vital Sign    Days of Exercise per Week: 4 days    Minutes of Exercise per Session: 40 min  Stress: Stress Concern Present (02/25/2022)   Received from [facility] visits prior to 09/23/2022., [facility], [facility] visits prior to 09/23/2022., [facility]   [employer] of Occupational Health - Occupational Stress Questionnaire    Feeling of Stress : Rather much  Social Connections: Socially Integrated (02/25/2022)   [facility] from [facility] visits prior to 09/23/2022., [facility], [facility] visits prior to 09/23/2022., [facility]   Social Connection and Isolation Panel [NHANES]    Frequency of Communication with Friends and Family: More than three times a week    Frequency of Social Gatherings with Friends and Family: More than three times a week    Attends Religious Services: More than 4 times per year    Active Member of [employer] or Organizations: Yes    Attends Banker Meetings: More than 4 times per year    Marital Status: Living with partner  Intimate Partner Violence: Not At Risk (02/25/2022)   Received from [facility] visits prior to 09/23/2022., [facility] visits prior to 09/23/2022.   Humiliation, Afraid, Rape, and Kick questionnaire    Fear of Current or Ex-Partner: No    Emotionally Abused: No    Physically Abused: No    Sexually Abused: No     PHYSICAL EXAM  There were no vitals filed for this visit.  There is no height or weight on file to calculate BMI.  Generalized: Well developed, pleasant very pleasant middle-aged female in no acute distress   Head: normocephalic and atraumatic,. Oropharynx benign   Musculoskeletal: No deformity   Neurological examination  Mentation: Awake and alert.  Follows all commands.  Speech and language fluent.  Short-term memory mildly impaired and long-term memory intact.  Attention and fund of knowledge appropriate. Cranial Nerves: Pupils equal, briskly reactive to light. Extraocular movements full without nystagmus. Visual fields full to confrontation. Hearing intact. Facial sensation intact. Face, tongue, palate moves normally and symmetrically.  Motor: Normal bulk and tone. Normal strength in all tested extremity muscles. Sensory.: intact to touch , pinprick , position and vibratory sensation.  Coordination: Rapid alternating movements normal in all extremities. Finger-to-nose and heel-to-shin performed accurately bilaterally. Gait  and Station: Arises from chair without difficulty. Stance is normal. Gait demonstrates normal stride length with mild imbalance without use of assistive device Reflexes: 1+ and symmetric. Toes downgoing.        ASSESSMENT AND PLAN  73 y.o. year old female with new onset daily left frontal/temporal headaches over the past 2 months. History of concussion in 2015 with postconcussive syndrome with residual mild headaches but current headache different from previously experienced headaches.     New onset left frontal/temporal headache:  Obtain  MRI brain and MR venogram for further evaluation Obtain ESR/CRP to rule out temporal arteritis although low suspicion Discussed medication management -previously on Depakote for headache prophylaxis but eventually discontinued due to concern of slightly elevated LFTs. She wishes to retry Depakote and is scheduled for LFTs next month with PCP - she is aware of increased risk of elevated LFT's on medication, she will notify us if repeat levels show any elevation compared to prior levels in April (AST 30, ALT 47).  If unable to tolerate Depakote or no benefit, could consider trialing zonisamide, gabapentin or topiramate    Follow-up in 6 months or call earlier if needed    CC:   PCP: Gwenlyn Found, MD   [facility] provider: Dr. Pearlean Brownie   I spent 40 minutes of face-to-face and non-face-to-face time with patient.  This included previsit chart review, lab review, study review, electronic health record documentation, patient education and discussion regarding postconcussive syndrome and current mild headaches and answered all other questions to patient's satisfaction   Ihor Austin, [facility]  [facility] [address] [place], [place] [address]  Phone [phone] Fax [phone] Note: This document was prepared with digital dictation and possible smart phrase technology. Any transcriptional errors that result from  this process are unintentional.

## 2023-09-19 ENCOUNTER — Ambulatory Visit: Payer: Medicare PPO | Admitting: Adult Health

## 2023-09-19 ENCOUNTER — Encounter: Payer: Self-pay | Admitting: Adult Health

## 2023-09-19 VITALS — BP 130/79 | HR 81 | Ht 62.0 in | Wt 210.0 lb

## 2023-09-19 DIAGNOSIS — G43011 Migraine without aura, intractable, with status migrainosus: Secondary | ICD-10-CM | POA: Diagnosis not present

## 2023-09-19 MED ORDER — ZONISAMIDE 50 MG PO CAPS: 50.0000 mg | ORAL_CAPSULE | Freq: Every day | ORAL | 5 refills | Status: DC

## 2023-09-19 MED ORDER — ZONISAMIDE 100 MG PO CAPS: 100.0000 mg | ORAL_CAPSULE | Freq: Two times a day (BID) | ORAL | 5 refills | Status: DC

## 2023-09-19 MED ORDER — NURTEC 75 MG PO TBDP: 75.0000 mg | ORAL_TABLET | ORAL | 11 refills | Status: DC | PRN

## 2023-09-19 NOTE — Patient Instructions (Addendum)
 Your Plan:  Increase zonisamide to 150mg  twice daily - please call after 3-4 weeks if no benefit or sooner if difficulty tolerating  Next step would be to start amitriptyline nightly, if that does not help, we can proceed with monthly injection  Start Nurtec at onset of migraine headache, max dose is 1 tablet per day     Follow up in 6 months or call earlier if needed      Thank you for coming to see [place] at [facility]. I hope we have been able to provide you high quality care today.  You may receive a patient satisfaction survey over the next few weeks. We would appreciate your feedback and comments so that we may continue to improve ourselves and the health of our patients.

## 2023-10-04 ENCOUNTER — Telehealth: Payer: Self-pay | Admitting: Pharmacy Technician

## 2023-10-04 ENCOUNTER — Encounter: Payer: Self-pay | Admitting: Nurse Practitioner

## 2023-10-04 ENCOUNTER — Other Ambulatory Visit (HOSPITAL_COMMUNITY): Payer: Self-pay

## 2023-10-04 NOTE — Telephone Encounter (Signed)
 Pharmacy Patient Advocate Encounter   Received notification from CoverMyMeds that prior authorization for Nurtec 75MG  dispersible tablets is required/requested.   Insurance verification completed.   The patient is insured through [employer] .   Per test claim: PA required; PA submitted to above mentioned insurance via CoverMyMeds Key/confirmation #/EOC [id number] Status is pending

## 2023-10-05 NOTE — Telephone Encounter (Signed)
 Pharmacy Patient Advocate Encounter  Received notification from [facility] that Prior Authorization for Nurtec 75MG  dispersible tablets  has been DENIED.  Full denial letter will be uploaded to the media tab. See denial reason below.   PA #/Case ID/Reference #: [id number]

## 2023-10-07 ENCOUNTER — Encounter: Payer: Self-pay | Admitting: Adult Health

## 2023-10-08 ENCOUNTER — Other Ambulatory Visit: Payer: Self-pay | Admitting: *Deleted

## 2023-10-08 MED ORDER — ZONISAMIDE 100 MG PO CAPS: 100.0000 mg | ORAL_CAPSULE | Freq: Two times a day (BID) | ORAL | 5 refills | Status: DC

## 2023-10-08 MED ORDER — UBRELVY 100 MG PO TABS: 100.0000 mg | ORAL_TABLET | ORAL | 5 refills | Status: DC | PRN

## 2023-10-08 NOTE — Addendum Note (Signed)
 Addended by: Judi Cong on: 10/08/2023 08:39 AM   Modules accepted: Orders

## 2023-10-08 NOTE — Telephone Encounter (Signed)
 Per Shanda Bumps will try ubrelvy. Order sent in for the pt

## 2023-10-08 NOTE — Telephone Encounter (Signed)
 Last seen on 09/18/22 Follow up scheduled on 03/18/24

## 2023-10-09 ENCOUNTER — Other Ambulatory Visit: Payer: Self-pay | Admitting: Neurology

## 2023-10-09 ENCOUNTER — Telehealth: Payer: Self-pay | Admitting: Adult Health

## 2023-10-09 NOTE — Telephone Encounter (Signed)
 Called the pharmacy and left  detailed message advising that the dose of the medication was increased so that the patient is to take 150 mg twice a day, informed on VM that the two are to be taken together to equal that dose.

## 2023-10-09 NOTE — Telephone Encounter (Signed)
 Pt called needing to speak to the RN regarding her zonisamide (ZONEGRAN) 100 MG capsule and zonisamide (ZONEGRAN) 50 MG capsule RX Pt states that the pharmacy is not understanding that she is on 150mg  all together and will only fill the 50mg  for her. Pt would like to discuss so that this can be straightened out and she can get her full dosage. This is for the [facility] in [place].  Please advise.

## 2023-10-11 ENCOUNTER — Telehealth: Payer: Self-pay

## 2023-10-11 ENCOUNTER — Encounter: Payer: Self-pay | Admitting: Nurse Practitioner

## 2023-10-11 ENCOUNTER — Other Ambulatory Visit (HOSPITAL_COMMUNITY): Payer: Self-pay

## 2023-10-11 NOTE — Telephone Encounter (Signed)
 Pharmacy Patient Advocate Encounter   Received notification from Fax that prior authorization for Ubrelvy 100MG  tablets is required/requested.   Insurance verification completed.   The patient is insured through [employer] .   Per test claim: PA required; PA submitted to above mentioned insurance via CoverMyMeds Key/confirmation #/EOC [id number] Status is pending

## 2023-10-11 NOTE — Telephone Encounter (Signed)
 Pharmacy Patient Advocate Encounter  Received notification from [facility] that Prior Authorization for Ubrelvy 100MG  tablets has been APPROVED from 10/11/2023 to 07/23/2024. Unable to obtain price due to refill too soon rejection, last fill date 10/11/2023 next available fill date4/06/2024   PA #/Case ID/Reference #: PA Case ID #: [id number]

## 2023-10-22 ENCOUNTER — Encounter: Payer: Self-pay | Admitting: Nurse Practitioner

## 2023-10-22 ENCOUNTER — Telehealth: Payer: Self-pay | Admitting: Adult Health

## 2023-10-22 ENCOUNTER — Other Ambulatory Visit (HOSPITAL_COMMUNITY): Payer: Self-pay

## 2023-10-22 ENCOUNTER — Other Ambulatory Visit: Payer: Self-pay | Admitting: Neurology

## 2023-10-22 MED ORDER — NURTEC 75 MG PO TBDP: 75.0000 mg | ORAL_TABLET | ORAL | 5 refills | Status: AC | PRN

## 2023-10-22 MED ORDER — NURTEC 75 MG PO TBDP: 75.0000 mg | ORAL_TABLET | ORAL | Status: DC | PRN

## 2023-10-22 NOTE — Telephone Encounter (Signed)
 Called the pharmacy. The zonisamide 100 mg was picked up 10/12/2023 and the 50 mg was last filled on 09/23/2023 for 60 tab and so the refill will be due on 10/24/2023.

## 2023-10-22 NOTE — Telephone Encounter (Signed)
 Pt said, out of zonisamide (ZONEGRAN) 50 MG capsule. Pharmacy will not refill due to a mix up when started taking to medication. Their saying too early for refill.

## 2023-10-22 NOTE — Telephone Encounter (Signed)
 Yes please. Thank you

## 2023-10-22 NOTE — Telephone Encounter (Signed)
 Patient asking if can come by the office to pick up samples of Nurtec. Have had a allergic reaction to Ubrevly. Would like a call back.

## 2023-10-23 NOTE — Telephone Encounter (Signed)
 Spoke with pharmacy. They do have the 50 mg and 100 mg zonisamide prescriptions. The 50 mg is due to be refilled on 10/25/23 and the 100 mg is due to be refilled 4/9. I called the pt and LVM (ok per DPR) advising her of the refill schedules that I was given by pharmacy. I also sent her a Clinical cytogeneticist message.

## 2023-10-23 NOTE — Telephone Encounter (Signed)
 Pt said the pharmacy does not have prescription for zonisamide (ZONEGRAN) 50 MG capsule with taking 2 times daily. Would like a prescription send with directions taking 50 mg two daily.

## 2023-10-24 ENCOUNTER — Other Ambulatory Visit (HOSPITAL_COMMUNITY): Payer: Self-pay

## 2023-10-24 ENCOUNTER — Telehealth: Payer: Self-pay

## 2023-10-24 NOTE — Telephone Encounter (Signed)
Can you appeal?

## 2023-10-24 NOTE — Telephone Encounter (Signed)
 CMM would not let me submit due to the prior denial in  [employer] called [employer] and they stated we can not resubmit due to the previous denial and the only option is to appeal. Please advise

## 2023-10-25 ENCOUNTER — Telehealth: Payer: Self-pay | Admitting: Pharmacist

## 2023-10-25 NOTE — Telephone Encounter (Signed)
 Noted.

## 2023-10-25 NOTE — Telephone Encounter (Signed)
 E-Appeal has been submitted for Nurtec. Will advise when response is received, please be advised that most companies may take 30 days to make a decision. Appeal letter and supporting documentation have been uploaded and submitted via CMM website on 10/25/2023 @1 :06 pm.    Thank you, Dellie Burns, PharmD Clinical Pharmacist  [place]  Direct Dial: [phone]

## 2023-10-25 NOTE — Telephone Encounter (Signed)
 Will forward to our Pharmacist for an appeals review.

## 2023-10-30 ENCOUNTER — Other Ambulatory Visit (HOSPITAL_COMMUNITY): Payer: Self-pay

## 2023-10-30 ENCOUNTER — Other Ambulatory Visit: Payer: Self-pay | Admitting: Neurology

## 2023-10-30 NOTE — Telephone Encounter (Signed)
 Marland Kitchen

## 2023-11-19 ENCOUNTER — Encounter: Payer: Self-pay | Admitting: Adult Health

## 2023-11-20 ENCOUNTER — Encounter: Payer: Self-pay | Admitting: *Deleted

## 2023-11-20 NOTE — Telephone Encounter (Signed)
 Can you let her please be provided noting that patient is routinely followed for chronic headaches and short-term memory difficulties postconcussion.  It can be noted that patient reports being easily distracted and suffers from impulsive decision making also related to her prior concussion.  Please let her know when letter is ready to be picked up.  Thank you.

## 2023-11-26 ENCOUNTER — Ambulatory Visit
Admission: RE | Admit: 2023-11-26 | Discharge: 2023-11-26 | Disposition: A | Discharge status: Home / Self Care | Payer: Medicare PPO | Source: Ambulatory Visit | Attending: Family Medicine | Admitting: Family Medicine

## 2023-11-26 DIAGNOSIS — Z780 Asymptomatic menopausal state: Secondary | ICD-10-CM

## 2023-12-06 ENCOUNTER — Other Ambulatory Visit: Payer: Self-pay | Admitting: Internal Medicine

## 2023-12-26 ENCOUNTER — Other Ambulatory Visit: Payer: Self-pay | Admitting: Family Medicine

## 2023-12-26 DIAGNOSIS — Z1231 Encounter for screening mammogram for malignant neoplasm of breast: Secondary | ICD-10-CM

## 2023-12-28 ENCOUNTER — Ambulatory Visit
Admission: RE | Admit: 2023-12-28 | Discharge: 2023-12-28 | Disposition: A | Discharge status: Home / Self Care | Source: Ambulatory Visit | Attending: Family Medicine | Admitting: Family Medicine

## 2023-12-28 DIAGNOSIS — Z1231 Encounter for screening mammogram for malignant neoplasm of breast: Secondary | ICD-10-CM

## 2024-01-04 ENCOUNTER — Ambulatory Visit: Payer: Self-pay

## 2024-01-04 NOTE — Telephone Encounter (Signed)
 Pt notified on VM she was due a follow-up in 08/2023 she will need to be scheduled for an appt and if things get worse over the weekend she should proceed to the ER

## 2024-01-04 NOTE — Telephone Encounter (Signed)
 Copied from CRM [id number]. Topic: Clinical - Red Word Triage >> Jan 04, 2024 10:58 AM Corean Deutscher wrote: Red Word that prompted transfer to Nurse Triage: Shortness of breath, cough, and can't breath.   Reason for Disposition  [1] Longstanding difficulty breathing AND [2] not responding to usual therapy  Answer Assessment - Initial Assessment Questions 1. RESPIRATORY STATUS: Describe your breathing? (e.g., wheezing, shortness of breath, unable to speak, severe coughing)      Shortness of breath  2. ONSET: When did this breathing problem begin?      2-3 weeks ago  3. PATTERN Does the difficult breathing come and go, or has it been constant since it started?      Constant  4. SEVERITY: How bad is your breathing? (e.g., mild, moderate, severe)    - MILD: No SOB at rest, mild SOB with walking, speaks normally in sentences, can lie down, no retractions, pulse < 100.    - MODERATE: SOB at rest, SOB with minimal exertion and prefers to sit, cannot lie down flat, speaks in phrases, mild retractions, audible wheezing, pulse 100-120.    - SEVERE: Very SOB at rest, speaks in single words, struggling to breathe, sitting hunched forward, retractions, pulse > 120      Moderate  5. RECURRENT SYMPTOM: Have you had difficulty breathing before? If Yes, ask: When was the last time? and What happened that time?      Yes 6. CARDIAC HISTORY: Do you have any history of heart disease? (e.g., heart attack, angina, bypass surgery, angioplasty)      No 7. LUNG HISTORY: Do you have any history of lung disease?  (e.g., pulmonary embolus, asthma, emphysema)     Yes 8. CAUSE: What do you think is causing the breathing problem?      Unsure  9. OTHER SYMPTOMS: Do you have any other symptoms? (e.g., dizziness, runny nose, cough, chest pain, fever)     Cough, wheezing  10. O2 SATURATION MONITOR:  Do you use an oxygen saturation monitor (pulse oximeter) at home? If Yes, ask: What is your reading (oxygen  level) today? What is your usual oxygen saturation reading? (e.g., 95%)       No   Patient recently seen by PCP for same, not improving with medications. Patient will contact PCP again but would like advice and treatment options from pulmonary if possible. Please advise.  Protocols used: Breathing Difficulty-A-AH   FYI Only or Action Required?: Action required by provider  Patient is followed in Pulmonology for asthma, last seen on 05/23/2023 by Lind Repine, MD. Called Nurse Triage reporting Shortness of Breath. Symptoms began several weeks ago. Interventions attempted: Rescue inhaler and Maintenance inhaler. Symptoms are: gradually worsening.  Triage Disposition: See HCP Within 4 Hours (Or PCP Triage)  Patient/caregiver understands and will follow disposition?: Yes

## 2024-01-21 ENCOUNTER — Other Ambulatory Visit: Payer: Self-pay | Admitting: Internal Medicine

## 2024-01-21 DIAGNOSIS — E7849 Other hyperlipidemia: Secondary | ICD-10-CM

## 2024-02-03 ENCOUNTER — Other Ambulatory Visit: Payer: Self-pay | Admitting: Internal Medicine

## 2024-02-03 DIAGNOSIS — I480 Paroxysmal atrial fibrillation: Secondary | ICD-10-CM

## 2024-02-03 NOTE — Telephone Encounter (Signed)
 Prescription refill request for Eliquis  received. Indication: a fib/pe Last office visit: 08/17/23 Scr: 0.89 epic 05/24/23 Age: 73 Weight: 95 kg

## 2024-02-25 NOTE — Progress Notes (Unsigned)
 [facility]  PATIENT: Alyssa [name] DOB: 1951/07/12   REASON FOR VISIT: New onset headache HISTORY FROM: Patient   Chief complaint:  No chief complaint on file.     HISTORY OF PRESENT ILLNESS:   Update 02/26/2024 JM: Patient returns for follow-up visit.  At prior visit, increase zonisamide  250 mg twice daily reporting 3 headache days per week and recommended use of Nurtec as needed for rescue.  Insurance denied Nurtec and required trial of Ubrelvy  but patient had allergic reaction to Ubrelvy  therefore switched back to Nurtec.       History provided for reference purposes only Update 09/19/2023 JM: Patient returns for 30-month follow-up visit.  At prior visit, complained of new onset left frontal/temporal headache.  MRI brain 04/2023 benign.  MRV declined by insurance.  ESR/CRP unremarkable.  Previously on Depakote  for headache prophylaxis with benefit but eventually discontinued due to elevated LFTs and resolution of headaches, she requested to retry Depakote  with plans on repeating LFTs with PCP 1 month after starting.  Patient sent MyChart message reporting worsening headaches after taking Depakote  therefore was switched to zonisamide  50 mg twice daily in August and increased to 100 mg twice daily in September.  Currently, reports about 3 headache days a week (previously daily), reports compliance on zonisamide  100 mg twice daily, tolerating without side effects.  Has been trying Excedrin when migraines occur but denies benefit.  Denies overuse of OTC medications.  Migraines start around left eye and at times can spread across forehead, has photophobia and phonophobia.  She can have increased fatigue and overall not feeling right prior to onset of migraine.  Migraines typically lasting 3 to 4 hours and are debilitating.  Update 03/19/2023 JM: Patient returns for follow-up visit due to return of headaches.  Previously seen 10/2021 and reported occasional mild type  headaches without prophylactic therapy.  Reports new onset headache in July, present left periorbital, frontal and temporal. At first, only present at night but over the past month, headaches have been daily, rates 10/10 pain level.  Difficulty to look at screens, has photophobia and phonophobia. No current N/V. Feels different from previously experienced headaches.  Still some mild short-term memory difficulties but stable.  She has had some decline in left eye vision, does have known cataract, was seen by ophthalmology ([facility] Dr. Glendia) in June with some worsening of cataract and possible need of surgery.  She has been using Excedrin and Tylenol  at least 4 times per day over the past month but denies any benefit, use of ice without benefit.  Denies any recent medication changes.  Routinely followed by psychiatry currently on doxepin for insomnia and Fetzima for depression.  Reports diabetes under good control on Ozempic, recent A1c 5.6.  Routinely follows with cardiology for A-fib currently on Eliquis .  Update 11/08/2021 JM: patient returns for 6 month follow up. Overall stable since prior visit with Dr. Rosemarie. She has not had any additional severe headaches since prior visit. She will get occasional mild tension headaches approx 2x per week. Occasionally will need to stop what she is doing, will use Excedrin migraine with benefit.  She continues to remain off Depakote  which she was previously on for headaches.  Has noticed mood fluctuation since discontinuing, she is scheduled to see a psychiatrist next week to further discuss. Balance has been stable since prior visit, no recent falls.  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  No new concerns at this time.  Update  04/27/2021 Dr. Rosemarie : She returns for follow-up after last visit 2 months ago with Alyssa [name].  Patient did not tolerate amitriptyline  as she felt disoriented and had brain fog and discontinued it.  She has  also stopped Depakote  more than a month ago with headaches in fact seem to be improving.  Headaches are not as frequent and occur rarely once a week or so she is scared to take any medicines.  Headaches are moderate in severity and generalized aching in nature with some posterior neck tightness.  Headache is not accompanied by nausea, vomiting, light or sound sensitivity or any visual symptoms.  Patient feels her balance is also better she has had no falls or injuries.  She had MRI scan of the brain done on 03/11/2021 which I personally reviewed shows mild changes of chronic small vessel disease and a partially empty sella.  No acute abnormalities noted.  She had cholesterol profile on 02/22/2021 which showed elevated total cholesterol of 250, LDL of 184 and low HDL of 49.  Hemoglobin A1c was 6.1.  Her cardiologist Dr. Mona has started her on Repatha  injections which she is tolerating well without any side effects.  She has no new complaints today.  Update 02/28/2021 JM: Alyssa [name] returns for routine 32-month follow-up.  Since prior visit, she reports having daily headaches as well as fatigue, imbalance and difficulty with word recall over the past 2 weeks - questions possible medication side effect to new BP meds started approx 1 month ago (added chlorthalidone  and lowered amlodipine  dose to 5mg  due to edema). Plans to discuss further with Dr. Mona.  Does report improvement in her blood pressure - today 144/88 previously SBPs 180-200s.  Reports some issues with speech initially after head injury which improved.  Cognition has been stable and continues to use compensation strategies although she is concerned regarding possible progression.  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  Reports difficulty with balance over the past month especially with use of steps or curbs or uneven ground (such as in the yard). Reports 2 falls thankfully without injury.  Generalized headache present daily which lasts all day. Can increase in  severity at times and will take Tylenol  with only limited benefit.  Denies photophobia or phonophobia.  Continues on Depakote  but concerned regarding increase liver enzymes (AST 84, ALT 124).  No further concerns at this time.  Update 08/25/2020 JM: Alyssa [name] returns for 27-month follow-up with history of postconcussion syndrome.  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 She has remained on depakote  ER 500mg  nightly but reports since November she has been experiencing occasional headaches upon awakening. She will take tylenol  with occasional benefit.  She also reports insomnia.  Reports previously undergoing sleep study approximately 10 years ago and was negative for sleep apnea.  She denies snoring or any witnessed apnea.  She has been having difficulty with blood pressure control and PCP recently switched hydrochlorothiazide  to losartan.  Blood pressure today 168/92 -occasionally monitors at home.  PCP is also working her up for elevated liver enzymes and has scheduled ultrasound 2/7.  No further concerns at this time.  Update 02/23/2020 JM: Alyssa [name] returns for postconcussion syndrome with memory loss and headaches.  Continues on Depakote  ER 500 mg nightly with benefit of headache management without side effects.  Memory loss has been stable without worsening.  Since prior visit, underwent uncomplicated cervical spinal fusion on 01/16/2020.  Also started on Adderall by  PCP for possible ADHD and mood swings with great improvement.  No concerns at this time.  Update 08/26/2019 JM: Alyssa [name] is a 73 year old female who is being seen today for 1 year follow-up regarding postconcussion syndrome with memory loss and headaches.  At prior visit, she was doing well in regards to stable headaches and memory.  She endorses worsening headaches since the beginning of December which are generalized associated with photophobia and phonophobia with similar characteristics compared  to her prior headaches.  She has been using Tylenol  1000 mg twice daily with only mild benefit.  She does endorse increased stress as she is a high school special ed teacher and had difficulty with the virtual learning as she felt isolated.  She has now since returned to in class learning and has been feeling better since that time but she continues to experience short-term memory loss and is concerned regarding overall worsening memory. MMSE today 29/30.  She continues to use Depakote  DR 500 mg nightly but previously on ER formulation.  She denies depression or anxiety and denies any history or family history.  She does endorse increased stress.  Recently started on Repatha  by cardiology with excellent improvement of cholesterol levels.  Blood pressure today satisfactory at 132/84.  No further concerns at this time.  UPDATE 1/2/2020[name], 73 year old female returns for follow-up with history of memory loss postconcussion syndrome and headaches.  She is currently on Depakote  500 mg at night with good control of her headaches however she does wake up with some morning headaches occasionally.  She occasionally takes Tylenol  with relief.  She denies any daytime drowsiness or snoring.  She claims she feels the best she has in a long time she is exercising.  She continues to work she teaches in [place]  special education.  She denies any issues with her job performance.  Her mother recently died.  She returns for reevaluation  Update 12/20/2018[name], 73 year old female returns for follow-up with history of headaches and postconcussion syndrome and memory loss.  For her headaches she is taking Depakote  but decided to taper herself off of the medication back in the summer, her headaches returned and she resumed the Depakote .  Her headaches are now in good control her memory loss is stable per MMSE.  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  She has not had no issues  performing her job.  She returns for reevaluation and refills  UPDATE 07/19/16 CMMs. Alyssa [name], 73 year old female returns for yearly followup.  Patient fell off of the chair at school back in June2015 and hit her head. CT of the head at that time without abnormalities her headaches returned and she started taking her Depakote  again with good benefit. Her memory is stable per MMSE. 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  She has not had any issues performing  her job.  She had breast cancer surgery January 2015. She had knee replacement on the right 12/22/2015 She returns for reevaluation and refills  HISTORY: of cognitive changes post  head injury. She was initially evaluated by Dr. Rosemarie in September of 2009. She was operating a push mower when she was going down a ditch in front of her home and  toppled over the handle and hit her head on the motor of the mower.  At that time she was not sure she loss consciousness or not,she  was able to get up go indoors. She did not seek medical help that day, however she did see her primary care the next day when she was complaining of severe headache, appeared to be confused and disoriented and did not recognize the Dr. CT of the head showed no acute abnormality. She has since had an MRI of the brain which was read as normal as well as an EEG that was normal.  She returns today continuing to complain with short-term memory, attention, and  concentration difficulties but she continues to work and she has not had any difficulty performing her job duties.  She says she manages by using sticky notes, so that she will not forget things.  She denies any depression ,she is trying to exercise and has lost weight since last seen. She does complain of early morning headaches and fatigue, does not feel rested after sleeping although that is better with Trazodone .  She is currently on  Depakote  500 daily 2 daily for headaches.Her headaches have returned since she is back to teaching after being off for the summer. She says these are stress related. She does not want to increase medication.    There is no nausea, photophobia, with the headaches. 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  Fell several weeks ago and hit the back of the head. No obvious injury.  See ROS  07/18/12: Returns for followup. MMSE stable. Went off Depakote  for a few weeks and headaches returned and mood was terrible. Her PCP gave her a RX to get back on the Depakote . Headaches are stable at present. Continues to work with out problems with performing job. Had hernia repair in March and complication of PE after surgery. Has just gotten off coumadin . No new complaints   REVIEW OF SYSTEMS: Full 14 system review of systems performed and notable only for those listed, all others are neg:  Constitutional: neg  Cardiovascular: neg Ear/Nose/Throat: neg  Skin: neg Eyes: neg Respiratory: neg Gastroitestinal: neg  Hematology/Lymphatic: neg  Endocrine: neg Musculoskeletal:neg Allergy/Immunology: neg Neurological:  headaches Psychiatric: neg Sleep : neg   ALLERGIES: Allergies  Allergen Reactions   Aleve [Naproxen Sodium] Hives, Itching and Swelling   Aromasin  [Exemestane ] Anaphylaxis, Itching and Swelling    Pt currently tolerating medication well (02/03/16)-gwd; Pt stated I can take this medication for a few months at a time and I take a benadryl  when I feel it starting to come on Patient has facial swelling, itching, and throat swelling  07/12/17  Taking this right now and no problem. sy/rn   Letrozole  Shortness Of Breath, Rash and Other (See Comments)    Swelling - feet, eyes,hands;   Speech garbled   Levofloxacin Rash and Swelling    Facial swelling and red facial rash   Penicillins Hives, Swelling and Rash    All over the body. Has patient had a PCN reaction causing immediate rash,  facial/tongue/throat swelling, SOB or lightheadedness with hypotension: Yes Has patient had a PCN reaction causing severe rash involving mucus membranes or skin necrosis: No Has patient had a PCN reaction that required hospitalization No Has patient had a PCN reaction occurring within the last 10 years: No If all of the above answers are NO, then may proceed with Cephalosporin use.    Symbicort [Budesonide -Formoterol Fumarate] Anaphylaxis, Hives, Swelling and Rash   Codeine Other (See Comments)    hallucinations   Percocet [Oxycodone -Acetaminophen ] Other (See Comments)    Causes syncope day after medication  has been taken.   Vicodin [Hydrocodone -Acetaminophen ] Other (See Comments)    Causes syncope day after medication has been taken.   Amitriptyline  Itching and Cough    Itching, face redness, cough   Doxycycline Swelling    Must with  antihistamine   Ezetimibe Other (See Comments)    Myalgias,fatigue   Prednisone Other (See Comments)    Can take with antihistamine/ can take the shot without antihistamine   Ubrelvy  [Ubrogepant ] Hives   Cefdinir Rash and Swelling   Losartan Rash    HOME MEDICATIONS: Outpatient Medications Prior to Visit  Medication Sig Dispense Refill   albuterol  (VENTOLIN  HFA) 108 (90 Base) MCG/ACT inhaler Inhale 2 puffs into the lungs every 6 (six) hours as needed for wheezing or shortness of breath.      amLODipine  (NORVASC ) 5 MG tablet TAKE ONE TABLET BY MOUTH IN THE MORNING AND ONE TABLET EVERY EVENING 180 tablet 3   amphetamine -dextroamphetamine  (ADDERALL) 20 MG tablet Take 20 mg by mouth 2 (two) times daily. Take 20mg  in the morning and 20mg  in the afternoon.     cetirizine  (ZYRTEC ) 10 MG tablet Take 10 mg by mouth daily.     cholecalciferol (VITAMIN D3) 25 MCG (1000 UNIT) tablet Take 1,000 Units by mouth daily.     ELIQUIS  5 MG TABS tablet TAKE 1 TABLET(5 MG) BY MOUTH TWICE DAILY 60 tablet 11   Evolocumab  (REPATHA  SURECLICK) 140 MG/ML SOAJ INJECT 140MG (1  PEN) INTO THE SKIN EVERY 14 DAYS 6 mL 3   Levomilnacipran HCl ER (FETZIMA) 80 MG CP24 Take 80 mg by mouth daily.     levothyroxine  (SYNTHROID ) 100 MCG tablet Take 1 tablet (100 mcg total) by mouth daily before breakfast.     metoprolol  succinate (TOPROL -XL) 25 MG 24 hr tablet TAKE 1/2 TABLET(12.5 MG) BY MOUTH DAILY. MAY TAKE EXTRA 1/2 TABLET FOR SUSTAINED HEART RATE>100 FOR 45 MINUTES 45 tablet 3   nystatin-triamcinolone (MYCOLOG II) cream Apply 1 Application topically 4 (four) times daily.     omeprazole  (PRILOSEC) 40 MG capsule TAKE 1 CAPSULE BY MOUTH DAILY (Patient taking differently: Take 40 mg by mouth in the morning and at bedtime.) 30 capsule 0   oxybutynin  (DITROPAN -XL) 10 MG 24 hr tablet Take 20 mg by mouth at bedtime.     Rimegepant Sulfate (NURTEC) 75 MG TBDP Take 1 tablet (75 mg total) by mouth as needed (take 1 tablet at onset of migraine (do not exceed more than 1 tablet in 24 hrs)). 16 tablet 5   Semaglutide, 2 MG/DOSE, (OZEMPIC, 2 MG/DOSE,) 8 MG/3ML SOPN Inject 2 mg into the skin every Sunday.     spironolactone  (ALDACTONE ) 25 MG tablet Take 1 tablet (25 mg total) by mouth daily. 90 tablet 3   tiZANidine (ZANAFLEX) 4 MG tablet Take 4-12 mg by mouth at bedtime.     vitamin E 180 MG (400 UNITS) capsule Take 400 Units by mouth daily.     WIXELA INHUB 250-50 MCG/ACT AEPB INHALE 1 PUFF INTO THE LUNGS EVERY 12 HOURS 60 each 2   zonisamide  (ZONEGRAN ) 100 MG capsule Take 1 capsule (100 mg total) by mouth 2 (two) times daily. Combine with 50mg  capsules BID 60 capsule 5   zonisamide  (ZONEGRAN ) 50 MG capsule Take 1 capsule (50 mg total) by mouth daily. Combine with 100mg  capsules BID 60 capsule 5   No facility-administered medications prior to visit.    PAST MEDICAL HISTORY: Past Medical History:  Diagnosis Date   Abdominal pain    Anxiety  Arthritis    Asthma    Breast cancer (HCC) 06/2013   left upper inner   Cancer (HCC)    breast   Diabetes mellitus without complication (HCC)     Takes Tradjenta    Family history of adverse reaction to anesthesia    Patients mother had to be resusciated after having anesthesia   GERD (gastroesophageal reflux disease)    Headache(784.0)    Takes Depakote    Hernia, incisional periumbilical, incarcerated 08/07/2011   Hot flashes    Hx of radiation therapy 09/09/13- 10/13/13   left breast 5000 cGy in 25 sessions, no boost   Hyperlipidemia    Hypertension    PCP DR Bettie   Hypothyroidism    Non-alcoholic fatty liver disease    Peripheral vascular disease (HCC)    pulmonary embolis-still have some   Post concussion syndrome    4 yrs ago- sees [facility]- Dr Rosemarie every 6 months-   has sleep study 2011 following accident but was neg per patient-  short term memory issues, dates and times   Pulmonary embolism (HCC) 2013   Recurrent upper respiratory infection (URI)    FLU 08/23/11-08/29/11 then URI 08/29/11- 09/08/11- S/P zpack and steroids-  improved      no cough or congestion   Thyroid  disease    Thyroid  mass, left inferior lobe, 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  Dr. Gladis   BREAST BIOPSY     BREAST LUMPECTOMY Left 2015   radiation   BREAST LUMPECTOMY WITH NEEDLE LOCALIZATION AND AXILLARY SENTINEL LYMPH NODE BX Left 08/04/2013   Procedure: BREAST LUMPECTOMY WITH NEEDLE LOCALIZATION AND AXILLARY SENTINEL LYMPH NODE Biopsy x 3;  Surgeon: Donnice Bury, MD;  Location: [facility] OR;  Service: General;  Laterality: Left;   BREAST SURGERY     CARDIOVERSION N/A 07/26/2022   Procedure: CARDIOVERSION;  Surgeon: Alvan Ronal BRAVO, MD;  Location: [facility] ENDOSCOPY;  Service: Cardiovascular;   Laterality: N/A;   CARPAL TUNNEL RELEASE  10/2009-12/2010   left - right   CESAREAN SECTION  X 2   COLONOSCOPY     DIAGNOSTIC LAPAROSCOPY     Lap. chole., appendectomy, hernia repair   HERNIA REPAIR  09/15/11   Ventral w/mesh   JOINT REPLACEMENT     KNEE ARTHROPLASTY Right 12/22/2015   Procedure: COMPUTER ASSISTED TOTAL KNEE ARTHROPLASTY;  Surgeon: Oneil BROCKS Barbarann, MD;  Location: [facility] OR;  Service: Orthopedics;  Laterality: Right;   LAPAROSCOPIC CHOLECYSTECTOMY  2006   Dr. Adel BRAS TUNNEL RELEASE Bilateral    THYROIDECTOMY, PARTIAL     VENTRAL HERNIA REPAIR  09/29/2011   Procedure: LAPAROSCOPIC VENTRAL HERNIA;  Surgeon: Elspeth KYM Schultze, MD;  Location: [facility] ORS;  Service: General;  Laterality: N/A;  Laparoscopic Ventral Wall Hernia Repair with Mesh    FAMILY HISTORY: Family History  Problem Relation Age of Onset   Malignant hyperthermia Mother    Heart attack Father    Stroke Father    Diabetes Other        Grandmother   Other Other        respiratory- Grandmother   Cancer Maternal Grandmother 39  breast   Breast cancer Maternal Grandmother     SOCIAL HISTORY: Social History   Socioeconomic History   Marital status: Divorced    Spouse name: Not on file   Number of children: 2   Years of education: Master's   Highest education level: Not on file  Occupational History    Employer: [employer]  Tobacco Use   Smoking status: Never   Smokeless tobacco: Never  Vaping Use   Vaping status: Never Used  Substance and Sexual Activity   Alcohol use: No    Alcohol/week: 1.0 standard drink of alcohol    Types: 1 Glasses of wine per week   Drug use: No   Sexual activity: Yes    Comment: Menarche age 39, first live birth age 58,  GX P2. She stopped having periods approximately 1999 and took HRT until 2005.  Other Topics Concern   Not on file  Social History Narrative   Patient is divorced and lives alone.   Patient drinks 3-4 cups of caffeine daily.    Patient  is right handed.   Social Drivers of Corporate investment banker Strain: Low Risk  (04/05/2022)   Received from [facility]   Overall Financial Resource Strain (CARDIA)    Difficulty of Paying Living Expenses: Not hard at all  Food Insecurity: Low Risk  (01/01/2024)   Received from [facility]   Hunger Vital Sign    Within the past 12 months, you worried that your food would run out before you got money to buy more: Never true    Within the past 12 months, the food you bought just didn't last and you didn't have money to get more. : Never true  Transportation Needs: No Transportation Needs (01/01/2024)   Received from [facility]    In the past 12 months, has lack of reliable transportation kept you from medical appointments, meetings, work or from getting things needed for daily living? : No  Physical Activity: Sufficiently Active (02/25/2022)   Received from [facility], [facility] visits prior to 09/23/2022.   Exercise Vital Sign    On average, how many days per week do you engage in moderate to strenuous exercise (like a brisk walk)?: 4 days    On average, how many minutes do you engage in exercise at this level?: 40 min  Stress: Stress Concern Present (02/25/2022)   Received from [facility] visits prior to 09/23/2022., [facility]   [employer] of Occupational Health - Occupational Stress Questionnaire    Feeling of Stress : Rather much  Social Connections: Socially Integrated (02/25/2022)   Received from [facility] visits prior to 09/23/2022., [facility]   Social Connection and Isolation Panel    In a typical week, how many times do you talk on the phone with family, friends, or neighbors?: More than three times a week    How often do you get together with friends or relatives?: More than three times a week    How often do you attend church or religious services?: More than 4 times per year     Do you belong to any clubs or organizations such as church groups, unions, fraternal or athletic groups, or school groups?: Yes    How often do you attend meetings of the clubs or organizations you belong to?: More than 4 times per year    Are you married, widowed, divorced, separated,  never married, or living with a partner?: Living with partner  Intimate Partner Violence: Not At Risk (02/25/2022)   Received from [facility] visits prior to 09/23/2022.   Humiliation, Afraid, Rape, and Kick questionnaire    Fear of Current or Ex-Partner: No    Emotionally Abused: No    Physically Abused: No    Sexually Abused: No     PHYSICAL EXAM  There were no vitals filed for this visit.   There is no height or weight on file to calculate BMI.  Generalized: Well developed, pleasant very pleasant middle-aged female in no acute distress   Head: normocephalic and atraumatic,. Oropharynx benign   Musculoskeletal: No deformity   Neurological examination  Mentation: Awake and alert.  Follows all commands.  Speech and language fluent.  Short-term memory mildly impaired and long-term memory intact.  Attention and fund of knowledge appropriate. Cranial Nerves: Pupils equal, briskly reactive to light. Extraocular movements full without nystagmus. Visual fields full to confrontation. Hearing intact. Facial sensation intact. Face, tongue, palate moves normally and symmetrically.  Motor: Normal bulk and tone. Normal strength in all tested extremity muscles. Sensory.: intact to touch , pinprick , position and vibratory sensation.  Coordination: Rapid alternating movements normal in all extremities. Finger-to-nose and heel-to-shin performed accurately bilaterally. Gait and Station: Arises from chair without difficulty. Stance is normal. Gait demonstrates normal stride length with mild imbalance without use of assistive device Reflexes: 1+ and symmetric. Toes downgoing.         ASSESSMENT AND PLAN  73 y.o. year old female with new onset daily left frontal/temporal headaches starting around 12/2022 consistent with migrainous type headaches.  Migraines improved with zonisamide  but still occurring about 12 days/month.     Migraine headache Prevention: Increase zonisamide  to 150 mg twice daily Rescue: start Nurtec 75mg  as needed, max dose 75 mg/24hr. Triptans contraindicated due to history of CAD Next step: CRGP, botox Previously tried/failed: Zonisamide , Depakote , amitriptyline , losartan, metoprolol , acetaminophen , Excedrin, Ubrelvy  (allergic reaction) MRI brain 04/2023 benign ESR/CRP negative     Follow-up in 6 months or call earlier if needed    CC:   PCP: Leila Lucie LABOR, MD    I personally spent a total of *** minutes in the care of the patient today including {Time Based Coding:[id number]}.   Alyssa Bogaert, AGNP-BC  [facility] [address] [place], [place] [address]  Phone [phone] Fax [phone] Note: This document was prepared with digital dictation and possible smart phrase technology. Any transcriptional errors that result from this process are unintentional.

## 2024-02-26 ENCOUNTER — Encounter: Payer: Self-pay | Admitting: Adult Health

## 2024-02-26 ENCOUNTER — Ambulatory Visit: Admitting: Adult Health

## 2024-02-26 ENCOUNTER — Telehealth: Payer: Self-pay | Admitting: Pharmacist

## 2024-02-26 VITALS — BP 143/82 | HR 89 | Ht 62.0 in | Wt 200.0 lb

## 2024-02-26 DIAGNOSIS — G43011 Migraine without aura, intractable, with status migrainosus: Secondary | ICD-10-CM

## 2024-02-26 DIAGNOSIS — G44209 Tension-type headache, unspecified, not intractable: Secondary | ICD-10-CM

## 2024-02-26 MED ORDER — AJOVY 225 MG/1.5ML ~~LOC~~ SOAJ
225.0000 mg | SUBCUTANEOUS | 11 refills | Status: DC
Start: 2024-02-26 — End: 2024-02-28

## 2024-02-26 MED ORDER — AJOVY 225 MG/1.5ML ~~LOC~~ SOAJ
225.0000 mg | SUBCUTANEOUS | 11 refills | Status: DC
Start: 2024-02-26 — End: 2024-02-26

## 2024-02-26 NOTE — Patient Instructions (Addendum)
 Your Plan:  Start Ajovy  monthly injection - will require a prior authorization through insurance. If you do not hear about approval or denial in the next week, please let me know   Continue Nurtec as needed       Follow up in 6 months or call earlier if needed       Thank you for coming to see us  at [facility]. I hope we have been able to provide you high quality care today.  You may receive a patient satisfaction survey over the next few weeks. We would appreciate your feedback and comments so that we may continue to improve ourselves and the health of our patients.    Fremanezumab  Injection What is this medication? FREMANEZUMAB  (fre ma NEZ ue mab) prevents migraines. It works by blocking a substance in the body that causes migraines. It is a monoclonal antibody. This medicine may be used for other purposes; ask your health care provider or pharmacist if you have questions. COMMON BRAND NAME(S): AJOVY  What should I tell my care team before I take this medication? They need to know if you have any of these conditions: An unusual or allergic reaction to fremanezumab , other medications, foods, dyes, or preservatives Pregnant or trying to get pregnant Breast-feeding How should I use this medication? This medication is injected under the skin. You will be taught how to prepare and give it. Take it as directed on the prescription label. Keep taking it unless your care team tells you to stop. It is important that you put your used needles and syringes in a special sharps container. Do not put them in a trash can. If you do not have a sharps container, call your pharmacist or care team to get one. Talk to your care team about the use of this medication in children. Special care may be needed. Overdosage: If you think you have taken too much of this medicine contact a poison control center or emergency room at once. NOTE: This medicine is only for you. Do not share  this medicine with others. What if I miss a dose? If you miss a dose, take it as soon as you can. If it is almost time for your next dose, take only that dose. Do not take double or extra doses. What may interact with this medication? Interactions are not expected. This list may not describe all possible interactions. Give your health care provider a list of all the medicines, herbs, non-prescription drugs, or dietary supplements you use. Also tell them if you smoke, drink alcohol, or use illegal drugs. Some items may interact with your medicine. What should I watch for while using this medication? Tell your care team if your symptoms do not start to get better or if they get worse. What side effects may I notice from receiving this medication? Side effects that you should report to your care team as soon as possible: Allergic reactions or angioedema--skin rash, itching or hives, swelling of the face, eyes, lips, tongue, arms, or legs, trouble swallowing or breathing Side effects that usually do not require medical attention (report to your care team if they continue or are bothersome): Pain, redness, or irritation at injection site This list may not describe all possible side effects. Call your doctor for medical advice about side effects. You may report side effects to FDA at 1-800-FDA-1088. Where should I keep my medication? Keep out of the reach of children and pets. Store in a refrigerator or at room temperature  between 20 and 25 degrees C (68 and 77 degrees F). Refrigeration (preferred): Store in the refrigerator. Do not freeze. Keep in the original container until you are ready to take it. Remove the dose from the carton about 30 minutes before it is time for you to use it. If the dose is not used, it may be stored in the original container at room temperature for 7 days. Get rid of any unused medication after the expiration date. Room Temperature: This medication may be stored at room  temperature for up to 7 days. Keep it in the original container. Protect from light until time of use. If it is stored at room temperature, get rid of any unused medication after 7 days or after it expires, whichever is first. To get rid of medications that are no longer needed or have expired: Take the medication to a medication take-back program. Check with your pharmacy or law enforcement to find a location. If you cannot return the medication, ask your pharmacist or care team how to get rid of this medication safely. NOTE: This sheet is a summary. It may not cover all possible information. If you have questions about this medicine, talk to your doctor, pharmacist, or health care provider.  2024 Elsevier/Gold Standard (2021-09-02 00:00:00)

## 2024-02-26 NOTE — Telephone Encounter (Signed)
 Pharmacy Patient Advocate Encounter   Received notification from Patient Pharmacy that prior authorization for AJOVY  (fremanezumab -vfrm) injection 225MG /1.5ML auto-injectors is required/requested.   Insurance verification completed.   The patient is insured through [employer] .   Per test claim: PA required; PA submitted to above mentioned insurance via CoverMyMeds Key/confirmation #/EOC [id number] Status is pending

## 2024-02-27 NOTE — Telephone Encounter (Signed)
 Pharmacy Patient Advocate Encounter  Received notification from [employer] that Prior Authorization for AJOVY  (fremanezumab -vfrm) injection 225MG /1.5ML auto-injectors has been DENIED.  Full denial letter will be uploaded to the media tab. See denial reason below.   PA #/Case ID/Reference #: [id number]    Unfortunately, your coverage or payment under  your Medicare Part D benefit for AJOVY  225  MG/1.5 ML AUTOINJECT was denied. Here's why we made our decision: We cover this drug when our criteria are met.  The unmet criteria are:  you had previous treatment with Emgality  AND  Qulipta.  This decision was from [employer]'s Calcitonin  gene-related peptide (CGRP) Inhibitors  Pharmacy Coverage Policy at [email or web address].

## 2024-02-27 NOTE — Telephone Encounter (Signed)
 Received a faxed request for add information. Faxed completed form along with clinical notes to [facility] at [phone].

## 2024-02-28 ENCOUNTER — Telehealth: Payer: Self-pay | Admitting: Pharmacist

## 2024-02-28 MED ORDER — EMGALITY 120 MG/ML ~~LOC~~ SOAJ: 120.0000 mg | SUBCUTANEOUS | 11 refills | Status: AC

## 2024-02-28 MED ORDER — EMGALITY 120 MG/ML ~~LOC~~ SOAJ: 240.0000 mg | Freq: Once | SUBCUTANEOUS | 0 refills | Status: AC

## 2024-02-28 NOTE — Telephone Encounter (Signed)
 Pharmacy Patient Advocate Encounter   Received notification from Patient Pharmacy that prior authorization for Emgality  120MG /ML auto-injectors (migraine) is required/requested.   Insurance verification completed.   The patient is insured through [employer] .   Per test claim: PA required; PA submitted to above mentioned insurance via CoverMyMeds Key/confirmation #/EOC  [facility] Status is pending

## 2024-02-28 NOTE — Addendum Note (Signed)
 Addended by: WHITFIELD, Alexis Mizuno L on: 02/28/2024 11:00 AM   Modules accepted: Orders

## 2024-02-28 NOTE — Telephone Encounter (Signed)
 Spoke w/Pt to make her aware her insurance denied Ajovy  because she needed to try Emgality  first. NP sent a script for Emgality  120 mg every 30 days but the loading dose is 240 mg (2 injections). Pt voiced understanding and thanks for the call.

## 2024-02-28 NOTE — Telephone Encounter (Signed)
 Please advise patient.  Replaced order with Emgality  120 mg every 30 days.  Please advise initial dose will be 2 injections (240mg ) for a loading dose. Thank you.

## 2024-02-28 NOTE — Telephone Encounter (Signed)
 Pharmacy Patient Advocate Encounter  Received notification from [employer] that Prior Authorization for Emgality  120MG /ML auto-injectors (migraine) has been APPROVED from 07/25/2023 to 07/23/2024   PA #/Case ID/Reference #: [id number]

## 2024-03-05 ENCOUNTER — Encounter (HOSPITAL_BASED_OUTPATIENT_CLINIC_OR_DEPARTMENT_OTHER): Payer: Self-pay | Admitting: Nurse Practitioner

## 2024-03-05 ENCOUNTER — Ambulatory Visit (HOSPITAL_BASED_OUTPATIENT_CLINIC_OR_DEPARTMENT_OTHER): Admitting: Nurse Practitioner

## 2024-03-05 VITALS — BP 131/73 | HR 72 | Ht 62.0 in | Wt 202.1 lb

## 2024-03-05 DIAGNOSIS — R058 Other specified cough: Secondary | ICD-10-CM

## 2024-03-05 DIAGNOSIS — K219 Gastro-esophageal reflux disease without esophagitis: Secondary | ICD-10-CM | POA: Diagnosis not present

## 2024-03-05 DIAGNOSIS — J4520 Mild intermittent asthma, uncomplicated: Secondary | ICD-10-CM | POA: Insufficient documentation

## 2024-03-05 NOTE — Assessment & Plan Note (Signed)
 Stable with improved dyspnea and resolved cough. See above. Possible GERD exacerbates asthma. At this time, would continue with PRN SABA and monitor symptoms. Action plan in place.
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
## 2024-03-05 NOTE — Assessment & Plan Note (Signed)
 Seems to be upper airway in nature, triggered by reflux/certain foods. Resolved since eliminating gluten. Continue PPI regimen. Monitor cough and correlation with allergy season for possible postnasal drainage/allergy component. Consider allergy testing if this occurs. Cough management. Ok to stay off ICS/LABA. Possible DPI would exacerbate so if ICS indicated, would recommend HFA.   Patient Instructions  Continue Albuterol  inhaler 2 puffs every 6 hours as needed for shortness of breath or wheezing. Notify if symptoms persist despite rescue inhaler/neb use.  Stay off of Wixela  Continue allegra daily for allergies Continue omeprazole  1 tab Twice daily, 30 minutes before meals  Famotidine 20 mg Twice daily as needed for breakthrough reflux   Continue with the elimination diet that you have been doing because this seems to be helping with your cough, which makes me lean towards the cough being related to reflux   If the cough comes back, call me so we can do some further investigating   Follow up in 6 months with Dr. Jude. If symptoms do not improve or worsen, please contact office for sooner follow up or seek emergency care.

## 2024-03-05 NOTE — Patient Instructions (Addendum)
 Continue Albuterol  inhaler 2 puffs every 6 hours as needed for shortness of breath or wheezing. Notify if symptoms persist despite rescue inhaler/neb use.  Stay off of Wixela  Continue allegra daily for allergies Continue omeprazole  1 tab Twice daily, 30 minutes before meals  Famotidine 20 mg Twice daily as needed for breakthrough reflux   Continue with the elimination diet that you have been doing because this seems to be helping with your cough, which makes me lean towards the cough being related to reflux   If the cough comes back, call me so we can do some further investigating   Follow up in 6 months with Dr. Jude. If symptoms do not improve or worsen, please contact office for sooner follow up or seek emergency care.

## 2024-03-05 NOTE — Assessment & Plan Note (Addendum)
 See above. Advised to use famotidine PRN breakthrough. Follow up with GI

## 2024-03-13 ENCOUNTER — Other Ambulatory Visit: Payer: Self-pay | Admitting: Internal Medicine

## 2024-03-18 ENCOUNTER — Ambulatory Visit: Payer: Medicare PPO | Admitting: Adult Health

## 2024-03-19 ENCOUNTER — Other Ambulatory Visit: Payer: Self-pay | Admitting: Nurse Practitioner

## 2024-03-19 DIAGNOSIS — K7581 Nonalcoholic steatohepatitis (NASH): Secondary | ICD-10-CM

## 2024-03-19 DIAGNOSIS — K7401 Hepatic fibrosis, early fibrosis: Secondary | ICD-10-CM

## 2024-03-25 ENCOUNTER — Ambulatory Visit
Admission: RE | Admit: 2024-03-25 | Discharge: 2024-03-25 | Disposition: A | Discharge status: Home / Self Care | Source: Ambulatory Visit | Attending: Nurse Practitioner

## 2024-03-25 ENCOUNTER — Encounter: Payer: Self-pay | Admitting: Nurse Practitioner

## 2024-03-25 DIAGNOSIS — K7581 Nonalcoholic steatohepatitis (NASH): Secondary | ICD-10-CM

## 2024-03-25 DIAGNOSIS — K7401 Hepatic fibrosis, early fibrosis: Secondary | ICD-10-CM

## 2024-04-02 ENCOUNTER — Telehealth: Payer: Self-pay | Admitting: Internal Medicine

## 2024-04-02 NOTE — Telephone Encounter (Signed)
 Pt c/o swelling/edema: STAT if pt has developed SOB within 24 hours  If swelling, where is the swelling located? Lower legs   How much weight have you gained and in what time span? No  Have you gained 2 pounds in a day or 5 pounds in a week? No  Do you have a log of your daily weights (if so, list)? No  Are you currently taking a fluid pill? Yes  Are you currently SOB? Yes, for two or three weeks  Have you traveled recently in a car or plane for an extended period of time? No  Patient c/o Palpitations:  STAT if patient reporting lightheadedness, shortness of breath, or chest pain  How long have you had palpitations/irregular HR/ Afib? Are you having the symptoms now? No   Are you currently experiencing lightheadedness, SOB or CP? No  Do you have a history of afib (atrial fibrillation) or irregular heart rhythm? Yes  Have you checked your BP or HR? (document readings if available):   169/103  174/95   Are you experiencing any other symptoms? No

## 2024-04-02 NOTE — Telephone Encounter (Signed)
 Patient reports that she has had an increase in afib burden over the past couple of weeks. She states that this has caused some fatigue and shortness of breath with minimal exertion. She reports that her blood pressure has also been fluctuating. She has gotten readings as high as 173/101. She also reports swelling in her feet and ankles. She denies any increase in salt intake. Patient has been advised on using extra metoprolol  as needed for elevated heart rates, elevate legs frequently, continue with reduced sodium intake, keep a log of blood pressures. She is coming in for an appointment on 9/15. She will let us  know if anything changes or worsens before then.

## 2024-04-07 ENCOUNTER — Encounter: Payer: Self-pay | Admitting: Internal Medicine

## 2024-04-07 ENCOUNTER — Ambulatory Visit: Attending: Internal Medicine | Admitting: Internal Medicine

## 2024-04-07 VITALS — BP 160/92 | HR 74 | Ht 62.0 in | Wt 204.7 lb

## 2024-04-07 DIAGNOSIS — I10 Essential (primary) hypertension: Secondary | ICD-10-CM | POA: Diagnosis not present

## 2024-04-07 DIAGNOSIS — E7849 Other hyperlipidemia: Secondary | ICD-10-CM | POA: Diagnosis not present

## 2024-04-07 DIAGNOSIS — I480 Paroxysmal atrial fibrillation: Secondary | ICD-10-CM

## 2024-04-07 DIAGNOSIS — R600 Localized edema: Secondary | ICD-10-CM

## 2024-04-07 MED ORDER — SPIRONOLACTONE 50 MG PO TABS: 50.0000 mg | ORAL_TABLET | Freq: Every day | ORAL | 3 refills | Status: AC

## 2024-04-07 MED ORDER — METOPROLOL SUCCINATE ER 25 MG PO TB24: 25.0000 mg | ORAL_TABLET | Freq: Every day | ORAL | 3 refills | Status: DC

## 2024-04-07 NOTE — Patient Instructions (Signed)
 Medication Instructions:  INCREASE spironolactone  to 50mg  daily  INCREASE metoprolol  succinate to 25mg  daily  *If you need a refill on your cardiac medications before your next appointment, please call your pharmacy*  Lab Work: FASTING lipid panel and BMET in 2 weeks  If you have labs (blood work) drawn today and your tests are completely normal, you will receive your results only by: MyChart Message (if you have MyChart) OR A paper copy in the mail If you have any lab test that is abnormal or we need to change your treatment, we will call you to review the results.   Follow-Up: At [facility], you and your health needs are our priority.  As part of our continuing mission to provide you with exceptional heart care, our providers are all part of one team.  This team includes your primary Cardiologist (physician) and Advanced Practice Providers or APPs (Physician Assistants and Nurse Practitioners) who all work together to provide you with the care you need, when you need it.  Your next appointment:    4-6 weeks with PA or NP  We recommend signing up for the patient portal called MyChart.  Sign up information is provided on this After Visit Summary.  MyChart is used to connect with patients for Virtual Visits (Telemedicine).  Patients are able to view lab/test results, encounter notes, upcoming appointments, etc.  Non-urgent messages can be sent to your provider as well.   To learn more about what you can do with MyChart, go to [email or web address].   Other Instructions

## 2024-04-07 NOTE — Progress Notes (Signed)
 OFFICE NOTE  Chief Complaint:  Follow-up, leg edema  Primary Care Physician: Leila Lucie LABOR, MD  HPI:  Alyssa [name] is a 73 y.o. female who is being seen today for the evaluation of dyslipidemia at the request of Eksir, Lucie LABOR, MD.  This is a very pleasant 73 year old female with a significant history of dyslipidemia.  She first noted she had elevated cholesterol in her 30s.  She has a strong family history of elevated cholesterol and heart disease.  Both of her brothers are age 71 and 24 and have elevated cholesterols over 400.  Her mom also had elevated cholesterol and her father had heart disease and died at age 16.  Unfortunately she has been intolerant to statins and is been on numerous statins in the past including Crestor , Lipitor, lovastatin and ezetimibe.  She also has nonalcoholic fatty liver disease.  Most recently her total cholesterol was 343, triglycerides 154, HDL of 40 and LDL of 272.  This is after losing 40 pounds recently although she is still moderately obese on the ketogenic diet.  She is also been taking Crestor  but had significant pain with it and then finally discontinued it.  She has no known coronary disease although did have a pulmonary embolus in 2013.  I personally reviewed her CT scan which does show multivessel coronary artery calcification.  Is a history of breast cancer, diabetes which has resolved with weight loss, and postconcussive syndrome.  She reports that she had had a fairly atherogenic diet however recently again switched ketogenic diet and is cut out a lot of bad saturated fats.  03/13/2019  Alyssa [name] returns today for follow-up.  Recently had repeat lipids which showed marked improvement in numbers.  Total cholesterol is now 168 with triglycerides 132, HDL 52 and LDL of 90.  He is tolerating Praluent  without any side effects.  Overall she is very pleased with her improved cholesterol numbers.  10/17/2019  Alyssa [name] returns today for  follow-up.  Overall she is doing well.  Her repeat lipid recently showed total cholesterol 151, triglycerides 142, HDL 51 and LDL 75.  This is slightly improved and she has subsequently switched from Praluent  to Repatha  due to a change in her primary insurance.  Overall she is pleased with the medication.  12/01/2020  Alyssa [name] continues to have good control of her dyslipidemia.  Total cholesterol 150, HDL 52, triglycerides 133 and LDL 75.  She is tolerating Repatha  well.  Unfortunately, she has had issues with elevated blood pressure.  She reports her primary has tried a number of different medications but they have either not gotten her to target or have been associated with side effects.  More recently she was switched off of the diuretic and onto amlodipine .  Initially this was 5 mg but then increased to 7.5 mg daily.  Since then she has had more lower extremity edema.  Blood pressure remains uncontrolled now 148/82 but improved as it was previously between 180 and 200 systolic.  She is asked for my assistance in managing her blood pressure.  06/23/2021  Alyssa [name] returns today for follow-up.  Unfortunately she became intolerant to the chlorthalidone  was having a lot of side effects.  He may have been related to some hypokalemia.  She stopped her chlorthalidone  and her symptoms improved.  Her blood pressure has been maintained on amlodipine .  She had lab work this summer in August through her PCP which showed a direct LDL 184, total cholesterol 250 and  triglycerides 209.  This is much higher than it previously had been in March with a total cholesterol 150 and LDL 75.  She reports compliance with the medication so is difficult to understand why the cholesterol was so much higher.  I think this has to be reassessed  01/04/2022  Alyssa [name] is seen today in follow-up.  Fortunately her lipids have responded again to Repatha .  Total cholesterol now 114, glycerides 104, HDL 54 and LDL 41.  She reports that  her blood pressure still is uncontrolled.  She had been started on amlodipine  5 mg daily.  Blood pressures at home range between 140-160 systolic over 80.  She needs additional blood pressure lowering.  She has had some medicine intolerances which make this difficult including rash with an ARB.  Heart rate was noted to be low today however shows a sinus bradycardia at 57.  She says that she gets occasional palpitations recently and noted her heart rate has been jumping up for no reason.  She does wear an Apple Watch and has done EKGs of this.  She did not pay much attention to it but did note that the EKG interpretations on the Apple Watch suggested atrial fibrillation.  I reviewed these today and indeed she is having multiple episodes of atrial fibrillation.  This is a new diagnosis.  Her CHA2DS2-VASc score is at least 3 for age, female sex and hypertension.  07/13/2022  Alyssa [name] is seen today in follow-up.  Her blood pressure was surprisingly elevated today 164/110.  EKG shows a new onset atrial flutter with variable ventricular response.  She noted her heart rate was elevated but was not aware that she was out of rhythm.  She has felt more fatigued recently.  She denies any chest pain.  Fortunately she is already anticoagulated on Eliquis  and reports compliance with the medication.  She has been very compliant with her Repatha  and has had marked improvement in her lipids.  Total cholesterol 114, HDL 54, triglycerides 104 and LDL 41.  11/06/2022  Alyssa [name] returns today for follow-up.  She had recent repeat lipids which look pretty good however are increased compared to last June.  Total cholesterol now 155, triglycerides 146, HDL 56 and LDL 76.  EKG today shows sinus rhythm at 67.  She tells me that she has been having some recent increase in shortness of breath and fatigue.  She did have a successful cardioversion at the end of last year but then had recurrent atrial fibrillation and flutter with  multiple occurrences that were picked up on her Apple Watch.  She was advised to take extra metoprolol  as needed for these things however she says her symptoms have been worsening.  It was noted that she does have early onset significant heart disease in multiple family members and was previously noted to have multivessel coronary calcification on CT.  I am concerned about the possibility that her shortness of breath is an anginal equivalent.  04/27/2023  Alyssa [name] is seen today for follow-up.  She reports ongoing shortness of breath.  I did send her for CT coronary angiography to rule out an anginal equivalent.  This showed minimal nonobstructive coronary disease calcium  score 56, 64th percentile.  Subsequently she was seen and an echocardiogram was ordered.  This showed mild to moderate MR but normal LV function and normal diastolic function with normal pulmonary pressures.  She reports significant shortness of breath which seems somewhat out of proportion to additional findings.  She  has seen pulmonary as well without a clear answer to her shortness of breath which is exertional.  She did bring in blood pressure readings today which across-the-board seem to be still elevated between 135-155 systolic.  04/07/2024  Alyssa [name] returns today for follow-up.  Recently she has been having some worsening leg edema and noted that her blood pressure has been elevated.  Her last echo in 2024 showed LVEF 60 to 65% with mild to moderate mitral regurgitation.  She also reports an increase in the burden of her atrial fibrillation according to her Apple Watch and she is symptomatic with this.  She notes it mostly at night.  She says her watch indicates she is up to a 13% burden of A-fib.  EKG today however shows sinus rhythm.  She is on Eliquis .  She is currently only on low-dose Toprol -XL 12.5 mg daily.  PMHx:  Past Medical History:  Diagnosis Date   Abdominal pain    Anxiety    Arthritis    Asthma    Breast  cancer (HCC) 06/2013   left upper inner   Cancer (HCC)    breast   Diabetes mellitus without complication (HCC)    Takes Tradjenta    Family history of adverse reaction to anesthesia    Patients mother had to be resusciated after having anesthesia   GERD (gastroesophageal reflux disease)    Headache(784.0)    Takes Depakote    Hernia, incisional periumbilical, incarcerated 08/07/2011   Hot flashes    Hx of radiation therapy 09/09/13- 10/13/13   left breast 5000 cGy in 25 sessions, no boost   Hyperlipidemia    Hypertension    PCP DR Bettie   Hypothyroidism    Non-alcoholic fatty liver disease    Peripheral vascular disease (HCC)    pulmonary embolis-still have some   Post concussion syndrome    4 yrs ago- sees [facility]- Dr Rosemarie every 6 months-   has sleep study 2011 following accident but was neg per patient-  short term memory issues, dates and times   Pulmonary embolism (HCC) 2013   Recurrent upper respiratory infection (URI)    FLU 08/23/11-08/29/11 then URI 08/29/11- 09/08/11- S/P zpack and steroids-  improved      no cough or congestion   Thyroid  disease    Thyroid  mass, left inferior lobe, 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  Dr. Gladis   BREAST BIOPSY     BREAST LUMPECTOMY Left 2015   radiation   BREAST LUMPECTOMY WITH NEEDLE LOCALIZATION AND AXILLARY SENTINEL LYMPH NODE BX Left 08/04/2013   Procedure: BREAST LUMPECTOMY WITH NEEDLE LOCALIZATION AND AXILLARY SENTINEL LYMPH NODE Biopsy x 3;  Surgeon: Donnice Bury, MD;  Location: [facility] OR;  Service: General;  Laterality: Left;   BREAST SURGERY     CARDIOVERSION N/A 07/26/2022   Procedure:  CARDIOVERSION;  Surgeon: Alvan Ronal BRAVO, MD;  Location: [facility] ENDOSCOPY;  Service: Cardiovascular;  Laterality: N/A;   CARPAL TUNNEL RELEASE  10/2009-12/2010   left - right   CESAREAN SECTION  X 2   COLONOSCOPY     DIAGNOSTIC LAPAROSCOPY     Lap. chole., appendectomy, hernia repair   HERNIA  REPAIR  09/15/11   Ventral w/mesh   JOINT REPLACEMENT     KNEE ARTHROPLASTY Right 12/22/2015   Procedure: COMPUTER ASSISTED TOTAL KNEE ARTHROPLASTY;  Surgeon: Oneil JAYSON Herald, MD;  Location: [facility] OR;  Service: Orthopedics;  Laterality: Right;   LAPAROSCOPIC CHOLECYSTECTOMY  2006   Dr. Adel BRAS TUNNEL RELEASE Bilateral    THYROIDECTOMY, PARTIAL     VENTRAL HERNIA REPAIR  09/29/2011   Procedure: LAPAROSCOPIC VENTRAL HERNIA;  Surgeon: Elspeth KYM Schultze, MD;  Location: [facility] ORS;  Service: General;  Laterality: N/A;  Laparoscopic Ventral Wall Hernia Repair with Mesh    FAMHx:  Family History  Problem Relation Age of Onset   Malignant hyperthermia Mother    Heart attack Father    Stroke Father    Diabetes Other        Grandmother   Other Other        respiratory- Grandmother   Cancer Maternal Grandmother 70       breast   Breast cancer Maternal Grandmother     SOCHx:   reports that she has never smoked. She has never used smokeless tobacco. She reports that she does not drink alcohol and does not use drugs.  ALLERGIES:  Allergies  Allergen Reactions   Aleve [Naproxen Sodium] Hives, Itching and Swelling   Aromasin  [Exemestane ] Anaphylaxis, Itching and Swelling    Pt currently tolerating medication well (02/03/16)-gwd; Pt stated I can take this medication for a few months at a time and I take a benadryl  when I feel it starting to come on Patient has facial swelling, itching, and throat swelling  07/12/17  Taking this right now and no problem. sy/rn   Letrozole  Shortness Of Breath, Rash and Other (See Comments)    Swelling - feet, eyes,hands;   Speech garbled   Levofloxacin Rash and Swelling    Facial  swelling and red facial rash   Penicillins Hives, Swelling and Rash    All over the body. Has patient had a PCN reaction causing immediate rash, facial/tongue/throat swelling, SOB or lightheadedness with hypotension: Yes Has patient had a PCN reaction causing severe rash involving mucus membranes or skin necrosis: No Has patient had a PCN reaction that required hospitalization No Has patient had a PCN reaction occurring within the last 10 years: No If all of the above answers are NO, then may proceed with Cephalosporin use.    Symbicort [Budesonide -Formoterol Fumarate] Anaphylaxis, Hives, Swelling and Rash   Codeine Other (See Comments)    hallucinations   Percocet [Oxycodone -Acetaminophen ] Other (See Comments)    Causes syncope day after medication has been taken.   Vicodin [Hydrocodone -Acetaminophen ] Other (See Comments)    Causes syncope day after medication has been taken.   Amitriptyline  Itching and Cough    Itching, face redness, cough   Doxycycline Swelling    Must with  antihistamine   Ezetimibe Other (See Comments)    Myalgias,fatigue   Prednisone Other (See Comments)    Can take with antihistamine/ can take the shot without antihistamine   Ubrelvy  [Ubrogepant ] Hives   Zonisamide  Itching   Cefdinir Rash and Swelling   Losartan Rash    ROS: Pertinent items noted in HPI and remainder of comprehensive ROS otherwise negative.  HOME MEDS: Current Outpatient Medications on File Prior to Visit  Medication Sig Dispense Refill   amLODipine  (NORVASC ) 5 MG tablet TAKE ONE TABLET BY MOUTH IN THE MORNING AND ONE TABLET EVERY EVENING 180 tablet 3   amphetamine -dextroamphetamine  (ADDERALL) 20 MG  tablet Take 20 mg by mouth 2 (two) times daily. Take 20mg  in the morning and 20mg  in the afternoon.     cetirizine  (ZYRTEC ) 10 MG tablet Take 10 mg by mouth daily.     cholecalciferol (VITAMIN D3) 25 MCG (1000 UNIT) tablet Take 1,000 Units by mouth daily.     ELIQUIS  5 MG TABS tablet TAKE  1 TABLET(5 MG) BY MOUTH TWICE DAILY 60 tablet 11   Evolocumab  (REPATHA  SURECLICK) 140 MG/ML SOAJ INJECT 140MG (1 PEN) INTO THE SKIN EVERY 14 DAYS 6 mL 3   Galcanezumab -gnlm (EMGALITY ) 120 MG/ML SOAJ Inject 120 mg into the skin every 30 (thirty) days. To start 30 days after initial loading dose 1.12 mL 11   Levomilnacipran HCl ER (FETZIMA) 80 MG CP24 Take 80 mg by mouth daily.     levothyroxine  (SYNTHROID ) 100 MCG tablet Take 1 tablet (100 mcg total) by mouth daily before breakfast.     nystatin-triamcinolone (MYCOLOG II) cream Apply 1 Application topically 4 (four) times daily.     omeprazole  (PRILOSEC) 40 MG capsule TAKE 1 CAPSULE BY MOUTH DAILY (Patient taking differently: Take 40 mg by mouth in the morning and at bedtime.) 30 capsule 0   oxybutynin  (DITROPAN -XL) 10 MG 24 hr tablet Take 20 mg by mouth at bedtime.     Semaglutide, 2 MG/DOSE, (OZEMPIC, 2 MG/DOSE,) 8 MG/3ML SOPN Inject 2 mg into the skin every Sunday.     tiZANidine (ZANAFLEX) 4 MG tablet Take 4-12 mg by mouth at bedtime.     vitamin E 180 MG (400 UNITS) capsule Take 400 Units by mouth daily.     albuterol  (VENTOLIN  HFA) 108 (90 Base) MCG/ACT inhaler Inhale 2 puffs into the lungs every 6 (six) hours as needed for wheezing or shortness of breath.  (Patient not taking: Reported on 04/07/2024)     Rimegepant Sulfate (NURTEC) 75 MG TBDP Take 1 tablet (75 mg total) by mouth as needed (take 1 tablet at onset of migraine (do not exceed more than 1 tablet in 24 hrs)). (Patient not taking: Reported on 04/07/2024) 16 tablet 5   No current facility-administered medications on file prior to visit.    LABS/IMAGING: No results found for this or any previous visit (from the past 48 hours). No results found.  LIPID PANEL:    Component Value Date/Time   CHOL 114 12/22/2021 1015   TRIG 104 12/22/2021 1015   HDL 54 12/22/2021 1015   CHOLHDL 2.1 12/22/2021 1015   LDLCALC 41 12/22/2021 1015    WEIGHTS: Wt Readings from Last 3 Encounters:   04/07/24 204 lb 11.2 oz (92.9 kg)  03/05/24 202 lb 1.6 oz (91.7 kg)  02/26/24 200 lb (90.7 kg)    VITALS: BP (!) 160/92 (BP Location: Left Arm, Patient Position: Sitting, Cuff Size: Normal)   Pulse 74   Ht 5' 2 (1.575 m)   Wt 204 lb 11.2 oz (92.9 kg)   SpO2 96%   BMI 37.44 kg/m   EXAM: General appearance: alert, no distress, and moderately obese Lungs: clear to auscultation bilaterally Heart: regular rate and rhythm Extremities: edema trace sock line Neurologic: Grossly normal Psych: Pleasant  EKG: EKG Interpretation Date/Time:  Monday April 07 2024 13:03:54 EDT Ventricular Rate:  74 PR Interval:  128 QRS Duration:  86 QT Interval:  386 QTC Calculation: 428 R Axis:   8  Text Interpretation: Normal sinus rhythm Normal ECG When compared with ECG of 27-Apr-2023 13:46, T wave amplitude has decreased in Inferior leads Confirmed by  Mona Kent ([id number]) on 04/07/2024 1:17:56 PM    ASSESSMENT: Leg edema CAC score of 56, 64th percentile, minimal to mild nonobstructive coronary disease (10/2022) Mild to moderate mitral regurgitation, normal LVEF 60 to 65% by echo (12/2022) History of atrial flutter s/p DCCV (2023) Paroxysmal atrial fibrillation-CHA2DS2-VASc score of 3 Probable HeFH-Dutch Score of 6 Family history of premature coronary disease Statin intolerance Multivessel coronary calcification Uncontrolled hypertension  PLAN: 1.   Mrs. [name] says that she has been having worsening leg edema.  She is only wearing ankle high compression socks.  I have encouraged her to get knee-high bilateral 20 to 30 mmHg compression stockings.  Will also increase her Aldactone  to 50 mg daily.  She will need repeat of her metabolic profile in 1 to 2 weeks.  Will also repeat lipids at that time as she has been on the Repatha .  Blood pressure is elevated as well.  Hopefully this will help with that.  Since she is having more A-fib I advised her to increase her metoprolol  to 25 mg daily.   If this is not better controlled then she may need referral to EP for evaluation of antiarrhythmic therapy.  Plan follow-up with us  in 1 to 2 months  Kent KYM Mona, MD, [facility], FNLA, FACP  [place]  [facility]  Medical Director of the [facility] Lipid [facility] &  Cardiovascular Risk Reduction [facility] Diplomate of the American Board of Clinical Lipidology Attending Cardiologist  Direct Dial: [phone]  Fax: [phone]  Website:  [email or web address] Kent BROCKS Benedetto Ryder 04/07/2024, 1:48 PM

## 2024-04-10 ENCOUNTER — Telehealth: Payer: Self-pay

## 2024-04-10 NOTE — Telephone Encounter (Signed)
   Pre-operative Risk Assessment    Patient Name: Alyssa [name]  DOB: 1950/09/15 MRN: [id number]   Date of last office visit: 04/07/24 Date of next office visit: 05/08/24   Request for Surgical Clearance    Procedure:  Colonoscopy   Date of Surgery:  Clearance 05/02/24                                 Surgeon:  Dr. Kristie Socks Group or Practice Name:  [facility], [place]  Phone number:  [phone] Fax number:  [phone]   Type of Clearance Requested:   - Medical  - Pharmacy:  Hold Apixaban  (Eliquis ) Not indicated    Type of Anesthesia:  Propofol     Additional requests/questions:    Bonney Rebeca Blight   04/10/2024, 9:28 AM

## 2024-04-11 NOTE — Telephone Encounter (Signed)
   Patient Name: Alyssa [name]  DOB: Jan 24, 1951 MRN: [id number]  Primary Cardiologist: Vinie JAYSON Maxcy, MD  Chart reviewed as part of pre-operative protocol coverage. Pre-op clearance already addressed by colleagues in earlier phone notes. To summarize recommendations:  -Colonscopy is low risk - she can hold her Eliquis  2 days prior to procedure and restart after.  -Dr. Maxcy  Will route this bundled recommendation to requesting provider via Epic fax function and remove from pre-op pool. Please call with questions.  Orren LOISE Fabry, PA-C 04/11/2024, 10:28 AM

## 2024-04-11 NOTE — Telephone Encounter (Signed)
 Patient with diagnosis of PAF on Eliquis  for anticoagulation.    Procedure:   Colonoscopy  Date of procedure: 05/02/2024    CHA2DS2-VASc Score = 5   This indicates a 7.2% annual risk of stroke. The patient's score is based upon: CHF History: 0 HTN History: 1 Diabetes History: 1 Stroke History: 0 Vascular Disease History: 1 Age Score: 1 Gender Score: 1     CrCl 59 mL/min Last Platelet - 284 (10/2022) no recent CBC Platelet count - does not affect DOAC hold decision can get updated lab at surgeon's discretion   Patient has not  had an Afib/aflutter ablation or Watchman within the last 3 months or DCCV within the last 30 days     Per office protocol, patient can hold Eliquis  for 2 days prior to procedure.   Patient will not need bridging with Lovenox (enoxaparin) around procedure.  **This guidance is not considered finalized until pre-operative APP has relayed final recommendations.**

## 2024-04-18 IMAGING — MG MM DIGITAL SCREENING BILAT W/ TOMO AND CAD
8 series · 8 of 24 positions shown · non-contrast
Comparison: Previous exam(s).

CLINICAL DATA: Screening.

EXAM:
DIGITAL SCREENING BILATERAL MAMMOGRAM WITH TOMOSYNTHESIS AND CAD
TECHNIQUE: Bilateral screening digital craniocaudal and mediolateral oblique
mammograms were obtained. Bilateral screening digital breast
tomosynthesis was performed. The images were evaluated with
computer-aided detection.

[R CC synth-2D]
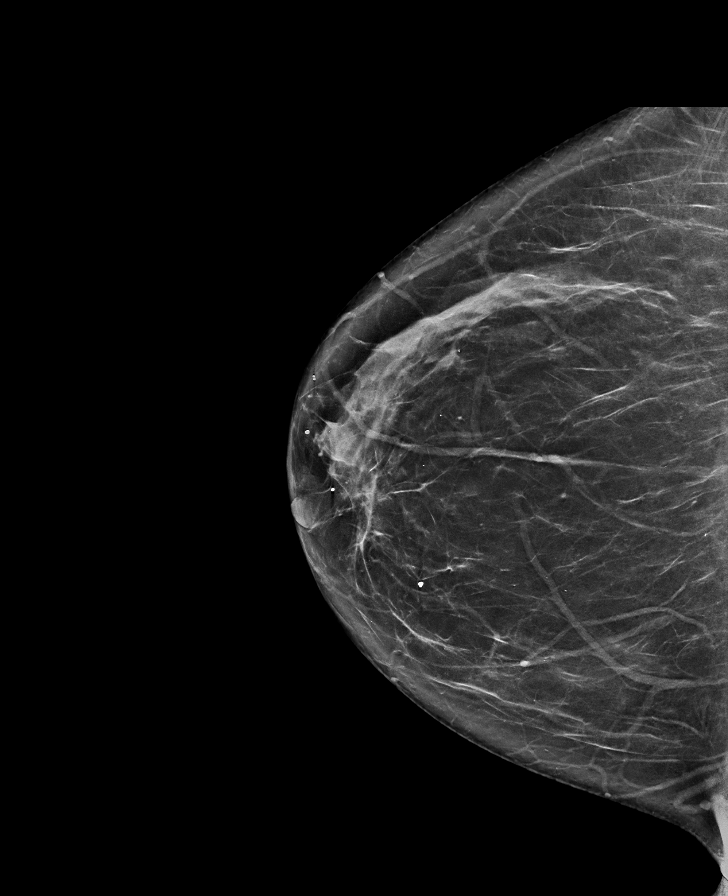

[L CC synth-2D]
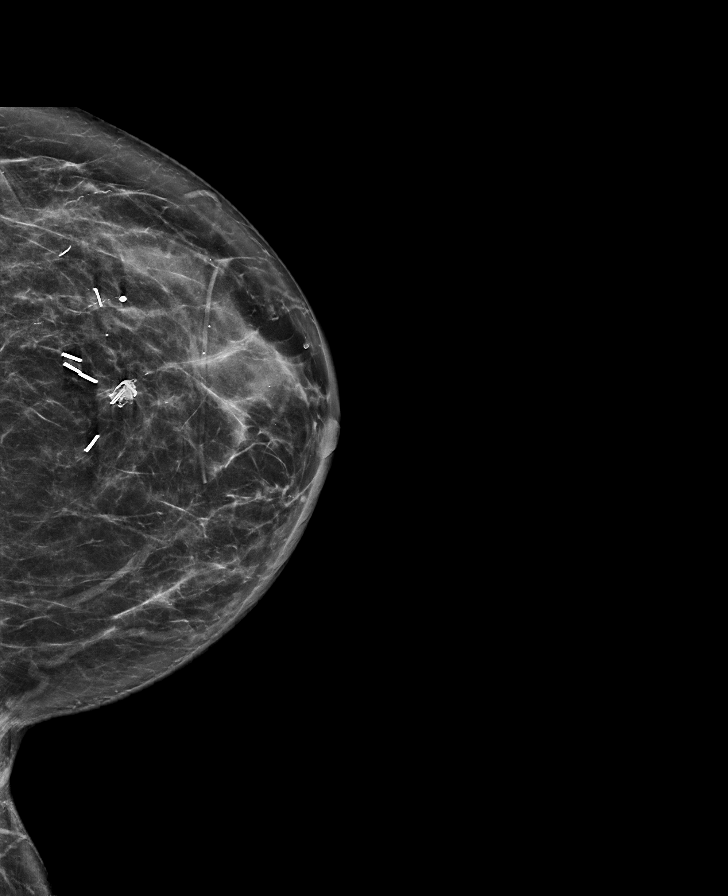

[R MLO synth-2D]
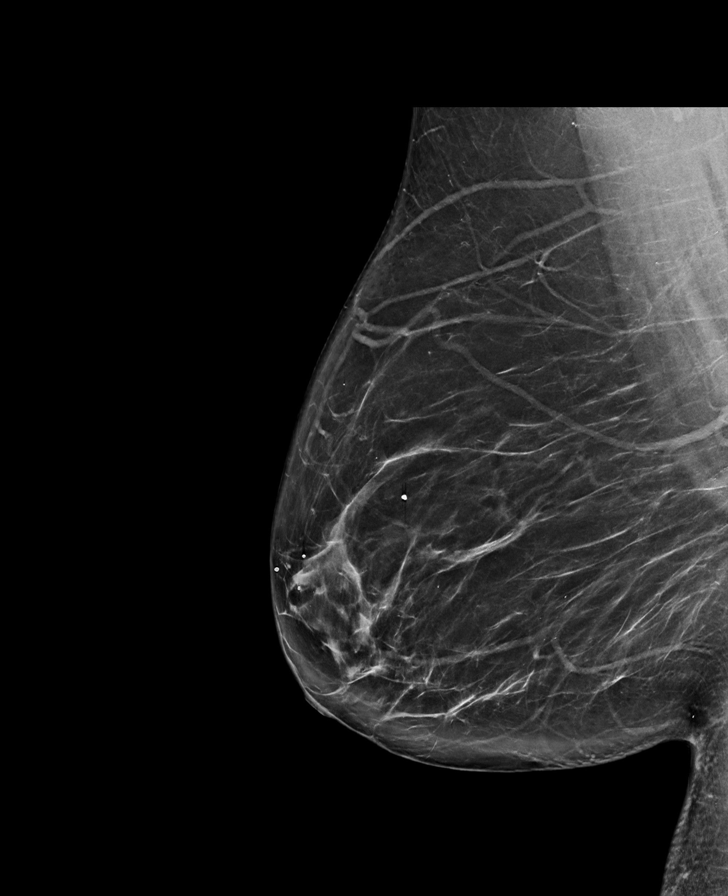

[L MLO synth-2D]
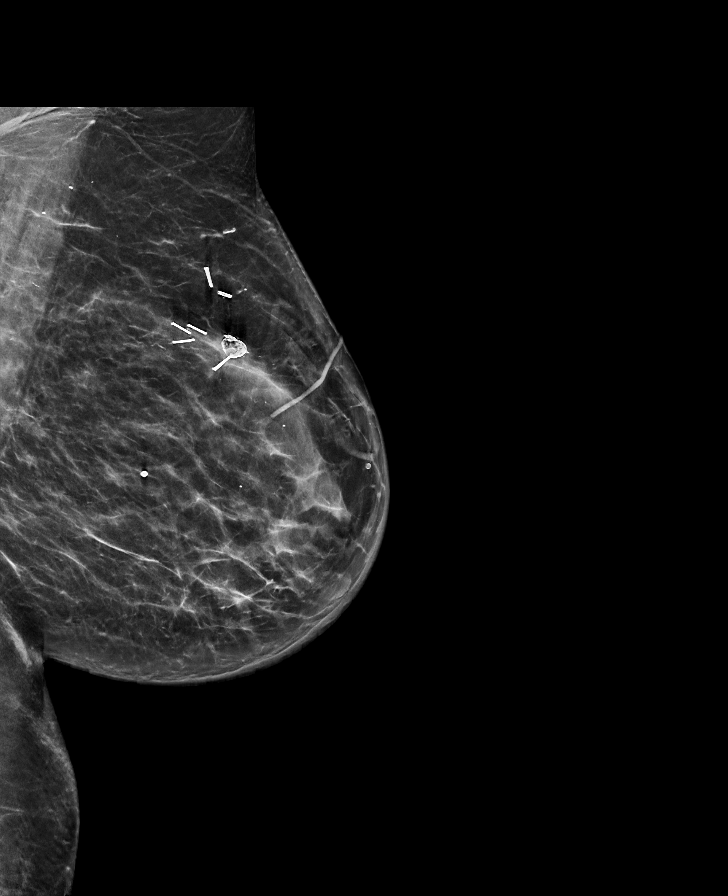

[L MLO tomo · tomo slice 40/79.0]
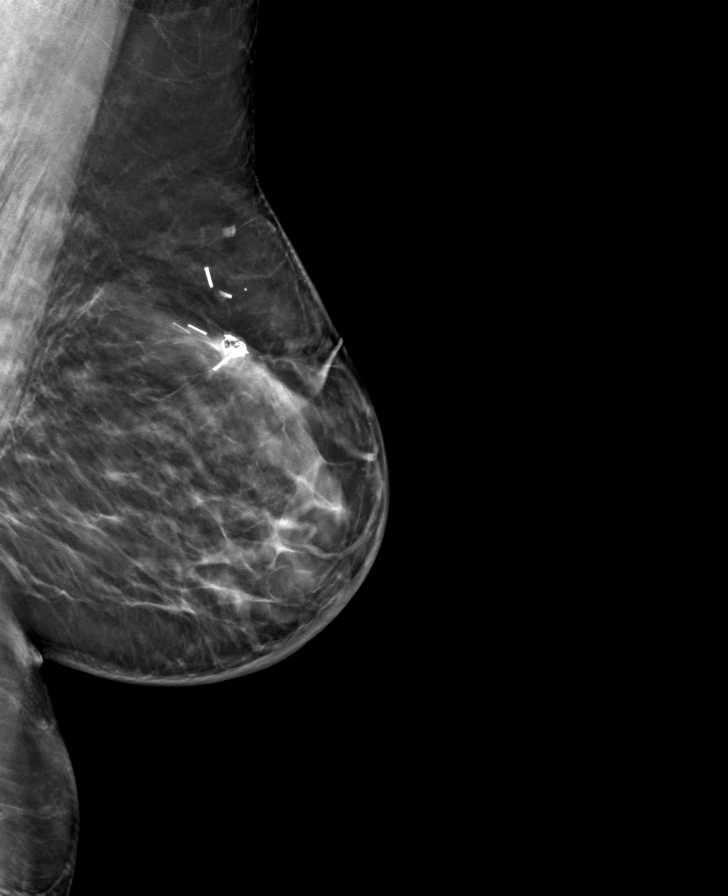

[R MLO tomo · tomo slice 42/83.0]
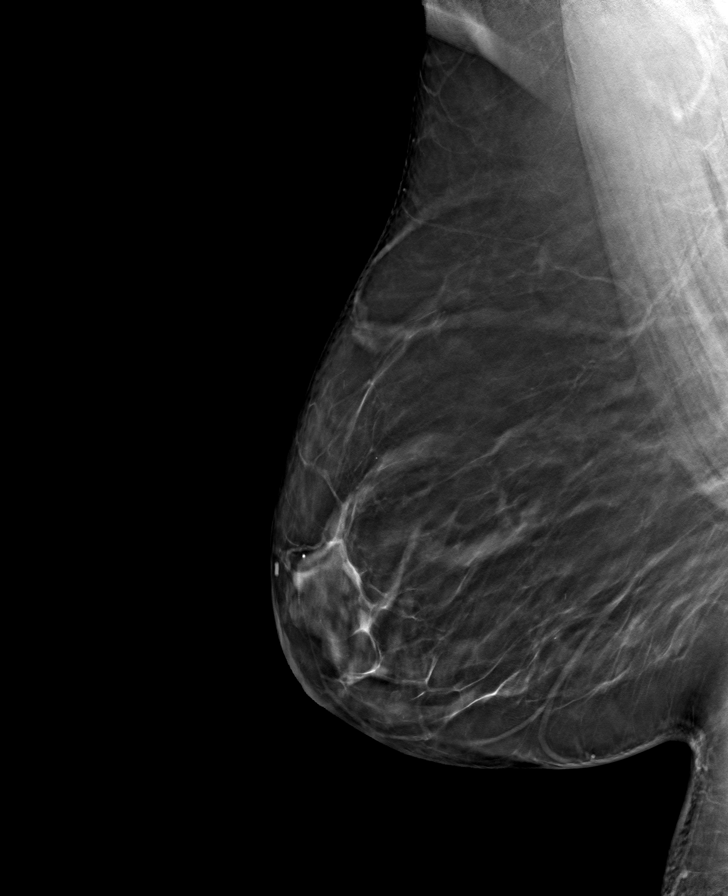

[L CC tomo · tomo slice 37/72.0]
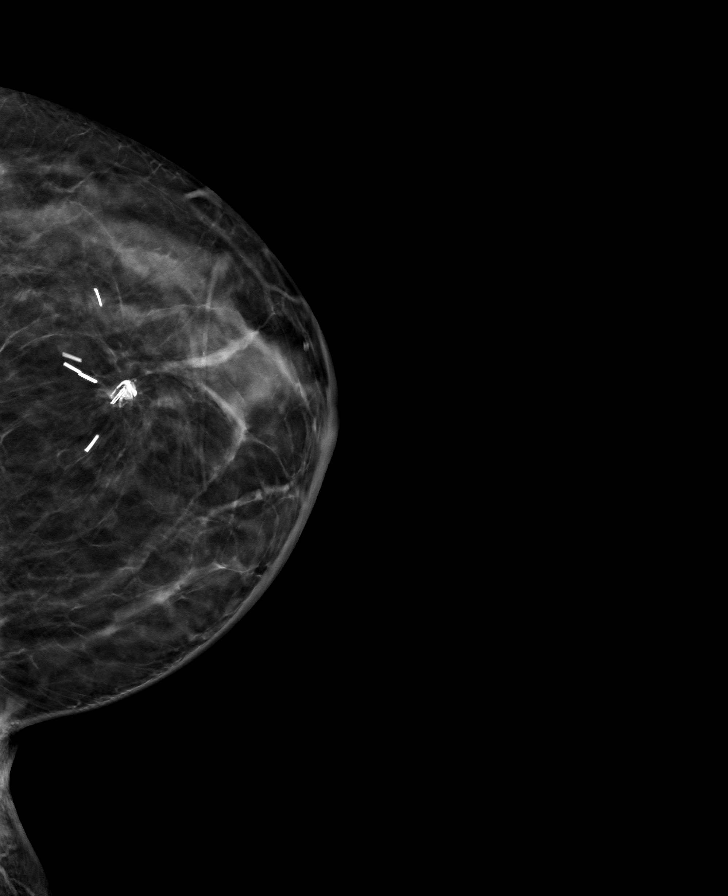

[R CC tomo · tomo slice 37/73.0]
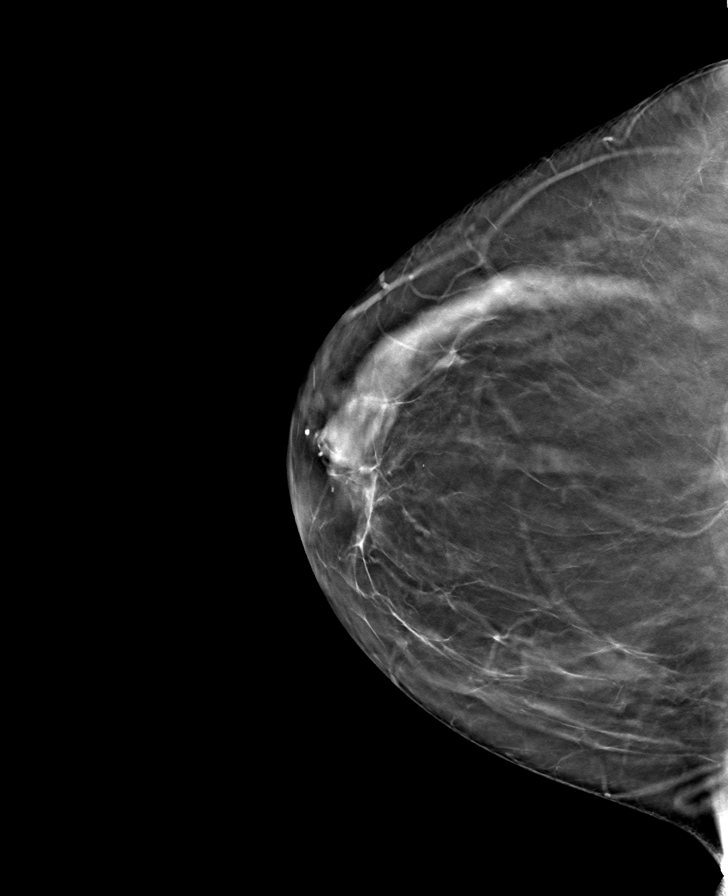

[8 of 24 positions shown; findings below may reference images not displayed]

ACR Breast Density Category b: There are scattered areas of
fibroglandular density.
FINDINGS: There are no findings suspicious for malignancy.
IMPRESSION: No mammographic evidence of malignancy. A result letter of this
screening mammogram will be mailed directly to the patient.

RECOMMENDATION:
Screening mammogram in one year. (Code:51-O-LD2)

BI-RADS CATEGORY  1: Negative.

## 2024-04-22 ENCOUNTER — Encounter: Payer: Self-pay | Admitting: Internal Medicine

## 2024-04-22 LAB — LIPID PANEL
Chol/HDL Ratio: 4 ratio (ref 0.0–4.4)
Cholesterol, Total: 231 mg/dL — ABNORMAL HIGH (ref 100–199)
HDL: 58 mg/dL (ref >39)
LDL Chol Calc (NIH): 140 mg/dL — ABNORMAL HIGH (ref 0–99)
Triglycerides: 185 mg/dL — ABNORMAL HIGH (ref 0–149)
VLDL Cholesterol Cal: 33 mg/dL (ref 5–40)

## 2024-04-22 LAB — BASIC METABOLIC PANEL WITH GFR
BUN/Creatinine Ratio: 25 (ref 12–28)
BUN: 25 mg/dL (ref 8–27)
CO2: 20 mmol/L (ref 20–29)
Calcium: 9.8 mg/dL (ref 8.7–10.3)
Chloride: 102 mmol/L (ref 96–106)
Creatinine, Ser: 1 mg/dL (ref 0.57–1.00)
Glucose: 97 mg/dL (ref 70–99)
Potassium: 4.9 mmol/L (ref 3.5–5.2)
Sodium: 141 mmol/L (ref 134–144)
eGFR: 60 mL/min/1.73 (ref >59)

## 2024-04-24 ENCOUNTER — Ambulatory Visit: Admitting: Physician Assistant

## 2024-04-24 ENCOUNTER — Other Ambulatory Visit (INDEPENDENT_AMBULATORY_CARE_PROVIDER_SITE_OTHER): Payer: Self-pay

## 2024-04-24 DIAGNOSIS — M545 Low back pain, unspecified: Secondary | ICD-10-CM

## 2024-04-24 DIAGNOSIS — M7062 Trochanteric bursitis, left hip: Secondary | ICD-10-CM

## 2024-04-24 MED ORDER — METHYLPREDNISOLONE ACETATE 40 MG/ML IJ SUSP
40.0000 mg | INTRAMUSCULAR | Status: AC | PRN
  Administered 2024-04-24: 40 mg via INTRA_ARTICULAR

## 2024-04-24 MED ORDER — BUPIVACAINE HCL 0.25 % IJ SOLN
2.0000 mL | INTRAMUSCULAR | Status: AC | PRN
  Administered 2024-04-24: 2 mL via INTRA_ARTICULAR

## 2024-04-24 MED ORDER — LIDOCAINE HCL 1 % IJ SOLN
3.0000 mL | INTRAMUSCULAR | Status: AC | PRN
  Administered 2024-04-24: 3 mL

## 2024-04-24 NOTE — Progress Notes (Signed)
 Office Visit Note   Patient: Alyssa [name]           Date of Birth: Jan 17, 1951           MRN: [id number] Visit Date: 04/24/2024              Requested by: Leila Lucie LABOR, MD [id number] [place],  [place] [address] PCP: Leila Lucie LABOR, MD   Assessment & Plan: Visit Diagnoses:  1. Trochanteric bursitis, left hip   2. Low back pain, unspecified back pain laterality, unspecified chronicity, unspecified whether sciatica present     Plan: Impression is left hip traumatic trochanteric bursitis.  We have discussed proceeding with trochanteric bursa cortisone injection for which she is agreeable to.  We also discussed that occasionally it takes 2 injections to alleviate the symptoms.  She will follow-up with us  as needed.  Call with concerns or questions.  Follow-Up Instructions: Return if symptoms worsen or fail to improve.   Orders:  Orders Placed This Encounter  Procedures   Large Joint Inj: L greater trochanter   XR HIP UNILAT W OR W/O PELVIS 2-3 VIEWS LEFT   XR Lumbar Spine 2-3 Views   No orders of the defined types were placed in this encounter.     Procedures: Large Joint Inj: L greater trochanter on 04/24/2024 3:06 PM Indications: pain Details: 22 G needle, lateral approach Medications: 3 mL lidocaine  1 %; 2 mL bupivacaine  0.25 %; 40 mg methylPREDNISolone  acetate 40 MG/ML      Clinical Data: No additional findings.   Subjective: Chief Complaint  Patient presents with   Left Hip - Pain   Lower Back - Pain    HPI patient is a very pleasant 73 year old female who comes in today with left lateral hip pain.  Symptoms began about 10-day days ago after falling on her left side in her bathroom.  All of her pain is still lateral aspect.  No pain to the groin or anterior thigh.  Symptoms appear to be worse when she is going from a seated to standing position.  She has been using Biofreeze and taking Tylenol  without relief.  She has tried her TENS unit without  relief.  Review of Systems as detailed in HPI.  All others she and are negative.   Objective: Vital Signs: There were no vitals taken for this visit.  Physical Exam well-developed well-nourished female in no acute distress.  Alert and oriented x 3.  Ortho Exam left hip exam: Marked tenderness at the greater trochanter.  No pain with logroll or FADIR testing.  She is neurovascularly intact distally.  Specialty Comments:  No specialty comments available.  Imaging: No results found.   PMFS History: Patient Active Problem List   Diagnosis Date Noted   Mild intermittent asthma 03/05/2024   Dyspnea on exertion 04/25/2022   Nondisplaced fracture of fifth metatarsal bone, right foot, initial encounter for closed fracture 01/30/2022   Pain in left shoulder 01/02/2022   Spondylosis of cervical region without myelopathy or radiculopathy 01/16/2020   Other spondylosis with radiculopathy, cervical region 06/13/2018   GERD (gastroesophageal reflux disease) 04/05/2018   Allergic rhinitis 04/05/2018   Upper airway cough syndrome 04/05/2018   Patellar tendinitis, right knee 03/01/2017   Status post total right knee replacement 12/22/2015   Hepatic steatosis 02/09/2015   Hot flashes 09/09/2014   Osteopenia 09/09/2014   Malignant neoplasm of upper-inner quadrant of left breast in female, estrogen receptor positive (HCC) 07/23/2013   Memory  loss 07/14/2013   Headache 07/14/2013   Other malaise and fatigue 07/14/2013   Acute pulmonary embolism (HCC) 10/07/2011   Leukocytosis 10/07/2011   Thyroid  mass, left inferior lobe, 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 Mar2013 10/07/2011   Hyponatremia 10/07/2011   Obesity (BMI 30-39.9) 08/07/2011   Post concussion syndrome    Past Medical History:  Diagnosis Date   Abdominal pain    Anxiety    Arthritis    Asthma    Breast cancer (HCC) 06/2013   left upper inner   Cancer (HCC)    breast   Diabetes mellitus without complication (HCC)    Takes Tradjenta    Family history  of adverse reaction to anesthesia    Patients mother had to be resusciated after having anesthesia   GERD (gastroesophageal reflux disease)    Headache(784.0)    Takes Depakote    Hernia, incisional periumbilical, incarcerated 08/07/2011   Hot flashes    Hx of radiation therapy 09/09/13- 10/13/13   left breast 5000 cGy in 25 sessions, no boost   Hyperlipidemia    Hypertension    PCP DR Bettie   Hypothyroidism    Non-alcoholic fatty liver disease    Peripheral vascular disease    pulmonary embolis-still have some   Post concussion syndrome    4 yrs ago- sees [facility]- Dr Rosemarie every 6 months-   has sleep study 2011 following accident but was neg per patient-  short term memory issues, dates and times   Pulmonary embolism (HCC) 2013   Recurrent upper respiratory infection (URI)    FLU 08/23/11-08/29/11 then URI 08/29/11- 09/08/11- S/P zpack and steroids-  improved      no cough or congestion   Thyroid  disease    Thyroid  mass, left inferior lobe, 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 [id number] 10/07/2011   Ventral hernia     Family History  Problem Relation Age of Onset   Malignant hyperthermia Mother    Heart attack Father    Stroke Father    Diabetes Other        Grandmother   Other Other        respiratory- Grandmother   Cancer Maternal Grandmother 77       breast   Breast cancer Maternal Grandmother     Past Surgical History:  Procedure Laterality Date   ANTERIOR CERVICAL DECOMP/DISCECTOMY FUSION N/A 01/16/2020   Procedure: c5-6, c6-7 ANTERIOR CERVICAL DECOMPRESSION/DISCECTOMY FUSION, ALLOGRAFT AND PLATE;  Surgeon: Barbarann Oneil BROCKS, MD;  Location: [facility] OR;  Service: Orthopedics;  Laterality: N/A;   APPENDECTOMY  2002   lap appy.  Dr. Gladis   BREAST BIOPSY     BREAST LUMPECTOMY Left 2015   radiation   BREAST LUMPECTOMY WITH NEEDLE LOCALIZATION AND AXILLARY SENTINEL LYMPH NODE BX Left 08/04/2013   Procedure: BREAST LUMPECTOMY WITH NEEDLE LOCALIZATION AND AXILLARY SENTINEL LYMPH NODE Biopsy x 3;  Surgeon:  Donnice Bury, MD;  Location: [facility] OR;  Service: General;  Laterality: Left;   BREAST SURGERY     CARDIOVERSION N/A 07/26/2022   Procedure: CARDIOVERSION;  Surgeon: Alvan Ronal BRAVO, MD;  Location: [facility] ENDOSCOPY;  Service: Cardiovascular;  Laterality: N/A;   CARPAL TUNNEL RELEASE  10/2009-12/2010   left - right   CESAREAN SECTION  X 2   COLONOSCOPY     DIAGNOSTIC LAPAROSCOPY     Lap. chole., appendectomy, hernia repair   HERNIA REPAIR  09/15/11   Ventral w/mesh   JOINT REPLACEMENT     KNEE ARTHROPLASTY Right 12/22/2015   Procedure: COMPUTER ASSISTED TOTAL KNEE ARTHROPLASTY;  Surgeon:  Oneil JAYSON Herald, MD;  Location: [facility] OR;  Service: Orthopedics;  Laterality: Right;   LAPAROSCOPIC CHOLECYSTECTOMY  2006   Dr. Adel BRAS TUNNEL RELEASE Bilateral    THYROIDECTOMY, PARTIAL     VENTRAL HERNIA REPAIR  09/29/2011   Procedure: LAPAROSCOPIC VENTRAL HERNIA;  Surgeon: Elspeth KYM Schultze, MD;  Location: [facility] ORS;  Service: General;  Laterality: N/A;  Laparoscopic Ventral Wall Hernia Repair with Mesh   Social History   Occupational History    Employer: [employer]  Tobacco Use   Smoking status: Never   Smokeless tobacco: Never  Vaping Use   Vaping status: Never Used  Substance and Sexual Activity   Alcohol use: No    Alcohol/week: 1.0 standard drink of alcohol    Types: 1 Glasses of wine per week   Drug use: No   Sexual activity: Yes    Comment: Menarche age 61, first live birth age 29,  GX P2. She stopped having periods approximately 1999 and took HRT until 2005.

## 2024-04-27 ENCOUNTER — Ambulatory Visit: Payer: Self-pay | Admitting: Internal Medicine

## 2024-04-27 DIAGNOSIS — E7849 Other hyperlipidemia: Secondary | ICD-10-CM

## 2024-05-08 ENCOUNTER — Encounter: Payer: Self-pay | Admitting: Emergency Medicine

## 2024-05-08 ENCOUNTER — Ambulatory Visit

## 2024-05-08 ENCOUNTER — Ambulatory Visit: Attending: Cardiology | Admitting: Emergency Medicine

## 2024-05-08 VITALS — BP 126/68 | HR 70 | Ht 62.0 in | Wt 202.0 lb

## 2024-05-08 DIAGNOSIS — R0609 Other forms of dyspnea: Secondary | ICD-10-CM

## 2024-05-08 DIAGNOSIS — I2510 Atherosclerotic heart disease of native coronary artery without angina pectoris: Secondary | ICD-10-CM | POA: Diagnosis not present

## 2024-05-08 DIAGNOSIS — I480 Paroxysmal atrial fibrillation: Secondary | ICD-10-CM

## 2024-05-08 DIAGNOSIS — I10 Essential (primary) hypertension: Secondary | ICD-10-CM

## 2024-05-08 DIAGNOSIS — I340 Nonrheumatic mitral (valve) insufficiency: Secondary | ICD-10-CM

## 2024-05-08 DIAGNOSIS — R600 Localized edema: Secondary | ICD-10-CM

## 2024-05-08 DIAGNOSIS — E785 Hyperlipidemia, unspecified: Secondary | ICD-10-CM

## 2024-05-08 NOTE — Patient Instructions (Signed)
 Medication Instructions:  NO CHANGES   Lab Work: NONE TO BE DONE TODAY.   Testing/Procedures: Your physician has requested that you have an echocardiogram. Echocardiography is a painless test that uses sound waves to create images of your heart. It provides your doctor with information about the size and shape of your heart and how well your heart's chambers and valves are working. This procedure takes approximately one hour. There are no restrictions for this procedure. Please do NOT wear cologne, perfume, aftershave, or lotions (deodorant is allowed). Please arrive 15 minutes prior to your appointment time.  Please note: We ask at that you not bring children with you during ultrasound (echo/ vascular) testing. Due to room size and safety concerns, children are not allowed in the ultrasound rooms during exams. Our front office staff cannot provide observation of children in our lobby area while testing is being conducted. An adult accompanying a patient to their appointment will only be allowed in the ultrasound room at the discretion of the ultrasound technician under special circumstances. We apologize for any inconvenience.   ZIO XT- Long Term Monitor Instructions  Your physician has requested you wear a ZIO patch monitor for 14 days.  This is a single patch monitor. [employer] supplies one patch monitor per enrollment. Additional stickers are not available. Please do not apply patch if you will be having a Nuclear Stress Test,  Echocardiogram, Cardiac CT, MRI, or Chest Xray during the period you would be wearing the  monitor. The patch cannot be worn during these tests. You cannot remove and re-apply the  ZIO XT patch monitor.  Your ZIO patch monitor will be mailed 3 day USPS to your address on file. It may take 3-5 days  to receive your monitor after you have been enrolled.  Once you have received your monitor, please review the enclosed instructions. Your monitor  has already been  registered assigning a specific monitor serial # to you.  Billing and Patient Assistance Program Information  We have supplied [employer] with any of your insurance information on file for billing purposes. [employer] offers a sliding scale Patient Assistance Program for patients that do not have  insurance, or whose insurance does not completely cover the cost of the ZIO monitor.  You must apply for the Patient Assistance Program to qualify for this discounted rate.  To apply, please call [employer] at [phone], select option 4, select option 2, ask to apply for  Patient Assistance Program. Meredeth will ask your household income, and how many people  are in your household. They will quote your out-of-pocket cost based on that information.  [employer] will also be able to set up a 52-month, interest-free payment plan if needed.  Applying the monitor   Shave hair from upper left chest.  Hold abrader disc by orange tab. Rub abrader in 40 strokes over the upper left chest as  indicated in your monitor instructions.  Clean area with 4 enclosed alcohol pads. Let dry.  Apply patch as indicated in monitor instructions. Patch will be placed under collarbone on left  side of chest with arrow pointing upward.  Rub patch adhesive wings for 2 minutes. Remove white label marked 1. Remove the white  label marked 2. Rub patch adhesive wings for 2 additional minutes.  While looking in a mirror, press and release button in center of patch. A small green light will  flash 3-4 times. This will be your only indicator that the monitor has been turned on.  Do  not shower for the first 24 hours. You may shower after the first 24 hours.  Press the button if you feel a symptom. You will hear a small click. Record Date, Time and  Symptom in the Patient Logbook.  When you are ready to remove the patch, follow instructions on the last 2 pages of Patient  Logbook. Stick patch monitor onto the last page of Patient  Logbook.  Place Patient Logbook in the blue and white box. Use locking tab on box and tape box closed  securely. The blue and white box has prepaid postage on it. Please place it in the mailbox as  soon as possible. Your physician should have your test results approximately 7 days after the  monitor has been mailed back to [facility].  Call [facility] Customer Care at [phone] if you have questions regarding  your ZIO XT patch monitor. Call them immediately if you see an orange light blinking on your  monitor.  If your monitor falls off in less than 4 days, contact our Monitor department at [phone].  If your monitor becomes loose or falls off after 4 days call [employer] at [phone] for  suggestions on securing your monitor   Follow-Up: At [facility], you and your health needs are our priority.  As part of our continuing mission to provide you with exceptional heart care, our providers are all part of one team.  This team includes your primary Cardiologist (physician) and Advanced Practice Providers or APPs (Physician Assistants and Nurse Practitioners) who all work together to provide you with the care you need, when you need it.  Your next appointment:   3 MONTHS  Provider:   MADISON FOUNTAIN, NP   We recommend signing up for the patient portal called MyChart.  Sign up information is provided on this After Visit Summary.  MyChart is used to connect with patients for Virtual Visits (Telemedicine).  Patients are able to view lab/test results, encounter notes, upcoming appointments, etc.  Non-urgent messages can be sent to your provider as well.   To learn more about what you can do with MyChart, go to [email or web address].   Other Instructions: A REFERRAL TO EP HAS BEEN PUT IN AND SOMEONE FROM THE OFFICE WILL CONTACT YOU TO GET YOU SCHEDULED WITH THEM.

## 2024-05-08 NOTE — Progress Notes (Signed)
 Cardiology Office Note:    Date:  05/08/2024  ID:  Alyssa [name], DOB [birth year], MRN [id number] PCP: Alyssa Lucie LABOR, MD  [facility] Providers Cardiologist:  Alyssa JAYSON Maxcy, MD Cardiology APP:  Alyssa Lum CROME, NP       Patient Profile:       Chief Complaint: 1 month follow-up for shortness of breath and atrial fibrillation History of Present Illness:  Alyssa [name] is a 73 y.o. female with visit-pertinent history of pulmonary embolism in 2010, breast cancer, atrial fibrillation, hypertension, GERD  PAF/Aflutter. Onset June 2023, started on anticoagulation.  Cardioversion 07/26/2022.  Echo 04/25/2022: EF 55-60%. Mild LAE. Mild MR.  Hypertension.  Coronary artery disease Hyperlipidemia.  Lipid panel 12/22/2021: LDL 41, HDL 54, TG 104, total 114.  LPa 07/13/2022: 158.1.  PE 2010.  Breast CA.  Mitral valve regurgitation  She was first evaluated by Dr. Maxcy on 06/14/2018 for dyslipidemia. At an office visit with Dr. Maxcy on 01/04/2022 patient noted occasional palpitations with unexplained increase in heart rate. Apple Watch suggested afib. Apple Watch EKGs were evaluated and confirmed Afib. Patient was started on anticoagulation. Patient was last seen in the office on 07/13/2022 by Dr. Maxcy. At that time she was noted to be hypertensive and EKG showed new onset aflutter with variable ventricular response. She had no cardiac awareness of dysrhythmia but did admit to increased fatigue. She has been compliant with anticoagulation since June. She underwent successful cardioversion on 07/26/2022.   Echocardiogram 04/25/2022 showed LVEF 55 to 60%, no RWMA, diastolic parameters are normal, RV function normal, left atrial size mildly dilated, mild mitral valve regurgitation, mild aortic stenosis.  Coronary CTA 11/10/2022 with coronary calcium  score of 56 (64th percentile (and total plaque volume of 188 (49th percentile) while 25-49% stenosis in mid RCA and OM1 and minimal 0-24%  stenosis in left main, mid LAD, proximal LCx, mid LCx and proximal RCA.  Echocardiogram 01/19/2023 showed LVEF 60 to 65%, no RWMA, normal diastolic parameters, RV function and size normal, left atrial size mildly dilated, mild to moderate mitral valve regurgitation, aortic valve mean gradient 12 mmHg.  She was last seen in clinic on 04/07/2024 by Dr. Maxcy.  She noted to be having some worsening leg edema and noted her blood pressures to be elevated.  She noted that her watch indicated that she is up to 13% burden of A-fib.  Her spironolactone  was increased to 50 mg daily.  She was also to increase her metoprolol  to 25 mg daily.   Discussed the use of AI scribe software for clinical note transcription with the patient, who gave verbal consent to proceed.  History of Present Illness Alyssa [name] is a 73 year old female with atrial fibrillation and mitral valve regurgitation who presents with worsening leg swelling and increased episodes of atrial fibrillation.  She experiences worsening leg swelling primarily in her legs and ankles, beginning around 3 PM and resolving by morning. Spironolactone  has not improved the swelling. 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  She continues to experience intermittent episodes of shortness of breath.  She denies any episodes of chest pain, orthopnea, or PND.  No weight gain.  Her atrial fibrillation episodes have increased from 2% to 12% over a week, accompanied by palpitations and a significant increase in heart rate. She monitors her condition using her phone and an Scientist, physiological.  She does report her paroxysmal's have become more frequent.  She is  without lightheadedness, dizziness, syncope, presyncope, melena or hematochezia    Review of systems:  Please see the history of present illness. All other systems are reviewed and otherwise negative.      Studies Reviewed:        Echocardiogram 01/19/2023  1.  Left ventricular ejection fraction, by estimation, is 60 to 65%. The  left ventricle has normal function. The left ventricle has no regional  wall motion abnormalities. Left ventricular diastolic parameters were  normal.   2. Right ventricular systolic function is normal. The right ventricular  size is normal. There is normal pulmonary artery systolic pressure. The  estimated right ventricular systolic pressure is 28.0 mmHg.   3. Left atrial size was mildly dilated.   4. The mitral valve is grossly normal. Mild to moderate mitral valve  regurgitation.   5. The aortic valve is tricuspid. There is moderate calcification of the  aortic valve. There is moderate thickening of the aortic valve. Aortic  valve regurgitation is not visualized. Aortic valve mean gradient measures  12.0 mmHg. Aortic valve Vmax  measures 2.24 m/s.   6. The inferior vena cava is normal in size with greater than 50%  respiratory variability, suggesting right atrial pressure of 3 mmHg.   Coronary CTA 11/10/2022 1. Coronary calcium  score of 56. This was 64th percentile for age and sex matched control.   2. Total plaque volume 142mm3 which is 49th percentile for age and sex-matched controls (calcified plaque 26mm3; noncalcified plaque 138mm3). TPV is moderate   3.  Normal coronary origin with right dominance.   4.  Nonobstructive CAD   5.  Mild (25-49%) stenosis in mid RCA and OM1   6. Minimal (0-24%) stenosis in left main, mid LAD, proximal LCX, mid LCX, and proximal RCA   CAD-RADS 2. Mild non-obstructive CAD (25-49%). Consider non-atherosclerotic causes of chest pain. Consider preventive therapy and risk factor modification. Risk Assessment/Calculations:    CHA2DS2-VASc Score = 5   This indicates a 7.2% annual risk of stroke. The patient's score is based upon: CHF History: 0 HTN History: 1 Diabetes History: 1 Stroke History: 0 Vascular Disease History: 1 Age Score: 1 Gender Score: 1              Physical Exam:   VS:  BP 126/68 (BP Location: Right Arm, Patient Position: Sitting, Cuff Size: Large)   Pulse 70   Ht 5' 2 (1.575 m)   Wt 202 lb (91.6 kg)   BMI 36.95 kg/m    Wt Readings from Last 3 Encounters:  05/08/24 202 lb (91.6 kg)  04/07/24 204 lb 11.2 oz (92.9 kg)  03/05/24 202 lb 1.6 oz (91.7 kg)    GEN: Well nourished, well developed in no acute distress NECK: No JVD; No carotid bruits CARDIAC: RRR, no murmurs, rubs, gallops RESPIRATORY:  Clear to auscultation without rales, wheezing or rhonchi  ABDOMEN: Soft, non-tender, non-distended EXTREMITIES:  No edema; No acute deformity      Assessment and Plan:  Lower extremity edema Shortness of breath Echocardiogram 12/2022 with LVEF 60 to 65%, no RWMA, normal diastolic parameters, RV normal, mild-moderate MR - She notes ongoing lower extremity swelling typically starting around 3 PM that worsens throughout the afternoon and improves throughout the night with no leg swelling in the morning.  Has chronic unchanged intermittent DOE and has been ongoing for well over 1 year - On exam she has no LEE.  Lungs CTAB.  No orthopnea, PND, weight gain.  - Symptoms fairly consistent  with venous insufficiency and OHS.  Encouraged lower extremity stockings, low-sodium diet, leg elevation, daily physical exercise, and weight loss - Continue spironolactone  50 mg daily  Coronary artery disease Coronary CTA 10/2022 with CAC of 56 (64th percentile) and TPV of 188 (49th percentile) - Today she is stable without chest pains.  No symptoms to suggest active angina.  No indication further ischemic evaluation at this time - No ASA due to chronic anticoagulation - Continue Repatha  140 mg q. 14 days  Paroxysmal atrial fibrillation Atrial flutter S/p cardioversion 07/2022 - Today she appears to maintain NSR by auscultation - Tells me she has had an increase in her paroxysms.  She routinely collects data on her Apple watch and has noted an increase  from 2% to 12% over the past month.  She does note to be fairly symptomatic with the short episodes associated with shortness of breath and fatigue - Plan for 14-day ZIO to determine her atrial fibrillation burden - Referred to EP for consideration of ablation  - Continue Eliquis  5 mg twice daily - Continue metoprolol  succinate 25 mg daily  Mitral valve regurgitation Echocardiogram 12/2022 with mild to moderate mitral valve regurgitation She has chronic shortness of breath is unchanged.  No chest pains, lightheadedness, dizziness, - Will plan for echocardiogram to reevaluate her MR  Hyperlipidemia Her most recent LDL was 140 on 03/2024 however she states that she did miss a dose of Repatha  prior.  She has been adherent to Repatha  since - She will have repeat lipid panel in 2 months - Continue Repatha  140 mg q. 14 days   Hypertension Blood pressure today is well-controlled at 126/68 - Continue amlodipine  5 mg daily      Dispo:  Return in about 3 months (around 08/08/2024).  Signed, Lum LITTIE Louis, NP

## 2024-05-08 NOTE — Progress Notes (Unsigned)
 Enrolled for [employer] to mail a ZIO XT long term holter monitor to the patients address on file.   Dr. Rennis Golden to read.

## 2024-05-26 ENCOUNTER — Encounter: Payer: Self-pay | Admitting: Radiology

## 2024-05-30 ENCOUNTER — Encounter (HOSPITAL_BASED_OUTPATIENT_CLINIC_OR_DEPARTMENT_OTHER): Payer: Self-pay

## 2024-06-02 ENCOUNTER — Ambulatory Visit (INDEPENDENT_AMBULATORY_CARE_PROVIDER_SITE_OTHER)

## 2024-06-02 DIAGNOSIS — R0609 Other forms of dyspnea: Secondary | ICD-10-CM | POA: Diagnosis not present

## 2024-06-02 DIAGNOSIS — I340 Nonrheumatic mitral (valve) insufficiency: Secondary | ICD-10-CM | POA: Diagnosis not present

## 2024-06-02 LAB — ECHOCARDIOGRAM COMPLETE
AR max vel: 1.45 cm2
AV Area VTI: 1.21 cm2
AV Area mean vel: 1.44 cm2
AV Mean grad: 13 mmHg
AV Peak grad: 23.2 mmHg
Ao pk vel: 2.41 m/s
Area-P 1/2: 3.65 cm2
S' Lateral: 2.57 cm

## 2024-06-03 ENCOUNTER — Ambulatory Visit: Payer: Self-pay | Admitting: Emergency Medicine

## 2024-06-05 DIAGNOSIS — I480 Paroxysmal atrial fibrillation: Secondary | ICD-10-CM

## 2024-06-06 MED ORDER — METOPROLOL SUCCINATE ER 50 MG PO TB24: 50.0000 mg | ORAL_TABLET | Freq: Every day | ORAL | 1 refills | Status: AC

## 2024-06-11 ENCOUNTER — Telehealth: Payer: Self-pay | Admitting: Internal Medicine

## 2024-06-11 DIAGNOSIS — E7849 Other hyperlipidemia: Secondary | ICD-10-CM

## 2024-06-11 MED ORDER — REPATHA SURECLICK 140 MG/ML ~~LOC~~ SOAJ: SUBCUTANEOUS | 3 refills | Status: AC

## 2024-06-11 NOTE — Telephone Encounter (Signed)
*  STAT* If patient is at the pharmacy, call can be transferred to refill team.   1. Which medications need to be refilled? (please list name of each medication and dose if known) Evolocumab  (REPATHA  SURECLICK) 140 MG/ML SOAJ    2. Would you like to learn more about the convenience, safety, & potential cost savings by using the [facility]?    3. Are you open to using the [facility] (Type [facility]. ).   4. Which pharmacy/location (including street and city if local pharmacy) is medication to be sent to?  [id number] - [place], [place] - [address]     5. Do they need a 30 day or 90 day supply? 90 day

## 2024-06-29 NOTE — Progress Notes (Unsigned)
 Electrophysiology Office Note:    Date:  06/30/2024   ID:  Alyssa [name], DOB 1950/08/17, MRN [id number]  PCP:  Anita Bernardino BROCKS, FNP   [facility] Providers Cardiologist:  Vinie BROCKS Maxcy, MD Cardiology APP:  Alyssa Lum CROME, NP     Referring MD: Alyssa Lum CROME, NP   History of Present Illness:    Alyssa [name] is a 73 y.o. female with a medical history significant for paroxysmal atrial flutter, coronary artery disease, hypertension, pulmonary embolism 2010, referred for arrhythmia management.      Discussed the use of AI scribe software for clinical note transcription with the patient, who gave verbal consent to proceed.  History of Present Illness  She has had atrial fibrillation for nearly three years and is worried due to a strong family history of serious cardiovascular and cerebrovascular disease. Her mother and grandmother had heart problems at older ages.  She was first seen in our office in 2019 by Dr. Maxcy for dyslipidemia.  She complained of palpitations during an office visit in June 2023.  Apple Watch EKG showed showed atrial fibrillation.  Several Apple Watch tracings are scanned under the EKG tab.  She was started on anticoagulation.  She has had multiple EKGs, including a 12-lead in December 2023 showing atrial flutter and an inconclusive Apple Watch tracing. She recalls an office EKG last year that showed atrial fibrillation, though none have documented it this year.  She describes several episodes of head and body spinning lasting over 45 minutes. Her neurologist did not feel these spells were related to her heart.  She takes Eliquis  twice daily, started in June 2023 for stroke prevention in the setting of atrial flutter. She also has traumatic brain injury with post-concussion syndrome.  Her father died at 85 from stroke, and her brother recently died from a brain bleed and pulmonary embolisms. She has had extremely high cholesterol that was  over 400 and is now controlled with Repatha  injections.  She has marked limitation in physical activity. She can no longer tolerate prior exercise routines and now becomes short of breath with about 30 minutes of yard work, requiring rest.         Today, she reports that she is at baseline and has no complaints.  EKGs/Labs/Other Studies Reviewed Today:     Echocardiogram:  TTE November 2025 LVEF 60 to 75%.  Grade 1 diastolic dysfunction.  Mildly dilated left atrium Carto   Monitors:  14 day monitor October 2025-- my interpretation Sinus rhythm heart rate 47 to 120 bpm, average 72 bpm Multiple episodes of narrow complex tachycardia most likely atrial runs, the longest was 10.6 seconds Multiple symptom episodes, most correlate with sinus rhythm with atrial and ventricular ectopy Total ectopy burden less than 1%.  Advanced imaging:  CT coronary Coronary calcium  score of 56, nonobstructive coronary disease  EKG:   EKG Interpretation Date/Time:  Monday June 30 2024 08:58:14 EST Ventricular Rate:  55 PR Interval:  142 QRS Duration:  82 QT Interval:  434 QTC Calculation: 415 R Axis:   44  Text Interpretation: Sinus bradycardia When compared with ECG of 07-Apr-2024 13:03, No significant change was found Confirmed by Nancey Scotts [phone]) on 06/30/2024 9:05:26 AM     Physical Exam:    VS:  BP 118/70   Pulse (!) 55   Ht 5' 2 (1.575 m)   Wt 204 lb 12.8 oz (92.9 kg)   SpO2 93%   BMI 37.46 kg/m  Wt Readings from Last 3 Encounters:  06/30/24 204 lb 12.8 oz (92.9 kg)  05/08/24 202 lb (91.6 kg)  04/07/24 204 lb 11.2 oz (92.9 kg)     GEN: Well nourished, well developed in no acute distress CARDIAC: RRR, no murmurs, rubs, gallops RESPIRATORY:  Normal work of breathing MUSCULOSKELETAL: no edema    ASSESSMENT & PLAN:     Atrial flutter Documented on EKG July 13, 2022 Multiple Apple Watch tracings show irregular rhythm with an undulating baseline  that I suspect is also flutter No definitive evidence of fibrillation We discussed the management options at length and using shared decision making approach decided to proceed with flutter ablation. I explained specific risks of stroke, heart attack, death, need for pacemaker though these risks are quite low.  There is also risk of bleeding and vascular injury.  She would like to proceed with scheduling the procedure  History of pulmonary embolism On Eliquis  for arrhythmia  Hyperlipidemia On Repatha    Signed, Alyssa FORBES Furbish, MD  06/30/2024 9:25 AM     [facility]

## 2024-06-30 ENCOUNTER — Ambulatory Visit: Attending: Cardiovascular Disease | Admitting: Cardiovascular Disease

## 2024-06-30 ENCOUNTER — Encounter: Payer: Self-pay | Admitting: Cardiovascular Disease

## 2024-06-30 VITALS — BP 118/70 | HR 55 | Ht 62.0 in | Wt 204.8 lb

## 2024-06-30 DIAGNOSIS — I480 Paroxysmal atrial fibrillation: Secondary | ICD-10-CM

## 2024-06-30 NOTE — Addendum Note (Signed)
 Addended by: CASIMIR ALDONA BRAVO on: 06/30/2024 09:37 AM   Modules accepted: Orders

## 2024-06-30 NOTE — Patient Instructions (Signed)
 Medication Instructions:  Your physician recommends that you continue on your current medications as directed. Please refer to the Current Medication list given to you today.  *If you need a refill on your cardiac medications before your next appointment, please call your pharmacy*  Testing/Procedures: Ablation Your physician has recommended that you have an ablation. Catheter ablation is a medical procedure used to treat some cardiac arrhythmias (irregular heartbeats). During catheter ablation, a long, thin, flexible tube is put into a blood vessel in your groin (upper thigh), or neck. This tube is called an ablation catheter. It is then guided to your heart through the blood vessel. Radio frequency waves destroy small areas of heart tissue where abnormal heartbeats may cause an arrhythmia to start.   You are scheduled for Atrial Flutter Ablation on Wednesday, January 21 with Dr. Dr. Nancey. Please arrive at the Main Entrance A at [facility]: [address] [place], [place] [address] at 8:30 AM   What To Expect:  Labs: you will need to have lab work drawn within 30 days of your procedure. Please go to any [facility] location to have these drawn - no appointment is needed.  You will receive procedure instructions either through MyChart or in the mail 4-6 week prior to your procedure.  After your procedure we recommend no driving for 4 days, no lifting over 5 lbs for 7 days, and no work or strenuous activity for 7 days.  Please contact our office at [phone] if you have any questions.    Follow-Up: We will contact you to schedule your post-procedure appointments.

## 2024-07-08 ENCOUNTER — Other Ambulatory Visit: Payer: Self-pay | Admitting: Internal Medicine

## 2024-07-10 ENCOUNTER — Telehealth: Payer: Self-pay

## 2024-07-10 NOTE — Telephone Encounter (Signed)
-----   Message from Nurse Aldona SAUNDERS, RN sent at 06/30/2024  1:27 PM EST ----- Regarding: 01/21 Aflutter 1030 - Mealor Important: list procedure date as first item in subject line, followed by procedure type (e.g., 04/04/24 AFib ablation)  Precert:  MD: Mealor Type of ablation: A-flutter Diagnosis: Aflutter CPT code: A-flutter (06346) Ablation scheduled (date/time): 01/21 1030  Procedure: Aflutter ablation  Added to calendar? Yes Orders entered? No, >30 days before procedure Letter complete? No, >30 days before procedure Scheduled with cath lab? Yes Any medications to hold? Routine Labs ordered (CBC, BMET, PT/INR if on warfarin): Yes Mapping system: CARTO (lab 4 or 6) CARTO/OPAL rep notified? No Cardiac CT needed? No Dye allergy? N/A Pre-meds ordered and instructions given? N/A Letter method: MyChart H&P: 12/8 Device: No  Follow-up:  Cassie/Angel, please schedule Routine.  Covering RN - please send this message to [employer], EP scheduler, EP Scheduling pool, EP [employer], and CT scheduler (Brittany Lynch/Stephanie Mogg), if indicated.

## 2024-07-14 ENCOUNTER — Telehealth: Payer: Self-pay

## 2024-07-14 NOTE — Telephone Encounter (Signed)
 SABRA

## 2024-07-29 ENCOUNTER — Encounter (HOSPITAL_COMMUNITY): Payer: Self-pay

## 2024-07-29 ENCOUNTER — Telehealth (HOSPITAL_COMMUNITY): Payer: Self-pay

## 2024-07-29 NOTE — Telephone Encounter (Signed)
 Spoke with patient to discuss upcoming procedure.   CT: not required.  Labs: to be completed.   Any recent signs of acute illness or been started on antibiotics? No Any new medications started?No  Any missed doses of blood thinner?  No  Advised patient to continue taking Eliquis  (Apixaban ) twice daily without missing any doses. Any medications to hold?  Yes; HOLD: Semaglutide (Ozempic, Rybelsus, Wegovy) for 1 week prior to the procedure. Last dose on or before Tuesday, January 13.  Medication instructions:  On the morning of your procedure DO NOT take any medication., including Eliquis  (Apixaban ) or the procedure may be rescheduled. Nothing to eat or drink after midnight prior to your procedure.  Confirmed patient is scheduled for Atrial Flutter Ablation on Wednesday, January 21 with Dr. Nancey. Instructed patient to arrive at the Main Entrance A at [facility]: [address] [place], [place] [address] and check in at Admitting at 8:30 AM.   Plan to go home the same day, you will only stay overnight if medically necessary. You MUST have a responsible adult to drive you home and MUST be with you the first 24 hours after you arrive home or your procedure could be cancelled.  Informed patient a nurse will call a day before the procedure to confirm arrival time and ensure instructions are followed.  Patient verbalized understanding to all instructions provided and agreed to proceed with procedure.   Advised patient to contact RN Navigator at [phone], to inform of any new medications started after call or concerns prior to procedure.

## 2024-07-29 NOTE — Telephone Encounter (Signed)
 Attempted to reach patient to discuss upcoming procedure, no answer. Left VM for patient to return call.

## 2024-07-30 ENCOUNTER — Encounter: Payer: Self-pay | Admitting: Emergency Medicine

## 2024-07-30 ENCOUNTER — Ambulatory Visit: Admitting: Emergency Medicine

## 2024-07-30 VITALS — BP 122/70 | HR 69 | Ht 62.0 in | Wt 204.0 lb

## 2024-07-30 DIAGNOSIS — R600 Localized edema: Secondary | ICD-10-CM

## 2024-07-30 DIAGNOSIS — I10 Essential (primary) hypertension: Secondary | ICD-10-CM | POA: Diagnosis not present

## 2024-07-30 DIAGNOSIS — I480 Paroxysmal atrial fibrillation: Secondary | ICD-10-CM

## 2024-07-30 DIAGNOSIS — Z79899 Other long term (current) drug therapy: Secondary | ICD-10-CM

## 2024-07-30 DIAGNOSIS — E785 Hyperlipidemia, unspecified: Secondary | ICD-10-CM

## 2024-07-30 DIAGNOSIS — I2510 Atherosclerotic heart disease of native coronary artery without angina pectoris: Secondary | ICD-10-CM

## 2024-07-30 DIAGNOSIS — I350 Nonrheumatic aortic (valve) stenosis: Secondary | ICD-10-CM

## 2024-07-30 LAB — CBC
Hematocrit: 44.6 % (ref 34.0–46.6)
Hemoglobin: 14.5 g/dL (ref 11.1–15.9)
MCH: 29.5 pg (ref 26.6–33.0)
MCHC: 32.5 g/dL (ref 31.5–35.7)
MCV: 91 fL (ref 79–97)
Platelets: 340 x10E3/uL (ref 150–450)
RBC: 4.92 x10E6/uL (ref 3.77–5.28)
RDW: 12.2 % (ref 11.7–15.4)
WBC: 5.1 x10E3/uL (ref 3.4–10.8)

## 2024-07-30 LAB — BASIC METABOLIC PANEL WITH GFR
BUN/Creatinine Ratio: 22 (ref 12–28)
BUN: 22 mg/dL (ref 8–27)
CO2: 26 mmol/L (ref 20–29)
Calcium: 9.9 mg/dL (ref 8.7–10.3)
Chloride: 100 mmol/L (ref 96–106)
Creatinine, Ser: 1.01 mg/dL — ABNORMAL HIGH (ref 0.57–1.00)
Glucose: 86 mg/dL (ref 70–99)
Potassium: 4.7 mmol/L (ref 3.5–5.2)
Sodium: 139 mmol/L (ref 134–144)
eGFR: 59 mL/min/1.73 — ABNORMAL LOW

## 2024-07-30 LAB — LIPID PANEL
Chol/HDL Ratio: 5.4 ratio — ABNORMAL HIGH (ref 0.0–4.4)
Cholesterol, Total: 248 mg/dL — ABNORMAL HIGH (ref 100–199)
HDL: 46 mg/dL
LDL Chol Calc (NIH): 164 mg/dL — ABNORMAL HIGH (ref 0–99)
Triglycerides: 207 mg/dL — ABNORMAL HIGH (ref 0–149)
VLDL Cholesterol Cal: 38 mg/dL (ref 5–40)

## 2024-07-30 MED ORDER — CHLORTHALIDONE 12.5 MG PO TABS: 12.5000 mg | ORAL_TABLET | Freq: Every day | ORAL | 3 refills | Status: AC

## 2024-07-30 NOTE — Patient Instructions (Addendum)
 Medication Instructions:  STOP TAKING AMLODIPINE . START TAKING CHLORTHALIDONE  12.5 MG DAILY.  Lab Work: BMET, CBC, AND FASTING LIPID PANEL TO BE DONE TODAY. BMET TO BE DONE IN 2 WEEKS.  Testing/Procedures: NONE  Follow-Up: At [facility], you and your health needs are our priority.  As part of our continuing mission to provide you with exceptional heart care, our providers are all part of one team.  This team includes your primary Cardiologist (physician) and Advanced Practice Providers or APPs (Physician Assistants and Nurse Practitioners) who all work together to provide you with the care you need, when you need it.  Your next appointment:   3 MONTHS  Provider:   MADISON FOUNTAIN, NP

## 2024-07-30 NOTE — Progress Notes (Signed)
 " Cardiology Office Note:    Date:  07/30/2024  ID:  Alyssa [name], DOB [birth year], MRN [id number] PCP: Anita Bernardino BROCKS, FNP  [facility] Providers Cardiologist:  Vinie BROCKS Maxcy, MD Cardiology APP:  Rana Lum CROME, NP       Patient Profile:       Chief Complaint: 59-month follow-up History of Present Illness:  Alyssa [name] is a 74 y.o. female with visit-pertinent history of pulmonary embolism in 2010, breast cancer, atrial fibrillation, hypertension, GERD, aortic valve stenosis   PAF/Aflutter. Onset June 2023, started on anticoagulation.  Cardioversion 07/26/2022.  Echo 04/25/2022: EF 55-60%. Mild LAE. Mild MR.  Hypertension.  Coronary artery disease Hyperlipidemia.  Lipid panel 12/22/2021: LDL 41, HDL 54, TG 104, total 114.  LPa 07/13/2022: 158.1.  PE 2010.  Breast CA.  Mitral valve regurgitation Lower extremity edema Aortic valve stenosis   She was first evaluated by Dr. Maxcy on 06/14/2018 for dyslipidemia. At an office visit with Dr. Maxcy on 01/04/2022 patient noted occasional palpitations with unexplained increase in heart rate. Apple Watch suggested afib. Apple Watch EKGs were evaluated and confirmed Afib. Patient was started on anticoagulation. Patient was last seen in the office on 07/13/2022 by Dr. Maxcy. At that time she was noted to be hypertensive and EKG showed new onset aflutter with variable ventricular response. She had no cardiac awareness of dysrhythmia but did admit to increased fatigue. She has been compliant with anticoagulation since June. She underwent successful cardioversion on 07/26/2022.    Echocardiogram 04/25/2022 showed LVEF 55 to 60%, no RWMA, diastolic parameters are normal, RV function normal, left atrial size mildly dilated, mild mitral valve regurgitation, mild aortic stenosis.  Coronary CTA 11/10/2022 with coronary calcium  score of 56 (64th percentile (and total plaque volume of 188 (49th percentile) while 25-49% stenosis in mid RCA and OM1 and  minimal 0-24% stenosis in left main, mid LAD, proximal LCx, mid LCx and proximal RCA.  Echocardiogram 01/19/2023 showed LVEF 60 to 65%, no RWMA, normal diastolic parameters, RV function and size normal, left atrial size mildly dilated, mild to moderate mitral valve regurgitation, aortic valve mean gradient 12 mmHg.   Seen in clinic on 04/07/2024 by Dr. Maxcy.  She noted to be having some worsening leg edema and noted her blood pressures to be elevated.  She noted that her watch indicated that she is up to 13% burden of A-fib.  Her spironolactone  was increased to 50 mg daily.  She was also to increase her metoprolol  to 25 mg daily.  Seen in clinic on 02/06/2024.  Noted increase in atrial fibrillation burden over the past month.  She was referred to EP to consider ablation.  Zio 04/2024 showed average heart rate 72 bpm, 1 run of ventricular tachycardia occurred lasting 5 beats with max rate of 203 bpm, 21 supraventricular tachycardia runs occurred, the run with the fastest interval lasting 15 beats with a max rate of 154 bpm, no atrial fibrillation.  Echocardiogram 05/2024 showed LVEF 65 to 70%, no RWMA, grade 1 DD, RV function and size normal, normal PASP, trivial MR, mild aortic valve stenosis.   Discussed the use of AI scribe software for clinical note transcription with the patient, who gave verbal consent to proceed.  History of Present Illness Alyssa [name] is a 74 year old female with hypertension, atrial fibrillation, and lower extremity edema who presents with worsening leg and ankle swelling.  She reports leg and ankle swelling now occurring almost daily instead of weekly.  It improves overnight and worsens through the day, especially by afternoon. Compression hose lessen swelling when worn.  Swelling limits afternoon activities at home and requires leg elevation.  She takes spironolactone  50 mg daily, metoprolol  50 mg daily, amlodipine  5 mg twice daily for blood pressure, Ozempic for blood sugar,  and Repatha  for cholesterol.  She has intermittent episodes of a head sensation described as a whoosh, not true dizziness, sometimes severe enough that she pulls off the road while driving. She has not identified triggers.  Feels this is probably related to vertigo.  She has hypertension that was previously difficult to control and has NASH with cirrhosis. She developed rash with losartan and other ARBs and was on hydrochlorothiazide  for over 3 until it no longer controlled her blood pressure.  She works as an print production planner, uses a standing desk, and walks frequently during the day, which may increase daytime dependent leg swelling.  She denies chest pains, dyspnea, orthopnea, PND, syncope, presyncope, lightheadedness, dizziness  Review of systems:  Please see the history of present illness. All other systems are reviewed and otherwise negative.      Studies Reviewed:        ZIO 05/08/2024 Patch Wear Time:  13 days and 9 hours (2025-10-23T07:53:18-0400 to 2025-11-05T17:04:22-0500)   Patient had a min HR of 47 bpm, max HR of 203 bpm, and avg HR of 72 bpm. Predominant underlying rhythm was Sinus Rhythm. 1 run of Ventricular Tachycardia occurred lasting 5 beats with a max rate of 203 bpm (avg 187 bpm). 21 Supraventricular Tachycardia  runs occurred, the run with the fastest interval lasting 15 beats with a max rate of 154 bpm, the longest lasting 20 beats with an avg rate of 117 bpm. Some episodes of Supraventricular Tachycardia may be possible Atrial Tachycardia with variable block.  Supraventricular Tachycardia was detected within +/- 45 seconds of symptomatic patient event(s). Isolated SVEs were rare (<1.0%), SVE Couplets were rare (<1.0%), and SVE Triplets were rare (<1.0%). Isolated VEs were rare (<1.0%), and no VE Couplets or VE  Triplets were present. Ventricular Bigeminy was present.   Monitor shows no afib. There was a 5 beat run of NSVT. 21 runs of PSVT noted lasting up to 20  beats, may be causing symptomatic palpitations given that the SVT was patient triggered.  Echocardiogram 06/02/2024 1. Left ventricular ejection fraction, by estimation, is 65 to 70%. Left  ventricular ejection fraction by 3D volume is 70 %. The left ventricle has  normal function. The left ventricle demonstrates regional wall motion  abnormalities (see scoring  diagram/findings for description). Left ventricular diastolic parameters  are consistent with Grade I diastolic dysfunction (impaired relaxation).  The average left ventricular global longitudinal strain is -12.8 %. The  global longitudinal strain is  abnormal.   2. Right ventricular systolic function is low normal. The right  ventricular size is normal. There is normal pulmonary artery systolic  pressure.   3. Left atrial size was mildly dilated.   4. The mitral valve is normal in structure. Trivial mitral valve  regurgitation. No evidence of mitral stenosis.   5. The aortic valve is calcified. Aortic valve regurgitation is not  visualized. Mild aortic valve stenosis. Aortic valve area, by VTI measures  1.21 cm. Aortic valve mean gradient measures 13.0 mmHg. Aortic valve Vmax  measures 2.41 m/s.   6. The inferior vena cava is normal in size with greater than 50%  respiratory variability, suggesting right atrial pressure of 3 mmHg.   7.  Cannot exclude a small PFO.   Echocardiogram 01/19/2023  1. Left ventricular ejection fraction, by estimation, is 60 to 65%. The  left ventricle has normal function. The left ventricle has no regional  wall motion abnormalities. Left ventricular diastolic parameters were  normal.   2. Right ventricular systolic function is normal. The right ventricular  size is normal. There is normal pulmonary artery systolic pressure. The  estimated right ventricular systolic pressure is 28.0 mmHg.   3. Left atrial size was mildly dilated.   4. The mitral valve is grossly normal. Mild to moderate mitral  valve  regurgitation.   5. The aortic valve is tricuspid. There is moderate calcification of the  aortic valve. There is moderate thickening of the aortic valve. Aortic  valve regurgitation is not visualized. Aortic valve mean gradient measures  12.0 mmHg. Aortic valve Vmax  measures 2.24 m/s.   6. The inferior vena cava is normal in size with greater than 50%  respiratory variability, suggesting right atrial pressure of 3 mmHg.    Coronary CTA 11/10/2022 1. Coronary calcium  score of 56. This was 64th percentile for age and sex matched control.   2. Total plaque volume 110mm3 which is 49th percentile for age and sex-matched controls (calcified plaque 47mm3; noncalcified plaque 171mm3). TPV is moderate   3.  Normal coronary origin with right dominance.   4.  Nonobstructive CAD   5.  Mild (25-49%) stenosis in mid RCA and OM1   6. Minimal (0-24%) stenosis in left main, mid LAD, proximal LCX, mid LCX, and proximal RCA   CAD-RADS 2. Mild non-obstructive CAD (25-49%). Consider non-atherosclerotic causes of chest pain. Consider preventive therapy and risk factor modification.  Risk Assessment/Calculations:    CHA2DS2-VASc Score = 5   This indicates a 7.2% annual risk of stroke. The patient's score is based upon: CHF History: 0 HTN History: 1 Diabetes History: 1 Stroke History: 0 Vascular Disease History: 1 Age Score: 1 Gender Score: 1             Physical Exam:   VS:  BP 122/70 (BP Location: Left Arm, Patient Position: Sitting, Cuff Size: Large)   Pulse 69   Ht 5' 2 (1.575 m)   Wt 204 lb (92.5 kg)   BMI 37.31 kg/m    Wt Readings from Last 3 Encounters:  07/30/24 204 lb (92.5 kg)  06/30/24 204 lb 12.8 oz (92.9 kg)  05/08/24 202 lb (91.6 kg)    GEN: Well nourished, well developed in no acute distress NECK: No JVD; No carotid bruits CARDIAC: RRR.  1/6 systolic murmur.  No rubs, gallops RESPIRATORY:  Clear to auscultation without rales, wheezing or rhonchi   ABDOMEN: Soft, non-tender, non-distended EXTREMITIES:  No edema; No acute deformity      Assessment and Plan:  Lower extremity edema Shortness of breath Echocardiogram 05/2024 with LVEF 65 to 70%, grade 1 DD, RV normal, mild AS.  Echo read out as basal inferior segment is hypokinetic however on further review by Dr. Mona there is no regional wall motion abnormalities noted - Today she reports worsening of her lower extremity swelling which now occurs almost daily instead of weekly.  It improves overnight and worsens throughout the day.  Compression hose seem to help.  Reports swelling does limit her afternoon activities and is lifestyle limiting.  No changes to chronic shortness of breath - On exam no LEE this morning and lungs CTAB.  She denies any orthopnea, PND, recent weight gain. - Her recent  echo was reassuring and her symptoms seem to be consistent with possible amlodipine  side effects, venous insufficiency or OHS - There is a possibility that amlodipine  could be contributing to her lower extremity edema.  She is currently taking amlodipine  5 mg in the a.m. and p.m. we discussed possible alternatives which she would like to trial off of amlodipine  at this time.  Will plan to discontinue amlodipine  today and transition to chlorthalidone  12.5 mg daily to see if this improves her edema.  It was reported she experienced an array of side effects from various medications in the past and chlorthalidone  was stopped in 2022 along with other medications.  Patient is willing to retry at this time - Continue spironolactone  50 mg daily - BMET x 2 weeks   Coronary artery disease Coronary CTA 10/2022 with CAC of 56 (64th percentile) and TPV of 188 (49th percentile) Echocardiogram 05/2024 with LVEF 65 to 70% - Today she is stable without chest pains or exertional symptoms.  No symptoms to suggest active angina.  No indication further ischemic evaluation at this time - No ASA due to chronic  anticoagulation - Continue Repatha  140 mg q. 14 days   Paroxysmal atrial fibrillation Atrial flutter S/p cardioversion 07/2022 Zio 05/2024 showed no episodes atrial fibrillation Currently pending atrial fibrillation ablation on 08/18/2024 with Dr. Mealor - Continue Eliquis  5 mg twice daily - Continue metoprolol  succinate 25 mg daily - BMET and CBC today   Aortic valve stenosis Echocardiogram 05/2024 showed mild aortic valve stenosis with a mean gradient of 13 mmHg and trivial mitral valve regurgitation - No interventions warranted at this time - Will repeat echocardiogram in 2 years for routine monitoring   Hyperlipidemia, LDL goal <70 Her most recent LDL was 140 on 03/2024, at that time she had missed her dosing of Repatha  and has since been adherent - Plan for repeat fasting lipid panel today - Continue Repatha  140 mg q. 14 days    Hypertension Blood pressure today is well-controlled at 126/68 - Experiencing LEE on amlodipine .  Will transition to chlorthalidone  12.5 mg daily - Continue spironolactone  50 mg daily - BMET x 2 weeks      Dispo:  Return in about 3 months (around 10/28/2024).  Signed, Lum LITTIE Louis, NP  "

## 2024-07-31 ENCOUNTER — Ambulatory Visit: Payer: Self-pay | Admitting: Emergency Medicine

## 2024-07-31 ENCOUNTER — Encounter: Payer: Self-pay | Admitting: Emergency Medicine

## 2024-08-01 ENCOUNTER — Ambulatory Visit: Payer: Self-pay

## 2024-08-12 NOTE — Pre-Procedure Instructions (Signed)
 Attempted to call patient regarding procedure instructions.  Left voicemail on the following items: Arrival time 0830 Nothing to eat or drink after midnight No meds AM of procedure Responsible person to drive you home and stay with you for 24 hrs  Have you missed any doses of anti-coagulant Eliquis - should be taken twice a day, if you have missed any doses please let us  know.  Don't take dose morning of procedure.

## 2024-08-13 ENCOUNTER — Other Ambulatory Visit: Payer: Self-pay

## 2024-08-13 ENCOUNTER — Ambulatory Visit (HOSPITAL_BASED_OUTPATIENT_CLINIC_OR_DEPARTMENT_OTHER): Payer: Self-pay | Admitting: Anesthesiology

## 2024-08-13 ENCOUNTER — Encounter (HOSPITAL_COMMUNITY): Payer: Self-pay | Admitting: Cardiovascular Disease

## 2024-08-13 ENCOUNTER — Ambulatory Visit (HOSPITAL_COMMUNITY)
Admission: RE | Admit: 2024-08-13 | Discharge: 2024-08-13 | Disposition: A | Discharge status: Home / Self Care | Attending: Cardiovascular Disease | Admitting: Cardiovascular Disease

## 2024-08-13 ENCOUNTER — Encounter (HOSPITAL_COMMUNITY): Payer: Self-pay | Admitting: Anesthesiology

## 2024-08-13 ENCOUNTER — Encounter (HOSPITAL_COMMUNITY)
Admission: RE | Disposition: A | Discharge status: Home / Self Care | Payer: Self-pay | Source: Home / Self Care | Attending: Cardiovascular Disease

## 2024-08-13 DIAGNOSIS — I4892 Unspecified atrial flutter: Secondary | ICD-10-CM | POA: Insufficient documentation

## 2024-08-13 DIAGNOSIS — I10 Essential (primary) hypertension: Secondary | ICD-10-CM | POA: Insufficient documentation

## 2024-08-13 DIAGNOSIS — Z79899 Other long term (current) drug therapy: Secondary | ICD-10-CM | POA: Insufficient documentation

## 2024-08-13 DIAGNOSIS — E785 Hyperlipidemia, unspecified: Secondary | ICD-10-CM | POA: Diagnosis not present

## 2024-08-13 DIAGNOSIS — E1151 Type 2 diabetes mellitus with diabetic peripheral angiopathy without gangrene: Secondary | ICD-10-CM | POA: Diagnosis not present

## 2024-08-13 DIAGNOSIS — I493 Ventricular premature depolarization: Secondary | ICD-10-CM | POA: Insufficient documentation

## 2024-08-13 DIAGNOSIS — Z823 Family history of stroke: Secondary | ICD-10-CM | POA: Insufficient documentation

## 2024-08-13 DIAGNOSIS — K219 Gastro-esophageal reflux disease without esophagitis: Secondary | ICD-10-CM | POA: Insufficient documentation

## 2024-08-13 DIAGNOSIS — Z7985 Long-term (current) use of injectable non-insulin antidiabetic drugs: Secondary | ICD-10-CM | POA: Insufficient documentation

## 2024-08-13 DIAGNOSIS — E039 Hypothyroidism, unspecified: Secondary | ICD-10-CM | POA: Insufficient documentation

## 2024-08-13 DIAGNOSIS — I2510 Atherosclerotic heart disease of native coronary artery without angina pectoris: Secondary | ICD-10-CM | POA: Diagnosis not present

## 2024-08-13 DIAGNOSIS — Z86711 Personal history of pulmonary embolism: Secondary | ICD-10-CM | POA: Insufficient documentation

## 2024-08-13 DIAGNOSIS — I4891 Unspecified atrial fibrillation: Secondary | ICD-10-CM | POA: Diagnosis not present

## 2024-08-13 DIAGNOSIS — Z7901 Long term (current) use of anticoagulants: Secondary | ICD-10-CM | POA: Insufficient documentation

## 2024-08-13 DIAGNOSIS — Z853 Personal history of malignant neoplasm of breast: Secondary | ICD-10-CM | POA: Insufficient documentation

## 2024-08-13 HISTORY — PX: A-FLUTTER ABLATION: EP1230

## 2024-08-13 HISTORY — DX: Unspecified atrial flutter: I48.92

## 2024-08-13 LAB — GLUCOSE, CAPILLARY
Glucose-Capillary: 117 mg/dL — ABNORMAL HIGH (ref 70–99)
Glucose-Capillary: 91 mg/dL (ref 70–99)

## 2024-08-13 MED ORDER — SODIUM CHLORIDE 0.9 % IV SOLN: INTRAVENOUS | Status: DC

## 2024-08-13 MED ORDER — LIDOCAINE 2% (20 MG/ML) 5 ML SYRINGE
INTRAMUSCULAR | Status: DC | PRN
  Administered 2024-08-13: 100 mg via INTRAVENOUS

## 2024-08-13 MED ORDER — SODIUM CHLORIDE 0.9% FLUSH: 3.0000 mL | Freq: Two times a day (BID) | INTRAVENOUS | Status: DC

## 2024-08-13 MED ORDER — SODIUM CHLORIDE 0.9% FLUSH: 3.0000 mL | INTRAVENOUS | Status: DC | PRN

## 2024-08-13 MED ORDER — FENTANYL CITRATE (PF) 250 MCG/5ML IJ SOLN
INTRAMUSCULAR | Status: DC | PRN
  Administered 2024-08-13: 50 ug via INTRAVENOUS

## 2024-08-13 MED ORDER — ONDANSETRON HCL 4 MG/2ML IJ SOLN: 4.0000 mg | Freq: Four times a day (QID) | INTRAMUSCULAR | Status: DC | PRN

## 2024-08-13 MED ORDER — PROPOFOL 10 MG/ML IV BOLUS
INTRAVENOUS | Status: DC | PRN
  Administered 2024-08-13: 50 mg via INTRAVENOUS

## 2024-08-13 MED ORDER — PROPOFOL 500 MG/50ML IV EMUL
INTRAVENOUS | Status: DC | PRN
  Administered 2024-08-13: 125 ug/kg/min via INTRAVENOUS

## 2024-08-13 MED ORDER — PHENYLEPHRINE 80 MCG/ML (10ML) SYRINGE FOR IV PUSH (FOR BLOOD PRESSURE SUPPORT)
PREFILLED_SYRINGE | INTRAVENOUS | Status: DC | PRN
  Administered 2024-08-13: 80 ug via INTRAVENOUS

## 2024-08-13 MED ORDER — SUGAMMADEX SODIUM 200 MG/2ML IV SOLN
INTRAVENOUS | Status: DC | PRN
  Administered 2024-08-13: 300 mg via INTRAVENOUS

## 2024-08-13 MED ORDER — ACETAMINOPHEN 325 MG PO TABS: 650.0000 mg | ORAL_TABLET | ORAL | Status: DC | PRN

## 2024-08-13 MED ORDER — SODIUM CHLORIDE 0.9 % IV SOLN: 250.0000 mL | INTRAVENOUS | Status: DC | PRN

## 2024-08-13 MED ORDER — HEPARIN (PORCINE) IN NACL 1000-0.9 UT/500ML-% IV SOLN
INTRAVENOUS | Status: DC | PRN
  Administered 2024-08-13 (×3): 500 mL

## 2024-08-13 MED ORDER — ROCURONIUM BROMIDE 10 MG/ML (PF) SYRINGE
PREFILLED_SYRINGE | INTRAVENOUS | Status: DC | PRN
  Administered 2024-08-13: 60 mg via INTRAVENOUS

## 2024-08-13 MED ORDER — PHENYLEPHRINE HCL-NACL 20-0.9 MG/250ML-% IV SOLN
INTRAVENOUS | Status: DC | PRN
  Administered 2024-08-13: 25 ug/min via INTRAVENOUS

## 2024-08-13 NOTE — Anesthesia Postprocedure Evaluation (Signed)
"   Anesthesia Post Note  Patient: Alyssa [name]  Procedure(s) Performed: A-FLUTTER ABLATION     Patient location during evaluation: Cath Lab Anesthesia Type: General Level of consciousness: awake Pain management: pain level controlled Vital Signs Assessment: post-procedure vital signs reviewed and stable Respiratory status: spontaneous breathing Cardiovascular status: blood pressure returned to baseline Postop Assessment: no apparent nausea or vomiting Anesthetic complications: no   No notable events documented.               Lauraine DASEN Colhoun      "

## 2024-08-13 NOTE — Progress Notes (Signed)
 Patient and patient husband given discharge instructions, education provided no further questions at this time. Patient able to ambulate and void before discharge. Able to tolerate PO intake. Patient site is clean, dry, intact with no hematoma noted upon discharge. Seen by MD.

## 2024-08-13 NOTE — Anesthesia Preprocedure Evaluation (Addendum)
 "                                  Anesthesia Evaluation  Patient identified by MRN, date of birth, ID band Patient awake    Reviewed: Allergy & Precautions, NPO status , Patient's Chart, lab work & pertinent test results, reviewed documented beta blocker date and time   History of Anesthesia Complications Negative for: history of anesthetic complications  Airway Mallampati: II  TM Distance: >3 FB Neck ROM: Full    Dental  (+) Teeth Intact, Dental Advisory Given   Pulmonary asthma , PE (2013)   breath sounds clear to auscultation       Cardiovascular hypertension, Pt. on medications and Pt. on home beta blockers + Peripheral Vascular Disease  + Valvular Problems/Murmurs AS  Rhythm:Irregular Rate:Normal + Systolic murmurs  TTE (05/2024): 1. Left ventricular ejection fraction, by estimation, is 65 to 70%. Left  ventricular ejection fraction by 3D volume is 70 %. The left ventricle has  normal function. The left ventricle demonstrates regional wall motion  abnormalities (see scoring diagram/findings for description). Left ventricular diastolic parameters are consistent with Grade I diastolic dysfunction (impaired relaxation).  The average left ventricular global longitudinal strain is -12.8 %. The  global longitudinal strain is abnormal.   2. Right ventricular systolic function is low normal. The right  ventricular size is normal. There is normal pulmonary artery systolic  pressure.   3. Left atrial size was mildly dilated.   4. The mitral valve is normal in structure. Trivial mitral valve  regurgitation. No evidence of mitral stenosis.   5. The aortic valve is calcified. Aortic valve regurgitation is not  visualized. Mild aortic valve stenosis. Aortic valve area, by VTI measures  1.21 cm. Aortic valve mean gradient measures 13.0 mmHg. Aortic valve Vmax  measures 2.41 m/s.   6. The inferior vena cava is normal in size with greater than 50%  respiratory  variability, suggesting right atrial pressure of 3 mmHg.   7. Cannot exclude a small PFO.     Neuro/Psych  Headaches  Anxiety        GI/Hepatic ,GERD  Medicated and Controlled,, NASH     Endo/Other  diabetes, Type 2Hypothyroidism (s/p Thyroidectomy)   Ozempic (last dose 08/05/24)   Renal/GU      Musculoskeletal  (+) Arthritis ,   S/p ACDF   Abdominal   Peds  Hematology   Anesthesia Other Findings   Reproductive/Obstetrics  Hx of Breast Cancer                               Anesthesia Physical Anesthesia Plan  ASA: 3  Anesthesia Plan: General   Post-op Pain Management:    Induction: Intravenous  PONV Risk Score and Plan: 2 and Ondansetron , Dexamethasone  and Treatment may vary due to age or medical condition  Airway Management Planned: Oral ETT and Video Laryngoscope Planned  Additional Equipment: None  Intra-op Plan:   Post-operative Plan: Extubation in OR  Informed Consent: I have reviewed the patients History and Physical, chart, labs and discussed the procedure including the risks, benefits and alternatives for the proposed anesthesia with the patient or authorized representative who has indicated his/her understanding and acceptance.     Dental advisory given  Plan Discussed with: CRNA  Anesthesia Plan Comments:          Anesthesia  Quick Evaluation  "

## 2024-08-13 NOTE — H&P (Signed)
 " Electrophysiology Office Note:    Date:  08/13/2024   ID:  Alyssa [name], DOB 1950-11-23, MRN [id number]  PCP:  Anita Bernardino BROCKS, FNP    [facility] Providers Cardiologist:  Vinie BROCKS Maxcy, MD Cardiology APP:  Rana Lum CROME, NP     Referring MD: No ref. provider found   History of Present Illness:    Alyssa [name] is a 74 y.o. female with a medical history significant for paroxysmal atrial flutter, coronary artery disease, hypertension, pulmonary embolism 2010, referred for arrhythmia management.      Discussed the use of AI scribe software for clinical note transcription with the patient, who gave verbal consent to proceed.  History of Present Illness  She has had atrial fibrillation for nearly three years and is worried due to a strong family history of serious cardiovascular and cerebrovascular disease. Her mother and grandmother had heart problems at older ages.  She was first seen in our office in 2019 by Dr. Maxcy for dyslipidemia.  She complained of palpitations during an office visit in June 2023.  Apple Watch EKG showed showed atrial fibrillation.  Several Apple Watch tracings are scanned under the EKG tab.  She was started on anticoagulation.  She has had multiple EKGs, including a 12-lead in December 2023 showing atrial flutter and an inconclusive Apple Watch tracing. She recalls an office EKG last year that showed atrial fibrillation, though none have documented it this year.  She describes several episodes of head and body spinning lasting over 45 minutes. Her neurologist did not feel these spells were related to her heart.  She takes Eliquis  twice daily, started in June 2023 for stroke prevention in the setting of atrial flutter. She also has traumatic brain injury with post-concussion syndrome.  Her father died at 67 from stroke, and her brother recently died from a brain bleed and pulmonary embolisms. She has had extremely high cholesterol that was  over 400 and is now controlled with Repatha  injections.  She has marked limitation in physical activity. She can no longer tolerate prior exercise routines and now becomes short of breath with about 30 minutes of yard work, requiring rest.         Today, she reports that she is at baseline and has no complaints. She presents today for ablation of typical-appearing atrial flutter. There has been no definitive documentation of atrial fibrillation. No recent changes in her problems or diagnoses.  EKGs/Labs/Other Studies Reviewed Today:     Echocardiogram:  TTE November 2025 LVEF 60 to 75%.  Grade 1 diastolic dysfunction.  Mildly dilated left atrium Carto   Monitors:  14 day monitor October 2025-- my interpretation Sinus rhythm heart rate 47 to 120 bpm, average 72 bpm Multiple episodes of narrow complex tachycardia most likely atrial runs, the longest was 10.6 seconds Multiple symptom episodes, most correlate with sinus rhythm with atrial and ventricular ectopy Total ectopy burden less than 1%.  Advanced imaging:  CT coronary Coronary calcium  score of 56, nonobstructive coronary disease  EKG:         Physical Exam:    VS:  BP 129/81   Pulse (!) 57   Temp 97.7 F (36.5 C) (Oral)   Resp 18   Ht 5' 2 (1.575 m)   Wt 89.8 kg   SpO2 95%   BMI 36.21 kg/m     Wt Readings from Last 3 Encounters:  08/13/24 89.8 kg  07/30/24 92.5 kg  06/30/24 92.9 kg  GEN: Well nourished, well developed in no acute distress CARDIAC: RRR, no murmurs, rubs, gallops RESPIRATORY:  Normal work of breathing MUSCULOSKELETAL: no edema    ASSESSMENT & PLAN:     Atrial flutter Documented on EKG July 13, 2022 Multiple Apple Watch tracings show irregular rhythm with an undulating baseline that I suspect is also flutter No definitive evidence of fibrillation We discussed the management options at length and using shared decision making approach decided to proceed with flutter  ablation. I explained specific risks of stroke, heart attack, death, need for pacemaker though these risks are quite low.  There is also risk of bleeding and vascular injury.  She would like to proceed with scheduling the procedure  History of pulmonary embolism On Eliquis  for arrhythmia  Hyperlipidemia On Repatha    Signed, Eulas FORBES Furbish, MD  08/13/2024 10:50 AM    [facility]  "

## 2024-08-13 NOTE — Discharge Instructions (Signed)

## 2024-08-13 NOTE — Anesthesia Procedure Notes (Addendum)
 Procedure Name: Intubation Date/Time: 08/13/2024 11:36 AM  Performed by: Mollie Olivia SAUNDERS, CRNAPre-anesthesia Checklist: Patient identified, Emergency Drugs available, Suction available, Patient being monitored and Timeout performed Patient Re-evaluated:Patient Re-evaluated prior to induction Oxygen Delivery Method: Circle system utilized Preoxygenation: Pre-oxygenation with 100% oxygen Induction Type: IV induction Ventilation: Mask ventilation without difficulty Laryngoscope Size: Glidescope and 3 Grade View: Grade II Tube size: 7.0 mm Number of attempts: 1 Airway Equipment and Method: Video-laryngoscopy and Rigid stylet Placement Confirmation: ETT inserted through vocal cords under direct vision, positive ETCO2 and breath sounds checked- equal and bilateral Secured at: 21 cm Tube secured with: Tape Dental Injury: Teeth and Oropharynx as per pre-operative assessment  Difficulty Due To: Difficult Airway- due to reduced neck mobility and Difficult Airway- due to limited oral opening

## 2024-08-13 NOTE — Transfer of Care (Signed)
 Immediate Anesthesia Transfer of Care Note  Patient: Alyssa [name]  Procedure(s) Performed: A-FLUTTER ABLATION  Patient Location: Cath Lab  Anesthesia Type:General  Level of Consciousness: awake, alert , drowsy, and patient cooperative  Airway & Oxygen Therapy: Patient Spontanous Breathing and Patient connected to nasal cannula oxygen  Post-op Assessment: Report given to RN and Post -op Vital signs reviewed and stable  Post vital signs: Reviewed and stable  Last Vitals:  Vitals Value Taken Time  BP    Temp    Pulse    Resp    SpO2      Last Pain:  Vitals:   08/13/24 0921  TempSrc: Oral  PainSc:          Complications: No notable events documented.

## 2024-08-14 ENCOUNTER — Telehealth (HOSPITAL_COMMUNITY): Payer: Self-pay

## 2024-08-14 ENCOUNTER — Encounter (HOSPITAL_COMMUNITY): Payer: Self-pay | Admitting: Cardiovascular Disease

## 2024-08-14 NOTE — Telephone Encounter (Signed)
 Attempted to reach patient to follow up with procedure completed on 08/14/23, no answer. Left VM for patient to return call.

## 2024-08-26 ENCOUNTER — Encounter: Payer: Self-pay | Admitting: Cardiovascular Disease

## 2024-09-09 ENCOUNTER — Ambulatory Visit: Admitting: Adult Health

## 2024-09-15 ENCOUNTER — Ambulatory Visit: Admitting: Student

## 2024-09-18 ENCOUNTER — Ambulatory Visit: Admitting: Student

## 2024-10-28 ENCOUNTER — Ambulatory Visit: Admitting: Emergency Medicine
